# Patient Record
Sex: Male | Born: 1945 | Race: White | Hispanic: No | Marital: Single | Smoking: Former smoker
Health system: Southern US, Community
[De-identification: ages and names within clinical notes are randomized; demographics above are authoritative.]

## PROBLEM LIST (undated history)

## (undated) ENCOUNTER — Emergency Department (HOSPITAL_COMMUNITY): Discharge status: Against Medical Advice | Payer: Medicare Other

## (undated) DIAGNOSIS — F418 Other specified anxiety disorders: Secondary | ICD-10-CM

## (undated) DIAGNOSIS — R269 Unspecified abnormalities of gait and mobility: Secondary | ICD-10-CM

## (undated) DIAGNOSIS — N183 Chronic kidney disease, stage 3 (moderate): Secondary | ICD-10-CM

## (undated) DIAGNOSIS — R569 Unspecified convulsions: Secondary | ICD-10-CM

## (undated) DIAGNOSIS — E785 Hyperlipidemia, unspecified: Secondary | ICD-10-CM

## (undated) DIAGNOSIS — E1149 Type 2 diabetes mellitus with other diabetic neurological complication: Secondary | ICD-10-CM

## (undated) DIAGNOSIS — H9190 Unspecified hearing loss, unspecified ear: Secondary | ICD-10-CM

## (undated) DIAGNOSIS — IMO0001 Reserved for inherently not codable concepts without codable children: Secondary | ICD-10-CM

## (undated) DIAGNOSIS — D649 Anemia, unspecified: Secondary | ICD-10-CM

## (undated) DIAGNOSIS — F10239 Alcohol dependence with withdrawal, unspecified: Secondary | ICD-10-CM

## (undated) DIAGNOSIS — I10 Essential (primary) hypertension: Secondary | ICD-10-CM

## (undated) DIAGNOSIS — F1010 Alcohol abuse, uncomplicated: Secondary | ICD-10-CM

## (undated) HISTORY — DX: Unspecified hearing loss, unspecified ear: H91.90

## (undated) HISTORY — DX: Unspecified abnormalities of gait and mobility: R26.9

## (undated) HISTORY — DX: Unspecified convulsions: R56.9

## (undated) HISTORY — DX: Reserved for inherently not codable concepts without codable children: IMO0001

## (undated) HISTORY — DX: Hyperlipidemia, unspecified: E78.5

## (undated) HISTORY — PX: NO PAST SURGERIES: SHX2092

## (undated) HISTORY — DX: Alcohol abuse, uncomplicated: F10.10

---

## 2001-05-06 ENCOUNTER — Ambulatory Visit (HOSPITAL_COMMUNITY): Admission: RE | Admit: 2001-05-06 | Discharge: 2001-05-06 | Payer: Self-pay | Admitting: Internal Medicine

## 2001-05-06 ENCOUNTER — Encounter: Payer: Self-pay | Admitting: Internal Medicine

## 2001-05-06 ENCOUNTER — Encounter: Admission: RE | Admit: 2001-05-06 | Discharge: 2001-05-06 | Payer: Self-pay | Admitting: Internal Medicine

## 2003-10-09 ENCOUNTER — Emergency Department (HOSPITAL_COMMUNITY): Admission: AD | Admit: 2003-10-09 | Discharge: 2003-10-09 | Payer: Self-pay | Admitting: Family Medicine

## 2004-07-01 ENCOUNTER — Emergency Department (HOSPITAL_COMMUNITY): Admission: EM | Admit: 2004-07-01 | Discharge: 2004-07-02 | Payer: Self-pay | Admitting: Emergency Medicine

## 2004-10-07 ENCOUNTER — Ambulatory Visit (HOSPITAL_COMMUNITY): Admission: RE | Admit: 2004-10-07 | Discharge: 2004-10-07 | Payer: Self-pay | Admitting: Internal Medicine

## 2004-10-07 ENCOUNTER — Ambulatory Visit: Payer: Self-pay | Admitting: Internal Medicine

## 2004-10-28 ENCOUNTER — Ambulatory Visit: Payer: Self-pay | Admitting: Internal Medicine

## 2004-11-11 ENCOUNTER — Ambulatory Visit: Payer: Self-pay | Admitting: Internal Medicine

## 2005-01-20 ENCOUNTER — Ambulatory Visit: Payer: Self-pay | Admitting: Internal Medicine

## 2005-01-24 ENCOUNTER — Ambulatory Visit: Payer: Self-pay | Admitting: Internal Medicine

## 2005-05-08 ENCOUNTER — Emergency Department (HOSPITAL_COMMUNITY): Admission: EM | Admit: 2005-05-08 | Discharge: 2005-05-08 | Payer: Self-pay | Admitting: *Deleted

## 2005-05-13 ENCOUNTER — Emergency Department (HOSPITAL_COMMUNITY): Admission: EM | Admit: 2005-05-13 | Discharge: 2005-05-13 | Payer: Self-pay | Admitting: Emergency Medicine

## 2005-08-08 ENCOUNTER — Ambulatory Visit: Payer: Self-pay | Admitting: Internal Medicine

## 2005-09-08 ENCOUNTER — Emergency Department (HOSPITAL_COMMUNITY): Admission: EM | Admit: 2005-09-08 | Discharge: 2005-09-08 | Payer: Self-pay | Admitting: Family Medicine

## 2005-09-10 ENCOUNTER — Emergency Department (HOSPITAL_COMMUNITY): Admission: EM | Admit: 2005-09-10 | Discharge: 2005-09-10 | Payer: Self-pay | Admitting: Emergency Medicine

## 2005-09-27 ENCOUNTER — Emergency Department (HOSPITAL_COMMUNITY): Admission: EM | Admit: 2005-09-27 | Discharge: 2005-09-27 | Payer: Self-pay | Admitting: Family Medicine

## 2005-11-18 ENCOUNTER — Ambulatory Visit: Payer: Self-pay | Admitting: Internal Medicine

## 2005-12-09 ENCOUNTER — Ambulatory Visit (HOSPITAL_COMMUNITY): Admission: RE | Admit: 2005-12-09 | Discharge: 2005-12-09 | Payer: Self-pay | Admitting: Hospitalist

## 2005-12-09 ENCOUNTER — Ambulatory Visit: Payer: Self-pay | Admitting: Hospitalist

## 2005-12-10 ENCOUNTER — Ambulatory Visit: Payer: Self-pay | Admitting: Internal Medicine

## 2005-12-15 ENCOUNTER — Ambulatory Visit: Payer: Self-pay | Admitting: Internal Medicine

## 2005-12-19 ENCOUNTER — Ambulatory Visit: Payer: Self-pay | Admitting: Internal Medicine

## 2005-12-25 ENCOUNTER — Ambulatory Visit: Payer: Self-pay | Admitting: Internal Medicine

## 2006-01-28 ENCOUNTER — Ambulatory Visit: Payer: Self-pay | Admitting: Internal Medicine

## 2006-02-10 ENCOUNTER — Ambulatory Visit: Payer: Self-pay | Admitting: Internal Medicine

## 2006-08-11 ENCOUNTER — Encounter (INDEPENDENT_AMBULATORY_CARE_PROVIDER_SITE_OTHER): Payer: Self-pay | Admitting: Internal Medicine

## 2006-08-11 ENCOUNTER — Ambulatory Visit: Payer: Self-pay | Admitting: Hospitalist

## 2006-08-11 LAB — CONVERTED CEMR LAB
Alkaline Phosphatase: 53 units/L (ref 39–117)
Creatinine, Ser: 1 mg/dL (ref 0.40–1.50)
Glucose, Bld: 131 mg/dL — ABNORMAL HIGH (ref 70–99)
Sodium: 141 meq/L (ref 135–145)
Total Bilirubin: 0.5 mg/dL (ref 0.3–1.2)
Total Protein: 7.2 g/dL (ref 6.0–8.3)

## 2006-08-18 ENCOUNTER — Ambulatory Visit: Payer: Self-pay | Admitting: Internal Medicine

## 2006-08-18 ENCOUNTER — Encounter (INDEPENDENT_AMBULATORY_CARE_PROVIDER_SITE_OTHER): Payer: Self-pay | Admitting: Internal Medicine

## 2006-08-18 LAB — CONVERTED CEMR LAB
BUN: 6 mg/dL (ref 6–23)
CO2: 34 meq/L — ABNORMAL HIGH (ref 19–32)
Chloride: 99 meq/L (ref 96–112)
Glucose, Bld: 177 mg/dL — ABNORMAL HIGH (ref 70–99)
Potassium: 3.5 meq/L (ref 3.5–5.3)

## 2006-09-30 DIAGNOSIS — I10 Essential (primary) hypertension: Secondary | ICD-10-CM

## 2006-09-30 DIAGNOSIS — E119 Type 2 diabetes mellitus without complications: Secondary | ICD-10-CM | POA: Insufficient documentation

## 2006-09-30 DIAGNOSIS — R51 Headache: Secondary | ICD-10-CM

## 2006-09-30 DIAGNOSIS — G43909 Migraine, unspecified, not intractable, without status migrainosus: Secondary | ICD-10-CM | POA: Insufficient documentation

## 2006-09-30 DIAGNOSIS — E785 Hyperlipidemia, unspecified: Secondary | ICD-10-CM | POA: Insufficient documentation

## 2006-09-30 DIAGNOSIS — F329 Major depressive disorder, single episode, unspecified: Secondary | ICD-10-CM

## 2006-09-30 DIAGNOSIS — Z8669 Personal history of other diseases of the nervous system and sense organs: Secondary | ICD-10-CM | POA: Insufficient documentation

## 2006-09-30 DIAGNOSIS — IMO0001 Reserved for inherently not codable concepts without codable children: Secondary | ICD-10-CM | POA: Insufficient documentation

## 2006-09-30 DIAGNOSIS — F411 Generalized anxiety disorder: Secondary | ICD-10-CM

## 2006-09-30 DIAGNOSIS — M1990 Unspecified osteoarthritis, unspecified site: Secondary | ICD-10-CM | POA: Insufficient documentation

## 2006-09-30 DIAGNOSIS — F8189 Other developmental disorders of scholastic skills: Secondary | ICD-10-CM | POA: Insufficient documentation

## 2006-09-30 DIAGNOSIS — R519 Headache, unspecified: Secondary | ICD-10-CM | POA: Insufficient documentation

## 2006-09-30 DIAGNOSIS — H9190 Unspecified hearing loss, unspecified ear: Secondary | ICD-10-CM

## 2006-12-17 ENCOUNTER — Telehealth: Payer: Self-pay | Admitting: *Deleted

## 2007-03-17 ENCOUNTER — Encounter (INDEPENDENT_AMBULATORY_CARE_PROVIDER_SITE_OTHER): Payer: Self-pay | Admitting: Internal Medicine

## 2007-03-22 ENCOUNTER — Telehealth: Payer: Self-pay | Admitting: *Deleted

## 2007-03-29 ENCOUNTER — Encounter (INDEPENDENT_AMBULATORY_CARE_PROVIDER_SITE_OTHER): Payer: Self-pay | Admitting: *Deleted

## 2007-04-28 ENCOUNTER — Telehealth (INDEPENDENT_AMBULATORY_CARE_PROVIDER_SITE_OTHER): Payer: Self-pay | Admitting: *Deleted

## 2007-05-17 ENCOUNTER — Telehealth (INDEPENDENT_AMBULATORY_CARE_PROVIDER_SITE_OTHER): Payer: Self-pay | Admitting: Pharmacy Technician

## 2007-07-30 ENCOUNTER — Telehealth (INDEPENDENT_AMBULATORY_CARE_PROVIDER_SITE_OTHER): Payer: Self-pay | Admitting: *Deleted

## 2007-09-03 ENCOUNTER — Telehealth: Payer: Self-pay | Admitting: *Deleted

## 2007-09-03 ENCOUNTER — Telehealth (INDEPENDENT_AMBULATORY_CARE_PROVIDER_SITE_OTHER): Payer: Self-pay | Admitting: *Deleted

## 2007-09-10 ENCOUNTER — Encounter (INDEPENDENT_AMBULATORY_CARE_PROVIDER_SITE_OTHER): Payer: Self-pay | Admitting: *Deleted

## 2007-09-30 DIAGNOSIS — F1010 Alcohol abuse, uncomplicated: Secondary | ICD-10-CM

## 2007-09-30 HISTORY — DX: Alcohol abuse, uncomplicated: F10.10

## 2008-01-14 ENCOUNTER — Encounter (INDEPENDENT_AMBULATORY_CARE_PROVIDER_SITE_OTHER): Payer: Self-pay | Admitting: *Deleted

## 2008-03-02 ENCOUNTER — Telehealth (INDEPENDENT_AMBULATORY_CARE_PROVIDER_SITE_OTHER): Payer: Self-pay | Admitting: *Deleted

## 2008-03-29 ENCOUNTER — Encounter (INDEPENDENT_AMBULATORY_CARE_PROVIDER_SITE_OTHER): Payer: Self-pay | Admitting: Internal Medicine

## 2008-03-29 ENCOUNTER — Ambulatory Visit: Payer: Self-pay | Admitting: Internal Medicine

## 2008-03-29 LAB — CONVERTED CEMR LAB
Alkaline Phosphatase: 62 units/L (ref 39–117)
BUN: 13 mg/dL (ref 6–23)
CO2: 21 meq/L (ref 19–32)
Cholesterol: 187 mg/dL (ref 0–200)
Creatinine, Ser: 1.26 mg/dL (ref 0.40–1.50)
Glucose, Bld: 198 mg/dL — ABNORMAL HIGH (ref 70–99)
HDL: 53 mg/dL (ref >39)
LDL Cholesterol: 103 mg/dL — ABNORMAL HIGH (ref 0–99)
Total Bilirubin: 1.4 mg/dL — ABNORMAL HIGH (ref 0.3–1.2)
Triglycerides: 157 mg/dL — ABNORMAL HIGH (ref <150)
VLDL: 31 mg/dL (ref 0–40)

## 2008-06-08 ENCOUNTER — Telehealth: Payer: Self-pay | Admitting: *Deleted

## 2008-07-03 ENCOUNTER — Telehealth: Payer: Self-pay | Admitting: *Deleted

## 2008-10-02 ENCOUNTER — Telehealth (INDEPENDENT_AMBULATORY_CARE_PROVIDER_SITE_OTHER): Payer: Self-pay | Admitting: *Deleted

## 2008-10-02 ENCOUNTER — Encounter (INDEPENDENT_AMBULATORY_CARE_PROVIDER_SITE_OTHER): Payer: Self-pay | Admitting: Internal Medicine

## 2008-10-17 ENCOUNTER — Telehealth (INDEPENDENT_AMBULATORY_CARE_PROVIDER_SITE_OTHER): Payer: Self-pay | Admitting: Internal Medicine

## 2008-12-08 ENCOUNTER — Encounter (INDEPENDENT_AMBULATORY_CARE_PROVIDER_SITE_OTHER): Payer: Self-pay | Admitting: Internal Medicine

## 2009-03-10 ENCOUNTER — Emergency Department (HOSPITAL_COMMUNITY): Admission: EM | Admit: 2009-03-10 | Discharge: 2009-03-12 | Payer: Self-pay | Admitting: Emergency Medicine

## 2009-03-12 ENCOUNTER — Telehealth (INDEPENDENT_AMBULATORY_CARE_PROVIDER_SITE_OTHER): Payer: Self-pay | Admitting: Internal Medicine

## 2009-05-17 ENCOUNTER — Ambulatory Visit: Payer: Self-pay | Admitting: Internal Medicine

## 2009-05-17 DIAGNOSIS — R21 Rash and other nonspecific skin eruption: Secondary | ICD-10-CM

## 2009-05-17 DIAGNOSIS — R310 Gross hematuria: Secondary | ICD-10-CM

## 2009-05-22 LAB — CONVERTED CEMR LAB
ALT: 38 units/L (ref 0–53)
Bilirubin Urine: NEGATIVE
CO2: 22 meq/L (ref 19–32)
Calcium: 10 mg/dL (ref 8.4–10.5)
Chloride: 98 meq/L (ref 96–112)
Cholesterol: 166 mg/dL (ref 0–200)
Creatinine, Ser: 0.96 mg/dL (ref 0.40–1.50)
Glucose, Bld: 112 mg/dL — ABNORMAL HIGH (ref 70–99)
Ketones, ur: NEGATIVE mg/dL
Specific Gravity, Urine: 1.015 (ref 1.005–1.0)
Total Bilirubin: 0.8 mg/dL (ref 0.3–1.2)
Total CHOL/HDL Ratio: 3
Total Protein: 7.6 g/dL (ref 6.0–8.3)
Triglycerides: 154 mg/dL — ABNORMAL HIGH (ref <150)
Urine Glucose: 100 mg/dL — AB
Urobilinogen, UA: 0.2 (ref 0.0–1.0)
VLDL: 31 mg/dL (ref 0–40)

## 2009-05-31 ENCOUNTER — Ambulatory Visit: Payer: Self-pay | Admitting: Internal Medicine

## 2009-05-31 LAB — CONVERTED CEMR LAB: Blood Glucose, Fingerstick: 179

## 2009-06-08 ENCOUNTER — Encounter: Payer: Self-pay | Admitting: Gastroenterology

## 2009-06-14 ENCOUNTER — Ambulatory Visit: Payer: Self-pay | Admitting: Infectious Diseases

## 2009-06-14 LAB — CONVERTED CEMR LAB: Blood Glucose, Fingerstick: 142

## 2009-06-15 ENCOUNTER — Encounter: Payer: Self-pay | Admitting: Licensed Clinical Social Worker

## 2009-06-15 ENCOUNTER — Telehealth: Payer: Self-pay | Admitting: Licensed Clinical Social Worker

## 2009-07-31 ENCOUNTER — Encounter: Payer: Self-pay | Admitting: Infectious Disease

## 2009-09-29 DIAGNOSIS — D649 Anemia, unspecified: Secondary | ICD-10-CM

## 2009-09-29 HISTORY — DX: Anemia, unspecified: D64.9

## 2009-10-01 ENCOUNTER — Telehealth (INDEPENDENT_AMBULATORY_CARE_PROVIDER_SITE_OTHER): Payer: Self-pay | Admitting: Internal Medicine

## 2009-10-04 ENCOUNTER — Telehealth (INDEPENDENT_AMBULATORY_CARE_PROVIDER_SITE_OTHER): Payer: Self-pay | Admitting: Internal Medicine

## 2009-10-21 ENCOUNTER — Inpatient Hospital Stay (HOSPITAL_COMMUNITY): Admission: EM | Admit: 2009-10-21 | Discharge: 2009-10-25 | Payer: Self-pay | Admitting: Emergency Medicine

## 2009-12-03 ENCOUNTER — Ambulatory Visit: Payer: Self-pay | Admitting: Internal Medicine

## 2009-12-03 DIAGNOSIS — F1011 Alcohol abuse, in remission: Secondary | ICD-10-CM

## 2009-12-03 LAB — CONVERTED CEMR LAB
ALT: 15 units/L (ref 0–53)
AST: 18 units/L (ref 0–37)
Albumin: 4.6 g/dL (ref 3.5–5.2)
Alkaline Phosphatase: 55 units/L (ref 39–117)
Blood Glucose, Fingerstick: 135
Creatinine, Urine: 61.7 mg/dL
LDL Cholesterol: 93 mg/dL (ref 0–99)
MCHC: 32.7 g/dL (ref 30.0–36.0)
MCV: 99.5 fL (ref >=78.0)
Microalb, Ur: 0.8 mg/dL (ref 0.00–1.89)
Platelets: 206 10*3/uL (ref 150–400)
Potassium: 4.5 meq/L (ref 3.5–5.3)
RDW: 13.5 % (ref 11.5–15.5)
Sodium: 135 meq/L (ref 135–145)
TSH: 1.869 microintl units/mL (ref 0.350–4.5)
Total Bilirubin: 0.5 mg/dL (ref 0.3–1.2)
Total Protein: 6.9 g/dL (ref 6.0–8.3)
Triglycerides: 194 mg/dL — ABNORMAL HIGH (ref <150)
VLDL: 39 mg/dL (ref 0–40)

## 2009-12-04 ENCOUNTER — Encounter (INDEPENDENT_AMBULATORY_CARE_PROVIDER_SITE_OTHER): Payer: Self-pay | Admitting: Internal Medicine

## 2009-12-05 ENCOUNTER — Telehealth: Payer: Self-pay | Admitting: Licensed Clinical Social Worker

## 2010-01-10 ENCOUNTER — Encounter (INDEPENDENT_AMBULATORY_CARE_PROVIDER_SITE_OTHER): Payer: Self-pay | Admitting: Internal Medicine

## 2010-03-05 ENCOUNTER — Ambulatory Visit: Payer: Self-pay | Admitting: Internal Medicine

## 2010-03-06 ENCOUNTER — Telehealth (INDEPENDENT_AMBULATORY_CARE_PROVIDER_SITE_OTHER): Payer: Self-pay | Admitting: Internal Medicine

## 2010-03-06 ENCOUNTER — Encounter: Payer: Self-pay | Admitting: Licensed Clinical Social Worker

## 2010-03-06 LAB — CONVERTED CEMR LAB
AST: 20 units/L (ref 0–37)
Alkaline Phosphatase: 58 units/L (ref 39–117)
Glucose, Bld: 198 mg/dL — ABNORMAL HIGH (ref 70–99)
Potassium: 3.7 meq/L (ref 3.5–5.3)
Sodium: 136 meq/L (ref 135–145)
Total Bilirubin: 0.7 mg/dL (ref 0.3–1.2)
Total Protein: 6.7 g/dL (ref 6.0–8.3)

## 2010-05-02 ENCOUNTER — Telehealth: Payer: Self-pay | Admitting: Internal Medicine

## 2010-05-07 ENCOUNTER — Telehealth: Payer: Self-pay | Admitting: Ophthalmology

## 2010-06-06 ENCOUNTER — Ambulatory Visit: Payer: Self-pay | Admitting: Internal Medicine

## 2010-06-06 ENCOUNTER — Telehealth: Payer: Self-pay | Admitting: Internal Medicine

## 2010-06-06 DIAGNOSIS — R42 Dizziness and giddiness: Secondary | ICD-10-CM

## 2010-06-06 LAB — CONVERTED CEMR LAB: Blood Glucose, Fingerstick: 176

## 2010-06-07 LAB — CONVERTED CEMR LAB
BUN: 8 mg/dL (ref 6–23)
CO2: 29 meq/L (ref 19–32)
Calcium: 8.9 mg/dL (ref 8.4–10.5)
Chloride: 94 meq/L — ABNORMAL LOW (ref 96–112)
Creatinine, Ser: 0.93 mg/dL (ref 0.40–1.50)
Glucose, Bld: 174 mg/dL — ABNORMAL HIGH (ref 70–99)
HCT: 38.7 % — ABNORMAL LOW (ref 39.0–52.0)
Hemoglobin: 12.8 g/dL — ABNORMAL LOW (ref 13.0–17.0)
MCV: 97.7 fL (ref >=78.0)
Potassium: 3.7 meq/L (ref 3.5–5.3)
RBC: 3.96 M/uL — ABNORMAL LOW (ref 4.22–5.81)
RDW: 14.6 % (ref 11.5–15.5)
WBC: 6.6 10*3/uL (ref 4.0–10.5)

## 2010-10-09 ENCOUNTER — Telehealth: Payer: Self-pay | Admitting: Internal Medicine

## 2010-10-15 ENCOUNTER — Telehealth: Payer: Self-pay | Admitting: *Deleted

## 2010-10-29 NOTE — Progress Notes (Signed)
Summary: refill/ hla  Phone Note Refill Request Message from:  Patient on March 06, 2010 5:03 PM  Refills Requested: Medication #1:  FLUOXETINE HCL 40 MG CAPS Take 1 capsule by mouth once a day please order 46mon supply  Initial call taken by: Freddy Finner RN,  March 06, 2010 5:04 PM  Follow-up for Phone Call       Follow-up by: Flo Shanks MD,  March 07, 2010 8:43 AM    Prescriptions: FLUOXETINE HCL 40 MG CAPS (FLUOXETINE HCL) Take 1 capsule by mouth once a day  #30 Each x 0   Entered and Authorized by:   Flo Shanks MD   Signed by:   Flo Shanks MD on 03/07/2010   Method used:   Electronically to        [facility] (retail)       [address], [place]  [id number]       Ph: [phone]       Fax: [phone]   RxID:   [id number]

## 2010-10-29 NOTE — Assessment & Plan Note (Signed)
Summary: Social Work  Met with patient in exam room.  Gave him information about [facility] substance [facility] programs.  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  I encouraged him to call me or [facility] if he would like support and counseling re: his drinking problem.

## 2010-10-29 NOTE — Letter (Signed)
Summary: Generic Letter  [facility]  [address]   [place], [place] [address]   Phone: [phone]  Fax: [phone]    06/06/2010  MASSIE SILENCE [address] [place], [place]  [address]  Dear Mr. LISSNER,           Sincerely,   Larey Dresser MD

## 2010-10-29 NOTE — Progress Notes (Signed)
Summary: Walk in  Phone Note Call from Patient   Caller: Patient Call For: William Sarin MD Summary of Call: Pt walked in says that his new Medicine Atenolol makes him sick.  Feels that some of his other med may be making him sick.  Med makes him feel dizzy.  Loses his since of direction. Says that he needs a letter to be able to keep her dog.  Says that the dog helps him to stay safe.  Dog will go to the door when hears a stranger.  Stes he gets dizzy when standing.  B/P 162/96  P-68 sitting.  B/P 150/95 P-82.  Glucose 201.  Ate a [place] sandwich and a tomato.  [place] because he got dizzy last week.  Pt brought in all of his meds for review. Sander Nephew RN  June 06, 2010 2:55 PM     Initial call taken by: Sander Nephew RN,  June 06, 2010 2:28 PM  Follow-up for Phone Call        Reviewed meds and updated med list. Threw away second bottle of metformin and fluoxetine. See note below Follow-up by: Larey Dresser MD,  June 06, 2010 3:00 PM  New Problems: DIZZINESS (ICD-780.4)   New Problems: DIZZINESS (ICD-780.4)  Process Orders Check Orders Results:     [facility]: [id number] not required for this insurance Tests Sent for requisitioning (June 07, 2010 2:13 PM):     06/06/2010: [facility] -- T-Basic Metabolic Panel [id number] (signed)     06/06/2010: [facility] -- T-CBC No Diff [id number] (signed)      Laboratory Results   Blood Tests   Date/Time Received: June 06, 2010 3:56 PM  Date/Time Reported: Lenoria Farrier  June 06, 2010 3:56 PM   HGBA1C: 5.9%   (Normal Range: Non-Diabetic - 3-6%   Control Diabetic - 6-8%) CBG Random:: 176mg /dL       Past History:  Past Medical History: Last updated: 09/30/2006 Anxiety Depression Diabetes mellitus, type II Headache, migraine Hyperlipidemia Hypertension Osteoarthritis Cataracts Hearing Impaired Learning Impaired   Social  History: 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 Has a little dog, Rica Mote, that keeps him company. Has male friend that checks in on him daily and helps drive him. Otherwise takes the bus.   Medications Prior to Update: 1)  Hydrochlorothiazide 25 Mg Tabs (Hydrochlorothiazide) .... Take 1 Tablet By Mouth Once A Day 2)  Atenolol 100 Mg Tabs (Atenolol) .... Take 1 Tablet By Mouth Once A Day 3)  Lisinopril 20 Mg Tabs (Lisinopril) .... Take 1 Tablet By Mouth Once A Day 4)  Fluoxetine Hcl 40 Mg Caps (Fluoxetine Hcl) .... Take 1 Capsule By Mouth Once A Day 5)  Metformin Hcl 1000 Mg Tabs (Metformin Hcl) .... Take 1 Pill By Mouth Two Times A Day. 6)  Zocor 20 Mg Tabs (Simvastatin) .... Take 1 Pill By Mouth Daily. 7)  Diphenhydramine Hcl 50 Mg Caps (Diphenhydramine Hcl) .... Take 1 Capsule Every 4-6 Hrs As Needed For Itching. 8)  Catapres 0.1 Mg Tabs (Clonidine Hcl) .... Take 1 Pill By Mouth Two Times A Day. 9)  Nicoderm Cq 14 Mg/24hr Pt24 (Nicotine) .... Apply One Patch Daily On Your Skin  Current Medications (verified): 1)  Hydrochlorothiazide 25 Mg Tabs (Hydrochlorothiazide) .... Take 1 Tablet By Mouth Once A Day 2)  Atenolol 100  Mg Tabs (Atenolol) .... Take 1 Tablet By Mouth Once A Day 3)  Lisinopril 20 Mg Tabs (Lisinopril) .... Take 1 Tablet By Mouth Once A Day 4)  Fluoxetine Hcl 40 Mg Caps (Fluoxetine Hcl) .... Take 1 Capsule By Mouth Once A Day 5)  Metformin Hcl 1000 Mg Tabs (Metformin Hcl) .... Take 1 Pill By Mouth Two Times A Day. 6)  Zocor 20 Mg Tabs (Simvastatin) .... Take 1 Pill By Mouth Daily. 7)  Catapres 0.1 Mg Tabs (Clonidine Hcl) .... Take 1 Pill By Mouth Two Times A Day.  Allergies: No Known Drug Allergies   Physical Exam  General:  alert, well-developed, disheveled, and poor hygiene.   Head:  normocephalic and atraumatic.   Ears:  R ear normal and L ear normal.   Nose:  no  external deformity and nose piercing noted.   Lungs:  normal respiratory effort, no accessory muscle use, normal breath sounds, no crackles, and no wheezes.   Heart:  normal rate, regular rhythm, no murmur, and no gallop.   Msk:  normal ROM.   Neurologic:  alert & oriented X3 and abnormal gait.  Required use of cane and hands to stand. Used cane to ambulate but no toe drop fairly stable. Able to stand for several minutes while talking.  Skin:  Psoriasis like changes head and extremities. Abrasion on R elbow healing.  Psych:  Oriented X3, memory intact for recent and remote, normally interactive, good eye contact, not anxious appearing, and not depressed appearing.     History of Present Illness: Pt walked into clinic today to get note to keep his dog in his apartment. C/O dizziness. See A/P   Impression & Recommendations:  Problem # 1:  DIZZINESS (ICD-780.4) This is a chronic problem and comes and goes. Not particulary worse today. No CP, SOB. Pt not able to state how long it lasts and how freq it comes. Usually when he stands up. Despite the dizziness he is able to walk his dog. He tripped last week and scrapped his elbows. Rica Mote licked them and now they are healing. Thinks it might be worse now that the BB was added. Gladys and I reviewed his meds and had duplicate bottles of metformin and SSRI. He is eating and drinking OK. He was able to travel here on the bus.  On exam, not orthostatic, not hypoglycemic, was able to stand and walk with assistance if cane only.   I do not  think that there is any life treateniong cause of dizziness today. I did not decrease his atenolol as he is not orthostatic and has been on this med for years. This is a chronic problem and will F/U next week. Basic blood work to R/U anemia, electrolyte abnl. Threw away duplicate med bottles and asked Regino Schultze to see if pt thought a pill box would help.   The following medications were removed from the medication list:     Diphenhydramine Hcl 50 Mg Caps (Diphenhydramine hcl) .Marland Kitchen... Take 1 capsule every 4-6 hrs as needed for itching.  Orders: T-CBC No Diff [id number])  Problem # 2:  DIABETES MELLITUS, TYPE II (ICD-250.00) He is due for A1C - have ordered and can FU results at next visit. I threw away his duplicate bottle of metformin  His updated medication list for this problem includes:    Lisinopril 20 Mg Tabs (Lisinopril) .Marland Kitchen... Take 1 tablet by mouth once a day    Metformin Hcl 1000 Mg Tabs (Metformin hcl) .Marland Kitchen... Take 1  pill by mouth two times a day.  Orders: T- Capillary Blood Glucose [id number]) T-Hgb A1C (in-house) ([id number]) T-Basic Metabolic Panel ([id number])  Problem # 3:  DEPRESSION (ICD-311) I threw away his duplicate bottle of fluoxetine. I wrote a letter to allow Dr Astrid Divine to keep Rica Mote.  His updated medication list for this problem includes:    Fluoxetine Hcl 40 Mg Caps (Fluoxetine hcl) .Marland Kitchen... Take 1 capsule by mouth once a day  Complete Medication List: 1)  Hydrochlorothiazide 25 Mg Tabs (Hydrochlorothiazide) .... Take 1 tablet by mouth once a day 2)  Atenolol 100 Mg Tabs (Atenolol) .... Take 1 tablet by mouth once a day 3)  Lisinopril 20 Mg Tabs (Lisinopril) .... Take 1 tablet by mouth once a day 4)  Fluoxetine Hcl 40 Mg Caps (Fluoxetine hcl) .... Take 1 capsule by mouth once a day 5)  Metformin Hcl 1000 Mg Tabs (Metformin hcl) .... Take 1 pill by mouth two times a day. 6)  Zocor 20 Mg Tabs (Simvastatin) .... Take 1 pill by mouth daily. 7)  Catapres 0.1 Mg Tabs (Clonidine hcl) .... Take 1 pill by mouth two times a day.   Patient Instructions: 1)  Follow up next week.   Review of Systems General:  Denies chills, fever, and loss of appetite. ENT:  Denies difficulty swallowing and sinus pressure. CV:  Denies chest pain or discomfort, fainting, near fainting, palpitations, and shortness of breath with exertion. Resp:  Denies chest discomfort and cough. GI:  Denies abdominal pain,  constipation, diarrhea, and loss of appetite. Neuro:  Complains of falling down and tremors; dizziness.

## 2010-10-29 NOTE — Letter (Signed)
[facility]: [facility]   Imported By: Garlan Fillers 01/25/2010 14:57:20  _____________________________________________________________________  External Attachment:    Type:   Image     Comment:   External Document

## 2010-10-29 NOTE — Progress Notes (Signed)
Summary: Refll and appointment  Phone Note Outgoing Call   Call placed by: Sander Nephew RN,  October 04, 2009 10:12 AM Call placed to: Patient Summary of Call: Attempts to call pt # given is for [facility].  Pt no longer there.  Message pt on refill sheet and faxed back to pt's pharmacy to call the Clinics to set up an appointment. Initial call taken by: Sander Nephew RN,  October 04, 2009 10:14 AM

## 2010-10-29 NOTE — Progress Notes (Signed)
Summary: phone/gg  Phone Note From Other Clinic   Summary of Call: Call from Dr Vickey Sages office stating pt was referred to them from [place].  He had a scheduled appointment  on july 29th.  Pt did not show up for appointment. Call from [facility] #  [phone]   for questions. Initial call taken by: Gevena Cotton RN,  May 07, 2010 11:49 AM  Follow-up for Phone Call        Dr.  Zenia Resides office was contacted.  They have been unable to contact the patient to reschedule the appointment.  The importance of an eye exam should be readressed at the next visit with his PCP and referal made.

## 2010-10-29 NOTE — Progress Notes (Signed)
Summary: med refill/gp  Phone Note Refill Request Message from:  Fax from Pharmacy on May 02, 2010 10:38 AM  Refills Requested: Medication #1:  FLUOXETINE HCL 40 MG CAPS Take 1 capsule by mouth once a day   Last Refilled: 03/15/2010 Last appt. March 05 2010.   Method Requested: Electronic Initial call taken by: Morrison Old RN,  May 02, 2010 10:38 AM  Follow-up for Phone Call        Seen in June. Was supposed to F/U one month. Will send flag to Ms Cyndi Bender to F/U one to two months. Follow-up by: Larey Dresser MD,  May 02, 2010 12:16 PM    Prescriptions: FLUOXETINE HCL 40 MG CAPS (FLUOXETINE HCL) Take 1 capsule by mouth once a day  #31 x 1   Entered and Authorized by:   Larey Dresser MD   Signed by:   Larey Dresser MD on 05/02/2010   Method used:   Electronically to        [facility] (retail)       [address], [place]  [id number]       Ph: [phone]       Fax: [phone]   RxID:   [id number]
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
## 2010-10-29 NOTE — Letter (Signed)
Summary: [employer]   [employer]   Imported By: Bonner Puna 12/24/2009 16:34:27  _____________________________________________________________________  External Attachment:    Type:   Image     Comment:   External Document

## 2010-10-29 NOTE — Assessment & Plan Note (Signed)
Summary: TO SEE DR Redmond Pulling PER HELEN FOR OV/CH   Vital Signs:  Patient profile:   65 year old male Height:      68 inches (172.72 cm) Weight:      215.5 pounds (97.95 kg) BMI:     32.89 Temp:     96.7 degrees F (35.94 degrees C) oral Pulse rate:   60 / minute BP sitting:   144 / 92  (right arm)  Vitals Entered By: Hilda Blades Ditzler RN (March 05, 2010 12:13 PM) Is Patient Diabetic? Yes Did you bring your meter with you today? No Pain Assessment Patient in pain? no      Nutritional Status BMI of > 30 = obese Nutritional Status Detail appetite good  Have you ever been in a relationship where you felt threatened, hurt or afraid?denies   Does patient need assistance? Functional Status Self care Ambulation Impaired:Risk for fall Comments Uses a cane. Refills on meds - out of meds6/4/11.   History of Present Illness: William [name] comes for a f/u visit.   1. DM: He is taking metformin once a day. He doesnot check blood sugar at home. He is supposed to see an eye MD in July.   2. HTN;He is taking all his medications except HCTZ. But, otherwise he ran out of all his meds 3 days ago and has not taken them since then.   3. HL:He started taking zocor.   4. Alcohal abuse:He drinks about 12 packs of beer every 2 days. But he says he has started to drink his meds as he ran out of his meds few days ago. Otherwise since last visit he has not been drinking regularly.   5. Depression/anxiety: Mood is good. No SI.   6. He is chewing tobacco since childhood. Nowadays, he doesnot chew a much as he used to do.   Depression History:      The patient denies a depressed mood most of the day and a diminished interest in his usual daily activities.         Preventive Screening-Counseling & Management  Alcohol-Tobacco     Alcohol type: VODKA - AT TIMES     >5/day in last 3 mos: last drink 2 wks ago     Smoking Status: quit     Smoking Cessation Counseling: yes     Cans of tobacco/week:  yes  Caffeine-Diet-Exercise     Does Patient Exercise: no  Current Medications (verified): 1)  Hydrochlorothiazide 25 Mg Tabs (Hydrochlorothiazide) .... Take 1 Tablet By Mouth Once A Day 2)  Atenolol 100 Mg Tabs (Atenolol) .... Take 1 Tablet By Mouth Once A Day 3)  Lisinopril 20 Mg Tabs (Lisinopril) .... Take 1 Tablet By Mouth Once A Day 4)  Fluoxetine Hcl 40 Mg Caps (Fluoxetine Hcl) .... Take 1 Capsule By Mouth Once A Day 5)  Metformin Hcl 500 Mg  Tabs (Metformin Hcl) .... Take 1 Pill By Mouth Daily. 6)  Zocor 20 Mg Tabs (Simvastatin) .... Take 1 Pill By Mouth Daily. 7)  Diphenhydramine Hcl 50 Mg Caps (Diphenhydramine Hcl) .... Take 1 Capsule Every 4-6 Hrs As Needed For Itching. 8)  Catapres 0.1 Mg Tabs (Clonidine Hcl) .... Take 1 Pill By Mouth Two Times A Day. 9)  Nicoderm Cq 14 Mg/24hr Pt24 (Nicotine) .... Apply One Patch Daily On Your Skin  Allergies: No Known Drug Allergies  Review of Systems      See HPI  Physical Exam  Mouth:  pharynx pink and moist.  pharynx pink and moist.   Lungs:  normal breath sounds, no crackles, and no wheezes.  normal breath sounds, no crackles, and no wheezes.   Heart:  normal rate, regular rhythm, no murmur, and no gallop.  normal rate, regular rhythm, no murmur, and no gallop.   Extremities:  trace left pedal edema and trace right pedal edema.  trace left pedal edema.   Neurologic:  alert & oriented X3.  alert & oriented X3.     Impression & Recommendations:  Problem # 1:  ALCOHOL ABUSE (ICD-305.00) He has not drank as he used to at last appt. SW with him to help with community resources to help quit.   Problem # 2:  HYPERTENSION (ICD-401.9)  He has not taken all of his meds for the past 3 days. In addition, he is not taking HCTZ at all. I sent prescription of HCTZ and he has refills for the rest. Will check CMET.   His updated medication list for this problem includes:    Hydrochlorothiazide 25 Mg Tabs (Hydrochlorothiazide) .Marland Kitchen... Take 1  tablet by mouth once a day    Atenolol 100 Mg Tabs (Atenolol) .Marland Kitchen... Take 1 tablet by mouth once a day    Lisinopril 20 Mg Tabs (Lisinopril) .Marland Kitchen... Take 1 tablet by mouth once a day    Catapres 0.1 Mg Tabs (Clonidine hcl) .Marland Kitchen... Take 1 pill by mouth two times a day.  Orders: T-CMP with Estimated GFR ([id number]) T-Comprehensive Metabolic Panel (A999333)  BP today: 144/92 Prior BP: 163/97 (12/03/2009)  Labs Reviewed: K+: 4.5 (12/03/2009) Creat: : 0.74 (12/03/2009)   Chol: 176 (12/03/2009)   HDL: 44 (12/03/2009)   LDL: 93 (12/03/2009)   TG: 194 (12/03/2009)  His updated medication list for this problem includes:    Hydrochlorothiazide 25 Mg Tabs (Hydrochlorothiazide) .Marland Kitchen... Take 1 tablet by mouth once a day    Atenolol 100 Mg Tabs (Atenolol) .Marland Kitchen... Take 1 tablet by mouth once a day    Lisinopril 20 Mg Tabs (Lisinopril) .Marland Kitchen... Take 1 tablet by mouth once a day    Catapres 0.1 Mg Tabs (Clonidine hcl) .Marland Kitchen... Take 1 pill by mouth two times a day.  Problem # 3:  HYPERLIPIDEMIA (B2193296.4)  Will check FLP on next appt. Check CMET today.   His updated medication list for this problem includes:    Zocor 20 Mg Tabs (Simvastatin) .Marland Kitchen... Take 1 pill by mouth daily.  Orders: T-CMP with Estimated GFR ([id number]) T-Comprehensive Metabolic Panel ([id number])  His updated medication list for this problem includes:    Zocor 20 Mg Tabs (Simvastatin) .Marland Kitchen... Take 1 pill by mouth daily.  Problem # 4:  DIABETES MELLITUS, TYPE II (ICD-250.00) Will check A1c. He doesnot check CBG at home as he doesnot have a meter. I will refer to D. Ovid Curd. He is supposed to go to Dr. Katy Fitch for his eye, but his office was closed for the lunch. SO we gave him Dr. Zenia Resides office number and he will call for an appt.  His updated medication list for this problem includes:    Lisinopril 20 Mg Tabs (Lisinopril) .Marland Kitchen... Take 1 tablet by mouth once a day    Metformin Hcl 500 Mg Tabs (Metformin hcl) .Marland Kitchen... Take 1 pill by mouth  daily.  Orders: T- Capillary Blood Glucose ([id number]) T-Hgb A1C (in-house) [id number]) Diabetic Clinic Referral (Diabetic)  Labs Reviewed: Creat: 0.74 (12/03/2009)    Reviewed HgBA1c results: 6.5 (12/03/2009)  6.1 (05/17/2009)  Problem # 5:  DEPRESSION (ICD-311) Mood is great. No  si.  His updated medication list for this problem includes:    Fluoxetine Hcl 40 Mg Caps (Fluoxetine hcl) .Marland Kitchen... Take 1 capsule by mouth once a day  Problem # 6:  TOBACCO ABUSE, CHEWING TOBACCO (ICD-305.1) Informed him about the associated risk of tobacco abuse and he is willing to try a nicotine patch. Needs f/u on this.  His updated medication list for this problem includes:    Nicoderm Cq 14 Mg/24hr Pt24 (Nicotine) .Marland Kitchen... Apply one patch daily on your skin  Complete Medication List: 1)  Hydrochlorothiazide 25 Mg Tabs (Hydrochlorothiazide) .... Take 1 tablet by mouth once a day 2)  Atenolol 100 Mg Tabs (Atenolol) .... Take 1 tablet by mouth once a day 3)  Lisinopril 20 Mg Tabs (Lisinopril) .... Take 1 tablet by mouth once a day 4)  Fluoxetine Hcl 40 Mg Caps (Fluoxetine hcl) .... Take 1 capsule by mouth once a day 5)  Metformin Hcl 500 Mg Tabs (Metformin hcl) .... Take 1 pill by mouth daily. 6)  Zocor 20 Mg Tabs (Simvastatin) .... Take 1 pill by mouth daily. 7)  Diphenhydramine Hcl 50 Mg Caps (Diphenhydramine hcl) .... Take 1 capsule every 4-6 hrs as needed for itching. 8)  Catapres 0.1 Mg Tabs (Clonidine hcl) .... Take 1 pill by mouth two times a day. 9)  Nicoderm Cq 14 Mg/24hr Pt24 (Nicotine) .... Apply one patch daily on your skin  Patient Instructions: 1)  Please schedule a follow-up appointment in 1 month.  2)  Limit your Sodium (Salt) to less than 2 grams a day(slightly less than 1/2 a teaspoon) to prevent fluid retention, swelling, or worsening of symptoms. 3)  Tobacco is very bad for your health and your loved ones! You Should stop smoking!. 4)  Stop Smoking Tips: Choose a Quit date. Cut down before  the Quit date. decide what you will do as a substitute when you feel the urge to smoke(gum,toothpick,exercise). 5)  It is important that you exercise regularly at least 20 minutes 5 times a week. If you develop chest pain, have severe difficulty breathing, or feel very tired , stop exercising immediately and seek medical attention. 6)  You need to lose weight. Consider a lower calorie diet and regular exercise.  7)  Check your Blood Pressure regularly. If it is above: you should make an appointment. Prescriptions: NICODERM CQ 14 MG/24HR PT24 (NICOTINE) apply one patch daily on your skin  #30 x 1   Entered and Authorized by:   Flo Shanks MD   Signed by:   Flo Shanks MD on 03/05/2010   Method used:   [facility]        [facility] [address] [phone]* (retail)       [address]       [name], [place]  [address]       Ph: [phone] or [phone]       Fax: [phone]   RxID:   [id number] HYDROCHLOROTHIAZIDE 25 MG TABS (HYDROCHLOROTHIAZIDE) Take 1 tablet by mouth once a day  #30 x 0   Entered and Authorized by:   Flo Shanks MD   Signed by:   Flo Shanks MD on 03/05/2010   Method used:   [facility]ically to        [facility] [address] [phone]* (retail)       [address]       [place], [place]  [place]       Ph: [phone] or [phone]       Fax: [phone]   RxID:   [id number]  Process Orders Check Orders Results:     [facility]: ABN not required for this insurance Tests Sent for requisitioning (March 05, 2010 12:36 PM):     03/05/2010: [facility] -- T-Comprehensive Metabolic Panel [id number] (signed)    Prevention & Chronic Care Immunizations   Influenza vaccine: Not documented   Influenza vaccine deferral: Deferred  (03/05/2010)   Influenza vaccine due: 05/30/2009    Tetanus booster: 05/17/2009: Td   Td booster deferral: Deferred   (03/05/2010)    Pneumococcal vaccine: Pneumovax (Medicare)  (05/17/2009)   Pneumococcal vaccine deferral: Deferred  (03/05/2010)    H. zoster vaccine: Not documented   H. zoster vaccine deferral: Deferred  (03/05/2010)  Colorectal Screening   Hemoccult: Not documented   Hemoccult action/deferral: Deferred  (03/05/2010)    Colonoscopy: Not documented   Colonoscopy action/deferral: Deferred  (03/05/2010)  Other Screening   PSA: Not documented   PSA action/deferral: Discussed-decision deferred  (03/05/2010)   Smoking status: quit  (03/05/2010)  Diabetes Mellitus   HgbA1C: 6.5  (12/03/2009)    Eye exam: Not documented    Foot exam: Not documented   Foot exam action/deferral: Deferred   High risk foot: Not documented   Foot care education: Done  (05/17/2009)    Urine microalbumin/creatinine ratio: 13.0  (12/03/2009)    Diabetes flowsheet reviewed?: Yes   Progress toward A1C goal: Unchanged  Lipids   Total Cholesterol: 176  (12/03/2009)   Lipid panel action/deferral: Lipid Panel ordered   LDL: 93  (12/03/2009)   LDL Direct: Not documented   HDL: 44  (12/03/2009)   Triglycerides: 194  (12/03/2009)    SGOT (AST): 18  (12/03/2009)   SGPT (ALT): 15  (12/03/2009) CMP ordered    Alkaline phosphatase: 55  (12/03/2009)   Total bilirubin: 0.5  (12/03/2009)    Lipid flowsheet reviewed?: Yes   Progress toward LDL goal: Improved  Hypertension   Last Blood Pressure: 144 / 92  (03/05/2010)   Serum creatinine: 0.74  (12/03/2009)   BMP action: Ordered   Serum potassium 4.5  (12/03/2009) CMP ordered     Hypertension flowsheet reviewed?: Yes   Progress toward BP goal: Unchanged  Self-Management Support :   Personal Goals (by the next clinic visit) :     Personal A1C goal: 6  (06/14/2009)     Personal blood pressure goal: 140/90  (06/14/2009)     Personal LDL goal: 70  (06/14/2009)    Patient will work on the following items until the next clinic visit to reach self-care  goals:     Medications and monitoring: take my medicines every day, bring all of my medications to every visit, examine my feet every day  (03/05/2010)     Eating: drink diet soda or water instead of juice or soda, eat more vegetables, use fresh or frozen vegetables, eat fruit for snacks and desserts  (03/05/2010)     Activity: take a 30 minute walk every day, park at the far end of the parking lot  (03/05/2010)    Diabetes self-management support: Written self-care plan, Education handout, Resources for patients handout  (03/05/2010)   Diabetes care plan printed   Diabetes education handout printed   Referred for diabetes self-mgmt training.    Hypertension self-management support: Written self-care plan, Education handout, Resources for patients handout  (03/05/2010)  Hypertension self-care plan printed.   Hypertension education handout printed    Lipid self-management support: Written self-care plan, Education handout, Resources for patients handout  (03/05/2010)   Lipid self-care plan printed.   Lipid education handout printed      Resource handout printed.  Process Orders Check Orders Results:     [facility]: [id number] not required for this insurance Tests Sent for requisitioning (March 05, 2010 12:36 PM):     03/05/2010: [facility] -- T-Comprehensive Metabolic Panel [id number] (signed)   Appended Document: Lab Order/CBG amd A1C results    Lab Visit  Laboratory Results   Blood Tests   Date/Time Received: March 05, 2010 12:50 PM Date/Time Reported: Maryan Rued  March 05, 2010 12:50 PM   HGBA1C: 9.1%   (Normal Range: Non-Diabetic - 3-6%   Control Diabetic - 6-8%) CBG Random:: 191mg /dL    Orders Today:  Noted that his A1c rose to 9.1. I will increase metformin from 500 mg once a day to 1000 mg two times a day and left a message for him. He will also see D. Ovid Curd so that she can help him to get his meters and teach him about how to check  blood sugars and other related things. Will f/u. Prescriptions: METFORMIN HCL 1000 MG TABS (METFORMIN HCL) take 1 pill by mouth two times a day.  #60 x 1   Entered and Authorized by:   Flo Shanks MD   Signed by:   Flo Shanks MD on 03/05/2010   Method used:   [facility]        [facility] [address] [phone]* (retail)       [address]       [place], [place]  [address]       Ph: [phone] or [phone]       Fax: [phone]   RxID:   [id number]

## 2010-10-29 NOTE — Progress Notes (Signed)
Summary: Soc. Work  Dealer placed by: Social Work Call placed to: Patient Summary of Call: Left message for patient to contact social work.

## 2010-10-29 NOTE — Letter (Signed)
Summary: Generic Letter  [facility]  [address]   [place], [place] [address]   Phone: [phone]  Fax: [phone]    06/06/2010  EMBER PULLUM [address] [place], [place]  [address]  To Whom it May Concern,   Mr. BRENDEL lives alone and has his dog, Rica Mote, for company. Mr Dinucci is being treated for depression and Rica Mote provides companionship for him. Rica Mote is also important in keeping Mr Kintner active, which is an important component of his treatment plan.     Sincerely,     Larey Dresser MD

## 2010-10-29 NOTE — Progress Notes (Signed)
Summary: Refill/gh  Phone Note Refill Request Message from:  Fax from Pharmacy on October 01, 2009 3:07 PM  Refills Requested: Medication #1:  DARVOCET-N 100  TABS Take 1 tablet by mouth every 4 hours as needed for pain Darvocet is no longer on the market.  Pt needs something else.   Method Requested: Electronic Initial call taken by: Sander Nephew RN,  October 01, 2009 3:09 PM  Follow-up for Phone Call        It seems like he was on darvocet for knee pain. But, in order to clarify it and to know what he needs he needs an office visit.  Follow-up by: Flo Shanks MD,  October 02, 2009 11:01 PM     Appended Document: Refill/gh Pharmacy made awared and pt. was instructed to call back tomorrow to make an appt.

## 2010-10-31 NOTE — Progress Notes (Signed)
Summary: Refill/gh  Phone Note Refill Request Message from:  Fax from Pharmacy on October 09, 2010 10:05 AM  Refills Requested: Medication #1:  METFORMIN HCL 1000 MG TABS take 1 pill by mouth two times a day.   Dosage confirmed as above?Dosage Confirmed   Last Refilled: 06/18/2010  Medication #2:  ATENOLOL 100 MG TABS Take 1 tablet by mouth once a day   Dosage confirmed as above?Dosage Confirmed   Last Refilled: 04/02/2010 Last visit and labs were 03/05/2010.   Method Requested: Electronic Initial call taken by: Sander Nephew RN,  October 09, 2010 10:05 AM    Prescriptions: ZOCOR 20 MG TABS (SIMVASTATIN) take 1 pill by mouth daily.  #30 x 3   Entered and Authorized by:   Lysle Pearl MD   Signed by:   Lysle Pearl MD on 10/09/2010   Method used:   Electronically to        [facility] (retail)       [address] [place], [place]  [id number]       Ph: [phone]       Fax: [phone]   RxID:   [id number] METFORMIN HCL 1000 MG TABS (METFORMIN HCL) take 1 pill by mouth two times a day.  #60 x 3   Entered and Authorized by:   Lysle Pearl MD   Signed by:   Lysle Pearl MD on 10/09/2010   Method used:   Electronically to        [facility] (retail)       [address] [place], [place]  [id number]       Ph: [phone]       Fax: [phone]   [id number] FLUOXETINE HCL 40 MG CAPS (FLUOXETINE HCL) Take 1 capsule by mouth once a day  #31 x 3   Entered and Authorized by:   Lysle Pearl MD   Signed by:   Lysle Pearl MD on 10/09/2010   Method used:   Electronically to        [facility] (retail)       [address] [place], [place]  [id number]       Ph: [phone]       Fax: [phone]   [id number] HYDROCHLOROTHIAZIDE 25 MG TABS (HYDROCHLOROTHIAZIDE) Take 1 tablet by mouth once a day  #30 x 3   Entered and Authorized by:   Lysle Pearl MD  Signed by:   Lysle Pearl MD on 10/09/2010   Method used:   Electronically to        [facility] (retail)       [address] [place], [place]  [id number]       Ph: [phone]       Fax: [phone]   RxID:   [id number]

## 2010-11-06 NOTE — Progress Notes (Signed)
Summary: refill/ hla  Phone Note Refill Request Message from:  Pharmacy on October 15, 2010 10:54 AM  Refills Requested: Medication #1:  ATENOLOL 100 MG TABS Take 1 tablet by mouth once a day   Dosage confirmed as above?Dosage Confirmed  Medication #2:  ll by mouth two times a day. Initial call taken by: Freddy Finner RN,  October 15, 2010 10:54 AM  Follow-up for Phone Call        Long overdue for app't.   Follow-up by: Milta Deiters MD,  October 15, 2010 3:33 PM  Additional Follow-up for Phone Call Additional follow up Details #1::        tried to call pt, no answer Additional Follow-up by: Freddy Finner RN,  October 28, 2010 5:35 PM

## 2010-11-22 ENCOUNTER — Other Ambulatory Visit: Payer: Self-pay | Admitting: *Deleted

## 2010-11-28 MED ORDER — LISINOPRIL 20 MG PO TABS: 20.0000 mg | ORAL_TABLET | Freq: Every day | ORAL | Status: DC

## 2010-12-12 LAB — GLUCOSE, CAPILLARY: Glucose-Capillary: 176 mg/dL — ABNORMAL HIGH (ref 70–99)

## 2010-12-15 LAB — BASIC METABOLIC PANEL
CO2: 28 mEq/L (ref 19–32)
Calcium: 8.9 mg/dL (ref 8.4–10.5)
Chloride: 94 mEq/L — ABNORMAL LOW (ref 96–112)
Creatinine, Ser: 1.11 mg/dL (ref 0.4–1.5)
GFR calc Af Amer: >60 mL/min (ref >60)
GFR calc non Af Amer: >60 mL/min (ref >60)
Glucose, Bld: 192 mg/dL — ABNORMAL HIGH (ref 70–99)
Potassium: 4 mEq/L (ref 3.5–5.1)
Sodium: 132 mEq/L — ABNORMAL LOW (ref 135–145)

## 2010-12-15 LAB — GLUCOSE, CAPILLARY
Glucose-Capillary: 142 mg/dL — ABNORMAL HIGH (ref 70–99)
Glucose-Capillary: 161 mg/dL — ABNORMAL HIGH (ref 70–99)
Glucose-Capillary: 167 mg/dL — ABNORMAL HIGH (ref 70–99)
Glucose-Capillary: 168 mg/dL — ABNORMAL HIGH (ref 70–99)
Glucose-Capillary: 170 mg/dL — ABNORMAL HIGH (ref 70–99)
Glucose-Capillary: 178 mg/dL — ABNORMAL HIGH (ref 70–99)
Glucose-Capillary: 186 mg/dL — ABNORMAL HIGH (ref 70–99)
Glucose-Capillary: 194 mg/dL — ABNORMAL HIGH (ref 70–99)
Glucose-Capillary: 198 mg/dL — ABNORMAL HIGH (ref 70–99)
Glucose-Capillary: 213 mg/dL — ABNORMAL HIGH (ref 70–99)

## 2010-12-15 LAB — MAGNESIUM
Magnesium: 1.4 mg/dL — ABNORMAL LOW (ref 1.5–2.5)
Magnesium: 2 mg/dL (ref 1.5–2.5)

## 2010-12-15 LAB — COMPREHENSIVE METABOLIC PANEL
Albumin: 3.8 g/dL (ref 3.5–5.2)
Alkaline Phosphatase: 53 U/L (ref 39–117)
Alkaline Phosphatase: 63 U/L (ref 39–117)
BUN: 15 mg/dL (ref 6–23)
BUN: 9 mg/dL (ref 6–23)
Creatinine, Ser: 0.93 mg/dL (ref 0.4–1.5)
Creatinine, Ser: 0.94 mg/dL (ref 0.4–1.5)
Glucose, Bld: 152 mg/dL — ABNORMAL HIGH (ref 70–99)
Glucose, Bld: 189 mg/dL — ABNORMAL HIGH (ref 70–99)
Potassium: 3.7 mEq/L (ref 3.5–5.1)
Potassium: 4 mEq/L (ref 3.5–5.1)
Total Bilirubin: 1.4 mg/dL — ABNORMAL HIGH (ref 0.3–1.2)
Total Protein: 6.2 g/dL (ref 6.0–8.3)
Total Protein: 6.5 g/dL (ref 6.0–8.3)

## 2010-12-15 LAB — URINALYSIS, MICROSCOPIC ONLY
Nitrite: NEGATIVE
Protein, ur: NEGATIVE mg/dL
Specific Gravity, Urine: 1.019 (ref 1.005–1.030)
Urobilinogen, UA: 2 mg/dL — ABNORMAL HIGH (ref 0.0–1.0)

## 2010-12-15 LAB — DIFFERENTIAL
Basophils Relative: 0 % (ref 0–1)
Lymphocytes Relative: 35 % (ref 12–46)
Lymphs Abs: 1.3 10*3/uL (ref 0.7–4.0)
Monocytes Relative: 8 % (ref 3–12)
Neutro Abs: 2 10*3/uL (ref 1.7–7.7)
Neutrophils Relative %: 54 % (ref 43–77)

## 2010-12-15 LAB — URINE MICROSCOPIC-ADD ON

## 2010-12-15 LAB — CBC
HCT: 34.8 % — ABNORMAL LOW (ref 39.0–52.0)
HCT: 37.2 % — ABNORMAL LOW (ref 39.0–52.0)
Hemoglobin: 12 g/dL — ABNORMAL LOW (ref 13.0–17.0)
Hemoglobin: 13 g/dL (ref 13.0–17.0)
MCHC: 34.5 g/dL (ref 30.0–36.0)
MCV: 100.6 fL — ABNORMAL HIGH (ref 78.0–100.0)
MCV: 98.3 fL (ref 78.0–100.0)
Platelets: 42 10*3/uL — ABNORMAL LOW (ref 150–400)
RBC: 3.91 MIL/uL — ABNORMAL LOW (ref 4.22–5.81)
RDW: 16.7 % — ABNORMAL HIGH (ref 11.5–15.5)
RDW: 17.5 % — ABNORMAL HIGH (ref 11.5–15.5)
WBC: 3.7 10*3/uL — ABNORMAL LOW (ref 4.0–10.5)

## 2010-12-15 LAB — RAPID URINE DRUG SCREEN, HOSP PERFORMED
Amphetamines: NOT DETECTED
Benzodiazepines: NOT DETECTED

## 2010-12-15 LAB — URINALYSIS, ROUTINE W REFLEX MICROSCOPIC
Glucose, UA: 250 mg/dL — AB
Ketones, ur: 15 mg/dL — AB
pH: 7.5 (ref 5.0–8.0)

## 2010-12-15 LAB — HEPATIC FUNCTION PANEL
AST: 58 U/L — ABNORMAL HIGH (ref 0–37)
Albumin: 3.7 g/dL (ref 3.5–5.2)
Total Bilirubin: 1.1 mg/dL (ref 0.3–1.2)

## 2010-12-15 LAB — TSH: TSH: 4.08 u[IU]/mL (ref 0.350–4.500)

## 2010-12-15 LAB — URINE CULTURE

## 2010-12-15 LAB — PROTIME-INR
INR: 1.09 (ref 0.00–1.49)
Prothrombin Time: 14 seconds (ref 11.6–15.2)

## 2010-12-15 LAB — ETHANOL: Alcohol, Ethyl (B): 425 mg/dL (ref 0–10)

## 2011-01-06 LAB — COMPREHENSIVE METABOLIC PANEL
ALT: 25 U/L (ref 0–53)
BUN: 6 mg/dL (ref 6–23)
CO2: 25 mEq/L (ref 19–32)
Calcium: 9.8 mg/dL (ref 8.4–10.5)
GFR calc non Af Amer: >60 mL/min (ref >60)
Glucose, Bld: 119 mg/dL — ABNORMAL HIGH (ref 70–99)
Sodium: 140 mEq/L (ref 135–145)

## 2011-01-06 LAB — DIFFERENTIAL
Basophils Absolute: 0 10*3/uL (ref 0.0–0.1)
Eosinophils Relative: 3 % (ref 0–5)
Lymphocytes Relative: 42 % (ref 12–46)
Monocytes Absolute: 0.3 10*3/uL (ref 0.1–1.0)
Monocytes Relative: 7 % (ref 3–12)
Neutro Abs: 1.9 10*3/uL (ref 1.7–7.7)
Neutrophils Relative %: 47 % (ref 43–77)

## 2011-01-06 LAB — GLUCOSE, CAPILLARY
Glucose-Capillary: 157 mg/dL — ABNORMAL HIGH (ref 70–99)
Glucose-Capillary: 187 mg/dL — ABNORMAL HIGH (ref 70–99)
Glucose-Capillary: 187 mg/dL — ABNORMAL HIGH (ref 70–99)
Glucose-Capillary: 191 mg/dL — ABNORMAL HIGH (ref 70–99)

## 2011-01-06 LAB — URINALYSIS, ROUTINE W REFLEX MICROSCOPIC
Glucose, UA: NEGATIVE mg/dL
pH: 6 (ref 5.0–8.0)

## 2011-01-06 LAB — RAPID URINE DRUG SCREEN, HOSP PERFORMED
Barbiturates: NOT DETECTED
Benzodiazepines: NOT DETECTED
Cocaine: NOT DETECTED

## 2011-01-06 LAB — ETHANOL: Alcohol, Ethyl (B): 53 mg/dL — ABNORMAL HIGH (ref 0–10)

## 2011-01-06 LAB — CBC
HCT: 43.2 % (ref 39.0–52.0)
Hemoglobin: 14.8 g/dL (ref 13.0–17.0)
MCHC: 34.3 g/dL (ref 30.0–36.0)
MCV: 99.8 fL (ref 78.0–100.0)
RBC: 4.33 MIL/uL (ref 4.22–5.81)

## 2011-03-16 ENCOUNTER — Emergency Department (HOSPITAL_COMMUNITY): Payer: Medicare Other

## 2011-03-16 ENCOUNTER — Inpatient Hospital Stay (HOSPITAL_COMMUNITY)
Admission: EM | Admit: 2011-03-16 | Discharge: 2011-03-18 | DRG: 897 | Disposition: A | Discharge status: Home / Self Care | Payer: Medicare Other | Attending: Internal Medicine | Admitting: Internal Medicine

## 2011-03-16 DIAGNOSIS — I10 Essential (primary) hypertension: Secondary | ICD-10-CM | POA: Diagnosis present

## 2011-03-16 DIAGNOSIS — E876 Hypokalemia: Secondary | ICD-10-CM | POA: Diagnosis present

## 2011-03-16 DIAGNOSIS — F1010 Alcohol abuse, uncomplicated: Principal | ICD-10-CM | POA: Diagnosis present

## 2011-03-16 DIAGNOSIS — F329 Major depressive disorder, single episode, unspecified: Secondary | ICD-10-CM | POA: Diagnosis present

## 2011-03-16 DIAGNOSIS — E119 Type 2 diabetes mellitus without complications: Secondary | ICD-10-CM | POA: Diagnosis present

## 2011-03-16 DIAGNOSIS — D6489 Other specified anemias: Secondary | ICD-10-CM | POA: Diagnosis present

## 2011-03-16 DIAGNOSIS — R404 Transient alteration of awareness: Secondary | ICD-10-CM | POA: Diagnosis present

## 2011-03-16 DIAGNOSIS — F3289 Other specified depressive episodes: Secondary | ICD-10-CM | POA: Diagnosis present

## 2011-03-16 DIAGNOSIS — R55 Syncope and collapse: Secondary | ICD-10-CM | POA: Diagnosis present

## 2011-03-16 LAB — URINALYSIS, ROUTINE W REFLEX MICROSCOPIC
Glucose, UA: NEGATIVE mg/dL
Hgb urine dipstick: NEGATIVE
Ketones, ur: NEGATIVE mg/dL
Leukocytes, UA: NEGATIVE
Protein, ur: NEGATIVE mg/dL
pH: 6 (ref 5.0–8.0)

## 2011-03-16 LAB — GLUCOSE, CAPILLARY: Glucose-Capillary: 251 mg/dL — ABNORMAL HIGH (ref 70–99)

## 2011-03-16 LAB — CBC
MCV: 96 fL (ref 78.0–100.0)
Platelets: 189 10*3/uL (ref 150–400)
RBC: 3.72 MIL/uL — ABNORMAL LOW (ref 4.22–5.81)
RDW: 13 % (ref 11.5–15.5)
WBC: 5 10*3/uL (ref 4.0–10.5)

## 2011-03-16 LAB — DIFFERENTIAL
Basophils Absolute: 0.1 10*3/uL (ref 0.0–0.1)
Basophils Relative: 1 % (ref 0–1)
Eosinophils Absolute: 0.2 10*3/uL (ref 0.0–0.7)
Eosinophils Relative: 4 % (ref 0–5)
Lymphs Abs: 1.5 10*3/uL (ref 0.7–4.0)
Neutrophils Relative %: 58 % (ref 43–77)

## 2011-03-16 LAB — COMPREHENSIVE METABOLIC PANEL
ALT: 20 U/L (ref 0–53)
AST: 23 U/L (ref 0–37)
Albumin: 3.9 g/dL (ref 3.5–5.2)
CO2: 26 mEq/L (ref 19–32)
Chloride: 99 mEq/L (ref 96–112)
Creatinine, Ser: 1.03 mg/dL (ref 0.50–1.35)
GFR calc non Af Amer: >60 mL/min (ref >60)
Potassium: 3.3 mEq/L — ABNORMAL LOW (ref 3.5–5.1)
Sodium: 140 mEq/L (ref 135–145)
Total Bilirubin: 0.3 mg/dL (ref 0.3–1.2)

## 2011-03-16 LAB — RAPID URINE DRUG SCREEN, HOSP PERFORMED
Amphetamines: NOT DETECTED
Benzodiazepines: NOT DETECTED
Opiates: NOT DETECTED
Tetrahydrocannabinol: NOT DETECTED

## 2011-03-17 DIAGNOSIS — F4320 Adjustment disorder, unspecified: Secondary | ICD-10-CM

## 2011-03-17 LAB — CBC
HCT: 32.5 % — ABNORMAL LOW (ref 39.0–52.0)
Hemoglobin: 11.1 g/dL — ABNORMAL LOW (ref 13.0–17.0)
MCH: 32.9 pg (ref 26.0–34.0)
MCHC: 34.2 g/dL (ref 30.0–36.0)
RDW: 12.9 % (ref 11.5–15.5)

## 2011-03-17 LAB — BASIC METABOLIC PANEL
BUN: 23 mg/dL (ref 6–23)
Calcium: 9.8 mg/dL (ref 8.4–10.5)
GFR calc Af Amer: >60 mL/min (ref >60)
GFR calc non Af Amer: >60 mL/min (ref >60)
Glucose, Bld: 223 mg/dL — ABNORMAL HIGH (ref 70–99)

## 2011-03-17 LAB — FOLATE: Folate: 4.8 ng/mL

## 2011-03-17 LAB — GLUCOSE, CAPILLARY: Glucose-Capillary: 162 mg/dL — ABNORMAL HIGH (ref 70–99)

## 2011-03-17 LAB — FERRITIN: Ferritin: 90 ng/mL (ref 22–322)

## 2011-03-17 LAB — IRON AND TIBC
Iron: 128 ug/dL (ref 42–135)
TIBC: 286 ug/dL (ref 215–435)

## 2011-03-18 LAB — GLUCOSE, CAPILLARY
Glucose-Capillary: 168 mg/dL — ABNORMAL HIGH (ref 70–99)
Glucose-Capillary: 143 mg/dL — ABNORMAL HIGH (ref 70–99)

## 2011-03-18 LAB — BASIC METABOLIC PANEL
BUN: 18 mg/dL (ref 6–23)
Chloride: 98 mEq/L (ref 96–112)
Creatinine, Ser: 1.1 mg/dL (ref 0.50–1.35)
GFR calc Af Amer: >60 mL/min (ref >60)
Glucose, Bld: 131 mg/dL — ABNORMAL HIGH (ref 70–99)

## 2011-03-18 LAB — HEMOGLOBIN A1C: Mean Plasma Glucose: 120 mg/dL — ABNORMAL HIGH (ref <117)

## 2011-03-18 NOTE — Discharge Summary (Signed)
NAME:  [name], [name].:  [id number]  MEDICAL RECORD NO.:  [id number]  LOCATION:  [id number]                         FACILITY:  [facility]  PHYSICIAN:  Lottie Dawson, MD       DATE OF BIRTH:  [birth year]  DATE OF ADMISSION:  03/16/2011 DATE OF DISCHARGE:                              DISCHARGE SUMMARY   PRIMARY CARE PHYSICIAN:  Vertell Novak, [facility] Teaching Service.  DISCHARGE DIAGNOSES: 1. Alcohol intoxication. 2. Alcohol abuse. 3. Hypertension. 4. Hypomagnesemia. 5. Type 2 diabetes. 6. Depression. 7. Anemia likely due to chronic alcohol use. 8. Hypokalemia, resolved. 9. Loss of consciousness/syncope likely due to alcohol intoxication.     No further episodes during the course of hospital stay.  DISCHARGE MEDICATIONS: 1. Folic acid 1 mg p.o. daily 2. Magnesium oxide 400 mg p.o. daily 3. Thiamine 100 mg p.o. daily 4. Fluoxetine 40 mg daily 5. Hydrochlorothiazide 25 mg p.o. daily. 6. Lisinopril 40 mg p.o. daily 7. Metformin 1000 mg p.o. twice daily. 8. Simvastatin 20 mg p.o. daily at bedtime.  BRIEF ADMITTING HISTORY AND PHYSICAL:  [name] is a 65 year old Caucasian male with history of alcohol abuse, type 2 diabetes, hypercholesterolemia, hypertension, history of anemia, and depression who presents with alcohol intoxication and loss of consciousness.  The patient was admitted on March 16, 2011, after he was witnessed by his neighbors losing consciousness.  As a result, EMS was called.  The EMS found that the house was very unhygienic, noted insects and stale food. As a result because of unsafe home situation, the ER asked the hospitalist service to admit the patient.  The patient had a CT of the head and C-spine which shows no evidence for acute intracranial abnormality.  No fracture or dislocation is seen. Multilevel degenerative changes at the cervical spine noted.  LABORATORY DATA:  CBC shows a white count of 4.0, hemoglobin 11.1, hematocrit  32.5, platelet count 162.  Electrolytes normal with a BUN of 18, creatinine 1.10.  Liver function tests normal.  Magnesium is 1.4. Serum iron 128.  Urine drug screen was negative.  Blood alcohol level was 335.  UA was negative for nitrites and leukocytes.  HOSPITAL COURSE BY PROBLEM: 1. Alcohol intoxication/abuse.  The patient was placed on CIWA     protocol, was started on thiamine and folate.  Psychiatry was     consulted.  The patient declined inpatient rehab, as a result     Psychiatry signed off.  When I asked the patient what would happen     if he continued to drink he indicated that he could die and was     aware of the risks.  The patient did want outpatient resources     for alcohol cessation, as a result we will social worker provide     resources for breath. 2. Unsafe home situation.  Because of what was found by the EMS when     they arrived at home, it was felt that the patient was unsafe for     discharge initially.  After having discussion with Education officer, museum,     it was reported about adult protective services were involved;     however, they will  not see the patient unless after the patient is     discharged.  As a result, at discharge adult protective services     will be notified. 3. Hypertension.  Blood pressure has been elevated.  The patient has     not been taking his home medications which will be resumed at the     time of discharge. 4. Type 2 diabetes.  Will resume the patient on metformin. 5. History of depression.  Continue fluoxetine. 6. Anemia likely due to alcohol use.  Anemia panel showed normal iron     studies. 7. Hypomagnesemia, replaced as needed, likely due to poor nutrition     from alcohol consumption.  The patient was placed on magnesium     oxide. 8. Hypokalemia, resolved with replacement. 9. Loss of consciousness/syncope which prompted the hospitalization.     Had no further episodes.  Head CT was negative.  Loss of     consciousness was  likely prompted by alcohol intoxication.  No     further workup was done.  The patient did not have any further     episodes during the course of hospital stay.  DISPOSITION AND FOLLOWUP:  The patient to follow with his primary care physician in 1 week.  He will have case manager talk to him and try to arrange for followup before discharge.  Time spent on discharge, talking to the patient and coordinating care was 35 minutes.   Lottie Dawson, MD   SR/MEDQ  D:  03/18/2011  T:  03/18/2011  Job:  [id number]  Electronically Signed by Alveta Heimlich Linzie Boursiquot  on 03/18/2011 10:11:55 PM
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
## 2011-03-24 ENCOUNTER — Other Ambulatory Visit: Payer: Self-pay | Admitting: *Deleted

## 2011-03-24 MED ORDER — LISINOPRIL 20 MG PO TABS: 20.0000 mg | ORAL_TABLET | Freq: Every day | ORAL | Status: DC

## 2011-03-24 NOTE — Telephone Encounter (Signed)
Patient not seen in one year. May not have another refill before being seen.

## 2011-03-24 NOTE — Telephone Encounter (Signed)
Pt has an appt July 20.

## 2011-04-18 ENCOUNTER — Encounter: Payer: Medicare Other | Admitting: Internal Medicine

## 2011-05-05 ENCOUNTER — Encounter: Payer: Self-pay | Admitting: Internal Medicine

## 2011-09-01 ENCOUNTER — Emergency Department (HOSPITAL_COMMUNITY)
Admission: EM | Admit: 2011-09-01 | Discharge: 2011-09-01 | Disposition: A | Discharge status: Home / Self Care | Payer: Medicare Other | Attending: Emergency Medicine | Admitting: Emergency Medicine

## 2011-09-01 ENCOUNTER — Encounter (HOSPITAL_COMMUNITY): Payer: Self-pay | Admitting: Emergency Medicine

## 2011-09-01 DIAGNOSIS — E119 Type 2 diabetes mellitus without complications: Secondary | ICD-10-CM | POA: Insufficient documentation

## 2011-09-01 DIAGNOSIS — I10 Essential (primary) hypertension: Secondary | ICD-10-CM | POA: Insufficient documentation

## 2011-09-01 DIAGNOSIS — R739 Hyperglycemia, unspecified: Secondary | ICD-10-CM

## 2011-09-01 DIAGNOSIS — R6883 Chills (without fever): Secondary | ICD-10-CM | POA: Insufficient documentation

## 2011-09-01 LAB — CBC
HCT: 37.9 % — ABNORMAL LOW (ref 39.0–52.0)
MCH: 31.5 pg (ref 26.0–34.0)
MCHC: 34.6 g/dL (ref 30.0–36.0)
MCV: 91.1 fL (ref 78.0–100.0)
Platelets: 207 10*3/uL (ref 150–400)
RDW: 12.9 % (ref 11.5–15.5)
WBC: 6.4 10*3/uL (ref 4.0–10.5)

## 2011-09-01 LAB — DIFFERENTIAL
Basophils Absolute: 0.1 10*3/uL (ref 0.0–0.1)
Eosinophils Absolute: 0.1 10*3/uL (ref 0.0–0.7)
Eosinophils Relative: 1 % (ref 0–5)
Monocytes Absolute: 0.5 10*3/uL (ref 0.1–1.0)

## 2011-09-01 LAB — BASIC METABOLIC PANEL
Calcium: 9.8 mg/dL (ref 8.4–10.5)
Creatinine, Ser: 1.01 mg/dL (ref 0.50–1.35)
GFR calc non Af Amer: 75 mL/min — ABNORMAL LOW (ref >90)
Sodium: 133 mEq/L — ABNORMAL LOW (ref 135–145)

## 2011-09-01 MED ORDER — POTASSIUM CHLORIDE CRYS ER 20 MEQ PO TBCR
40.0000 meq | EXTENDED_RELEASE_TABLET | Freq: Once | ORAL | Status: AC
  Administered 2011-09-01: 40 meq via ORAL
  Filled 2011-09-01: qty 2

## 2011-09-01 NOTE — ED Notes (Signed)
[id number] Expected date:09/01/11<BR> Expected time: 4:56 PM<BR> Means of arrival:Ambulance<BR> Comments:<BR> EMS 61 GC, 65 yom cold chills/ diabetic with CBG 200

## 2011-09-01 NOTE — ED Provider Notes (Signed)
History     CSN: [id number] Arrival date & time: 09/01/2011  5:23 PM   First MD Initiated Contact with Patient 09/01/11 1850      Chief Complaint  Patient presents with  . Chills    Pt to ED with c/o chills [place] [employer]. Pt denies any symptoms prior to eating sub. Pt states has been non-compliant with taking his medications as well    (Consider location/radiation/quality/duration/timing/severity/associated sxs/prior treatment) The history is provided by the patient and the spouse.   is here complaining of chills while indoors at a restaurant. Denies any vomiting, diarrhea, cough, rash. When patient went outside and became warm he felt better. Denies any headache neck pain photophobia chest pain abdominal pain. Denies any urinary symptoms at this time. States that he feels back to his baseline  Patient also has a history of diabetes high blood pressure has been out of his diabetes medication for the past month. Continue to take his high blood pressure medication  Past Medical History  Diagnosis Date  . Diabetes mellitus type II   . Hyperlipemia   . Anxiety   . Alcohol abuse   . Hearing impairment   . HTN (hypertension)   . Cataract     No past surgical history on file.  No family history on file.  History  Substance Use Topics  . Smoking status: Not on file  . Smokeless tobacco: Not on file  . Alcohol Use: Not on file      Review of Systems  All other systems reviewed and are negative.    Allergies  Review of patient's allergies indicates no known allergies.  Home Medications   Current Outpatient Rx  Name Route Sig Dispense Refill  . HYDROCHLOROTHIAZIDE 25 MG PO TABS Oral Take 25 mg by mouth daily.      Marland Kitchen LISINOPRIL 20 MG PO TABS Oral Take 1 tablet (20 mg total) by mouth daily. 90 tablet 0  . MAGNESIUM OXIDE 400 MG PO TABS Oral Take 400 mg by mouth daily.        BP 189/108  Pulse 64  Temp(Src) 98.2 F (36.8 C) (Oral)  Resp 20  SpO2  100%  Physical Exam  Nursing note and vitals reviewed. Constitutional: He is oriented to person, place, and time. He appears well-developed and well-nourished.  Non-toxic appearance. No distress.  HENT:  Head: Normocephalic and atraumatic.  Eyes: Conjunctivae, EOM and lids are normal. Pupils are equal, round, and reactive to light.  Neck: Normal range of motion. Neck supple. No tracheal deviation present. No mass present.  Cardiovascular: Normal rate, regular rhythm and normal heart sounds.  Exam reveals no gallop.   No murmur heard. Pulmonary/Chest: Effort normal and breath sounds normal. No stridor. No respiratory distress. He has no decreased breath sounds. He has no wheezes. He has no rhonchi. He has no rales.  Abdominal: Soft. Normal appearance and bowel sounds are normal. He exhibits no distension. There is no tenderness. There is no rebound and no CVA tenderness.  Musculoskeletal: Normal range of motion. He exhibits no edema and no tenderness.  Neurological: He is alert and oriented to person, place, and time. He has normal strength. No cranial nerve deficit or sensory deficit. GCS eye subscore is 4. GCS verbal subscore is 5. GCS motor subscore is 6.  Skin: Skin is warm and dry. No abrasion and no rash noted.  Psychiatric: He has a normal mood and affect. His speech is normal and behavior is  normal.    ED Course  Procedures (including critical care time)  Labs Reviewed  GLUCOSE, CAPILLARY - Abnormal; Notable for the following:    Glucose-Capillary 274 (*)    All other components within normal limits  CBC  DIFFERENTIAL  I-STAT, CHEM 8   No results found.   No diagnosis found.    MDM  Pt encouraged to f/u with his pcp for a refill of his diabetes meds--potassium replaced        Leota Jacobsen, MD 09/01/11 2108

## 2011-09-01 NOTE — ED Notes (Signed)
Chief Complaint  Patient presents with  . Chills    Pt to ED with c/o chills while [place] [employer]. Pt denies any symptoms prior to eating sub. Pt states has been non-compliant with taking his medications as well  Pt denies any symptoms at this time. Pt in gown. Pt awaits MD eval

## 2011-09-05 ENCOUNTER — Encounter: Payer: Medicare Other | Admitting: Internal Medicine

## 2011-09-30 DIAGNOSIS — F10239 Alcohol dependence with withdrawal, unspecified: Secondary | ICD-10-CM

## 2011-09-30 DIAGNOSIS — F10939 Alcohol use, unspecified with withdrawal, unspecified: Secondary | ICD-10-CM

## 2011-09-30 DIAGNOSIS — R569 Unspecified convulsions: Secondary | ICD-10-CM

## 2011-09-30 HISTORY — DX: Alcohol use, unspecified with withdrawal, unspecified: F10.939

## 2011-09-30 HISTORY — DX: Alcohol dependence with withdrawal, unspecified: F10.239

## 2011-09-30 HISTORY — DX: Unspecified convulsions: R56.9

## 2011-10-25 ENCOUNTER — Other Ambulatory Visit: Payer: Self-pay | Admitting: Internal Medicine

## 2011-10-27 NOTE — Telephone Encounter (Signed)
This patient has [facility] his past 2 appointments.  I cannot find any [facility] app'ts that he kept.  Last K = 3.1 (in ER).  Please call and require an app't -- If he accepts, let me know and I will refill until app't.

## 2011-10-27 NOTE — Telephone Encounter (Signed)
Pt has npt been seen in forever and cancelled his last two appt. Please tell him to call the front desk to make an appt. Once he has done that I will refill his meds to last until his appt.

## 2011-10-27 NOTE — Telephone Encounter (Signed)
Pls see prior message.

## 2011-10-28 NOTE — Telephone Encounter (Signed)
Pt has an appointment for 11/10/2010 4:56 PM.  Sander Nephew, RN 10/28/2011 4:56 PM

## 2011-11-11 ENCOUNTER — Ambulatory Visit: Payer: Medicare Other | Admitting: Internal Medicine

## 2011-11-11 ENCOUNTER — Ambulatory Visit (INDEPENDENT_AMBULATORY_CARE_PROVIDER_SITE_OTHER): Payer: Medicare Other | Admitting: Internal Medicine

## 2011-11-11 ENCOUNTER — Encounter: Payer: Self-pay | Admitting: Internal Medicine

## 2011-11-11 VITALS — BP 163/102 | HR 90 | Temp 97.9°F | Ht 68.0 in | Wt 189.7 lb

## 2011-11-11 DIAGNOSIS — Z79899 Other long term (current) drug therapy: Secondary | ICD-10-CM

## 2011-11-11 DIAGNOSIS — H9190 Unspecified hearing loss, unspecified ear: Secondary | ICD-10-CM

## 2011-11-11 DIAGNOSIS — H9192 Unspecified hearing loss, left ear: Secondary | ICD-10-CM

## 2011-11-11 DIAGNOSIS — F419 Anxiety disorder, unspecified: Secondary | ICD-10-CM

## 2011-11-11 DIAGNOSIS — E119 Type 2 diabetes mellitus without complications: Secondary | ICD-10-CM | POA: Diagnosis not present

## 2011-11-11 DIAGNOSIS — F411 Generalized anxiety disorder: Secondary | ICD-10-CM | POA: Diagnosis not present

## 2011-11-11 DIAGNOSIS — I10 Essential (primary) hypertension: Secondary | ICD-10-CM

## 2011-11-11 DIAGNOSIS — E785 Hyperlipidemia, unspecified: Secondary | ICD-10-CM | POA: Diagnosis not present

## 2011-11-11 DIAGNOSIS — M1990 Unspecified osteoarthritis, unspecified site: Secondary | ICD-10-CM

## 2011-11-11 DIAGNOSIS — H9209 Otalgia, unspecified ear: Secondary | ICD-10-CM

## 2011-11-11 LAB — GLUCOSE, CAPILLARY: Glucose-Capillary: 92 mg/dL (ref 70–99)

## 2011-11-11 LAB — LIPID PANEL
Cholesterol: 175 mg/dL (ref 0–200)
Total CHOL/HDL Ratio: 3.9 Ratio
Triglycerides: 203 mg/dL — ABNORMAL HIGH (ref <150)
VLDL: 41 mg/dL — ABNORMAL HIGH (ref 0–40)

## 2011-11-11 LAB — POCT GLYCOSYLATED HEMOGLOBIN (HGB A1C): Hemoglobin A1C: 5.6

## 2011-11-11 MED ORDER — LISINOPRIL 40 MG PO TABS: 40.0000 mg | ORAL_TABLET | Freq: Every day | ORAL | Status: DC

## 2011-11-11 MED ORDER — FLUOXETINE HCL 40 MG PO CAPS: 40.0000 mg | ORAL_CAPSULE | Freq: Every day | ORAL | Status: DC

## 2011-11-11 MED ORDER — ACETAMINOPHEN 325 MG PO TABS: 650.0000 mg | ORAL_TABLET | ORAL | Status: DC | PRN

## 2011-11-11 MED ORDER — HYDROCHLOROTHIAZIDE 25 MG PO TABS: 25.0000 mg | ORAL_TABLET | Freq: Every day | ORAL | Status: DC

## 2011-11-11 MED ORDER — SIMVASTATIN 20 MG PO TABS: 20.0000 mg | ORAL_TABLET | Freq: Every evening | ORAL | Status: DC

## 2011-11-11 NOTE — Progress Notes (Signed)
Subjective:   Patient ID: William [name] male   DOB: 13-Jul-1946 66 y.o.   MRN: [id number]  HPI:  [name] a 66 y.o.  Caucasian male with a history of alcohol abuse, type 2 diabetes, hypercholesterolemia, hypertension, anxiety and depression, who presents for a follow up visit and wants to get his medications [name] refilled.   Patient reports that he run off all his medications 4 months ago. He is not taking any medications currently. He has chronic right shoulder and knee pain due to osteoarthritis, which is getting worse after running off his tylenol. He also complaints of having hearing loss and ear pain bilaterally. There is no ear discharge. No fever or chills. No sinus symptoms. Occasionally, he feels anxious for no reason. Regarding his alcohol abuse, he said that he drinks a pack of beers every 3 weeks. He does not wants to quit his drinking now.   Denies fever, chills, fatigue, headaches, cough, chest pain, SOB,  abdominal pain,diarrhea, constipation, dysuria, urgency, frequency, hematuria.  Past Medical History  Diagnosis Date  . Diabetes mellitus type II   . Hyperlipemia   . Anxiety   . Alcohol abuse   . Hearing impairment   . HTN (hypertension)   . Cataract    Current Outpatient Prescriptions  Medication Sig Dispense Refill  . acetaminophen (TYLENOL) 325 MG tablet Take 2 tablets (650 mg total) by mouth every 4 (four) hours as needed for pain.  100 tablet  3  . FLUoxetine (PROZAC) 40 MG capsule Take 1 capsule (40 mg total) by mouth daily.  30 capsule  3  . hydrochlorothiazide (HYDRODIURIL) 25 MG tablet Take 1 tablet (25 mg total) by mouth daily.  30 tablet  6  . lisinopril (PRINIVIL,ZESTRIL) 40 MG tablet Take 1 tablet (40 mg total) by mouth daily.  90 tablet  3  . magnesium oxide (MAG-OX) 400 MG tablet Take 400 mg by mouth daily.        . simvastatin (ZOCOR) 20 MG tablet Take 1 tablet (20 mg total) by mouth every evening.  30 tablet  11  . DISCONTD: lisinopril  (PRINIVIL,ZESTRIL) 20 MG tablet Take 1 tablet (20 mg total) by mouth daily.  90 tablet  0   No family history on file. History   Social History  . Marital Status: Single    Spouse Name: N/A    Number of Children: N/A  . Years of Education: N/A   Social History Main Topics  . Smoking status: Current Everyday Smoker    Types: Cigarettes  . Smokeless tobacco: Current User    Types: Chew  . Alcohol Use: Not on file  . Drug Use: Not on file  . Sexually Active: Not on file   Other Topics Concern  . Not on file   Social History Narrative  . No narrative on file   Review of Systems: General: no fevers, chills, no changes in body weight, no changes in appetite Skin: no rash HEENT: no blurry vision. Has hearing loss and ear pain bilaterally.  Pulm: no dyspnea, coughing, wheezing CV: no chest pain, palpitations, shortness of breath Abd: no nausea/vomiting, abdominal pain, diarrhea/constipation GU: no dysuria, hematuria, polyuria Ext: has pain in right shoulder and knee Neuro: no weakness, numbness, or tingling  Objective:  Physical Exam: Filed Vitals:   11/11/11 1608  BP: 163/102  Pulse: 90  Temp: 97.9 F (36.6 C)  TempSrc: Oral  Height: 5\' 8"  (1.727 m)  Weight: 189 lb 11.2 oz (86.047  kg)  SpO2: 93%   General: not in acute distress HEENT: PERRL, EOMI, no scleral icterus. Has hearing loss and ear pain bilaterally. There is no discharge from his ear. No signs of infection.  Cardiac: S1/S2, RRR, No murmurs, gallops or rubs Pulm: Good air movement bilaterally, Clear to auscultation bilaterally, No rales, wheezing, rhonchi or rubs. Abd: Soft,  nondistended, nontender, no rebound pain, no organomegaly, BS present Ext: No rashes or edema, 2+DP/PT pulse bilaterally. Tender over his right shoulder and knee. There is no redness, swelling or warmth over his shoulder or knee joints. Neuro: alert and oriented X3, cranial nerves II-XII grossly intact, muscle strength 5/5 in all  extremeties,  sensation to light touch intact.   Assessment & Plan:   # HTN: his bp is elevated (163/102). Most likely due to running off his medications. Will restart his HCTZ and Lisinopril. Will check his CMP in 3 weeks.   #DM-II: His A1c is 5.6 although he has not taken his Metformin for 4 month. It is likely that his DM-II is diet controlled. Will not restart his Metformin today. Will follow up his CBG in his next visit.  # HLD: his LDL was 93 on 12/03/09. His DM-II is well controlled. So his LDL goal should [name] less than 100. His AST and ALT was normal on 03/16/11. Will check his lipid panel and start his Simvastatin today.   # Osteoarthritis: his shoulder and knee pain is most likely due to Osteoarthritis. His knee and shoulder joints are not swelling or red or warm, indicating it is unlikely to have infection. Will start tylenol and give him a prescription of '[place]".   # Anxiety: he still has occasional symptoms. Will start his fluoxetine today.  # Hearing loss and ear pain: his hearing loss is likely due to aging. But etiology for his ear pain is not clear. There is no discharge from his ear. No signs of infection by otoscope. Will give him a referral to ENT.   Ivor Costa

## 2011-11-11 NOTE — Patient Instructions (Signed)
1. Please follow-up in the clinic in 3 weeks. 2. Please take all medications as prescribed.  3. If you have worsening of your symptoms or new symptoms arise, please call the clinic [phone]), or go to the ER immediately if symptoms are severe. 4.

## 2011-11-12 LAB — COMPLETE METABOLIC PANEL WITH GFR
AST: 18 U/L (ref 0–37)
Albumin: 4.2 g/dL (ref 3.5–5.2)
Alkaline Phosphatase: 59 U/L (ref 39–117)
BUN: 11 mg/dL (ref 6–23)
Calcium: 9.2 mg/dL (ref 8.4–10.5)
Chloride: 106 mEq/L (ref 96–112)
Creat: 1.02 mg/dL (ref 0.50–1.35)
GFR, Est Non African American: 75 mL/min
Glucose, Bld: 94 mg/dL (ref 70–99)
Potassium: 4.1 mEq/L (ref 3.5–5.3)
Sodium: 136 mEq/L (ref 135–145)

## 2011-11-12 NOTE — Progress Notes (Signed)
I discussed William [name]'s care with Dr Blaine Hamper and I agree with his note.

## 2011-11-29 ENCOUNTER — Emergency Department (HOSPITAL_COMMUNITY): Payer: Medicare Other

## 2011-11-29 ENCOUNTER — Emergency Department (HOSPITAL_COMMUNITY)
Admission: EM | Admit: 2011-11-29 | Discharge: 2011-11-29 | Disposition: A | Discharge status: Home / Self Care | Payer: Medicare Other | Attending: Emergency Medicine | Admitting: Emergency Medicine

## 2011-11-29 DIAGNOSIS — M542 Cervicalgia: Secondary | ICD-10-CM | POA: Insufficient documentation

## 2011-11-29 DIAGNOSIS — S41009A Unspecified open wound of unspecified shoulder, initial encounter: Secondary | ICD-10-CM | POA: Diagnosis not present

## 2011-11-29 DIAGNOSIS — R21 Rash and other nonspecific skin eruption: Secondary | ICD-10-CM | POA: Insufficient documentation

## 2011-11-29 DIAGNOSIS — S01319A Laceration without foreign body of unspecified ear, initial encounter: Secondary | ICD-10-CM

## 2011-11-29 DIAGNOSIS — F10929 Alcohol use, unspecified with intoxication, unspecified: Secondary | ICD-10-CM

## 2011-11-29 DIAGNOSIS — I10 Essential (primary) hypertension: Secondary | ICD-10-CM | POA: Insufficient documentation

## 2011-11-29 DIAGNOSIS — M47812 Spondylosis without myelopathy or radiculopathy, cervical region: Secondary | ICD-10-CM | POA: Insufficient documentation

## 2011-11-29 DIAGNOSIS — E119 Type 2 diabetes mellitus without complications: Secondary | ICD-10-CM | POA: Insufficient documentation

## 2011-11-29 DIAGNOSIS — S0180XA Unspecified open wound of other part of head, initial encounter: Secondary | ICD-10-CM | POA: Diagnosis not present

## 2011-11-29 DIAGNOSIS — W57XXXA Bitten or stung by nonvenomous insect and other nonvenomous arthropods, initial encounter: Secondary | ICD-10-CM | POA: Insufficient documentation

## 2011-11-29 DIAGNOSIS — F1010 Alcohol abuse, uncomplicated: Secondary | ICD-10-CM | POA: Insufficient documentation

## 2011-11-29 DIAGNOSIS — H9209 Otalgia, unspecified ear: Secondary | ICD-10-CM | POA: Insufficient documentation

## 2011-11-29 DIAGNOSIS — W19XXXA Unspecified fall, initial encounter: Secondary | ICD-10-CM | POA: Insufficient documentation

## 2011-11-29 DIAGNOSIS — Z9181 History of falling: Secondary | ICD-10-CM | POA: Diagnosis not present

## 2011-11-29 DIAGNOSIS — S01309A Unspecified open wound of unspecified ear, initial encounter: Secondary | ICD-10-CM | POA: Insufficient documentation

## 2011-11-29 DIAGNOSIS — T148 Other injury of unspecified body region: Secondary | ICD-10-CM | POA: Insufficient documentation

## 2011-11-29 DIAGNOSIS — G319 Degenerative disease of nervous system, unspecified: Secondary | ICD-10-CM | POA: Insufficient documentation

## 2011-11-29 DIAGNOSIS — S6990XA Unspecified injury of unspecified wrist, hand and finger(s), initial encounter: Secondary | ICD-10-CM | POA: Diagnosis not present

## 2011-11-29 MED ORDER — TETANUS-DIPHTH-ACELL PERTUSSIS 5-2.5-18.5 LF-MCG/0.5 IM SUSP
0.5000 mL | Freq: Once | INTRAMUSCULAR | Status: AC
  Administered 2011-11-29: 0.5 mL via INTRAMUSCULAR
  Filled 2011-11-29: qty 0.5

## 2011-11-29 MED ORDER — LORAZEPAM 2 MG/ML IJ SOLN
1.0000 mg | Freq: Once | INTRAMUSCULAR | Status: AC
  Administered 2011-11-29: 1 mg via INTRAMUSCULAR
  Filled 2011-11-29: qty 1

## 2011-11-29 NOTE — ED Notes (Signed)
CSW telephoned APS weekend hotline at [phone] to initiate referral. CSW notified that on call social worker would return call in 30 minutes. CSW did not receive call from on call social worker but will provide pt's neighbor with APS number to make a secondary contact to provide collateral information as well.
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
## 2011-11-29 NOTE — ED Notes (Signed)
RN has brought visitor 8 ginger ales to drink since arrival. Brought patient two sandwiches, of which one is presently being eaten by visitor

## 2011-11-29 NOTE — ED Provider Notes (Signed)
History     CSN: [id number]  Arrival date & time 11/29/11  1227   First MD Initiated Contact with Patient 11/29/11 1246     1:52 PM HPI Patient is accompanied by neighbor who states she heard him talking against the wall. Reports she was to his home and found he was covered in feces and urine. Reports he fell and has a large laceration to his ear. Neighbor requested a social work consult for. Patient refusing alcohol detox. Reports no pain in ear unless palpating it. Patient is a 66 y.o. male presenting with skin laceration. The history is provided by the patient and a friend.  Laceration  The incident occurred 1 to 2 hours ago. Pain location: left ear. The laceration is 2 cm in size. The laceration mechanism is unknown.The pain is mild.    Past Medical History  Diagnosis Date  . Diabetes mellitus type II   . Hyperlipemia   . Anxiety   . Alcohol abuse   . Hearing impairment   . HTN (hypertension)   . Cataract     No past surgical history on file.  No family history on file.  History  Substance Use Topics  . Smoking status: Current Everyday Smoker    Types: Cigarettes  . Smokeless tobacco: Current User    Types: Chew  . Alcohol Use: Not on file      Review of Systems  Constitutional: Negative for fever.  HENT: Positive for ear pain. Negative for neck pain.   Skin: Positive for wound (laceration). Negative for color change.       Insect bites  Neurological: Negative for headaches.  All other systems reviewed and are negative.    Allergies  Review of patient's allergies indicates no known allergies.  Home Medications   Current Outpatient Rx  Name Route Sig Dispense Refill  . ACETAMINOPHEN 325 MG PO TABS Oral Take 2 tablets (650 mg total) by mouth every 4 (four) hours as needed for pain. 100 tablet 3  . FLUOXETINE HCL 40 MG PO CAPS Oral Take 1 capsule (40 mg total) by mouth daily. 30 capsule 3  . HYDROCHLOROTHIAZIDE 25 MG PO TABS Oral Take 1 tablet (25 mg total)  by mouth daily. 30 tablet 6  . LISINOPRIL 40 MG PO TABS Oral Take 1 tablet (40 mg total) by mouth daily. 90 tablet 3  . MAGNESIUM OXIDE 400 MG PO TABS Oral Take 400 mg by mouth daily.      Marland Kitchen SIMVASTATIN 20 MG PO TABS Oral Take 1 tablet (20 mg total) by mouth every evening. 30 tablet 11    BP 104/63  Pulse 83  Temp(Src) 98 F (36.7 C) (Oral)  Resp 16  SpO2 96%  Physical Exam  Constitutional: He is oriented to person, place, and time. He appears well-developed and well-nourished.  HENT:  Head: Normocephalic and atraumatic.  Left Ear: Left ear exhibits lacerations.  Ears:  Eyes: Pupils are equal, round, and reactive to light.  Neurological: He is alert and oriented to person, place, and time.  Skin: Skin is warm and dry. Rash noted. No erythema. No pallor.        Insect bite wounds over her entire body. Patient has multiple excoriations. Does not appear to have infections, cellulitis, purulent drainage, erythema. Malodorous  Psychiatric: He has a normal mood and affect. His behavior is normal.       Intoxicated    ED Course  Procedures  Results for orders placed in visit on  11/11/11  POCT GLYCOSYLATED HEMOGLOBIN (HGB A1C)      Component Value Range   Hemoglobin A1C 5.6    GLUCOSE, CAPILLARY      Component Value Range   Glucose-Capillary 92  70 - 99 (mg/dL)  MAGNESIUM      Component Value Range   Magnesium 1.9  1.5 - 2.5 (mg/dL)  COMPLETE METABOLIC PANEL WITH GFR      Component Value Range   Sodium 136  135 - 145 (mEq/L)   Potassium 4.1  3.5 - 5.3 (mEq/L)   Chloride 106  96 - 112 (mEq/L)   CO2 19  19 - 32 (mEq/L)   Glucose, Bld 94  70 - 99 (mg/dL)   BUN 11  6 - 23 (mg/dL)   Creat 1.02  0.50 - 1.35 (mg/dL)   Total Bilirubin 0.5  0.3 - 1.2 (mg/dL)   Alkaline Phosphatase 59  39 - 117 (U/L)   AST 18  0 - 37 (U/L)   ALT 12  0 - 53 (U/L)   Total Protein 6.5  6.0 - 8.3 (g/dL)   Albumin 4.2  3.5 - 5.2 (g/dL)   Calcium 9.2  8.4 - 10.5 (mg/dL)   GFR, Est African American 87      GFR, Est Non African American 75    LIPID PANEL      Component Value Range   Cholesterol 175  0 - 200 (mg/dL)   Triglycerides 203 (*) <150 (mg/dL)   HDL 45  >39 (mg/dL)   Total CHOL/HDL Ratio 3.9     VLDL 41 (*) 0 - 40 (mg/dL)   LDL Cholesterol 89  0 - 99 (mg/dL)   Ct Head Wo Contrast  11/29/2011  *RADIOLOGY REPORT*  Clinical Data:  Status post fall with facial laceration. Intoxication.  CT HEAD WITHOUT CONTRAST CT CERVICAL SPINE WITHOUT CONTRAST  Technique:  Multidetector CT imaging of the head and cervical spine was performed following the standard protocol without intravenous contrast.  Multiplanar CT image reconstructions of the cervical spine were also generated.  Comparison:  Head CT 03/16/2011.  CT HEAD  Findings: There is no evidence of acute intracranial hemorrhage, mass lesion, brain edema or extra-axial fluid collection.  Mild atrophy and chronic small vessel ischemic changes appear stable. There is no evidence of acute cortical infarct.  There is new opacification of the right maxillary and ethmoid sinuses consistent with sinus disease.  No air-fluid levels are identified.  There are probable old fractures of the nasal bones and lamina papyracea on the left, unchanged.  No acute fractures are identified.  The mastoids appear clear.  IMPRESSION: No acute intracranial or calvarial findings.  Stable atrophy and chronic small vessel ischemic changes.  New right maxillary and ethmoid sinus opacification.  CT CERVICAL SPINE  Findings: The cervical alignment is stable and near anatomic. There is multilevel spondylosis with disc space loss, uncinate spurring and facet disease.  There is probable fusion across the C3- C4 disc and facet joints.  There is no evidence of acute fracture or traumatic subluxation.  No acute soft tissue findings are identified.  IMPRESSION: No evidence of acute cervical spine fracture, traumatic subluxation or static signs of instability.  Stable diffuse spondylosis.   Original Report Authenticated By: Vivia Ewing, M.D.   Ct Cervical Spine Wo Contrast  11/29/2011  *RADIOLOGY REPORT*  Clinical Data:  Status post fall with facial laceration. Intoxication.  CT HEAD WITHOUT CONTRAST CT CERVICAL SPINE WITHOUT CONTRAST  Technique:  Multidetector CT imaging  of the head and cervical spine was performed following the standard protocol without intravenous contrast.  Multiplanar CT image reconstructions of the cervical spine were also generated.  Comparison:  Head CT 03/16/2011.  CT HEAD  Findings: There is no evidence of acute intracranial hemorrhage, mass lesion, brain edema or extra-axial fluid collection.  Mild atrophy and chronic small vessel ischemic changes appear stable. There is no evidence of acute cortical infarct.  There is new opacification of the right maxillary and ethmoid sinuses consistent with sinus disease.  No air-fluid levels are identified.  There are probable old fractures of the nasal bones and lamina papyracea on the left, unchanged.  No acute fractures are identified.  The mastoids appear clear.  IMPRESSION: No acute intracranial or calvarial findings.  Stable atrophy and chronic small vessel ischemic changes.  New right maxillary and ethmoid sinus opacification.  CT CERVICAL SPINE  Findings: The cervical alignment is stable and near anatomic. There is multilevel spondylosis with disc space loss, uncinate spurring and facet disease.  There is probable fusion across the C3- C4 disc and facet joints.  There is no evidence of acute fracture or traumatic subluxation.  No acute soft tissue findings are identified.  IMPRESSION: No evidence of acute cervical spine fracture, traumatic subluxation or static signs of instability.  Stable diffuse spondylosis.  Original Report Authenticated By: Vivia Ewing, M.D.     MDM   Spoke with Marya Amsler from social work. He'll come speak with patient regarding his status. I discussed detox with patient but patient refuses  to have alcohol detox.  6:48 PM Dr. Constance Holster has seen the patient and sutured a laceration. Recommend patient followup in the office and has provided him with antibiotic ointment. Patient is ready for discharge.  Sheliah Mends, PA-C 11/29/11 1849

## 2011-11-29 NOTE — ED Notes (Signed)
AY:[id number] Expected date:11/29/11<BR> Expected time:12:09 PM<BR> Means of arrival:Ambulance<BR> Comments:<BR> EMS 61 GC- 66 y/o male ETOH with 1&quot; laceration to ear. Skin tear to elbow. Vitals WNL.

## 2011-11-29 NOTE — ED Notes (Signed)
PA at bedside suturing left ear.

## 2011-11-29 NOTE — ED Notes (Signed)
Dark green tube sent to mini lab. Lt green and lavender tubes sent to main lab.

## 2011-11-29 NOTE — ED Notes (Addendum)
Pt heavy drinker. Drinks a case of beer and a fifth of alcohol a day. Reports falling down and splitting his left ear lobe. Pt also has a skin tear on right elbow. [facility] reports pt covered in feces, urine, and cockroach bites.

## 2011-11-29 NOTE — ED Notes (Signed)
CSW met with pt and pt's neighbor by bedside per request of PA. Pt's neighbor informed CSW she found pt covered in his feces with numerous cockroach bites in his home. Pt's neighbor stated that pt does have a wife although she does not reside with him. Pt's neighbor reported that pt's wife handles his finances and is aware of his current living conditions. Pt's neighbor stated that pt has been living in those current conditions for several months now and that he is unable to maintain his home. Pt's neighbor did Artist that pt has a problem with alcohol and may be an alcoholic. CSW will initiate APS referral for further investigation.

## 2011-11-29 NOTE — ED Notes (Signed)
MD at bedside. Dr. Constance Holster at bedside.

## 2011-11-30 NOTE — ED Provider Notes (Signed)
Medical screening examination/treatment/procedure(s) were conducted as a shared visit with non-physician practitioner(s) and myself.  I personally evaluated the patient during the encounter This clinically intoxicated M p/w L ear laceration through the cartilage.  CT's negative for acute changes.  ENT treated the lesion and the patient was d/c w outpatient f/u.  Carmin Muskrat, MD 11/30/11 2342

## 2011-12-10 NOTE — Progress Notes (Signed)
Addended by: Hulan Fray on: 12/10/2011 07:05 AM   Modules accepted: Orders

## 2012-02-19 ENCOUNTER — Ambulatory Visit (INDEPENDENT_AMBULATORY_CARE_PROVIDER_SITE_OTHER): Payer: Medicare Other | Admitting: Internal Medicine

## 2012-02-19 ENCOUNTER — Encounter: Payer: Self-pay | Admitting: Internal Medicine

## 2012-02-19 VITALS — BP 140/80 | HR 68 | Temp 99.0°F | Ht 68.0 in | Wt 194.1 lb

## 2012-02-19 DIAGNOSIS — E119 Type 2 diabetes mellitus without complications: Secondary | ICD-10-CM | POA: Diagnosis not present

## 2012-02-19 DIAGNOSIS — R350 Frequency of micturition: Secondary | ICD-10-CM | POA: Diagnosis not present

## 2012-02-19 DIAGNOSIS — H9190 Unspecified hearing loss, unspecified ear: Secondary | ICD-10-CM

## 2012-02-19 DIAGNOSIS — E785 Hyperlipidemia, unspecified: Secondary | ICD-10-CM | POA: Diagnosis not present

## 2012-02-19 DIAGNOSIS — I10 Essential (primary) hypertension: Secondary | ICD-10-CM | POA: Diagnosis not present

## 2012-02-19 LAB — POCT GLYCOSYLATED HEMOGLOBIN (HGB A1C): Hemoglobin A1C: 6

## 2012-02-19 LAB — GLUCOSE, CAPILLARY: Glucose-Capillary: 155 mg/dL — ABNORMAL HIGH (ref 70–99)

## 2012-02-19 MED ORDER — OXYBUTYNIN CHLORIDE ER 5 MG PO TB24: 5.0000 mg | ORAL_TABLET | Freq: Every day | ORAL | Status: DC

## 2012-02-19 NOTE — Patient Instructions (Addendum)
You were seen today to meet your new doctor, Dr. Doug Sou and to check on your blood pressure. Your blood pressure is doing well today and we will not make any changes. If you need any refills or are sick please call our office. Our number is [phone]. We are not changing any of your medicines today. Keep working on exercising to keep losing weight.

## 2012-02-20 NOTE — Assessment & Plan Note (Signed)
Systolic blood pressure is 154 the beginning of our visit and was rechecked at the end of the visit and was 140/80. We'll not make any changes to regimen today and will strongly encourage him to continue taking his medication come back in 3 months for recheck.

## 2012-02-20 NOTE — Assessment & Plan Note (Signed)
Patient does have history of diabetes mellitus type 2 but does not have any medications on board and does have hemoglobin A1c are normal. Will possibly review records to see if he was on medication at one time he states that he cannot remember.

## 2012-02-20 NOTE — Assessment & Plan Note (Addendum)
Patient is able to understand and hear conversation but you have to speak slightly louder than usual.
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
## 2012-02-20 NOTE — Assessment & Plan Note (Signed)
Patient does take statin for his cholesterol. LDL is at goal.

## 2012-03-15 ENCOUNTER — Other Ambulatory Visit: Payer: Self-pay | Admitting: Internal Medicine

## 2012-03-20 ENCOUNTER — Other Ambulatory Visit: Payer: Self-pay | Admitting: Internal Medicine

## 2012-05-28 ENCOUNTER — Emergency Department (HOSPITAL_COMMUNITY): Payer: Medicare Other

## 2012-05-28 ENCOUNTER — Inpatient Hospital Stay (HOSPITAL_COMMUNITY)
Admission: EM | Admit: 2012-05-28 | Discharge: 2012-05-31 | DRG: 897 | Disposition: A | Discharge status: Home / Self Care | Payer: Medicare Other | Attending: Internal Medicine | Admitting: Internal Medicine

## 2012-05-28 ENCOUNTER — Encounter (HOSPITAL_COMMUNITY): Payer: Self-pay | Admitting: Emergency Medicine

## 2012-05-28 DIAGNOSIS — F1010 Alcohol abuse, uncomplicated: Secondary | ICD-10-CM

## 2012-05-28 DIAGNOSIS — Z23 Encounter for immunization: Secondary | ICD-10-CM

## 2012-05-28 DIAGNOSIS — F10939 Alcohol use, unspecified with withdrawal, unspecified: Principal | ICD-10-CM | POA: Diagnosis present

## 2012-05-28 DIAGNOSIS — F10239 Alcohol dependence with withdrawal, unspecified: Principal | ICD-10-CM | POA: Diagnosis present

## 2012-05-28 DIAGNOSIS — R569 Unspecified convulsions: Secondary | ICD-10-CM | POA: Diagnosis not present

## 2012-05-28 DIAGNOSIS — N179 Acute kidney failure, unspecified: Secondary | ICD-10-CM | POA: Diagnosis present

## 2012-05-28 DIAGNOSIS — F1020 Alcohol dependence, uncomplicated: Secondary | ICD-10-CM | POA: Diagnosis present

## 2012-05-28 DIAGNOSIS — I10 Essential (primary) hypertension: Secondary | ICD-10-CM | POA: Diagnosis not present

## 2012-05-28 DIAGNOSIS — E119 Type 2 diabetes mellitus without complications: Secondary | ICD-10-CM | POA: Diagnosis not present

## 2012-05-28 DIAGNOSIS — F17200 Nicotine dependence, unspecified, uncomplicated: Secondary | ICD-10-CM | POA: Diagnosis present

## 2012-05-28 DIAGNOSIS — E785 Hyperlipidemia, unspecified: Secondary | ICD-10-CM | POA: Diagnosis present

## 2012-05-28 DIAGNOSIS — G319 Degenerative disease of nervous system, unspecified: Secondary | ICD-10-CM | POA: Diagnosis not present

## 2012-05-28 DIAGNOSIS — E871 Hypo-osmolality and hyponatremia: Secondary | ICD-10-CM | POA: Diagnosis present

## 2012-05-28 DIAGNOSIS — R000 Tachycardia, unspecified: Secondary | ICD-10-CM | POA: Diagnosis present

## 2012-05-28 DIAGNOSIS — I6789 Other cerebrovascular disease: Secondary | ICD-10-CM | POA: Diagnosis not present

## 2012-05-28 HISTORY — DX: Alcohol dependence with withdrawal, unspecified: F10.239

## 2012-05-28 HISTORY — DX: Unspecified convulsions: R56.9

## 2012-05-28 LAB — URINALYSIS, ROUTINE W REFLEX MICROSCOPIC
Ketones, ur: NEGATIVE mg/dL
Leukocytes, UA: NEGATIVE
Nitrite: NEGATIVE
Protein, ur: NEGATIVE mg/dL
Urobilinogen, UA: 0.2 mg/dL (ref 0.0–1.0)

## 2012-05-28 LAB — CBC
Hemoglobin: 12.6 g/dL — ABNORMAL LOW (ref 13.0–17.0)
MCHC: 34.1 g/dL (ref 30.0–36.0)

## 2012-05-28 LAB — COMPREHENSIVE METABOLIC PANEL
ALT: 13 U/L (ref 0–53)
Alkaline Phosphatase: 62 U/L (ref 39–117)
BUN: 13 mg/dL (ref 6–23)
Chloride: 97 mEq/L (ref 96–112)
GFR calc Af Amer: 54 mL/min — ABNORMAL LOW (ref >90)
Glucose, Bld: 188 mg/dL — ABNORMAL HIGH (ref 70–99)
Potassium: 4.8 mEq/L (ref 3.5–5.1)
Sodium: 131 mEq/L — ABNORMAL LOW (ref 135–145)
Total Bilirubin: 0.4 mg/dL (ref 0.3–1.2)
Total Protein: 6.8 g/dL (ref 6.0–8.3)

## 2012-05-28 LAB — CBC WITH DIFFERENTIAL/PLATELET
Basophils Absolute: 0 10*3/uL (ref 0.0–0.1)
Basophils Relative: 0 % (ref 0–1)
Eosinophils Relative: 0 % (ref 0–5)
HCT: 36.3 % — ABNORMAL LOW (ref 39.0–52.0)
Lymphocytes Relative: 4 % — ABNORMAL LOW (ref 12–46)
MCHC: 34.7 g/dL (ref 30.0–36.0)
MCV: 95.8 fL (ref 78.0–100.0)
Monocytes Absolute: 0.5 10*3/uL (ref 0.1–1.0)
Platelets: 156 10*3/uL (ref 150–400)
RDW: 13.1 % (ref 11.5–15.5)
WBC: 10.2 10*3/uL (ref 4.0–10.5)

## 2012-05-28 LAB — RAPID URINE DRUG SCREEN, HOSP PERFORMED
Amphetamines: NOT DETECTED
Barbiturates: NOT DETECTED
Tetrahydrocannabinol: NOT DETECTED

## 2012-05-28 LAB — ETHANOL: Alcohol, Ethyl (B): <11 mg/dL (ref 0–11)

## 2012-05-28 LAB — SODIUM, URINE, RANDOM: Sodium, Ur: 136 mEq/L

## 2012-05-28 LAB — CREATININE, SERUM
Creatinine, Ser: 1.27 mg/dL (ref 0.50–1.35)
GFR calc non Af Amer: 56 mL/min — ABNORMAL LOW (ref >90)

## 2012-05-28 LAB — PROTIME-INR: Prothrombin Time: 14.8 seconds (ref 11.6–15.2)

## 2012-05-28 MED ORDER — SODIUM CHLORIDE 0.9 % IV BOLUS (SEPSIS)
1000.0000 mL | Freq: Once | INTRAVENOUS | Status: AC
  Administered 2012-05-28: 1000 mL via INTRAVENOUS

## 2012-05-28 MED ORDER — HEPARIN SODIUM (PORCINE) 5000 UNIT/ML IJ SOLN
5000.0000 [IU] | Freq: Three times a day (TID) | INTRAMUSCULAR | Status: DC
  Administered 2012-05-28 – 2012-05-31 (×7): 5000 [IU] via SUBCUTANEOUS
  Filled 2012-05-28 (×11): qty 1

## 2012-05-28 MED ORDER — ONDANSETRON HCL 4 MG/2ML IJ SOLN: 4.0000 mg | Freq: Four times a day (QID) | INTRAMUSCULAR | Status: DC | PRN

## 2012-05-28 MED ORDER — SIMVASTATIN 20 MG PO TABS
20.0000 mg | ORAL_TABLET | Freq: Every evening | ORAL | Status: DC
  Administered 2012-05-28 – 2012-05-30 (×3): 20 mg via ORAL
  Filled 2012-05-28 (×4): qty 1

## 2012-05-28 MED ORDER — INSULIN ASPART 100 UNIT/ML ~~LOC~~ SOLN
0.0000 [IU] | SUBCUTANEOUS | Status: DC
  Administered 2012-05-29: 1 [IU] via SUBCUTANEOUS
  Administered 2012-05-29: 2 [IU] via SUBCUTANEOUS
  Administered 2012-05-29 (×2): 1 [IU] via SUBCUTANEOUS
  Administered 2012-05-29: 2 [IU] via SUBCUTANEOUS
  Administered 2012-05-30 (×2): 1 [IU] via SUBCUTANEOUS
  Administered 2012-05-30 (×2): 2 [IU] via SUBCUTANEOUS
  Administered 2012-05-30 – 2012-05-31 (×2): 1 [IU] via SUBCUTANEOUS

## 2012-05-28 MED ORDER — THIAMINE HCL 100 MG/ML IJ SOLN
100.0000 mg | Freq: Once | INTRAMUSCULAR | Status: AC
  Administered 2012-05-28: 100 mg via INTRAVENOUS
  Filled 2012-05-28: qty 2

## 2012-05-28 MED ORDER — FLUOXETINE HCL 20 MG PO CAPS
40.0000 mg | ORAL_CAPSULE | Freq: Every day | ORAL | Status: DC
  Administered 2012-05-29 – 2012-05-31 (×3): 40 mg via ORAL
  Filled 2012-05-28 (×3): qty 2

## 2012-05-28 MED ORDER — LORAZEPAM 2 MG/ML IJ SOLN
1.0000 mg | Freq: Once | INTRAMUSCULAR | Status: AC
  Administered 2012-05-28: 1 mg via INTRAVENOUS
  Filled 2012-05-28: qty 1

## 2012-05-28 MED ORDER — THIAMINE HCL 100 MG/ML IJ SOLN
100.0000 mg | Freq: Every day | INTRAMUSCULAR | Status: DC
  Administered 2012-05-28 – 2012-05-29 (×2): 100 mg via INTRAVENOUS
  Filled 2012-05-28 (×2): qty 1

## 2012-05-28 MED ORDER — OXYBUTYNIN CHLORIDE ER 5 MG PO TB24
5.0000 mg | ORAL_TABLET | Freq: Every day | ORAL | Status: DC
  Administered 2012-05-29 – 2012-05-31 (×3): 5 mg via ORAL
  Filled 2012-05-28 (×3): qty 1

## 2012-05-28 MED ORDER — FOLIC ACID 5 MG/ML IJ SOLN
1.0000 mg | Freq: Every day | INTRAMUSCULAR | Status: DC
  Administered 2012-05-28 – 2012-05-29 (×2): 1 mg via INTRAVENOUS
  Filled 2012-05-28 (×2): qty 0.2

## 2012-05-28 MED ORDER — ONDANSETRON HCL 4 MG PO TABS: 4.0000 mg | ORAL_TABLET | Freq: Four times a day (QID) | ORAL | Status: DC | PRN

## 2012-05-28 MED ORDER — SODIUM CHLORIDE 0.9 % IV SOLN: INTRAVENOUS | Status: DC

## 2012-05-28 MED ORDER — FLUOXETINE HCL 40 MG PO CAPS: 40.0000 mg | ORAL_CAPSULE | Freq: Every day | ORAL | Status: DC

## 2012-05-28 MED ORDER — ACETAMINOPHEN 650 MG RE SUPP
650.0000 mg | Freq: Once | RECTAL | Status: AC
  Administered 2012-05-28: 650 mg via RECTAL
  Filled 2012-05-28: qty 1

## 2012-05-28 NOTE — ED Notes (Signed)
Family at bedside. 

## 2012-05-28 NOTE — ED Provider Notes (Signed)
I saw and evaluated the patient, reviewed the resident's note and I agree with the findings and plan.  Pt w/ hx of alcoholism comes in with cc of seizure. Pt noted to have mild fever, tachycardia. Pt's exam shows AMS - likely post ictal. No nuchal rigidity. Pt's CT is normal, and the initial screening labs are pretty benign. DTs are on the ddx along with the following -Seizure disorder -Meningitis -encephalitis -Trauma -ICH -Electrolyte abnormality -Metabolic derangement -Stroke -Toxin induced seizures -Medication side effects -Hypoxia -Hypoglycemia  And outside of infectious etiology, everything else is ruled out. In the current setting, our pretest probability for meningeal process is low. Upon speaking to the hospitalist, we indicated our willingness to get LP if they would like one-  But they were not keen on getting one either. We will not give any meningitis meds either. Admission - DTs vs. Alcohol withdrawal seizures the working diagnosis.    Varney Biles, MD 05/28/12 [phone]

## 2012-05-28 NOTE — ED Notes (Signed)
Pt taken to CT with RN and monitor.

## 2012-05-28 NOTE — ED Notes (Signed)
Call pt's cousin, Briscoe Deutscher, at [phone] when pt arrives on floor

## 2012-05-28 NOTE — H&P (Signed)
Hospital Admission Note Date: 05/28/2012  Patient name: William [name] Medical record number: [id number] Date of birth: [birth year] Age: 66 y.o. Gender: male PCP: Vertell Novak, MD  Medical Service: Internal Medicine Teaching Service  Attending physician:  Dr. Bertha Stakes    1st Contact:  Dr. Hayes Ludwig   [phone] 2nd Contact:  Dr. Obie Dredge   [id number] After 5 pm or weekends: 1st Contact:      Pager: [phone] 2nd Contact:      Pager: [phone]  Chief Complaint: seizure  History of Present Illness: William [name] is a 66 yo man with PMH of DM, HTN, HLP, alcohol abuse who presents to the ED via EMS for seizure. The patient was unable to provide much history at the time of this interview, with his stepdaughter unreachable by phone. Most of this HPI comes from the ED notes. He was in his usual state of health until today when his step-daughter found him grunting, drooling, with eyes rolled back and shaking of his arms and legs. Per his step daughter, this lasted for almost 30 minutes. CBG at the time of his arrival was 241, bowel or bladder incontinence were unknown, with not trauma or vomiting noted. Per his stepdaughter's original reports, the patient drinks at least a case of beers per day and his last drink was 3 days ago. At the ED she also reported that a window had fallen on the patient's head the day prior to his admission.   Upon arrival to the ED he was given Versed 5mg , with no seizure activity noted. He remained agitated, and tachycardic at the ED and was given 1mg  of Ativan, plus a thiamine injection. His agitation improved though he remained confused but able to follow simple commands by the time the IM team arrived.   Meds: Current Outpatient Rx  Name Route Sig Dispense Refill  . FLUOXETINE HCL 40 MG PO CAPS Oral Take 40 mg by mouth daily.    Marland Kitchen HYDROCHLOROTHIAZIDE 25 MG PO TABS Oral Take 1 tablet (25 mg total) by mouth daily. 30 tablet 6  . IBUPROFEN 200 MG PO TABS  Oral Take 200-800 mg by mouth every 6 (six) hours as needed. For pain    . LISINOPRIL 40 MG PO TABS Oral Take 1 tablet (40 mg total) by mouth daily. 90 tablet 3  . LOPERAMIDE HCL 2 MG PO CAPS Oral Take 2 mg by mouth 4 (four) times daily as needed. For diarrhea    . OXYBUTYNIN CHLORIDE ER 5 MG PO TB24 Oral Take 5 mg by mouth daily.    Marland Kitchen SIMVASTATIN 20 MG PO TABS Oral Take 1 tablet (20 mg total) by mouth every evening. 30 tablet 11    Allergies: Allergies as of 05/28/2012  . (No Known Allergies)   Past Medical History  Diagnosis Date  . Diabetes mellitus type II   . Hyperlipemia   . Anxiety   . Alcohol abuse   . Hearing impairment   . HTN (hypertension)   . Cataract    History reviewed. No pertinent past surgical history. No family history on file. History   Social History  . Marital Status: Single    Spouse Name: N/A    Number of Children: N/A  . Years of Education: N/A   Occupational History  . Not on file.   Social History Main Topics  . Smoking status: Current Everyday Smoker    Types: Cigarettes  . Smokeless tobacco: Current User    Types: Chew  . Alcohol  Use: Yes  . Drug Use: Not on file  . Sexually Active: Not on file    Review of Systems: Unable to assess at the time of admission as the patient was moderately confused with no family or friends present of reachable by phone.   Physical Exam: Blood pressure 142/81, pulse 100, temperature 100.5 F (38.1 C), temperature source Rectal, resp. rate 18, SpO2 99.00%. General: confused but open eyes when called by name, follows some commands.  Head: Several ~1cm superficial cuts on his scalp. Hair shaved on anterior calvarium only.  Eyes: pupils equal, pupils round, pupils reactive to light, no injection and anicteric.  Mouth: pharynx pink and moist, no erythema, and no exudates. edentelous. Neck: supple, full ROM, no thyromegaly,  Lungs: normal respiratory effort, no accessory muscle use, normal breath sounds, no  crackles, and no wheezes. Heart: normal rate, regular rhythm, no murmur, no gallop, and no rub.  Abdomen: soft, non-tender, normal bowel sounds, no distention, no guarding, no rebound tenderness.  Msk: no joint swelling, no joint warmth, and no redness over joints.  Pulses: 2+ DP/PT pulses bilaterally Extremities: No cyanosis, clubbing, edema Neurologic: alert, moderately confused, yawning, unable to asses orientation, cranial nerves grossly intact, unable to assess strength and sensation secondary to patient's moderate confusion.  Skin: turgor normal and no rashes.    Lab results: Basic Metabolic Panel:  [facility] 05/28/12 0928  NA 131*  K 4.8  CL 97  CO2 22  GLUCOSE 188*  BUN 13  CREATININE 1.49*  CALCIUM 9.3  MG --  PHOS --   Liver Function Tests:  [facility] 05/28/12 0928  AST 21  ALT 13  ALKPHOS 62  BILITOT 0.4  PROT 6.8  ALBUMIN 3.7   CBC:  [facility] 05/28/12 0928  WBC 10.2  NEUTROABS 9.2*  HGB 12.6*  HCT 36.3*  MCV 95.8  PLT 156   Coagulation:  Basename 05/28/12 0928  LABPROT 14.8  INR 1.14   Urine Drug Screen: Drugs of Abuse     Component Value Date/Time   LABOPIA NONE DETECTED 05/28/2012 0944   COCAINSCRNUR NONE DETECTED 05/28/2012 0944   LABBENZ POSITIVE* 05/28/2012 0944   AMPHETMU NONE DETECTED 05/28/2012 0944   THCU NONE DETECTED 05/28/2012 0944   LABBARB NONE DETECTED 05/28/2012 0944    Alcohol Level:  Basename 05/28/12 0928  ETH <11   Urinalysis:  Basename 05/28/12 0944  COLORURINE YELLOW  LABSPEC 1.015  PHURINE 5.0  GLUCOSEU 500*  HGBUR NEGATIVE  BILIRUBINUR NEGATIVE  KETONESUR NEGATIVE  PROTEINUR NEGATIVE  UROBILINOGEN 0.2  NITRITE NEGATIVE  LEUKOCYTESUR NEGATIVE    Imaging results:  Ct Head Wo Contrast  05/28/2012  *RADIOLOGY REPORT*  Clinical Data: Seizure  CT HEAD WITHOUT CONTRAST  Technique:  Contiguous axial images were obtained from the base of the skull through the vertex without contrast.  Comparison: 11/29/2011   Findings: Moderate, generalized atrophy.  Mild periventricular white matter hypodensity is unchanged and appears chronic. Negative for acute infarct.  Negative for hemorrhage or mass.  Chronic sinusitis with opacification of the right maxillary sinus, unchanged.  Calvarium intact.  IMPRESSION: Atrophy and mild chronic microvascular ischemic changes in the white matter.  No acute abnormality.  Chronic sinusitis.   Original Report Authenticated By: Truett Perna, M.D.    Dg Chest Portable 1 View  05/28/2012  *RADIOLOGY REPORT*  Clinical Data: Seizures.  Diabetes.  Hypertension.  PORTABLE CHEST - 1 VIEW  Comparison: 07/02/2004  Findings: Upper normal heart size noted.  Old left-sided rib fractures  are present along with evidence of cervical spondylosis. Mild aortic tortuosity is present.  The lungs appear clear.  IMPRESSION:  1.  Old left-sided rib fractures. 2.  No acute findings. 3.  Cervical spondylosis.   Original Report Authenticated By: Carron Curie, M.D.     Other results: EKG: Sinus tachycardia, RSR' or QR pattern in V1 suggests right ventricular conduction delay, Lateral Q waves noted  Assessment & Plan by Problem: 66 year-old man with PMH significant for DM2, HLP, EhtOH abuse, who presented to ED via EMS for evaluation and treatment of witnessed seizure  1. Seizure: At his presentation it is unclear if this is new onset seizure. The patient has history of alcoholism with last drink more than three days ago and this certainly could be secondary to alcohol withdrawal. His stepdaughter also reported head injury as recent as yesterday after a faulty window fall on the patient's head. His CT head without contrast, however, is negative for acute abnormalities, making this less likely seizure secondary to head trauma.  His treatment goal will be focused on prevention of recurrent seizure with alcohol cessation counseling with close monitoring for alcohol withdrawal.  -Admit to  Stepdown -Seizure precautions -CIWA protocol -thiamine/folate/MV -Neurology consulted. Appreciate recommendations.   2. Hyponatremia: Corrected Na 132.4, likely hypovolemic hyponatremia . Will get Urine Na and Osm -Continue IVF hydration and follow BMP  3. Acute Renal Failure: baseline Cr 1.0.  Cr today is 1.49, likely pre-renal. FENa will not be helpful as patient has been on diuretics at home.  4. Fever: T max 100.5, unclear etiology but likely 2/2 seizure. Urinalysis is negative for leukocytes or nitrites.  CXR is negative for any infiltrates.No neck stiffness at presentation, less likely meningitis, will reassess need for LP.  -Ordered urine culture and blood cultures X2.   5. DM II: diet control at home, last HbA1c in 01/2012 was 6.0. Glucose is 188.  Will repeat HbA1c today.  Will give SSI-sensitive while in hospital  6. HTN: well controlled, will hold ACEi in setting of acute renal failure and hctz in setting of volume contraction.    7. Tachycardia: likely to to agitation and stress/seizure. No clear source of infection. Will continue to monitor with telemetry.   8. Alcohol Abuse: CIWA protocol, IV thiamine and folate. Alcohol cessation counseling  9. HLP: well controlled, lipid panel in 2/213 showed LDL of 89. Will continue Zocor 20mg  qhs  DVT ppx: Heparin SQ TID   Signed: HO,MICHELE 05/28/2012, 12:43 PM

## 2012-05-28 NOTE — ED Notes (Signed)
All of pt's belongings given to step daughter

## 2012-05-28 NOTE — ED Notes (Signed)
Family at bedside.  Updated on pt's condition

## 2012-05-28 NOTE — ED Notes (Signed)
To ED from home via EMS, called for seizure - pt actively seizing on EMS arrival, given 5mg  Versed, no further seizure activity, CBG 241, unknown incontenence, no vomiting or oral trauma noted, sats 99% on room, air on arrival, 18 LAC,. EMS reports hx of alcoholism with last drink 3d ago, NAD

## 2012-05-28 NOTE — ED Notes (Signed)
William [name], step daughter [phone]

## 2012-05-28 NOTE — Progress Notes (Signed)
Pt admitted to [facility], Pt oriented to room and unit routines.

## 2012-05-28 NOTE — ED Notes (Signed)
Vital signs stable. 

## 2012-05-28 NOTE — ED Provider Notes (Signed)
History     CSN: [id number]  Arrival date & time 05/28/12  0848   First MD Initiated Contact with Patient 05/28/12 [phone]      Chief Complaint  Patient presents with  . Seizures    HPI 66 yo male with h/o DM2, HTN, alcohol abuse who arrived to ED from home via EMS, family member called for seizure - pt actively seizing on EMS arrival, given 5mg  Versed, no further seizure activity. Per his daughter's report, she found him grunting, drooling, with eyes rolled back and shaking of his arms and legs. Daughter states that the episode lasted about 30 minutes. CBG was 241, unknown incontinence, no vomiting or oral trauma noted. His daughter reports that he normally drinks a case of beers per day. Last EtOH drink was 3 days ago. She also reports that a malfunctioning window dropped on his head yesterday, following which he had a headache.    Past Medical History  Diagnosis Date  . Diabetes mellitus type II   . Hyperlipemia   . Anxiety   . Alcohol abuse   . Hearing impairment   . HTN (hypertension)   . Cataract     History reviewed. No pertinent past surgical history.  No family history on file.  History  Substance Use Topics  . Smoking status: Current Everyday Smoker    Types: Cigarettes  . Smokeless tobacco: Current User    Types: Chew  . Alcohol Use: Yes      Review of Systems  All other systems reviewed and are negative.    Allergies  Review of patient's allergies indicates no known allergies.  Home Medications   Current Outpatient Rx  Name Route Sig Dispense Refill  . FLUOXETINE HCL 40 MG PO CAPS Oral Take 40 mg by mouth daily.    Marland Kitchen HYDROCHLOROTHIAZIDE 25 MG PO TABS Oral Take 1 tablet (25 mg total) by mouth daily. 30 tablet 6  . IBUPROFEN 200 MG PO TABS Oral Take 200-800 mg by mouth every 6 (six) hours as needed. For pain    . LISINOPRIL 40 MG PO TABS Oral Take 1 tablet (40 mg total) by mouth daily. 90 tablet 3  . LOPERAMIDE HCL 2 MG PO CAPS Oral Take 2 mg by mouth  4 (four) times daily as needed. For diarrhea    . OXYBUTYNIN CHLORIDE ER 5 MG PO TB24 Oral Take 5 mg by mouth daily.    Marland Kitchen SIMVASTATIN 20 MG PO TABS Oral Take 1 tablet (20 mg total) by mouth every evening. 30 tablet 11    BP 121/68  Pulse 66  Temp 100.5 F (38.1 C) (Rectal)  Resp 16  SpO2 98%  Physical Exam  Constitutional:       Sleepy. Responds to his name.   HENT:  Head: Normocephalic and atraumatic.       Sluggish pupil response. Horizontal nystagmus present.   Pulmonary/Chest: Effort normal and breath sounds normal. No respiratory distress.       Anterior exam: no wheezing or crackles appreciated  Abdominal: Soft. Bowel sounds are normal. He exhibits no distension. There is no tenderness.  Musculoskeletal: He exhibits no edema.  Neurological:       GCS: 9 (3 for eyes, 2 for speech, 4 for motor). Grunts. Follows commands.   Skin: Skin is warm and dry. No rash noted.    ED Course  Procedures (including critical care time)  Date: 05/28/2012  Rate: 112  Rhythm: normal sinus rhythm with ventricular trigeminy  QRS  Axis: normal  Intervals: normal  ST/T Wave abnormalities: no new ST elevation or T wave depression  Conduction Disutrbances: none  Narrative Interpretation: PVC's  Old EKG Reviewed: none available    Labs Reviewed  URINALYSIS, ROUTINE W REFLEX MICROSCOPIC - Abnormal; Notable for the following:    Glucose, UA 500 (*)     All other components within normal limits  COMPREHENSIVE METABOLIC PANEL - Abnormal; Notable for the following:    Sodium 131 (*)     Glucose, Bld 188 (*)     Creatinine, Ser 1.49 (*)     GFR calc non Af Amer 46 (*)     GFR calc Af Amer 54 (*)     All other components within normal limits  CBC WITH DIFFERENTIAL - Abnormal; Notable for the following:    RBC 3.79 (*)     Hemoglobin 12.6 (*)     HCT 36.3 (*)     Neutrophils Relative 90 (*)     Neutro Abs 9.2 (*)     Lymphocytes Relative 4 (*)     Lymphs Abs 0.4 (*)     All other  components within normal limits  URINE RAPID DRUG SCREEN (HOSP PERFORMED) - Abnormal; Notable for the following:    Benzodiazepines POSITIVE (*)     All other components within normal limits  PROTIME-INR  APTT  ETHANOL  URINE CULTURE   Ct Head Wo Contrast  05/28/2012  *RADIOLOGY REPORT*  Clinical Data: Seizure  CT HEAD WITHOUT CONTRAST  Technique:  Contiguous axial images were obtained from the base of the skull through the vertex without contrast.  Comparison: 11/29/2011  Findings: Moderate, generalized atrophy.  Mild periventricular white matter hypodensity is unchanged and appears chronic. Negative for acute infarct.  Negative for hemorrhage or mass.  Chronic sinusitis with opacification of the right maxillary sinus, unchanged.  Calvarium intact.  IMPRESSION: Atrophy and mild chronic microvascular ischemic changes in the white matter.  No acute abnormality.  Chronic sinusitis.   Original Report Authenticated By: Truett Perna, M.D.    Dg Chest Portable 1 View  05/28/2012  *RADIOLOGY REPORT*  Clinical Data: Seizures.  Diabetes.  Hypertension.  PORTABLE CHEST - 1 VIEW  Comparison: 07/02/2004  Findings: Upper normal heart size noted.  Old left-sided rib fractures are present along with evidence of cervical spondylosis. Mild aortic tortuosity is present.  The lungs appear clear.  IMPRESSION:  1.  Old left-sided rib fractures. 2.  No acute findings. 3.  Cervical spondylosis.   Original Report Authenticated By: Carron Curie, M.D.     1. Seizure 2. Acute Renal Failure   MDM  Vital signs stable. O2 sats:99% on room air. Head CT, CXR, CBC, CMP, UA, UCx, Utox, EtOH level, coags ordered  Re-evaluated patient who appeared more agitated, trying to get out of bed. Gave 1mg  ativan for agitation. Vital signs significant for stable tachycardia. Patient able to follow command, open eyes when called by name, moves extremities spontaneously.  Seizure likely from EtOH withdrawal.  Admit patient to  internal medicine teaching service. Discussed need for LP with resident and decided against it for now.   Placed neurology consult but was unable to reach team       Kandis Nab, MD 05/28/12 [phone]

## 2012-05-28 NOTE — Consult Note (Signed)
Reason for Consult:Seizure Referring Physician: Joines  CC: Seizure   HPI: William [name] is an 66 y.o. male that is unable to give any history this evening.  All history id obtained from the chart.  It seems that the patient was found today by his step-daughter grunting, drooling, with eyes rolled back and shaking his arms and legs.  Episode lasted for about 30 minutes.  Patient was brought to the ED by EMS.  Was actively seizing on EMS arrival and patient was given 5mg  of Versed.  This aborted the seizure actvity.   Patient has a history of heavy drinking-drinks a case of beers a day.  Last drink was 3 days ago.  ETOH on arrival was less than 11.    Past Medical History  Diagnosis Date  . Diabetes mellitus type II   . Hyperlipemia   . Anxiety   . Alcohol abuse   . Hearing impairment   . HTN (hypertension)   . Cataract     History reviewed. No pertinent past surgical history.  No family history on file.  Social History:  reports that he has been smoking Cigarettes.  His smokeless tobacco use includes Chew. He reports that he drinks alcohol. His drug history not on file.  No Known Allergies  Medications:  I have reviewed the patient's current medications. Prior to Admission:  Prescriptions prior to admission  Medication Sig Dispense Refill  . FLUoxetine (PROZAC) 40 MG capsule Take 40 mg by mouth daily.      . hydrochlorothiazide (HYDRODIURIL) 25 MG tablet Take 1 tablet (25 mg total) by mouth daily.  30 tablet  6  . ibuprofen (ADVIL,MOTRIN) 200 MG tablet Take 200-800 mg by mouth every 6 (six) hours as needed. For pain      . lisinopril (PRINIVIL,ZESTRIL) 40 MG tablet Take 1 tablet (40 mg total) by mouth daily.  90 tablet  3  . loperamide (IMODIUM) 2 MG capsule Take 2 mg by mouth 4 (four) times daily as needed. For diarrhea      . oxybutynin (DITROPAN-XL) 5 MG 24 hr tablet Take 5 mg by mouth daily.      . simvastatin (ZOCOR) 20 MG tablet Take 1 tablet (20 mg total) by mouth  every evening.  30 tablet  11    ROS: History obtained from the patient  General ROS: negative for - chills, fatigue, fever, night sweats, weight gain or weight loss Psychological ROS: negative for - behavioral disorder, hallucinations, memory difficulties, mood swings or suicidal ideation Ophthalmic ROS: negative for - blurry vision, double vision, eye pain or loss of vision ENT ROS: negative for - epistaxis, nasal discharge, oral lesions, sore throat, tinnitus or vertigo Allergy and Immunology ROS: negative for - hives or itchy/watery eyes Hematological and Lymphatic ROS: negative for - bleeding problems, bruising or swollen lymph nodes Endocrine ROS: negative for - galactorrhea, hair pattern changes, polydipsia/polyuria or temperature intolerance Respiratory ROS: negative for - cough, hemoptysis, shortness of breath or wheezing Cardiovascular ROS: negative for - chest pain, dyspnea on exertion, edema or irregular heartbeat Gastrointestinal ROS: negative for - abdominal pain, diarrhea, hematemesis, nausea/vomiting or stool incontinence Genito-Urinary ROS: urinary incontinence at night Musculoskeletal ROS: negative for - joint swelling or muscular weakness Neurological ROS: as noted in HPI Dermatological ROS: negative for rash and skin lesion changes  Physical Examination: Blood pressure 143/68, pulse 77, temperature 98.2 F (36.8 C), temperature source Oral, resp. rate 21, SpO2 95.00%.  Neurologic Examination Mental Status: Alert, oriented.  Inappropriate  at times.  Speech fluent without evidence of aphasia.  Able to follow 3 step commands without difficulty. Cranial Nerves: II: Discs flat bilaterally; Visual fields grossly normal, pupils equal, round, reactive to light and accommodation III,IV, VI: ptosis not present, extra-ocular motions intact bilaterally V,VII: smile symmetric, facial light touch sensation normal bilaterally VIII: hearing normal bilaterally IX,X: gag reflex  present XI: bilateral shoulder shrug XII: midline tongue extension Motor: Right : Upper extremity   5/5    Left:     Upper extremity   5/5  Lower extremity   5/5     Lower extremity   5/5 Tone and bulk:normal tone throughout; no atrophy noted.  Arches high bilaterally Sensory: Pinprick and light touch intact throughout, bilaterally Deep Tendon Reflexes: 2+ in the upper extremities, 1+ at the knees and absent at the ankles.   Plantars: Right: downgoing   Left: downgoing Cerebellar: normal finger-to-nose and normal heel-to-shin test  Laboratory Studies:   Basic Metabolic Panel:  Lab [id number] 2027 05/28/12 0928  NA -- 131*  K -- 4.8  CL -- 97  CO2 -- 22  GLUCOSE -- 188*  BUN -- 13  CREATININE 1.27 1.49*  CALCIUM -- 9.3  MG -- --  PHOS -- --    Liver Function Tests:  Lab 05/28/12 0928  AST 21  ALT 13  ALKPHOS 62  BILITOT 0.4  PROT 6.8  ALBUMIN 3.7   No results found for this basename: LIPASE:5,AMYLASE:5 in the last 168 hours No results found for this basename: AMMONIA:3 in the last 168 hours  CBC:  Lab 05/28/12 2027 05/28/12 0928  WBC 6.4 10.2  NEUTROABS -- 9.2*  HGB 12.6* 12.6*  HCT 37.0* 36.3*  MCV 96.6 95.8  PLT 168 156    Cardiac Enzymes: No results found for this basename: CKTOTAL:5,CKMB:5,CKMBINDEX:5,TROPONINI:5 in the last 168 hours  BNP: No components found with this basename: POCBNP:5  CBG:  Lab 05/28/12 2003  GLUCAP 89    Microbiology: Results for orders placed during the hospital encounter of 10/21/09  URINE CULTURE     Status: Normal   Collection Time   10/20/09 11:14 PM      Component Value Range Status Comment   Specimen Description URINE, CLEAN CATCH   Final    Special Requests NONE   Final    Colony Count 15,000 COLONIES/ML   Final    Culture     Final    Value: Multiple bacterial morphotypes present, none predominant. Suggest appropriate recollection if clinically indicated.   Report Status 10/23/2009 FINAL   Final   URINE  CULTURE     Status: Normal   Collection Time   10/22/09  3:40 PM      Component Value Range Status Comment   Specimen Description URINE, RANDOM   Final    Special Requests IMMUNE:NORM UT SYMPT:NEG   Final    Colony Count >=100,000 COLONIES/ML   Final    Culture ENTEROCOCCUS SPECIES   Final    Report Status 10/24/2009 FINAL   Final    Organism ID, Bacteria ENTEROCOCCUS SPECIES   Final     Coagulation Studies:  Basename 05/28/12 0928  LABPROT 14.8  INR 1.14    Urinalysis:  Lab 05/28/12 0944  COLORURINE YELLOW  LABSPEC 1.015  PHURINE 5.0  GLUCOSEU 500*  HGBUR NEGATIVE  BILIRUBINUR NEGATIVE  KETONESUR NEGATIVE  PROTEINUR NEGATIVE  UROBILINOGEN 0.2  NITRITE NEGATIVE  LEUKOCYTESUR NEGATIVE    Lipid Panel:     Component Value Date/Time  CHOL 175 11/11/2011 1704   TRIG 203* 11/11/2011 1704   HDL 45 11/11/2011 1704   CHOLHDL 3.9 11/11/2011 1704   VLDL 41* 11/11/2011 1704   LDLCALC 89 11/11/2011 1704    HgbA1C:  Lab Results  Component Value Date   HGBA1C 6.0 02/19/2012    Urine Drug Screen:     Component Value Date/Time   LABOPIA NONE DETECTED 05/28/2012 0944   COCAINSCRNUR NONE DETECTED 05/28/2012 0944   LABBENZ POSITIVE* 05/28/2012 0944   AMPHETMU NONE DETECTED 05/28/2012 0944   THCU NONE DETECTED 05/28/2012 0944   LABBARB NONE DETECTED 05/28/2012 0944    Alcohol Level:  Lab 05/28/12 0928  ETH <11    Imaging: Ct Head Wo Contrast  05/28/2012  *RADIOLOGY REPORT*  Clinical Data: Seizure  CT HEAD WITHOUT CONTRAST  Technique:  Contiguous axial images were obtained from the base of the skull through the vertex without contrast.  Comparison: 11/29/2011  Findings: Moderate, generalized atrophy.  Mild periventricular white matter hypodensity is unchanged and appears chronic. Negative for acute infarct.  Negative for hemorrhage or mass.  Chronic sinusitis with opacification of the right maxillary sinus, unchanged.  Calvarium intact.  IMPRESSION: Atrophy and mild chronic  microvascular ischemic changes in the white matter.  No acute abnormality.  Chronic sinusitis.   Original Report Authenticated By: Truett Perna, M.D.    Dg Chest Portable 1 View  05/28/2012  *RADIOLOGY REPORT*  Clinical Data: Seizures.  Diabetes.  Hypertension.  PORTABLE CHEST - 1 VIEW  Comparison: 07/02/2004  Findings: Upper normal heart size noted.  Old left-sided rib fractures are present along with evidence of cervical spondylosis. Mild aortic tortuosity is present.  The lungs appear clear.  IMPRESSION:  1.  Old left-sided rib fractures. 2.  No acute findings. 3.  Cervical spondylosis.   Original Report Authenticated By: Carron Curie, M.D.      Assessment/Plan: 66 yo male presenting with new onset seizures.  Per patient report he is amnestic of the event but does not recall ever having had a seizure in the past.  Admits to drinking heavily.  CT unremarkable.  May very well have been an alcohol withdrawal seizure.  Will rule out other possible etiologies.    Recommendations: 1. Mg, Phos to be drawn with AM labs 2. MRI of the brain 3. EEG 4. No AED's at this time.  Would use Ativan prn if necessary 5. ETOH cessation counseling 6. Seizure precautions   Alexis Goodell, MD [facility] [phone] 05/28/2012, 11:51 PM

## 2012-05-29 ENCOUNTER — Encounter (HOSPITAL_COMMUNITY): Payer: Self-pay

## 2012-05-29 ENCOUNTER — Inpatient Hospital Stay (HOSPITAL_COMMUNITY): Payer: Medicare Other

## 2012-05-29 DIAGNOSIS — E785 Hyperlipidemia, unspecified: Secondary | ICD-10-CM | POA: Diagnosis not present

## 2012-05-29 DIAGNOSIS — R569 Unspecified convulsions: Secondary | ICD-10-CM | POA: Diagnosis not present

## 2012-05-29 DIAGNOSIS — G319 Degenerative disease of nervous system, unspecified: Secondary | ICD-10-CM | POA: Diagnosis not present

## 2012-05-29 LAB — GLUCOSE, CAPILLARY
Glucose-Capillary: 122 mg/dL — ABNORMAL HIGH (ref 70–99)
Glucose-Capillary: 146 mg/dL — ABNORMAL HIGH (ref 70–99)
Glucose-Capillary: 192 mg/dL — ABNORMAL HIGH (ref 70–99)
Glucose-Capillary: 186 mg/dL — ABNORMAL HIGH (ref 70–99)

## 2012-05-29 LAB — PHOSPHORUS: Phosphorus: 3.1 mg/dL (ref 2.3–4.6)

## 2012-05-29 LAB — HEMOGLOBIN A1C
Hgb A1c MFr Bld: 5.8 % — ABNORMAL HIGH (ref <5.7)
Mean Plasma Glucose: 120 mg/dL — ABNORMAL HIGH (ref <117)

## 2012-05-29 LAB — BASIC METABOLIC PANEL
Calcium: 9.6 mg/dL (ref 8.4–10.5)
Creatinine, Ser: 1.32 mg/dL (ref 0.50–1.35)
GFR calc Af Amer: 62 mL/min — ABNORMAL LOW (ref >90)
GFR calc non Af Amer: 54 mL/min — ABNORMAL LOW (ref >90)
Sodium: 136 mEq/L (ref 135–145)

## 2012-05-29 LAB — CBC
Platelets: 181 10*3/uL (ref 150–400)
RBC: 3.89 MIL/uL — ABNORMAL LOW (ref 4.22–5.81)
RDW: 13.5 % (ref 11.5–15.5)
WBC: 5.8 10*3/uL (ref 4.0–10.5)

## 2012-05-29 LAB — OSMOLALITY, URINE: Osmolality, Ur: 395 mOsm/kg (ref 390–1090)

## 2012-05-29 LAB — URINE CULTURE

## 2012-05-29 LAB — VITAMIN B12: Vitamin B-12: 645 pg/mL (ref 211–911)

## 2012-05-29 MED ORDER — FOLIC ACID 1 MG PO TABS
1.0000 mg | ORAL_TABLET | Freq: Every day | ORAL | Status: DC
  Administered 2012-05-30 – 2012-05-31 (×2): 1 mg via ORAL
  Filled 2012-05-29 (×2): qty 1

## 2012-05-29 MED ORDER — VITAMIN B-1 100 MG PO TABS
100.0000 mg | ORAL_TABLET | Freq: Every day | ORAL | Status: DC
  Administered 2012-05-30 – 2012-05-31 (×2): 100 mg via ORAL
  Filled 2012-05-29 (×2): qty 1

## 2012-05-29 MED ORDER — PNEUMOCOCCAL VAC POLYVALENT 25 MCG/0.5ML IJ INJ
0.5000 mL | INJECTION | INTRAMUSCULAR | Status: AC
  Administered 2012-05-30: 0.5 mL via INTRAMUSCULAR
  Filled 2012-05-29: qty 0.5

## 2012-05-29 MED ORDER — SODIUM CHLORIDE 0.9 % IV SOLN
INTRAVENOUS | Status: DC
  Administered 2012-05-29 – 2012-05-31 (×2): via INTRAVENOUS

## 2012-05-29 NOTE — H&P (Signed)
Internal Medicine Attending Admission Note Date: 05/29/2012  Patient name: William [name] Medical record number: [id number] Date of birth: Jul 13, 1946 Age: 66 y.o. Gender: male  I saw and evaluated the patient. I reviewed the resident's note and I agree with the resident's findings and plan as documented in the resident's note, with the following additional comments.  Chief Complaint(s): Seizure  History - key components related to admission: Patient is a 66 year old man with history of diabetes, hypertension, hyperlipidemia, and chronic heavy alcohol abuse admitted with seizure.  Patient reportedly last drank alcohol about 3 days prior to admission.  Today he is alert and without complaints.  He denies headache, chest pain, shortness of breath, nausea, vomiting, abdominal pain, or neurologic deficit.   Physical Exam - key components related to admission:  Filed Vitals:   05/29/12 0315 05/29/12 0317 05/29/12 0720 05/29/12 1300  BP: 125/88  126/94   Pulse: 75  74   Temp:  98.6 F (37 C) 98.2 F (36.8 C) 99.4 F (37.4 C)  TempSrc:  Oral Oral Oral  Resp: 13  19   Height:      Weight:  174 lb 6.1 oz (79.1 kg)    SpO2: 99%  98%    General: Alert, no distress Lungs: Clear Heart: Regular; S1-S2, no S3, no S4, no murmurs Abdomen: Bowel sounds present, soft, nontender Extremities: No edema  Lab results:   Basic Metabolic Panel:  Basename 05/29/12 0345 05/28/12 2027 05/28/12 0928  NA 136 -- 131*  K 4.2 -- 4.8  CL 102 -- 97  CO2 23 -- 22  GLUCOSE 108* -- 188*  BUN 12 -- 13  CREATININE 1.32 1.27 --  CALCIUM 9.6 -- 9.3  MG 1.6 -- --  PHOS 3.1 -- --   Liver Function Tests:  [facility] 05/28/12 0928  AST 21  ALT 13  ALKPHOS 62  BILITOT 0.4  PROT 6.8  ALBUMIN 3.7    CBC:  Basename 05/29/12 0345 05/28/12 2027 05/28/12 0928  WBC 5.8 6.4 --  NEUTROABS -- -- 9.2*  HGB 12.8* 12.6* --  HCT 37.7* 37.0* --  MCV 96.9 96.6 --  PLT 181 168 --    CBG:  Basename  05/29/12 1124 05/29/12 0722 05/29/12 0316 05/29/12 0005 05/28/12 2003  GLUCAP 146* 122* 117* 149* 89   Hemoglobin A1C:  Basename 05/28/12 2027  HGBA1C 5.8*    Coagulation:  Basename 05/28/12 0928  INR 1.14   Alcohol Level:  Basename 05/28/12 0928  ETH <11   Urinalysis    Component Value Date/Time   COLORURINE YELLOW 05/28/2012 [place] 05/28/2012 0944   LABSPEC 1.015 05/28/2012 0944   PHURINE 5.0 05/28/2012 0944   GLUCOSEU 500* 05/28/2012 0944   HGBUR NEGATIVE 05/28/2012 0944   BILIRUBINUR NEGATIVE 05/28/2012 0944   KETONESUR NEGATIVE 05/28/2012 0944   PROTEINUR NEGATIVE 05/28/2012 0944   UROBILINOGEN 0.2 05/28/2012 0944   NITRITE NEGATIVE 05/28/2012 0944   LEUKOCYTESUR NEGATIVE 05/28/2012 0944    Drugs of Abuse     Component Value Date/Time   LABOPIA NONE DETECTED 05/28/2012 0944   COCAINSCRNUR NONE DETECTED 05/28/2012 0944   LABBENZ POSITIVE* 05/28/2012 0944   AMPHETMU NONE DETECTED 05/28/2012 0944   THCU NONE DETECTED 05/28/2012 0944   LABBARB NONE DETECTED 05/28/2012 0944     Imaging results:  Ct Head Wo Contrast  05/28/2012  *RADIOLOGY REPORT*  Clinical Data: Seizure  CT HEAD WITHOUT CONTRAST  Technique:  Contiguous axial images were obtained from the base of  the skull through the vertex without contrast.  Comparison: 11/29/2011  Findings: Moderate, generalized atrophy.  Mild periventricular white matter hypodensity is unchanged and appears chronic. Negative for acute infarct.  Negative for hemorrhage or mass.  Chronic sinusitis with opacification of the right maxillary sinus, unchanged.  Calvarium intact.  IMPRESSION: Atrophy and mild chronic microvascular ischemic changes in the white matter.  No acute abnormality.  Chronic sinusitis.   Original Report Authenticated By: Truett Perna, M.D.    Dg Chest Portable 1 View  05/28/2012  *RADIOLOGY REPORT*  Clinical Data: Seizures.  Diabetes.  Hypertension.  PORTABLE CHEST - 1 VIEW  Comparison: 07/02/2004  Findings:  Upper normal heart size noted.  Old left-sided rib fractures are present along with evidence of cervical spondylosis. Mild aortic tortuosity is present.  The lungs appear clear.  IMPRESSION:  1.  Old left-sided rib fractures. 2.  No acute findings. 3.  Cervical spondylosis.   Original Report Authenticated By: Carron Curie, M.D.     Other results: EKG 05/28/2012: Sinus tachycardia; RSR' or QR pattern in V1 suggests right ventricular conduction delay; lateral Q waves noted EKG 05/29/2012: Normal sinus rhythm; nonspecific ST and T wave abnormality; since previous tracing incomplete RBBB is no longer seen   Assessment & Plan by Problem:  1.  Seizure.  This is likely an alcohol withdrawal seizure.  Plan is CIWA protocol; seizure precautions and supportive care; MRI of the brain; EEG; neurology consult has seen patient and is following.  2.  Alcoholism.  As above, CIWA protocol; thiamine/multivitamin/folate; social work consult for alcohol treatment options if patient is willing.  3.  Acute renal insufficiency.  Likely due to volume depletion.  Creatinine is improving today.  Plan is IV fluid, follow renal function, evaluate further if this does not resolve.  4.  Other problems as per resident physician note.

## 2012-05-29 NOTE — Progress Notes (Signed)
Subjective: He is feeling better today. He reports drinking at least once case of beers everyday but stopped 11 days ago. He has been drinking for years and did not remember having seizures or withdrawal from alcohol in the past.   He denies headache, neck stiffness, neck pain, chest pain, shortness of breath, abdominal pain, or dysuria.   Objective: Vital signs in last 24 hours: Filed Vitals:   05/29/12 1145 05/29/12 1146 05/29/12 1300 05/29/12 1550  BP: 93/67     Pulse: 103 94    Temp:   99.4 F (37.4 C) 98.9 F (37.2 C)  TempSrc:   Oral Oral  Resp: 22 17    Height:      Weight:      SpO2: 95% 72%     Weight change:   Intake/Output Summary (Last 24 hours) at 05/29/12 1620 Last data filed at 05/29/12 1400  Gross per 24 hour  Intake   1040 ml  Output    700 ml  Net    340 ml   Vitals reviewed. General: Sitting up in chair, conversant, smiling, joking, in NAD. Hard of hearing.  HEENT: PERRL, EOMI, no scleral icterus. Scattered ~1cm superficial cuts on his scalp with dry scabs.  Cardiac: RRR, no rubs, murmurs or gallops Pulm: clear to auscultation bilaterally, no wheezes, rales, or rhonchi Abd: soft, nontender, nondistended, BS present Ext: warm and well perfused, no pedal edema Neuro: alert and oriented X3, cranial nerves II-XII grossly intact, strength and sensation to light touch equal in bilateral upper and lower extremities  Lab Results: Basic Metabolic Panel:  Lab [id number] 05/28/12 2027 05/28/12 0928  NA 136 -- 131*  K 4.2 -- 4.8  CL 102 -- 97  CO2 23 -- 22  GLUCOSE 108* -- 188*  BUN 12 -- 13  CREATININE 1.32 1.27 --  CALCIUM 9.6 -- 9.3  MG 1.6 -- --  PHOS 3.1 -- --   Liver Function Tests:  Lab 05/28/12 0928  AST 21  ALT 13  ALKPHOS 62  BILITOT 0.4  PROT 6.8  ALBUMIN 3.7   CBC:  Lab 05/29/12 0345 05/28/12 2027 05/28/12 0928  WBC 5.8 6.4 --  NEUTROABS -- -- 9.2*  HGB 12.8* 12.6* --  HCT 37.7* 37.0* --  MCV 96.9 96.6 --  PLT 181 168 --    CBG:  Lab 05/29/12 1124 05/29/12 0722 05/29/12 0316 05/29/12 0005 05/28/12 2003  GLUCAP 146* 122* 117* 149* 89   Hemoglobin A1C:  Lab 05/28/12 2027  HGBA1C 5.8*   Coagulation:  Lab 05/28/12 0928  LABPROT 14.8  INR 1.14   Urine Drug Screen: Drugs of Abuse     Component Value Date/Time   LABOPIA NONE DETECTED 05/28/2012 0944   COCAINSCRNUR NONE DETECTED 05/28/2012 0944   LABBENZ POSITIVE* 05/28/2012 0944   AMPHETMU NONE DETECTED 05/28/2012 0944   THCU NONE DETECTED 05/28/2012 0944   LABBARB NONE DETECTED 05/28/2012 0944    Alcohol Level:  Lab 05/28/12 0928  ETH <11   Urinalysis:  Lab 05/28/12 0944  COLORURINE YELLOW  LABSPEC 1.015  PHURINE 5.0  GLUCOSEU 500*  HGBUR NEGATIVE  BILIRUBINUR NEGATIVE  KETONESUR NEGATIVE  PROTEINUR NEGATIVE  UROBILINOGEN 0.2  NITRITE NEGATIVE  LEUKOCYTESUR NEGATIVE    Micro Results: Recent Results (from the past 240 hour(s))  URINE CULTURE     Status: Normal   Collection Time   05/28/12  9:44 AM      Component Value Range Status Comment   Specimen Description URINE,  CATHETERIZED   Final    Special Requests NONE   Final    Culture  Setup Time 05/28/2012 11:18   Final    Colony Count NO GROWTH   Final    Culture NO GROWTH   Final    Report Status 05/29/2012 FINAL   Final    Studies/Results: Ct Head Wo Contrast  05/28/2012  *RADIOLOGY REPORT*  Clinical Data: Seizure  CT HEAD WITHOUT CONTRAST  Technique:  Contiguous axial images were obtained from the base of the skull through the vertex without contrast.  Comparison: 11/29/2011  Findings: Moderate, generalized atrophy.  Mild periventricular white matter hypodensity is unchanged and appears chronic. Negative for acute infarct.  Negative for hemorrhage or mass.  Chronic sinusitis with opacification of the right maxillary sinus, unchanged.  Calvarium intact.  IMPRESSION: Atrophy and mild chronic microvascular ischemic changes in the white matter.  No acute abnormality.  Chronic  sinusitis.   Original Report Authenticated By: Truett Perna, M.D.    Dg Chest Portable 1 View  05/28/2012  *RADIOLOGY REPORT*  Clinical Data: Seizures.  Diabetes.  Hypertension.  PORTABLE CHEST - 1 VIEW  Comparison: 07/02/2004  Findings: Upper normal heart size noted.  Old left-sided rib fractures are present along with evidence of cervical spondylosis. Mild aortic tortuosity is present.  The lungs appear clear.  IMPRESSION:  1.  Old left-sided rib fractures. 2.  No acute findings. 3.  Cervical spondylosis.   Original Report Authenticated By: Carron Curie, M.D.    Medications: I have reviewed the patient's current medications. Scheduled Meds:   . FLUoxetine  40 mg Oral Daily  . folic acid  1 mg Oral Daily  . heparin  5,000 Units Subcutaneous Q8H  . insulin aspart  0-9 Units Subcutaneous Q4H  . oxybutynin  5 mg Oral Daily  . pneumococcal 23 valent vaccine  0.5 mL Intramuscular Tomorrow-1000  . simvastatin  20 mg Oral QPM  . thiamine  100 mg Oral Daily  . DISCONTD: sodium chloride   Intravenous STAT  . DISCONTD: FLUoxetine  40 mg Oral Daily  . DISCONTD: folic acid  1 mg Intravenous Daily  . DISCONTD: thiamine  100 mg Intravenous Daily   Continuous Infusions:  PRN Meds:.ondansetron (ZOFRAN) IV, ondansetron Assessment/Plan: 66 year-old man with PMH significant for DM2, HLP, EhtOH abuse, who presented to ED via EMS for evaluation and treatment of witnessed seizure   1. Seizure: At his presentation it was unclear if this was a new onset seizure. The patient has history of alcoholism with last drink more than three days ago and this certainly could be secondary to alcohol withdrawal. His stepdaughter also reported head injury as recent as the day prior to his admission after a faulty window fall on the patient's head. His CT head without contrast, however, is negative for acute abnormalities, making this less likely seizure secondary to head trauma. His treatment goal will be focused on  prevention of recurrent seizure with alcohol cessation counseling and close monitoring for alcohol withdrawal. He reports today abstaining from EtOH for more than 10 days prior to his admission. Drinking history of more than 80oz of beer daily, with no history of seizure, or alcohol withdrawal. No recurrent seizure, he reports feeling well today.  -Transfer to MedSurg from [place] seizure precautions  -CIWA protocol  -thiamine/folate/MV  -Neurology consulted. Appreciate recommendations.   2. Hyponatremia: Now resolved, Na of 136 today.  At admission corrected Na 132.4, likely hypovolemic hyponatremia.  -Continue  IVF hydration, NS @75ml /h x 6 hr and follow BMP   3. Acute Renal Failure: baseline Cr 1.0. Cr was 1.49, on admission, likely pre-renal. FENa will not be helpful as patient has been on diuretics at home. Creatinine trending down to 1.32 today.  -CMP in AM -Pt with adequate PO intake. Continue IVF per above  4. Fever: Resolved. T max 100.5, unclear etiology but likely 2/2 seizure. Urinalysis is negative for leukocytes or nitrites. CXR is negative for any infiltrates. No neck stiffness at presentation, less likely meningitis, will reassess need for LP.  -Ordered urine culture and blood cultures X2.   5. DM II: diet control at home, last HbA1c in 01/2012 was 6.0. Glucose was 188. HbA1c on admission 5.8. Will give SSI-sensitive while in hospital   6. HTN: well controlled, will hold ACEi in setting of acute renal failure and hctz in setting of volume contraction.   7. Tachycardia: Resolved. Likely to agitation and stress/seizure on admission. No clear source of infection.  -Will transfer to Med-Surg  8. Alcohol Abuse: CIWA protocol, IV thiamine and folate. Alcohol cessation counseling .  9. HLP: well controlled, lipid panel in 2/213 showed LDL of 89. Will continue Zocor 20mg  qhs   10. Dispo. Consult to Education officer, museum for options.   DVT ppx: Heparin SQ TID   LOS: 1 day    Blain Pais 05/29/2012, 4:20 PM

## 2012-05-29 NOTE — Progress Notes (Signed)
Subjective: Patient with history of alcohol abuse, stopped drinking and had withdrawal seizures. Patient seen at the bedside, he is awake , alert and follows commands.  Intermittently getting agitated No seizures since admission  Objective: Current vital signs: BP 126/94  Pulse 74  Temp 98.2 F (36.8 C) (Oral)  Resp 19  Ht 6' (1.829 m)  Wt 79.1 kg (174 lb 6.1 oz)  BMI 23.65 kg/m2  SpO2 98% Vital signs in last 24 hours: Temp:  [98.2 F (36.8 C)-98.6 F (37 C)] 98.2 F (36.8 C) (08/31 0720) Pulse Rate:  [59-80] 74  (08/31 0720) Resp:  [13-21] 19  (08/31 0720) BP: (121-155)/(66-99) 126/94 mmHg (08/31 0720) SpO2:  [95 %-100 %] 98 % (08/31 0720) Weight:  [79 kg (174 lb 2.6 oz)-79.1 kg (174 lb 6.1 oz)] 79.1 kg (174 lb 6.1 oz) (08/31 0317)  Intake/Output from previous day: 08/30 0701 - 08/31 0700 In: -  Out: 400 [Urine:400] Intake/Output this shift: Total I/O In: -  Out: 300 [Urine:300] Nutritional status: Carb Control  Neurologic Exam: Patient is awake, alert and follows commands Memory poor Thought processes is poor  Cranial nerves no deficits Motor: able to move all the 4 extremities Sensory intact Gait:  Deferred  Lab Results: Results for orders placed during the hospital encounter of 05/28/12 (from the past 48 hour(s))  COMPREHENSIVE METABOLIC PANEL     Status: Abnormal   Collection Time   05/28/12  9:28 AM      Component Value Range Comment   Sodium 131 (*) 135 - 145 mEq/L    Potassium 4.8  3.5 - 5.1 mEq/L    Chloride 97  96 - 112 mEq/L    CO2 22  19 - 32 mEq/L    Glucose, Bld 188 (*) 70 - 99 mg/dL    BUN 13  6 - 23 mg/dL    Creatinine, Ser 1.49 (*) 0.50 - 1.35 mg/dL    Calcium 9.3  8.4 - 10.5 mg/dL    Total Protein 6.8  6.0 - 8.3 g/dL    Albumin 3.7  3.5 - 5.2 g/dL    AST 21  0 - 37 U/L    ALT 13  0 - 53 U/L    Alkaline Phosphatase 62  39 - 117 U/L    Total Bilirubin 0.4  0.3 - 1.2 mg/dL    GFR calc non Af Amer 46 (*) >90 mL/min    GFR calc Af Amer  54 (*) >90 mL/min   CBC WITH DIFFERENTIAL     Status: Abnormal   Collection Time   05/28/12  9:28 AM      Component Value Range Comment   WBC 10.2  4.0 - 10.5 K/uL    RBC 3.79 (*) 4.22 - 5.81 MIL/uL    Hemoglobin 12.6 (*) 13.0 - 17.0 g/dL    HCT 36.3 (*) 39.0 - 52.0 %    MCV 95.8  78.0 - 100.0 fL    MCH 33.2  26.0 - 34.0 pg    MCHC 34.7  30.0 - 36.0 g/dL    RDW 13.1  11.5 - 15.5 %    Platelets 156  150 - 400 K/uL    Neutrophils Relative 90 (*) 43 - 77 %    Neutro Abs 9.2 (*) 1.7 - 7.7 K/uL    Lymphocytes Relative 4 (*) 12 - 46 %    Lymphs Abs 0.4 (*) 0.7 - 4.0 K/uL    Monocytes Relative 5  3 - 12 %  Monocytes Absolute 0.5  0.1 - 1.0 K/uL    Eosinophils Relative 0  0 - 5 %    Eosinophils Absolute 0.0  0.0 - 0.7 K/uL    Basophils Relative 0  0 - 1 %    Basophils Absolute 0.0  0.0 - 0.1 K/uL   PROTIME-INR     Status: Normal   Collection Time   05/28/12  9:28 AM      Component Value Range Comment   Prothrombin Time 14.8  11.6 - 15.2 seconds    INR 1.14  0.00 - 1.49   APTT     Status: Normal   Collection Time   05/28/12  9:28 AM      Component Value Range Comment   aPTT 27  24 - 37 seconds   ETHANOL     Status: Normal   Collection Time   05/28/12  9:28 AM      Component Value Range Comment   Alcohol, Ethyl (B) <11  0 - 11 mg/dL   URINALYSIS, ROUTINE W REFLEX MICROSCOPIC     Status: Abnormal   Collection Time   05/28/12  9:44 AM      Component Value Range Comment   Color, Urine YELLOW  YELLOW    APPearance CLEAR  CLEAR    Specific Gravity, Urine 1.015  1.005 - 1.030    pH 5.0  5.0 - 8.0    Glucose, UA 500 (*) NEGATIVE mg/dL    Hgb urine dipstick NEGATIVE  NEGATIVE    Bilirubin Urine NEGATIVE  NEGATIVE    Ketones, ur NEGATIVE  NEGATIVE mg/dL    Protein, ur NEGATIVE  NEGATIVE mg/dL    Urobilinogen, UA 0.2  0.0 - 1.0 mg/dL    Nitrite NEGATIVE  NEGATIVE    Leukocytes, UA NEGATIVE  NEGATIVE MICROSCOPIC NOT DONE ON URINES WITH NEGATIVE PROTEIN, BLOOD, LEUKOCYTES, NITRITE, OR  GLUCOSE <1000 mg/dL.  URINE RAPID DRUG SCREEN (HOSP PERFORMED)     Status: Abnormal   Collection Time   05/28/12  9:44 AM      Component Value Range Comment   Opiates NONE DETECTED  NONE DETECTED    Cocaine NONE DETECTED  NONE DETECTED    Benzodiazepines POSITIVE (*) NONE DETECTED    Amphetamines NONE DETECTED  NONE DETECTED    Tetrahydrocannabinol NONE DETECTED  NONE DETECTED    Barbiturates NONE DETECTED  NONE DETECTED   GLUCOSE, CAPILLARY     Status: Normal   Collection Time   05/28/12  8:03 PM      Component Value Range Comment   Glucose-Capillary 89  70 - 99 mg/dL   CBC     Status: Abnormal   Collection Time   05/28/12  8:27 PM      Component Value Range Comment   WBC 6.4  4.0 - 10.5 K/uL    RBC 3.83 (*) 4.22 - 5.81 MIL/uL    Hemoglobin 12.6 (*) 13.0 - 17.0 g/dL    HCT 37.0 (*) 39.0 - 52.0 %    MCV 96.6  78.0 - 100.0 fL    MCH 32.9  26.0 - 34.0 pg    MCHC 34.1  30.0 - 36.0 g/dL    RDW 13.4  11.5 - 15.5 %    Platelets 168  150 - 400 K/uL   CREATININE, SERUM     Status: Abnormal   Collection Time   05/28/12  8:27 PM      Component Value Range Comment   Creatinine, Ser 1.27  0.50 - 1.35 mg/dL    GFR calc non Af Amer 56 (*) >90 mL/min    GFR calc Af Amer 65 (*) >90 mL/min   HEMOGLOBIN A1C     Status: Abnormal   Collection Time   05/28/12  8:27 PM      Component Value Range Comment   Hemoglobin A1C 5.8 (*) <5.7 %    Mean Plasma Glucose 120 (*) <117 mg/dL   SODIUM, URINE, RANDOM     Status: Normal   Collection Time   05/28/12  8:44 PM      Component Value Range Comment   Sodium, Ur 136     OSMOLALITY, URINE     Status: Normal   Collection Time   05/28/12  8:44 PM      Component Value Range Comment   Osmolality, Ur 395  390 - 1090 mOsm/kg   GLUCOSE, CAPILLARY     Status: Abnormal   Collection Time   05/29/12 12:05 AM      Component Value Range Comment   Glucose-Capillary 149 (*) 70 - 99 mg/dL   GLUCOSE, CAPILLARY     Status: Abnormal   Collection Time   05/29/12  3:16  AM      Component Value Range Comment   Glucose-Capillary 117 (*) 70 - 99 mg/dL   BASIC METABOLIC PANEL     Status: Abnormal   Collection Time   05/29/12  3:45 AM      Component Value Range Comment   Sodium 136  135 - 145 mEq/L    Potassium 4.2  3.5 - 5.1 mEq/L    Chloride 102  96 - 112 mEq/L    CO2 23  19 - 32 mEq/L    Glucose, Bld 108 (*) 70 - 99 mg/dL    BUN 12  6 - 23 mg/dL    Creatinine, Ser 1.32  0.50 - 1.35 mg/dL    Calcium 9.6  8.4 - 10.5 mg/dL    GFR calc non Af Amer 54 (*) >90 mL/min    GFR calc Af Amer 62 (*) >90 mL/min   CBC     Status: Abnormal   Collection Time   05/29/12  3:45 AM      Component Value Range Comment   WBC 5.8  4.0 - 10.5 K/uL    RBC 3.89 (*) 4.22 - 5.81 MIL/uL    Hemoglobin 12.8 (*) 13.0 - 17.0 g/dL    HCT 37.7 (*) 39.0 - 52.0 %    MCV 96.9  78.0 - 100.0 fL    MCH 32.9  26.0 - 34.0 pg    MCHC 34.0  30.0 - 36.0 g/dL    RDW 13.5  11.5 - 15.5 %    Platelets 181  150 - 400 K/uL   MAGNESIUM     Status: Normal   Collection Time   05/29/12  3:45 AM      Component Value Range Comment   Magnesium 1.6  1.5 - 2.5 mg/dL   PHOSPHORUS     Status: Normal   Collection Time   05/29/12  3:45 AM      Component Value Range Comment   Phosphorus 3.1  2.3 - 4.6 mg/dL   GLUCOSE, CAPILLARY     Status: Abnormal   Collection Time   05/29/12  7:22 AM      Component Value Range Comment   Glucose-Capillary 122 (*) 70 - 99 mg/dL    Comment 1 Documented in Chart  Comment 2 Notify RN       No results found for this or any previous visit (from the past 240 hour(s)).  Lipid Panel No results found for this basename: CHOL,TRIG,HDL,CHOLHDL,VLDL,LDLCALC in the last 72 hours  Studies/Results: Ct Head Wo Contrast  05/28/2012  *RADIOLOGY REPORT*  Clinical Data: Seizure  CT HEAD WITHOUT CONTRAST  Technique:  Contiguous axial images were obtained from the base of the skull through the vertex without contrast.  Comparison: 11/29/2011  Findings: Moderate, generalized atrophy.   Mild periventricular white matter hypodensity is unchanged and appears chronic. Negative for acute infarct.  Negative for hemorrhage or mass.  Chronic sinusitis with opacification of the right maxillary sinus, unchanged.  Calvarium intact.  IMPRESSION: Atrophy and mild chronic microvascular ischemic changes in the white matter.  No acute abnormality.  Chronic sinusitis.   Original Report Authenticated By: Truett Perna, M.D.    Dg Chest Portable 1 View  05/28/2012  *RADIOLOGY REPORT*  Clinical Data: Seizures.  Diabetes.  Hypertension.  PORTABLE CHEST - 1 VIEW  Comparison: 07/02/2004  Findings: Upper normal heart size noted.  Old left-sided rib fractures are present along with evidence of cervical spondylosis. Mild aortic tortuosity is present.  The lungs appear clear.  IMPRESSION:  1.  Old left-sided rib fractures. 2.  No acute findings. 3.  Cervical spondylosis.   Original Report Authenticated By: Carron Curie, M.D.     Medications:  Scheduled:   . FLUoxetine  40 mg Oral Daily  . folic acid  1 mg Intravenous Daily  . heparin  5,000 Units Subcutaneous Q8H  . insulin aspart  0-9 Units Subcutaneous Q4H  . LORazepam  1 mg Intravenous Once  . oxybutynin  5 mg Oral Daily  . pneumococcal 23 valent vaccine  0.5 mL Intramuscular Tomorrow-1000  . simvastatin  20 mg Oral QPM  . thiamine  100 mg Intravenous Daily  . DISCONTD: sodium chloride   Intravenous STAT  . DISCONTD: FLUoxetine  40 mg Oral Daily      Assessment and Recommendations Patient with possibly alcohol withdrawal seizure. NO recurrence of the episode Is on alcohol withdrawal protocol Recommend: 1)  1mg  Ativan IV for seizure 2) Fall precautions   Leshonda Galambos V-P Donnie Mesa., MD., Ph.D.,MS 05/29/2012 11:23 AM

## 2012-05-29 NOTE — Progress Notes (Signed)
Pt report called to [facility]. Pt to be transferred to [place], room 1. Pt's wife called with new room assignment.  Pt without complaints.

## 2012-05-30 DIAGNOSIS — E785 Hyperlipidemia, unspecified: Secondary | ICD-10-CM | POA: Diagnosis not present

## 2012-05-30 LAB — COMPREHENSIVE METABOLIC PANEL
Albumin: 3.5 g/dL (ref 3.5–5.2)
Alkaline Phosphatase: 55 U/L (ref 39–117)
BUN: 21 mg/dL (ref 6–23)
Chloride: 95 mEq/L — ABNORMAL LOW (ref 96–112)
Creatinine, Ser: 1.63 mg/dL — ABNORMAL HIGH (ref 0.50–1.35)
GFR calc Af Amer: 48 mL/min — ABNORMAL LOW (ref >90)
Glucose, Bld: 123 mg/dL — ABNORMAL HIGH (ref 70–99)
Potassium: 3.8 mEq/L (ref 3.5–5.1)
Total Bilirubin: 0.3 mg/dL (ref 0.3–1.2)
Total Protein: 6.3 g/dL (ref 6.0–8.3)

## 2012-05-30 LAB — CBC WITH DIFFERENTIAL/PLATELET
Basophils Absolute: 0 10*3/uL (ref 0.0–0.1)
Lymphocytes Relative: 25 % (ref 12–46)
Lymphs Abs: 1.3 10*3/uL (ref 0.7–4.0)
Neutro Abs: 3.3 10*3/uL (ref 1.7–7.7)
Platelets: 154 10*3/uL (ref 150–400)
RBC: 3.5 MIL/uL — ABNORMAL LOW (ref 4.22–5.81)
RDW: 13.3 % (ref 11.5–15.5)
WBC: 5.3 10*3/uL (ref 4.0–10.5)

## 2012-05-30 LAB — BASIC METABOLIC PANEL
BUN: 18 mg/dL (ref 6–23)
CO2: 24 mEq/L (ref 19–32)
Chloride: 100 mEq/L (ref 96–112)
Creatinine, Ser: 1.31 mg/dL (ref 0.50–1.35)
Glucose, Bld: 174 mg/dL — ABNORMAL HIGH (ref 70–99)

## 2012-05-30 LAB — GLUCOSE, CAPILLARY
Glucose-Capillary: 195 mg/dL — ABNORMAL HIGH (ref 70–99)
Glucose-Capillary: 119 mg/dL — ABNORMAL HIGH (ref 70–99)

## 2012-05-30 LAB — MAGNESIUM: Magnesium: 1.5 mg/dL (ref 1.5–2.5)

## 2012-05-30 MED ORDER — LORAZEPAM 2 MG/ML IJ SOLN: 1.0000 mg | Freq: Four times a day (QID) | INTRAMUSCULAR | Status: DC | PRN

## 2012-05-30 MED ORDER — LORAZEPAM 1 MG PO TABS: 1.0000 mg | ORAL_TABLET | Freq: Four times a day (QID) | ORAL | Status: DC | PRN

## 2012-05-30 NOTE — Progress Notes (Signed)
Subjective: Patient seen at the bedside, ETOH withdrawal seizure.  Patient is stable on benzo.  No more seizure activity Patient is awake, alert and follows commands. No complications overnight, Denies, headaches, no nausea, no vomiting, no diplopia, and  No motor or sensory deficits.  Objective: Current vital signs: BP 111/71  Pulse 74  Temp 98.1 F (36.7 C) (Oral)  Resp 20  Ht 6' (1.829 m)  Wt 84 kg (185 lb 3 oz)  BMI 25.12 kg/m2  SpO2 98% Vital signs in last 24 hours: Temp:  [98.1 F (36.7 C)-99.4 F (37.4 C)] 98.1 F (36.7 C) (09/01 0423) Pulse Rate:  [69-103] 74  (09/01 0423) Resp:  [17-22] 20  (09/01 0423) BP: (93-125)/(67-85) 111/71 mmHg (09/01 0423) SpO2:  [72 %-100 %] 98 % (09/01 0423) Weight:  [84 kg (185 lb 3 oz)] 84 kg (185 lb 3 oz) (09/01 0423)  Intake/Output from previous day: 08/31 0701 - 09/01 0700 In: 2760 [P.O.:1760; I.V.:1000] Out: 1025 [Urine:1025] Intake/Output this shift:   Nutritional status: Carb Control  Neurologic Exam:  Patient is awake, alert and follows commands Speech: Clear without the evidence of aphasia Motor : No new deficits Sensory: 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   Lab Results: Results for orders placed during the hospital encounter of 05/28/12 (from the past 48 hour(s))  COMPREHENSIVE METABOLIC PANEL     Status: Abnormal   Collection Time   05/28/12  9:28 AM      Component Value Range Comment   Sodium 131 (*) 135 - 145 mEq/L    Potassium 4.8  3.5 - 5.1 mEq/L    Chloride 97  96 - 112 mEq/L    CO2 22  19 - 32 mEq/L    Glucose, Bld 188 (*) 70 - 99 mg/dL    BUN 13  6 - 23 mg/dL    Creatinine, Ser 1.49 (*) 0.50 - 1.35 mg/dL    Calcium 9.3  8.4 - 10.5 mg/dL    Total Protein 6.8  6.0 - 8.3 g/dL    Albumin 3.7  3.5 - 5.2 g/dL    AST 21  0 - 37 U/L    ALT 13  0 - 53 U/L    Alkaline Phosphatase 62  39 - 117 U/L    Total Bilirubin 0.4  0.3 - 1.2 mg/dL    GFR calc non Af Amer 46 (*) >90 mL/min    GFR calc Af Amer 54 (*) >90 mL/min   CBC WITH DIFFERENTIAL     Status: Abnormal   Collection Time   05/28/12  9:28 AM      Component Value Range Comment   WBC 10.2  4.0 - 10.5 K/uL    RBC 3.79 (*) 4.22 - 5.81 MIL/uL    Hemoglobin 12.6 (*) 13.0 - 17.0 g/dL    HCT 36.3 (*) 39.0 - 52.0 %    MCV 95.8  78.0 - 100.0 fL    MCH 33.2  26.0 - 34.0 pg    MCHC 34.7  30.0 - 36.0 g/dL    RDW 13.1  11.5 - 15.5 %    Platelets 156  150 - 400 K/uL    Neutrophils Relative 90 (*) 43 - 77 %    Neutro Abs 9.2 (*) 1.7 - 7.7 K/uL    Lymphocytes Relative 4 (*) 12 - 46 %    Lymphs Abs 0.4 (*) 0.7 - 4.0 K/uL    Monocytes Relative 5  3 - 12 %  Monocytes Absolute 0.5  0.1 - 1.0 K/uL    Eosinophils Relative 0  0 - 5 %    Eosinophils Absolute 0.0  0.0 - 0.7 K/uL    Basophils Relative 0  0 - 1 %    Basophils Absolute 0.0  0.0 - 0.1 K/uL   PROTIME-INR     Status: Normal   Collection Time   05/28/12  9:28 AM      Component Value Range Comment   Prothrombin Time 14.8  11.6 - 15.2 seconds    INR 1.14  0.00 - 1.49   APTT     Status: Normal   Collection Time   05/28/12  9:28 AM      Component Value Range Comment   aPTT 27  24 - 37 seconds   ETHANOL     Status: Normal   Collection Time   05/28/12  9:28 AM      Component Value Range Comment   Alcohol, Ethyl (B) <11  0 - 11 mg/dL   URINALYSIS, ROUTINE W REFLEX MICROSCOPIC     Status: Abnormal   Collection Time   05/28/12  9:44 AM      Component Value Range Comment   Color, Urine YELLOW  YELLOW    APPearance CLEAR  CLEAR    Specific Gravity, Urine 1.015  1.005 - 1.030    pH 5.0  5.0 - 8.0    Glucose, UA 500 (*) NEGATIVE mg/dL    Hgb urine dipstick NEGATIVE  NEGATIVE    Bilirubin Urine NEGATIVE  NEGATIVE    Ketones, ur NEGATIVE  NEGATIVE mg/dL    Protein, ur NEGATIVE  NEGATIVE mg/dL    Urobilinogen, UA 0.2  0.0 - 1.0 mg/dL    Nitrite NEGATIVE  NEGATIVE    Leukocytes, UA NEGATIVE  NEGATIVE MICROSCOPIC NOT DONE ON URINES WITH NEGATIVE PROTEIN, BLOOD,  LEUKOCYTES, NITRITE, OR GLUCOSE <1000 mg/dL.  URINE CULTURE     Status: Normal   Collection Time   05/28/12  9:44 AM      Component Value Range Comment   Specimen Description URINE, CATHETERIZED      Special Requests NONE      Culture  Setup Time 05/28/2012 11:18      Colony Count NO GROWTH      Culture NO GROWTH      Report Status 05/29/2012 FINAL     URINE RAPID DRUG SCREEN (HOSP PERFORMED)     Status: Abnormal   Collection Time   05/28/12  9:44 AM      Component Value Range Comment   Opiates NONE DETECTED  NONE DETECTED    Cocaine NONE DETECTED  NONE DETECTED    Benzodiazepines POSITIVE (*) NONE DETECTED    Amphetamines NONE DETECTED  NONE DETECTED    Tetrahydrocannabinol NONE DETECTED  NONE DETECTED    Barbiturates NONE DETECTED  NONE DETECTED   GLUCOSE, CAPILLARY     Status: Normal   Collection Time   05/28/12  8:03 PM      Component Value Range Comment   Glucose-Capillary 89  70 - 99 mg/dL   CBC     Status: Abnormal   Collection Time   05/28/12  8:27 PM      Component Value Range Comment   WBC 6.4  4.0 - 10.5 K/uL    RBC 3.83 (*) 4.22 - 5.81 MIL/uL    Hemoglobin 12.6 (*) 13.0 - 17.0 g/dL    HCT 37.0 (*) 39.0 - 52.0 %  MCV 96.6  78.0 - 100.0 fL    MCH 32.9  26.0 - 34.0 pg    MCHC 34.1  30.0 - 36.0 g/dL    RDW 13.4  11.5 - 15.5 %    Platelets 168  150 - 400 K/uL   CREATININE, SERUM     Status: Abnormal   Collection Time   05/28/12  8:27 PM      Component Value Range Comment   Creatinine, Ser 1.27  0.50 - 1.35 mg/dL    GFR calc non Af Amer 56 (*) >90 mL/min    GFR calc Af Amer 65 (*) >90 mL/min   HEMOGLOBIN A1C     Status: Abnormal   Collection Time   05/28/12  8:27 PM      Component Value Range Comment   Hemoglobin A1C 5.8 (*) <5.7 %    Mean Plasma Glucose 120 (*) <117 mg/dL   SODIUM, URINE, RANDOM     Status: Normal   Collection Time   05/28/12  8:44 PM      Component Value Range Comment   Sodium, Ur 136     OSMOLALITY, URINE     Status: Normal   Collection  Time   05/28/12  8:44 PM      Component Value Range Comment   Osmolality, Ur 395  390 - 1090 mOsm/kg   GLUCOSE, CAPILLARY     Status: Abnormal   Collection Time   05/29/12 12:05 AM      Component Value Range Comment   Glucose-Capillary 149 (*) 70 - 99 mg/dL   GLUCOSE, CAPILLARY     Status: Abnormal   Collection Time   05/29/12  3:16 AM      Component Value Range Comment   Glucose-Capillary 117 (*) 70 - 99 mg/dL   BASIC METABOLIC PANEL     Status: Abnormal   Collection Time   05/29/12  3:45 AM      Component Value Range Comment   Sodium 136  135 - 145 mEq/L    Potassium 4.2  3.5 - 5.1 mEq/L    Chloride 102  96 - 112 mEq/L    CO2 23  19 - 32 mEq/L    Glucose, Bld 108 (*) 70 - 99 mg/dL    BUN 12  6 - 23 mg/dL    Creatinine, Ser 1.32  0.50 - 1.35 mg/dL    Calcium 9.6  8.4 - 10.5 mg/dL    GFR calc non Af Amer 54 (*) >90 mL/min    GFR calc Af Amer 62 (*) >90 mL/min   CBC     Status: Abnormal   Collection Time   05/29/12  3:45 AM      Component Value Range Comment   WBC 5.8  4.0 - 10.5 K/uL    RBC 3.89 (*) 4.22 - 5.81 MIL/uL    Hemoglobin 12.8 (*) 13.0 - 17.0 g/dL    HCT 37.7 (*) 39.0 - 52.0 %    MCV 96.9  78.0 - 100.0 fL    MCH 32.9  26.0 - 34.0 pg    MCHC 34.0  30.0 - 36.0 g/dL    RDW 13.5  11.5 - 15.5 %    Platelets 181  150 - 400 K/uL   MAGNESIUM     Status: Normal   Collection Time   05/29/12  3:45 AM      Component Value Range Comment   Magnesium 1.6  1.5 - 2.5 mg/dL   PHOSPHORUS  Status: Normal   Collection Time   05/29/12  3:45 AM      Component Value Range Comment   Phosphorus 3.1  2.3 - 4.6 mg/dL   GLUCOSE, CAPILLARY     Status: Abnormal   Collection Time   05/29/12  7:22 AM      Component Value Range Comment   Glucose-Capillary 122 (*) 70 - 99 mg/dL    Comment 1 Documented in Chart      Comment 2 Notify RN     GLUCOSE, CAPILLARY     Status: Abnormal   Collection Time   05/29/12 11:24 AM      Component Value Range Comment   Glucose-Capillary 146 (*) 70 - 99  mg/dL   VITAMIN B12     Status: Normal   Collection Time   05/29/12 12:48 PM      Component Value Range Comment   Vitamin B-12 645  211 - 911 pg/mL   GLUCOSE, CAPILLARY     Status: Abnormal   Collection Time   05/29/12  4:23 PM      Component Value Range Comment   Glucose-Capillary 192 (*) 70 - 99 mg/dL    Comment 1 Documented in Chart      Comment 2 Notify RN     GLUCOSE, CAPILLARY     Status: Abnormal   Collection Time   05/29/12  8:33 PM      Component Value Range Comment   Glucose-Capillary 186 (*) 70 - 99 mg/dL   GLUCOSE, CAPILLARY     Status: Abnormal   Collection Time   05/30/12 12:24 AM      Component Value Range Comment   Glucose-Capillary 128 (*) 70 - 99 mg/dL   GLUCOSE, CAPILLARY     Status: Abnormal   Collection Time   05/30/12  4:11 AM      Component Value Range Comment   Glucose-Capillary 138 (*) 70 - 99 mg/dL   COMPREHENSIVE METABOLIC PANEL     Status: Abnormal   Collection Time   05/30/12  5:05 AM      Component Value Range Comment   Sodium 128 (*) 135 - 145 mEq/L DELTA CHECK NOTED   Potassium 3.8  3.5 - 5.1 mEq/L    Chloride 95 (*) 96 - 112 mEq/L    CO2 23  19 - 32 mEq/L    Glucose, Bld 123 (*) 70 - 99 mg/dL    BUN 21  6 - 23 mg/dL    Creatinine, Ser 1.63 (*) 0.50 - 1.35 mg/dL    Calcium 9.4  8.4 - 10.5 mg/dL    Total Protein 6.3  6.0 - 8.3 g/dL    Albumin 3.5  3.5 - 5.2 g/dL    AST 16  0 - 37 U/L    ALT 12  0 - 53 U/L    Alkaline Phosphatase 55  39 - 117 U/L    Total Bilirubin 0.3  0.3 - 1.2 mg/dL    GFR calc non Af Amer 42 (*) >90 mL/min    GFR calc Af Amer 48 (*) >90 mL/min   CBC WITH DIFFERENTIAL     Status: Abnormal   Collection Time   05/30/12  5:05 AM      Component Value Range Comment   WBC 5.3  4.0 - 10.5 K/uL    RBC 3.50 (*) 4.22 - 5.81 MIL/uL    Hemoglobin 11.7 (*) 13.0 - 17.0 g/dL    HCT 33.7 (*) 39.0 - 52.0 %  MCV 96.3  78.0 - 100.0 fL    MCH 33.4  26.0 - 34.0 pg    MCHC 34.7  30.0 - 36.0 g/dL    RDW 13.3  11.5 - 15.5 %    Platelets 154   150 - 400 K/uL    Neutrophils Relative 61  43 - 77 %    Neutro Abs 3.3  1.7 - 7.7 K/uL    Lymphocytes Relative 25  12 - 46 %    Lymphs Abs 1.3  0.7 - 4.0 K/uL    Monocytes Relative 12  3 - 12 %    Monocytes Absolute 0.6  0.1 - 1.0 K/uL    Eosinophils Relative 2  0 - 5 %    Eosinophils Absolute 0.1  0.0 - 0.7 K/uL    Basophils Relative 0  0 - 1 %    Basophils Absolute 0.0  0.0 - 0.1 K/uL   MAGNESIUM     Status: Normal   Collection Time   05/30/12  5:05 AM      Component Value Range Comment   Magnesium 1.5  1.5 - 2.5 mg/dL   GLUCOSE, CAPILLARY     Status: Abnormal   Collection Time   05/30/12  8:56 AM      Component Value Range Comment   Glucose-Capillary 195 (*) 70 - 99 mg/dL     Recent Results (from the past 240 hour(s))  URINE CULTURE     Status: Normal   Collection Time   05/28/12  9:44 AM      Component Value Range Status Comment   Specimen Description URINE, CATHETERIZED   Final    Special Requests NONE   Final    Culture  Setup Time 05/28/2012 11:18   Final    Colony Count NO GROWTH   Final    Culture NO GROWTH   Final    Report Status 05/29/2012 FINAL   Final     Lipid Panel No results found for this basename: CHOL,TRIG,HDL,CHOLHDL,VLDL,LDLCALC in the last 72 hours  Studies/Results: Ct Head Wo Contrast  05/28/2012  *RADIOLOGY REPORT*  Clinical Data: Seizure  CT HEAD WITHOUT CONTRAST  Technique:  Contiguous axial images were obtained from the base of the skull through the vertex without contrast.  Comparison: 11/29/2011  Findings: Moderate, generalized atrophy.  Mild periventricular white matter hypodensity is unchanged and appears chronic. Negative for acute infarct.  Negative for hemorrhage or mass.  Chronic sinusitis with opacification of the right maxillary sinus, unchanged.  Calvarium intact.  IMPRESSION: Atrophy and mild chronic microvascular ischemic changes in the white matter.  No acute abnormality.  Chronic sinusitis.   Original Report Authenticated By: Truett Perna, M.D.    Mr Brain Wo Contrast  05/29/2012  *RADIOLOGY REPORT*  Clinical Data: New onset seizure  MRI HEAD WITHOUT CONTRAST  Technique:  Multiplanar, multiecho pulse sequences of the brain and surrounding structures were obtained according to standard protocol without intravenous contrast.  Comparison: CT head without contrast 05/28/2012.  Findings: No acute infarct, hemorrhage, mass lesion is present. Moderate generalized atrophy is present.  Confluent periventricular and scattered subcortical T2 and FLAIR hyperintensities are greater than expected for age.  The ventricles are proportionate to the degree of atrophy.  Flow is present in the major intracranial arteries.  No significant extra-axial fluid collection is present.  Chronic right maxillary sinus disease is present.  Anterior ethmoid air cells are obstructed on the right.  Moderate right frontal sinus disease is present.  There is fluid in the mastoid air cells bilaterally.  No obstructing nasopharyngeal lesion is evident.  IMPRESSION:  1.  No acute intracranial abnormality. 2.  Age advanced atrophy and extensive white matter disease.  This likely reflects the sequelae of chronic microvascular ischemia in this patient with diabetes, hypertension, and hyperlipidemia.   Original Report Authenticated By: Resa Miner. MATTERN, M.D.    Dg Chest Portable 1 View  05/28/2012  *RADIOLOGY REPORT*  Clinical Data: Seizures.  Diabetes.  Hypertension.  PORTABLE CHEST - 1 VIEW  Comparison: 07/02/2004  Findings: Upper normal heart size noted.  Old left-sided rib fractures are present along with evidence of cervical spondylosis. Mild aortic tortuosity is present.  The lungs appear clear.  IMPRESSION:  1.  Old left-sided rib fractures. 2.  No acute findings. 3.  Cervical spondylosis.   Original Report Authenticated By: Carron Curie, M.D.     Medications:  Scheduled:   . FLUoxetine  40 mg Oral Daily  . folic acid  1 mg Oral Daily  . heparin  5,000  Units Subcutaneous Q8H  . insulin aspart  0-9 Units Subcutaneous Q4H  . oxybutynin  5 mg Oral Daily  . pneumococcal 23 valent vaccine  0.5 mL Intramuscular Tomorrow-1000  . simvastatin  20 mg Oral QPM  . thiamine  100 mg Oral Daily  . DISCONTD: folic acid  1 mg Intravenous Daily  . DISCONTD: thiamine  100 mg Intravenous Daily    Assessment/Plan:  Patient with alcohol withdrawal Seizures, no AED. If needed please use Ativan. But patient does not have to be on scheduled Ativan  Reviewed MRI of the brain, no acute pathology No further Neurology management   Will sign off  Jomari Bartnik V-P Donnie Mesa., MD., Ph.D.,MS 05/30/2012 9:26 AM

## 2012-05-30 NOTE — Progress Notes (Addendum)
Subjective: No acute events including seizures since admission, pt without tremors, states that he is ready to go home   Objective: Vital signs in last 24 hours: Filed Vitals:   05/29/12 1550 05/29/12 1855 05/29/12 2319 05/30/12 0423  BP:  125/68 120/85 111/71  Pulse:  82 69 74  Temp: 98.9 F (37.2 C) 98.3 F (36.8 C) 99.2 F (37.3 C) 98.1 F (36.7 C)  TempSrc: Oral Oral Oral Oral  Resp:  18 20 20   Height:      Weight:    185 lb 3 oz (84 kg)  SpO2:  100% 100% 98%   Weight change: 11 lb 0.4 oz (5 kg)  Intake/Output Summary (Last 24 hours) at 05/30/12 1032 Last data filed at 05/30/12 0600  Gross per 24 hour  Intake   2400 ml  Output    725 ml  Net   1675 ml    General: lying in bed listening to tv, conversant, in NAD. HEENT: PERRL, EOMI, no scleral icterus. Scattered ~1cm superficial cuts on his scalp with dry scabs.  Cardiac: RRR, no rubs, murmurs or gallops Pulm: clear to auscultation bilaterally, no wheezes, rales, or rhonchi Abd: soft, nontender, BS present Ext: warm and well perfused, no pedal edema Neuro: alert and oriented X3, cranial nerves II-XII grossly intact, strength and sensation to light touch equal in bilateral upper and lower extremities, no tremor noted, negative Romberg   Lab Results: Basic Metabolic Panel:  Lab [id number] 0505 05/29/12 0345  NA 128* 136  K 3.8 4.2  CL 95* 102  CO2 23 23  GLUCOSE 123* 108*  BUN 21 12  CREATININE 1.63* 1.32  CALCIUM 9.4 9.6  MG 1.5 1.6  PHOS -- 3.1   Liver Function Tests:  Lab 05/30/12 0505 05/28/12 0928  AST 16 21  ALT 12 13  ALKPHOS 55 62  BILITOT 0.3 0.4  PROT 6.3 6.8  ALBUMIN 3.5 3.7   CBC:  Lab 05/30/12 0505 05/29/12 0345 05/28/12 0928  WBC 5.3 5.8 --  NEUTROABS 3.3 -- 9.2*  HGB 11.7* 12.8* --  HCT 33.7* 37.7* --  MCV 96.3 96.9 --  PLT 154 181 --   CBG:  Lab 05/30/12 0856 05/30/12 0411 05/30/12 0024 05/29/12 2033 05/29/12 1623 05/29/12 1124  GLUCAP 195* 138* 128* 186* 192* 146*    Hemoglobin A1C:  Lab 05/28/12 2027  HGBA1C 5.8*   Coagulation:  Lab 05/28/12 0928  LABPROT 14.8  INR 1.14   Urine Drug Screen: Drugs of Abuse     Component Value Date/Time   LABOPIA NONE DETECTED 05/28/2012 0944   COCAINSCRNUR NONE DETECTED 05/28/2012 0944   LABBENZ POSITIVE* 05/28/2012 0944   AMPHETMU NONE DETECTED 05/28/2012 0944   THCU NONE DETECTED 05/28/2012 0944   LABBARB NONE DETECTED 05/28/2012 0944    Alcohol Level:  Lab 05/28/12 0928  ETH <11   Urinalysis:  Lab 05/28/12 0944  COLORURINE YELLOW  LABSPEC 1.015  PHURINE 5.0  GLUCOSEU 500*  HGBUR NEGATIVE  BILIRUBINUR NEGATIVE  KETONESUR NEGATIVE  PROTEINUR NEGATIVE  UROBILINOGEN 0.2  NITRITE NEGATIVE  LEUKOCYTESUR NEGATIVE    Micro Results: Recent Results (from the past 240 hour(s))  URINE CULTURE     Status: Normal   Collection Time   05/28/12  9:44 AM      Component Value Range Status Comment   Specimen Description URINE, CATHETERIZED   Final    Special Requests NONE   Final    Culture  Setup Time 05/28/2012 11:18   Final  Colony Count NO GROWTH   Final    Culture NO GROWTH   Final    Report Status 05/29/2012 FINAL   Final    Studies/Results: Mr Brain Wo Contrast  05/29/2012  *RADIOLOGY REPORT*  Clinical Data: New onset seizure  MRI HEAD WITHOUT CONTRAST  Technique:  Multiplanar, multiecho pulse sequences of the brain and surrounding structures were obtained according to standard protocol without intravenous contrast.  Comparison: CT head without contrast 05/28/2012.  Findings: No acute infarct, hemorrhage, mass lesion is present. Moderate generalized atrophy is present.  Confluent periventricular and scattered subcortical T2 and FLAIR hyperintensities are greater than expected for age.  The ventricles are proportionate to the degree of atrophy.  Flow is present in the major intracranial arteries.  No significant extra-axial fluid collection is present.  Chronic right maxillary sinus disease is  present.  Anterior ethmoid air cells are obstructed on the right.  Moderate right frontal sinus disease is present.  There is fluid in the mastoid air cells bilaterally.  No obstructing nasopharyngeal lesion is evident.  IMPRESSION:  1.  No acute intracranial abnormality. 2.  Age advanced atrophy and extensive white matter disease.  This likely reflects the sequelae of chronic microvascular ischemia in this patient with diabetes, hypertension, and hyperlipidemia.   Original Report Authenticated By: Resa Miner. MATTERN, M.D.    Medications: I have reviewed the patient's current medications. Scheduled Meds:    . FLUoxetine  40 mg Oral Daily  . folic acid  1 mg Oral Daily  . heparin  5,000 Units Subcutaneous Q8H  . insulin aspart  0-9 Units Subcutaneous Q4H  . oxybutynin  5 mg Oral Daily  . pneumococcal 23 valent vaccine  0.5 mL Intramuscular Tomorrow-1000  . simvastatin  20 mg Oral QPM  . thiamine  100 mg Oral Daily  . DISCONTD: folic acid  1 mg Intravenous Daily  . DISCONTD: thiamine  100 mg Intravenous Daily   Continuous Infusions:    . sodium chloride 100 mL/hr at 05/29/12 2119   PRN Meds:.ondansetron (ZOFRAN) IV, ondansetron Assessment/Plan: 66 year-old man with PMH significant for DM2, HLP, EtOH abuse, who presented to ED via EMS for evaluation and treatment of witnessed seizure   1. Seizure: At his presentation it was unclear if this was a new onset seizure.His CT head without contrast, however, is negative for acute abnormalities, making this less likely seizure secondary to head trauma. His treatment goal will be focused on prevention of recurrent seizure with alcohol cessation counseling and close monitoring for alcohol withdrawal.   No recurrent seizure, he reports feeling well today and wants to go home. Has not required benzos but formal CIWA doesn't appear to have been noted -Continue seizure precautions  -re-order CIWA protocol  -cont thiamine/folate/MV  -Neurology signed  off  2. Hyponatremia: intermittent, had resolved and thought likely secondary to hypovolemic hyponatremia, NA now 128 since starting full diet. Patient fluid intake is very frequent per nursing. -Continue  IVF hydration, NS @150ml /h x 6 hr and follow BMP  -fluid restrict to 1000 ml/day for now -will monitor i/O   Lab 05/30/12 0505 05/29/12 0345 05/28/12 0928  NA 128* 136 131*    3. Acute Renal Failure: baseline Cr 1.0, will continue to follow -Continue to follow creatinine -Pt with adequate PO intake. Increase IVF to 150cc/h   Lab 05/30/12 0505 05/29/12 0345 05/28/12 2027 05/28/12 0928  CREATININE 1.63* 1.32 1.27 1.49*    4. Fever: Resolved.   5. DM II: diet control  at home, HbA1c on admission 5.8.  -continue SSI-sensitive while in hospital   CBG (last 3)   Basename 05/30/12 0856 05/30/12 0411 05/30/12 0024  GLUCAP 195* 138* 128*    6. HTN: well controlled, will hold ACEi in setting of acute renal failure and hctz in setting of volume contraction.   7. Tachycardia: Resolved for most part. Likely to agitation and stress/seizure on admission.   8. Alcohol Abuse: CIWA protocol, cont thiamine and folate. Alcohol cessation counseling .  9. HLP: well controlled, lipid panel in 2/213 showed LDL of 89. Will continue Zocor 20mg  qhs   10. Dispo. Consult to Education officer, museum for alcohol cessation resources and possibility of assisted living options.  DVT ppx: Heparin SQ TID   LOS: 2 days   William [name] 05/30/2012, 10:32 AM

## 2012-05-31 ENCOUNTER — Inpatient Hospital Stay (HOSPITAL_COMMUNITY): Payer: Medicare Other

## 2012-05-31 DIAGNOSIS — E785 Hyperlipidemia, unspecified: Secondary | ICD-10-CM | POA: Diagnosis not present

## 2012-05-31 LAB — GLUCOSE, CAPILLARY
Glucose-Capillary: 127 mg/dL — ABNORMAL HIGH (ref 70–99)
Glucose-Capillary: 123 mg/dL — ABNORMAL HIGH (ref 70–99)

## 2012-05-31 NOTE — Progress Notes (Signed)
Internal Medicine Attending  Date: 05/31/2012  Patient name: William [name] Medical record number: [id number] Date of birth: 03-17-1946 Age: 66 y.o. Gender: male  I saw and evaluated the patient, and discussed his care with house staff. I reviewed the resident's note by Dr. Para Skeans and I agree with the resident's findings and plans as documented in his note.  Dr. Eppie Gibson will take over as attending physician tomorrow 06/01/2012.

## 2012-05-31 NOTE — Progress Notes (Signed)
Talked and educated the patient & family about the alcohol cessation, pt refused to have any outpatient cessation clinic and assistance. He was asking "the drug for me to stop Drinking", I believe he was talking about the antabuse treatment. Talked to one of the internal medicine doctor and he asked me to call Dr.McTyre. While I was waiting after paged him the family requested to get the paper work for them to go, and stated, they will make arrangement with Dr.Kollar on the appointment next Monday. Educated the patient about the importance of stop drinking alcohol. Did all care and took him out in a wheelchair. No other concerns.

## 2012-05-31 NOTE — Clinical Social Work Psychosocial (Signed)
Clinical Social Work Department BRIEF PSYCHOSOCIAL ASSESSMENT 05/31/2012  Patient:  William [name]     Account Number:  [id number]     Admit date:  05/29/2012  Clinical Social Worker:  Wylene Men  Date/Time:  05/31/2012 01:13 PM  Referred by:  RN  Date Referred:  05/31/2012 Referred for Substance Abuse SNF Placement  Other Referral:   none  Interview type:  Patient Other interview type:   none   PSYCHOSOCIAL DATA Living Status:  ALONE Admitted from facility:   Level of care:   Primary support name:  "friend" Primary support relationship to patient:  FRIEND Degree of support available:   pt seems to have support from friends.  Pt also states he has 5 children who are adults as well as two sisters and a neice that are active in his life.   CURRENT CONCERNS  Other Concerns:   MD is req pt d/c to SNF.  Pt walks and drives.  Pt does not think he has a skilled need.   SOCIAL WORK ASSESSMENT / PLAN CSW assessed pt at bedside.  Pt was alert and oriented x4. Pt was appropriate in conversation.  Pt has 5 children and two living sisters.  He had two brothers- both of which are deceased.  The last brother died less than 6 months ago. Pt is happy with his home situation and receives care from community agencies.  He ambulates with little assistance and prior to admission was driving.  Pt does not feel as if he has a skilled need.  His wishes are to return home. When assessing his drug use.  Pt denies any drug use.  Pt response was, "back in the day, I liked to do a little crack...but not no more".  Pt stated he hasn't touched "that stuff" in years.  Pt UDS was positive for cocaine.  I brought this up to the pt. Pt stated that it was "in the air".  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  He stated thta this must be the way it got into his system.  Pt denies drug use and denies needing help for cessation.  Assessment/plan status:   Psychosocial Support/Ongoing Assessment of Needs Other assessment/ plan:   CSW will be of assistance as needed if pt wishes to go to SNF or if pt requests assistance with the cessation of drug related issues.  Information/referral to community resources:   SNF and information/education on drug abuse   PATIENT'S/FAMILY'S RESPONSE TO PLAN OF CARE: Pt was responsive and appreciative of the CSW assistance.       Nonnie Done, [place] [phone]  Clinical Social Work

## 2012-05-31 NOTE — Progress Notes (Signed)
CSW spoke to step-daughter Jeani Hawking, her mother and a friend in re: as to why a Education officer, museum was consulted for pt.  CSW advised step-daughter that pt has full capacity and CSW could not discuss this with her unless the pt gives consent.  Per pt step-daughter abuses drugs daily and lives at his residence.  CSW suggests this may be an appropriate reason the step-daughter does not want social work involved with pt- fear of home visit.  Though step-daughter was agitated, she seemed understanding. Nonnie Done, [place] [phone]  Clinical Social Work

## 2012-05-31 NOTE — Progress Notes (Signed)
Subjective: No complaints this morning. Denies tremors. Denies fevers, chills, N/V/D, chest pain, or SOB. States he feels great and is ready to go home.  No interval events.  Objective: Vital signs in last 24 hours: Filed Vitals:   05/30/12 1422 05/30/12 1835 05/30/12 2125 05/31/12 0506  BP: 117/65 118/72 131/67 131/60  Pulse: 71 70 73 66  Temp: 98.1 F (36.7 C) 98.2 F (36.8 C) 98.1 F (36.7 C) 98.1 F (36.7 C)  TempSrc: Oral Oral Oral Oral  Resp: 20 20 18 20   Height:      Weight:      SpO2: 100% 100% 100% 100%   Weight change:   Intake/Output Summary (Last 24 hours) at 05/31/12 0952 Last data filed at 05/31/12 0905  Gross per 24 hour  Intake   2880 ml  Output   3100 ml  Net   -220 ml   Physical Exam: General: Awake and alert; NAD CV: Regular rate and rhythm; no murmurs, rubs, or gallops Resp: Clear to auscultation bilaterally; no wheezes, rales, or rhonchi GI/Abd: Soft, non-tender, non-distended; normoactive bowel sounds Ext: 2+ pulses in all extremities; no edema, clubbing, or cyanosis Skin: Warm, dry, intact  Lab Results: Basic Metabolic Panel:  Lab [id number] 1801 05/30/12 0505 05/29/12 0345  NA 134* 128* --  K 4.5 3.8 --  CL 100 95* --  CO2 24 23 --  GLUCOSE 174* 123* --  BUN 18 21 --  CREATININE 1.31 1.63* --  CALCIUM 9.7 9.4 --  MG -- 1.5 1.6  PHOS -- -- 3.1   Liver Function Tests:  Lab 05/30/12 0505 05/28/12 0928  AST 16 21  ALT 12 13  ALKPHOS 55 62  BILITOT 0.3 0.4  PROT 6.3 6.8  ALBUMIN 3.5 3.7   CBC:  Lab 05/30/12 0505 05/29/12 0345 05/28/12 0928  WBC 5.3 5.8 --  NEUTROABS 3.3 -- 9.2*  HGB 11.7* 12.8* --  HCT 33.7* 37.7* --  MCV 96.3 96.9 --  PLT 154 181 --   CBG:  Lab 05/31/12 0757 05/31/12 0426 05/31/12 0021 05/30/12 2017 05/30/12 1640 05/30/12 1125  GLUCAP 113* 127* 111* 169* 125* 119*   Hemoglobin A1C:  Lab 05/28/12 2027  HGBA1C 5.8*   Coagulation:  Lab 05/28/12 0928  LABPROT 14.8  INR 1.14   Anemia Panel:  Lab  05/29/12 1248  VITAMINB12 645  FOLATE --  FERRITIN --  TIBC --  IRON --  RETICCTPCT --   Urine Drug Screen: Drugs of Abuse     Component Value Date/Time   LABOPIA NONE DETECTED 05/28/2012 0944   COCAINSCRNUR NONE DETECTED 05/28/2012 0944   LABBENZ POSITIVE* 05/28/2012 0944   AMPHETMU NONE DETECTED 05/28/2012 0944   THCU NONE DETECTED 05/28/2012 0944   LABBARB NONE DETECTED 05/28/2012 0944    Alcohol Level:  Lab 05/28/12 0928  ETH <11   Urinalysis:  Lab 05/28/12 0944  COLORURINE YELLOW  LABSPEC 1.015  PHURINE 5.0  GLUCOSEU 500*  HGBUR NEGATIVE  BILIRUBINUR NEGATIVE  KETONESUR NEGATIVE  PROTEINUR NEGATIVE  UROBILINOGEN 0.2  NITRITE NEGATIVE  LEUKOCYTESUR NEGATIVE   Micro Results: Recent Results (from the past 240 hour(s))  URINE CULTURE     Status: Normal   Collection Time   05/28/12  9:44 AM      Component Value Range Status Comment   Specimen Description URINE, CATHETERIZED   Final    Special Requests NONE   Final    Culture  Setup Time 05/28/2012 11:18   Final  Colony Count NO GROWTH   Final    Culture NO GROWTH   Final    Report Status 05/29/2012 FINAL   Final    Studies/Results: Mr Brain Wo Contrast  05/29/2012  *RADIOLOGY REPORT*  Clinical Data: New onset seizure  MRI HEAD WITHOUT CONTRAST  Technique:  Multiplanar, multiecho pulse sequences of the brain and surrounding structures were obtained according to standard protocol without intravenous contrast.  Comparison: CT head without contrast 05/28/2012.  Findings: No acute infarct, hemorrhage, mass lesion is present. Moderate generalized atrophy is present.  Confluent periventricular and scattered subcortical T2 and FLAIR hyperintensities are greater than expected for age.  The ventricles are proportionate to the degree of atrophy.  Flow is present in the major intracranial arteries.  No significant extra-axial fluid collection is present.  Chronic right maxillary sinus disease is present.  Anterior ethmoid air  cells are obstructed on the right.  Moderate right frontal sinus disease is present.  There is fluid in the mastoid air cells bilaterally.  No obstructing nasopharyngeal lesion is evident.  IMPRESSION:  1.  No acute intracranial abnormality. 2.  Age advanced atrophy and extensive white matter disease.  This likely reflects the sequelae of chronic microvascular ischemia in this patient with diabetes, hypertension, and hyperlipidemia.   Original Report Authenticated By: Resa Miner. MATTERN, M.D.    Medications: I have reviewed the patient's current medications. Scheduled Meds:   . FLUoxetine  40 mg Oral Daily  . folic acid  1 mg Oral Daily  . heparin  5,000 Units Subcutaneous Q8H  . insulin aspart  0-9 Units Subcutaneous Q4H  . oxybutynin  5 mg Oral Daily  . pneumococcal 23 valent vaccine  0.5 mL Intramuscular Tomorrow-1000  . simvastatin  20 mg Oral QPM  . thiamine  100 mg Oral Daily   Continuous Infusions:   . sodium chloride 150 mL/hr at 05/31/12 0142   PRN Meds:.LORazepam, LORazepam, ondansetron (ZOFRAN) IV, ondansetron Assessment/Plan: 66 year-old man with PMH significant for DM2, HLP, EtOH abuse, who presented to ED via EMS for evaluation and treatment of witnessed seizure.  1. Seizure: At his presentation it was unclear if this was a new onset seizure. His CT head without contrast, however, is negative for acute abnormalities, making this less likely seizure secondary to head trauma. MRI head was also negative for acute or chronic focal abnormalities.   No recurrent seizure, he reports feeling well today and continues to relate he is ready to return home. Has not required benzos but formal CIWA doesn't appear to have been noted. -Continue seizure precautions  -re-order CIWA protocol  -cont thiamine/folate/MV  -Neurology signed off   2. Hyponatremia: intermittent, nearly resolved currently. Thought likely secondary to hypovolemic hyponatremia, NA now 134 after recently resuming  diet. Patient fluid intake is very frequent per nursing.  -Continue IVF hydration, NS @150ml /h x 6 hr and follow BMP  -fluid restrict to 1000 ml/day for now  -will monitor I/O   3. Acute Renal Failure: baseline Cr 1.0, Cr = 1.3 today, continuing to trend towards baseline. Urine output in previous 24 hours was ~3.5L. -Continue to follow creatinine  - Continue IVF  4. Fever: Resolved.  5. DM II: diet control at home, HbA1c on admission 5.8.  -continue SSI-sensitive while in hospital   6. HTN: well controlled, will hold ACEi in setting of acute renal failure and hctz in setting of volume contraction.   7. Tachycardia: Resolved, likely 2/2 agitation and stress/seizure on admission.   8.  Alcohol Abuse: CIWA protocol, cont thiamine and folate. Alcohol cessation counseling .   9. HLP: well controlled, lipid panel in 2/213 showed LDL of 89. Will continue Zocor 20mg  qhs   10. DVT ppx: Heparin SQ TID  11. Dispo: Patient feeling great and no active medical issues. Likely discharge home today.   LOS: 3 days   Rockie Schnoor R 05/31/2012, 9:52 AM

## 2012-05-31 NOTE — Discharge Summary (Signed)
Patient Name:  William [name] MRN: [id number]  PCP: Olga Millers, MD DOB:  24-Jun-1946       Date of Admission:  05/28/2012  Date of Discharge:  06/01/2012     Attending Physician: Dr. Bertha Stakes         DISCHARGE DIAGNOSES: Seizure Hyponatremia AKI DM II HTN Alcohol Abuse  HLP  DISPOSITION AND FOLLOW-UP: William [name] is to follow-up with the listed providers as detailed below, at which time, the following should be addressed:   1. Follow-up on admission for EtOH-withdrawal seizures. Assess patient's efforts towards EtOH cessation. 2. Follow-up to assure resolution of hyponatremia 3. Anti-HTNs were held at time of discharge 2/2 hyponatremia and normotension without medication. Reassess need for these meds going forward. 4. Labs / imaging needed: BMET, CBG 5. Pending labs/ test needing follow-up: N/A  Follow-up Information    You have an appointment scheduled with Vertell Novak, MD.   Contact information:   [address] [place] [phone]         Discharge Orders    Future Orders Please Complete By Expires   Increase activity slowly      Call MD for:  persistant dizziness or light-headedness      Call MD for:  extreme fatigue          DISCHARGE MEDICATIONS: Medication List  As of 06/05/2012  4:57 PM   STOP taking these medications         hydrochlorothiazide 25 MG tablet      lisinopril 40 MG tablet         TAKE these medications         FLUoxetine 40 MG capsule   Commonly known as: PROZAC   Take 40 mg by mouth daily.      ibuprofen 200 MG tablet   Commonly known as: ADVIL,MOTRIN   Take 200-800 mg by mouth every 6 (six) hours as needed. For pain      loperamide 2 MG capsule   Commonly known as: IMODIUM   Take 2 mg by mouth 4 (four) times daily as needed. For diarrhea      oxybutynin 5 MG 24 hr tablet   Commonly known as: DITROPAN-XL   Take 5 mg by mouth daily.      simvastatin 20 MG  tablet   Commonly known as: ZOCOR   Take 1 tablet (20 mg total) by mouth every evening.             CONSULTS:  Neurology   PROCEDURES PERFORMED:  Ct Head Wo Contrast  05/28/2012  *RADIOLOGY REPORT*  Clinical Data: Seizure  CT HEAD WITHOUT CONTRAST  Technique:  Contiguous axial images were obtained from the base of the skull through the vertex without contrast.  Comparison: 11/29/2011  Findings: Moderate, generalized atrophy.  Mild periventricular white matter hypodensity is unchanged and appears chronic. Negative for acute infarct.  Negative for hemorrhage or mass.  Chronic sinusitis with opacification of the right maxillary sinus, unchanged.  Calvarium intact.  IMPRESSION: Atrophy and mild chronic microvascular ischemic changes in the white matter.  No acute abnormality.  Chronic sinusitis.   Original Report Authenticated By: Truett Perna, M.D.    Mr Brain Wo Contrast  05/29/2012  *RADIOLOGY REPORT*  Clinical Data: New onset seizure  MRI HEAD WITHOUT CONTRAST  Technique:  Multiplanar, multiecho pulse sequences of the brain and surrounding structures were obtained according to standard protocol without intravenous contrast.  Comparison: CT head without contrast 05/28/2012.  Findings: No acute infarct, hemorrhage, mass lesion is present. Moderate generalized atrophy is present.  Confluent periventricular and scattered subcortical T2 and FLAIR hyperintensities are greater than expected for age.  The ventricles are proportionate to the degree of atrophy.  Flow is present in the major intracranial arteries.  No significant extra-axial fluid collection is present.  Chronic right maxillary sinus disease is present.  Anterior ethmoid air cells are obstructed on the right.  Moderate right frontal sinus disease is present.  There is fluid in the mastoid air cells bilaterally.  No obstructing nasopharyngeal lesion is evident.  IMPRESSION:  1.  No acute intracranial abnormality. 2.  Age advanced atrophy  and extensive white matter disease.  This likely reflects the sequelae of chronic microvascular ischemia in this patient with diabetes, hypertension, and hyperlipidemia.   Original Report Authenticated By: Resa Miner. MATTERN, M.D.    Dg Chest Portable 1 View  05/28/2012  *RADIOLOGY REPORT*  Clinical Data: Seizures.  Diabetes.  Hypertension.  PORTABLE CHEST - 1 VIEW  Comparison: 07/02/2004  Findings: Upper normal heart size noted.  Old left-sided rib fractures are present along with evidence of cervical spondylosis. Mild aortic tortuosity is present.  The lungs appear clear.  IMPRESSION:  1.  Old left-sided rib fractures. 2.  No acute findings. 3.  Cervical spondylosis.   Original Report Authenticated By: Carron Curie, M.D.        ADMISSION DATA: H&P: William [name] is a 66 yo man with PMH of DM, HTN, HLP, alcohol abuse who presents to the ED via EMS for seizure. The patient was unable to provide much history at the time of this interview, with his stepdaughter unreachable by phone. Most of this HPI comes from the ED notes. He was in his usual state of health until today when his step-daughter found him grunting, drooling, with eyes rolled back and shaking of his arms and legs. Per his step daughter, this lasted for almost 30 minutes. CBG at the time of his arrival was 241, bowel or bladder incontinence were unknown, with not trauma or vomiting noted. Per his stepdaughter's original reports, the patient drinks at least a case of beers per day and his last drink was 3 days ago. At the ED she also reported that a window had fallen on the patient's head the day prior to his admission.  Upon arrival to the ED he was given Versed 5mg , with no seizure activity noted. He remained agitated, and tachycardic at the ED and was given 1mg  of Ativan, plus a thiamine injection. His agitation improved though he remained confused but able to follow simple commands by the time the IM team arrived.   Physical  Exam: Blood pressure 142/81, pulse 100, temperature 100.5 F (38.1 C), temperature source Rectal, resp. rate 18, SpO2 99.00%.  General: confused but open eyes when called by name, follows some commands.  Head: Several ~1cm superficial cuts on his scalp. Hair shaved on anterior calvarium only.  Eyes: pupils equal, pupils round, pupils reactive to light, no injection and anicteric.  Mouth: pharynx pink and moist, no erythema, and no exudates. edentelous. Neck: supple, full ROM, no thyromegaly,  Lungs: normal respiratory effort, no accessory muscle use, normal breath sounds, no crackles, and no wheezes. Heart: normal rate, regular rhythm, no murmur, no gallop, and no rub.  Abdomen: soft, non-tender, normal bowel sounds, no distention, no guarding, no rebound tenderness.  Msk: no joint swelling, no joint warmth, and no  redness over joints.  Pulses: 2+ DP/PT pulses bilaterally Extremities: No cyanosis, clubbing, edema  Neurologic: alert, moderately confused, yawning, unable to asses orientation, cranial nerves grossly intact, unable to assess strength and sensation secondary to patient's moderate confusion.  Skin: turgor normal and no rashes.    Labs: Basic Metabolic Panel:   [facility]  05/28/12 0928   NA  131*   K  4.8   CL  97   CO2  22   GLUCOSE  188*   BUN  13   CREATININE  1.49*   CALCIUM  9.3   MG  --   PHOS  --    Liver Function Tests:   [facility]  05/28/12 0928   AST  21   ALT  13   ALKPHOS  62   BILITOT  0.4   PROT  6.8   ALBUMIN  3.7    CBC:   [facility]  05/28/12 0928   WBC  10.2   NEUTROABS  9.2*   HGB  12.6*   HCT  36.3*   MCV  95.8   PLT  156    Coagulation:   Basename  05/28/12 0928   LABPROT  14.8   INR  1.14    Urine Drug Screen:  Drugs of Abuse    Component  Value  Date/Time    LABOPIA  NONE DETECTED  05/28/2012 0944    COCAINSCRNUR  NONE DETECTED  05/28/2012 0944    LABBENZ  POSITIVE*  05/28/2012 0944    AMPHETMU  NONE DETECTED  05/28/2012 0944     THCU  NONE DETECTED  05/28/2012 0944    LABBARB  NONE DETECTED  05/28/2012 0944    Alcohol Level:   Basename  05/28/12 0928   ETH  <11    Urinalysis:   Basename  05/28/12 0944   COLORURINE  YELLOW   LABSPEC  1.015   PHURINE  5.0   GLUCOSEU  500*   HGBUR  NEGATIVE   BILIRUBINUR  NEGATIVE   KETONESUR  NEGATIVE   PROTEINUR  NEGATIVE   UROBILINOGEN  0.2   NITRITE  NEGATIVE   LEUKOCYTESUR  NEGATIVE    HOSPITAL COURSE: Seizure - Likely secondary to alcohol withdrawal. The patient has history of alcoholism with last drink more than three days PTA. No recurrence of seizure during hospital course, and no significant stigmata of EtOH withdrawal.  CT head negative for hemorrhage. Patient received versed and ativan in the ED, but did not require benzos for the remainder of his hospital course. Neurology was consulted. MRI did not reveal any discrete area concerning for ischemia or other acute abnormality, and only age-related atrophy and microvascular changes deemed secondary to DM, HTN, HLD. EEG did not reveal any specific findings. Neuro subsequently signed off, with no further recs.  Alcoholism - patient counseled on alcohol cessation. States he desires to quit drinking, but declines any psychiatric input. Received IV thiamine, folate, as well as a multivitamin during hospital course. CIWA protocol was in place during course, but patient never had any concerning CIWA scores and did not require ativan.   Hyponatremia - likely hypovolemic hyponatremia. Improved throughout course and was nearly resolved at time of discharge. HCTZ held in setting of this issue at time of discharge.  Acute kidney injury - baseline Cr 1.0. Cr ~1.5 on admission, trending down to 1.3 at time of discharge.  DM II - moderately elevated CBGs during hospital course. A1c = 5.8 on 05/28/2012. Currently diet controlled.  HTN - stable during hospital course without home ACEi or HCTZ. These meds were d/c'd at time of  discharge given hyponatremia which was still resolving (BP normal through much of course without med intervention).  HLD - stable on home statin. LDL = 89 in 10/2011.  Dispo - patient declines the option of going to rehab facility or SNF for recovery from EtOH dependence, but states that he will be attempting to continue to abstain from alcohol use. He requested "pills" for his alcohol abuse issue to nursing just prior to discharge, but decided that he would rather leave versus having another discussion with the primary team regarding the issue at that time (per nursing note).   DISCHARGE DATA: Vital Signs: BP 161/85  Pulse 70  Temp 97.6 F (36.4 C) (Oral)  Resp 20  Ht 6' (1.829 m)  Wt 185 lb 3 oz (84 kg)  BMI 25.12 kg/m2  SpO2 100%   Signed: Gae Gallop, MD   PGY I, Internal Medicine Resident 06/05/2012, 4:57 PM

## 2012-06-01 NOTE — Procedures (Signed)
EEG Report  Ordering Physician: Dr. Doy Mince Date of service: 05/31/2012  Photic stimulation: yes Hyperventilation: No  THIS IS  A 66 Y/O MALE WITH NEW ONSET OF SEIZURE RELATED TO ALCOHOL WITHDRAWAL HISTORY OF ALCOHOL ABUSE, DM, HLD, ANXIETY  WHO WAS FOUND DOWN AND GRUNTING, DROOLING, EYES ROLLED BACK AND THE EPISODE LASTED FOR 30 MINUTES AND WAS STOPPED BY ATIVAN. EEG WAS DONE TO RULE OUT STATUS EPILEPTICUS AND OR SEIZURES  EEG: 17 channel [place]   EEG is significantly diffusely slow without asymetry.   Posterior dominant rhythm is poorly defined and modulated There is significant muscle and electrode artifact through out.   There is significant eye flutter but there is no significant slowing thereafter, EEG comes back to baseline post flutter. No epileptiform discharges or ictal runs noticed. No increase in beta rhythm is noticed either which is generally seen with Ativan.  Stage 2 sleep was not captured, drowsiness was captured.  There are areas of EEG with sub optimal  Secondary to artifact  Clinical Interpretation: This is  normal but diffusely slow bi hemispherically without any evidence of seizures, epileptiform discharges, and or ictal runs.   Nahomi Hegner V-P Donnie Mesa., MD., Ph.D.,MS 06/01/2012 10:00 AM

## 2012-06-04 ENCOUNTER — Ambulatory Visit (INDEPENDENT_AMBULATORY_CARE_PROVIDER_SITE_OTHER): Payer: Medicare Other | Admitting: Internal Medicine

## 2012-06-04 ENCOUNTER — Encounter: Payer: Self-pay | Admitting: Internal Medicine

## 2012-06-04 VITALS — BP 120/80 | HR 70 | Temp 97.7°F | Ht 68.0 in | Wt 186.9 lb

## 2012-06-04 DIAGNOSIS — R569 Unspecified convulsions: Secondary | ICD-10-CM

## 2012-06-04 DIAGNOSIS — E119 Type 2 diabetes mellitus without complications: Secondary | ICD-10-CM

## 2012-06-04 DIAGNOSIS — E785 Hyperlipidemia, unspecified: Secondary | ICD-10-CM

## 2012-06-04 DIAGNOSIS — F10939 Alcohol use, unspecified with withdrawal, unspecified: Secondary | ICD-10-CM

## 2012-06-04 DIAGNOSIS — F10239 Alcohol dependence with withdrawal, unspecified: Secondary | ICD-10-CM

## 2012-06-04 NOTE — Patient Instructions (Signed)
You were seen today for a follow up. We would like you to STOP taking lisinopril. We have put a sticker on it. We will call you with your lab results and give you a flu shot today. If you have any problems or questions please call at [phone].

## 2012-06-05 ENCOUNTER — Encounter (HOSPITAL_COMMUNITY): Payer: Self-pay | Admitting: Radiation Oncology

## 2012-06-05 LAB — BASIC METABOLIC PANEL WITH GFR
GFR, Est African American: 66 mL/min
GFR, Est Non African American: 57 mL/min — ABNORMAL LOW
Potassium: 4 mEq/L (ref 3.5–5.3)
Sodium: 138 mEq/L (ref 135–145)

## 2012-06-06 NOTE — Assessment & Plan Note (Signed)
Patient does continue to take zocor and is at goal.

## 2012-06-06 NOTE — Assessment & Plan Note (Signed)
No recurrence since getting home although he does "occasioanally" still drink per himself. Advised complete cessation.

## 2012-06-06 NOTE — Assessment & Plan Note (Signed)
Not on any medications and HgA1c was 5.8 about 1 week ago. Will continue to watch and follow.

## 2012-06-06 NOTE — Progress Notes (Signed)
Subjective:     Patient ID: William [name], male   DOB: [birth year], 66 y.o.   MRN: [id number]  HPI The patient is a 66 year old male who comes in today for a hospital follow up. At time of visit D/C summary not done however review of hospital stay indicates AKI with some convulsions from alcohol withdrawal. He has not had any problems after leaving the hospital. He was unclear that he was supposed to be stopping his anti-hypertensives and continued to take HCTZ and lisinopril after leaving the hospital. He is still making urine at home and no dizziness noted. His BP is well controlled today. No chest pain or SOB. No nausea, vomiting, diarrhea.   Review of Systems  Constitutional: Negative for fever, chills, diaphoresis, activity change, appetite change, fatigue and unexpected weight change.  HENT: Negative.   Respiratory: Negative for cough, chest tightness, shortness of breath and wheezing.   Cardiovascular: Negative for chest pain, palpitations and leg swelling.  Gastrointestinal: Positive for abdominal distention. Negative for nausea, vomiting, abdominal pain, diarrhea, constipation and blood in stool.  Musculoskeletal: Negative.   Neurological: Negative for dizziness, tremors, seizures, syncope, facial asymmetry, speech difficulty, weakness, light-headedness, numbness and headaches.  Hematological: Negative for adenopathy. Does not bruise/bleed easily.  Psychiatric/Behavioral: Negative.        Objective:   Physical Exam  Constitutional: He is oriented to person, place, and time. He appears well-developed and well-nourished. No distress.  HENT:  Head: Normocephalic and atraumatic.  Eyes: EOM are normal. Pupils are equal, round, and reactive to light.  Neck: Normal range of motion. Neck supple.  Cardiovascular: Normal rate and regular rhythm.   Pulmonary/Chest: Effort normal and breath sounds normal. No respiratory distress. He has no wheezes. He has no rales.  Abdominal: Soft. Bowel  sounds are normal. He exhibits distension. He exhibits no mass. There is no tenderness. There is no rebound and no guarding.  Musculoskeletal: Normal range of motion. He exhibits no edema and no tenderness.  Neurological: He is alert and oriented to person, place, and time. No cranial nerve deficit.  Skin: Skin is warm and dry. He is not diaphoretic.  Psychiatric: He has a normal mood and affect. His behavior is normal.       Assessment:   1. Please see problem oriented charting.  2. Hospital follow up - The patient was advised to not take lisinopril and a label to not take was placed on the bottle until his BMP was back. He was allowed to continue HCTZ. No other changes to his regimen and BMP drawn at today's visit.   3. Disposition - Patient stopped lisinopril at today's visit. No other changes to regimen. He did receive flu shot at today's visit.

## 2012-06-07 DIAGNOSIS — E119 Type 2 diabetes mellitus without complications: Secondary | ICD-10-CM | POA: Diagnosis not present

## 2012-06-07 DIAGNOSIS — Z23 Encounter for immunization: Secondary | ICD-10-CM

## 2012-07-30 ENCOUNTER — Other Ambulatory Visit: Payer: Self-pay | Admitting: Internal Medicine

## 2012-08-20 ENCOUNTER — Inpatient Hospital Stay (HOSPITAL_COMMUNITY)
Admission: EM | Admit: 2012-08-20 | Discharge: 2012-08-22 | DRG: 641 | Disposition: A | Discharge status: Home Health | Payer: Medicare Other | Attending: Internal Medicine | Admitting: Internal Medicine

## 2012-08-20 ENCOUNTER — Emergency Department (HOSPITAL_COMMUNITY): Payer: Medicare Other

## 2012-08-20 DIAGNOSIS — E8729 Other acidosis: Secondary | ICD-10-CM | POA: Diagnosis present

## 2012-08-20 DIAGNOSIS — E871 Hypo-osmolality and hyponatremia: Secondary | ICD-10-CM | POA: Diagnosis present

## 2012-08-20 DIAGNOSIS — Z794 Long term (current) use of insulin: Secondary | ICD-10-CM

## 2012-08-20 DIAGNOSIS — F1011 Alcohol abuse, in remission: Secondary | ICD-10-CM | POA: Diagnosis present

## 2012-08-20 DIAGNOSIS — E119 Type 2 diabetes mellitus without complications: Secondary | ICD-10-CM | POA: Diagnosis present

## 2012-08-20 DIAGNOSIS — F1010 Alcohol abuse, uncomplicated: Secondary | ICD-10-CM

## 2012-08-20 DIAGNOSIS — F10229 Alcohol dependence with intoxication, unspecified: Secondary | ICD-10-CM | POA: Diagnosis present

## 2012-08-20 DIAGNOSIS — R571 Hypovolemic shock: Secondary | ICD-10-CM

## 2012-08-20 DIAGNOSIS — F411 Generalized anxiety disorder: Secondary | ICD-10-CM | POA: Diagnosis present

## 2012-08-20 DIAGNOSIS — E785 Hyperlipidemia, unspecified: Secondary | ICD-10-CM | POA: Diagnosis present

## 2012-08-20 DIAGNOSIS — I959 Hypotension, unspecified: Secondary | ICD-10-CM | POA: Diagnosis present

## 2012-08-20 DIAGNOSIS — E876 Hypokalemia: Secondary | ICD-10-CM | POA: Diagnosis present

## 2012-08-20 DIAGNOSIS — I10 Essential (primary) hypertension: Secondary | ICD-10-CM | POA: Diagnosis present

## 2012-08-20 DIAGNOSIS — F10129 Alcohol abuse with intoxication, unspecified: Secondary | ICD-10-CM

## 2012-08-20 DIAGNOSIS — E1110 Type 2 diabetes mellitus with ketoacidosis without coma: Secondary | ICD-10-CM

## 2012-08-20 DIAGNOSIS — N179 Acute kidney failure, unspecified: Secondary | ICD-10-CM | POA: Diagnosis present

## 2012-08-20 DIAGNOSIS — F10239 Alcohol dependence with withdrawal, unspecified: Secondary | ICD-10-CM | POA: Diagnosis present

## 2012-08-20 DIAGNOSIS — Z79899 Other long term (current) drug therapy: Secondary | ICD-10-CM

## 2012-08-20 DIAGNOSIS — E872 Acidosis, unspecified: Principal | ICD-10-CM | POA: Diagnosis present

## 2012-08-20 DIAGNOSIS — F10939 Alcohol use, unspecified with withdrawal, unspecified: Secondary | ICD-10-CM | POA: Diagnosis present

## 2012-08-20 DIAGNOSIS — E46 Unspecified protein-calorie malnutrition: Secondary | ICD-10-CM | POA: Diagnosis present

## 2012-08-20 DIAGNOSIS — F17200 Nicotine dependence, unspecified, uncomplicated: Secondary | ICD-10-CM | POA: Diagnosis present

## 2012-08-20 DIAGNOSIS — E878 Other disorders of electrolyte and fluid balance, not elsewhere classified: Secondary | ICD-10-CM | POA: Diagnosis present

## 2012-08-20 LAB — CBC WITH DIFFERENTIAL/PLATELET
Basophils Absolute: 0 K/uL (ref 0.0–0.1)
Basophils Relative: 1 % (ref 0–1)
Eosinophils Absolute: 0 K/uL (ref 0.0–0.7)
Eosinophils Relative: 0 % (ref 0–5)
HCT: 40.1 % (ref 39.0–52.0)
Hemoglobin: 14.3 g/dL (ref 13.0–17.0)
Lymphocytes Relative: 29 % (ref 12–46)
Lymphs Abs: 1.6 K/uL (ref 0.7–4.0)
MCH: 32.6 pg (ref 26.0–34.0)
MCHC: 35.7 g/dL (ref 30.0–36.0)
MCV: 91.3 fL (ref 78.0–100.0)
Monocytes Absolute: 0.4 K/uL (ref 0.1–1.0)
Monocytes Relative: 7 % (ref 3–12)
Neutro Abs: 3.5 K/uL (ref 1.7–7.7)
Neutrophils Relative %: 64 % (ref 43–77)
Platelets: 160 K/uL (ref 150–400)
RBC: 4.39 MIL/uL (ref 4.22–5.81)
RDW: 13.5 % (ref 11.5–15.5)
WBC: 5.6 K/uL (ref 4.0–10.5)

## 2012-08-20 LAB — COMPREHENSIVE METABOLIC PANEL WITH GFR
ALT: 19 U/L (ref 0–53)
AST: 39 U/L — ABNORMAL HIGH (ref 0–37)
Albumin: 3.2 g/dL — ABNORMAL LOW (ref 3.5–5.2)
Alkaline Phosphatase: 76 U/L (ref 39–117)
BUN: 28 mg/dL — ABNORMAL HIGH (ref 6–23)
CO2: 19 meq/L (ref 19–32)
Calcium: 8.9 mg/dL (ref 8.4–10.5)
Chloride: 83 meq/L — ABNORMAL LOW (ref 96–112)
Creatinine, Ser: 2.07 mg/dL — ABNORMAL HIGH (ref 0.50–1.35)
GFR calc Af Amer: 36 mL/min — ABNORMAL LOW
GFR calc non Af Amer: 31 mL/min — ABNORMAL LOW
Glucose, Bld: 283 mg/dL — ABNORMAL HIGH (ref 70–99)
Potassium: 2.8 meq/L — ABNORMAL LOW (ref 3.5–5.1)
Sodium: 128 meq/L — ABNORMAL LOW (ref 135–145)
Total Bilirubin: 0.6 mg/dL (ref 0.3–1.2)
Total Protein: 6.6 g/dL (ref 6.0–8.3)

## 2012-08-20 LAB — BLOOD GAS, VENOUS
Acid-base deficit: 2.3 mmol/L — ABNORMAL HIGH (ref 0.0–2.0)
Bicarbonate: 21.7 mEq/L (ref 20.0–24.0)
FIO2: 0.21 %
O2 Saturation: 42.9 %
pO2, Ven: 25.8 mmHg — CL (ref 30.0–45.0)

## 2012-08-20 LAB — GLUCOSE, CAPILLARY: Glucose-Capillary: 297 mg/dL — ABNORMAL HIGH (ref 70–99)

## 2012-08-20 LAB — LACTIC ACID, PLASMA: Lactic Acid, Venous: 7.8 mmol/L — ABNORMAL HIGH (ref 0.5–2.2)

## 2012-08-20 MED ORDER — DEXTROSE 5 % IV BOLUS
500.0000 mL | Freq: Once | INTRAVENOUS | Status: AC
  Administered 2012-08-21: 500 mL via INTRAVENOUS

## 2012-08-20 MED ORDER — SODIUM CHLORIDE 0.9 % IV BOLUS (SEPSIS)
2000.0000 mL | Freq: Once | INTRAVENOUS | Status: AC
  Administered 2012-08-20: 2000 mL via INTRAVENOUS

## 2012-08-20 MED ORDER — POTASSIUM CHLORIDE CRYS ER 20 MEQ PO TBCR
40.0000 meq | EXTENDED_RELEASE_TABLET | Freq: Once | ORAL | Status: AC
  Administered 2012-08-20: 40 meq via ORAL
  Filled 2012-08-20: qty 2

## 2012-08-20 MED ORDER — THIAMINE HCL 100 MG/ML IJ SOLN
100.0000 mg | Freq: Every day | INTRAMUSCULAR | Status: DC
  Administered 2012-08-20: 100 mg via INTRAVENOUS
  Filled 2012-08-20: qty 2

## 2012-08-20 MED ORDER — LACTATED RINGERS IV BOLUS (SEPSIS)
1000.0000 mL | Freq: Once | INTRAVENOUS | Status: AC
  Administered 2012-08-20: 1000 mL via INTRAVENOUS

## 2012-08-20 MED ORDER — MAGNESIUM SULFATE 40 MG/ML IJ SOLN
2.0000 g | Freq: Once | INTRAMUSCULAR | Status: AC
  Administered 2012-08-20: 2 g via INTRAVENOUS
  Filled 2012-08-20: qty 50

## 2012-08-20 MED ORDER — SODIUM CHLORIDE 0.9 % IV BOLUS (SEPSIS)
1000.0000 mL | Freq: Once | INTRAVENOUS | Status: AC
  Administered 2012-08-20: 1000 mL via INTRAVENOUS

## 2012-08-20 NOTE — ED Notes (Signed)
EMS called to home.  Found patient laying in vomit and feces. Neighbor called EMS after patient had been drinking for 5 days. Patient was combative to be brought in by ems.

## 2012-08-20 NOTE — ED Notes (Signed)
Notified RN, Topher of low blood pressure. Rechecked blood pressure of 71/47.

## 2012-08-21 ENCOUNTER — Encounter (HOSPITAL_COMMUNITY): Payer: Self-pay | Admitting: *Deleted

## 2012-08-21 DIAGNOSIS — F1010 Alcohol abuse, uncomplicated: Secondary | ICD-10-CM

## 2012-08-21 DIAGNOSIS — E871 Hypo-osmolality and hyponatremia: Secondary | ICD-10-CM

## 2012-08-21 DIAGNOSIS — R578 Other shock: Secondary | ICD-10-CM

## 2012-08-21 DIAGNOSIS — I959 Hypotension, unspecified: Secondary | ICD-10-CM | POA: Diagnosis present

## 2012-08-21 DIAGNOSIS — E876 Hypokalemia: Secondary | ICD-10-CM

## 2012-08-21 DIAGNOSIS — N179 Acute kidney failure, unspecified: Secondary | ICD-10-CM | POA: Diagnosis present

## 2012-08-21 DIAGNOSIS — E872 Acidosis: Secondary | ICD-10-CM | POA: Diagnosis present

## 2012-08-21 DIAGNOSIS — E878 Other disorders of electrolyte and fluid balance, not elsewhere classified: Secondary | ICD-10-CM | POA: Diagnosis present

## 2012-08-21 DIAGNOSIS — E119 Type 2 diabetes mellitus without complications: Secondary | ICD-10-CM

## 2012-08-21 LAB — BASIC METABOLIC PANEL
BUN: 26 mg/dL — ABNORMAL HIGH (ref 6–23)
BUN: 24 mg/dL — ABNORMAL HIGH (ref 6–23)
CO2: 25 mEq/L (ref 19–32)
Chloride: 99 mEq/L (ref 96–112)
Creatinine, Ser: 1.41 mg/dL — ABNORMAL HIGH (ref 0.50–1.35)
Glucose, Bld: 247 mg/dL — ABNORMAL HIGH (ref 70–99)
Glucose, Bld: 125 mg/dL — ABNORMAL HIGH (ref 70–99)
Potassium: 3.7 mEq/L (ref 3.5–5.1)
Potassium: 4.1 mEq/L (ref 3.5–5.1)

## 2012-08-21 LAB — URINALYSIS, ROUTINE W REFLEX MICROSCOPIC
Leukocytes, UA: NEGATIVE
Protein, ur: 30 mg/dL — AB
Specific Gravity, Urine: 1.016 (ref 1.005–1.030)
Urobilinogen, UA: 0.2 mg/dL (ref 0.0–1.0)

## 2012-08-21 LAB — LACTIC ACID, PLASMA
Lactic Acid, Venous: 4.3 mmol/L — ABNORMAL HIGH (ref 0.5–2.2)
Lactic Acid, Venous: 3 mmol/L — ABNORMAL HIGH (ref 0.5–2.2)

## 2012-08-21 LAB — CBC WITH DIFFERENTIAL/PLATELET
Eosinophils Absolute: 0 10*3/uL (ref 0.0–0.7)
Eosinophils Relative: 1 % (ref 0–5)
Hemoglobin: 10.8 g/dL — ABNORMAL LOW (ref 13.0–17.0)
Lymphs Abs: 1.5 10*3/uL (ref 0.7–4.0)
MCH: 31.9 pg (ref 26.0–34.0)
MCHC: 34.7 g/dL (ref 30.0–36.0)
MCV: 91.7 fL (ref 78.0–100.0)
Monocytes Absolute: 0.3 10*3/uL (ref 0.1–1.0)
Monocytes Relative: 7 % (ref 3–12)
RBC: 3.39 MIL/uL — ABNORMAL LOW (ref 4.22–5.81)

## 2012-08-21 LAB — GLUCOSE, CAPILLARY
Glucose-Capillary: 189 mg/dL — ABNORMAL HIGH (ref 70–99)
Glucose-Capillary: 171 mg/dL — ABNORMAL HIGH (ref 70–99)
Glucose-Capillary: 152 mg/dL — ABNORMAL HIGH (ref 70–99)
Glucose-Capillary: 153 mg/dL — ABNORMAL HIGH (ref 70–99)

## 2012-08-21 LAB — URINE MICROSCOPIC-ADD ON

## 2012-08-21 LAB — COMPREHENSIVE METABOLIC PANEL
ALT: 16 U/L (ref 0–53)
AST: 30 U/L (ref 0–37)
Alkaline Phosphatase: 57 U/L (ref 39–117)
CO2: 25 mEq/L (ref 19–32)
Calcium: 8 mg/dL — ABNORMAL LOW (ref 8.4–10.5)
GFR calc non Af Amer: 46 mL/min — ABNORMAL LOW (ref >90)
Glucose, Bld: 174 mg/dL — ABNORMAL HIGH (ref 70–99)
Potassium: 4.3 mEq/L (ref 3.5–5.1)
Sodium: 131 mEq/L — ABNORMAL LOW (ref 135–145)
Total Protein: 5.3 g/dL — ABNORMAL LOW (ref 6.0–8.3)

## 2012-08-21 LAB — SALICYLATE LEVEL: Salicylate Lvl: <2 mg/dL — ABNORMAL LOW (ref 2.8–20.0)

## 2012-08-21 LAB — HEMOGLOBIN A1C: Mean Plasma Glucose: 137 mg/dL — ABNORMAL HIGH (ref <117)

## 2012-08-21 MED ORDER — DEXTROSE-NACL 5-0.9 % IV SOLN
INTRAVENOUS | Status: DC
  Administered 2012-08-21 (×2): via INTRAVENOUS

## 2012-08-21 MED ORDER — SODIUM CHLORIDE 0.9 % IV SOLN
INTRAVENOUS | Status: AC
  Administered 2012-08-21: 02:00:00 via INTRAVENOUS

## 2012-08-21 MED ORDER — LORAZEPAM 1 MG PO TABS
1.0000 mg | ORAL_TABLET | Freq: Four times a day (QID) | ORAL | Status: DC | PRN
Start: 2012-08-21 — End: 2012-08-22

## 2012-08-21 MED ORDER — ADULT MULTIVITAMIN W/MINERALS CH
1.0000 | ORAL_TABLET | Freq: Every day | ORAL | Status: DC
  Administered 2012-08-21 – 2012-08-22 (×2): 1 via ORAL
  Filled 2012-08-21 (×2): qty 1

## 2012-08-21 MED ORDER — VITAMIN B-1 100 MG PO TABS
100.0000 mg | ORAL_TABLET | Freq: Every day | ORAL | Status: DC
  Administered 2012-08-21 – 2012-08-22 (×2): 100 mg via ORAL
  Filled 2012-08-21 (×2): qty 1

## 2012-08-21 MED ORDER — POTASSIUM CHLORIDE CRYS ER 20 MEQ PO TBCR
40.0000 meq | EXTENDED_RELEASE_TABLET | Freq: Once | ORAL | Status: AC
  Administered 2012-08-21: 40 meq via ORAL
  Filled 2012-08-21: qty 2

## 2012-08-21 MED ORDER — K PHOS MONO-SOD PHOS DI & MONO 155-852-130 MG PO TABS
500.0000 mg | ORAL_TABLET | Freq: Three times a day (TID) | ORAL | Status: AC
  Administered 2012-08-21 (×3): 500 mg via ORAL
  Filled 2012-08-21 (×3): qty 2

## 2012-08-21 MED ORDER — FLUOXETINE HCL 20 MG PO CAPS
40.0000 mg | ORAL_CAPSULE | Freq: Every day | ORAL | Status: DC
  Administered 2012-08-21 – 2012-08-22 (×2): 40 mg via ORAL
  Filled 2012-08-21 (×2): qty 2

## 2012-08-21 MED ORDER — METOCLOPRAMIDE HCL 5 MG/ML IJ SOLN
10.0000 mg | Freq: Four times a day (QID) | INTRAMUSCULAR | Status: DC | PRN
  Filled 2012-08-21: qty 2

## 2012-08-21 MED ORDER — BIOTENE DRY MOUTH MT LIQD
15.0000 mL | Freq: Two times a day (BID) | OROMUCOSAL | Status: DC
  Administered 2012-08-21 – 2012-08-22 (×3): 15 mL via OROMUCOSAL

## 2012-08-21 MED ORDER — ACETAMINOPHEN 650 MG RE SUPP: 650.0000 mg | Freq: Four times a day (QID) | RECTAL | Status: DC | PRN

## 2012-08-21 MED ORDER — INSULIN ASPART 100 UNIT/ML ~~LOC~~ SOLN
0.0000 [IU] | Freq: Three times a day (TID) | SUBCUTANEOUS | Status: DC
  Administered 2012-08-21 (×3): 2 [IU] via SUBCUTANEOUS
  Administered 2012-08-22: 1 [IU] via SUBCUTANEOUS
  Administered 2012-08-22: 2 [IU] via SUBCUTANEOUS

## 2012-08-21 MED ORDER — ACETAMINOPHEN 325 MG PO TABS
650.0000 mg | ORAL_TABLET | Freq: Four times a day (QID) | ORAL | Status: DC | PRN
  Administered 2012-08-21 – 2012-08-22 (×2): 650 mg via ORAL
  Filled 2012-08-21 (×2): qty 2

## 2012-08-21 MED ORDER — ONDANSETRON HCL 4 MG/2ML IJ SOLN: 4.0000 mg | Freq: Three times a day (TID) | INTRAMUSCULAR | Status: DC | PRN

## 2012-08-21 MED ORDER — THIAMINE HCL 100 MG/ML IJ SOLN
100.0000 mg | Freq: Every day | INTRAMUSCULAR | Status: DC
  Filled 2012-08-21 (×2): qty 1

## 2012-08-21 MED ORDER — OXYBUTYNIN CHLORIDE ER 5 MG PO TB24
5.0000 mg | ORAL_TABLET | Freq: Every day | ORAL | Status: DC
  Administered 2012-08-21 – 2012-08-22 (×2): 5 mg via ORAL
  Filled 2012-08-21 (×2): qty 1

## 2012-08-21 MED ORDER — CHLORHEXIDINE GLUCONATE 0.12 % MT SOLN
15.0000 mL | Freq: Two times a day (BID) | OROMUCOSAL | Status: DC
  Administered 2012-08-21 – 2012-08-22 (×3): 15 mL via OROMUCOSAL
  Filled 2012-08-21 (×5): qty 15

## 2012-08-21 MED ORDER — SIMVASTATIN 20 MG PO TABS
20.0000 mg | ORAL_TABLET | Freq: Every evening | ORAL | Status: DC
  Filled 2012-08-21: qty 1

## 2012-08-21 MED ORDER — FOLIC ACID 1 MG PO TABS
1.0000 mg | ORAL_TABLET | Freq: Every day | ORAL | Status: DC
  Administered 2012-08-21 – 2012-08-22 (×2): 1 mg via ORAL
  Filled 2012-08-21 (×2): qty 1

## 2012-08-21 MED ORDER — LORAZEPAM 2 MG/ML IJ SOLN: 1.0000 mg | Freq: Four times a day (QID) | INTRAMUSCULAR | Status: DC | PRN

## 2012-08-21 MED ORDER — ENOXAPARIN SODIUM 40 MG/0.4ML ~~LOC~~ SOLN
40.0000 mg | SUBCUTANEOUS | Status: DC
  Administered 2012-08-21: 40 mg via SUBCUTANEOUS
  Filled 2012-08-21 (×2): qty 0.4

## 2012-08-21 MED ORDER — SODIUM CHLORIDE 0.9 % IJ SOLN
3.0000 mL | Freq: Two times a day (BID) | INTRAMUSCULAR | Status: DC
  Administered 2012-08-21: 3 mL via INTRAVENOUS

## 2012-08-21 MED ORDER — DEXTROSE-NACL 5-0.9 % IV SOLN
INTRAVENOUS | Status: DC
  Administered 2012-08-21: 05:00:00 via INTRAVENOUS

## 2012-08-21 NOTE — H&P (Signed)
INTERNAL MEDICINE TEACHING SERVICE Attending Admission Note  Date: 08/21/2012  Patient name: William [name]  Medical record number: [id number]  Date of birth: 1946/09/28    I have seen and evaluated William [name] and discussed their care with the Residency Team.  89 yr. Old male w/ pmhx significant for chronic alcohol abuse, hx alcohol withdrawal seizure, HL, HTN, Type 2 DM, presented due to alcohol intoxication.  He was brought in by EMS to [facility] after his was found by friends to be altered and lying in vomit and feces. Per history, he had been binge drinking for five days.  He stated he had drank approximately 2.5 gallons of vodka due to an argument with his girlfriend's daughter.  The history was obtained while he was intoxicated on admission. Vital signs were significant for a BP of 50/30 and laboratory studies for a EtOH level of 250 as well as a lactic acidosis of 7.8. He was also noted to have multiple electrolyte abnormalities which include a Na of 128, K of 2.8, Cl of 83. He has no hx of CKD and was noted to have a Cr of 2.07. He was initially treated with 3L of NS, 1 L LR, 100 mg thiamine, 40 meq KCL, 2 g MgS, and a 500cc D5 bolus. This morning his electrolytes have been noted to slowly correct with an improvement in his lactic acidosis.  He is noted to still have alcohol on his breath and only oriented to person, place, but not to situation or time. Physical Exam: Blood pressure 100/79, pulse 80, temperature 98.2 F (36.8 C), temperature source Oral, resp. rate 18, height 5\' 8"  (1.727 m), weight 179 lb 3.7 oz (81.3 kg), SpO2 99.00%.  General: Vital signs reviewed and noted. Alcohol on his breath. Oriented to person and place but not situation or time.  Head: Normocephalic, atraumatic.  Eyes: PERRL, EOMI, No signs of anemia or jaundince.  Nose: Mucous membranes moist, not inflammed, nonerythematous.  Throat: Oropharynx nonerythematous, no exudate appreciated.   Neck: No  deformities, masses, or tenderness noted.Supple, No carotid Bruits, no JVD.  Lungs:  Normal respiratory effort. Clear to auscultation BL without crackles or wheezes.  Heart: RRR. S1 and S2 normal without gallop, murmur, or rubs.  Abdomen:  BS normoactive. Soft, Nondistended, non-tender.  No masses or organomegaly.  Extremities: No pretibial edema.  Neurologic: A&O X3, CN II - XII are grossly intact. Motor strength is 5/5 in the all 4 extremities, Sensations intact to light touch, Cerebellar signs negative.  Skin: No visible rashes, scars.    Lab results: Results for orders placed during the hospital encounter of 08/20/12 (from the past 24 hour(s))  GLUCOSE, CAPILLARY     Status: Abnormal   Collection Time   08/20/12  7:40 PM      Component Value Range   Glucose-Capillary 297 (*) 70 - 99 mg/dL   Comment 1 Documented in Chart     Comment 2 Notify RN    CBC WITH DIFFERENTIAL     Status: Normal   Collection Time   08/20/12  7:50 PM      Component Value Range   WBC 5.6  4.0 - 10.5 K/uL   RBC 4.39  4.22 - 5.81 MIL/uL   Hemoglobin 14.3  13.0 - 17.0 g/dL   HCT 40.1  39.0 - 52.0 %   MCV 91.3  78.0 - 100.0 fL   MCH 32.6  26.0 - 34.0 pg   MCHC 35.7  30.0 - 36.0  g/dL   RDW 13.5  11.5 - 15.5 %   Platelets 160  150 - 400 K/uL   Neutrophils Relative 64  43 - 77 %   Neutro Abs 3.5  1.7 - 7.7 K/uL   Lymphocytes Relative 29  12 - 46 %   Lymphs Abs 1.6  0.7 - 4.0 K/uL   Monocytes Relative 7  3 - 12 %   Monocytes Absolute 0.4  0.1 - 1.0 K/uL   Eosinophils Relative 0  0 - 5 %   Eosinophils Absolute 0.0  0.0 - 0.7 K/uL   Basophils Relative 1  0 - 1 %   Basophils Absolute 0.0  0.0 - 0.1 K/uL  COMPREHENSIVE METABOLIC PANEL     Status: Abnormal   Collection Time   08/20/12  7:50 PM      Component Value Range   Sodium 128 (*) 135 - 145 mEq/L   Potassium 2.8 (*) 3.5 - 5.1 mEq/L   Chloride 83 (*) 96 - 112 mEq/L   CO2 19  19 - 32 mEq/L   Glucose, Bld 283 (*) 70 - 99 mg/dL   BUN 28 (*) 6 - 23  mg/dL   Creatinine, Ser 2.07 (*) 0.50 - 1.35 mg/dL   Calcium 8.9  8.4 - 10.5 mg/dL   Total Protein 6.6  6.0 - 8.3 g/dL   Albumin 3.2 (*) 3.5 - 5.2 g/dL   AST 39 (*) 0 - 37 U/L   ALT 19  0 - 53 U/L   Alkaline Phosphatase 76  39 - 117 U/L   Total Bilirubin 0.6  0.3 - 1.2 mg/dL   GFR calc non Af Amer 31 (*) >90 mL/min   GFR calc Af Amer 36 (*) >90 mL/min  ETHANOL     Status: Abnormal   Collection Time   08/20/12  7:50 PM      Component Value Range   Alcohol, Ethyl (B) 251 (*) 0 - 11 mg/dL  LACTIC ACID, PLASMA     Status: Abnormal   Collection Time   08/20/12  9:25 PM      Component Value Range   Lactic Acid, Venous 7.8 (*) 0.5 - 2.2 mmol/L  BLOOD GAS, VENOUS     Status: Abnormal   Collection Time   08/20/12  9:50 PM      Component Value Range   FIO2 0.21     pH, Ven 7.386 (*) 7.250 - 7.300   pCO2, Ven 37.1 (*) 45.0 - 50.0 mmHg   pO2, Ven 25.8 (*) 30.0 - 45.0 mmHg   Bicarbonate 21.7  20.0 - 24.0 mEq/L   TCO2 19.2  0 - 100 mmol/L   Acid-base deficit 2.3 (*) 0.0 - 2.0 mmol/L   O2 Saturation 42.9     Patient temperature 98.6     Collection site VEIN     Drawn by  Antonieta Pert, RN     Sample type VEIN    URINALYSIS, ROUTINE W REFLEX MICROSCOPIC     Status: Abnormal   Collection Time   08/20/12 11:48 PM      Component Value Range   Color, Urine YELLOW  YELLOW   APPearance CLEAR  CLEAR   Specific Gravity, Urine 1.016  1.005 - 1.030   pH 6.0  5.0 - 8.0   Glucose, UA >1000 (*) NEGATIVE mg/dL   Hgb urine dipstick MODERATE (*) NEGATIVE   Bilirubin Urine NEGATIVE  NEGATIVE   Ketones, ur NEGATIVE  NEGATIVE mg/dL   Protein, ur  30 (*) NEGATIVE mg/dL   Urobilinogen, UA 0.2  0.0 - 1.0 mg/dL   Nitrite NEGATIVE  NEGATIVE   Leukocytes, UA NEGATIVE  NEGATIVE  CREATININE, URINE, RANDOM     Status: Normal   Collection Time   08/20/12 11:48 PM      Component Value Range   Creatinine, Urine 79.0    SODIUM, URINE, RANDOM     Status: Normal   Collection Time   08/20/12 11:48 PM       Component Value Range   Sodium, Ur 52    URINE MICROSCOPIC-ADD ON     Status: Normal   Collection Time   08/20/12 11:48 PM      Component Value Range   Squamous Epithelial / LPF RARE  RARE   WBC, UA 0-2  <3 WBC/hpf   RBC / HPF 3-6  <3 RBC/hpf   Bacteria, UA RARE  RARE   Urine-Other MUCOUS PRESENT    TROPONIN I     Status: Normal   Collection Time   08/21/12 12:05 AM      Component Value Range   Troponin I <0.30  <0.30 ng/mL  GLUCOSE, CAPILLARY     Status: Abnormal   Collection Time   08/21/12  1:14 AM      Component Value Range   Glucose-Capillary 189 (*) 70 - 99 mg/dL   Comment 1 Notify RN    MAGNESIUM     Status: Normal   Collection Time   08/21/12  1:20 AM      Component Value Range   Magnesium 2.4  1.5 - 2.5 mg/dL  PHOSPHORUS     Status: Abnormal   Collection Time   08/21/12  1:20 AM      Component Value Range   Phosphorus 0.7 (*) 2.3 - 4.6 mg/dL  SALICYLATE LEVEL     Status: Abnormal   Collection Time   08/21/12  1:20 AM      Component Value Range   Salicylate Lvl 123456 (*) 2.8 - 20.0 mg/dL  OSMOLALITY     Status: Abnormal   Collection Time   08/21/12  1:20 AM      Component Value Range   Osmolality 315 (*) 275 - 300 mOsm/kg  BASIC METABOLIC PANEL     Status: Abnormal   Collection Time   08/21/12  1:20 AM      Component Value Range   Sodium 129 (*) 135 - 145 mEq/L   Potassium 3.7  3.5 - 5.1 mEq/L   Chloride 91 (*) 96 - 112 mEq/L   CO2 25  19 - 32 mEq/L   Glucose, Bld 247 (*) 70 - 99 mg/dL   BUN 26 (*) 6 - 23 mg/dL   Creatinine, Ser 1.83 (*) 0.50 - 1.35 mg/dL   Calcium 8.1 (*) 8.4 - 10.5 mg/dL   GFR calc non Af Amer 36 (*) >90 mL/min   GFR calc Af Amer 42 (*) >90 mL/min  LACTIC ACID, PLASMA     Status: Abnormal   Collection Time   08/21/12  2:42 AM      Component Value Range   Lactic Acid, Venous 4.3 (*) 0.5 - 2.2 mmol/L  COMPREHENSIVE METABOLIC PANEL     Status: Abnormal   Collection Time   08/21/12  7:02 AM      Component Value Range   Sodium 131  (*) 135 - 145 mEq/L   Potassium 4.3  3.5 - 5.1 mEq/L   Chloride 96  96 - 112  mEq/L   CO2 25  19 - 32 mEq/L   Glucose, Bld 174 (*) 70 - 99 mg/dL   BUN 24 (*) 6 - 23 mg/dL   Creatinine, Ser 1.49 (*) 0.50 - 1.35 mg/dL   Calcium 8.0 (*) 8.4 - 10.5 mg/dL   Total Protein 5.3 (*) 6.0 - 8.3 g/dL   Albumin 2.7 (*) 3.5 - 5.2 g/dL   AST 30  0 - 37 U/L   ALT 16  0 - 53 U/L   Alkaline Phosphatase 57  39 - 117 U/L   Total Bilirubin 0.7  0.3 - 1.2 mg/dL   GFR calc non Af Amer 46 (*) >90 mL/min   GFR calc Af Amer 54 (*) >90 mL/min  CBC WITH DIFFERENTIAL     Status: Abnormal   Collection Time   08/21/12  7:02 AM      Component Value Range   WBC 4.8  4.0 - 10.5 K/uL   RBC 3.39 (*) 4.22 - 5.81 MIL/uL   Hemoglobin 10.8 (*) 13.0 - 17.0 g/dL   HCT 31.1 (*) 39.0 - 52.0 %   MCV 91.7  78.0 - 100.0 fL   MCH 31.9  26.0 - 34.0 pg   MCHC 34.7  30.0 - 36.0 g/dL   RDW 13.6  11.5 - 15.5 %   Platelets 106 (*) 150 - 400 K/uL   Neutrophils Relative 61  43 - 77 %   Neutro Abs 2.9  1.7 - 7.7 K/uL   Lymphocytes Relative 31  12 - 46 %   Lymphs Abs 1.5  0.7 - 4.0 K/uL   Monocytes Relative 7  3 - 12 %   Monocytes Absolute 0.3  0.1 - 1.0 K/uL   Eosinophils Relative 1  0 - 5 %   Eosinophils Absolute 0.0  0.0 - 0.7 K/uL   Basophils Relative 1  0 - 1 %   Basophils Absolute 0.0  0.0 - 0.1 K/uL  PROTIME-INR     Status: Normal   Collection Time   08/21/12  7:02 AM      Component Value Range   Prothrombin Time 14.0  11.6 - 15.2 seconds   INR 1.09  0.00 - 1.49  CK     Status: Normal   Collection Time   08/21/12  7:02 AM      Component Value Range   Total CK 188  7 - 232 U/L  GLUCOSE, CAPILLARY     Status: Abnormal   Collection Time   08/21/12  8:18 AM      Component Value Range   Glucose-Capillary 171 (*) 70 - 99 mg/dL  PHOSPHORUS     Status: Abnormal   Collection Time   08/21/12 10:08 AM      Component Value Range   Phosphorus 1.2 (*) 2.3 - 4.6 mg/dL  BASIC METABOLIC PANEL     Status: Abnormal    Collection Time   08/21/12 10:08 AM      Component Value Range   Sodium 132 (*) 135 - 145 mEq/L   Potassium 4.1  3.5 - 5.1 mEq/L   Chloride 99  96 - 112 mEq/L   CO2 27  19 - 32 mEq/L   Glucose, Bld 125 (*) 70 - 99 mg/dL   BUN 24 (*) 6 - 23 mg/dL   Creatinine, Ser 1.41 (*) 0.50 - 1.35 mg/dL   Calcium 8.0 (*) 8.4 - 10.5 mg/dL   GFR calc non Af Amer 50 (*) >90 mL/min  GFR calc Af Amer 58 (*) >90 mL/min  MAGNESIUM     Status: Normal   Collection Time   08/21/12 10:08 AM      Component Value Range   Magnesium 2.0  1.5 - 2.5 mg/dL    Imaging results:  Dg Chest Port 1 View  08/20/2012  *RADIOLOGY REPORT*  Clinical Data: Alcohol intoxication.  Hypertension.  Diabetes.  PORTABLE CHEST - 1 VIEW  Comparison: 05/28/2012  Findings: Shallow inspiration with elevation of left hemidiaphragm. Normal heart size and pulmonary vascularity.  No focal airspace consolidation in the lungs.  No blunting of costophrenic angles. No pneumothorax.  Mediastinal contours appear intact.  Old bilateral rib fractures.  Degenerative changes in the shoulders. No significant change since previous study.  IMPRESSION: Stable chronic findings.  No evidence of active pulmonary disease.   Original Report Authenticated By: Lucienne Capers, M.D.      Assessment and Plan: I agree with the formulated Assessment and Plan with the following changes: 71 yr. Old male w/ pmhx significant for chronic alcohol abuse, hx alcohol withdrawal seizure, HL, HTN, Type 2 DM, presented due to alcohol intoxication. 1) Alcohol intoxication/alcoholic ketoacidosis/hypovolemic shock: Monitor for signs of withdrawal given his history. At this time, continue treatment with IVF (D5NS), thiamine, and closely watch all electrolytes (including K, Mg, Phos, Ca) and replace as necessary.His BP is improving. Agree with CIWA protocol. His lactic acidosis is improving. 2) AKI: This is likely secondary to volume depletion and resulting ATN from hypotension. Watch  UOP. Expect Cr to plateau before improving to baseline. 3) HTN: Would not initiate any agent at this time. 4) Type 2 DM: Novolog with meals, watch Glu closely at least every 8 hours. 5) PT/OT. Expect D/C in AM if medically stable. 6) rest per resident note.  Dominic Pea, DO 11/23/201312:20 PM

## 2012-08-21 NOTE — Progress Notes (Signed)
INITIAL ADULT NUTRITION ASSESSMENT Date: 08/21/2012   Time: 10:13 AM Reason for Assessment: MST   INTERVENTION: Supplement diet once advanced Pt is at risk for refeeding syndrome given his high alcohol consumption and very poor intake once diet started.    DOCUMENTATION CODES Per approved criteria  -Not Applicable    ASSESSMENT: Male 66 y.o.  Dx: High anion gap metabolic acidosis  Hx:  Past Medical History  Diagnosis Date  . Diabetes mellitus type II   . Hyperlipemia   . Anxiety   . Alcohol abuse   . Hearing impairment   . HTN (hypertension)   . Cataract   . Alcohol withdrawal seizure   History reviewed. No pertinent past surgical history.  Related Meds:  Scheduled Meds:   . sodium chloride   Intravenous STAT  . antiseptic oral rinse  15 mL Mouth Rinse q12n4p  . chlorhexidine  15 mL Mouth Rinse BID  . [COMPLETED] dextrose  500 mL Intravenous Once  . enoxaparin (LOVENOX) injection  40 mg Subcutaneous Q24H  . FLUoxetine  40 mg Oral Daily  . folic acid  1 mg Oral Daily  . insulin aspart  0-9 Units Subcutaneous TID WC  . [COMPLETED] lactated ringers  1,000 mL Intravenous Once  . [COMPLETED] magnesium sulfate 1 - 4 g bolus IVPB  2 g Intravenous Once  . multivitamin with minerals  1 tablet Oral Daily  . oxybutynin  5 mg Oral Daily  . phosphorus  500 mg Oral TID  . [COMPLETED] potassium chloride  40 mEq Oral Once  . [COMPLETED] potassium chloride  40 mEq Oral Once  . [COMPLETED] sodium chloride  1,000 mL Intravenous Once  . [COMPLETED] sodium chloride  2,000 mL Intravenous Once  . sodium chloride  3 mL Intravenous Q12H  . thiamine  100 mg Oral Daily   Or  . thiamine  100 mg Intravenous Daily  . [DISCONTINUED] simvastatin  20 mg Oral QPM  . [DISCONTINUED] thiamine  100 mg Intravenous Daily   Continuous Infusions:   . dextrose 5 % and 0.9% NaCl 200 mL/hr at 08/21/12 0955  . [DISCONTINUED] dextrose 5 % and 0.9% NaCl 200 mL/hr at 08/21/12 0448   PRN  Meds:.acetaminophen, acetaminophen, LORazepam, LORazepam, metoCLOPramide (REGLAN) injection, [DISCONTINUED] ondansetron (ZOFRAN) IV   Ht: 5\' 8"  (172.7 cm)  Wt: 179 lb 3.7 oz (81.3 kg)  Ideal Wt: 70 kg % Ideal Wt: 116%  Usual Wt:  Wt Readings from Last 10 Encounters:  08/21/12 179 lb 3.7 oz (81.3 kg)  06/04/12 186 lb 14.4 oz (84.777 kg)  05/30/12 185 lb 3 oz (84 kg)  02/19/12 194 lb 1.6 oz (88.043 kg)  11/11/11 189 lb 11.2 oz (86.047 kg)  03/05/10 215 lb 8 oz (97.75 kg)  12/03/09 214 lb 6.4 oz (97.251 kg)  06/14/09 207 lb 14.4 oz (94.303 kg)  05/31/09 201 lb 3.2 oz (91.264 kg)  05/17/09 198 lb (89.812 kg)   % Usual Wt: 96%  Body mass index is 27.25 kg/(m^2). Overweight  Food/Nutrition Related Hx: weight loss due to etoh abuse  Labs:  CMP     Component Value Date/Time   NA 131* 08/21/2012 0702   K 4.3 08/21/2012 0702   CL 96 08/21/2012 0702   CO2 25 08/21/2012 0702   GLUCOSE 174* 08/21/2012 0702   BUN 24* 08/21/2012 0702   CREATININE 1.49* 08/21/2012 0702   CREATININE 1.28 06/04/2012 1630   CALCIUM 8.0* 08/21/2012 0702   PROT 5.3* 08/21/2012 0702   ALBUMIN 2.7*  08/21/2012 0702   AST 30 08/21/2012 0702   ALT 16 08/21/2012 0702   ALKPHOS 57 08/21/2012 0702   BILITOT 0.7 08/21/2012 0702   GFRNONAA 46* 08/21/2012 0702   GFRAA 54* 08/21/2012 0702   CBG (last 3)   Basename 08/21/12 0818 08/21/12 0114 08/20/12 1940  GLUCAP 171* 189* 297*   Alcohol: 251 11/22 Sodium  Date/Time Value Range Status  08/21/2012  7:02 AM 131* 135 - 145 mEq/L Final  08/21/2012  1:20 AM 129* 135 - 145 mEq/L Final  08/20/2012  7:50 PM 128* 135 - 145 mEq/L Final    Potassium  Date/Time Value Range Status  08/21/2012  7:02 AM 4.3  3.5 - 5.1 mEq/L Final  08/21/2012  1:20 AM 3.7  3.5 - 5.1 mEq/L Final     RESULT REPEATED AND VERIFIED     DELTA CHECK NOTED  08/20/2012  7:50 PM 2.8* 3.5 - 5.1 mEq/L Final    Phosphorus  Date/Time Value Range Status  08/21/2012  1:20 AM 0.7* 2.3 - 4.6  mg/dL Final     REPEATED TO VERIFY     CRITICAL RESULT CALLED TO, READ BACK BY AND VERIFIED WITH:     L ADKINS AT 0233 ON 11.23.2013 BY NBROOKS  05/29/2012  3:45 AM 3.1  2.3 - 4.6 mg/dL Final  03/18/2011  5:30 AM 3.3  2.3 - 4.6 mg/dL Final    Magnesium  Date/Time Value Range Status  08/21/2012  1:20 AM 2.4  1.5 - 2.5 mg/dL Final  05/30/2012  5:05 AM 1.5  1.5 - 2.5 mg/dL Final  05/29/2012  3:45 AM 1.6  1.5 - 2.5 mg/dL Final     Intake/Output Summary (Last 24 hours) at 08/21/12 1016 Last data filed at 08/21/12 0845  Gross per 24 hour  Intake    790 ml  Output    200 ml  Net    590 ml    Diet Order: NPO  Supplements/Tube Feeding: none  IVF:    dextrose 5 % and 0.9% NaCl Last Rate: 200 mL/hr at 08/21/12 0955  [DISCONTINUED] dextrose 5 % and 0.9% NaCl Last Rate: 200 mL/hr at 08/21/12 0448   Pt with hx of chronic alcohol abuse. Pt admitted after being found by friends lying in vomit and feces after binge drinking for 5 days. Normal alcohol use is 1/5th of Vodka per day. Over the last 5 days pt has drank 2.5 gallons of vodka.  Pt is at high risk for refeeding syndrome given very poor intake. Per pt he has lost 4% of his body weight in the last few weeks due to excessive alcohol consumption. Pt has not been eating much, usually Breakfast and maybe a snack, drinks all day.   Estimated Nutritional Needs:   Kcal:  2000-2200 Protein:  90-100 grams Fluid:  >2 L/day  NUTRITION DIAGNOSIS: Inadequate oral intake related to inability to eat as evidenced by NPO status.   MONITORING/EVALUATION(Goals): Goal: Pt to meet >/= 90% of their estimated nutrition needs. Monitor: Diet advancement, PO intake, labs, weight  EDUCATION NEEDS: -No education needs identified at this time  [place], LDN, [place] Weekend/After Hours Pager  08/21/2012, 10:13 AM

## 2012-08-21 NOTE — ED Notes (Signed)
[facility] here to transport pt 

## 2012-08-21 NOTE — ED Notes (Signed)
Pt transferred to [place] [facility] via [facility] 

## 2012-08-21 NOTE — ED Provider Notes (Signed)
History     CSN: [id number]  Arrival date & time 08/20/12  1919   First MD Initiated Contact with Patient 08/20/12 2059      Chief Complaint  Patient presents with  . Alcohol Intoxication    (Consider location/radiation/quality/duration/timing/severity/associated sxs/prior treatment) HPI Comments: LEVEL 5 CAVEAT FOR INTOXICATION/AMS  Pt comes in with cc of etoh withdrawal. Pt moved to the resuscitation bay as his BP was 50/30 at arrival. Per EMS, neighbors called EMS as patient was found down, intoxicated. Pt had admitted to drinking vodka for the past 5 days. He is intoxicated, but alert, and responds to some of the questions appropriately. He has no n/v/f/c/abd pain/chest pain/sob.  Patient is a 66 y.o. male presenting with intoxication. The history is provided by the patient and medical records.  Alcohol Intoxication    Past Medical History  Diagnosis Date  . Diabetes mellitus type II   . Hyperlipemia   . Anxiety   . Alcohol abuse   . Hearing impairment   . HTN (hypertension)   . Cataract   . Alcohol withdrawal seizure     No past surgical history on file.  No family history on file.  History  Substance Use Topics  . Smoking status: Current Every Day Smoker    Types: Cigarettes  . Smokeless tobacco: Current User    Types: Chew  . Alcohol Use: Yes      Review of Systems  Unable to perform ROS: Mental status change    Allergies  Review of patient's allergies indicates no known allergies.  Home Medications   Current Outpatient Rx  Name  Route  Sig  Dispense  Refill  . FLUOXETINE HCL 40 MG PO CAPS   Oral   Take 40 mg by mouth daily.         Marland Kitchen HYDROCHLOROTHIAZIDE 25 MG PO TABS      TAKE 1 TABLET BY MOUTH DAILY   30 tablet   6   . IBUPROFEN 200 MG PO TABS   Oral   Take 200-800 mg by mouth every 6 (six) hours as needed. For pain         . LOPERAMIDE HCL 2 MG PO CAPS   Oral   Take 2 mg by mouth 4 (four) times daily as needed. For  diarrhea         . OXYBUTYNIN CHLORIDE ER 5 MG PO TB24   Oral   Take 5 mg by mouth daily.         Marland Kitchen SIMVASTATIN 20 MG PO TABS   Oral   Take 1 tablet (20 mg total) by mouth every evening.   30 tablet   11     BP 87/58  Pulse 111  Temp 98 F (36.7 C) (Oral)  Resp 23  SpO2 94%  Physical Exam  Constitutional: He is oriented to person, place, and time. He appears well-developed.  HENT:  Head: Normocephalic and atraumatic.  Eyes: Conjunctivae normal and EOM are normal. Pupils are equal, round, and reactive to light.  Neck: Normal range of motion. Neck supple.  Cardiovascular: Normal rate and regular rhythm.   Pulmonary/Chest: Effort normal and breath sounds normal.  Abdominal: Soft. Bowel sounds are normal. He exhibits no distension. There is no tenderness. There is no rebound and no guarding.  Neurological: He is alert and oriented to person, place, and time.  Skin: Skin is warm.       Multiple erythematous lesions - dont appear to be infected.  ED Course  [place] bedside Date/Time: 08/21/2012 1:33 AM Performed by: Varney Biles Authorized by: Varney Biles Consent: Verbal consent obtained. Risks and benefits: risks, benefits and alternatives were discussed Consent given by: patient Patient identity confirmed: verbally with patient Local anesthesia used: no Patient sedated: no Comments: US aorta - no AAA seen [place] Cardiac echo - Show no pericardial effusion - FAST exam   (including critical care time)  Labs Reviewed  GLUCOSE, CAPILLARY - Abnormal; Notable for the following:    Glucose-Capillary 297 (*)     All other components within normal limits  COMPREHENSIVE METABOLIC PANEL - Abnormal; Notable for the following:    Sodium 128 (*)     Potassium 2.8 (*)     Chloride 83 (*)     Glucose, Bld 283 (*)     BUN 28 (*)     Creatinine, Ser 2.07 (*)     Albumin 3.2 (*)     AST 39 (*)     GFR calc non Af Amer 31 (*)     GFR calc Af Amer 36 (*)     All other  components within normal limits  ETHANOL - Abnormal; Notable for the following:    Alcohol, Ethyl (B) 251 (*)     All other components within normal limits  LACTIC ACID, PLASMA - Abnormal; Notable for the following:    Lactic Acid, Venous 7.8 (*)     All other components within normal limits  URINALYSIS, ROUTINE W REFLEX MICROSCOPIC - Abnormal; Notable for the following:    Glucose, UA >1000 (*)     Hgb urine dipstick MODERATE (*)     Protein, ur 30 (*)     All other components within normal limits  BLOOD GAS, VENOUS - Abnormal; Notable for the following:    pH, Ven 7.386 (*)     pCO2, Ven 37.1 (*)     pO2, Ven 25.8 (*)     Acid-base deficit 2.3 (*)     All other components within normal limits  GLUCOSE, CAPILLARY - Abnormal; Notable for the following:    Glucose-Capillary 189 (*)     All other components within normal limits  CBC WITH DIFFERENTIAL  TROPONIN I  URINE MICROSCOPIC-ADD ON  CULTURE, BLOOD (ROUTINE X 2)  CULTURE, BLOOD (ROUTINE X 2)  CREATININE, URINE, RANDOM  SODIUM, URINE, RANDOM  LACTIC ACID, PLASMA  MAGNESIUM  PHOSPHORUS  SALICYLATE LEVEL  OSMOLALITY  OSMOLALITY, URINE  BASIC METABOLIC PANEL   Dg Chest Port 1 View  08/20/2012  *RADIOLOGY REPORT*  Clinical Data: Alcohol intoxication.  Hypertension.  Diabetes.  PORTABLE CHEST - 1 VIEW  Comparison: 05/28/2012  Findings: Shallow inspiration with elevation of left hemidiaphragm. Normal heart size and pulmonary vascularity.  No focal airspace consolidation in the lungs.  No blunting of costophrenic angles. No pneumothorax.  Mediastinal contours appear intact.  Old bilateral rib fractures.  Degenerative changes in the shoulders. No significant change since previous study.  IMPRESSION: Stable chronic findings.  No evidence of active pulmonary disease.   Original Report Authenticated By: Lucienne Capers, M.D.      1. DKA (diabetic ketoacidoses)   2. Lactic acidosis   3. Alcohol abuse       MDM  DDx: Sepsis  syndrome ACS syndrome DKA ICH Stroke CHF exacerbation COPD exacerbation Infection - pneumonia/UTI/Cellulitis PE Dehydration Electrolyte abnormality Tox syndrome  Pt comes in with cc of alcohol withdrawal, noted to be hypotensive. With the intoxication, getting an objective way of checking  mentation is difficult..... But it appears that patient is perfusing the brain well. Bedside sonography were within normal limits. Initial impression is that patient has hypovolemic shock and dehydration and alcoholic ketosis. Will get sepsis workup initiated, resuscitate with fluids and reassess for central line.   Date: 08/21/2012  Rate: 71  Rhythm: normal sinus rhythm  QRS Axis: normal  Intervals: normal  ST/T Wave abnormalities: normal  Conduction Disutrbances: none  Narrative Interpretation: unremarkable   1:38 AM Pt has profoundly elevated lactate, but his BP has improved singinificantly. He has several elyte abnormality as well. Will need admission. IM teaching service brought up DKA on the ddx, which is possible with the elevated AG. With hypokalemia, we have given 73 of Kdur along with Mag. No insulin for now. Admitting to [facility].  CRITICAL CARE Performed by: Varney Biles   Total critical care time: 60 minutes  Critical care time was exclusive of separately billable procedures and treating other patients.  Critical care was necessary to treat or prevent imminent or life-threatening deterioration.  Critical care was time spent personally by me on the following activities: development of treatment plan with patient and/or surrogate as well as nursing, discussions with consultants, evaluation of patient's response to treatment, examination of patient, obtaining history from patient or surrogate, ordering and performing treatments and interventions, ordering and review of laboratory studies, ordering and review of radiographic studies, pulse oximetry and re-evaluation of  patient's condition.            Varney Biles, MD 08/21/12 0140

## 2012-08-21 NOTE — H&P (Signed)
Hospital Admission Note Date: 08/21/2012  Patient name: William [name] Medical record number: [id number] Date of birth: 1945-10-11 Age: 66 y.o. Gender: male PCP: Vertell Novak, MD  Medical Service: Internal Medicine Teaching Service--[facility]  Attending physician:  Dr. Dominic Pea    1st Contact: Dr. Wayland Salinas 2nd Contact: Dr. Michail Sermon    [phone] After 5 pm or weekends: 1st Contact:      Pager: [phone] 2nd Contact:      Pager: [phone]  Chief Complaint: Alcohol intoxication  History of Present Illness: Mr. Caraker is a 66 year old white male with PMH of DM II, HL, chronic alcohol abuse, and HTN presenting to [facility] from [place] after being brought by EMS when he was found by friends to be lying in vomit and feces after binge drinking x5 days.  Mr. Fidalgo claims he normally drinks 1/5th of Vodka a day but over the past 5 days he has drank approximately 2.5 gallons of vodka because his girlfriend's daughter was getting on his nerves.  He admits to vomiting while drinking but denies any blood.  He denies feeling drunk at this time but has some nausea.  He also denies any fever, chills, headaches, dizziness, chest pain, shortness of breath, abdominal pain, diarrhea, constipation, or any urinary complaints at this time.    Of note, he was found to have BP 50/30 on arrival to [facility] and intoxicated with EtOH level of 0.25. His lactic acid level was 7.8 and electrolyte abnormalities: hyponatremia:128, hypokalemia:2.8, and hypochloremia:83, with Cr 2.07.  He subsequently received: Bolus of 3L NS, 1L LR, 500cc D5 bolus, KDUR 9meq PO x2, IV Mg 2g x1, and 100mg  Thiamine.     Meds: Medications Prior to Admission  Medication Sig Dispense Refill  . FLUoxetine (PROZAC) 40 MG capsule Take 40 mg by mouth daily.      . hydrochlorothiazide (HYDRODIURIL) 25 MG tablet TAKE 1 TABLET BY MOUTH DAILY  30 tablet  6  . ibuprofen (ADVIL,MOTRIN) 200 MG tablet Take 200-800 mg by  mouth every 6 (six) hours as needed. For pain      . loperamide (IMODIUM) 2 MG capsule Take 2 mg by mouth 4 (four) times daily as needed. For diarrhea      . oxybutynin (DITROPAN-XL) 5 MG 24 hr tablet Take 5 mg by mouth daily.      . simvastatin (ZOCOR) 20 MG tablet Take 1 tablet (20 mg total) by mouth every evening.  30 tablet  11   Allergies: Allergies as of 08/20/2012  . (No Known Allergies)   Past Medical History  Diagnosis Date  . Diabetes mellitus type II   . Hyperlipemia   . Anxiety   . Alcohol abuse   . Hearing impairment   . HTN (hypertension)   . Cataract   . Alcohol withdrawal seizure    History reviewed. No pertinent past surgical history. History reviewed. No pertinent family history. History   Social History  . Marital Status: Single    Spouse Name: N/A    Number of Children: N/A  . Years of Education: N/A   Occupational History  . Not on file.   Social History Main Topics  . Smoking status: Current Every Day Smoker    Types: Cigarettes  . Smokeless tobacco: Current User    Types: Chew  . Alcohol Use: Yes     Comment: vodka  . Drug Use: No  . Sexually Active: No   Other Topics Concern  .  Not on file   Social History Narrative  . No narrative on file   Review of Systems: Pertinent items are noted in HPI.  Physical Exam: Blood pressure 97/59, pulse 99, temperature 98.5 F (36.9 C), temperature source Oral, resp. rate 20, weight 179 lb 3.7 oz (81.3 kg), SpO2 97.00%. Vitals reviewed. General: resting in bed, NAD, very hard on hearing HEENT: PERRLA, EOMI, no scleral icterus Cardiac: tachycardia, no rubs, murmurs or gallops Pulm: clear to auscultation bilaterally, no wheezes, rales, or rhonchi Abd: soft, nontender, nondistended, BS present Ext: warm and well perfused, no pedal edema Neuro: alert and oriented X3, cranial nerves II-XII grossly intact, strength and sensation to light touch equal in bilateral upper and lower extremities  Lab  results: Basic Metabolic Panel:  Basename 08/21/12 0120 08/20/12 1950  NA 129* 128*  K 3.7 2.8*  CL 91* 83*  CO2 25 19  GLUCOSE 247* 283*  BUN 26* 28*  CREATININE 1.83* 2.07*  CALCIUM 8.1* 8.9  MG 2.4 --  PHOS 0.7* --   Liver Function Tests:  Basename 08/20/12 1950  AST 39*  ALT 19  ALKPHOS 76  BILITOT 0.6  PROT 6.6  ALBUMIN 3.2*   CBC:  Basename 08/20/12 1950  WBC 5.6  NEUTROABS 3.5  HGB 14.3  HCT 40.1  MCV 91.3  PLT 160   Cardiac Enzymes:  Basename 08/21/12 0005  CKTOTAL --  CKMB --  CKMBINDEX --  TROPONINI <0.30   CBG:  Basename 08/21/12 0114 08/20/12 1940  GLUCAP 189* 297*   Urine Drug Screen: Drugs of Abuse     Component Value Date/Time   LABOPIA NONE DETECTED 05/28/2012 0944   COCAINSCRNUR NONE DETECTED 05/28/2012 0944   LABBENZ POSITIVE* 05/28/2012 0944   AMPHETMU NONE DETECTED 05/28/2012 0944   THCU NONE DETECTED 05/28/2012 0944   LABBARB NONE DETECTED 05/28/2012 0944    Alcohol Level:  Basename 08/20/12 1950  ETH 251*   Urinalysis:  Basename 08/20/12 2348  COLORURINE YELLOW  LABSPEC 1.016  PHURINE 6.0  GLUCOSEU >1000*  HGBUR MODERATE*  BILIRUBINUR NEGATIVE  KETONESUR NEGATIVE  PROTEINUR 30*  UROBILINOGEN 0.2  NITRITE NEGATIVE  LEUKOCYTESUR NEGATIVE   Imaging results:  Dg Chest Port 1 View  08/20/2012  *RADIOLOGY REPORT*  Clinical Data: Alcohol intoxication.  Hypertension.  Diabetes.  PORTABLE CHEST - 1 VIEW  Comparison: 05/28/2012  Findings: Shallow inspiration with elevation of left hemidiaphragm. Normal heart size and pulmonary vascularity.  No focal airspace consolidation in the lungs.  No blunting of costophrenic angles. No pneumothorax.  Mediastinal contours appear intact.  Old bilateral rib fractures.  Degenerative changes in the shoulders. No significant change since previous study.  IMPRESSION: Stable chronic findings.  No evidence of active pulmonary disease.   Original Report Authenticated By: Lucienne Capers, M.D.     Other results: EKG: 104bpm, sinus tachycardia, multiple PVCs, qtc 479  Assessment & Plan by Problem: Mr. Yebra is a 66 year old male with PMH of DM II, chronic alcohol abuse, and HTN admitted for anion gap metabolic acidosis.  High anion gap metabolic acidosis and metabolic alkalosis --likely secondary to alcoholic ketoacidosis with component of starvation ketoacidosis in the setting of profuse vomiting and malnutrition secondary to chronic alcohol abuse. Presented with 5 day alcohol binge with self reported 2.5 gallons of vodka, EtOH level 251.  AG of 26-->13, HCO3-( 19-->25), lactic acid 7.8-->4.3, hyponatremia (128-->129), hypokalemia (2.8-->3.7), and hypochloremia (83-->91) after receiving bolus of 3L NS, 1L LR, 500cc D5 bolus, KDUR 8meq PO x2,  IV Mg 2g x1, and 100mg  Thiamin in ED.  Consideration can be given to possible DKA as cause, however may be less likely, glucose on admission 283, Hb A1c 5.8 (04/2012), U/A >1000 glucose, negative ketones, moderate Hg with RBC 3-6, and not on any medication at home for diabetes.  Other causes of metabolic acidosis will also be explored including salicylates, ethylene glycol, and methanol.  Trop x1 negative.  -admit to tele -IVF: D5-NS @200cc /hr -AM LABS: CBC, CMP, CK -serial BMETs -CIWA -Thiamine -folic acid -MV -salicylate level -serum osmolarity -I/Os -Tylenol PRN pain -Zofran PRN nausea -neuro checks -AM EKG--monitor qtc  Disorders of fluid, electrolyte, and acid-base balance--improving with fluids and correction.  Presented with  hyponatremia (128-->129), hypokalemia (2.8-->3.7), and hypochloremia (83-->91), phosphorus 0.7, Magnesium 2.4.  Given bolus of 3L NS, 1L LR, 500cc D5 bolus, KDUR 22meq PO x2, IV Mg 2g x1, and 100mg  Thiamin in ED.  AG 13 at time of admission.  HCO3- 25.   -Kphos 500mg  TID -f/u bmet, Mg, phos -AM CMET -IVF--D5 NS  Acute Renal Failure--in the setting high anion gap metabolic acidosis and alkalosis.  Likely  secondary to volume depletion given chronic vomiting and malnutrition.  Cr 2.07 on admission-->1.83 after fluids and severely hypotensive on admission 50/30.  Hx of chronic alcohol abuse.  Concern for ATN and hypovolemic shock given hypotensive blood pressure, tachycardia, and organ dysfunction with lactic acid 7.8-->4.3 at time of ED arrival.  Replenished with fluid bolus of NS, LR, and D5 in ED.  BP at time of admission: 97/59.  Concern also for possible rhabdomyolysis given electrolyte disturbances including  Hypophosphatemia and hypokaemia, muscle breakdown from immobilization, and hx of alcoholism and current binge drinking episode.  U/A: + moderate Hgb with 3-6 RBC.   -f/u BMETs -AM CMET, CK -IVF -continue to monitor  Hypotension--BP on arrival 50/30, bolus of fluids with NS, LR, and D5 given in ED.  BP on admission 97/59.  Concern for initial hypovolemic shock given severe hypotension and renal dysfunction.   -IVF--D5 NS @200cc /hr -continue to monitor -hold HCTZ  ALCOHOL ABUSE--20+ year drinking hx ~1/5th vodka daily.   Hx of alcohol withdrawal seizure.  EtOH level 251 on admission.  Drank approximately 2.5 gallons of vodka over past 5 days.  Found lying on the floor in vomit and feces.  -cessation strongly advised -CIWA -neuro checks -Thiamine, folic acid, MV -IVF  DIABETES MELLITUS, TYPE II--stable.  Hb A1c 04/2012: 5.8.  CBG on admission: 189 -CBG monitoring with meals -SSI -HbA1c -continue to monitor  HYPERTENSION--on home regimen: HCTZ 25mg  QD -Hold HCTZ in setting of hypotension -continue to monitor  Diet: NPO DVT Ppx: Lovenox Dispo: Disposition is deferred at this time, awaiting improvement of current medical problems. Anticipated discharge in approximately 1-2 day(s).   The patient does have a current PCP Doug Sou, ELIZABETH, MD), therefore will be requiring OPC follow-up after discharge.   The patient does not have transportation limitations that hinder transportation  to clinic appointments.  SignedJerene Pitch 08/21/2012, 4:10 AM

## 2012-08-22 DIAGNOSIS — F10129 Alcohol abuse with intoxication, unspecified: Secondary | ICD-10-CM

## 2012-08-22 DIAGNOSIS — E872 Acidosis: Secondary | ICD-10-CM | POA: Diagnosis present

## 2012-08-22 LAB — BASIC METABOLIC PANEL
BUN: 9 mg/dL (ref 6–23)
Creatinine, Ser: 0.96 mg/dL (ref 0.50–1.35)
GFR calc non Af Amer: 83 mL/min — ABNORMAL LOW (ref >90)
Glucose, Bld: 182 mg/dL — ABNORMAL HIGH (ref 70–99)
Potassium: 2.9 mEq/L — ABNORMAL LOW (ref 3.5–5.1)

## 2012-08-22 LAB — GLUCOSE, CAPILLARY
Glucose-Capillary: 155 mg/dL — ABNORMAL HIGH (ref 70–99)
Glucose-Capillary: 147 mg/dL — ABNORMAL HIGH (ref 70–99)

## 2012-08-22 MED ORDER — POTASSIUM CHLORIDE CRYS ER 20 MEQ PO TBCR: 40.0000 meq | EXTENDED_RELEASE_TABLET | Freq: Once | ORAL | Status: DC

## 2012-08-22 MED ORDER — ADULT MULTIVITAMIN W/MINERALS CH: 1.0000 | ORAL_TABLET | Freq: Every day | ORAL | Status: DC

## 2012-08-22 MED ORDER — THIAMINE HCL 100 MG PO TABS: 100.0000 mg | ORAL_TABLET | Freq: Every day | ORAL | Status: DC

## 2012-08-22 MED ORDER — K PHOS MONO-SOD PHOS DI & MONO 155-852-130 MG PO TABS
500.0000 mg | ORAL_TABLET | Freq: Once | ORAL | Status: AC
  Administered 2012-08-22: 500 mg via ORAL
  Filled 2012-08-22: qty 2

## 2012-08-22 MED ORDER — FOLIC ACID 1 MG PO TABS: 1.0000 mg | ORAL_TABLET | Freq: Every day | ORAL | Status: DC

## 2012-08-22 NOTE — Progress Notes (Signed)
08/22/12 Patient to be sent home from hospital today. IV site removed by patient while in bed. Discharge instructions reviewed with patient. Friend  States she maybe able come pick him up today.

## 2012-08-22 NOTE — Progress Notes (Signed)
Subjective: Pt with some mild diarrhea that occurs when urinating. Overall, the pt states that he is feeling much better today.  Objective: Vital signs in last 24 hours: Filed Vitals:   08/21/12 0845 08/21/12 1059 08/21/12 2149 08/22/12 0335  BP: 102/69 100/79 148/84 164/75  Pulse: 86 80 75 77  Temp:  98.2 F (36.8 C) 98.3 F (36.8 C) 98.6 F (37 C)  TempSrc:  Oral Oral Axillary  Resp:  18 18 18   Height:      Weight:      SpO2:  99% 100% 98%   Weight change:   Intake/Output Summary (Last 24 hours) at 08/22/12 0748 Last data filed at 08/22/12 0600  Gross per 24 hour  Intake 4806.67 ml  Output    100 ml  Net 4706.67 ml   Vitals reviewed. General: Sleeping in bed, NAD Cardiac: Regular rate Pulm: Resp equal.  Abd: soft, nontender, nondistended Ext: warm and well perfused, no pedal edema Neuro: alert and oriented X3,   Lab Results: Basic Metabolic Panel:  Lab [id number] 08/21/12 0702 08/21/12 0120  NA 132* 131* --  K 4.1 4.3 --  CL 99 96 --  CO2 27 25 --  GLUCOSE 125* 174* --  BUN 24* 24* --  CREATININE 1.41* 1.49* --  CALCIUM 8.0* 8.0* --  MG 2.0 -- 2.4  PHOS 1.2* -- 0.7*   Liver Function Tests:  Lab 08/21/12 0702 08/20/12 1950  AST 30 39*  ALT 16 19  ALKPHOS 57 76  BILITOT 0.7 0.6  PROT 5.3* 6.6  ALBUMIN 2.7* 3.2*   CBC:  Lab 08/21/12 0702 08/20/12 1950  WBC 4.8 5.6  NEUTROABS 2.9 3.5  HGB 10.8* 14.3  HCT 31.1* 40.1  MCV 91.7 91.3  PLT 106* 160   Cardiac Enzymes:  Lab 08/21/12 0702 08/21/12 0005  CKTOTAL 188 --  CKMB -- --  CKMBINDEX -- --  TROPONINI -- <0.30   CBG:  Lab 08/21/12 2225 08/21/12 1705 08/21/12 1231 08/21/12 0818 08/21/12 0114 08/20/12 1940  GLUCAP 112* 153* 152* 171* 189* 297*   Hemoglobin A1C:  Lab 08/21/12 0702  HGBA1C 6.4*   Coagulation:  Lab 08/21/12 0702  LABPROT 14.0  INR 1.09   Urine Drug Screen: Drugs of Abuse     Component Value Date/Time   LABOPIA NONE DETECTED 05/28/2012 0944   COCAINSCRNUR  NONE DETECTED 05/28/2012 0944   LABBENZ POSITIVE* 05/28/2012 0944   AMPHETMU NONE DETECTED 05/28/2012 0944   THCU NONE DETECTED 05/28/2012 0944   LABBARB NONE DETECTED 05/28/2012 0944    Alcohol Level:  Lab 08/20/12 1950  ETH 251*   Urinalysis:  Lab 08/20/12 2348  COLORURINE YELLOW  LABSPEC 1.016  PHURINE 6.0  GLUCOSEU >1000*  HGBUR MODERATE*  BILIRUBINUR NEGATIVE  KETONESUR NEGATIVE  PROTEINUR 30*  UROBILINOGEN 0.2  NITRITE NEGATIVE  LEUKOCYTESUR NEGATIVE    Studies/Results: Dg Chest Port 1 View  08/20/2012  *RADIOLOGY REPORT*  Clinical Data: Alcohol intoxication.  Hypertension.  Diabetes.  PORTABLE CHEST - 1 VIEW  Comparison: 05/28/2012  Findings: Shallow inspiration with elevation of left hemidiaphragm. Normal heart size and pulmonary vascularity.  No focal airspace consolidation in the lungs.  No blunting of costophrenic angles. No pneumothorax.  Mediastinal contours appear intact.  Old bilateral rib fractures.  Degenerative changes in the shoulders. No significant change since previous study.  IMPRESSION: Stable chronic findings.  No evidence of active pulmonary disease.   Original Report Authenticated By: Lucienne Capers, M.D.    Medications: I have  reviewed the patient's current medications. Scheduled Meds:   . [COMPLETED] sodium chloride   Intravenous STAT  . antiseptic oral rinse  15 mL Mouth Rinse q12n4p  . chlorhexidine  15 mL Mouth Rinse BID  . enoxaparin (LOVENOX) injection  40 mg Subcutaneous Q24H  . FLUoxetine  40 mg Oral Daily  . folic acid  1 mg Oral Daily  . insulin aspart  0-9 Units Subcutaneous TID WC  . multivitamin with minerals  1 tablet Oral Daily  . oxybutynin  5 mg Oral Daily  . [COMPLETED] phosphorus  500 mg Oral TID  . sodium chloride  3 mL Intravenous Q12H  . thiamine  100 mg Oral Daily   Or  . thiamine  100 mg Intravenous Daily   Continuous Infusions:   . dextrose 5 % and 0.9% NaCl 200 mL/hr at 08/21/12 1500  . [DISCONTINUED] dextrose 5 %  and 0.9% NaCl 200 mL/hr at 08/21/12 0448   PRN Meds:.acetaminophen, acetaminophen, LORazepam, LORazepam, metoCLOPramide (REGLAN) injection  Assessment/Plan: Mr. Alvillar is a 66 year old male with PMH of DM II, chronic alcohol abuse, and HTN admitted for anion gap metabolic acidosis.   1. High anion gap metabolic acidosis and metabolic alkalosis: Likely secondary to alcoholic ketoacidosis with component of starvation ketoacidosis in the setting of profuse vomiting and malnutrition secondary to chronic alcohol abuse. Presented with 5 day alcohol binge with self reported 2.5 gallons of vodka, EtOH level 251. AG of 26-->13, HCO3-( 19-->25), lactic acid 7.8-->4.3, hyponatremia (128-->129), hypokalemia (2.8-->3.7), and hypochloremia (83-->91) after receiving bolus of 3L NS, 1L LR, 500cc D5 bolus, KDUR 69meq PO x2, IV Mg 2g x1, and 100mg  Thiamin in ED. Salicylates level neg. Trop x1 negative.  - Continue CIWA  - Continue Thiamine, folic acid, MVI  - F/u BMP for AG  2. Acute Renal Failure: In the setting high anion gap metabolic acidosis and alkalosis. Likely secondary to volume depletion given chronic vomiting and malnutrition. Cr 2.07 on admission-->1.41 after fluids and severely hypotensive on admission 50/30. Hx of chronic alcohol abuse. Concern for ATN and hypovolemic shock given hypotensive blood pressure, tachycardia, and organ dysfunction with lactic acid 7.8-->3.0. CK 188. Replenished with fluid bolus of NS, LR, and D5 in ED. BP at time of admission: 97/59, today 164/75 - f/u BMP  - IVF    3. EtOH Abuse: 20+ year drinking hx ~1/5th vodka daily. Hx of alcohol withdrawal seizure. EtOH level 251 on admission, and pt endorses drinking approximately 2.5 gallons of vodka over past 5 days. Found lying on the floor in vomit and feces. CIWA protocol was initiated, and cessation counseling provided by the admitting team.  4. DM II: Last Hb A1c 04/2012: 5.8, on this admission 6.4. CBGs fairly stable, in the  mid 100s. No home medications. Pt on SSI in the hospital. Will continue to monitor. -CBG monitoring with meals  -SSI  -HbA1c  -continue to monitor   5. Hypertension: Hypotensive on admission w/ BP 50/30 on arrival to the ED. He was bolused in the ED, and his pressures have since been greatly improved, and are elevated today. His home medication of HCTZ 25mg  QD was held due to his hypotension on admission. Will monitor for now.  6. DVT PPx: [place] Lovenox   7. Dispo:  Possibly today    Dispo: Disposition is deferred at this time, awaiting improvement of current medical problems.  Anticipated discharge in approximately 1-2 day(s).   The patient does have a current PCP Vertell Novak, MD),  therefore will be requiring OPC follow-up after discharge.   The patient does not know have transportation limitations that hinder transportation to clinic appointments.  .Services Needed at time of discharge: Y = Yes, Blank = No PT:   OT:   RN:   Equipment:   Other:     LOS: 2 days   Otho Bellows 08/22/2012, 7:48 AM

## 2012-08-22 NOTE — Clinical Social Work Psychosocial (Signed)
Clinical Social Work Department BRIEF PSYCHOSOCIAL ASSESSMENT 08/22/2012  Patient:  William [name], William [name]     Account Number:  [id number]     Admit date:  08/20/2012  Clinical Social Worker:  Trudie Buckler  Date/Time:  08/22/2012 03:22 PM  Referred by:  RN  Date Referred:  08/21/2012  Other Referral:   Interview type:  Family Other interview type:    PSYCHOSOCIAL DATA Living Status:  SIGNIFICANT OTHER Primary support name:  William [name] Primary support relationship to patient:  PARTNER Degree of support available:   Adequate    CURRENT CONCERNS Current Concerns  Post-Acute Placement   Other Concerns:    SOCIAL WORK ASSESSMENT / PLAN CSW consulted by RN re: clothing, alcohol cessasation information and transportation needs. CSW provided the patient with a sweater, and shoes. CSW contacted the patient's significant other to assist in transportation needs. CSW was referred to a family friend named William [name] [phone]) who will pick the patient up within the hour. Patient thanked CSW for assisting in d/c needs and resources. CSW signing off as no other psychosocial needs are present at this time.   Assessment/plan status:  Information/Referral to [employer] Other assessment/ plan:   Information/referral to community resources:    PATIENT'S/FAMILY'S RESPONSE TO PLAN OF CARE: Patient, and patient's significant other thanked CSW for assisting in d/c needs, and providing resources.    William [name], [place] Worker Contact #: [phone] (weekend)

## 2012-08-22 NOTE — Discharge Summary (Signed)
Internal [facility] Discharge Note  Name: William [name] DOB: [birth year] 66 y.o.  Date of Admission: 08/20/2012  7:23 PM Date of Discharge: 08/22/2012 Attending Physician: Dominic Pea, DO  Discharge Diagnosis: Principal Problem:  *High anion gap metabolic acidosis Active Problems:  DIABETES MELLITUS, TYPE II  HYPERLIPIDEMIA  ALCOHOL ABUSE  HYPERTENSION  Disorders of fluid, electrolyte, and acid-base balance  Hypotension  Acute renal failure  Alcoholic ketoacidosis  Alcohol intoxication in active alcoholic   Discharge Medications:   Medication List     As of 08/22/2012 10:29 AM    TAKE these medications         FLUoxetine 40 MG capsule   Commonly known as: PROZAC   Take 40 mg by mouth daily.      folic acid 1 MG tablet   Commonly known as: FOLVITE   Take 1 tablet (1 mg total) by mouth daily.      hydrochlorothiazide 25 MG tablet   Commonly known as: HYDRODIURIL   TAKE 1 TABLET BY MOUTH DAILY      ibuprofen 200 MG tablet   Commonly known as: ADVIL,MOTRIN   Take 200-800 mg by mouth every 6 (six) hours as needed. For pain      loperamide 2 MG capsule   Commonly known as: IMODIUM   Take 2 mg by mouth 4 (four) times daily as needed. For diarrhea      multivitamin with minerals Tabs   Take 1 tablet by mouth daily.      oxybutynin 5 MG 24 hr tablet   Commonly known as: DITROPAN-XL   Take 5 mg by mouth daily.      simvastatin 20 MG tablet   Commonly known as: ZOCOR   Take 1 tablet (20 mg total) by mouth every evening.      thiamine 100 MG tablet   Take 1 tablet (100 mg total) by mouth daily.         Disposition and follow-up:   [name] was discharged from [facility] in stable condition.  At the hospital follow up visit please address his alcohol consumption, his electrolyte levels, esp potassium, magnesium, and phosphorus, by checking a BMP, and his blood glucose control- his  CBGs were elevated this admission.   Follow-up Appointments:  Discharge Orders    Future Orders Please Complete By Expires   Diet Carb Modified      Increase activity slowly      Comments:   Please reduce your alcohol consumption as discussed during this hospitalization.   Call MD for:  persistant nausea and vomiting      Call MD for:  persistant dizziness or light-headedness      Call MD for:  difficulty breathing, headache or visual disturbances         Consultations:  None  Procedures Performed:  Dg Chest Port 1 View  08/20/2012  *RADIOLOGY REPORT*  Clinical Data: Alcohol intoxication.  Hypertension.  Diabetes.  PORTABLE CHEST - 1 VIEW  Comparison: 05/28/2012  Findings: Shallow inspiration with elevation of left hemidiaphragm. Normal heart size and pulmonary vascularity.  No focal airspace consolidation in the lungs.  No blunting of costophrenic angles. No pneumothorax.  Mediastinal contours appear intact.  Old bilateral rib fractures.  Degenerative changes in the shoulders. No significant change since previous study.  IMPRESSION: Stable chronic findings.  No evidence of active pulmonary disease.   Original Report Authenticated By: Lucienne Capers, M.D.     Admission  History of Present Illness: [name] is a 66 year old white male with PMH of DM II, HL, chronic alcohol abuse, and HTN presenting to [facility] from [place] after being brought by EMS when he was found by friends to be lying in vomit and feces after binge drinking x5 days. [name] claims he normally drinks 1/5th of Vodka a day but over the past 5 days he has drank approximately 2.5 gallons of vodka because his girlfriend's daughter was getting on his nerves. He admits to vomiting while drinking but denies any blood. He denies feeling drunk at this time but has some nausea. He also denies any fever, chills, headaches, dizziness, chest pain, shortness of breath, abdominal pain, diarrhea, constipation, or any urinary  complaints at this time.  Of note, he was found to have BP 50/30 on arrival to [facility] and intoxicated with EtOH level of 0.25. His lactic acid level was 7.8 and electrolyte abnormalities: hyponatremia:128, hypokalemia:2.8, and hypochloremia:83, with Cr 2.07. He subsequently received: Bolus of 3L NS, 1L LR, 500cc D5 bolus, KDUR 14meq PO x2, IV Mg 2g x1, and 100mg  Thiamine.   Admission Physical Exam:  Blood pressure 97/59, pulse 99, temperature 98.5 F (36.9 C), temperature source Oral, resp. rate 20, weight 179 lb 3.7 oz (81.3 kg), SpO2 97.00%.  Vitals reviewed.  General: resting in bed, NAD, very hard on hearing  HEENT: PERRLA, EOMI, no scleral icterus  Cardiac: tachycardia, no rubs, murmurs or gallops  Pulm: clear to auscultation bilaterally, no wheezes, rales, or rhonchi  Abd: soft, nontender, nondistended, BS present  Ext: warm and well perfused, no pedal edema  Neuro: alert and oriented X3, cranial nerves II-XII grossly intact, strength and sensation to light touch equal in bilateral upper and lower extremities     Hospital Course by problem list: 1)High anion gap metabolic acidosis and metabolic alkalosis: secondary to alcoholic ketoacidosis   in setting of profuse vomiting and malnutrition secondary to acute on chronic alcohol abuse.  He was admitted in an intoxicated state, given IV fluids with supplementation of electrolytes and vitamins including potassium, phosphorus, and thiamin and placed on CIWA protocol for monitoring and treatment of alcohol withdrawal symptoms. Admitted with lactate level of 7.8 which trended to normal by day of discharge to after appropriate hydration. Of note salicylate level was not elevated and osmolality was mildly elevated.  2) Acute Kidney Injury : Increase in creatinine level to 2.07 on admission.This is likely secondary to volume depletion and resulting Acute Tubular Necrosis from hypotension. He was managed with IV fluids resulting in adequate  urine output and return of his creatine level back to normal by day of discharge.  Lab 08/22/12 0854 08/21/12 1008 08/21/12 0702 08/21/12 0120 08/20/12 1950  CREATININE 0.96 1.41* 1.49* 1.83* 2.07*    3) Hypertension: [name]'s blood pressure was hypotensive in the ED at  87/58 with pulse 111 bpm which was likely due to volume depletion. By hospital Day #2 he remained at goal blood pressure throughout most of his hospitalization with a few readings [id number] systolic. He was resumed on his home regimen of HCTZ 25 mg daily upon discharge. Danley Danker Vitals:   08/21/12 2149 08/22/12 0335 08/22/12 0845 08/22/12 1424  BP: 148/84 164/75 162/72 105/84  Pulse: 75 77 89 116  Temp: 98.3 F (36.8 C) 98.6 F (37 C)  98.2 F (36.8 C)  TempSrc: Oral Axillary  Oral  Resp: 18 18  18   Height:      Weight:  SpO2: 100% 98%  99%    4) Type 2 Diabetes Mellitus: HgbA1c was 6.4 demonstrating adequate control, glycemic control was appropriate during his hospitalization with meal coverage insulin and sliding Scale insulin coverage. . CBG (last 3)   Basename 08/22/12 1227 08/22/12 0757 08/21/12 2225  GLUCAP 147* 155* 112*    5) Alcohol Abuse: with history of alcohol withdrawal seizures.  Etoh level was 251 on admission and he was intoxicated for most of the first day of admission.  He was managed on CIWA protocol for withdrawal and supplemented electrolytes and vitamins as noted above. Social Work was consulted and provided alcohol cessation counseling and information.  6) Disposition: Evaluated by Physical Therapy with recommendation for Home Health PT and rolling walker to maximize independence and safety with mobility at home.  Internal Medicine Clinic will contact patient for follow-up scheduling.   Discharge Vitals:  BP 162/72  Pulse 89  Temp 98.6 F (37 C) (Axillary)  Resp 18  Ht 5\' 8"  (1.727 m)  Wt 179 lb 3.7 oz (81.3 kg)  BMI 27.25 kg/m2  SpO2 98%  Discharge Labs:  Results for  orders placed during the hospital encounter of 08/20/12 (from the past 24 hour(s))  LACTIC ACID, PLASMA     Status: Abnormal   Collection Time   08/21/12 12:14 PM      Component Value Range   Lactic Acid, Venous 3.0 (*) 0.5 - 2.2 mmol/L  GLUCOSE, CAPILLARY     Status: Abnormal   Collection Time   08/21/12 12:31 PM      Component Value Range   Glucose-Capillary 152 (*) 70 - 99 mg/dL  GLUCOSE, CAPILLARY     Status: Abnormal   Collection Time   08/21/12  5:05 PM      Component Value Range   Glucose-Capillary 153 (*) 70 - 99 mg/dL  GLUCOSE, CAPILLARY     Status: Abnormal   Collection Time   08/21/12 10:25 PM      Component Value Range   Glucose-Capillary 112 (*) 70 - 99 mg/dL   Comment 1 Notify RN    GLUCOSE, CAPILLARY     Status: Abnormal   Collection Time   08/22/12  7:57 AM      Component Value Range   Glucose-Capillary 155 (*) 70 - 99 mg/dL  BASIC METABOLIC PANEL     Status: Abnormal   Collection Time   08/22/12  8:54 AM      Component Value Range   Sodium 134 (*) 135 - 145 mEq/L   Potassium 2.9 (*) 3.5 - 5.1 mEq/L   Chloride 99  96 - 112 mEq/L   CO2 24  19 - 32 mEq/L   Glucose, Bld 182 (*) 70 - 99 mg/dL   BUN 9  6 - 23 mg/dL   Creatinine, Ser 0.96  0.50 - 1.35 mg/dL   Calcium 8.0 (*) 8.4 - 10.5 mg/dL   GFR calc non Af Amer 83 (*) >90 mL/min   GFR calc Af Amer >90  >90 mL/min  LACTIC ACID, PLASMA     Status: Normal   Collection Time   08/22/12  8:54 AM      Component Value Range   Lactic Acid, Venous 2.0  0.5 - 2.2 mmol/L    Signed: Saleah Rishel 08/22/2012, 10:29 AM   Time Spent on Discharge: 35 min Services Ordered on Discharge: None Equipment Ordered on Discharge: None

## 2012-08-22 NOTE — Progress Notes (Signed)
Physical Therapy Evaluation Patient Details Name: William [name] MRN: [id number] DOB: [birth year] Today's Date: 08/22/2012 Time: [id number] PT Time Calculation (min): 25 min  PT Assessment / Plan / Recommendation Clinical Impression  66 yo male admitted with metabolic acidosis in setting of chronic alcohol abuse; Presents with decr functional mobility; Will benefit from PT to maximize independence and safety awith mobility, and RW use and enable safe dc home    PT Assessment  Patient needs continued PT services    Follow Up Recommendations  Home health PT;Supervision/Assistance - 24 hour    Does the patient have the potential to tolerate intense rehabilitation      Barriers to Discharge   Would be beneficial to know exactly how much assist is available to pt at home    Equipment Recommendations  Rolling walker with 5" wheels    Recommendations for Other Services     Frequency Min 3X/week    Precautions / Restrictions Precautions Precautions: Fall   Pertinent Vitals/Pain no apparent distress       Mobility  Bed Mobility Bed Mobility: Not assessed (Pt presented in chair) Transfers Transfers: Sit to Stand;Stand to Sit Sit to Stand: 4: Min guard;With upper extremity assist;From chair/3-in-1 Stand to Sit: 4: Min guard;With upper extremity assist;To chair/3-in-1 Details for Transfer Assistance: Heavily dependent on UE push for successful sit to stand; Cues for safety and hand placement; tending to reach out for anything, including rolling table, which is unsafe Ambulation/Gait Ambulation/Gait Assistance: 4: Min guard (with and without physical contact) Ambulation Distance (Feet): 150 Feet Assistive device: Rolling walker Ambulation/Gait Assistance Details: Cues for posture, RW use, and RW proximity; Pt initially tending to pick RW up for progression; cues to glide RW smoothly; Cues also to self monitor for activity tol Gait Pattern: Step-through pattern;Ataxic;Trunk  flexed General Gait Details: [place] ataxic with erratic step length and width Stairs: No    Shoulder Instructions     Exercises     PT Diagnosis: Difficulty walking  PT Problem List: Decreased strength;Decreased activity tolerance;Decreased balance;Decreased mobility;Decreased coordination;Decreased cognition;Decreased knowledge of use of DME;Decreased safety awareness PT Treatment Interventions: DME instruction;Gait training;Stair training;Functional mobility training;Therapeutic activities;Therapeutic exercise;Balance training;Patient/family education   PT Goals Acute Rehab PT Goals PT Goal Formulation: With patient Time For Goal Achievement: 09/05/12 Potential to Achieve Goals: Fair Pt will go Supine/Side to Sit: with modified independence PT Goal: Supine/Side to Sit - Progress: Goal set today Pt will go Sit to Supine/Side: with modified independence PT Goal: Sit to Supine/Side - Progress: Goal set today Pt will go Sit to Stand: with modified independence PT Goal: Sit to Stand - Progress: Goal set today Pt will go Stand to Sit: with modified independence PT Goal: Stand to Sit - Progress: Goal set today Pt will Ambulate: >150 feet;with modified independence;with rolling walker PT Goal: Ambulate - Progress: Goal set today  Visit Information  Last PT Received On: 08/22/12 Assistance Needed: +1    Subjective Data  Subjective: Is trying to set up his ride home Patient Stated Goal: home   Prior [place] Lives With: Significant other (?Girlfriend?) Available Help at Discharge: Family;Available PRN/intermittently Type of Home: House Home Access: Level entry Home Layout: One level Home Adaptive Equipment: Walker - standard Prior Function Level of Independence: Independent with assistive device(s) Communication Communication: HOH    Cognition  Overall Cognitive Status: No family/caregiver present to determine baseline cognitive  functioning Arousal/Alertness: Awake/alert Orientation Level: Appears intact for tasks assessed Behavior During Session: [facility] for tasks  performed    Extremity/Trunk Assessment Right Upper Extremity Assessment RUE ROM/Strength/Tone: [facility] for tasks assessed Left Upper Extremity Assessment LUE ROM/Strength/Tone: WFL for tasks assessed Right Lower Extremity Assessment RLE ROM/Strength/Tone: Deficits RLE ROM/Strength/Tone Deficits: Generally weak, requiring UE assist for successful sit to stand Left Lower Extremity Assessment LLE ROM/Strength/Tone: Deficits LLE ROM/Strength/Tone Deficits: Generally weak, requiring UE assist for successful sit to stand Trunk Assessment Trunk Assessment: Normal   Balance    End of Session PT - End of Session Equipment Utilized During Treatment: Gait belt Activity Tolerance: Patient tolerated treatment well Patient left: in chair;with call bell/phone within reach Nurse Communication: Mobility status  GP     Roney [facility] [place], [place]  08/22/2012, 1:56 PM

## 2012-08-22 NOTE — Progress Notes (Signed)
   CARE MANAGEMENT NOTE 08/22/2012  Patient:  William [name], William [name]   Account Number:  [id number]  Date Initiated:  08/22/2012  Documentation initiated by:  [facility]  Subjective/Objective Assessment:   Metabolic Acidosis     Action/Plan:   lives at home with girlfriend's dtr, and girlfriend, Izora Gala helps with his care   Anticipated DC Date:  08/22/2012   Anticipated DC Plan:  [place]  CM consult      [facility] Choice  HOME HEALTH   Choice offered to / List presented to:  C-1 Patient   DME arranged  3-N-1  Vassie Moselle      DME agency  [place] arranged  [place]      [place] agency  [place]   Status of service:  Completed, signed off Medicare Important Message given?   (If response is "NO", the following Medicare IM given date fields will be blank) Date Medicare IM given:   Date Additional Medicare IM given:    Discharge Disposition:  [place]  Per UR Regulation:    If discussed at Long Length of Stay Meetings, dates discussed:    Comments:  08/22/2012 1230 NCM spoke to pt and offered choice for [facility]. Pt agreeable to [employer] for [facility]. NCM contacted Lovey Newcomer rep for [facility]. Faxed orders, F2F and d/c summary for scheduled d/c today. DME rep call for RW and 3n1 for home. Jonnie Finner RN CCM Case Mgmt phone [phone]

## 2012-08-25 NOTE — Discharge Summary (Signed)
INTERNAL MEDICINE TEACHING SERVICE Attending Note  Date: 08/25/2012  Patient name: William [name]  Medical record number: [id number]  Date of birth: [birth year]    This patient has been seen and discussed with the house staff. Please see their note for complete details. I concur with their findings.   Dominic Pea, DO  08/25/2012, 5:01 PM

## 2012-08-27 LAB — CULTURE, BLOOD (ROUTINE X 2): Culture: NO GROWTH

## 2012-08-30 ENCOUNTER — Telehealth: Payer: Self-pay | Admitting: *Deleted

## 2012-08-30 NOTE — Telephone Encounter (Signed)
[facility] calls and states was pt to have  [facility] visits after disch from hosp? Is he to be on any meds for diab? Is he to check blood sugar? He has f/u 12/6

## 2012-08-31 NOTE — Telephone Encounter (Signed)
I do not think he has been diagnosed with DM although he was recently in the hospital. It does not look like he is on anything since then although you could check with D/C doctor to be sure. I do not think he should be checking sugars at home.   Thanks,  Dr. Doug Sou  I will also forward this to D/C doc and if they disagree they can change this.

## 2012-09-01 ENCOUNTER — Telehealth: Payer: Self-pay | Admitting: *Deleted
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
## 2012-09-02 ENCOUNTER — Emergency Department (HOSPITAL_COMMUNITY): Payer: Medicare Other

## 2012-09-02 ENCOUNTER — Encounter (HOSPITAL_COMMUNITY): Payer: Self-pay | Admitting: *Deleted

## 2012-09-02 ENCOUNTER — Emergency Department (HOSPITAL_COMMUNITY)
Admission: EM | Admit: 2012-09-02 | Discharge: 2012-09-04 | Disposition: A | Discharge status: Home / Self Care | Payer: Medicare Other | Attending: Emergency Medicine | Admitting: Emergency Medicine

## 2012-09-02 DIAGNOSIS — Z79899 Other long term (current) drug therapy: Secondary | ICD-10-CM | POA: Insufficient documentation

## 2012-09-02 DIAGNOSIS — S0101XA Laceration without foreign body of scalp, initial encounter: Secondary | ICD-10-CM

## 2012-09-02 DIAGNOSIS — IMO0001 Reserved for inherently not codable concepts without codable children: Secondary | ICD-10-CM | POA: Insufficient documentation

## 2012-09-02 DIAGNOSIS — I6789 Other cerebrovascular disease: Secondary | ICD-10-CM | POA: Diagnosis not present

## 2012-09-02 DIAGNOSIS — S0990XA Unspecified injury of head, initial encounter: Secondary | ICD-10-CM | POA: Insufficient documentation

## 2012-09-02 DIAGNOSIS — Y939 Activity, unspecified: Secondary | ICD-10-CM | POA: Insufficient documentation

## 2012-09-02 DIAGNOSIS — N289 Disorder of kidney and ureter, unspecified: Secondary | ICD-10-CM

## 2012-09-02 DIAGNOSIS — S51809A Unspecified open wound of unspecified forearm, initial encounter: Secondary | ICD-10-CM | POA: Insufficient documentation

## 2012-09-02 DIAGNOSIS — F17200 Nicotine dependence, unspecified, uncomplicated: Secondary | ICD-10-CM | POA: Insufficient documentation

## 2012-09-02 DIAGNOSIS — S51819A Laceration without foreign body of unspecified forearm, initial encounter: Secondary | ICD-10-CM

## 2012-09-02 DIAGNOSIS — F10129 Alcohol abuse with intoxication, unspecified: Secondary | ICD-10-CM

## 2012-09-02 DIAGNOSIS — Y33XXXA Other specified events, undetermined intent, initial encounter: Secondary | ICD-10-CM | POA: Insufficient documentation

## 2012-09-02 DIAGNOSIS — F411 Generalized anxiety disorder: Secondary | ICD-10-CM | POA: Insufficient documentation

## 2012-09-02 DIAGNOSIS — F10229 Alcohol dependence with intoxication, unspecified: Secondary | ICD-10-CM | POA: Insufficient documentation

## 2012-09-02 DIAGNOSIS — M5030 Other cervical disc degeneration, unspecified cervical region: Secondary | ICD-10-CM | POA: Diagnosis not present

## 2012-09-02 DIAGNOSIS — I10 Essential (primary) hypertension: Secondary | ICD-10-CM | POA: Insufficient documentation

## 2012-09-02 DIAGNOSIS — E785 Hyperlipidemia, unspecified: Secondary | ICD-10-CM | POA: Insufficient documentation

## 2012-09-02 DIAGNOSIS — S0100XA Unspecified open wound of scalp, initial encounter: Secondary | ICD-10-CM | POA: Insufficient documentation

## 2012-09-02 DIAGNOSIS — Y929 Unspecified place or not applicable: Secondary | ICD-10-CM | POA: Insufficient documentation

## 2012-09-02 LAB — CBC WITH DIFFERENTIAL/PLATELET
Basophils Absolute: 0.1 10*3/uL (ref 0.0–0.1)
Basophils Relative: 1 % (ref 0–1)
Eosinophils Absolute: 0 10*3/uL (ref 0.0–0.7)
HCT: 35.7 % — ABNORMAL LOW (ref 39.0–52.0)
MCH: 32.5 pg (ref 26.0–34.0)
MCHC: 34.2 g/dL (ref 30.0–36.0)
Monocytes Absolute: 0.5 10*3/uL (ref 0.1–1.0)
Neutro Abs: 11.2 10*3/uL — ABNORMAL HIGH (ref 1.7–7.7)
Neutrophils Relative %: 88 % — ABNORMAL HIGH (ref 43–77)
RDW: 14.6 % (ref 11.5–15.5)

## 2012-09-02 LAB — COMPREHENSIVE METABOLIC PANEL
AST: 23 U/L (ref 0–37)
Albumin: 3.7 g/dL (ref 3.5–5.2)
BUN: 18 mg/dL (ref 6–23)
Chloride: 90 mEq/L — ABNORMAL LOW (ref 96–112)
Creatinine, Ser: 2.01 mg/dL — ABNORMAL HIGH (ref 0.50–1.35)
Total Bilirubin: 0.4 mg/dL (ref 0.3–1.2)
Total Protein: 7.1 g/dL (ref 6.0–8.3)

## 2012-09-02 LAB — ETHANOL: Alcohol, Ethyl (B): 63 mg/dL — ABNORMAL HIGH (ref 0–11)

## 2012-09-02 LAB — RAPID URINE DRUG SCREEN, HOSP PERFORMED
Amphetamines: NOT DETECTED
Opiates: NOT DETECTED
Tetrahydrocannabinol: NOT DETECTED

## 2012-09-02 LAB — URINALYSIS, ROUTINE W REFLEX MICROSCOPIC
Glucose, UA: NEGATIVE mg/dL
Ketones, ur: NEGATIVE mg/dL
Protein, ur: NEGATIVE mg/dL

## 2012-09-02 LAB — URINE MICROSCOPIC-ADD ON

## 2012-09-02 MED ORDER — ONDANSETRON HCL 4 MG/2ML IJ SOLN
4.0000 mg | Freq: Once | INTRAMUSCULAR | Status: AC
  Administered 2012-09-02: 4 mg via INTRAVENOUS
  Filled 2012-09-02: qty 2

## 2012-09-02 MED ORDER — SODIUM CHLORIDE 0.9 % IV BOLUS (SEPSIS)
1000.0000 mL | Freq: Once | INTRAVENOUS | Status: AC
  Administered 2012-09-02: 1000 mL via INTRAVENOUS

## 2012-09-02 MED ORDER — DIPHENHYDRAMINE HCL 25 MG PO CAPS
25.0000 mg | ORAL_CAPSULE | Freq: Once | ORAL | Status: AC
  Administered 2012-09-03: 25 mg via ORAL
  Filled 2012-09-02: qty 1

## 2012-09-02 MED ORDER — IBUPROFEN 800 MG PO TABS
800.0000 mg | ORAL_TABLET | Freq: Once | ORAL | Status: AC
  Administered 2012-09-02: 800 mg via ORAL
  Filled 2012-09-02: qty 1
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
## 2012-09-02 NOTE — ED Notes (Signed)
Pt was taken to TCU to perform in and out cath. Informed by NT that pt was actively vomiting. [place], EDP made aware. New orders received. IV and fluids will be started upon pt's return to assigned hall bed.
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
## 2012-09-02 NOTE — ED Notes (Signed)
Lab informed that another urine needs to be collected for pt.

## 2012-09-02 NOTE — ED Notes (Signed)
Pt unable to void at this time. 

## 2012-09-03 ENCOUNTER — Ambulatory Visit: Payer: Medicare Other | Admitting: Internal Medicine

## 2012-09-03 ENCOUNTER — Encounter (HOSPITAL_COMMUNITY): Payer: Self-pay | Admitting: *Deleted

## 2012-09-03 DIAGNOSIS — F1994 Other psychoactive substance use, unspecified with psychoactive substance-induced mood disorder: Secondary | ICD-10-CM

## 2012-09-03 DIAGNOSIS — F1020 Alcohol dependence, uncomplicated: Secondary | ICD-10-CM

## 2012-09-03 LAB — POCT I-STAT, CHEM 8
Calcium, Ion: 1.14 mmol/L (ref 1.13–1.30)
Chloride: 103 mEq/L (ref 96–112)
Glucose, Bld: 114 mg/dL — ABNORMAL HIGH (ref 70–99)
HCT: 32 % — ABNORMAL LOW (ref 39.0–52.0)

## 2012-09-03 MED ORDER — LORAZEPAM 1 MG PO TABS
0.0000 mg | ORAL_TABLET | Freq: Four times a day (QID) | ORAL | Status: DC
  Administered 2012-09-03 – 2012-09-04 (×2): 1 mg via ORAL
  Filled 2012-09-03 (×2): qty 1

## 2012-09-03 MED ORDER — ADULT MULTIVITAMIN W/MINERALS CH
1.0000 | ORAL_TABLET | Freq: Every day | ORAL | Status: DC
  Administered 2012-09-03 – 2012-09-04 (×2): 1 via ORAL
  Filled 2012-09-03 (×2): qty 1

## 2012-09-03 MED ORDER — HYDROCHLOROTHIAZIDE 25 MG PO TABS
25.0000 mg | ORAL_TABLET | Freq: Every day | ORAL | Status: DC
  Administered 2012-09-03 – 2012-09-04 (×2): 25 mg via ORAL
  Filled 2012-09-03 (×2): qty 1

## 2012-09-03 MED ORDER — SODIUM CHLORIDE 0.9 % IV BOLUS (SEPSIS)
1000.0000 mL | Freq: Once | INTRAVENOUS | Status: AC
  Administered 2012-09-03: 1000 mL via INTRAVENOUS

## 2012-09-03 MED ORDER — FOLIC ACID 1 MG PO TABS
1.0000 mg | ORAL_TABLET | Freq: Every day | ORAL | Status: DC
  Administered 2012-09-03 – 2012-09-04 (×2): 1 mg via ORAL
  Filled 2012-09-03 (×2): qty 1

## 2012-09-03 MED ORDER — THIAMINE HCL 100 MG PO TABS: 100.0000 mg | ORAL_TABLET | Freq: Every day | ORAL | Status: DC

## 2012-09-03 MED ORDER — OXYBUTYNIN CHLORIDE ER 5 MG PO TB24
5.0000 mg | ORAL_TABLET | Freq: Every day | ORAL | Status: DC
  Administered 2012-09-03 – 2012-09-04 (×2): 5 mg via ORAL
  Filled 2012-09-03 (×2): qty 1

## 2012-09-03 MED ORDER — VITAMIN B-1 100 MG PO TABS
100.0000 mg | ORAL_TABLET | Freq: Every day | ORAL | Status: DC
  Administered 2012-09-03 – 2012-09-04 (×2): 100 mg via ORAL
  Filled 2012-09-03 (×2): qty 1

## 2012-09-03 MED ORDER — ADULT MULTIVITAMIN W/MINERALS CH: 1.0000 | ORAL_TABLET | Freq: Every day | ORAL | Status: DC

## 2012-09-03 MED ORDER — LORAZEPAM 1 MG PO TABS: 0.0000 mg | ORAL_TABLET | Freq: Two times a day (BID) | ORAL | Status: DC

## 2012-09-03 MED ORDER — SODIUM CHLORIDE 0.9 % IV SOLN
INTRAVENOUS | Status: DC
  Administered 2012-09-03: 13:00:00 via INTRAVENOUS
  Administered 2012-09-04: 125 mL/h via INTRAVENOUS

## 2012-09-03 MED ORDER — ACETAMINOPHEN 325 MG PO TABS
650.0000 mg | ORAL_TABLET | Freq: Four times a day (QID) | ORAL | Status: DC | PRN
  Administered 2012-09-03: 650 mg via ORAL
  Filled 2012-09-03: qty 2

## 2012-09-03 MED ORDER — SIMVASTATIN 20 MG PO TABS
20.0000 mg | ORAL_TABLET | Freq: Every day | ORAL | Status: DC
  Administered 2012-09-03 (×2): 20 mg via ORAL
  Filled 2012-09-03 (×4): qty 1

## 2012-09-03 MED ORDER — FLUOXETINE HCL 20 MG PO CAPS
40.0000 mg | ORAL_CAPSULE | Freq: Every day | ORAL | Status: DC
  Administered 2012-09-03 – 2012-09-04 (×2): 40 mg via ORAL
  Filled 2012-09-03 (×2): qty 2

## 2012-09-03 MED ORDER — FOLIC ACID 1 MG PO TABS
1.0000 mg | ORAL_TABLET | Freq: Every day | ORAL | Status: DC
Start: 2012-09-03 — End: 2012-09-04

## 2012-09-03 NOTE — ED Provider Notes (Signed)
Pt with elevated creat likely from dehydration. Iv fluids and repeat istat8 ordered, Dr. Lenna Sciara to see today. Vitals stable overnight  Leota Jacobsen, MD 09/03/12 [phone]

## 2012-09-03 NOTE — Telephone Encounter (Signed)
I also do not have direct knowledge of this and would recommend forwarding to doctor to see him in clinic when he is discharged.   Thanks,  Dr. Doug Sou

## 2012-09-03 NOTE — ED Notes (Signed)
Eating lunch 
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
## 2012-09-03 NOTE — ED Notes (Signed)
Pt has been wanded by security and changed into blue scrubs. Watch remains with the pt as he has yet to be transferred to psych ED and it is a source of agitation. Pt in agreement that if and once moved to psych ed he is to surrender watch to be locked up by staff until time of discharge Campos-Garcia, Claiborne Billings
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
## 2012-09-03 NOTE — ED Notes (Signed)
Pt transferred to the TCU in hospital; no personal belongs except one watch brought. Previous RN unaware of pt having extra belongs. Searched in Main ED nursing station, triage lockers and no belongings found

## 2012-09-04 NOTE — ED Notes (Signed)
CSW was notified by pt's RN that RN was unable to reach pt's wife to coordinate transportation upon discharge. CSW telephoned contacts listed in pt's chart (which included Briscoe Deutscher and [name]) to provide assistance. CSW was unable to reach Briscoe Deutscher and left a voicemail requesting a return phone call. CSW reached [name] (family friend) who stated that she also attempted to get in contact with Briscoe Deutscher but received no response. [name] informed CSW that she is unable to pick up patient upon discharge; however, pt can take the bus to her residence. [name] then spoke to pt via telephone and confirmed with pt that d/c plan was appropriate. Pt agreed and stated that he knows how to use public transit to get to Ms. Barbara's home. CSW provided pt with bus pass and route information in the event that it is needed. Shoes were provided to pt through lost and found due to pt not having any in his possession. No other concerns verbalized to CSW at this time.   Boyce Medici., MSW, [facility] Weekend ED Clinical Social Worker [phone]

## 2012-09-04 NOTE — ED Notes (Signed)
Pt given pair of boots that had been in dept since January, 2013, bus pass and bus route to enable him on his way home. Going to a friend's house so they can try to get his wife to come to the door.
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
## 2012-12-09 ENCOUNTER — Encounter: Payer: Self-pay | Admitting: Internal Medicine

## 2012-12-26 ENCOUNTER — Other Ambulatory Visit: Payer: Self-pay | Admitting: Internal Medicine

## 2012-12-27 NOTE — Telephone Encounter (Signed)
Patient needs an appointment. Will rx 30 pills no refills but needs to be seen in the next month.   Dr. Doug Sou

## 2013-01-24 ENCOUNTER — Other Ambulatory Visit: Payer: Self-pay | Admitting: Internal Medicine

## 2013-01-25 NOTE — Telephone Encounter (Signed)
I thought that this patient was moving around last December. If he is still our patient he needs to be seen.  Thanks,  Dr. Doug Sou

## 2013-01-26 NOTE — Telephone Encounter (Signed)
Pt scheduled for OV 5/9

## 2013-01-28 ENCOUNTER — Encounter: Payer: Medicare Other | Admitting: Internal Medicine

## 2013-01-28 ENCOUNTER — Other Ambulatory Visit: Payer: Self-pay | Admitting: *Deleted

## 2013-01-28 DIAGNOSIS — E785 Hyperlipidemia, unspecified: Secondary | ICD-10-CM

## 2013-01-28 DIAGNOSIS — I10 Essential (primary) hypertension: Secondary | ICD-10-CM

## 2013-01-28 DIAGNOSIS — F329 Major depressive disorder, single episode, unspecified: Secondary | ICD-10-CM

## 2013-01-28 MED ORDER — OXYBUTYNIN CHLORIDE ER 5 MG PO TB24: 5.0000 mg | ORAL_TABLET | Freq: Every day | ORAL | Status: DC

## 2013-01-28 MED ORDER — SIMVASTATIN 20 MG PO TABS: 20.0000 mg | ORAL_TABLET | Freq: Every day | ORAL | Status: DC

## 2013-01-28 MED ORDER — FLUOXETINE HCL 40 MG PO CAPS: 40.0000 mg | ORAL_CAPSULE | Freq: Every day | ORAL | Status: DC

## 2013-01-28 MED ORDER — HYDROCHLOROTHIAZIDE 25 MG PO TABS: 25.0000 mg | ORAL_TABLET | Freq: Every day | ORAL | Status: DC

## 2013-01-28 NOTE — Telephone Encounter (Signed)
Pt was not aware appointment was cancelled. He is here for office checkup.  Pt states diarrhea on and off for over 2 months.  Wife states pt abuses ETOH. Drinks beer everyday ( 18 a day) .  If he does not drink he will have seizures.  Pt scheduled for Monday

## 2013-01-28 NOTE — Telephone Encounter (Signed)
Medications refilled except for the lisinopril as I could not find this was a recent medication he was on.  His issues are chronic and it is appropriate to schedule him for an appointment in 3 days when there is an available slot.

## 2013-01-31 ENCOUNTER — Ambulatory Visit (INDEPENDENT_AMBULATORY_CARE_PROVIDER_SITE_OTHER): Payer: Medicare Other | Admitting: Internal Medicine

## 2013-01-31 ENCOUNTER — Encounter: Payer: Self-pay | Admitting: Internal Medicine

## 2013-01-31 VITALS — BP 178/98 | HR 67 | Temp 99.3°F | Ht 68.0 in | Wt 198.7 lb

## 2013-01-31 DIAGNOSIS — E119 Type 2 diabetes mellitus without complications: Secondary | ICD-10-CM

## 2013-01-31 DIAGNOSIS — F1010 Alcohol abuse, uncomplicated: Secondary | ICD-10-CM

## 2013-01-31 DIAGNOSIS — E785 Hyperlipidemia, unspecified: Secondary | ICD-10-CM

## 2013-01-31 DIAGNOSIS — I10 Essential (primary) hypertension: Secondary | ICD-10-CM

## 2013-01-31 LAB — GLUCOSE, CAPILLARY: Glucose-Capillary: 114 mg/dL — ABNORMAL HIGH (ref 70–99)

## 2013-01-31 MED ORDER — FOLIC ACID 1 MG PO TABS: 1.0000 mg | ORAL_TABLET | Freq: Every day | ORAL | Status: DC

## 2013-01-31 MED ORDER — THIAMINE HCL 100 MG PO TABS: 100.0000 mg | ORAL_TABLET | Freq: Every day | ORAL | Status: DC

## 2013-01-31 MED ORDER — ADULT MULTIVITAMIN W/MINERALS CH: 1.0000 | ORAL_TABLET | Freq: Every day | ORAL | Status: DC

## 2013-01-31 NOTE — Assessment & Plan Note (Signed)
Chronic alcohol abuse. Advised him to start back on folic acid, multivitamin and thiamine. He will pick it up from pharmacy today.

## 2013-01-31 NOTE — Progress Notes (Signed)
I have discussed this case with Dr. Posey Pronto soon after patient visit , read the documentation and I agree with the plan of care provided. Please see the resident note for details of management.

## 2013-01-31 NOTE — Assessment & Plan Note (Signed)
BP Readings from Last 3 Encounters:  01/31/13 178/98  09/04/12 165/100  08/22/12 105/84    Lab Results  Component Value Date   NA 139 09/03/2012   K 4.6 09/03/2012   CREATININE 1.60* 09/03/2012    Assessment: Blood pressure control:   uncontrolled Progress toward BP goal:    worsening Comments:   Plan: Medications:  continue current medications, Unclear compliance. Educational resources provided:   Self management tools provided:   Other plans: Unclear if patient is compliant with medications. Reiterated compliance. I will see him back in 10-14 days for recheck.

## 2013-01-31 NOTE — Assessment & Plan Note (Signed)
Continue simvastatin. 

## 2013-01-31 NOTE — Progress Notes (Signed)
  Subjective:    Patient ID: William [name], male    DOB: [birth year], 67 y.o.   MRN: [id number]  HPI patient is a 67 year old man with chronic alcohol abuse, DM 2, hyperlipidemia, hypertension and problems as per problem list who comes to the clinic for regular followup visit.  His blood pressure today is elevated- 178/98. His last A1c was 6.4 in November 2013. He denies any new symptoms been no fever, chills, nausea, vomiting, abdominal pain, chest pain, short of breath, diarrhea, headache, palpitations.  He lives with his granddaughter who helps him taking medications.   Review of Systems    As per history of present illness. Objective:   Physical Exam  General: Disheveled man. Looks chronically ill. HEENT: PERRL, EOMI, no scleral icterus Cardiac: S1, S2, RRR, no rubs, murmurs or gallops Pulm: clear to auscultation bilaterally, moving normal volumes of air Abd: soft, nontender, nondistended, BS present Ext: warm and well perfused, no pedal edema Neuro: alert and oriented X3, cranial nerves II-XII grossly intact       Assessment & Plan:

## 2013-01-31 NOTE — Assessment & Plan Note (Signed)
Lab Results  Component Value Date   HGBA1C 6.4* 08/21/2012   HGBA1C 5.8* 05/28/2012   HGBA1C 6.0 02/19/2012     Assessment: Diabetes control:   unknown Progress toward A1C goal:    Comments: A1c pending  Plan: Medications:  Not on any medications. Home glucose monitoring: Frequency:   Timing:   Instruction/counseling given: no instruction/counseling  Educational resources provided:   Self management tools provided:   Other plans: Until understanding of disease. Needs close monitoring and counseling along with social support.

## 2013-01-31 NOTE — Patient Instructions (Addendum)
Please make followup appointment in 10-14 days for blood pressure.  Continue taking all medications regularly.  Start taking folic acid, multivitamin and thiamine as prescribed.

## 2013-02-04 ENCOUNTER — Encounter: Payer: Medicare Other | Admitting: Internal Medicine

## 2013-02-05 ENCOUNTER — Emergency Department (HOSPITAL_COMMUNITY)
Admission: EM | Admit: 2013-02-05 | Discharge: 2013-02-06 | Disposition: A | Discharge status: Home / Self Care | Payer: Medicare Other | Attending: Emergency Medicine | Admitting: Emergency Medicine

## 2013-02-05 ENCOUNTER — Encounter (HOSPITAL_COMMUNITY): Payer: Self-pay | Admitting: Emergency Medicine

## 2013-02-05 DIAGNOSIS — H269 Unspecified cataract: Secondary | ICD-10-CM | POA: Insufficient documentation

## 2013-02-05 DIAGNOSIS — E119 Type 2 diabetes mellitus without complications: Secondary | ICD-10-CM | POA: Insufficient documentation

## 2013-02-05 DIAGNOSIS — F411 Generalized anxiety disorder: Secondary | ICD-10-CM | POA: Insufficient documentation

## 2013-02-05 DIAGNOSIS — E785 Hyperlipidemia, unspecified: Secondary | ICD-10-CM | POA: Insufficient documentation

## 2013-02-05 DIAGNOSIS — F1010 Alcohol abuse, uncomplicated: Secondary | ICD-10-CM | POA: Insufficient documentation

## 2013-02-05 DIAGNOSIS — I10 Essential (primary) hypertension: Secondary | ICD-10-CM | POA: Insufficient documentation

## 2013-02-05 DIAGNOSIS — F10929 Alcohol use, unspecified with intoxication, unspecified: Secondary | ICD-10-CM

## 2013-02-05 DIAGNOSIS — F17200 Nicotine dependence, unspecified, uncomplicated: Secondary | ICD-10-CM | POA: Insufficient documentation

## 2013-02-05 DIAGNOSIS — H9190 Unspecified hearing loss, unspecified ear: Secondary | ICD-10-CM | POA: Insufficient documentation

## 2013-02-05 DIAGNOSIS — Z79899 Other long term (current) drug therapy: Secondary | ICD-10-CM | POA: Insufficient documentation

## 2013-02-05 LAB — BASIC METABOLIC PANEL
BUN: 16 mg/dL (ref 6–23)
CO2: 23 mEq/L (ref 19–32)
Calcium: 9 mg/dL (ref 8.4–10.5)
Chloride: 109 mEq/L (ref 96–112)
Creatinine, Ser: 1.07 mg/dL (ref 0.50–1.35)
GFR calc Af Amer: 80 mL/min — ABNORMAL LOW (ref >90)
GFR calc non Af Amer: 69 mL/min — ABNORMAL LOW (ref >90)
Glucose, Bld: 106 mg/dL — ABNORMAL HIGH (ref 70–99)
Potassium: 4.8 mEq/L (ref 3.5–5.1)
Sodium: 140 mEq/L (ref 135–145)

## 2013-02-05 LAB — RAPID URINE DRUG SCREEN, HOSP PERFORMED
Amphetamines: NOT DETECTED
Barbiturates: NOT DETECTED
Benzodiazepines: NOT DETECTED
Cocaine: NOT DETECTED
Opiates: NOT DETECTED
Tetrahydrocannabinol: NOT DETECTED

## 2013-02-05 LAB — CBC
HCT: 36.1 % — ABNORMAL LOW (ref 39.0–52.0)
Hemoglobin: 12.5 g/dL — ABNORMAL LOW (ref 13.0–17.0)
MCH: 33.2 pg (ref 26.0–34.0)
MCHC: 34.6 g/dL (ref 30.0–36.0)
MCV: 95.8 fL (ref 78.0–100.0)
Platelets: 159 10*3/uL (ref 150–400)
RBC: 3.77 MIL/uL — ABNORMAL LOW (ref 4.22–5.81)
RDW: 13.8 % (ref 11.5–15.5)
WBC: 5.4 10*3/uL (ref 4.0–10.5)

## 2013-02-05 LAB — ETHANOL: Alcohol, Ethyl (B): 189 mg/dL — ABNORMAL HIGH (ref 0–11)

## 2013-02-05 LAB — URINALYSIS, ROUTINE W REFLEX MICROSCOPIC
Bilirubin Urine: NEGATIVE
Glucose, UA: NEGATIVE mg/dL
Hgb urine dipstick: NEGATIVE
Ketones, ur: NEGATIVE mg/dL
Leukocytes, UA: NEGATIVE
Nitrite: NEGATIVE
Protein, ur: NEGATIVE mg/dL
Specific Gravity, Urine: 1.015 (ref 1.005–1.030)
Urobilinogen, UA: 0.2 mg/dL (ref 0.0–1.0)
pH: 5 (ref 5.0–8.0)
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
## 2013-02-05 NOTE — ED Notes (Signed)
Unable to obtain information from pt., uncooperative ,yelling and agitated. MD at bedside. [employer] at bedside

## 2013-02-06 MED ORDER — IBUPROFEN 200 MG PO TABS
400.0000 mg | ORAL_TABLET | Freq: Once | ORAL | Status: AC
  Administered 2013-02-06: 400 mg via ORAL
  Filled 2013-02-06: qty 2

## 2013-02-06 NOTE — ED Notes (Signed)
Pt given bus pass ?
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
## 2013-02-06 NOTE — ED Provider Notes (Signed)
6:10 AM patient ambulating emergency department with his walker no acute distress.  He is not suicidal or homicidal. No hallucinations or psychosis. Patient admits to alcohol use but does not have any desire for detox.  No indication for IVC or admit at this time. Plan discharge home with referrals provided.   Results for orders placed during the hospital encounter of 02/05/13  URINALYSIS, ROUTINE W REFLEX MICROSCOPIC      Result Value Range   Color, Urine YELLOW  YELLOW   APPearance CLEAR  CLEAR   Specific Gravity, Urine 1.015  1.005 - 1.030   pH 5.0  5.0 - 8.0   Glucose, UA NEGATIVE  NEGATIVE mg/dL   Hgb urine dipstick NEGATIVE  NEGATIVE   Bilirubin Urine NEGATIVE  NEGATIVE   Ketones, ur NEGATIVE  NEGATIVE mg/dL   Protein, ur NEGATIVE  NEGATIVE mg/dL   Urobilinogen, UA 0.2  0.0 - 1.0 mg/dL   Nitrite NEGATIVE  NEGATIVE   Leukocytes, UA NEGATIVE  NEGATIVE  ETHANOL      Result Value Range   Alcohol, Ethyl (B) 189 (*) 0 - 11 mg/dL  CBC      Result Value Range   WBC 5.4  4.0 - 10.5 K/uL   RBC 3.77 (*) 4.22 - 5.81 MIL/uL   Hemoglobin 12.5 (*) 13.0 - 17.0 g/dL   HCT 36.1 (*) 39.0 - 52.0 %   MCV 95.8  78.0 - 100.0 fL   MCH 33.2  26.0 - 34.0 pg   MCHC 34.6  30.0 - 36.0 g/dL   RDW 13.8  11.5 - 15.5 %   Platelets 159  150 - 400 K/uL  BASIC METABOLIC PANEL      Result Value Range   Sodium 140  135 - 145 mEq/L   Potassium 4.8  3.5 - 5.1 mEq/L   Chloride 109  96 - 112 mEq/L   CO2 23  19 - 32 mEq/L   Glucose, Bld 106 (*) 70 - 99 mg/dL   BUN 16  6 - 23 mg/dL   Creatinine, Ser 1.07  0.50 - 1.35 mg/dL   Calcium 9.0  8.4 - 10.5 mg/dL   GFR calc non Af Amer 69 (*) >90 mL/min   GFR calc Af Amer 80 (*) >90 mL/min  URINE RAPID DRUG SCREEN (HOSP PERFORMED)      Result Value Range   Opiates NONE DETECTED  NONE DETECTED   Cocaine NONE DETECTED  NONE DETECTED   Benzodiazepines NONE DETECTED  NONE DETECTED   Amphetamines NONE DETECTED  NONE DETECTED   Tetrahydrocannabinol NONE DETECTED  NONE  DETECTED   Barbiturates NONE DETECTED  NONE DETECTED     Teressa Lower, MD 02/06/13 [phone]

## 2013-02-10 ENCOUNTER — Encounter: Payer: Self-pay | Admitting: Internal Medicine

## 2013-02-10 ENCOUNTER — Ambulatory Visit (INDEPENDENT_AMBULATORY_CARE_PROVIDER_SITE_OTHER): Payer: Medicare Other | Admitting: Internal Medicine

## 2013-02-10 ENCOUNTER — Ambulatory Visit: Payer: Medicare Other | Admitting: Internal Medicine

## 2013-02-10 VITALS — BP 169/100 | HR 94 | Temp 98.2°F | Wt 203.9 lb

## 2013-02-10 DIAGNOSIS — R32 Unspecified urinary incontinence: Secondary | ICD-10-CM | POA: Diagnosis not present

## 2013-02-10 DIAGNOSIS — I10 Essential (primary) hypertension: Secondary | ICD-10-CM | POA: Diagnosis not present

## 2013-02-10 DIAGNOSIS — R159 Full incontinence of feces: Secondary | ICD-10-CM

## 2013-02-10 MED ORDER — TERAZOSIN HCL 2 MG PO CAPS: 2.0000 mg | ORAL_CAPSULE | Freq: Every day | ORAL | Status: DC

## 2013-02-10 MED ORDER — OXYBUTYNIN CHLORIDE 5 MG PO TABS
5.0000 mg | ORAL_TABLET | Freq: Two times a day (BID) | ORAL | Status: DC
Start: 2013-02-10 — End: 2013-05-10

## 2013-02-10 NOTE — Assessment & Plan Note (Addendum)
BP Readings from Last 3 Encounters:  02/10/13 169/100  02/06/13 163/110  01/31/13 178/98    Lab Results  Component Value Date   NA 140 02/05/2013   K 4.8 02/05/2013   CREATININE 1.07 02/05/2013    Assessment: Blood pressure control: moderately elevated Progress toward BP goal:  improved Comments:   Plan: Medications:  see below Educational resources provided:   Self management tools provided:   Other plans:  Patient's blood pressure remains elevated. He is difficult to communicate with and compliance is questionable. Patient has a lot of psychosocial issues and I suspect some cognitive impairment.  Currently taking HCTZ 25mg , lisinopril 40mg .   -start terazosin for blood pressure as well as prostate issues -discussed potential side effects of orthostatic hypotension and cautioned patient to be careful when getting up -DC HCTZ, continue lisinopril -return to clinic in 2 weeks for BP check

## 2013-02-10 NOTE — Patient Instructions (Addendum)
It is very important that you stop drinking alcohol or decrease the amount that you are drinking. I think this is making the urinary incontinence worse. If you stop drinking, you will also have more money to pay for your medicines.  To decrease the cost of your medicines, we can change the oxybutynin to a different brand that you can take twice daily and this is less expensive. I sent a new prescription for this medicine to your pharmacy.  For your blood pressure, we will stop the HCTZ because this can make you urinate more. We will start a new medicine called Terazosin. This medicine will help with a urinary symptoms as well as your blood pressure. Please take this medication only at night. Also be cautious with getting up from sitting down or laying down at this medicine can cause you to be dizzy.  Please continue to take Prozac, as this will help with your mood and may help with interest in activities outside the house.

## 2013-02-10 NOTE — Assessment & Plan Note (Addendum)
Unclear etiology of the fecal incontinence. Patient will need a colonoscopy at some point, would recommend getting it this at the next visit, however, patient is noncompliant and an alcoholic so we would need to make sure that he actually goes to the appointment. Denies hematochezia or melena.   -will try terazosin for any component of overflow incontinence and prostate disease. Patient is having frequency, urgency, nocturia. This would also treat his blood pressure is well -will switch oxybutynin to generic rather than the 24 hour tablet, this will be less expensive for the patient as this was one of his and his daughter's complaints

## 2013-02-10 NOTE — Progress Notes (Signed)
Patient ID: William [name], male   DOB: March 22, 1946, 67 y.o.   MRN: [id number]  Internal Medicine Clinic Visit    HPI:  William [name] is a 67 y.o. year old male who comes in for several problems. He is hard to hearing and is difficult to communicate with. He often changes the subject without answering the questions.  Regarding his hypertension, he states that he is taking his medications as prescribed, though he will not go into much detail. He states that his medications are too expensive and that the price went up recently. His only new medication was oxybutynin so I believe it is this medication.  He admits to drinking 3 quarts per day of [employer] beer.  He lives with his granddaughter who wrote a note that the patient gives me stating that the patient drinks too much and that he often has urinary and fecal incontinence. She also inquires about the extra expense of his last prescriptions.   Past Medical History  Diagnosis Date  . Diabetes mellitus type II   . Hyperlipemia   . Anxiety   . Alcohol abuse   . Hearing impairment   . HTN (hypertension)   . Cataract   . Alcohol withdrawal seizure     No past surgical history on file.   ROS:  A complete review of systems was otherwise negative, except as noted in the HPI.  Allergies: Review of patient's allergies indicates no known allergies.  Medications: Current Outpatient Prescriptions  Medication Sig Dispense Refill  . FLUoxetine (PROZAC) 40 MG capsule Take 1 capsule (40 mg total) by mouth daily.  90 capsule  3  . folic acid (FOLVITE) 1 MG tablet Take 1 tablet (1 mg total) by mouth daily.  30 tablet  11  . hydrochlorothiazide (HYDRODIURIL) 25 MG tablet Take 1 tablet (25 mg total) by mouth daily.  90 tablet  3  . ibuprofen (ADVIL,MOTRIN) 200 MG tablet Take 200-800 mg by mouth every 6 (six) hours as needed. For pain      . lisinopril (PRINIVIL,ZESTRIL) 40 MG tablet TAKE 1 TABLET BY MOUTH DAILY  30 tablet  0  . loperamide  (IMODIUM) 2 MG capsule Take 2 mg by mouth 4 (four) times daily as needed. For diarrhea      . Multiple Vitamin (MULTIVITAMIN WITH MINERALS) TABS Take 1 tablet by mouth daily.  30 tablet  11  . oxybutynin (DITROPAN-XL) 5 MG 24 hr tablet Take 1 tablet (5 mg total) by mouth daily.  90 tablet  3  . simvastatin (ZOCOR) 20 MG tablet Take 1 tablet (20 mg total) by mouth at bedtime.  90 tablet  3  . thiamine 100 MG tablet Take 1 tablet (100 mg total) by mouth daily.  30 tablet  11   No current facility-administered medications for this visit.    History   Social History  . Marital Status: Single    Spouse Name: N/A    Number of Children: N/A  . Years of Education: N/A   Occupational History  . Not on file.   Social History Main Topics  . Smoking status: Current Every Day Smoker    Types: Cigars  . Smokeless tobacco: Current User    Types: Chew  . Alcohol Use: Yes     Comment: vodka--1/2 gal, heavy drinker x8 mos   . Drug Use: No  . Sexually Active: No   Other Topics Concern  . Not on file   Social History Narrative  .  No narrative on file    family history is not on file.  Physical Exam Blood pressure 169/100, pulse 94, temperature 98.2 F (36.8 C), temperature source Oral, weight 203 lb 14.4 oz (92.488 kg), SpO2 100.00%. General:  No acute distress, oriented to place only, slightly disheveled, uses a walker HEENT:  PERRL, EOMI, no lymphadenopathy, slightly dry mucous membranes, very hard of hearing Cardiovascular:  Regular rate and rhythm, no murmurs, rubs or gallops Respiratory:  Clear to auscultation bilaterally, no wheezes, rales, or rhonchi Abdomen:  Soft, nondistended, nontender, + bowel sounds Extremities:  Warm and well-perfused, no clubbing, cyanosis Skin: Warm, very dry, no rashes Neuro: Mild cognitive impairment, does not answer questions directly, changes the subject  Labs: Lab Results  Component Value Date   CREATININE 1.07 02/05/2013   BUN 16 02/05/2013    NA 140 02/05/2013   K 4.8 02/05/2013   CL 109 02/05/2013   CO2 23 02/05/2013   Lab Results  Component Value Date   WBC 5.4 02/05/2013   HGB 12.5* 02/05/2013   HCT 36.1* 02/05/2013   MCV 95.8 02/05/2013   PLT 159 02/05/2013      Assessment and Plan:  Plan was discussed with Dr. Eppie Gibson, attending physician.  FOLLOWUP: William [name] will follow back up in our clinic in approximately 2 weeks. William [name] knows to call our clinic in the meantime with any questions or new issues.

## 2013-02-15 NOTE — Progress Notes (Signed)
Case discussed with Dr. Sissy Hoff at the time of the visit.  We reviewed the resident's history and exam and pertinent patient test results.  I agree with the assessment, diagnosis and plan of care documented in the resident's note.

## 2013-03-25 ENCOUNTER — Encounter: Payer: Medicare Other | Admitting: Internal Medicine

## 2013-04-07 ENCOUNTER — Other Ambulatory Visit: Payer: Self-pay

## 2013-05-09 ENCOUNTER — Emergency Department (HOSPITAL_COMMUNITY): Payer: Medicare Other

## 2013-05-09 ENCOUNTER — Inpatient Hospital Stay (HOSPITAL_COMMUNITY)
Admission: EM | Admit: 2013-05-09 | Discharge: 2013-05-11 | DRG: 897 | Disposition: A | Discharge status: Home Health | Payer: Medicare Other | Attending: Internal Medicine | Admitting: Internal Medicine

## 2013-05-09 ENCOUNTER — Encounter (HOSPITAL_COMMUNITY): Payer: Self-pay | Admitting: Emergency Medicine

## 2013-05-09 DIAGNOSIS — G3184 Mild cognitive impairment, so stated: Secondary | ICD-10-CM | POA: Diagnosis present

## 2013-05-09 DIAGNOSIS — F10229 Alcohol dependence with intoxication, unspecified: Principal | ICD-10-CM | POA: Diagnosis present

## 2013-05-09 DIAGNOSIS — R32 Unspecified urinary incontinence: Secondary | ICD-10-CM | POA: Diagnosis present

## 2013-05-09 DIAGNOSIS — E119 Type 2 diabetes mellitus without complications: Secondary | ICD-10-CM

## 2013-05-09 DIAGNOSIS — R159 Full incontinence of feces: Secondary | ICD-10-CM

## 2013-05-09 DIAGNOSIS — R55 Syncope and collapse: Secondary | ICD-10-CM | POA: Diagnosis present

## 2013-05-09 DIAGNOSIS — F17200 Nicotine dependence, unspecified, uncomplicated: Secondary | ICD-10-CM | POA: Diagnosis present

## 2013-05-09 DIAGNOSIS — I10 Essential (primary) hypertension: Secondary | ICD-10-CM | POA: Diagnosis present

## 2013-05-09 DIAGNOSIS — F10220 Alcohol dependence with intoxication, uncomplicated: Secondary | ICD-10-CM

## 2013-05-09 DIAGNOSIS — E785 Hyperlipidemia, unspecified: Secondary | ICD-10-CM

## 2013-05-09 DIAGNOSIS — R6889 Other general symptoms and signs: Secondary | ICD-10-CM | POA: Diagnosis not present

## 2013-05-09 DIAGNOSIS — R918 Other nonspecific abnormal finding of lung field: Secondary | ICD-10-CM | POA: Diagnosis not present

## 2013-05-09 DIAGNOSIS — H9190 Unspecified hearing loss, unspecified ear: Secondary | ICD-10-CM

## 2013-05-09 DIAGNOSIS — R4182 Altered mental status, unspecified: Secondary | ICD-10-CM

## 2013-05-09 DIAGNOSIS — I951 Orthostatic hypotension: Secondary | ICD-10-CM

## 2013-05-09 DIAGNOSIS — R51 Headache: Secondary | ICD-10-CM | POA: Diagnosis not present

## 2013-05-09 LAB — CBC WITH DIFFERENTIAL/PLATELET
Basophils Absolute: 0 10*3/uL (ref 0.0–0.1)
Basophils Relative: 0 % (ref 0–1)
Eosinophils Absolute: 0 10*3/uL (ref 0.0–0.7)
Eosinophils Relative: 0 % (ref 0–5)
HCT: 36.4 % — ABNORMAL LOW (ref 39.0–52.0)
Hemoglobin: 12.9 g/dL — ABNORMAL LOW (ref 13.0–17.0)
Lymphocytes Relative: 6 % — ABNORMAL LOW (ref 12–46)
Lymphs Abs: 0.6 10*3/uL — ABNORMAL LOW (ref 0.7–4.0)
MCH: 33.8 pg (ref 26.0–34.0)
MCHC: 35.4 g/dL (ref 30.0–36.0)
MCV: 95.3 fL (ref 78.0–100.0)
Monocytes Absolute: 0.5 10*3/uL (ref 0.1–1.0)
Monocytes Relative: 5 % (ref 3–12)
Neutro Abs: 8.6 10*3/uL — ABNORMAL HIGH (ref 1.7–7.7)
Neutrophils Relative %: 89 % — ABNORMAL HIGH (ref 43–77)
Platelets: 133 10*3/uL — ABNORMAL LOW (ref 150–400)
RBC: 3.82 MIL/uL — ABNORMAL LOW (ref 4.22–5.81)
RDW: 12.9 % (ref 11.5–15.5)
WBC: 9.6 10*3/uL (ref 4.0–10.5)

## 2013-05-09 LAB — COMPREHENSIVE METABOLIC PANEL WITH GFR
ALT: 19 U/L (ref 0–53)
AST: 32 U/L (ref 0–37)
Albumin: 3.8 g/dL (ref 3.5–5.2)
Alkaline Phosphatase: 67 U/L (ref 39–117)
BUN: 8 mg/dL (ref 6–23)
CO2: 24 meq/L (ref 19–32)
Calcium: 9.5 mg/dL (ref 8.4–10.5)
Chloride: 100 meq/L (ref 96–112)
Creatinine, Ser: 1.05 mg/dL (ref 0.50–1.35)
GFR calc Af Amer: 82 mL/min — ABNORMAL LOW
GFR calc non Af Amer: 70 mL/min — ABNORMAL LOW
Glucose, Bld: 115 mg/dL — ABNORMAL HIGH (ref 70–99)
Potassium: 3.8 meq/L (ref 3.5–5.1)
Sodium: 138 meq/L (ref 135–145)
Total Bilirubin: 0.8 mg/dL (ref 0.3–1.2)
Total Protein: 6.6 g/dL (ref 6.0–8.3)

## 2013-05-09 LAB — TROPONIN I: Troponin I: <0.3 ng/mL

## 2013-05-09 LAB — POCT I-STAT TROPONIN I: Troponin i, poc: 0.1 ng/mL (ref 0.00–0.08)

## 2013-05-09 LAB — MAGNESIUM: Magnesium: 1.5 mg/dL (ref 1.5–2.5)

## 2013-05-09 NOTE — ED Provider Notes (Signed)
I saw and evaluated the patient, reviewed the resident's note and I agree with the findings and plan.  Results for orders placed during the hospital encounter of 05/09/13  CBC WITH DIFFERENTIAL      Result Value Range   WBC 9.6  4.0 - 10.5 K/uL   RBC 3.82 (*) 4.22 - 5.81 MIL/uL   Hemoglobin 12.9 (*) 13.0 - 17.0 g/dL   HCT 36.4 (*) 39.0 - 52.0 %   MCV 95.3  78.0 - 100.0 fL   MCH 33.8  26.0 - 34.0 pg   MCHC 35.4  30.0 - 36.0 g/dL   RDW 12.9  11.5 - 15.5 %   Platelets 133 (*) 150 - 400 K/uL   Neutrophils Relative % 89 (*) 43 - 77 %   Neutro Abs 8.6 (*) 1.7 - 7.7 K/uL   Lymphocytes Relative 6 (*) 12 - 46 %   Lymphs Abs 0.6 (*) 0.7 - 4.0 K/uL   Monocytes Relative 5  3 - 12 %   Monocytes Absolute 0.5  0.1 - 1.0 K/uL   Eosinophils Relative 0  0 - 5 %   Eosinophils Absolute 0.0  0.0 - 0.7 K/uL   Basophils Relative 0  0 - 1 %   Basophils Absolute 0.0  0.0 - 0.1 K/uL  COMPREHENSIVE METABOLIC PANEL      Result Value Range   Sodium 138  135 - 145 mEq/L   Potassium 3.8  3.5 - 5.1 mEq/L   Chloride 100  96 - 112 mEq/L   CO2 24  19 - 32 mEq/L   Glucose, Bld 115 (*) 70 - 99 mg/dL   BUN 8  6 - 23 mg/dL   Creatinine, Ser 1.05  0.50 - 1.35 mg/dL   Calcium 9.5  8.4 - 10.5 mg/dL   Total Protein 6.6  6.0 - 8.3 g/dL   Albumin 3.8  3.5 - 5.2 g/dL   AST 32  0 - 37 U/L   ALT 19  0 - 53 U/L   Alkaline Phosphatase 67  39 - 117 U/L   Total Bilirubin 0.8  0.3 - 1.2 mg/dL   GFR calc non Af Amer 70 (*) >90 mL/min   GFR calc Af Amer 82 (*) >90 mL/min  ETHANOL      Result Value Range   Alcohol, Ethyl (B) <11  0 - 11 mg/dL  MAGNESIUM      Result Value Range   Magnesium 1.5  1.5 - 2.5 mg/dL  POCT I-STAT TROPONIN I      Result Value Range   Troponin i, poc 0.10 (*) 0.00 - 0.08 ng/mL   Comment NOTIFIED PHYSICIAN     Comment 3            Ct Head Wo Contrast  05/09/2013   *RADIOLOGY REPORT*  Clinical Data: Altered mental status.  History of fall.  Confusion.  CT HEAD WITHOUT CONTRAST  Technique:   Contiguous axial images were obtained from the base of the skull through the vertex without contrast.  Comparison: Head CT 09/02/2012.  Findings: Moderate cerebral and cerebellar atrophy.  Extensive patchy and confluent areas of decreased attenuation throughout the deep and periventricular white matter of the cerebral hemispheres bilaterally, compatible with chronic microvascular ischemic disease. No acute intracranial abnormalities.  Specifically, no evidence of acute intracranial hemorrhage, no definite findings of acute/subacute cerebral ischemia, no mass, mass effect, hydrocephalus or abnormal intra or extra-axial fluid collections. Visualized paranasal sinuses are remarkable for mucoperiosteal thickening in the  right frontal sinus, frontoethmoidal recess and right maxillary sinus (right maxillary sinus is completely opacified).  There is also a small left mastoid effusion.  IMPRESSION: 1.  No acute intracranial abnormalities. 2.  Moderate cerebral and cerebellar atrophy with extensive chronic microvascular ischemic changes in the cerebral white matter, as above. 3.  Changes of chronic sinusitis in the right frontal, ethmoidal and maxillary sinuses. 4.  Small left mastoid effusion.   Original Report Authenticated By: Vinnie Langton, M.D.    Patient seen by me. Patient's point of care troponin with a slight elevation of 0.1 Regular troponin will be ordered. Patient without any complaint of chest pain. Patient has a history of being heavy drinker in the past known to outpatient clinics. Patient's alcohol level here today is normal. Patient as per neighbor of was seen falling on the porch and crawling back into the house. Patient here is very confused with altered mental status but alert has urinated on the floor in the room. Patient will require admission. Patient lives by himself currently daughter was removed moved from the house by the landlord sometime in the last 2 days. Outpatient clinics will  evaluate.  Actually EKG without acute changes. Shows evidence of a possible old anterior septal infarct or nonspecific T waves. No acute changes concur with the resident's read his EKG.  Mervin Kung, MD 05/09/13 [phone]

## 2013-05-09 NOTE — ED Provider Notes (Signed)
CSN: [id number]     Arrival date & time 05/09/13  1943 History     First MD Initiated Contact with Patient 05/09/13 2006     Chief Complaint  Patient presents with  . Altered Mental Status   (Consider location/radiation/quality/duration/timing/severity/associated sxs/prior Treatment) The history is provided by the EMS personnel and the patient. The history is limited by the condition of the patient.   67 year old male with a history of diabetes, hyperlipidemia, alcohol abuse, hypertension, and medication nonadherence presents via EMS for conservative witness fall by a neighbor and altered mental status. Per EMS, it neighbor had witnessed patient fall and probably a. No reported loss of consciousness or seizure-like activity. Patient oriented only to self and found in bed on EMS arrival. Patient had previously been living with his granddaughter, however she recently moved out and he is now living alone. Patient reports he is here because he "passed out" confrontive the [employer], and then names off several other fast food restaurants. Denies any chest pain, shortness of breath. Reports mild headache. Patient denies any other neurologic symptoms, fever, or abdominal pain.  Past Medical History  Diagnosis Date  . Diabetes mellitus type II   . Hyperlipemia   . Anxiety   . Alcohol abuse   . Hearing impairment   . HTN (hypertension)   . Cataract   . Alcohol withdrawal seizure    History reviewed. No pertinent past surgical history. History reviewed. No pertinent family history. History  Substance Use Topics  . Smoking status: Current Every Day Smoker    Types: Cigars  . Smokeless tobacco: Current User    Types: Chew  . Alcohol Use: Yes     Comment: vodka--1/2 gal, heavy drinker x8 mos     Review of Systems  Constitutional: Negative for fever.  HENT: Negative for sore throat and neck pain.   Eyes: Negative for visual disturbance.  Respiratory: Negative for cough and  shortness of breath.   Cardiovascular: Negative for chest pain.  Gastrointestinal: Negative for nausea, vomiting and abdominal pain.  Genitourinary: Negative for difficulty urinating.  Musculoskeletal: Negative for back pain.  Skin: Negative for rash.  Neurological: Positive for headaches. Negative for syncope.    Allergies  Review of patient's allergies indicates no known allergies.  Home Medications   Current Outpatient Rx  Name  Route  Sig  Dispense  Refill  . FLUoxetine (PROZAC) 40 MG capsule   Oral   Take 1 capsule (40 mg total) by mouth daily.   90 capsule   3   . folic acid (FOLVITE) 1 MG tablet   Oral   Take 1 tablet (1 mg total) by mouth daily.   30 tablet   11   . ibuprofen (ADVIL,MOTRIN) 200 MG tablet   Oral   Take 200-800 mg by mouth every 6 (six) hours as needed. For pain         . lisinopril (PRINIVIL,ZESTRIL) 40 MG tablet      TAKE 1 TABLET BY MOUTH DAILY   30 tablet   0   . Multiple Vitamin (MULTIVITAMIN WITH MINERALS) TABS   Oral   Take 1 tablet by mouth daily.   30 tablet   11   . oxybutynin (DITROPAN) 5 MG tablet   Oral   Take 1 tablet (5 mg total) by mouth 2 (two) times daily.   30 tablet   2   . simvastatin (ZOCOR) 20 MG tablet   Oral   Take 1 tablet (20  mg total) by mouth at bedtime.   90 tablet   3   . terazosin (HYTRIN) 2 MG capsule   Oral   Take 1 capsule (2 mg total) by mouth at bedtime.   30 capsule   4   . thiamine 100 MG tablet   Oral   Take 1 tablet (100 mg total) by mouth daily.   30 tablet   11    BP 164/96  Pulse 71  Temp(Src) 98.4 F (36.9 C) (Oral)  Resp 16  SpO2 98% Physical Exam  Nursing note and vitals reviewed. Constitutional: He is oriented to person, place, and time. He appears well-developed. He appears ill. No distress.  HENT:  Head: Normocephalic and atraumatic.  Mouth/Throat: Mucous membranes are dry. No lacerations. No oropharyngeal exudate.  Eyes: Conjunctivae and EOM are normal. Pupils  are equal, round, and reactive to light.  Neck: Normal range of motion.  Cardiovascular: Normal rate, regular rhythm, normal heart sounds and intact distal pulses.  Exam reveals no gallop and no friction rub.   No murmur heard. Pulmonary/Chest: Effort normal and breath sounds normal. No respiratory distress. He has no wheezes. He has no rales.  Abdominal: Soft. He exhibits no distension. There is no tenderness. There is no guarding.  Musculoskeletal: He exhibits no edema and no tenderness.       Cervical back: He exhibits no tenderness and no bony tenderness.       Thoracic back: He exhibits no tenderness and no bony tenderness.       Lumbar back: He exhibits no tenderness and no bony tenderness.  Neurological: He is alert and oriented to person, place, and time. No cranial nerve deficit or sensory deficit. GCS eye subscore is 4. GCS verbal subscore is 4. GCS motor subscore is 6.  Skin: Skin is warm and dry. He is not diaphoretic.    ED Course   Procedures (including critical care time)  Labs Reviewed  CBC WITH DIFFERENTIAL - Abnormal; Notable for the following:    RBC 3.82 (*)    Hemoglobin 12.9 (*)    HCT 36.4 (*)    Platelets 133 (*)    Neutrophils Relative % 89 (*)    Neutro Abs 8.6 (*)    Lymphocytes Relative 6 (*)    Lymphs Abs 0.6 (*)    All other components within normal limits  COMPREHENSIVE METABOLIC PANEL - Abnormal; Notable for the following:    Glucose, Bld 115 (*)    GFR calc non Af Amer 70 (*)    GFR calc Af Amer 82 (*)    All other components within normal limits  URINALYSIS, ROUTINE W REFLEX MICROSCOPIC - Abnormal; Notable for the following:    Glucose, UA 500 (*)    Hgb urine dipstick SMALL (*)    All other components within normal limits  URINE MICROSCOPIC-ADD ON - Abnormal; Notable for the following:    Casts HYALINE CASTS (*)    All other components within normal limits  POCT I-STAT TROPONIN I - Abnormal; Notable for the following:    Troponin i, poc  0.10 (*)    All other components within normal limits  URINE CULTURE  ETHANOL  URINE RAPID DRUG SCREEN (HOSP PERFORMED)  MAGNESIUM  TROPONIN I  AMMONIA  ALCOHOL, METHYL (METHANOL), BLOOD  ETHYLENE GLYCOL  ALCOHOL,  ISOPROPYL (ISOPROPANOL)  SALICYLATE LEVEL  ACETAMINOPHEN LEVEL   Dg Chest 2 View  05/09/2013   *RADIOLOGY REPORT*  Clinical Data: Altered mental status.  CHEST -  2 VIEW  Comparison: Chest x-ray 08/20/2012.  Findings: Lung volumes are low.  No acute consolidative airspace disease.  No pleural effusions.  Pulmonary vasculature is within normal limits.  Heart size is mildly enlarged. The patient is rotated to the right on today's exam, resulting in distortion of the mediastinal contours and reduced diagnostic sensitivity and specificity for mediastinal pathology.  Atherosclerosis of the thoracic aorta.  Old healed fracture of the posterolateral aspect of the left sixth rib incidentally noted. The lateral view demonstrates a compression fracture of a mid-thoracic vertebral body (likely T8), as well as an upper lumbar vertebral body (likely L2).  IMPRESSION: 1.  No radiographic evidence of acute cardiopulmonary disease. 2.  Mild cardiomegaly. 3.  Atherosclerosis. 4.  Age indeterminate compression fractures in the mid thoracic and upper lumbar spine, as above.   Original Report Authenticated By: Vinnie Langton, M.D.   Ct Head Wo Contrast  05/09/2013   *RADIOLOGY REPORT*  Clinical Data: Altered mental status.  History of fall.  Confusion.  CT HEAD WITHOUT CONTRAST  Technique:  Contiguous axial images were obtained from the base of the skull through the vertex without contrast.  Comparison: Head CT 09/02/2012.  Findings: Moderate cerebral and cerebellar atrophy.  Extensive patchy and confluent areas of decreased attenuation throughout the deep and periventricular white matter of the cerebral hemispheres bilaterally, compatible with chronic microvascular ischemic disease. No acute intracranial  abnormalities.  Specifically, no evidence of acute intracranial hemorrhage, no definite findings of acute/subacute cerebral ischemia, no mass, mass effect, hydrocephalus or abnormal intra or extra-axial fluid collections. Visualized paranasal sinuses are remarkable for mucoperiosteal thickening in the right frontal sinus, frontoethmoidal recess and right maxillary sinus (right maxillary sinus is completely opacified).  There is also a small left mastoid effusion.  IMPRESSION: 1.  No acute intracranial abnormalities. 2.  Moderate cerebral and cerebellar atrophy with extensive chronic microvascular ischemic changes in the cerebral white matter, as above. 3.  Changes of chronic sinusitis in the right frontal, ethmoidal and maxillary sinuses. 4.  Small left mastoid effusion.   Original Report Authenticated By: Vinnie Langton, M.D.   1. Altered mental status     Date: 05/10/2013  Rate: 38  Rhythm: normal sinus rhythm  QRS Axis: normal  Intervals: normal  ST/T Wave abnormalities: nonspecific T wave changes  Conduction Disutrbances:none  Narrative Interpretation:   Old EKG Reviewed: changes noted, nonspecific twave abnormalities lateral leads   MDM  67 year old male with a history of diabetes, hyperlipidemia, alcohol abuse, hypertension, and medication nonadherence presents via EMS for conservative witness fall by a neighbor and altered mental status. History is limited by patient's condition, report is that this was a fall without loss of consciousness, patient immediately crawling away. No reported seizure-like activity, and patient does not exhibit signs on exam to suggest this. The patient is oriented to self, alert, appropriately answers questions at times but at times is tangential. Patient also urinating around room. Patient appears intoxicated however ethanol level is undetectable. CMP and magnesium did not reveal electrolyte abnormality that could be a cause of patient's mental status.  I-STAT  troponin was elevated, however recheck troponin was negative, and have low suspicion for acute cardiac disease as cause of patient's symptoms as he denies any chest pain or shortness of breath. EKG shows no acute changes to indicate ischemia. CBC shows left shift, however no leukocytosis. Urinalysis shows no signs of urinary tract infection. Chest x-ray shows no signs of pneumonia and age indeterminant compression fractures of the midthoracic  and upper lumbar spine, with patient with no tenderness on exam. CT head showed no acute intracranial abnormalities, moderate cerebral and cerebellar atrophy with chronic microvascular ischemic changes. Internal medicine was consulted to admit the patient given his altered mental mental status, inappropriate behavior including urinating on the floor inpatient who lives alone. Will do MRI brain to evaluate for more acute stroke, however patient's exam is nonfocal.  Alvino Chapel, MD 05/10/13 [phone]

## 2013-05-09 NOTE — ED Notes (Signed)
[facility] presents with a 67 yo male from home with altered mental status.  [facility] was called by a neighbor stating that the patient had fallen; pt doesn't remember falling or how he got in bed.  No pain reported by pt and pt ambulated to emergency vehicle for transport.  Disoriented/only alert and oriented to self. Negative stroke screen by [facility] and denies ETOH consumption.

## 2013-05-10 ENCOUNTER — Encounter (HOSPITAL_COMMUNITY): Payer: Self-pay | Admitting: *Deleted

## 2013-05-10 ENCOUNTER — Inpatient Hospital Stay (HOSPITAL_COMMUNITY): Payer: Medicare Other

## 2013-05-10 DIAGNOSIS — R159 Full incontinence of feces: Secondary | ICD-10-CM

## 2013-05-10 DIAGNOSIS — R4182 Altered mental status, unspecified: Secondary | ICD-10-CM

## 2013-05-10 DIAGNOSIS — H9190 Unspecified hearing loss, unspecified ear: Secondary | ICD-10-CM | POA: Diagnosis not present

## 2013-05-10 DIAGNOSIS — S0990XA Unspecified injury of head, initial encounter: Secondary | ICD-10-CM | POA: Diagnosis not present

## 2013-05-10 DIAGNOSIS — E119 Type 2 diabetes mellitus without complications: Secondary | ICD-10-CM

## 2013-05-10 DIAGNOSIS — F10229 Alcohol dependence with intoxication, unspecified: Secondary | ICD-10-CM

## 2013-05-10 DIAGNOSIS — R32 Unspecified urinary incontinence: Secondary | ICD-10-CM | POA: Diagnosis not present

## 2013-05-10 DIAGNOSIS — I951 Orthostatic hypotension: Secondary | ICD-10-CM

## 2013-05-10 LAB — TSH: TSH: 3.661 u[IU]/mL (ref 0.350–4.500)

## 2013-05-10 LAB — ALCOHOL,  ISOPROPYL (ISOPROPANOL): Isopropanol: NEGATIVE

## 2013-05-10 LAB — RAPID URINE DRUG SCREEN, HOSP PERFORMED
Amphetamines: NOT DETECTED
Barbiturates: NOT DETECTED
Benzodiazepines: NOT DETECTED
Cocaine: NOT DETECTED

## 2013-05-10 LAB — URINALYSIS, ROUTINE W REFLEX MICROSCOPIC
Bilirubin Urine: NEGATIVE
Glucose, UA: 500 mg/dL — AB
Leukocytes, UA: NEGATIVE
Nitrite: NEGATIVE
Specific Gravity, Urine: 1.013 (ref 1.005–1.030)
pH: 6 (ref 5.0–8.0)

## 2013-05-10 LAB — BASIC METABOLIC PANEL
BUN: 8 mg/dL (ref 6–23)
Calcium: 9.2 mg/dL (ref 8.4–10.5)
GFR calc non Af Amer: 73 mL/min — ABNORMAL LOW (ref >90)
Glucose, Bld: 103 mg/dL — ABNORMAL HIGH (ref 70–99)
Sodium: 137 mEq/L (ref 135–145)

## 2013-05-10 LAB — CBC
MCH: 33.4 pg (ref 26.0–34.0)
Platelets: 135 10*3/uL — ABNORMAL LOW (ref 150–400)
RBC: 3.83 MIL/uL — ABNORMAL LOW (ref 4.22–5.81)

## 2013-05-10 LAB — ALCOHOL, METHYL (METHANOL), BLOOD: Methanol Lvl: NEGATIVE

## 2013-05-10 LAB — URINE MICROSCOPIC-ADD ON

## 2013-05-10 LAB — ETHYLENE GLYCOL: Ethylene Glycol Lvl: <5 mg/dL (ref <5)

## 2013-05-10 LAB — GLUCOSE, CAPILLARY: Glucose-Capillary: 105 mg/dL — ABNORMAL HIGH (ref 70–99)

## 2013-05-10 MED ORDER — THIAMINE HCL 100 MG/ML IJ SOLN
Freq: Once | INTRAVENOUS | Status: AC
  Administered 2013-05-10: 04:00:00 via INTRAVENOUS
  Filled 2013-05-10: qty 1000

## 2013-05-10 MED ORDER — SODIUM CHLORIDE 0.9 % IV SOLN: INTRAVENOUS | Status: DC

## 2013-05-10 MED ORDER — HEPARIN SODIUM (PORCINE) 5000 UNIT/ML IJ SOLN
5000.0000 [IU] | Freq: Three times a day (TID) | INTRAMUSCULAR | Status: DC
  Administered 2013-05-10 – 2013-05-11 (×4): 5000 [IU] via SUBCUTANEOUS
  Filled 2013-05-10 (×7): qty 1

## 2013-05-10 MED ORDER — GADOBENATE DIMEGLUMINE 529 MG/ML IV SOLN
15.0000 mL | Freq: Once | INTRAVENOUS | Status: AC
  Administered 2013-05-10: 15 mL via INTRAVENOUS

## 2013-05-10 MED ORDER — LORAZEPAM 2 MG/ML IJ SOLN
1.0000 mg | Freq: Four times a day (QID) | INTRAMUSCULAR | Status: DC | PRN
Start: 2013-05-10 — End: 2013-05-11

## 2013-05-10 MED ORDER — SODIUM CHLORIDE 0.9 % IJ SOLN
3.0000 mL | Freq: Two times a day (BID) | INTRAMUSCULAR | Status: DC
  Administered 2013-05-10: 3 mL via INTRAVENOUS

## 2013-05-10 MED ORDER — ONDANSETRON HCL 4 MG/2ML IJ SOLN: 4.0000 mg | Freq: Four times a day (QID) | INTRAMUSCULAR | Status: DC | PRN

## 2013-05-10 MED ORDER — TRAMADOL HCL 50 MG PO TABS
100.0000 mg | ORAL_TABLET | Freq: Four times a day (QID) | ORAL | Status: DC | PRN
  Administered 2013-05-10 – 2013-05-11 (×3): 100 mg via ORAL
  Filled 2013-05-10: qty 2
  Filled 2013-05-10: qty 1
  Filled 2013-05-10 (×2): qty 2

## 2013-05-10 MED ORDER — INSULIN ASPART 100 UNIT/ML ~~LOC~~ SOLN
0.0000 [IU] | SUBCUTANEOUS | Status: DC
  Administered 2013-05-10: 1 [IU] via SUBCUTANEOUS
  Administered 2013-05-10: 3 [IU] via SUBCUTANEOUS
  Administered 2013-05-11: 1 [IU] via SUBCUTANEOUS
  Administered 2013-05-11: 2 [IU] via SUBCUTANEOUS
  Administered 2013-05-11: 1 [IU] via SUBCUTANEOUS

## 2013-05-10 MED ORDER — ONDANSETRON HCL 4 MG PO TABS: 4.0000 mg | ORAL_TABLET | Freq: Four times a day (QID) | ORAL | Status: DC | PRN

## 2013-05-10 NOTE — H&P (Signed)
Date: 05/10/2013  Patient name: William [name]  Medical record number: [id number]  Date of birth: 1946/03/03   I have seen and evaluated William [name] and discussed their care with the Residency Team.  [phone] M with alcohol dependence presents with ground level fall with abrasian to knees and possible loss of consciousness who admits to drinking excessively after family argument. Interestingly no detectable BAL.  Physical Exam: Blood pressure 137/101, pulse 108, temperature 98.3 F (36.8 C), temperature source Oral, resp. rate 22, height 5\' 8"  (1.727 m), weight 177 lb 11.2 oz (80.604 kg), SpO2 98.00%. Physical Exam  Constitutional: He is well-developed, well-nourished, and in no distress. No distress.  Appears disheveled. HENT:  Head: Normocephalic and atraumatic.  Right Ear: Decreased hearing is noted.  Left Ear: Decreased hearing is noted.  Eyes: Conjunctivae and EOM are normal. Pupils are equal, round, and reactive to light.  Neck: Normal range of motion. Neck supple.  Cardiovascular: Normal rate, regular rhythm, normal heart sounds and intact distal pulses. Exam reveals no gallop and no friction rub.  No murmur heard.  Pulmonary/Chest: Effort normal and breath sounds normal. He has no wheezes. He has no rales.  Abdominal: Soft. There is no tenderness.  Musculoskeletal: Normal range of motion. He exhibits no edema and no tenderness.  Neurological: He has normal sensation, normal strength, normal reflexes and intact cranial nerves. He is not agitated. He displays no weakness and no tremor.  [facility] but not time. Cranial nerves, motor exam, sensory exam all grossly intact.  Skin: Skin is warm and dry.  Some mild bruising on BL knees.  Psychiatric: repeats answers, somewhat tangential responses   Lab results: Results for orders placed during the hospital encounter of 05/09/13 (from the past 24 hour(s))  CBC WITH DIFFERENTIAL     Status:  Abnormal   Collection Time    05/09/13  9:51 PM      Result Value Range   WBC 9.6  4.0 - 10.5 K/uL   RBC 3.82 (*) 4.22 - 5.81 MIL/uL   Hemoglobin 12.9 (*) 13.0 - 17.0 g/dL   HCT 36.4 (*) 39.0 - 52.0 %   MCV 95.3  78.0 - 100.0 fL   MCH 33.8  26.0 - 34.0 pg   MCHC 35.4  30.0 - 36.0 g/dL   RDW 12.9  11.5 - 15.5 %   Platelets 133 (*) 150 - 400 K/uL   Neutrophils Relative % 89 (*) 43 - 77 %   Neutro Abs 8.6 (*) 1.7 - 7.7 K/uL   Lymphocytes Relative 6 (*) 12 - 46 %   Lymphs Abs 0.6 (*) 0.7 - 4.0 K/uL   Monocytes Relative 5  3 - 12 %   Monocytes Absolute 0.5  0.1 - 1.0 K/uL   Eosinophils Relative 0  0 - 5 %   Eosinophils Absolute 0.0  0.0 - 0.7 K/uL   Basophils Relative 0  0 - 1 %   Basophils Absolute 0.0  0.0 - 0.1 K/uL  COMPREHENSIVE METABOLIC PANEL     Status: Abnormal   Collection Time    05/09/13  9:51 PM      Result Value Range   Sodium 138  135 - 145 mEq/L   Potassium 3.8  3.5 - 5.1 mEq/L   Chloride 100  96 - 112 mEq/L   CO2 24  19 - 32 mEq/L   Glucose, Bld 115 (*) 70 - 99 mg/dL   BUN 8  6 -  23 mg/dL   Creatinine, Ser 1.05  0.50 - 1.35 mg/dL   Calcium 9.5  8.4 - 10.5 mg/dL   Total Protein 6.6  6.0 - 8.3 g/dL   Albumin 3.8  3.5 - 5.2 g/dL   AST 32  0 - 37 U/L   ALT 19  0 - 53 U/L   Alkaline Phosphatase 67  39 - 117 U/L   Total Bilirubin 0.8  0.3 - 1.2 mg/dL   GFR calc non Af Amer 70 (*) >90 mL/min   GFR calc Af Amer 82 (*) >90 mL/min  ETHANOL     Status: None   Collection Time    05/09/13  9:51 PM      Result Value Range   Alcohol, Ethyl (B) <11  0 - 11 mg/dL  MAGNESIUM     Status: None   Collection Time    05/09/13  9:51 PM      Result Value Range   Magnesium 1.5  1.5 - 2.5 mg/dL  POCT I-STAT TROPONIN I     Status: Abnormal   Collection Time    05/09/13  9:58 PM      Result Value Range   Troponin i, poc 0.10 (*) 0.00 - 0.08 ng/mL   Comment NOTIFIED PHYSICIAN     Comment 3           TROPONIN I     Status: None   Collection Time    05/09/13 11:17 PM       Result Value Range   Troponin I <0.30  <0.30 ng/mL  URINE RAPID DRUG SCREEN (HOSP PERFORMED)     Status: None   Collection Time    05/09/13 11:37 PM      Result Value Range   Opiates NONE DETECTED  NONE DETECTED   Cocaine NONE DETECTED  NONE DETECTED   Benzodiazepines NONE DETECTED  NONE DETECTED   Amphetamines NONE DETECTED  NONE DETECTED   Tetrahydrocannabinol NONE DETECTED  NONE DETECTED   Barbiturates NONE DETECTED  NONE DETECTED  URINALYSIS, ROUTINE W REFLEX MICROSCOPIC     Status: Abnormal   Collection Time    05/09/13 11:37 PM      Result Value Range   Color, Urine YELLOW  YELLOW   APPearance CLEAR  CLEAR   Specific Gravity, Urine 1.013  1.005 - 1.030   pH 6.0  5.0 - 8.0   Glucose, UA 500 (*) NEGATIVE mg/dL   Hgb urine dipstick SMALL (*) NEGATIVE   Bilirubin Urine NEGATIVE  NEGATIVE   Ketones, ur NEGATIVE  NEGATIVE mg/dL   Protein, ur NEGATIVE  NEGATIVE mg/dL   Urobilinogen, UA 0.2  0.0 - 1.0 mg/dL   Nitrite NEGATIVE  NEGATIVE   Leukocytes, UA NEGATIVE  NEGATIVE  URINE MICROSCOPIC-ADD ON     Status: Abnormal   Collection Time    05/09/13 11:37 PM      Result Value Range   Squamous Epithelial / LPF RARE  RARE   WBC, UA 0-2  <3 WBC/hpf   RBC / HPF 0-2  <3 RBC/hpf   Casts HYALINE CASTS (*) NEGATIVE  AMMONIA     Status: None   Collection Time    05/10/13  1:47 AM      Result Value Range   Ammonia 22  11 - 60 umol/L  ALCOHOL, METHYL (METHANOL), BLOOD     Status: None   Collection Time    05/10/13  1:47 AM      Result Value Range  Methanol Lvl NEGATIVE    ETHYLENE GLYCOL     Status: None   Collection Time    05/10/13  1:47 AM      Result Value Range   Ethylene Glycol Lvl <5  <5 mg/dL  ALCOHOL,  ISOPROPYL (ISOPROPANOL)     Status: None   Collection Time    05/10/13  1:47 AM      Result Value Range   Isopropanol NEGATIVE     Acetone NEGATIVE    SALICYLATE LEVEL     Status: Abnormal   Collection Time    05/10/13  1:47 AM      Result Value Range   Salicylate  Lvl [id number] (*) 2.8 - 20.0 mg/dL  ACETAMINOPHEN LEVEL     Status: None   Collection Time    05/10/13  1:47 AM      Result Value Range   Acetaminophen (Tylenol), Serum <15.0  10 - 30 ug/mL  GLUCOSE, CAPILLARY     Status: None   Collection Time    05/10/13  3:18 AM      Result Value Range   Glucose-Capillary 94  70 - 99 mg/dL   Comment 1 Notify RN    CBC     Status: Abnormal   Collection Time    05/10/13  4:45 AM      Result Value Range   WBC 5.8  4.0 - 10.5 K/uL   RBC 3.83 (*) 4.22 - 5.81 MIL/uL   Hemoglobin 12.8 (*) 13.0 - 17.0 g/dL   HCT 36.6 (*) 39.0 - 52.0 %   MCV 95.6  78.0 - 100.0 fL   MCH 33.4  26.0 - 34.0 pg   MCHC 35.0  30.0 - 36.0 g/dL   RDW 12.8  11.5 - 15.5 %   Platelets 135 (*) 150 - 400 K/uL  TSH     Status: None   Collection Time    05/10/13  4:45 AM      Result Value Range   TSH 3.661  0.350 - 4.500 uIU/mL  HEMOGLOBIN A1C     Status: Abnormal   Collection Time    05/10/13  4:45 AM      Result Value Range   Hemoglobin A1C 5.8 (*) <5.7 %   Mean Plasma Glucose 120 (*) <117 mg/dL  BASIC METABOLIC PANEL     Status: Abnormal   Collection Time    05/10/13  4:45 AM      Result Value Range   Sodium 137  135 - 145 mEq/L   Potassium 3.5  3.5 - 5.1 mEq/L   Chloride 99  96 - 112 mEq/L   CO2 24  19 - 32 mEq/L   Glucose, Bld 103 (*) 70 - 99 mg/dL   BUN 8  6 - 23 mg/dL   Creatinine, Ser 1.02  0.50 - 1.35 mg/dL   Calcium 9.2  8.4 - 10.5 mg/dL   GFR calc non Af Amer 73 (*) >90 mL/min   GFR calc Af Amer 85 (*) >90 mL/min  TROPONIN I     Status: None   Collection Time    05/10/13  4:45 AM      Result Value Range   Troponin I <0.30  <0.30 ng/mL  GLUCOSE, CAPILLARY     Status: Abnormal   Collection Time    05/10/13  7:45 AM      Result Value Range   Glucose-Capillary 105 (*) 70 - 99 mg/dL  TROPONIN I  Status: None   Collection Time    05/10/13 10:48 AM      Result Value Range   Troponin I <0.30  <0.30 ng/mL  GLUCOSE, CAPILLARY     Status: Abnormal    Collection Time    05/10/13 11:12 AM      Result Value Range   Glucose-Capillary 127 (*) 70 - 99 mg/dL  GLUCOSE, CAPILLARY     Status: Abnormal   Collection Time    05/10/13  4:10 PM      Result Value Range   Glucose-Capillary 174 (*) 70 - 99 mg/dL    Imaging results:  Dg Chest 2 View  05/09/2013   *RADIOLOGY REPORT*  Clinical Data: Altered mental status.  CHEST - 2 VIEW  Comparison: Chest x-ray 08/20/2012.  Findings: Lung volumes are low.  No acute consolidative airspace disease.  No pleural effusions.  Pulmonary vasculature is within normal limits.  Heart size is mildly enlarged. The patient is rotated to the right on today's exam, resulting in distortion of the mediastinal contours and reduced diagnostic sensitivity and specificity for mediastinal pathology.  Atherosclerosis of the thoracic aorta.  Old healed fracture of the posterolateral aspect of the left sixth rib incidentally noted. The lateral view demonstrates a compression fracture of a mid-thoracic vertebral body (likely T8), as well as an upper lumbar vertebral body (likely L2).  IMPRESSION: 1.  No radiographic evidence of acute cardiopulmonary disease. 2.  Mild cardiomegaly. 3.  Atherosclerosis. 4.  Age indeterminate compression fractures in the mid thoracic and upper lumbar spine, as above.   Original Report Authenticated By: Vinnie Langton, M.D.   Ct Head Wo Contrast  05/09/2013   *RADIOLOGY REPORT*  Clinical Data: Altered mental status.  History of fall.  Confusion.  CT HEAD WITHOUT CONTRAST  Technique:  Contiguous axial images were obtained from the base of the skull through the vertex without contrast.  Comparison: Head CT 09/02/2012.  Findings: Moderate cerebral and cerebellar atrophy.  Extensive patchy and confluent areas of decreased attenuation throughout the deep and periventricular white matter of the cerebral hemispheres bilaterally, compatible with chronic microvascular ischemic disease. No acute intracranial  abnormalities.  Specifically, no evidence of acute intracranial hemorrhage, no definite findings of acute/subacute cerebral ischemia, no mass, mass effect, hydrocephalus or abnormal intra or extra-axial fluid collections. Visualized paranasal sinuses are remarkable for mucoperiosteal thickening in the right frontal sinus, frontoethmoidal recess and right maxillary sinus (right maxillary sinus is completely opacified).  There is also a small left mastoid effusion.  IMPRESSION: 1.  No acute intracranial abnormalities. 2.  Moderate cerebral and cerebellar atrophy with extensive chronic microvascular ischemic changes in the cerebral white matter, as above. 3.  Changes of chronic sinusitis in the right frontal, ethmoidal and maxillary sinuses. 4.  Small left mastoid effusion.   Original Report Authenticated By: Vinnie Langton, M.D.   Mr Jeri Cos [id number] Contrast  05/10/2013   *RADIOLOGY REPORT*  Clinical Data: Fall.  Diabetic hypertensive patient with alcohol abuse.  MRI HEAD WITHOUT AND WITH CONTRAST  Technique:  Multiplanar, multiecho pulse sequences of the brain and surrounding structures were obtained according to standard protocol without and with intravenous contrast  Contrast: 45mL MULTIHANCE GADOBENATE DIMEGLUMINE 529 MG/ML IV SOLN  Comparison: 05/09/2013 CT.  05/29/2012 MR.  Findings: No acute infarct.  No intracranial hemorrhage.  Moderate small vessel disease type changes.  Global atrophy without hydrocephalus.  No intracranial mass or abnormal enhancement.  Prominent transverse ligament hypertrophy. Prominent degenerative changes C1-2 articulation.  Spinal stenosis  upper cervical spine without significant cord compression.  Cervical medullary junction, pituitary region, pineal region and orbital structures unremarkable.  Major intracranial vascular structures are patent.  Complete opacification right maxillary sinus.  Opacification right frontal and anterior right ethmoid sinus air cells.  Partial opacification  mastoid air cells greater on the left without obstructing lesion noted in the posterior-superior nasopharynx as a cause of left sided eustachian tube dysfunction.  IMPRESSION: No acute infarct.  Moderate small vessel disease type changes.  Global atrophy without hydrocephalus.  No intracranial mass or abnormal enhancement.  Prominent transverse ligament hypertrophy. Prominent degenerative changes C1-2 articulation.  Spinal stenosis upper cervical spine without significant cord compression.  Complete opacification right maxillary sinus.  Opacification right frontal and anterior right ethmoid sinus air cells.  Partial opacification mastoid air cells greater on the left   Original Report Authenticated By: Genia Del, M.D.    Assessment and Plan: I have seen and evaluated the patient as outlined above. I agree with the formulated Assessment and Plan as detailed in the residents' admission note, with the following changes:  Agree with assessment as described by Dr. Lucila Maine. Patient admitted for syncope/ground level fall likely for alcohol intoxication. He appears back to normal < 24hrs. He appears to be orthostatic per recent repeat vitals, will give IVF to help minimize symptoms.   Carlyle Basques, MD 8/12/20147:05 PM

## 2013-05-10 NOTE — H&P (Signed)
Date: 05/10/2013               Patient Name:  William [name] MRN: [id number]  DOB: [birth year] Age / Sex: 67 y.o., male   PCP: Olga Millers, MD         Medical Service: Internal Medicine Teaching Service         Attending Physician: Dr. Carlyle Basques, MD    First Contact: Dr. Natasha Bence Pager: [phone]  Second Contact: Dr. Ivor Costa Pager: [phone]       After Hours (After 5p/  First Contact Pager: [phone]  weekends / holidays): Second Contact Pager: [phone]   Chief Complaint: Altered mental status  History of Present Illness:  William [name] is a 67yo M PMH PMH of chronic alcohol abuse, DM2, HLD, HTN, baseline cognitive impairment who presents with altered mental status. History was limited by the patient's mental status and hearing deficits.  Earlier today the patient was "drinking coffee and eating cookies" on his front porch when he fell to the ground from a seated position. His neighbors witnessed the fall and helped him back into his house and called 911. Patient denies LOC. He cannot recall if he hit his head. He claims he was never confused. Per EMS sign out, neighbors witnessed no seizure-like activity, no loss of bowel or bladder function, no tongue biting, only that he was crawling on the ground after the fall. The patient denies any chest pain, shortness of breath, weakness, numbness, fever, or abdominal pain.  According to the patient, this same scenario has happened many times before when he was drunk. Today, he admits to drinking [employer] beer. He cannot quantify exactly when or how much, but says he usually drinks 2 quarts of beer 3 days per week. He has been admitted to [facility] in the past for sequelae of alcoholism, including alcoholic ketoacidosis and withdrawal seizures, most recently in 07/2012.   Of note, his granddaughter was living with him and helping him with his activities of daily living until several days ago, when she was kicked out of the  house by their landlord. He has apparently been living alone ever since.  Per clinic notes from earlier this year, patient has baseline cognitive impairment exacerbated by his hearing deficits. He does not answer questions directly, often changes the subject, and is sometimes not oriented to time.  In the ED, personnel felt he was intoxicated. He urinated on the floor during his interview with the ED physician. However his ethyl alcohol level was <11.   Meds: Current Facility-Administered Medications  Medication Dose Route Frequency Provider Last Rate Last Dose  . heparin injection 5,000 Units  5,000 Units Subcutaneous Q8H Otho Bellows, MD   5,000 Units at 05/10/13 0538  . insulin aspart (novoLOG) injection 0-9 Units  0-9 Units Subcutaneous Q4H Otho Bellows, MD      . ondansetron Scnetx) tablet 4 mg  4 mg Oral Q6H PRN Otho Bellows, MD       Or  . ondansetron [facility]) injection 4 mg  4 mg Intravenous Q6H PRN Otho Bellows, MD      . sodium chloride 0.9 % injection 3 mL  3 mL Intravenous Q12H Otho Bellows, MD   3 mL at 05/10/13 0358    Allergies: Allergies as of 05/09/2013  . (No Known Allergies)   Past Medical History  Diagnosis Date  . Diabetes mellitus type II   . Hyperlipemia   .  Anxiety   . Alcohol abuse   . Hearing impairment   . HTN (hypertension)   . Cataract   . Alcohol withdrawal seizure    History reviewed. No pertinent past surgical history. History reviewed. No pertinent family history. History   Social History  . Marital Status: Single    Spouse Name: N/A    Number of Children: N/A  . Years of Education: N/A   Occupational History  . Not on file.   Social History Main Topics  . Smoking status: Current Every Day Smoker    Types: Cigars  . Smokeless tobacco: Current User    Types: Chew  . Alcohol Use: Yes     Comment: vodka--1/2 gal, heavy drinker x8 mos   . Drug Use: No  . Sexually Active: No   Other Topics Concern  . Not on file    Social History Narrative  . No narrative on file    Review of Systems: Pertinent items are noted in HPI.  Physical Exam: Blood pressure 104/71, pulse 77, temperature 98.3 F (36.8 C), temperature source Oral, resp. rate 18, height 5\' 8"  (1.727 m), weight 177 lb 11.2 oz (80.604 kg), SpO2 99.00%. Physical Exam  Constitutional: He is well-developed, well-nourished, and in no distress. No distress.  Curled up in bed under blankets. Appears disheveled. Uncooperative to many of my instructions.  HENT:  Head: Normocephalic and atraumatic.  Right Ear: Decreased hearing is noted.  Left Ear: Decreased hearing is noted.  Eyes: Conjunctivae and EOM are normal. Pupils are equal, round, and reactive to light.  Neck: Normal range of motion. Neck supple.  Cardiovascular: Normal rate, regular rhythm, normal heart sounds and intact distal pulses.  Exam reveals no gallop and no friction rub.   No murmur heard. Pulmonary/Chest: Effort normal and breath sounds normal. He has no wheezes. He has no rales.  Abdominal: Soft. There is no tenderness.  Musculoskeletal: Normal range of motion. He exhibits no edema and no tenderness.  Exam limited by mental status.  Neurological: He has normal sensation, normal strength, normal reflexes and intact cranial nerves. He is not agitated. He displays no weakness and no tremor.  Oriented to self and location [facility] but not time. Cranial nerves, motor exam, sensory exam all grossly intact but evalaution limited by uncooperation.  Skin: Skin is warm and dry. He is not diaphoretic.  Some mild bruising on BL knees.  Psychiatric:  Tangential.    Lab results: Basic Metabolic Panel:  Recent Labs  05/09/13 2151 05/10/13 0445  NA 138 137  K 3.8 3.5  CL 100 99  CO2 24 24  GLUCOSE 115* 103*  BUN 8 8  CREATININE 1.05 1.02  CALCIUM 9.5 9.2  MG 1.5  --    Liver Function Tests:  Recent Labs  05/09/13 2151  AST 32  ALT 19  ALKPHOS 67  BILITOT 0.8   PROT 6.6  ALBUMIN 3.8    Recent Labs  05/10/13 0147  AMMONIA 22   CBC:  Recent Labs  05/09/13 2151 05/10/13 0445  WBC 9.6 5.8  NEUTROABS 8.6*  --   HGB 12.9* 12.8*  HCT 36.4* 36.6*  MCV 95.3 95.6  PLT 133* 135*   Neutrophils Relative %  Date Value Range Status  05/09/2013 89* 43 - 77 % Final    Cardiac Enzymes:  Recent Labs  05/09/13 2317 05/10/13 0445  TROPONINI <0.30 <0.30   POC troponin was slightly elevated at 0.10, but repeat Troponin 1 was normal.  Urine Drug Screen: Drugs of Abuse     Component Value Date/Time   LABOPIA NONE DETECTED 05/09/2013 2337   COCAINSCRNUR NONE DETECTED 05/09/2013 2337   LABBENZ NONE DETECTED 05/09/2013 2337   AMPHETMU NONE DETECTED 05/09/2013 2337   THCU NONE DETECTED 05/09/2013 2337   LABBARB NONE DETECTED 05/09/2013 2337    Alcohol Level:  Recent Labs  05/09/13 2151  ETH <11   Urinalysis:  Recent Labs  05/09/13 2337  COLORURINE YELLOW  LABSPEC 1.013  PHURINE 6.0  GLUCOSEU 500*  HGBUR SMALL*  BILIRUBINUR NEGATIVE  KETONESUR NEGATIVE  PROTEINUR NEGATIVE  UROBILINOGEN 0.2  NITRITE NEGATIVE  LEUKOCYTESUR NEGATIVE   Misc. Labs: Salicylate Lvl  Date Value Range Status  05/10/2013 <2.0* 2.8 - 20.0 mg/dL Final   Acetaminophen (Tylenol), Serum  Date Value Range Status  05/10/2013 <15.0  10 - 30 ug/mL Final                THERAPEUTIC CONCENTRATIONS VARY     SIGNIFICANTLY. A RANGE OF 10-30     ug/mL MAY BE AN EFFECTIVE     CONCENTRATION FOR MANY PATIENTS.     HOWEVER, SOME ARE BEST TREATED     AT CONCENTRATIONS OUTSIDE THIS     RANGE.     ACETAMINOPHEN CONCENTRATIONS     >150 ug/mL AT 4 HOURS AFTER     INGESTION AND >50 ug/mL AT 12     HOURS AFTER INGESTION ARE     OFTEN ASSOCIATED WITH TOXIC     REACTIONS.      Imaging results:  Dg Chest 2 View  05/09/2013   *RADIOLOGY REPORT*  Clinical Data: Altered mental status.  CHEST - 2 VIEW  Comparison: Chest x-ray 08/20/2012.  Findings: Lung volumes are low.   No acute consolidative airspace disease.  No pleural effusions.  Pulmonary vasculature is within normal limits.  Heart size is mildly enlarged. The patient is rotated to the right on today's exam, resulting in distortion of the mediastinal contours and reduced diagnostic sensitivity and specificity for mediastinal pathology.  Atherosclerosis of the thoracic aorta.  Old healed fracture of the posterolateral aspect of the left sixth rib incidentally noted. The lateral view demonstrates a compression fracture of a mid-thoracic vertebral body (likely T8), as well as an upper lumbar vertebral body (likely L2).  IMPRESSION: 1.  No radiographic evidence of acute cardiopulmonary disease. 2.  Mild cardiomegaly. 3.  Atherosclerosis. 4.  Age indeterminate compression fractures in the mid thoracic and upper lumbar spine, as above.   Original Report Authenticated By: Vinnie Langton, M.D.   Ct Head Wo Contrast  05/09/2013   *RADIOLOGY REPORT*  Clinical Data: Altered mental status.  History of fall.  Confusion.  CT HEAD WITHOUT CONTRAST  Technique:  Contiguous axial images were obtained from the base of the skull through the vertex without contrast.  Comparison: Head CT 09/02/2012.  Findings: Moderate cerebral and cerebellar atrophy.  Extensive patchy and confluent areas of decreased attenuation throughout the deep and periventricular white matter of the cerebral hemispheres bilaterally, compatible with chronic microvascular ischemic disease. No acute intracranial abnormalities.  Specifically, no evidence of acute intracranial hemorrhage, no definite findings of acute/subacute cerebral ischemia, no mass, mass effect, hydrocephalus or abnormal intra or extra-axial fluid collections. Visualized paranasal sinuses are remarkable for mucoperiosteal thickening in the right frontal sinus, frontoethmoidal recess and right maxillary sinus (right maxillary sinus is completely opacified).  There is also a small left mastoid effusion.   IMPRESSION: 1.  No acute intracranial  abnormalities. 2.  Moderate cerebral and cerebellar atrophy with extensive chronic microvascular ischemic changes in the cerebral white matter, as above. 3.  Changes of chronic sinusitis in the right frontal, ethmoidal and maxillary sinuses. 4.  Small left mastoid effusion.   Original Report Authenticated By: Vinnie Langton, M.D.    Other results: EKG: unchanged from previous tracings, normal sinus rhythm.  Assessment & Plan by Problem: William [name] is a 67yo M PMH PMH of chronic alcohol abuse, DM2, HLD, HTN, baseline cognitive impairment who presents with altered mental status after ?presyncopal episode.  #Altered mental status - Patient appears intoxicated, but toxicology studies have been negative so far. Cannot exclude other substances such as methanol and ethylene glycol, but no anion gap presently. CMP did not reveal an electrolyte abnormality. CBC shows no leukocytosis, but did show a left shift (89%). LFTs and ammonia level WNL. No sign of UTI on UA. CXR and CT negative for acute process. Acute stroke is a possibility, through no focal neurologic findings noted on exam; will perform work up. Finally, per chart review patient has baseline cognitive impairment that includes disorientation, problems following instructions as well as hearing loss, incontinence. It is possible this is near his baseline mental status. - Admit to IMTS, tele - Checking methanol, ethylene glycol, isopropyl alcohol levels - MRI brain to r/o acute stroke - Echocardiogram, carotid doppler as part of stroke work up - Checking HgA1C, lipid panel as part of stroke work up - PT/OT/SLP - Checking TSH - Repeat BMP, CBC in am - Neuro checks q4h - CIWA checks q6h given alcohol dependence - Banana bag x1L @100cc /hr - In the morning, will call grand-daughter (who lived with him until recently) to assess what his baseline mental status is  #?Presyncopal event - Patient with  witnessed fall on 05/09/13. No LOC, apparently patient was crawling on the ground after the event. He cannot tell us much more than that. He has a history of alcohol withdrawal seizures; however he denies LOC or seizure-like symptoms, endorses drinking alcohol earlier today, and has no stigmata of withdrawal (no tachycardia, agitation, diaphoresis, tremors). Low suspicion for cardiac disease or arrhythmia as patient denies CP or SOB, there were no EKG changes, and his troponin was WNL. - Stroke workup as above - Trending troponins (negative x1) - Checking orthostatics - Holding home terazosin (alpha blocker)   #DM2 - CBGs 106-115 - SSI  #HTN - BP currently 100/81 - Holding home lisinopril  #Incontinence - On oxybutynin 5mg  BID at home. 1 episode in ED. Holding for now since NPO.  #HLD - Holding simvastatin for now since NPO  #DVT PPX - SCDs and subq heparin  Dispo: Disposition is deferred at this time, awaiting improvement of current medical problems. Anticipated discharge in approximately 1-3 day(s).   The patient does have a current PCP Olga Millers, MD) and does need an [facility] follow-up appointment after discharge.  The patient does not have transportation limitations that hinder transportation to clinic appointments.  Signed: Lesly Dukes, MD 05/10/2013, 6:38 AM

## 2013-05-10 NOTE — Progress Notes (Signed)
INITIAL NUTRITION ASSESSMENT  DOCUMENTATION CODES Per approved criteria  -Not Applicable   INTERVENTION: 1. Please monitor magnesium, potassium, and phosphorus daily for at least 3 days, MD to replete as needed, as pt is at risk for refeeding syndrome given weight loss and alcohol abuse. 2. Once diet has been advanced, RD will add appropriate oral nutrition supplements  NUTRITION DIAGNOSIS: Predicted suboptimal nutrient intake related to alcohol withdrawal as evidenced by hx of weight loss.   Goal: Diet advance to meet >/=90% estimated nutrition needs.   Monitor:  PO intake, weight trends, labs, I/O's  Reason for Assessment: Malnutrition Screening Tool  67 y.o. male  Admitting Dx:  AMS   ASSESSMENT: Pt was admitted after falling while at home. Pt with AMS and some agitation per notes.  Pt sleeping soundly at time of RD visit. Given recent agitation RD did not complete nutrition focused physical exam at this time.  Weight hx does show some variable weights, with weight loss of 26 lbs in the past 3 months. Usual weight is about 190 lbs, expect that true weight loss is closer to 10-15 lbs recently.  Pt with hx of excessive alcohol intake, recently with 2 quarts of beer 3 days per week. Also could be contributing to weight loss.   Given weight loss and alcohol abuse, pt is at increased risk for refeeding syndrome. Recommend monitor Mag, Phos and Potassium for at [place] 3 days and replete as needed.  Pt likely has some level of malnutrition.   Height: Ht Readings from Last 1 Encounters:  05/10/13 5\' 8"  (1.727 m)    Weight: Wt Readings from Last 1 Encounters:  05/10/13 177 lb 11.2 oz (80.604 kg)    Ideal Body Weight: 170 lbs   % Ideal Body Weight: 104%  Wt Readings from Last 10 Encounters:  05/10/13 177 lb 11.2 oz (80.604 kg)  02/10/13 203 lb 14.4 oz (92.488 kg)  01/31/13 198 lb 11.2 oz (90.13 kg)  08/21/12 179 lb 3.7 oz (81.3 kg)  06/04/12 186 lb 14.4 oz (84.777 kg)   05/30/12 185 lb 3 oz (84 kg)  02/19/12 194 lb 1.6 oz (88.043 kg)  11/11/11 189 lb 11.2 oz (86.047 kg)  03/05/10 215 lb 8 oz (97.75 kg)  12/03/09 214 lb 6.4 oz (97.251 kg)    Usual Body Weight: 185-190 lbs  % Usual Body Weight: 93%  BMI:  Body mass index is 27.03 kg/(m^2). Overweight  Estimated Nutritional Needs: Kcal: 1800-2000 Protein: 80-90 gm  Fluid: 1.8-2 L   Skin: intact   Diet Order: NPO  EDUCATION NEEDS: -No education needs identified at this time   Intake/Output Summary (Last 24 hours) at 05/10/13 1230 Last data filed at 05/10/13 1100  Gross per 24 hour  Intake      3 ml  Output    951 ml  Net   -948 ml    Last BM: PTA    Labs:   Recent Labs Lab 05/09/13 2151 05/10/13 0445  NA 138 137  K 3.8 3.5  CL 100 99  CO2 24 24  BUN 8 8  CREATININE 1.05 1.02  CALCIUM 9.5 9.2  MG 1.5  --   GLUCOSE 115* 103*    CBG (last 3)   Recent Labs  05/10/13 0318 05/10/13 0745 05/10/13 1112  GLUCAP 94 105* 127*    Scheduled Meds: . heparin  5,000 Units Subcutaneous Q8H  . insulin aspart  0-9 Units Subcutaneous Q4H  . sodium chloride  3 mL Intravenous  Q12H    Continuous Infusions:   Past Medical History  Diagnosis Date  . Diabetes mellitus type II   . Hyperlipemia   . Anxiety   . Alcohol abuse   . Hearing impairment   . HTN (hypertension)   . Cataract   . Alcohol withdrawal seizure     History reviewed. No pertinent past surgical history.  [name] RD, LDN Pager [phone] After Hours pager [phone]

## 2013-05-10 NOTE — Clinical Documentation Improvement (Signed)
THIS DOCUMENT IS NOT A PERMANENT PART OF THE MEDICAL RECORD  Please update your documentation with the medical record to reflect your response to this query. If you need help knowing how to do this please call [phone].  05/10/13   Dear Dr. Lucila Maine,  In a better effort to capture your patient's severity of illness, reflect appropriate length of stay and utilization of resources, a review of the patient medical record has revealed the following indicators.    Per H&P patient is admitted with "Altered Mental Status". This term is nonspecific and low weighted with low length of stay. Please clarify term "AMS" to more clearly illustrate this patient's severity of illness and risk of mortality. Thank you.  Possible Clinical Conditions? - Acute encephalopathy         Toxic          Metabolic         Hypoxic - Acute CVA  - Acute confusion - Acute delirium - Acute exacerbation of dementia - Other Condition (please specify)  Supporting Information: - Risk Factors: chronic alcohol dependence, cognitive impairment - Treatment: CVA workup ongoing, CIWA protocol  You may use possible, probable, or suspect with inpatient documentation. possible, probable, suspected diagnoses MUST be documented at the time of discharge  Reviewed:  no additional documentation provided. On admission we were unsure why he was having altered mental status. Further work up was warranted, and I explained the broad differential in my assessment and plan including acute CVA, encephalopathy, etc. Please refer to daily progress notes.  Thank You,  Ezekiel Ina RN BSN Clinical Documentation Specialist: [place]

## 2013-05-10 NOTE — Progress Notes (Signed)
UR Completed Kaylana Fenstermacher Graves-Bigelow, RN,BSN [phone]  

## 2013-05-10 NOTE — Progress Notes (Signed)
Patient admitted to floor from [facility] ED for further management of AMS.  Arrived to unit via stretcher, in no apparent distress.  Capable of transferring from stretcher to bed without complication.  He is A&O x2, he is disoriented to time and situation, as well as hard of hearing.  Placed on telemetry and bed alarm activated.  Will continue to monitor patient condition.  Olivia Canter RN
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
## 2013-05-11 DIAGNOSIS — I6789 Other cerebrovascular disease: Secondary | ICD-10-CM | POA: Diagnosis not present

## 2013-05-11 DIAGNOSIS — G459 Transient cerebral ischemic attack, unspecified: Secondary | ICD-10-CM | POA: Diagnosis not present

## 2013-05-11 LAB — URINE CULTURE
Colony Count: NO GROWTH
Culture: NO GROWTH

## 2013-05-11 LAB — BASIC METABOLIC PANEL
BUN: 7 mg/dL (ref 6–23)
Creatinine, Ser: 1.01 mg/dL (ref 0.50–1.35)
GFR calc non Af Amer: 74 mL/min — ABNORMAL LOW (ref >90)
Glucose, Bld: 106 mg/dL — ABNORMAL HIGH (ref 70–99)
Potassium: 3.4 mEq/L — ABNORMAL LOW (ref 3.5–5.1)

## 2013-05-11 LAB — GLUCOSE, CAPILLARY
Glucose-Capillary: 138 mg/dL — ABNORMAL HIGH (ref 70–99)
Glucose-Capillary: 135 mg/dL — ABNORMAL HIGH (ref 70–99)
Glucose-Capillary: 94 mg/dL (ref 70–99)
Glucose-Capillary: 124 mg/dL — ABNORMAL HIGH (ref 70–99)

## 2013-05-11 MED ORDER — POTASSIUM CHLORIDE CRYS ER 20 MEQ PO TBCR
40.0000 meq | EXTENDED_RELEASE_TABLET | Freq: Once | ORAL | Status: AC
  Administered 2013-05-11: 40 meq via ORAL
  Filled 2013-05-11: qty 2

## 2013-05-11 NOTE — Progress Notes (Signed)
CSW informed by pt nurse that pt no longer has reliable transport home. CSW called for ambulance transport. CSW signing off.   [place], [place]

## 2013-05-11 NOTE — Evaluation (Signed)
Physical Therapy Evaluation Patient Details Name: William [name] MRN: [id number] DOB: 1946/04/14 Today's Date: 05/11/2013 Time: [id number] PT Time Calculation (min): 23 min  PT Assessment / Plan / Recommendation History of Present Illness    67yo M with alcohol dependence presents with ground level fall with abrasian to knees and possible loss of consciousness who admits to drinking excessively after family argument. Interestingly no detectable BAL.   Clinical Impression  Pt presents with moderate to severe higher level balance deficits compensated for with rolling walker for ambulation, but with history of ETOH and previous fall along with complicated psychosocial situation, pt is at high risk for fall at home.  Will benefit from physical therapy for balance retraining and fall prevention education.    Likely the BEST option is to seek placement in ALF however I doubt pt would agree to this plan; he likes where he lives and does admit he may need to move now that his surrogate daughter has been evicted by landlord for drug use and he is alone now.    Recommend CSW to assist with social issues and options; and RNCM to assist with post acute therapy plan to include HHPT.    PT Assessment  Patient needs continued PT services    Follow Up Recommendations  Home health PT;Supervision/Assistance - 24 hour    Does the patient have the potential to tolerate intense rehabilitation      Barriers to Discharge Decreased caregiver support 'daughter' kicked out of house, pt prefers to live there but not alone "I may have to move!" and has just paid rent for the month    Equipment Recommendations  None recommended by PT    Recommendations for Other Services     Frequency Min 3X/week    Precautions / Restrictions Precautions Precautions: Fall Precaution Comments: last fall before this admission's was 1 year ago per pt, higher level balance and moderately impulsive   Pertinent Vitals/Pain  na      Mobility  Bed Mobility Bed Mobility: Supine to Sit;Sit to Supine Supine to Sit: 7: Independent Sit to Supine: 7: Independent Transfers Transfers: Sit to Stand;Stand to Sit Sit to Stand: 6: Modified independent (Device/Increase time) Stand to Sit: 6: Modified independent (Device/Increase time) Details for Transfer Assistance: pt stands/sits without obvious difficulty or instability, does use RW and is able to maintain stabilty with controlled environnment without immediate need for external assist Ambulation/Gait Ambulation/Gait Assistance: 6: Modified independent (Device/Increase time) Ambulation Distance (Feet): 200 Feet Assistive device: Rolling walker Ambulation/Gait Assistance Details: pt maintains consistent speed with occasional gait modifications when looking around, with head turns or if distracted by environment howvever maintains stability without loss of balance Gait Pattern: Step-through pattern;Wide base of support Gait velocity: unmeasured but estimated to be at/near 3.3 ft/sec 'normal'    Exercises     PT Diagnosis: Difficulty walking  PT Problem List: Decreased safety awareness;Decreased balance PT Treatment Interventions: Patient/family education;Balance training;Therapeutic activities;Functional mobility training;Gait training;DME instruction     PT Goals(Current goals can be found in the care plan section) Acute Rehab PT Goals Patient Stated Goal: agrees to goal of no falls at home PT Goal Formulation: With patient Potential to Achieve Goals: Good  Visit Information  Last PT Received On: 05/11/13 Assistance Needed: +1       Prior [place] expects to be discharged to:: Private residence Living Arrangements: Alone Available Help at Discharge: Family;Friend(s);Available PRN/intermittently Type of Home: House Home Access: Level entry Home Layout: One level  Home Equipment: [place] - 4 wheels (pt added back wheels to  standard rolling walker) Prior Function Level of Independence: Independent with assistive device(s) Comments: uses walker consistently per pt Communication Communication: HOH    Cognition  Cognition Arousal/Alertness: Awake/alert Behavior During Therapy: WFL for tasks assessed/performed Overall Cognitive Status: History of cognitive impairments - at baseline    Extremity/Trunk Assessment Upper Extremity Assessment Upper Extremity Assessment: Overall WFL for tasks assessed Lower Extremity Assessment Lower Extremity Assessment: Overall WFL for tasks assessed Cervical / Trunk Assessment Cervical / Trunk Assessment: Kyphotic (mild forward trunk)   Balance Balance Balance Assessed: Yes Static Standing Balance Tandem Stance - Right Leg: 0 (cannot stand in full tandem or let go of walker; steps forwa) Tandem Stance - Left Leg: 0 (sames as right) [place] - Eyes Opened: 0 (can't keep feet together and let go of walker) [place] - Eyes Closed: 0 (cannot tolerate eyes closed) High Level Balance High Level Balance Activites: Head turns;Turns High Level Balance Comments: mild changes in gait trajectory with head turns more to right vs. left, unable to demonstrate vertical head turns with gait due to distractiblity  End of Session PT - End of Session Activity Tolerance: Patient tolerated treatment well Patient left: in bed;with nursing/sitter in room Nurse Communication: Mobility status  GP     [name] 05/11/2013, 10:08 AM

## 2013-05-11 NOTE — Progress Notes (Signed)
PTAR came to transport pt home.

## 2013-05-11 NOTE — Progress Notes (Signed)
CSW informed that pt may need SNF placement and is medically ready for dc today. CSW spoke with pt who confirmed that he has supportive neighbors and a significant other who check up on him regularly and provide good support. Pt also told CSW that he would feel safe returning home with home health. Pt regularly uses the bus for transportation with assistance of a walker. CSW called pt friend Delphina Cahill to ask for assistance with confirming assistance at home as well as to inquire about possible transportation at dc. Pt friend unable to provide CSW with any more information. CSW also tried contacting pt significant other and left voicemail. RN CM informed CSW that pt will be picked up by neighbor today for dc. Pt mentioned substance abuse to CSW, but CSW unable to complete assessment due to pt declining interest. CSW signing off.  [place], [place]

## 2013-05-11 NOTE — Progress Notes (Signed)
Subjective: William [name] is a 67yo M PMH PMH of chronic alcohol abuse, DM2, HLD, HTN, baseline cognitive impairment who presents with altered mental status s/p fall in his yard on 05/09/13.   Patient seen at bedside this AM. Says he is feeling much better. Appetite is good, no complaints.   Yesterday, patient was still unsteady on his feet w/ orthostatic hypotension still present. For PT eval this AM to clear for discharge.  Received some fluids overnight.  Objective: Vital signs in last 24 hours: Filed Vitals:   05/10/13 1444 05/10/13 1954 05/11/13 0200 05/11/13 0800  BP: 137/101 150/98 155/104 143/91  Pulse: 108 87 84 108  Temp:  98.3 F (36.8 C) 98.2 F (36.8 C) 98.8 F (37.1 C)  TempSrc:  Oral Oral Oral  Resp: 22 20 20 18   Height:      Weight:      SpO2: 98% 98% 100% 100%   Weight change:   Intake/Output Summary (Last 24 hours) at 05/11/13 0856 Last data filed at 05/11/13 0844  Gross per 24 hour  Intake    480 ml  Output   1201 ml  Net   -721 ml   Physical Exam: General: Alert, cooperative, and in no apparent distress HEENT: Vision grossly intact, oropharynx clear and non-erythematous. Hard of hearing. Neck: Full range of motion without pain, supple, no lymphadenopathy or carotid bruits Lungs: Clear to ascultation bilaterally, normal work of respiration, no wheezes, rales, ronchi Heart: Regular rate and rhythm, no murmurs, gallops, or rubs Abdomen: Soft, non-tender, non-distended, normal bowel sounds Extremities: No cyanosis, clubbing, or edema Neurologic: Alert & oriented X3, cranial nerves II-XII intact, strength grossly intact, sensation intact to light touch  Lab Results: Basic Metabolic Panel:  Recent Labs Lab 05/09/13 2151 05/10/13 0445 05/11/13 0501  NA 138 137 133*  K 3.8 3.5 3.4*  CL 100 99 98  CO2 24 24 26   GLUCOSE 115* 103* 106*  BUN 8 8 7   CREATININE 1.05 1.02 1.01  CALCIUM 9.5 9.2 8.5  MG 1.5  --   --    Liver Function  Tests:  Recent Labs Lab 05/09/13 2151  AST 32  ALT 19  ALKPHOS 67  BILITOT 0.8  PROT 6.6  ALBUMIN 3.8   No results found for this basename: LIPASE, AMYLASE,  in the last 168 hours  Recent Labs Lab 05/10/13 0147  AMMONIA 22   CBC:  Recent Labs Lab 05/09/13 2151 05/10/13 0445  WBC 9.6 5.8  NEUTROABS 8.6*  --   HGB 12.9* 12.8*  HCT 36.4* 36.6*  MCV 95.3 95.6  PLT 133* 135*   Cardiac Enzymes:  Recent Labs Lab 05/09/13 2317 05/10/13 0445 05/10/13 1048  TROPONINI <0.30 <0.30 <0.30   BNP: No results found for this basename: PROBNP,  in the last 168 hours D-Dimer: No results found for this basename: DDIMER,  in the last 168 hours CBG:  Recent Labs Lab 05/10/13 1112 05/10/13 1610 05/10/13 1949 05/11/13 0026 05/11/13 0359 05/11/13 0734  GLUCAP 127* 174* 122* 138* 113* 135*   Hemoglobin A1C:  Recent Labs Lab 05/10/13 0445  HGBA1C 5.8*   Fasting Lipid Panel: No results found for this basename: CHOL, HDL, LDLCALC, TRIG, CHOLHDL, LDLDIRECT,  in the last 168 hours Thyroid Function Tests:  Recent Labs Lab 05/10/13 0445  TSH 3.661   Coagulation: No results found for this basename: LABPROT, INR,  in the last 168 hours Anemia Panel: No results found for this basename: VITAMINB12, FOLATE, FERRITIN,  TIBC, IRON, RETICCTPCT,  in the last 168 hours Urine Drug Screen: Drugs of Abuse     Component Value Date/Time   LABOPIA NONE DETECTED 05/09/2013 2337   [place] 05/09/2013 2337   LABBENZ NONE DETECTED 05/09/2013 2337   AMPHETMU NONE DETECTED 05/09/2013 2337   THCU NONE DETECTED 05/09/2013 2337   LABBARB NONE DETECTED 05/09/2013 2337    Alcohol Level:  Recent Labs Lab 05/09/13 2151  ETH <11   Urinalysis:  Recent Labs Lab 05/09/13 2337  COLORURINE YELLOW  LABSPEC 1.013  PHURINE 6.0  GLUCOSEU 500*  HGBUR SMALL*  BILIRUBINUR NEGATIVE  KETONESUR NEGATIVE  PROTEINUR NEGATIVE  UROBILINOGEN 0.2  NITRITE NEGATIVE  LEUKOCYTESUR  NEGATIVE   Micro Results: Recent Results (from the past 240 hour(s))  URINE CULTURE     Status: None   Collection Time    05/09/13 11:37 PM      Result Value Range Status   Specimen Description URINE, CATHETERIZED   Final   Special Requests NONE   Final   Culture  Setup Time     Final   Value: 05/10/2013 00:50     Performed at [facility]     Final   Value: NO GROWTH     Performed at [facility]   Culture     Final   Value: NO GROWTH     Performed at [facility]   Report Status 05/11/2013 FINAL   Final   Studies/Results: Dg Chest 2 View  05/09/2013   *RADIOLOGY REPORT*  Clinical Data: Altered mental status.  CHEST - 2 VIEW  Comparison: Chest x-ray 08/20/2012.  Findings: Lung volumes are low.  No acute consolidative airspace disease.  No pleural effusions.  Pulmonary vasculature is within normal limits.  Heart size is mildly enlarged. The patient is rotated to the right on today's exam, resulting in distortion of the mediastinal contours and reduced diagnostic sensitivity and specificity for mediastinal pathology.  Atherosclerosis of the thoracic aorta.  Old healed fracture of the posterolateral aspect of the left sixth rib incidentally noted. The lateral view demonstrates a compression fracture of a mid-thoracic vertebral body (likely T8), as well as an upper lumbar vertebral body (likely L2).  IMPRESSION: 1.  No radiographic evidence of acute cardiopulmonary disease. 2.  Mild cardiomegaly. 3.  Atherosclerosis. 4.  Age indeterminate compression fractures in the mid thoracic and upper lumbar spine, as above.   Original Report Authenticated By: Vinnie Langton, M.D.   Ct Head Wo Contrast  05/09/2013   *RADIOLOGY REPORT*  Clinical Data: Altered mental status.  History of fall.  Confusion.  CT HEAD WITHOUT CONTRAST  Technique:  Contiguous axial images were obtained from the base of the skull through the vertex without contrast.  Comparison: Head CT  09/02/2012.  Findings: Moderate cerebral and cerebellar atrophy.  Extensive patchy and confluent areas of decreased attenuation throughout the deep and periventricular white matter of the cerebral hemispheres bilaterally, compatible with chronic microvascular ischemic disease. No acute intracranial abnormalities.  Specifically, no evidence of acute intracranial hemorrhage, no definite findings of acute/subacute cerebral ischemia, no mass, mass effect, hydrocephalus or abnormal intra or extra-axial fluid collections. Visualized paranasal sinuses are remarkable for mucoperiosteal thickening in the right frontal sinus, frontoethmoidal recess and right maxillary sinus (right maxillary sinus is completely opacified).  There is also a small left mastoid effusion.  IMPRESSION: 1.  No acute intracranial abnormalities. 2.  Moderate cerebral and cerebellar atrophy with extensive chronic microvascular  ischemic changes in the cerebral white matter, as above. 3.  Changes of chronic sinusitis in the right frontal, ethmoidal and maxillary sinuses. 4.  Small left mastoid effusion.   Original Report Authenticated By: Vinnie Langton, M.D.   Mr Jeri Cos [id number] Contrast  05/10/2013   *RADIOLOGY REPORT*  Clinical Data: Fall.  Diabetic hypertensive patient with alcohol abuse.  MRI HEAD WITHOUT AND WITH CONTRAST  Technique:  Multiplanar, multiecho pulse sequences of the brain and surrounding structures were obtained according to standard protocol without and with intravenous contrast  Contrast: 66mL MULTIHANCE GADOBENATE DIMEGLUMINE 529 MG/ML IV SOLN  Comparison: 05/09/2013 CT.  05/29/2012 MR.  Findings: No acute infarct.  No intracranial hemorrhage.  Moderate small vessel disease type changes.  Global atrophy without hydrocephalus.  No intracranial mass or abnormal enhancement.  Prominent transverse ligament hypertrophy. Prominent degenerative changes C1-2 articulation.  Spinal stenosis upper cervical spine without significant cord  compression.  Cervical medullary junction, pituitary region, pineal region and orbital structures unremarkable.  Major intracranial vascular structures are patent.  Complete opacification right maxillary sinus.  Opacification right frontal and anterior right ethmoid sinus air cells.  Partial opacification mastoid air cells greater on the left without obstructing lesion noted in the posterior-superior nasopharynx as a cause of left sided eustachian tube dysfunction.  IMPRESSION: No acute infarct.  Moderate small vessel disease type changes.  Global atrophy without hydrocephalus.  No intracranial mass or abnormal enhancement.  Prominent transverse ligament hypertrophy. Prominent degenerative changes C1-2 articulation.  Spinal stenosis upper cervical spine without significant cord compression.  Complete opacification right maxillary sinus.  Opacification right frontal and anterior right ethmoid sinus air cells.  Partial opacification mastoid air cells greater on the left   Original Report Authenticated By: Genia Del, M.D.   Medications: I have reviewed the patient's current medications. Scheduled Meds: . heparin  5,000 Units Subcutaneous Q8H  . insulin aspart  0-9 Units Subcutaneous Q4H  . potassium chloride  40 mEq Oral Once  . sodium chloride  3 mL Intravenous Q12H   Continuous Infusions: . sodium chloride 100 mL/hr at 05/10/13 1758   PRN Meds:.LORazepam, ondansetron (ZOFRAN) IV, ondansetron, traMADol Assessment/Plan: Mr. BURCH BREITKREUTZ is a 67yo M PMH PMH of chronic alcohol abuse, DM2, HLD, HTN, baseline cognitive impairment who presents with altered mental status after ?presyncopal episode.   #Altered mental status - Patient appeared intoxicated in the ED, but toxicology studies were all negaitve. Cannot exclude other substances such as methanol and ethylene glycol, but no anion gap. CMP did not reveal an electrolyte abnormality. CBC shows no leukocytosis, but did show a left shift (89%).  LFTs and ammonia level WNL. No sign of UTI on UA. CXR and CT negative for acute process. Acute stroke is a possibility, through no focal neurologic findings noted on exam. Finally, per chart review patient has baseline cognitive impairment that includes disorientation, problems following instructions as well as hearing loss, incontinence. It is possible this is near his baseline mental status. After spending mre time with Mr. Kooiker, it os more likely that this is in fat his baseline. - Patient on tele - MRI brain to r/o acute stroke shows no acute infarct. Moderate small vessel disease type changes. Global atrophy without hydrocephalus. No intracranial mass or abnormal enhancement. -  HgA1C 5.8. - PT recommends HHPT as he is still very uneasy on his feet with poor balance. - TSH 3.66, wnl - Repeat BMP, CBC - Neuro checks q4h  - CIWA checks q6h given alcohol  dependence  - Banana bag x1L @100cc /hr  - In the morning, will call grand-daughter (who lived with him until recently) to assess what his baseline mental status is   #?Presyncopal event - Patient with witnessed fall on 05/09/13. No LOC, apparently patient was crawling on the ground after the event. He cannot tell us much more than that. He has a history of alcohol withdrawal seizures; however he denies LOC or seizure-like symptoms, endorses drinking alcohol earlier today, and has no stigmata of withdrawal (no tachycardia, agitation, diaphoresis, tremors). Low suspicion for cardiac disease or arrhythmia as patient denies CP or SOB, there were no EKG changes, and his troponin was WNL.  - Stroke workup as above  - Trending troponins (negative x1)  - Checking orthostatics  - Holding home terazosin (alpha blocker)  #DM2 - CBGs 106-115  - SSI  #HTN - BP currently 100/81  - Holding home lisinopril  #Incontinence - On oxybutynin 5mg  BID at home. 1 episode in ED. Holding for now since NPO.  #HLD - Holding simvastatin for now since NPO  #DVT PPX -  SCDs and subq heparin  Dispo: Disposition is deferred at this time, awaiting improvement of current medical problems.  Anticipated discharge today.   The patient does have a current PCP Olga Millers, MD) and does need an [facility] hospital follow-up appointment after discharge.  The patient does have transportation limitations that hinder transportation to clinic appointments.  .Services Needed at time of discharge: Y = Yes, Blank = No PT: Y  OT: N  RN: N  Equipment: Y  Other: N    LOS: 2 days   Luanne Bras, MD 05/11/2013, 8:56 AM

## 2013-05-11 NOTE — Progress Notes (Signed)
Pt has no ride home and he is unable to walk well enough to use the bus or cab. PTAR was called by social work earlier and I also called PTAR at [phone]. Still waiting for transportation. Pt stable and impatient to go home.

## 2013-05-11 NOTE — Care Management (Signed)
Case Manager received notice from [place] that the neighbor is not going to pick the pt up. He does not have transportation home at this time and CSW [place] is aware.  CSW may be working on ambulance transportation home if no other alternative.  CM did try to place calls to [facility] and she is not able to pick pt up from hospital. CM will continue to monitor. Bethena Roys, RN,BSN [phone]

## 2013-05-12 LAB — GLUCOSE, CAPILLARY: Glucose-Capillary: 166 mg/dL — ABNORMAL HIGH (ref 70–99)

## 2013-05-14 NOTE — Discharge Summary (Signed)
Name: William [name] MRN: [id number] DOB: [birth year] 67 y.o. PCP: Olga Millers, MD  Date of Admission: 05/09/2013  7:43 PM Date of Discharge: 05/11/13 Attending Physician: Dr. Carlyle Basques Discharge Diagnosis: 1. Altered Mental Status- Patient had a witnessed fall and was admitted for altered mental status, however, it is possible that d/t his cognitive impairment, this is his baseline. His BAL on admission was undetectable despite his admission to drinking [employer] beer the day of his admission. 2. Presyncopal event- Patient had a fall at home, witnessed by his neighbors, according to ED notes. The patient is very uneasy on his feet and uses a walker to move around. The patient denies any LOC during the event. 3. DM 4. HTN 5. Hearing loss  Discharge Medications:   Medication List         FLUoxetine 40 MG capsule  Commonly known as:  PROZAC  Take 1 capsule (40 mg total) by mouth daily.     hydrochlorothiazide 25 MG tablet  Commonly known as:  HYDRODIURIL  Take 25 mg by mouth daily.     OVER THE COUNTER MEDICATION  Apply 1 application topically daily as needed (for eczema/dry skin).     OVER THE COUNTER MEDICATION  Take 1 tablet by mouth daily as needed (pain). Arthritis pills     oxybutynin 5 MG tablet  Commonly known as:  DITROPAN  Take 5 mg by mouth daily.     simvastatin 20 MG tablet  Commonly known as:  ZOCOR  Take 1 tablet (20 mg total) by mouth at bedtime.        Disposition and follow-up:   [name] was discharged from [facility] in Good condition.  At the hospital follow up visit please address:  1.  Discuss HHPT, steadiness on his feet, living situation, alcohol intake.  2.  Labs / imaging needed at time of follow-up: none  3.  Pending labs/ test needing follow-up: none  Follow-up Appointments:     Follow-up Information   Follow up with Blain Pais, MD On 05/23/2013. (10:30 AM)    Specialty:   Internal Medicine   Contact information:   [address] [place] [address] [phone]       Discharge Instructions: Discharge Orders   Future Appointments Provider Department Dept Phone   05/23/2013 10:30 AM Blain Pais, MD [place] [phone]   Future Orders Complete By Expires   Call MD for:  difficulty breathing, headache or visual disturbances  As directed    Call MD for:  persistant dizziness or light-headedness  As directed    Diet - low sodium heart healthy  As directed    Increase activity slowly  As directed       Consultations:  PT  Procedures Performed:  Dg Chest 2 View  05/09/2013   *RADIOLOGY REPORT*  Clinical Data: Altered mental status.  CHEST - 2 VIEW  Comparison: Chest x-ray 08/20/2012.  Findings: Lung volumes are low.  No acute consolidative airspace disease.  No pleural effusions.  Pulmonary vasculature is within normal limits.  Heart size is mildly enlarged. The patient is rotated to the right on today's exam, resulting in distortion of the mediastinal contours and reduced diagnostic sensitivity and specificity for mediastinal pathology.  Atherosclerosis of the thoracic aorta.  Old healed fracture of the posterolateral aspect of the left sixth rib incidentally noted. The lateral view demonstrates a compression fracture of a mid-thoracic vertebral body (  likely T8), as well as an upper lumbar vertebral body (likely L2).  IMPRESSION: 1.  No radiographic evidence of acute cardiopulmonary disease. 2.  Mild cardiomegaly. 3.  Atherosclerosis. 4.  Age indeterminate compression fractures in the mid thoracic and upper lumbar spine, as above.   Original Report Authenticated By: Vinnie Langton, M.D.   Ct Head Wo Contrast  05/09/2013   *RADIOLOGY REPORT*  Clinical Data: Altered mental status.  History of fall.  Confusion.  CT HEAD WITHOUT CONTRAST  Technique:  Contiguous axial images were obtained from the base of the skull through the  vertex without contrast.  Comparison: Head CT 09/02/2012.  Findings: Moderate cerebral and cerebellar atrophy.  Extensive patchy and confluent areas of decreased attenuation throughout the deep and periventricular white matter of the cerebral hemispheres bilaterally, compatible with chronic microvascular ischemic disease. No acute intracranial abnormalities.  Specifically, no evidence of acute intracranial hemorrhage, no definite findings of acute/subacute cerebral ischemia, no mass, mass effect, hydrocephalus or abnormal intra or extra-axial fluid collections. Visualized paranasal sinuses are remarkable for mucoperiosteal thickening in the right frontal sinus, frontoethmoidal recess and right maxillary sinus (right maxillary sinus is completely opacified).  There is also a small left mastoid effusion.  IMPRESSION: 1.  No acute intracranial abnormalities. 2.  Moderate cerebral and cerebellar atrophy with extensive chronic microvascular ischemic changes in the cerebral white matter, as above. 3.  Changes of chronic sinusitis in the right frontal, ethmoidal and maxillary sinuses. 4.  Small left mastoid effusion.   Original Report Authenticated By: Vinnie Langton, M.D.   Mr Jeri Cos [id number] Contrast  05/10/2013   *RADIOLOGY REPORT*  Clinical Data: Fall.  Diabetic hypertensive patient with alcohol abuse.  MRI HEAD WITHOUT AND WITH CONTRAST  Technique:  Multiplanar, multiecho pulse sequences of the brain and surrounding structures were obtained according to standard protocol without and with intravenous contrast  Contrast: 42mL MULTIHANCE GADOBENATE DIMEGLUMINE 529 MG/ML IV SOLN  Comparison: 05/09/2013 CT.  05/29/2012 MR.  Findings: No acute infarct.  No intracranial hemorrhage.  Moderate small vessel disease type changes.  Global atrophy without hydrocephalus.  No intracranial mass or abnormal enhancement.  Prominent transverse ligament hypertrophy. Prominent degenerative changes C1-2 articulation.  Spinal stenosis upper  cervical spine without significant cord compression.  Cervical medullary junction, pituitary region, pineal region and orbital structures unremarkable.  Major intracranial vascular structures are patent.  Complete opacification right maxillary sinus.  Opacification right frontal and anterior right ethmoid sinus air cells.  Partial opacification mastoid air cells greater on the left without obstructing lesion noted in the posterior-superior nasopharynx as a cause of left sided eustachian tube dysfunction.  IMPRESSION: No acute infarct.  Moderate small vessel disease type changes.  Global atrophy without hydrocephalus.  No intracranial mass or abnormal enhancement.  Prominent transverse ligament hypertrophy. Prominent degenerative changes C1-2 articulation.  Spinal stenosis upper cervical spine without significant cord compression.  Complete opacification right maxillary sinus.  Opacification right frontal and anterior right ethmoid sinus air cells.  Partial opacification mastoid air cells greater on the left   Original Report Authenticated By: Genia Del, M.D.   Admission HPI:  Mr. TAVONTE CRUDELE is a 67yo M PMH PMH of chronic alcohol abuse, DM2, HLD, HTN, baseline cognitive impairment who presents with altered mental status. History was limited by the patient's mental status and hearing deficits.  Earlier today the patient was "drinking coffee and eating cookies" on his front porch when he fell to the ground from a seated position. His neighbors witnessed the  fall and helped him back into his house and called 911. Patient denies LOC. He cannot recall if he hit his head. He claims he was never confused. Per EMS sign out, neighbors witnessed no seizure-like activity, no loss of bowel or bladder function, no tongue biting, only that he was crawling on the ground after the fall. The patient denies any chest pain, shortness of breath, weakness, numbness, fever, or abdominal pain.  According to the patient,  this same scenario has happened many times before when he was drunk. Today, he admits to drinking [employer] beer. He cannot quantify exactly when or how much, but says he usually drinks 2 quarts of beer 3 days per week. He has been admitted to [facility] in the past for sequelae of alcoholism, including alcoholic ketoacidosis and withdrawal seizures, most recently in 07/2012.  Of note, his granddaughter was living with him and helping him with his activities of daily living until several days ago, when she was kicked out of the house by their landlord. He has apparently been living alone ever since.  Per clinic notes from earlier this year, patient has baseline cognitive impairment exacerbated by his hearing deficits. He does not answer questions directly, often changes the subject, and is sometimes not oriented to time.  In the ED, personnel felt he was intoxicated. He urinated on the floor during his interview with the ED physician. However his ethyl alcohol level was <11.  Hospital Course by problem list:   1. Altered Mental Status- 67yo M PMH PMH of chronic alcohol abuse, DM2, HLD, HTN, baseline cognitive impairment who presents with altered mental status. History was limited by the patient's mental status and hearing deficits. The patient says he fell out of his chair and was helped back to his feet by his neighbors who proceeded to call 911. On arrival to the ED, it was thought that the patient was intoxicated as he was acting altered and urinated on the floor. UDS was negative and BAL was undetectable.The patient has been seen many times at [facility] for alcohol abuse associated issues and was placed on CIWA protocol for this admission. The patient was admitted to telemetry and an MRI was performed showing no acute infarct, moderate small vessel disease type changes, and global atrophy without hydrocephalus. No intracranial mass or abnormal enhancement. ECHO and Carotid dopplers were scheduled, but it was thought  that this issue was not stroke related after some resolution of mental status. Methanol, ethylene glycol, and isopropyl alcohol levels were all negative, and TSH was found to be normal as well. HbA1c was found to be 5.8. He was also given a banana bag @ 100 ml/hr. The next day, his mentation seemed somewhat improved. It was thought that d/t his cognitive impairment and significant hearing loss, that this was more or less his baseline mental status.   2. Presyncopal event- The patient's witnessed fall was seen by his neighbors who called 911. N arrival to the ED, he was worked up for stroke and seizure (both of which were negative). No toxins identified. On examination of orthostatic vital signs, teh patient did show orthostatic changes, possibly being an explanation for his near syncope. He also takes terazosin for prostate issues as well as moderate HTN. The patient was given a banana bag @ 100 ml/hr as well as some more fluids after that. PT was consulted in regards to his unsteadiness on his feet. They recommended HHPT for the patient, which was set up on discharge. The patient  did not have any other episodes of falls in the hospital and no seizures, syncope, or other causes of LOC. Troponins were also cycled and found to be negative. No stroke seen on MRI.   3. DM2 - The patient has well controlled DM, w/ a HbA1c of 5.8 and CBGs b/w 106-115. He was placed on SSI during his admission. No issues w/ DM.  #HTN - BP was stable during hospitalization. His home dose of lisinopril was held during admission.  #Incontinence - On oxybutynin 5mg  BID at home. 1 episode in ED. Held in hospital b/c of NPO status durin gintital portion of hospitalization. No issues w/ incontinence in hospital.  #HLD - Simvastatin held in hospital.  Discharge Vitals:   BP 140/88  Pulse 76  Temp(Src) 98.5 F (36.9 C) (Oral)  Resp 20  Ht 5\' 8"  (1.727 m)  Wt 177 lb 11.2 oz (80.604 kg)  BMI 27.03 kg/m2  SpO2 100%  Discharge  Labs:  No results found for this or any previous visit (from the past 24 hour(s)).  Signed: Luanne Bras, MD 05/14/2013, 1:25 PM   Time Spent on Discharge: 35 minutes Services Ordered on Discharge: none Equipment Ordered on Discharge: none

## 2013-05-17 ENCOUNTER — Telehealth: Payer: Self-pay | Admitting: *Deleted

## 2013-05-17 NOTE — Discharge Summary (Signed)
  Date: 05/17/2013  Patient name: William [name]  Medical record number: [id number]  Date of birth: 01-26-1946   This patient has been seen and the plan of care was discussed with the house staff. Please see their note for complete details. I concur with their findings with the following additions/corrections:  Agree with the plan as outlined by Dr. Ronnald Ramp. Patient admitted to being significantly intoxicated the day of his witnessed episode of loss of consciousness.  Carlyle Basques, MD 05/17/2013, 9:47 PM

## 2013-05-17 NOTE — Telephone Encounter (Signed)
Ok, that's fine. I haven't seen him in almost a year so assume it was ordered during his last hospitalization. Will he get a call reminding him about his visit next week?  Dr. Doug Sou

## 2013-05-17 NOTE — Telephone Encounter (Signed)
Call from Petty, at [facility] [phone] She went out to pt's home for a Physical Therapy referral.  Pt is ambulation without problem.  He is not having any skilled needs that require Home health services.  He will not be receiving Home Health.

## 2013-05-18 NOTE — Telephone Encounter (Signed)
YES

## 2013-05-23 ENCOUNTER — Ambulatory Visit (INDEPENDENT_AMBULATORY_CARE_PROVIDER_SITE_OTHER): Payer: Medicare Other | Admitting: Internal Medicine

## 2013-05-23 ENCOUNTER — Ambulatory Visit: Payer: Medicare Other | Admitting: Internal Medicine

## 2013-05-23 VITALS — BP 164/104 | HR 98 | Temp 99.1°F | Wt 184.5 lb

## 2013-05-23 DIAGNOSIS — W19XXXA Unspecified fall, initial encounter: Secondary | ICD-10-CM

## 2013-05-23 DIAGNOSIS — E119 Type 2 diabetes mellitus without complications: Secondary | ICD-10-CM

## 2013-05-23 DIAGNOSIS — F1010 Alcohol abuse, uncomplicated: Secondary | ICD-10-CM

## 2013-05-23 DIAGNOSIS — Z8669 Personal history of other diseases of the nervous system and sense organs: Secondary | ICD-10-CM

## 2013-05-23 DIAGNOSIS — I10 Essential (primary) hypertension: Secondary | ICD-10-CM

## 2013-05-23 DIAGNOSIS — R0602 Shortness of breath: Secondary | ICD-10-CM

## 2013-05-23 DIAGNOSIS — Z9181 History of falling: Secondary | ICD-10-CM

## 2013-05-23 DIAGNOSIS — W19XXXD Unspecified fall, subsequent encounter: Secondary | ICD-10-CM

## 2013-05-23 DIAGNOSIS — E785 Hyperlipidemia, unspecified: Secondary | ICD-10-CM

## 2013-05-23 LAB — GLUCOSE, CAPILLARY: Glucose-Capillary: 112 mg/dL — ABNORMAL HIGH (ref 70–99)

## 2013-05-23 MED ORDER — ALBUTEROL SULFATE HFA 108 (90 BASE) MCG/ACT IN AERS: 2.0000 | INHALATION_SPRAY | Freq: Four times a day (QID) | RESPIRATORY_TRACT | Status: DC | PRN

## 2013-05-23 NOTE — Patient Instructions (Addendum)
-  Continue taking all your medications as instructed.  -Our social worker will call in regards to facilities that you may move into.  -Follow up with your PCP in 1-2 months for evaluation of fall, your blood pressure, and diabetes.

## 2013-05-24 ENCOUNTER — Encounter: Payer: Self-pay | Admitting: Internal Medicine

## 2013-05-24 ENCOUNTER — Ambulatory Visit: Payer: Medicare Other | Admitting: Internal Medicine

## 2013-05-24 ENCOUNTER — Telehealth: Payer: Self-pay | Admitting: Licensed Clinical Social Worker

## 2013-05-24 DIAGNOSIS — W19XXXA Unspecified fall, initial encounter: Secondary | ICD-10-CM | POA: Insufficient documentation

## 2013-05-24 LAB — LIPID PANEL
Cholesterol: 137 mg/dL (ref 0–200)
HDL: 61 mg/dL (ref >39)
Total CHOL/HDL Ratio: 2.2 Ratio

## 2013-05-24 NOTE — Assessment & Plan Note (Signed)
He states that he is cutting back on amount of alcohol intake, down to 1 40oz beer every other day. He continues to chew tobacco. I counseled him on further alcohol reduction and tobacco chewing cessation. He wants to continue chewing tobacco as it "calms his nerves".

## 2013-05-24 NOTE — Progress Notes (Signed)
Case discussed with Dr. Kennerly at the time of the visit.  We reviewed the resident's history and exam and pertinent patient test results.  I agree with the assessment, diagnosis, and plan of care documented in the resident's note. 

## 2013-05-24 NOTE — Assessment & Plan Note (Signed)
Pt wants to "take care of these big problems" as far as placement and [facility] services before being referred for eye exam. He wants to see the doctor he has seen before.

## 2013-05-24 NOTE — Assessment & Plan Note (Signed)
BP Readings from Last 3 Encounters:  05/23/13 164/104  05/11/13 140/88  02/10/13 169/100    Lab Results  Component Value Date   NA 133* 05/11/2013   K 3.4* 05/11/2013   CREATININE 1.01 05/11/2013    Assessment: Blood pressure control:  Elevated Progress toward BP goal:   deteriorated.  Comments:Pt is on HCTZ 25mg  but states that he did not know he had this medication for B. He is not sure of the last time he took this medication.   Plan: Medications:  continue current medications. Pt wrote "BP" on top of his HCTZ bottle and will start taking this medication everyday.  Educational resources provided:   Self management tools provided:   Other plans: No medication change at this time, will recheck BP during his next visit and assure medication compliance. Pt with referral to CSW for possible [facility] services including RN for medication assistance for improved compliance.

## 2013-05-24 NOTE — Assessment & Plan Note (Addendum)
Pt with witnessed fall at home thought to be 2/2 to alcohol intoxication and unsteady gait, hospitalized, discharged home on 8/13 with [facility] PT. PT has seen him at home and determined that he does not need [facility] services. Pt is interested in [facility] services and possibly ALF if they allow him to garden.  Referral to CSW for [facility] service options and ALF options.  Pt provided updated phone numbers, including his cell phone number, for CSW contact.

## 2013-05-24 NOTE — Assessment & Plan Note (Signed)
Lab Results  Component Value Date   HGBA1C 5.8* 05/10/2013   HGBA1C 6.2 01/31/2013   HGBA1C 6.4* 08/21/2012     Assessment: Diabetes control:  Controlled Progress toward A1C goal:   At goal Comments: Diet controlled  Plan: Medications:  no medication Home glucose monitoring: Frequency:   Timing:   Instruction/counseling given: reminded to get eye exam, reminded to bring medications to each visit and discussed foot care Educational resources provided:   Self management tools provided:   Other plans: Pt is due for an eye exam, wants to see his previous eye doctor. Diabetic foot exam performed during this visit.

## 2013-05-24 NOTE — Progress Notes (Signed)
  Subjective:    Patient ID: William [name], male    DOB: 1946-01-17, 67 y.o.   MRN: [id number]  HPI [name] is a 67 year old man with PMH significant for HTN, and alcohol abuse who comes in for hospital follow up visit for a fall. He had a witnessed fall with no LOC or injuries. His fall was suspected as a result of alcohol intoxication and unsteady gait. He denies falls since his discharge from the hospital, he continues to use his walker for ambulation. His surrogate daughter has moved out of the house and he now lives alone. His neighbor and girlfriend check on him "once in a while", he denies 24hr supervision. He brought his medication bottles with him but was not aware that he had a blood pressure medication and had not been taking it. He has been drinking less alcohol, with one 40oz beer every other day or when he is upset. He explains that prior to his fall, his surrogate daughter had made him "crazy" and had drank more than usual.  Of note, he missed his bus earlier in the day and therefore missed his morning appointment. He was seen in the afternoon, as an add-on.    Review of Systems  Constitutional: Negative for fever, chills, diaphoresis, activity change, appetite change, fatigue and unexpected weight change.  Respiratory: Negative for cough and shortness of breath.   Cardiovascular: Negative for chest pain, palpitations and leg swelling.  Gastrointestinal: Negative for nausea and abdominal pain.  Genitourinary: Negative for dysuria.  Neurological: Negative for dizziness, syncope, light-headedness and headaches.  Psychiatric/Behavioral: Negative for confusion and agitation.       Objective:   Physical Exam  Nursing note and vitals reviewed. Constitutional: He is oriented to person, place, and time. He appears well-nourished. No distress.  Disheveled, clothing with strong urine odor.  Hard of hearing.  Chewing tobacco.   HENT:  Head: Normocephalic.  Several scars on his  scalp from cuts from previous falls.  Facial rosacea of the chin, cheeks.   Eyes: Conjunctivae are normal. No scleral icterus.  Cardiovascular: Normal rate and regular rhythm.   Pulmonary/Chest: Effort normal and breath sounds normal. No respiratory distress. He has no wheezes. He has no rales.  Abdominal: Soft.  Musculoskeletal: He exhibits no edema and no tenderness.  Thoracic kyphosis.   Neurological: He is alert and oriented to person, place, and time. Coordination abnormal.  Uses walker to stabilize gait.   Skin: Skin is warm and dry. No rash noted. He is not diaphoretic. No pallor.  Psychiatric: He has a normal mood and affect.          Assessment & Plan:

## 2013-05-24 NOTE — Addendum Note (Signed)
Addended by: Adele Barthel D on: 05/24/2013 02:28 PM   Modules accepted: Level of Service

## 2013-05-24 NOTE — Assessment & Plan Note (Signed)
Checked lipid panel during this visit.

## 2013-05-24 NOTE — Telephone Encounter (Signed)
CSW received call from [place], pt was not assessed by [facility] RN nor MSW only [facility] PT which d/c services.  CSW will speak with pt to inquired if [facility] RN/MSW would be accepted.  [facility] will need new order for services faxed.  Awaiting return call from William [name].

## 2013-05-24 NOTE — Telephone Encounter (Signed)
William [name] was referred to CSW for assisted living referral information.  Pt had a recent admission and declined SNF placement upon d/c.  Pt was set up with [facility] services through [employer].

## 2013-05-25 NOTE — Telephone Encounter (Signed)
CSW placed call to phone number listed as "home" in EMR.  Male answered and states pt does not live there she just takes messages for him.  Male did not have a better contact number for pt.  CSW left message with contact information.

## 2013-06-01 NOTE — Telephone Encounter (Signed)
CSW placed called to pt.  CSW left message requesting return call. CSW provided contact hours and phone number. 

## 2013-06-09 NOTE — Telephone Encounter (Signed)
CSW has not received any contact from William [name].  CSW will send letter.

## 2013-06-24 ENCOUNTER — Encounter: Payer: Medicare Other | Admitting: Internal Medicine

## 2013-06-24 ENCOUNTER — Encounter: Payer: Self-pay | Admitting: Internal Medicine

## 2013-08-19 ENCOUNTER — Encounter: Payer: Medicare Other | Admitting: Internal Medicine

## 2013-08-19 ENCOUNTER — Encounter: Payer: Self-pay | Admitting: Internal Medicine

## 2013-09-26 ENCOUNTER — Telehealth: Payer: Self-pay | Admitting: Dietician

## 2013-09-26 NOTE — Telephone Encounter (Signed)
Calling to follow up on eye exam and missed appointments. Unable to reach by phone.

## 2013-10-12 NOTE — Telephone Encounter (Signed)
Letter sent to current address was returned

## 2013-12-14 ENCOUNTER — Encounter (HOSPITAL_COMMUNITY): Payer: Self-pay | Admitting: Emergency Medicine

## 2013-12-14 DIAGNOSIS — I10 Essential (primary) hypertension: Secondary | ICD-10-CM | POA: Diagnosis not present

## 2013-12-14 DIAGNOSIS — E871 Hypo-osmolality and hyponatremia: Secondary | ICD-10-CM | POA: Diagnosis present

## 2013-12-14 DIAGNOSIS — R630 Anorexia: Secondary | ICD-10-CM | POA: Diagnosis present

## 2013-12-14 DIAGNOSIS — F3289 Other specified depressive episodes: Secondary | ICD-10-CM | POA: Diagnosis present

## 2013-12-14 DIAGNOSIS — F329 Major depressive disorder, single episode, unspecified: Secondary | ICD-10-CM | POA: Diagnosis present

## 2013-12-14 DIAGNOSIS — B961 Klebsiella pneumoniae [K. pneumoniae] as the cause of diseases classified elsewhere: Secondary | ICD-10-CM | POA: Diagnosis present

## 2013-12-14 DIAGNOSIS — H9190 Unspecified hearing loss, unspecified ear: Secondary | ICD-10-CM | POA: Diagnosis present

## 2013-12-14 DIAGNOSIS — R6889 Other general symptoms and signs: Secondary | ICD-10-CM | POA: Diagnosis not present

## 2013-12-14 DIAGNOSIS — N390 Urinary tract infection, site not specified: Secondary | ICD-10-CM | POA: Diagnosis not present

## 2013-12-14 DIAGNOSIS — F1010 Alcohol abuse, uncomplicated: Secondary | ICD-10-CM | POA: Diagnosis present

## 2013-12-14 DIAGNOSIS — F411 Generalized anxiety disorder: Secondary | ICD-10-CM | POA: Diagnosis present

## 2013-12-14 DIAGNOSIS — E785 Hyperlipidemia, unspecified: Secondary | ICD-10-CM | POA: Diagnosis present

## 2013-12-14 DIAGNOSIS — M6281 Muscle weakness (generalized): Secondary | ICD-10-CM | POA: Diagnosis present

## 2013-12-14 DIAGNOSIS — F17200 Nicotine dependence, unspecified, uncomplicated: Secondary | ICD-10-CM | POA: Diagnosis present

## 2013-12-14 DIAGNOSIS — E876 Hypokalemia: Secondary | ICD-10-CM | POA: Diagnosis present

## 2013-12-14 DIAGNOSIS — E119 Type 2 diabetes mellitus without complications: Secondary | ICD-10-CM | POA: Diagnosis present

## 2013-12-14 DIAGNOSIS — R5381 Other malaise: Secondary | ICD-10-CM | POA: Diagnosis not present

## 2013-12-14 DIAGNOSIS — R112 Nausea with vomiting, unspecified: Secondary | ICD-10-CM | POA: Diagnosis not present

## 2013-12-14 LAB — COMPREHENSIVE METABOLIC PANEL
ALBUMIN: 2.9 g/dL — AB (ref 3.5–5.2)
ALT: 8 U/L (ref 0–53)
AST: 18 U/L (ref 0–37)
Alkaline Phosphatase: 83 U/L (ref 39–117)
BUN: 11 mg/dL (ref 6–23)
CALCIUM: 8.9 mg/dL (ref 8.4–10.5)
CO2: 27 meq/L (ref 19–32)
CREATININE: 1.13 mg/dL (ref 0.50–1.35)
Chloride: 82 mEq/L — ABNORMAL LOW (ref 96–112)
GFR calc Af Amer: 74 mL/min — ABNORMAL LOW (ref >90)
GFR, EST NON AFRICAN AMERICAN: 64 mL/min — AB (ref >90)
Glucose, Bld: 175 mg/dL — ABNORMAL HIGH (ref 70–99)
Potassium: 3.3 mEq/L — ABNORMAL LOW (ref 3.7–5.3)
SODIUM: 124 meq/L — AB (ref 137–147)
TOTAL PROTEIN: 7.1 g/dL (ref 6.0–8.3)
Total Bilirubin: 0.5 mg/dL (ref 0.3–1.2)

## 2013-12-14 LAB — CBC WITH DIFFERENTIAL/PLATELET
BASOS ABS: 0 10*3/uL (ref 0.0–0.1)
Basophils Relative: 0 % (ref 0–1)
EOS ABS: 0.1 10*3/uL (ref 0.0–0.7)
Eosinophils Relative: 1 % (ref 0–5)
HCT: 32.9 % — ABNORMAL LOW (ref 39.0–52.0)
Hemoglobin: 11.5 g/dL — ABNORMAL LOW (ref 13.0–17.0)
LYMPHS ABS: 1.8 10*3/uL (ref 0.7–4.0)
Lymphocytes Relative: 18 % (ref 12–46)
MCH: 30.8 pg (ref 26.0–34.0)
MCHC: 35 g/dL (ref 30.0–36.0)
MCV: 88.2 fL (ref 78.0–100.0)
MONO ABS: 0.7 10*3/uL (ref 0.1–1.0)
Monocytes Relative: 7 % (ref 3–12)
NEUTROS PCT: 74 % (ref 43–77)
Neutro Abs: 7.2 10*3/uL (ref 1.7–7.7)
PLATELETS: 281 10*3/uL (ref 150–400)
RBC: 3.73 MIL/uL — ABNORMAL LOW (ref 4.22–5.81)
RDW: 14.4 % (ref 11.5–15.5)
WBC: 9.8 10*3/uL (ref 4.0–10.5)

## 2013-12-14 LAB — LIPASE, BLOOD: Lipase: 14 U/L (ref 11–59)

## 2013-12-14 NOTE — ED Notes (Signed)
Unable to get out of bed x 2 weeks. N/v x 2 days. No abd. Pain.

## 2013-12-14 NOTE — ED Notes (Signed)
Per EMS, pt c/o N/V, chills, and weakness x3 days. Hx of ETOH/depression. Apt with outpatient in AM

## 2013-12-15 ENCOUNTER — Inpatient Hospital Stay (HOSPITAL_COMMUNITY)
Admission: EM | Admit: 2013-12-15 | Discharge: 2013-12-17 | DRG: 690 | Disposition: A | Discharge status: Home / Self Care | Payer: Medicare Other | Attending: Internal Medicine | Admitting: Internal Medicine

## 2013-12-15 ENCOUNTER — Ambulatory Visit: Payer: Medicare Other | Admitting: Internal Medicine

## 2013-12-15 DIAGNOSIS — N390 Urinary tract infection, site not specified: Secondary | ICD-10-CM | POA: Diagnosis not present

## 2013-12-15 DIAGNOSIS — F1011 Alcohol abuse, in remission: Secondary | ICD-10-CM | POA: Diagnosis present

## 2013-12-15 DIAGNOSIS — F1010 Alcohol abuse, uncomplicated: Secondary | ICD-10-CM

## 2013-12-15 DIAGNOSIS — E876 Hypokalemia: Secondary | ICD-10-CM

## 2013-12-15 DIAGNOSIS — R5381 Other malaise: Secondary | ICD-10-CM | POA: Diagnosis not present

## 2013-12-15 DIAGNOSIS — R531 Weakness: Secondary | ICD-10-CM

## 2013-12-15 DIAGNOSIS — R5383 Other fatigue: Secondary | ICD-10-CM | POA: Diagnosis not present

## 2013-12-15 DIAGNOSIS — E871 Hypo-osmolality and hyponatremia: Secondary | ICD-10-CM | POA: Diagnosis not present

## 2013-12-15 DIAGNOSIS — E119 Type 2 diabetes mellitus without complications: Secondary | ICD-10-CM | POA: Diagnosis present

## 2013-12-15 DIAGNOSIS — F411 Generalized anxiety disorder: Secondary | ICD-10-CM | POA: Diagnosis present

## 2013-12-15 DIAGNOSIS — F341 Dysthymic disorder: Secondary | ICD-10-CM

## 2013-12-15 DIAGNOSIS — I10 Essential (primary) hypertension: Secondary | ICD-10-CM

## 2013-12-15 DIAGNOSIS — F329 Major depressive disorder, single episode, unspecified: Secondary | ICD-10-CM | POA: Diagnosis present

## 2013-12-15 DIAGNOSIS — R112 Nausea with vomiting, unspecified: Secondary | ICD-10-CM

## 2013-12-15 DIAGNOSIS — E785 Hyperlipidemia, unspecified: Secondary | ICD-10-CM | POA: Diagnosis present

## 2013-12-15 DIAGNOSIS — F32A Depression, unspecified: Secondary | ICD-10-CM | POA: Diagnosis present

## 2013-12-15 LAB — BASIC METABOLIC PANEL
BUN: 11 mg/dL (ref 6–23)
BUN: 14 mg/dL (ref 6–23)
CALCIUM: 8.9 mg/dL (ref 8.4–10.5)
CHLORIDE: 84 meq/L — AB (ref 96–112)
CHLORIDE: 86 meq/L — AB (ref 96–112)
CO2: 27 meq/L (ref 19–32)
CO2: 27 meq/L (ref 19–32)
CREATININE: 1.15 mg/dL (ref 0.50–1.35)
CREATININE: 1.29 mg/dL (ref 0.50–1.35)
Calcium: 8.2 mg/dL — ABNORMAL LOW (ref 8.4–10.5)
GFR calc Af Amer: 63 mL/min — ABNORMAL LOW (ref >90)
GFR calc non Af Amer: 63 mL/min — ABNORMAL LOW (ref >90)
GFR calc non Af Amer: 55 mL/min — ABNORMAL LOW (ref >90)
GFR, EST AFRICAN AMERICAN: 73 mL/min — AB (ref >90)
Glucose, Bld: 186 mg/dL — ABNORMAL HIGH (ref 70–99)
Glucose, Bld: 170 mg/dL — ABNORMAL HIGH (ref 70–99)
Potassium: 3.3 mEq/L — ABNORMAL LOW (ref 3.7–5.3)
Potassium: 3.6 mEq/L — ABNORMAL LOW (ref 3.7–5.3)
SODIUM: 127 meq/L — AB (ref 137–147)
Sodium: 125 mEq/L — ABNORMAL LOW (ref 137–147)

## 2013-12-15 LAB — URINALYSIS, ROUTINE W REFLEX MICROSCOPIC
Bilirubin Urine: NEGATIVE
GLUCOSE, UA: 250 mg/dL — AB
Ketones, ur: NEGATIVE mg/dL
Nitrite: POSITIVE — AB
PROTEIN: NEGATIVE mg/dL
SPECIFIC GRAVITY, URINE: 1.009 (ref 1.005–1.030)
Urobilinogen, UA: 1 mg/dL (ref 0.0–1.0)
pH: 6 (ref 5.0–8.0)

## 2013-12-15 LAB — URINE MICROSCOPIC-ADD ON

## 2013-12-15 LAB — NA AND K (SODIUM & POTASSIUM), RAND UR
Potassium Urine: 12 mEq/L
Sodium, Ur: 49 mEq/L

## 2013-12-15 LAB — CBC
HEMATOCRIT: 31.1 % — AB (ref 39.0–52.0)
Hemoglobin: 11 g/dL — ABNORMAL LOW (ref 13.0–17.0)
MCH: 31.2 pg (ref 26.0–34.0)
MCHC: 35.4 g/dL (ref 30.0–36.0)
MCV: 88.1 fL (ref 78.0–100.0)
PLATELETS: 233 10*3/uL (ref 150–400)
RBC: 3.53 MIL/uL — AB (ref 4.22–5.81)
RDW: 14.5 % (ref 11.5–15.5)
WBC: 7.7 10*3/uL (ref 4.0–10.5)

## 2013-12-15 LAB — UREA NITROGEN, URINE: UREA NITROGEN UR: 163 mg/dL

## 2013-12-15 LAB — MAGNESIUM: Magnesium: 1.4 mg/dL — ABNORMAL LOW (ref 1.5–2.5)

## 2013-12-15 LAB — ETHANOL

## 2013-12-15 MED ORDER — SODIUM CHLORIDE 0.9 % IJ SOLN: 3.0000 mL | INTRAMUSCULAR | Status: DC | PRN

## 2013-12-15 MED ORDER — SODIUM CHLORIDE 0.9 % IV SOLN: 250.0000 mL | INTRAVENOUS | Status: DC | PRN

## 2013-12-15 MED ORDER — CIPROFLOXACIN HCL 500 MG PO TABS
500.0000 mg | ORAL_TABLET | Freq: Two times a day (BID) | ORAL | Status: DC
  Administered 2013-12-15 – 2013-12-17 (×5): 500 mg via ORAL
  Filled 2013-12-15 (×7): qty 1

## 2013-12-15 MED ORDER — INFLUENZA VAC SPLIT QUAD 0.5 ML IM SUSP
0.5000 mL | INTRAMUSCULAR | Status: AC
  Administered 2013-12-16: 0.5 mL via INTRAMUSCULAR
  Filled 2013-12-15: qty 0.5

## 2013-12-15 MED ORDER — LORAZEPAM 1 MG PO TABS: 1.0000 mg | ORAL_TABLET | Freq: Four times a day (QID) | ORAL | Status: DC | PRN

## 2013-12-15 MED ORDER — HEPARIN SODIUM (PORCINE) 5000 UNIT/ML IJ SOLN
5000.0000 [IU] | Freq: Three times a day (TID) | INTRAMUSCULAR | Status: DC
  Administered 2013-12-15 – 2013-12-17 (×6): 5000 [IU] via SUBCUTANEOUS
  Filled 2013-12-15 (×10): qty 1

## 2013-12-15 MED ORDER — ALBUTEROL SULFATE (2.5 MG/3ML) 0.083% IN NEBU: 3.0000 mL | INHALATION_SOLUTION | Freq: Four times a day (QID) | RESPIRATORY_TRACT | Status: DC | PRN

## 2013-12-15 MED ORDER — ADULT MULTIVITAMIN W/MINERALS CH
1.0000 | ORAL_TABLET | Freq: Every day | ORAL | Status: DC
  Administered 2013-12-15 – 2013-12-17 (×3): 1 via ORAL
  Filled 2013-12-15 (×3): qty 1

## 2013-12-15 MED ORDER — SODIUM CHLORIDE 0.9 % IJ SOLN
3.0000 mL | Freq: Two times a day (BID) | INTRAMUSCULAR | Status: DC
  Administered 2013-12-15: 3 mL via INTRAVENOUS

## 2013-12-15 MED ORDER — SODIUM CHLORIDE 0.9 % IJ SOLN: 3.0000 mL | Freq: Two times a day (BID) | INTRAMUSCULAR | Status: DC

## 2013-12-15 MED ORDER — FOLIC ACID 1 MG PO TABS
1.0000 mg | ORAL_TABLET | Freq: Every day | ORAL | Status: DC
  Administered 2013-12-15 – 2013-12-17 (×3): 1 mg via ORAL
  Filled 2013-12-15 (×3): qty 1

## 2013-12-15 MED ORDER — VITAMIN B-1 100 MG PO TABS
100.0000 mg | ORAL_TABLET | Freq: Every day | ORAL | Status: DC
  Administered 2013-12-15 – 2013-12-17 (×3): 100 mg via ORAL
  Filled 2013-12-15 (×3): qty 1

## 2013-12-15 MED ORDER — CEFTRIAXONE SODIUM 1 G IJ SOLR: 1.0000 g | INTRAMUSCULAR | Status: DC

## 2013-12-15 MED ORDER — POTASSIUM CHLORIDE CRYS ER 20 MEQ PO TBCR: 40.0000 meq | EXTENDED_RELEASE_TABLET | Freq: Once | ORAL | Status: DC

## 2013-12-15 MED ORDER — PNEUMOCOCCAL VAC POLYVALENT 25 MCG/0.5ML IJ INJ
0.5000 mL | INJECTION | INTRAMUSCULAR | Status: AC
  Administered 2013-12-16: 0.5 mL via INTRAMUSCULAR
  Filled 2013-12-15: qty 0.5

## 2013-12-15 MED ORDER — OXYBUTYNIN CHLORIDE 5 MG PO TABS
5.0000 mg | ORAL_TABLET | Freq: Every day | ORAL | Status: DC
  Administered 2013-12-15 – 2013-12-17 (×3): 5 mg via ORAL
  Filled 2013-12-15 (×3): qty 1

## 2013-12-15 MED ORDER — SODIUM CHLORIDE 0.9 % IV SOLN
INTRAVENOUS | Status: DC
  Administered 2013-12-15 – 2013-12-17 (×5): via INTRAVENOUS

## 2013-12-15 MED ORDER — DEXTROSE 5 % IV SOLN
1.0000 g | Freq: Once | INTRAVENOUS | Status: AC
  Administered 2013-12-15: 1 g via INTRAVENOUS
  Filled 2013-12-15: qty 10

## 2013-12-15 MED ORDER — FLUOXETINE HCL 20 MG PO TABS
40.0000 mg | ORAL_TABLET | Freq: Every day | ORAL | Status: DC
  Administered 2013-12-15 – 2013-12-17 (×3): 40 mg via ORAL
  Filled 2013-12-15 (×3): qty 2

## 2013-12-15 MED ORDER — THIAMINE HCL 100 MG/ML IJ SOLN
100.0000 mg | Freq: Every day | INTRAMUSCULAR | Status: DC
  Filled 2013-12-15 (×3): qty 1

## 2013-12-15 MED ORDER — LORAZEPAM 2 MG/ML IJ SOLN: 1.0000 mg | Freq: Four times a day (QID) | INTRAMUSCULAR | Status: DC | PRN

## 2013-12-15 NOTE — H&P (Signed)
INTERNAL MEDICINE TEACHING ATTENDING NOTE  Day 0 of stay  Patient name: William [name]  MRN: [id number] Date of birth: [birth year]   68 y.o. man past medical history - DM2, HTN, issues with alcohol abuse, admitted with nausea, and generalized weakness and diagnosed with hyponatremia, UTI hypovolemia, likely secondary to poor nutrition. He also reported suffering from nausea and vomiting which has not happened so far during admission. He is sleepy when I visit him, but says that he feels much better.   PHYSICAL EXAM   Vitals BP 121/91  HR 76  RR 18  SpO2 99 RA  Temp Normal  Pain 0   Exam General Sleepy, no distress apparent  HEENT PERRLA  Cardiac RRR, S1S2 normal, no murmur  Respiratory Vesicular breath sounds, no abnormal sounds  Abdomen Soft, non tender  Neuro Alert and oriented times 3  Extremities No pedal edema   Labs and imaging reviewed.    Assessment and Plan   I have read Dr. Ronnald Ramp and Dr Laroy Apple notes and I agree with the plan of care.  Hyponatremia secondary to poor nutritional status, hypovolemia, GI losses and possibly alcohol intake. Improvement in sodium on NS noted. Agree with change to PO antibiotics for UTI. If steady improvement, would expect discharge tomorrow.   I have seen and evaluated this patient and discussed it with my IM resident team.  Please see the rest of the plan per resident note from today.   [place], [place] 12/15/2013, 11:30 AM.

## 2013-12-15 NOTE — ED Provider Notes (Signed)
CSN: [id number]     Arrival date & time 12/14/13  1940 History   First MD Initiated Contact with Patient 12/15/13 0031     Chief Complaint  Patient presents with  . Nausea  . Weakness     (Consider location/radiation/quality/duration/timing/severity/associated sxs/prior Treatment) HPI 68 year old male presents to emergency room from home via EMS with complaint of generalized weakness.  Patient reports he normally gets around using a walker.  Over the last 2 weeks, he, reports he's had worsening weakness.  Patient reports nausea and vomiting over the last 2 days.  Eyes any fever.  He has had chills.  He denies any abdominal pain, diarrhea.  No urinary symptoms.  Patient reports he normally drinks 6 beers a day, has not drank for last 2 days, due to feeling unwell.  Patient has had chicken noodle soup, but vomited up the noodles. Past Medical History  Diagnosis Date  . Diabetes mellitus type II   . Hyperlipemia   . Anxiety   . Alcohol abuse   . Hearing impairment   . HTN (hypertension)   . Cataract   . Alcohol withdrawal seizure    History reviewed. No pertinent past surgical history. No family history on file. History  Substance Use Topics  . Smoking status: Current Every Day Smoker    Types: Cigars  . Smokeless tobacco: Current User    Types: Chew  . Alcohol Use: Yes     Comment: vodka--1/2 gal, heavy drinker x8 mos     Review of Systems  See History of Present Illness; otherwise all other systems are reviewed and negative   Allergies  Review of patient's allergies indicates no known allergies.  Home Medications   Current Outpatient Rx  Name  Route  Sig  Dispense  Refill  . albuterol (PROVENTIL HFA;VENTOLIN HFA) 108 (90 BASE) MCG/ACT inhaler   Inhalation   Inhale 2 puffs into the lungs every 6 (six) hours as needed for wheezing.   1 Inhaler   2     OPC sample   . FLUoxetine (PROZAC) 40 MG capsule   Oral   Take 1 capsule (40 mg total) by mouth daily.   90  capsule   3   . hydrochlorothiazide (HYDRODIURIL) 25 MG tablet   Oral   Take 25 mg by mouth daily.         Marland Kitchen OVER THE COUNTER MEDICATION   Topical   Apply 1 application topically daily as needed (for eczema/dry skin).         Marland Kitchen OVER THE COUNTER MEDICATION   Oral   Take 1 tablet by mouth daily as needed (pain). Arthritis pills         . oxybutynin (DITROPAN) 5 MG tablet   Oral   Take 5 mg by mouth daily.         . simvastatin (ZOCOR) 20 MG tablet   Oral   Take 1 tablet (20 mg total) by mouth at bedtime.   90 tablet   3    BP 115/93  Pulse 91  Temp(Src) 98.4 F (36.9 C)  Resp 18  SpO2 100% Physical Exam  Nursing note and vitals reviewed. Constitutional: He is oriented to person, place, and time. He appears well-developed and well-nourished.  HENT:  Head: Normocephalic and atraumatic.  Nose: Nose normal.  Mouth/Throat: Oropharynx is clear and moist.  Dry mucous membranes  Eyes: Conjunctivae and EOM are normal. Pupils are equal, round, and reactive to light.  Neck: Normal range of  motion. Neck supple. No JVD present. No tracheal deviation present. No thyromegaly present.  Cardiovascular: Normal rate, regular rhythm, normal heart sounds and intact distal pulses.  Exam reveals no gallop and no friction rub.   No murmur heard. Pulmonary/Chest: Effort normal and breath sounds normal. No stridor. No respiratory distress. He has no wheezes. He has no rales. He exhibits no tenderness.  Abdominal: Soft. Bowel sounds are normal. He exhibits no distension and no mass. There is no tenderness. There is no rebound and no guarding.  Musculoskeletal: Normal range of motion. He exhibits no edema and no tenderness.  Lymphadenopathy:    He has no cervical adenopathy.  Neurological: He is alert and oriented to person, place, and time. He exhibits normal muscle tone. Coordination normal.  Skin: Skin is warm and dry. No rash noted. No erythema. No pallor.  Psychiatric: He has a  normal mood and affect. His behavior is normal. Judgment and thought content normal.    ED Course  Procedures (including critical care time) Labs Review Labs Reviewed  CBC WITH DIFFERENTIAL - Abnormal; Notable for the following:    RBC 3.73 (*)    Hemoglobin 11.5 (*)    HCT 32.9 (*)    All other components within normal limits  COMPREHENSIVE METABOLIC PANEL - Abnormal; Notable for the following:    Sodium 124 (*)    Potassium 3.3 (*)    Chloride 82 (*)    Glucose, Bld 175 (*)    Albumin 2.9 (*)    GFR calc non Af Amer 64 (*)    GFR calc Af Amer 74 (*)    All other components within normal limits  URINALYSIS, ROUTINE W REFLEX MICROSCOPIC - Abnormal; Notable for the following:    APPearance TURBID (*)    Glucose, UA 250 (*)    Hgb urine dipstick SMALL (*)    Nitrite POSITIVE (*)    Leukocytes, UA LARGE (*)    All other components within normal limits  URINE MICROSCOPIC-ADD ON - Abnormal; Notable for the following:    Squamous Epithelial / LPF FEW (*)    Bacteria, UA MANY (*)    All other components within normal limits  LIPASE, BLOOD  ETHANOL   Imaging Review No results found.   EKG Interpretation None      MDM   Final diagnoses:  Hyponatremia  Weakness  UTI (lower urinary tract infection)   68 year old male with worsening generalized weakness over the last several weeks, nausea and vomiting.  Labs show that he has significant urinary tract infection, as well as hyponatremia.  Discussed with internal medicine, who will admit the patient.    Kalman Drape, MD 12/15/13 [phone]

## 2013-12-15 NOTE — Progress Notes (Signed)
Visit to patient while in hospital. Will call after patient is discharged to assist with transition of care. 

## 2013-12-15 NOTE — ED Notes (Signed)
Pt denies nausea and vomiting.  Pt was changed from being incontinent (gown and pad soaked).  Pt does not display signs or symptoms of withdrawal or shaking.  Pt is calm and cooperative.
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
## 2013-12-15 NOTE — Progress Notes (Signed)
UR completed. Patient changed to inpatient- requiring IVF@ 100cc/hr  
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
## 2013-12-15 NOTE — Progress Notes (Signed)
Subjective:  NAEON. Patient is feeling much better this AM.  Objective: Vital signs in last 24 hours: Filed Vitals:   12/15/13 0300 12/15/13 0330 12/15/13 0349 12/15/13 0442  BP: 108/59 108/67  121/91  Pulse: 69 67  76  Temp:   98.1 F (36.7 C) 98.3 F (36.8 C)  TempSrc:   Oral Oral  Resp:    18  Height:    5\' 8"  (1.727 m)  Weight:    168 lb (76.204 kg)  SpO2: 97% 99%  99%   Weight change:   Intake/Output Summary (Last 24 hours) at 12/15/13 1022 Last data filed at 12/15/13 0600  Gross per 24 hour  Intake      0 ml  Output    200 ml  Net   -200 ml   Physical Exam  Constitutional: He is oriented to person, place, and time. He appears well-developed and well-nourished. No distress.  Patient sleeping comfortably.  HENT:  Head: Normocephalic.  Mouth/Throat: Oropharynx is clear and moist. No oropharyngeal exudate.  Cardiovascular: Normal rate, regular rhythm, normal heart sounds and intact distal pulses.  Exam reveals no friction rub.   No murmur heard. Pulmonary/Chest: Effort normal and breath sounds normal. No respiratory distress. He has no wheezes. He has no rales.  Neurological: He is alert and oriented to person, place, and time.  Skin: He is not diaphoretic.  Psychiatric: He has a normal mood and affect. His behavior is normal.     Lab Results: Basic Metabolic Panel:  Recent Labs Lab 12/14/13 1952 12/15/13 0756  NA 124* 127*  K 3.3* 3.3*  CL 82* 84*  CO2 27 27  GLUCOSE 175* 186*  BUN 11 11  CREATININE 1.13 1.15  CALCIUM 8.9 8.9   Liver Function Tests:  Recent Labs Lab 12/14/13 1952  AST 18  ALT 8  ALKPHOS 83  BILITOT 0.5  PROT 7.1  ALBUMIN 2.9*    Recent Labs Lab 12/14/13 1952  LIPASE 14   No results found for this basename: AMMONIA,  in the last 168 hours CBC:  Recent Labs Lab 12/14/13 1952 12/15/13 0756  WBC 9.8 7.7  NEUTROABS 7.2  --   HGB 11.5* 11.0*  HCT 32.9* 31.1*  MCV 88.2 88.1  PLT 281 233   Urine Drug  Screen: Drugs of Abuse     Component Value Date/Time   LABOPIA NONE DETECTED 05/09/2013 2337   COCAINSCRNUR NONE DETECTED 05/09/2013 2337   LABBENZ NONE DETECTED 05/09/2013 2337   AMPHETMU NONE DETECTED 05/09/2013 2337   THCU NONE DETECTED 05/09/2013 2337   LABBARB NONE DETECTED 05/09/2013 2337    Alcohol Level:  Recent Labs Lab 12/15/13 0025  ETH <11   Urinalysis:  Recent Labs Lab 12/15/13 0025  COLORURINE YELLOW  LABSPEC 1.009  PHURINE 6.0  GLUCOSEU 250*  HGBUR SMALL*  BILIRUBINUR NEGATIVE  KETONESUR NEGATIVE  PROTEINUR NEGATIVE  UROBILINOGEN 1.0  NITRITE POSITIVE*  LEUKOCYTESUR LARGE*   Studies/Results: No results found. Medications: I have reviewed the patient's current medications. Scheduled Meds: . [START ON 12/16/2013] cefTRIAXone (ROCEPHIN)  IV  1 g Intravenous Q24H  . FLUoxetine  40 mg Oral Daily  . folic acid  1 mg Oral Daily  . heparin  5,000 Units Subcutaneous 3 times per day  . [START ON 12/16/2013] influenza vac split quadrivalent PF  0.5 mL Intramuscular Tomorrow-1000  . multivitamin with minerals  1 tablet Oral Daily  . oxybutynin  5 mg Oral Daily  . [START ON 12/16/2013] pneumococcal  23 valent vaccine  0.5 mL Intramuscular Tomorrow-1000  . potassium chloride  40 mEq Oral Once  . sodium chloride  3 mL Intravenous Q12H  . sodium chloride  3 mL Intravenous Q12H  . thiamine  100 mg Oral Daily   Or  . thiamine  100 mg Intravenous Daily   Continuous Infusions: . sodium chloride 100 mL/hr at 12/15/13 0440   PRN Meds:.sodium chloride, albuterol, LORazepam, LORazepam, sodium chloride Assessment/Plan: Active Problems:   DIABETES MELLITUS, TYPE II   HYPERLIPIDEMIA   ANXIETY   ALCOHOL ABUSE   DEPRESSION   Hypo-osmolality and hyponatremia   UTI (lower urinary tract infection)   Hypokalemia   Hyponatremia  Hyponatremia- Patient admitted w/ Na of 124, however, patient w/ recent h/o N/V. Also w/ decreased po intake given poor appetite 2/2 nausea. Claims  he has not drank alcohol for 2 days. Orthostatic vital signs positive in the ED. Hyponatremia likely hypovolemic in nature 2/2 GI losses. Urine sodium is low, making hypovolemia even more likely. Patient is prescribed HCTZ for HTN, known to cause hyponatremia, however, the patient has been taking this for quite some time. Oxybutinin not known to cause hyponatremia. SIADH not suspected at this time. Patient w/out cirrhosis or CHF, hypervolemic hyponatremia not suspected given clinical assessment and positive orthostatic changes.  -IVF; NS @ 100 ml/hr  -Hold HCTZ  -Repeat BMP in AM demonstrated improved sodium to 127  UTI- Patient w/ UA positive for nitrites and large leukocytes. No dysuria, however, patient w/ h/o urge incontinence, on oxybutinin. Not currently sexually active. No flank pain, no leukocytosis. Given Rocephin 1 g IV in ED.  -D/C Rocephin IV, deescalate to PO abx (cipro 500 bid for 5 days) -Urine culture pending  -Continue Oxybutinin   Generalized Weakness, nausea, vomiting- Much improved this am. -IVF as above  -Advance diet as tolerated.   Alcohol Abuse w/ h/o withdrawal seizures- Patient typically drinks 6 beers daily, but claims he has not drank for the past 2 days 2/2 his nausea and vomiting. Does not appear anxious or agitated at this time. Patient denies any recent seizure activity.  -CIWA protocol  -MTV + Thiamine   HTN- On HCTZ 25 mg po qd at home. Normotensive and w/ positive orthostatics.  -Hold HCTZ for now in the setting of hyponatremia   HLD- On Zocor at home  -Hold statin for now   Anxiety/Depression- On Prozac at home. No current issues. Denies anxiety or depressive symptoms  -Continue Prozac   DVT/PE PPx- Heparin [place]   Dispo: Disposition is deferred at this time, awaiting improvement of current medical problems.  Anticipated discharge in approximately 1 day(s).   The patient does have a current PCP Olga Millers, MD) and does need an [facility] hospital  follow-up appointment after discharge.  The patient does not know have transportation limitations that hinder transportation to clinic appointments.  .Services Needed at time of discharge: Y = Yes, Blank = No PT:   OT:   RN:   Equipment:   Other:     LOS: 0 days   Marrion Coy, MD 12/15/2013, 10:22 AM

## 2013-12-16 DIAGNOSIS — R5383 Other fatigue: Secondary | ICD-10-CM | POA: Diagnosis not present

## 2013-12-16 DIAGNOSIS — R112 Nausea with vomiting, unspecified: Secondary | ICD-10-CM | POA: Diagnosis not present

## 2013-12-16 DIAGNOSIS — R5381 Other malaise: Secondary | ICD-10-CM | POA: Diagnosis not present

## 2013-12-16 DIAGNOSIS — N390 Urinary tract infection, site not specified: Secondary | ICD-10-CM | POA: Diagnosis not present

## 2013-12-16 DIAGNOSIS — E871 Hypo-osmolality and hyponatremia: Secondary | ICD-10-CM | POA: Diagnosis not present

## 2013-12-16 LAB — BASIC METABOLIC PANEL
BUN: 15 mg/dL (ref 6–23)
BUN: 14 mg/dL (ref 6–23)
CHLORIDE: 92 meq/L — AB (ref 96–112)
CO2: 27 meq/L (ref 19–32)
CO2: 23 mEq/L (ref 19–32)
Calcium: 8.6 mg/dL (ref 8.4–10.5)
Calcium: 8.4 mg/dL (ref 8.4–10.5)
Chloride: 93 mEq/L — ABNORMAL LOW (ref 96–112)
Creatinine, Ser: 1.15 mg/dL (ref 0.50–1.35)
Creatinine, Ser: 1.08 mg/dL (ref 0.50–1.35)
GFR calc Af Amer: 73 mL/min — ABNORMAL LOW (ref >90)
GFR calc Af Amer: 78 mL/min — ABNORMAL LOW (ref >90)
GFR calc non Af Amer: 63 mL/min — ABNORMAL LOW (ref >90)
GFR calc non Af Amer: 68 mL/min — ABNORMAL LOW (ref >90)
GLUCOSE: 160 mg/dL — AB (ref 70–99)
GLUCOSE: 213 mg/dL — AB (ref 70–99)
POTASSIUM: 3.9 meq/L (ref 3.7–5.3)
Potassium: 3.7 mEq/L (ref 3.7–5.3)
Sodium: 132 mEq/L — ABNORMAL LOW (ref 137–147)
Sodium: 130 mEq/L — ABNORMAL LOW (ref 137–147)

## 2013-12-16 MED ORDER — ATORVASTATIN CALCIUM 40 MG PO TABS
40.0000 mg | ORAL_TABLET | Freq: Every day | ORAL | Status: DC
  Administered 2013-12-16: 40 mg via ORAL
  Filled 2013-12-16 (×2): qty 1

## 2013-12-16 MED ORDER — POTASSIUM CHLORIDE CRYS ER 20 MEQ PO TBCR
40.0000 meq | EXTENDED_RELEASE_TABLET | Freq: Once | ORAL | Status: AC
  Administered 2013-12-16: 40 meq via ORAL
  Filled 2013-12-16: qty 2

## 2013-12-16 NOTE — Progress Notes (Signed)
    Day 1 of stay      Patient name: William [name]  Medical record number: [id number]  Date of birth: May 19, 1946  I have seen and evaluated the patient today. He appears to be doing much better, however his sodium has trended down due to oral free water intake. Today we restricted his free water to 600 ml daily and we will observe. We will also obtain urine osmolality. I have read Dr. Harlon Flor note from today and I agree with the documentation.  [place], [place] 12/16/2013, 11:30 AM.

## 2013-12-16 NOTE — Progress Notes (Signed)
Subjective:  NAEON. Patient much improved. Tolerating PO with no nausea, vomiting, or diarrhea.  Objective: Vital signs in last 24 hours: Filed Vitals:   12/15/13 1206 12/15/13 2223 12/16/13 0023 12/16/13 0506  BP: 102/66 135/84 119/75   Pulse: 115 85 79   Temp:  98 F (36.7 C) 98.6 F (37 C)   TempSrc:  Oral Oral Oral  Resp:  18    Height:      Weight:    171 lb 1.2 oz (77.6 kg)  SpO2:  98% 100% 94%   Weight change: 3 lb 1.2 oz (1.396 kg)  Intake/Output Summary (Last 24 hours) at 12/16/13 [id number] Last data filed at 12/16/13 [id number]  Gross per 24 hour  Intake 2085.34 ml  Output   2950 ml  Net -864.66 ml   Physical Exam  Constitutional: He is oriented to person, place, and time. He appears well-developed and well-nourished. No distress.  Patient sleeping comfortably.  HENT:  Head: Normocephalic.  Mouth/Throat: Oropharynx is clear and moist. No oropharyngeal exudate.  Cardiovascular: Normal rate, regular rhythm, normal heart sounds and intact distal pulses.  Exam reveals no friction rub.   No murmur heard. Pulmonary/Chest: Effort normal and breath sounds normal. No respiratory distress. He has no wheezes. He has no rales.  Neurological: He is alert and oriented to person, place, and time.  Slurred speech similar to baseline.  Skin: He is not diaphoretic.  Psychiatric: He has a normal mood and affect. His behavior is normal.     Lab Results: Basic Metabolic Panel:  Recent Labs Lab 12/15/13 0756 12/15/13 2127 12/16/13 0620  NA 127* 125* 132*  K 3.3* 3.6* 3.7  CL 84* 86* 93*  CO2 27 27 27   GLUCOSE 186* 170* 160*  BUN 11 14 15   CREATININE 1.15 1.29 1.15  CALCIUM 8.9 8.2* 8.6  MG 1.4*  --   --    Liver Function Tests:  Recent Labs Lab 12/14/13 1952  AST 18  ALT 8  ALKPHOS 83  BILITOT 0.5  PROT 7.1  ALBUMIN 2.9*    Recent Labs Lab 12/14/13 1952  LIPASE 14   CBC:  Recent Labs Lab 12/14/13 1952 12/15/13 0756  WBC 9.8 7.7  NEUTROABS 7.2  --     HGB 11.5* 11.0*  HCT 32.9* 31.1*  MCV 88.2 88.1  PLT 281 233   Urine Drug Screen: Drugs of Abuse     Component Value Date/Time   LABOPIA NONE DETECTED 05/09/2013 2337   COCAINSCRNUR NONE DETECTED 05/09/2013 2337   LABBENZ NONE DETECTED 05/09/2013 2337   AMPHETMU NONE DETECTED 05/09/2013 2337   THCU NONE DETECTED 05/09/2013 2337   LABBARB NONE DETECTED 05/09/2013 2337    Alcohol Level:  Recent Labs Lab 12/15/13 0025  ETH <11   Urinalysis:  Recent Labs Lab 12/15/13 0025  COLORURINE YELLOW  LABSPEC 1.009  PHURINE 6.0  GLUCOSEU 250*  HGBUR SMALL*  BILIRUBINUR NEGATIVE  KETONESUR NEGATIVE  PROTEINUR NEGATIVE  UROBILINOGEN 1.0  NITRITE POSITIVE*  LEUKOCYTESUR LARGE*   Studies/Results: No results found. Medications: I have reviewed the patient's current medications. Scheduled Meds: . ciprofloxacin  500 mg Oral BID  . FLUoxetine  40 mg Oral Daily  . folic acid  1 mg Oral Daily  . heparin  5,000 Units Subcutaneous 3 times per day  . influenza vac split quadrivalent PF  0.5 mL Intramuscular Tomorrow-1000  . multivitamin with minerals  1 tablet Oral Daily  . oxybutynin  5 mg Oral Daily  .  pneumococcal 23 valent vaccine  0.5 mL Intramuscular Tomorrow-1000  . sodium chloride  3 mL Intravenous Q12H  . sodium chloride  3 mL Intravenous Q12H  . thiamine  100 mg Oral Daily   Or  . thiamine  100 mg Intravenous Daily   Continuous Infusions: . sodium chloride 125 mL/hr at 12/16/13 0536   PRN Meds:.sodium chloride, albuterol, LORazepam, LORazepam, sodium chloride Assessment/Plan: Active Problems:   DIABETES MELLITUS, TYPE II   HYPERLIPIDEMIA   ANXIETY   ALCOHOL ABUSE   DEPRESSION   Hypo-osmolality and hyponatremia   UTI (lower urinary tract infection)   Hypokalemia   Hyponatremia  Hyponatremia- Patient admitted w/ Na of 124. patient w/ recent h/o N/V. Also w/ decreased po intake given poor appetite 2/2 nausea. Claims he has not drank alcohol for 2 days. Orthostatic  vital signs positive in the ED. Hypoosmololar Hyponatremia likely 2/2 hypovolemia. Urine sodium is low, making hypovolemia even more likely. Patient is prescribed HCTZ for HTN, known to cause hyponatremia, however, the patient has been taking this for quite some time. Oxybutinin not known to cause hyponatremia. SIADH not suspected at this time. Patient w/out cirrhosis or CHF, hypervolemic hyponatremia not suspected given clinical assessment and positive orthostatic changes.  -IVF; NS @ 125 ml/hr  -Hold HCTZ  - Patient was given free water overnight despite order for no free water. Patient reports to drinking a large volume of free water. This likely represents why sodium decreased. Restrict PO free water to 600 cc. May have 1 cup of coffee. Encourage gatorade.  UTI- Patient w/ UA positive for nitrites and large leukocytes. No dysuria, however, patient w/ h/o urge incontinence, on oxybutinin. Not currently sexually active. No flank pain, no leukocytosis. Given Rocephin 1 g IV in ED.  -On PO abx (cipro 500 bid for 5 days) -Urine culture pending  -Continue Oxybutinin   Generalized Weakness, nausea, vomiting- Much improved this am. -IVFs as above   Alcohol Abuse w/ h/o withdrawal seizures- Patient typically drinks 6 beers daily, but claims he has not drank for the past 2 days 2/2 his nausea and vomiting. Does not appear anxious or agitated at this time. Patient denies any recent seizure activity.  -CIWA protocol  -MTV + Thiamine   HTN- On HCTZ 25 mg po qd at home. Normotensive and w/ positive orthostatics.  -Hold HCTZ for now in the setting of hyponatremia   HLD- On Zocor at home  -Start statin  Anxiety/Depression- On Prozac at home. No current issues. Denies anxiety or depressive symptoms  -Continue Prozac   DVT/PE PPx- Heparin [place]   Dispo: Disposition is deferred at this time, awaiting improvement of current medical problems.  Anticipated discharge in approximately 1 day(s).   The patient  does have a current PCP Olga Millers, MD) and does need an [facility] hospital follow-up appointment after discharge.  The patient does not know have transportation limitations that hinder transportation to clinic appointments.  .Services Needed at time of discharge: Y = Yes, Blank = No PT:   OT:   RN:   Equipment:   Other:     LOS: 1 day   Marrion Coy, MD 12/16/2013, 8:28 AM

## 2013-12-16 NOTE — Progress Notes (Signed)
Physical Therapy Treatment Patient Details Name: William [name] MRN: [id number] DOB: 04/03/1946 Today's Date: 12/16/2013 Time: [id number] PT Time Calculation (min): 21 min  PT Assessment / Plan / Recommendation  History of Present Illness 68 y.o. man past medical history - DM2, HTN, issues with alcohol abuse, admitted with nausea, and generalized weakness and diagnosed with hyponatremia, UTI hypovolemia, likely secondary to poor nutrition.    PT Comments   Pt is impulsive and unsteady on his feet even with RW use.  Cues and assist needed for safety.  This may be pt's baseline level of function, but it is certainly not safe.  He was able to demonstrate the physical ability to do stairs, but again, it was in an unsafe manner and the safety puts him at as high a risk of falling as the physical balance issues do.  PT will continue to follow acutely.    Follow Up Recommendations  Home health PT;Supervision/Assistance - 24 hour     Does the patient have the potential to tolerate intense rehabilitation    NA  Barriers to Discharge  None, sounds like he lives with a significant other and her daughters and that someone will be there with him.        Equipment Recommendations  3in1 (PT)    Recommendations for Other Services OT consult  Frequency Min 3X/week   Progress towards PT Goals Progress towards PT goals: Progressing toward goals  Plan Current plan remains appropriate    Precautions / Restrictions Precautions Precautions: Fall Precaution Comments: reports he has fallen at home    Pertinent Vitals/Pain DOE 2/4 with gait.     Mobility  Transfers Overall transfer level: Needs assistance Equipment used: Rolling walker (2 wheeled) Transfers: Sit to/from Stand Sit to Stand: Min guard General transfer comment: Min guard assist for balance during transitions.  Verbal cues for safe RW use and safe hand placement.  Ambulation/Gait Ambulation/Gait assistance: Min guard Ambulation  Distance (Feet): 200 Feet Assistive device: Rolling walker (2 wheeled);None Gait Pattern/deviations: Step-through pattern;Shuffle;Staggering left;Staggering right Gait velocity: decreased Gait velocity interpretation: Below normal speed for age/gender General Gait Details: staggering gait pattern especially when distracted using RW.  Pt easily distrated by trying to speak to staff in the hallway and frequently taking at least one hand off of the RW.   Stairs: Yes Stairs assistance: Min guard Stair Management: One rail Right;Two rails;Step to pattern;Forwards Number of Stairs: 4 General stair comments: pt needed min guard assist for safety and max verbal cues for safety during stair climbing.  Most safety cues were in reguard to his IV line and trying not to pull it out.        PT Goals (current goals can now be found in the care plan section) Acute Rehab PT Goals Patient Stated Goal: to get stronger  Visit Information  Last PT Received On: 12/16/13 Assistance Needed: +1 History of Present Illness: 68 y.o. man past medical history - DM2, HTN, issues with alcohol abuse, admitted with nausea, and generalized weakness and diagnosed with hyponatremia, UTI hypovolemia, likely secondary to poor nutrition.     Subjective Data  Subjective: Pt is yelling loudly when he talks.  He is very HOH. Patient Stated Goal: to get stronger   Cognition  Cognition Arousal/Alertness: Awake/alert Behavior During Therapy: Impulsive Overall Cognitive Status: No family/caregiver present to determine baseline cognitive functioning Area of Impairment: Safety/judgement Safety/Judgement: Decreased awareness of safety General Comments: Pt continues to be impulsive quick to move, totally unaware of lines  during gait and on stairs.     Balance  Balance Overall balance assessment: History of Falls Sitting-balance support: Feet supported Sitting balance-Leahy Scale: Good Standing balance support: Bilateral upper  extremity supported Standing balance-Leahy Scale: Poor Standing balance comment: needs min guard assist even with RW for balance.  General Comments General comments (skin integrity, edema, etc.): Increased DOE during gait 2/4.  Pt unable to tell me if he gets SOB at baseline at home.    End of Session PT - End of Session Equipment Utilized During Treatment: Gait belt Activity Tolerance: Patient tolerated treatment well Patient left: in bed;with call bell/phone within reach    [place] B. Ewing, Malcom, DPT [phone]   12/16/2013, 5:30 PM

## 2013-12-17 DIAGNOSIS — B961 Klebsiella pneumoniae [K. pneumoniae] as the cause of diseases classified elsewhere: Secondary | ICD-10-CM

## 2013-12-17 LAB — URINE CULTURE: Colony Count: >=100000

## 2013-12-17 LAB — BASIC METABOLIC PANEL
BUN: 11 mg/dL (ref 6–23)
CHLORIDE: 97 meq/L (ref 96–112)
CO2: 22 meq/L (ref 19–32)
CREATININE: 1.1 mg/dL (ref 0.50–1.35)
Calcium: 8.2 mg/dL — ABNORMAL LOW (ref 8.4–10.5)
GFR calc Af Amer: 77 mL/min — ABNORMAL LOW (ref >90)
GFR calc non Af Amer: 66 mL/min — ABNORMAL LOW (ref >90)
GLUCOSE: 159 mg/dL — AB (ref 70–99)
Potassium: 4.2 mEq/L (ref 3.7–5.3)
Sodium: 134 mEq/L — ABNORMAL LOW (ref 137–147)

## 2013-12-17 MED ORDER — CIPROFLOXACIN HCL 500 MG PO TABS: 500.0000 mg | ORAL_TABLET | Freq: Two times a day (BID) | ORAL | Status: DC

## 2013-12-17 NOTE — Progress Notes (Signed)
   CARE MANAGEMENT NOTE 12/17/2013  Patient:  THORTON, CSIZMADIA   Account Number:  [id number]  Date Initiated:  12/17/2013  Documentation initiated by:  [facility]  Subjective/Objective Assessment:   adm:  nausea, and generalized weakness     Action/Plan:   discharge planning   Anticipated DC Date:  12/17/2013   Anticipated DC Plan:  [place]  CM consult      [facility] Choice  HOME HEALTH   Choice offered to / List presented to:  C-1 Patient   DME arranged  3-N-1      DME agency  [place] arranged  [place].   Status of service:  Completed, signed off Medicare Important Message given?   (If response is "NO", the following Medicare IM given date fields will be blank) Date Medicare IM given:   Date Additional Medicare IM given:    Discharge Disposition:  [place]  Per UR Regulation:    If discussed at Long Length of Stay Meetings, dates discussed:    Comments:  12/17/13 15:48 CM spoke with pt to offer choice for HHPT. Pt states [facility] si fine.  Call made to have 3n1 delivered to room prior to discharge. Address and contact numbers verified with pt.  Referral texted to [facility] liason, [place]. No other CM needs were communicated.  Mariane Masters, BSN, CM [phone].
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
## 2013-12-17 NOTE — Progress Notes (Signed)
Subjective:  William [name]. Pt had adherence to fluid restriction. Having no pain or complaints this AM. Tolerating regular diet no nausea, vomiting, or diarrhea.  Objective: Vital signs in last 24 hours: Filed Vitals:   12/16/13 1750 12/16/13 2058 12/17/13 0005 12/17/13 0608  BP: 139/89 138/89 137/89 139/89  Pulse: 96 92 97 99  Temp: 99.2 F (37.3 C) 98.6 F (37 C) 99.4 F (37.4 C) 97.6 F (36.4 C)  TempSrc: Oral Oral Oral Oral  Resp:  19 18 18   Height:      Weight:    182 lb 12.2 oz (82.9 kg)  SpO2: 99% 99% 97% 99%   Weight change: 11 lb 11 oz (5.3 kg)  Intake/Output Summary (Last 24 hours) at 12/17/13 0902 Last data filed at 12/17/13 [id number]  Gross per 24 hour  Intake 3512.08 ml  Output   2170 ml  Net 1342.08 ml   Physical Exam  Constitutional: He is oriented to person, place, and time. He appears well-developed and well-nourished. No distress.  Patient sleeping comfortably.  HENT:  Head: Normocephalic.  Mouth/Throat: Oropharynx is clear and moist. No oropharyngeal exudate.  Cardiovascular: Normal rate, regular rhythm, normal heart sounds and intact distal pulses.  Exam reveals no friction rub.   No murmur heard. Pulmonary/Chest: Effort normal and breath sounds normal. No respiratory distress. He has no wheezes. He has no rales.  Neurological: He is alert and oriented to person, place, and time.  Slurred speech similar to baseline.  Skin: He is not diaphoretic.  Psychiatric: He has a normal mood and affect. His behavior is normal.     Lab Results: Basic Metabolic Panel:  Recent Labs Lab 12/15/13 0756  12/16/13 1848 12/17/13 0555  NA 127*  < > 130* 134*  K 3.3*  < > 3.9 4.2  CL 84*  < > 92* 97  CO2 27  < > 23 22  GLUCOSE 186*  < > 213* 159*  BUN 11  < > 14 11  CREATININE 1.15  < > 1.08 1.10  CALCIUM 8.9  < > 8.4 8.2*  MG 1.4*  --   --   --   < > = values in this interval not displayed. Liver Function Tests:  Recent Labs Lab 12/14/13 1952  AST 18  ALT 8   ALKPHOS 83  BILITOT 0.5  PROT 7.1  ALBUMIN 2.9*    Recent Labs Lab 12/14/13 1952  LIPASE 14   CBC:  Recent Labs Lab 12/14/13 1952 12/15/13 0756  WBC 9.8 7.7  NEUTROABS 7.2  --   HGB 11.5* 11.0*  HCT 32.9* 31.1*  MCV 88.2 88.1  PLT 281 233   Urine Drug Screen: Drugs of Abuse     Component Value Date/Time   LABOPIA NONE DETECTED 05/09/2013 2337   COCAINSCRNUR NONE DETECTED 05/09/2013 2337   LABBENZ NONE DETECTED 05/09/2013 2337   AMPHETMU NONE DETECTED 05/09/2013 2337   THCU NONE DETECTED 05/09/2013 2337   LABBARB NONE DETECTED 05/09/2013 2337    Alcohol Level:  Recent Labs Lab 12/15/13 0025  ETH <11   Urinalysis:  Recent Labs Lab 12/15/13 0025  COLORURINE YELLOW  LABSPEC 1.009  PHURINE 6.0  GLUCOSEU 250*  HGBUR SMALL*  BILIRUBINUR NEGATIVE  KETONESUR NEGATIVE  PROTEINUR NEGATIVE  UROBILINOGEN 1.0  NITRITE POSITIVE*  LEUKOCYTESUR LARGE*   Studies/Results: No results found. Medications: I have reviewed the patient's current medications. Scheduled Meds: . atorvastatin  40 mg Oral q1800  . ciprofloxacin  500 mg Oral BID  .  FLUoxetine  40 mg Oral Daily  . folic acid  1 mg Oral Daily  . heparin  5,000 Units Subcutaneous 3 times per day  . multivitamin with minerals  1 tablet Oral Daily  . oxybutynin  5 mg Oral Daily  . sodium chloride  3 mL Intravenous Q12H  . sodium chloride  3 mL Intravenous Q12H  . thiamine  100 mg Oral Daily   Or  . thiamine  100 mg Intravenous Daily   Continuous Infusions: . sodium chloride 125 mL/hr at 12/17/13 0606   PRN Meds:.sodium chloride, albuterol, LORazepam, LORazepam, sodium chloride Assessment/Plan: Active Problems:   DIABETES MELLITUS, TYPE II   HYPERLIPIDEMIA   ANXIETY   ALCOHOL ABUSE   DEPRESSION   Hypo-osmolality and hyponatremia   UTI (lower urinary tract infection)   Hypokalemia   Hyponatremia  Hyponatremia- Patient admitted w/ Na of 124>>137. patient w/ recent h/o N/V. Also w/ decreased po intake  given poor appetite 2/2 nausea. Claims he has not drank alcohol for 2 days. Orthostatic vital signs positive in the ED. Hypoosmololar Hyponatremia likely 2/2 hypovolemia. Urine sodium is low, making hypovolemia even more likely. Patient is prescribed HCTZ for HTN, known to cause hyponatremia, however, the patient has been taking this for quite some time. Oxybutinin not known to cause hyponatremia. SIADH not suspected at this time. Patient w/out cirrhosis or CHF, hypervolemic hyponatremia not suspected given clinical assessment and positive orthostatic changes.  -IVF; NS @ 125 ml/hr  -Hold HCTZ   UTI- Patient w/ UA positive for nitrites and large leukocytes. No dysuria, however, patient w/ h/o urge incontinence, on oxybutinin. Not currently sexually active. No flank pain, no leukocytosis. Given Rocephin 1 g IV in ED.  -cont cipro 500 bid for 5 days -Urine culture pending  -Continue Oxybutinin   Alcohol Abuse w/ h/o withdrawal seizures- Patient typically drinks 6 beers daily, but claims he has not drank for the past 2 days 2/2 his nausea and vomiting. Does not appear anxious or agitated at this time. Patient denies any recent seizure activity.  -CIWA protocol  -MTV + Thiamine   HTN- On HCTZ 25 mg po qd at home. Normotensive and w/ positive orthostatics.  -Hold HCTZ for now in the setting of hyponatremia   HLD- On Zocor at home  -Start statin  Anxiety/Depression- On Prozac at home. No current issues. Denies anxiety or depressive symptoms  -Continue Prozac   DVT/PE PPx- Heparin [place]   Dispo: Disposition is deferred at this time, awaiting improvement of current medical problems.  Anticipated discharge in approximately 1 day(s).   The patient does have a current PCP Olga Millers, MD) and does need an [facility] follow-up appointment after discharge.  The patient does not know have transportation limitations that hinder transportation to clinic appointments.  .Services Needed at time  of discharge: Y = Yes, Blank = No PT:   OT:   RN:   Equipment:   Other:     LOS: 2 days   Clinton Gallant, MD 12/17/2013, 9:02 AM

## 2013-12-17 NOTE — Progress Notes (Signed)
Nsg Discharge Note  Admit Date:  12/15/2013 Discharge date: 12/17/2013   [name] to be D/C'd Home per MD order.  AVS completed.  Copy for chart, and copy for patient signed, and dated. Patient/caregiver able to verbalize understanding.  Discharge Medication:   Medication List    STOP taking these medications       hydrochlorothiazide 25 MG tablet  Commonly known as:  HYDRODIURIL      TAKE these medications       albuterol 108 (90 BASE) MCG/ACT inhaler  Commonly known as:  PROVENTIL HFA;VENTOLIN HFA  Inhale 2 puffs into the lungs every 6 (six) hours as needed for wheezing.     ciprofloxacin 500 MG tablet  Commonly known as:  CIPRO  Take 1 tablet (500 mg total) by mouth 2 (two) times daily.     FLUoxetine 40 MG capsule  Commonly known as:  PROZAC  Take 1 capsule (40 mg total) by mouth daily.     OVER THE COUNTER MEDICATION  Apply 1 application topically daily as needed (for eczema/dry skin).     OVER THE COUNTER MEDICATION  Take 1 tablet by mouth daily as needed (pain). Arthritis pills     oxybutynin 5 MG tablet  Commonly known as:  DITROPAN  Take 5 mg by mouth daily.     simvastatin 20 MG tablet  Commonly known as:  ZOCOR  Take 1 tablet (20 mg total) by mouth at bedtime.        Discharge Assessment: Filed Vitals:   12/17/13 1355  BP: 132/84  Pulse: 101  Temp: 98.1 F (36.7 C)  Resp: 18   Skin clean, dry and intact without evidence of skin break down, no evidence of skin tears noted. IV catheter discontinued intact. Site without signs and symptoms of complications - no redness or edema noted at insertion site, patient denies c/o pain - only slight tenderness at site.  Dressing with slight pressure applied.  D/c Instructions-Education: Discharge instructions given to patient/family with verbalized understanding. D/c education completed with patient/family including follow up instructions, medication list, d/c activities limitations if indicated,  with other d/c instructions as indicated by MD - patient able to verbalize understanding, all questions fully answered. Patient instructed to return to ED, call 911, or call MD for any changes in condition.  Patient escorted via [place], and D/C home via taxi.  Dayle Points, RN 12/17/2013 5:43 PM

## 2013-12-17 NOTE — Discharge Instructions (Signed)
Please limit the amount of water you drink when you go home.

## 2013-12-20 ENCOUNTER — Telehealth: Payer: Self-pay | Admitting: Dietician

## 2013-12-20 ENCOUNTER — Telehealth: Payer: Self-pay | Admitting: *Deleted

## 2013-12-20 NOTE — Telephone Encounter (Signed)
Call from [place], Physical Therapist with [facility] stating she as not been able to get in touch with this patient. Pt has an appointment on Friday 3/27 in AM with Dr Doug Sou.   PT would like a call after the visit on Friday to see if pt still needs PT. Call [phone] ex [phone]

## 2013-12-20 NOTE — Telephone Encounter (Signed)
Calling to assist with transition of care from hospital to home. Discharge date:12-17-13 Call date: 12-21-13 Hospital follow up appointment date: 12-23-13 at 10:45 AM with Dr. Doug Sou  Left message with appointment day and time and call back number for questions.

## 2013-12-22 NOTE — Discharge Summary (Signed)
INTERNAL MEDICINE ATTENDING DISCHARGE COSIGN   I evaluated the patient on the day of discharge and discussed the discharge plan with my resident team. I agree with the discharge documentation and disposition.   Madilyn Fireman 12/22/2013, 3:08 PM

## 2013-12-23 ENCOUNTER — Encounter: Payer: Self-pay | Admitting: Licensed Clinical Social Worker

## 2013-12-23 ENCOUNTER — Ambulatory Visit: Payer: Medicare Other | Admitting: Internal Medicine

## 2013-12-28 ENCOUNTER — Telehealth: Payer: Self-pay | Admitting: *Deleted

## 2013-12-28 NOTE — Telephone Encounter (Signed)
PT, would like to include nursing services and social work, the nursing services for medication assistance, pt had only 2 of meds in the home today and stated his daughter was going to get the remainder sometime. The social work visit is to assist with finances and transportation. She was given a verbal order of approval, do you agree? Written orders will be sent by advanced for approval

## 2013-12-29 NOTE — Telephone Encounter (Signed)
Yeah that's fine, but I haven't seen him in some time. He has no showed several times. When transport is set up please have him seen in clinic.  Dr. Doug Sou

## 2013-12-30 DIAGNOSIS — E119 Type 2 diabetes mellitus without complications: Secondary | ICD-10-CM | POA: Diagnosis not present

## 2014-01-06 ENCOUNTER — Other Ambulatory Visit: Payer: Self-pay | Admitting: Dietician

## 2014-01-06 ENCOUNTER — Ambulatory Visit (HOSPITAL_COMMUNITY)
Admission: RE | Admit: 2014-01-06 | Discharge: 2014-01-06 | Disposition: A | Discharge status: Home / Self Care | Payer: Medicare Other | Source: Ambulatory Visit | Attending: Internal Medicine | Admitting: Internal Medicine

## 2014-01-06 ENCOUNTER — Ambulatory Visit (INDEPENDENT_AMBULATORY_CARE_PROVIDER_SITE_OTHER): Payer: Medicare Other | Admitting: Internal Medicine

## 2014-01-06 ENCOUNTER — Encounter: Payer: Self-pay | Admitting: Internal Medicine

## 2014-01-06 VITALS — BP 135/84 | HR 90 | Temp 97.9°F | Ht 68.0 in | Wt 180.3 lb

## 2014-01-06 DIAGNOSIS — M25569 Pain in unspecified knee: Secondary | ICD-10-CM | POA: Diagnosis not present

## 2014-01-06 DIAGNOSIS — E871 Hypo-osmolality and hyponatremia: Secondary | ICD-10-CM | POA: Diagnosis not present

## 2014-01-06 DIAGNOSIS — M1710 Unilateral primary osteoarthritis, unspecified knee: Secondary | ICD-10-CM | POA: Diagnosis not present

## 2014-01-06 DIAGNOSIS — R0602 Shortness of breath: Secondary | ICD-10-CM

## 2014-01-06 DIAGNOSIS — M169 Osteoarthritis of hip, unspecified: Secondary | ICD-10-CM | POA: Diagnosis not present

## 2014-01-06 DIAGNOSIS — M79609 Pain in unspecified limb: Secondary | ICD-10-CM

## 2014-01-06 DIAGNOSIS — E119 Type 2 diabetes mellitus without complications: Secondary | ICD-10-CM

## 2014-01-06 DIAGNOSIS — I10 Essential (primary) hypertension: Secondary | ICD-10-CM | POA: Diagnosis not present

## 2014-01-06 DIAGNOSIS — F329 Major depressive disorder, single episode, unspecified: Secondary | ICD-10-CM

## 2014-01-06 DIAGNOSIS — M1610 Unilateral primary osteoarthritis, unspecified hip: Secondary | ICD-10-CM | POA: Insufficient documentation

## 2014-01-06 DIAGNOSIS — E876 Hypokalemia: Secondary | ICD-10-CM

## 2014-01-06 DIAGNOSIS — M25559 Pain in unspecified hip: Secondary | ICD-10-CM | POA: Insufficient documentation

## 2014-01-06 DIAGNOSIS — E785 Hyperlipidemia, unspecified: Secondary | ICD-10-CM

## 2014-01-06 DIAGNOSIS — M79606 Pain in leg, unspecified: Secondary | ICD-10-CM | POA: Insufficient documentation

## 2014-01-06 DIAGNOSIS — F32A Depression, unspecified: Secondary | ICD-10-CM

## 2014-01-06 DIAGNOSIS — IMO0002 Reserved for concepts with insufficient information to code with codable children: Secondary | ICD-10-CM | POA: Diagnosis not present

## 2014-01-06 DIAGNOSIS — F1010 Alcohol abuse, uncomplicated: Secondary | ICD-10-CM

## 2014-01-06 LAB — BASIC METABOLIC PANEL WITH GFR
BUN: 14 mg/dL (ref 6–23)
CO2: 15 meq/L — AB (ref 19–32)
CREATININE: 1.46 mg/dL — AB (ref 0.50–1.35)
Calcium: 9.4 mg/dL (ref 8.4–10.5)
Chloride: 99 mEq/L (ref 96–112)
GFR, Est African American: 56 mL/min — ABNORMAL LOW
GFR, Est Non African American: 48 mL/min — ABNORMAL LOW
GLUCOSE: 186 mg/dL — AB (ref 70–99)
Potassium: 5.2 mEq/L (ref 3.5–5.3)
Sodium: 135 mEq/L (ref 135–145)

## 2014-01-06 LAB — POCT GLYCOSYLATED HEMOGLOBIN (HGB A1C): Hemoglobin A1C: 6.4

## 2014-01-06 LAB — HM DIABETES EYE EXAM

## 2014-01-06 MED ORDER — FLUOXETINE HCL 40 MG PO CAPS: 40.0000 mg | ORAL_CAPSULE | Freq: Every day | ORAL | Status: DC

## 2014-01-06 MED ORDER — ALBUTEROL SULFATE HFA 108 (90 BASE) MCG/ACT IN AERS: 2.0000 | INHALATION_SPRAY | Freq: Four times a day (QID) | RESPIRATORY_TRACT | Status: DC | PRN

## 2014-01-06 MED ORDER — SIMVASTATIN 20 MG PO TABS: 20.0000 mg | ORAL_TABLET | Freq: Every day | ORAL | Status: DC

## 2014-01-06 NOTE — Assessment & Plan Note (Signed)
Patients knee and hip pain is likely due to OA. I rec xray of hip and knee and will modify treatment as indicated. I do not rec narcotics as patient has substance use d/o with alcohol. I rec tylenol tid prn for joint pains.

## 2014-01-06 NOTE — Assessment & Plan Note (Addendum)
Patients A1C is less than 7, diet controlled. Plan for retina scan and referral as indicated.

## 2014-01-06 NOTE — Progress Notes (Signed)
Patient ID: William [name], male   DOB: [birth year], 68 y.o.   MRN: [id number]    Subjective:   Patient ID: William [name] male   DOB: [birth year] 68 y.o.   MRN: [id number]  HPI: [name] a 68 y.o. man with pmhx below who comes to clinic today for hospital follow up. He complains of chronic right hip and knee pain. He states that it was been worse over the last two weeks. He sates that his dysuria and generalized weakness have resolved. No complaints of HA, CP, SOB, abdominal pain. No nausea or vomiting. No known fall or recent injury. However, patient has history of alcohol abuse and may not always remember if he were to fall.    Past Medical History  Diagnosis Date  . Diabetes mellitus type II   . Hyperlipemia   . Anxiety   . Alcohol abuse   . Hearing impairment   . HTN (hypertension)   . Cataract   . Alcohol withdrawal seizure    Current Outpatient Prescriptions  Medication Sig Dispense Refill  . albuterol (PROVENTIL HFA;VENTOLIN HFA) 108 (90 BASE) MCG/ACT inhaler Inhale 2 puffs into the lungs every 6 (six) hours as needed for wheezing.  1 Inhaler  2  . FLUoxetine (PROZAC) 40 MG capsule Take 1 capsule (40 mg total) by mouth daily.  90 capsule  3  . oxybutynin (DITROPAN) 5 MG tablet Take 5 mg by mouth daily.      . simvastatin (ZOCOR) 20 MG tablet Take 1 tablet (20 mg total) by mouth at bedtime.  90 tablet  3  . ciprofloxacin (CIPRO) 500 MG tablet Take 1 tablet (500 mg total) by mouth 2 (two) times daily.  10 tablet  0  . OVER THE COUNTER MEDICATION Apply 1 application topically daily as needed (for eczema/dry skin).      Marland Kitchen OVER THE COUNTER MEDICATION Take 1 tablet by mouth daily as needed (pain). Arthritis pills       No current facility-administered medications for this visit.   No family history on file. History   Social History  . Marital Status: Single    Spouse Name: N/A    Number of Children: N/A  . Years of Education: N/A   Social History Main  Topics  . Smoking status: Current Every Day Smoker    Types: Cigars  . Smokeless tobacco: Current User    Types: Chew  . Alcohol Use: Yes     Comment: vodka--1/2 gal, heavy drinker x8 mos   . Drug Use: No  . Sexual Activity: No   Other Topics Concern  . Not on file   Social History Narrative  . No narrative on file   Review of Systems: Pertinent items are noted in HPI. Objective:  Physical Exam: Filed Vitals:   01/06/14 1559  BP: 135/84  Pulse: 90  Temp: 97.9 F (36.6 C)  TempSrc: Oral  Height: 5\' 8"  (1.727 m)  Weight: 180 lb 4.8 oz (81.784 kg)  SpO2: 99%   Physical Exam  Constitutional: He is oriented to person, place, and time. He appears well-developed and well-nourished. No distress.  Elderly man  HENT:  Head: Normocephalic and atraumatic.  Mouth/Throat: Oropharynx is clear and moist. No oropharyngeal exudate.  Cardiovascular: Normal rate, regular rhythm and normal heart sounds.  Exam reveals no friction rub.   No murmur heard. Pulmonary/Chest: Effort normal and breath sounds normal. No respiratory distress. He has no wheezes. He has no rales.  Abdominal: Soft.  Bowel sounds are normal. He exhibits no distension. There is no tenderness.  Musculoskeletal: He exhibits tenderness. He exhibits no edema.       Right hip: He exhibits decreased range of motion, tenderness and bony tenderness. He exhibits no swelling, no crepitus and no deformity.       Legs: Movement in right hip limited 2/2 pain with passive ROM. R Knee tender to palpation above the patella. ROM limited in right knee 2/2 pain with passive ROM.  Hip flexion leg extension, and leg flexion 5/5. Intact to light touch bilaterally.  No warmth erythema or drainage.  Neurological: He is oriented to person, place, and time.  Skin: He is not diaphoretic.  Psychiatric: He has a normal mood and affect. His behavior is normal.    Assessment & Plan:

## 2014-01-06 NOTE — Assessment & Plan Note (Signed)
Patient appears to continue to abuse alcohol. Recommended the patient to avoid alcohol. He appears to be in the precomtemplative stage at this time.

## 2014-01-06 NOTE — Assessment & Plan Note (Signed)
Checking BMP today.

## 2014-01-06 NOTE — Patient Instructions (Signed)
Continue to take good care of yourself.  Please consider alcohol cessation.  I am checking labs today. I will call you with results if they are abnormal.  Please take tylenol 500 mg four times a day as needed for your hip and knee pain. I will call you with the results of the xrays. Please use ice and heating pads as needed.

## 2014-01-06 NOTE — Assessment & Plan Note (Signed)
Patients BP is well controlled off of HCTZ. As he recently had electrolyte disturbance while on HCTZ, I plan to remove this medication from his med list. Plan to continue to follow at future visits.

## 2014-01-09 ENCOUNTER — Other Ambulatory Visit: Payer: Self-pay | Admitting: Internal Medicine

## 2014-01-09 ENCOUNTER — Telehealth: Payer: Self-pay | Admitting: Internal Medicine

## 2014-01-09 DIAGNOSIS — E871 Hypo-osmolality and hyponatremia: Secondary | ICD-10-CM

## 2014-01-09 DIAGNOSIS — E119 Type 2 diabetes mellitus without complications: Secondary | ICD-10-CM

## 2014-01-09 DIAGNOSIS — N179 Acute kidney failure, unspecified: Secondary | ICD-10-CM

## 2014-01-09 LAB — GLUCOSE, CAPILLARY: GLUCOSE-CAPILLARY: 171 mg/dL — AB (ref 70–99)

## 2014-01-09 NOTE — Telephone Encounter (Signed)
Attempted to contact patient this AM to inform him that he needs to come tom clinic today or tomorrow for repeat blood work due to possible AKI. No answer on phone number provided. Will try again alter this afternoon.

## 2014-01-10 NOTE — Progress Notes (Signed)
Case discussed with Dr. Komanski at time of visit.  We reviewed the resident's history and exam and pertinent patient test results.  I agree with the assessment, diagnosis, and plan of care documented in the resident's note. 

## 2014-01-11 ENCOUNTER — Other Ambulatory Visit (INDEPENDENT_AMBULATORY_CARE_PROVIDER_SITE_OTHER): Payer: Medicare Other

## 2014-01-11 DIAGNOSIS — E871 Hypo-osmolality and hyponatremia: Secondary | ICD-10-CM

## 2014-01-11 DIAGNOSIS — N179 Acute kidney failure, unspecified: Secondary | ICD-10-CM

## 2014-01-11 DIAGNOSIS — E119 Type 2 diabetes mellitus without complications: Secondary | ICD-10-CM | POA: Diagnosis not present

## 2014-01-11 LAB — COMPLETE METABOLIC PANEL WITH GFR
ALK PHOS: 69 U/L (ref 39–117)
ALT: 9 U/L (ref 0–53)
AST: 12 U/L (ref 0–37)
Albumin: 3.7 g/dL (ref 3.5–5.2)
BILIRUBIN TOTAL: 0.6 mg/dL (ref 0.2–1.2)
BUN: 12 mg/dL (ref 6–23)
CO2: 24 mEq/L (ref 19–32)
Calcium: 9 mg/dL (ref 8.4–10.5)
Chloride: 103 mEq/L (ref 96–112)
Creat: 1.08 mg/dL (ref 0.50–1.35)
GFR, EST NON AFRICAN AMERICAN: 69 mL/min
GFR, Est African American: 80 mL/min
GLUCOSE: 137 mg/dL — AB (ref 70–99)
Potassium: 4.7 mEq/L (ref 3.5–5.3)
Sodium: 133 mEq/L — ABNORMAL LOW (ref 135–145)
Total Protein: 6.5 g/dL (ref 6.0–8.3)

## 2014-01-18 ENCOUNTER — Other Ambulatory Visit: Payer: Self-pay | Admitting: *Deleted

## 2014-01-18 MED ORDER — OXYBUTYNIN CHLORIDE 5 MG PO TABS: 5.0000 mg | ORAL_TABLET | Freq: Every day | ORAL | Status: DC

## 2014-02-25 ENCOUNTER — Emergency Department (HOSPITAL_COMMUNITY): Payer: Medicare Other

## 2014-02-25 ENCOUNTER — Inpatient Hospital Stay (HOSPITAL_COMMUNITY)
Admission: EM | Admit: 2014-02-25 | Discharge: 2014-03-01 | DRG: 896 | Disposition: A | Discharge status: Home / Self Care | Payer: Medicare Other | Attending: Oncology | Admitting: Oncology

## 2014-02-25 ENCOUNTER — Encounter (HOSPITAL_COMMUNITY): Payer: Self-pay | Admitting: Emergency Medicine

## 2014-02-25 DIAGNOSIS — F17200 Nicotine dependence, unspecified, uncomplicated: Secondary | ICD-10-CM | POA: Diagnosis present

## 2014-02-25 DIAGNOSIS — F10939 Alcohol use, unspecified with withdrawal, unspecified: Principal | ICD-10-CM | POA: Diagnosis present

## 2014-02-25 DIAGNOSIS — N183 Chronic kidney disease, stage 3 unspecified: Secondary | ICD-10-CM | POA: Diagnosis present

## 2014-02-25 DIAGNOSIS — E119 Type 2 diabetes mellitus without complications: Secondary | ICD-10-CM | POA: Diagnosis present

## 2014-02-25 DIAGNOSIS — H269 Unspecified cataract: Secondary | ICD-10-CM | POA: Diagnosis present

## 2014-02-25 DIAGNOSIS — E860 Dehydration: Secondary | ICD-10-CM | POA: Diagnosis present

## 2014-02-25 DIAGNOSIS — N179 Acute kidney failure, unspecified: Secondary | ICD-10-CM | POA: Diagnosis present

## 2014-02-25 DIAGNOSIS — I10 Essential (primary) hypertension: Secondary | ICD-10-CM | POA: Diagnosis present

## 2014-02-25 DIAGNOSIS — F1010 Alcohol abuse, uncomplicated: Secondary | ICD-10-CM

## 2014-02-25 DIAGNOSIS — F10239 Alcohol dependence with withdrawal, unspecified: Principal | ICD-10-CM | POA: Diagnosis present

## 2014-02-25 DIAGNOSIS — E1149 Type 2 diabetes mellitus with other diabetic neurological complication: Secondary | ICD-10-CM | POA: Diagnosis present

## 2014-02-25 DIAGNOSIS — N184 Chronic kidney disease, stage 4 (severe): Secondary | ICD-10-CM | POA: Diagnosis present

## 2014-02-25 DIAGNOSIS — N189 Chronic kidney disease, unspecified: Secondary | ICD-10-CM | POA: Diagnosis present

## 2014-02-25 DIAGNOSIS — H9190 Unspecified hearing loss, unspecified ear: Secondary | ICD-10-CM | POA: Diagnosis present

## 2014-02-25 DIAGNOSIS — G40401 Other generalized epilepsy and epileptic syndromes, not intractable, with status epilepticus: Secondary | ICD-10-CM | POA: Diagnosis present

## 2014-02-25 DIAGNOSIS — Z9119 Patient's noncompliance with other medical treatment and regimen: Secondary | ICD-10-CM

## 2014-02-25 DIAGNOSIS — F1020 Alcohol dependence, uncomplicated: Secondary | ICD-10-CM | POA: Diagnosis present

## 2014-02-25 DIAGNOSIS — R569 Unspecified convulsions: Secondary | ICD-10-CM | POA: Diagnosis present

## 2014-02-25 DIAGNOSIS — I129 Hypertensive chronic kidney disease with stage 1 through stage 4 chronic kidney disease, or unspecified chronic kidney disease: Secondary | ICD-10-CM | POA: Diagnosis present

## 2014-02-25 DIAGNOSIS — Z91199 Patient's noncompliance with other medical treatment and regimen due to unspecified reason: Secondary | ICD-10-CM

## 2014-02-25 HISTORY — DX: Essential (primary) hypertension: I10

## 2014-02-25 HISTORY — DX: Type 2 diabetes mellitus with other diabetic neurological complication: E11.49

## 2014-02-25 HISTORY — DX: Chronic kidney disease, stage 3 (moderate): N18.3

## 2014-02-25 LAB — CBC WITH DIFFERENTIAL/PLATELET
BASOS ABS: 0 10*3/uL (ref 0.0–0.1)
Basophils Relative: 0 % (ref 0–1)
EOS PCT: 0 % (ref 0–5)
Eosinophils Absolute: 0 10*3/uL (ref 0.0–0.7)
HCT: 37.6 % — ABNORMAL LOW (ref 39.0–52.0)
Hemoglobin: 12.7 g/dL — ABNORMAL LOW (ref 13.0–17.0)
Lymphocytes Relative: 5 % — ABNORMAL LOW (ref 12–46)
Lymphs Abs: 0.5 10*3/uL — ABNORMAL LOW (ref 0.7–4.0)
MCH: 32 pg (ref 26.0–34.0)
MCHC: 33.8 g/dL (ref 30.0–36.0)
MCV: 94.7 fL (ref 78.0–100.0)
Monocytes Absolute: 0.4 10*3/uL (ref 0.1–1.0)
Monocytes Relative: 3 % (ref 3–12)
Neutro Abs: 11 10*3/uL — ABNORMAL HIGH (ref 1.7–7.7)
Neutrophils Relative %: 92 % — ABNORMAL HIGH (ref 43–77)
Platelets: 208 10*3/uL (ref 150–400)
RBC: 3.97 MIL/uL — ABNORMAL LOW (ref 4.22–5.81)
RDW: 13.7 % (ref 11.5–15.5)
WBC: 12 10*3/uL — ABNORMAL HIGH (ref 4.0–10.5)

## 2014-02-25 LAB — COMPREHENSIVE METABOLIC PANEL
ALT: 10 U/L (ref 0–53)
AST: 16 U/L (ref 0–37)
Albumin: 4.4 g/dL (ref 3.5–5.2)
Alkaline Phosphatase: 87 U/L (ref 39–117)
BUN: 12 mg/dL (ref 6–23)
CALCIUM: 9.4 mg/dL (ref 8.4–10.5)
CO2: 14 mEq/L — ABNORMAL LOW (ref 19–32)
CREATININE: 2.22 mg/dL — AB (ref 0.50–1.35)
Chloride: 99 mEq/L (ref 96–112)
GFR calc Af Amer: 33 mL/min — ABNORMAL LOW (ref >90)
GFR, EST NON AFRICAN AMERICAN: 28 mL/min — AB (ref >90)
Glucose, Bld: 277 mg/dL — ABNORMAL HIGH (ref 70–99)
Potassium: 4.9 mEq/L (ref 3.7–5.3)
Sodium: 134 mEq/L — ABNORMAL LOW (ref 137–147)
Total Bilirubin: 0.4 mg/dL (ref 0.3–1.2)
Total Protein: 7.8 g/dL (ref 6.0–8.3)

## 2014-02-25 LAB — URINALYSIS, ROUTINE W REFLEX MICROSCOPIC
Bilirubin Urine: NEGATIVE
Glucose, UA: 100 mg/dL — AB
Hgb urine dipstick: NEGATIVE
Ketones, ur: NEGATIVE mg/dL
LEUKOCYTES UA: NEGATIVE
NITRITE: NEGATIVE
Protein, ur: NEGATIVE mg/dL
SPECIFIC GRAVITY, URINE: 1.013 (ref 1.005–1.030)
UROBILINOGEN UA: 0.2 mg/dL (ref 0.0–1.0)
pH: 5 (ref 5.0–8.0)

## 2014-02-25 LAB — RAPID URINE DRUG SCREEN, HOSP PERFORMED
Amphetamines: NOT DETECTED
Barbiturates: NOT DETECTED
Benzodiazepines: POSITIVE — AB
Cocaine: NOT DETECTED
OPIATES: NOT DETECTED
Tetrahydrocannabinol: NOT DETECTED

## 2014-02-25 LAB — ETHANOL

## 2014-02-25 LAB — I-STAT CHEM 8, ED
BUN: 12 mg/dL (ref 6–23)
Calcium, Ion: 1.18 mmol/L (ref 1.13–1.30)
Chloride: 105 mEq/L (ref 96–112)
Creatinine, Ser: 2.7 mg/dL — ABNORMAL HIGH (ref 0.50–1.35)
Glucose, Bld: 280 mg/dL — ABNORMAL HIGH (ref 70–99)
HEMATOCRIT: 41 % (ref 39.0–52.0)
HEMOGLOBIN: 13.9 g/dL (ref 13.0–17.0)
Potassium: 4.7 mEq/L (ref 3.7–5.3)
SODIUM: 137 meq/L (ref 137–147)
TCO2: 16 mmol/L (ref 0–100)

## 2014-02-25 LAB — I-STAT CG4 LACTIC ACID, ED: Lactic Acid, Venous: 1.17 mmol/L (ref 0.5–2.2)

## 2014-02-25 LAB — CBG MONITORING, ED: GLUCOSE-CAPILLARY: 273 mg/dL — AB (ref 70–99)

## 2014-02-25 MED ORDER — SODIUM CHLORIDE 0.9 % IV BOLUS (SEPSIS)
1000.0000 mL | Freq: Once | INTRAVENOUS | Status: AC
  Administered 2014-02-25: 1000 mL via INTRAVENOUS

## 2014-02-25 MED ORDER — LORAZEPAM 2 MG/ML IJ SOLN
2.0000 mg | Freq: Once | INTRAMUSCULAR | Status: AC
  Administered 2014-02-25: 2 mg via INTRAVENOUS
  Filled 2014-02-25: qty 1

## 2014-02-25 MED ORDER — SODIUM CHLORIDE 0.9 % IV SOLN
1000.0000 mg | Freq: Once | INTRAVENOUS | Status: AC
  Administered 2014-02-25: 1000 mg via INTRAVENOUS
  Filled 2014-02-25: qty 10

## 2014-02-25 MED ORDER — LORAZEPAM 2 MG/ML IJ SOLN
1.0000 mg | Freq: Once | INTRAMUSCULAR | Status: AC
  Administered 2014-02-25: 1 mg via INTRAVENOUS
  Filled 2014-02-25: qty 1

## 2014-02-25 NOTE — ED Notes (Signed)
Pt observed to be moaning and more vocal at this time.

## 2014-02-25 NOTE — ED Notes (Signed)
Patient transported to CT 

## 2014-02-25 NOTE — ED Notes (Signed)
Pt's muscle tone less rigid after administration of ativan.

## 2014-02-25 NOTE — ED Notes (Signed)
EMS called for transport.

## 2014-02-25 NOTE — ED Provider Notes (Signed)
CSN: [id number]     Arrival date & time 02/25/14  1946 History   First MD Initiated Contact with Patient 02/25/14 1948     Chief Complaint  Patient presents with  . Seizures     (Consider location/radiation/quality/duration/timing/severity/associated sxs/prior Treatment) HPI 68 year old male presents by EMS after having a seizure. History is limited as the patient is currently unresponsive. Per EMS the patient had prolonged seizure, was given 5 mg Versed IM and then 2.5 mg IV. Patient stopped seizing but is continuing to have his eyes drift to the left. No more shaking noticed. The family was not helpful for history per EMS, and no family has arrived in the ER. They think he has had seizures before but is hard to tell. The patient a chronic alcoholic that he missed his not know when the last time he had any alcohol. I was able to get in contact with his significant other but she states that her daughter, Ruffin Frederick, takes care of the patient and was the one that called EMS. I called her but no one answered her phone (number [phone])  Past Medical History  Diagnosis Date  . Diabetes mellitus without complication   . Hypertension   . Hearing impairment   . Cataract    History reviewed. No pertinent past surgical history. No family history on file. History  Substance Use Topics  . Smoking status: Current Every Day Smoker    Types: Cigars  . Smokeless tobacco: Not on file  . Alcohol Use: Yes    Review of Systems  Unable to perform ROS: Patient unresponsive      Allergies  Review of patient's allergies indicates no known allergies.  Home Medications   Prior to Admission medications   Not on File   BP 168/78  Pulse 150  Temp(Src) 99.7 F (37.6 C) (Rectal)  Resp 25  SpO2 100% Physical Exam  Nursing note and vitals reviewed. Constitutional:  disheveled  HENT:  Head: Normocephalic and atraumatic.  Right Ear: External ear normal.  Left Ear: External ear normal.   Nose: Nose normal.  Eyes: Pupils are equal, round, and reactive to light. Right eye exhibits no discharge. Left eye exhibits no discharge.  Eyes deviated to the left. Occasionally return to midline and move around, then return to deviating to the left. Does not localize to voice  Neck: Neck supple.  Cardiovascular: Regular rhythm, normal heart sounds and intact distal pulses.  Tachycardia present.   Pulmonary/Chest: Effort normal and breath sounds normal.  Abdominal: Soft. He exhibits no distension.  Musculoskeletal: He exhibits no edema.  Neurological: He is unresponsive.  Skin: Skin is warm and dry.    ED Course  Procedures (including critical care time) Labs Review Labs Reviewed  CBC WITH DIFFERENTIAL - Abnormal; Notable for the following:    WBC 12.0 (*)    RBC 3.97 (*)    Hemoglobin 12.7 (*)    HCT 37.6 (*)    Neutrophils Relative % 92 (*)    Neutro Abs 11.0 (*)    Lymphocytes Relative 5 (*)    Lymphs Abs 0.5 (*)    All other components within normal limits  COMPREHENSIVE METABOLIC PANEL - Abnormal; Notable for the following:    Sodium 134 (*)    CO2 14 (*)    Glucose, Bld 277 (*)    Creatinine, Ser 2.22 (*)    GFR calc non Af Amer 28 (*)    GFR calc Af Amer 33 (*)  All other components within normal limits  CBG MONITORING, ED - Abnormal; Notable for the following:    Glucose-Capillary 273 (*)    All other components within normal limits  I-STAT CHEM 8, ED - Abnormal; Notable for the following:    Creatinine, Ser 2.70 (*)    Glucose, Bld 280 (*)    All other components within normal limits  ETHANOL  URINALYSIS, ROUTINE W REFLEX MICROSCOPIC  URINE RAPID DRUG SCREEN (HOSP PERFORMED)  I-STAT CG4 LACTIC ACID, ED    Imaging Review Ct Head Wo Contrast  02/25/2014   CLINICAL DATA:  All seizure  EXAM: CT HEAD WITHOUT CONTRAST  TECHNIQUE: Contiguous axial images were obtained from the base of the skull through the vertex without intravenous contrast.  COMPARISON:   None.  FINDINGS: No acute intracranial hemorrhage. No focal mass lesion. No CT evidence of acute infarction. No midline shift or mass effect. No hydrocephalus. Basilar cisterns are patent. .  There is extensive cortical atrophy. There is mild periventricular white matter hypodensities.  Small amount of fluid in the left mastoid air cells. There is opacification of the right maxillary sinus. Frontal sinuses are clear.  IMPRESSION: 1. No acute intracranial findings. 2. Significant cortical atrophy. 3. Mild microvascular disease. 4. Fluid in the left mastoid air cells. 5. Opacification of the right maxillary sinus suggests chronic sinusitis.   Electronically Signed   By: Suzy Bouchard M.D.   On: 02/25/2014 20:38     EKG Interpretation None      CRITICAL CARE Performed by: Ephraim Hamburger   Total critical care time: 30 minutes  Critical care time was exclusive of separately billable procedures and treating other patients.  Critical care was necessary to treat or prevent imminent or life-threatening deterioration.  Critical care was time spent personally by me on the following activities: development of treatment plan with patient and/or surrogate as well as nursing, discussions with consultants, evaluation of patient's response to treatment, examination of patient, obtaining history from patient or surrogate, ordering and performing treatments and interventions, ordering and review of laboratory studies, ordering and review of radiographic studies, pulse oximetry and re-evaluation of patient's condition.  MDM   Final diagnoses:  Seizures  ARF (acute renal failure)    There is concern the patient is initially still seizing. He was given Ativan and after returning from CT scan his tachycardia improved he was no longer deviating his eyes to the left. He's still not very responsive so it is tough to tell if this is postictal or continued seizures. I discussed the case with Dr. Leonel Ramsay of  neurology, who recommends admission to [facility] for stat EEG. Will load with IV Keppra at his request. I discussed with the internal medicine teaching service who will admit the patient to step down. I updated his family, including his daughter who is able to get in contact with. She was unable to ride much more significant formation except that he last drank 2 beers yesterday. At this time he is having snoring but is maintaining normal oxygen and taking consistent breaths. I do not feel he needs intubation at this time. Nasal airway was placed by me to help keep airway open. Given his prior history of alcohol withdrawal seizure, no ETOH today and seizures, I feel ETOH withdrawal is most likely cause. No fevers or prior AMS to suggest meningitis, I do not think he needs an LP.    Ephraim Hamburger, MD 02/25/14 [phone]

## 2014-02-25 NOTE — Consult Note (Signed)
Neurology Consultation Reason for Consult: Seizure Referring Physician: Tyron Russell  CC: SEizure  History is obtained from: Referring MD< medical record, daughter.   HPI: William [name] is a 68 y.o. male who apparently had a prolonged seizure and was given versed 5mg  IM  Then 2.5 mg IV with cessation of movements, but after arrival was noted to have continued eye deviation. He was given an additional 1mg  IV ativan with cessation of eye deviation and has had some improvement per nursing, more voluntary movements, etc, but continues to be densely altered.   He lives with his daughter who states that she last saw him prior to going to bed last night 5/29. This evening, she had not seen them and when to check on him and found him on the floor between his bed and the wall seizing repeatedly hitting his right hand against the floor. It is unclear how long he had been this way. She states that it appears that he has not gotten dressed that morning, though I am not certain this is what she was trying to relay given that she is a very poor historian.  He typically drinks 2 22 ounce ice house beers per day. He drank is normal amount on the 29th.  He has a history of a single previous seizure.   ROS: A 14 point ROS was unable to be performed due to AMS   Past Medical History  Diagnosis Date  . Diabetes mellitus without complication   . Hypertension   . Hearing impairment   . Cataract     Family History: Unable to assess secondary to patient's altered mental status.    Social History: Tob: Unable to assess secondary to patient's altered mental status.    Exam: Current vital signs: BP 151/78  Pulse 122  Temp(Src) 99.7 F (37.6 C) (Rectal)  Resp 16  SpO2 96% Vital signs in last 24 hours: Temp:  [99.7 F (37.6 C)] 99.7 F (37.6 C) (05/30 2002) Pulse Rate:  [122-150] 122 (05/30 2100) Resp:  [16-25] 16 (05/30 2100) BP: (151-168)/(78-83) 151/78 mmHg (05/30 2100) SpO2:  [96  %-100 %] 96 % (05/30 2100)  General: in bed, Nasal trumpet in place.  CV: tachycardic Mental Status: Patient is obtunded. He does not open eyes or follow commands.  Cranial Nerves: II: Does not blink to threat. Pupils are equal, round, and reactive to light.  Discs are difficult to visualize III,IV, VI: Eyes are roving, does not fixate or track, he does cross midline both directions V: VII: Blinks to eyelid stimulation VIII, X, XI, XII: Unable to assess secondary to patient's altered mental status.  Motor: Tone is increased in his right arm. He has semipurposeful movements of his left arm. He withdrawals his his right leg to noxious stimuli, but withdraws his left leg much more briskly. Minimal withdrawal right upper extremity Sensory: As above Deep Tendon Reflexes: 2+ and symmetric  patellae. Unable to obtain in right upper extremity due to tensed arm Cerebellar: Unable to assess secondary to patient's altered mental status.  Gait: Unable to assess secondary to patient's altered mental status.     I have reviewed labs in epic and the results pertinent to this consultation are: Elevated creatinine.   I have reviewed the images obtained:CT head - atrophy  Impression: 68 yo M with seizure of unknown duration, but possibly very prolonged with cessation of movements, but persistent altered mental status. It is possible with a severely prolonged seizure, that this could represent post-ictal  state vs. Injury from prolonged seizure, but will need to rule out ongoing status epilepticus. In the interim will give an additional dose of benzodiazepine. Without change in drinking habits, not clearly etoh withdrawal seizure, though possibly he had not been drinking as his daughter thinks as he has been feeling ill for a couple of days.   Recommendations: 1) Additional ativan 2mg  IV x 1 2) will get stat EEG on arrival to [facility] 3) Further AED dependant on EEG   Roland Rack, MD [facility] [phone]  If 7pm- 7am, please page neurology on call as listed in [place].

## 2014-02-25 NOTE — ED Notes (Signed)
Bed: [id number] Expected date: 02/25/14 Expected time: 7:20 PM Means of arrival: Ambulance Comments: seizures

## 2014-02-25 NOTE — ED Notes (Signed)
Per EMS report: pt from home: Pt had witness seizure.  On EMS arrival, pt was actively having a seizure.  EMS gave pt 5mg  of versad IM and then 2.5mg  IV. Pt currently on a non rebreather.

## 2014-02-25 NOTE — ED Notes (Addendum)
Pt appears to be more responsive after the CT scan. Dr. Regenia Skeeter placed a 6.0 nasal trumpet in pt's right nostril.

## 2014-02-26 ENCOUNTER — Encounter (HOSPITAL_COMMUNITY): Payer: Self-pay | Admitting: Oncology

## 2014-02-26 ENCOUNTER — Other Ambulatory Visit: Payer: Self-pay | Admitting: Internal Medicine

## 2014-02-26 ENCOUNTER — Inpatient Hospital Stay (HOSPITAL_COMMUNITY): Payer: Medicare Other

## 2014-02-26 DIAGNOSIS — I10 Essential (primary) hypertension: Secondary | ICD-10-CM

## 2014-02-26 DIAGNOSIS — I129 Hypertensive chronic kidney disease with stage 1 through stage 4 chronic kidney disease, or unspecified chronic kidney disease: Secondary | ICD-10-CM | POA: Diagnosis not present

## 2014-02-26 DIAGNOSIS — E119 Type 2 diabetes mellitus without complications: Secondary | ICD-10-CM | POA: Diagnosis not present

## 2014-02-26 DIAGNOSIS — G40309 Generalized idiopathic epilepsy and epileptic syndromes, not intractable, without status epilepticus: Secondary | ICD-10-CM | POA: Diagnosis not present

## 2014-02-26 DIAGNOSIS — N179 Acute kidney failure, unspecified: Secondary | ICD-10-CM | POA: Diagnosis present

## 2014-02-26 DIAGNOSIS — N189 Chronic kidney disease, unspecified: Secondary | ICD-10-CM

## 2014-02-26 DIAGNOSIS — N184 Chronic kidney disease, stage 4 (severe): Secondary | ICD-10-CM | POA: Diagnosis present

## 2014-02-26 DIAGNOSIS — R569 Unspecified convulsions: Secondary | ICD-10-CM | POA: Diagnosis not present

## 2014-02-26 DIAGNOSIS — F1010 Alcohol abuse, uncomplicated: Secondary | ICD-10-CM

## 2014-02-26 DIAGNOSIS — N183 Chronic kidney disease, stage 3 unspecified: Secondary | ICD-10-CM

## 2014-02-26 DIAGNOSIS — E1149 Type 2 diabetes mellitus with other diabetic neurological complication: Secondary | ICD-10-CM

## 2014-02-26 HISTORY — DX: Chronic kidney disease, stage 3 unspecified: N18.30

## 2014-02-26 HISTORY — DX: Type 2 diabetes mellitus with other diabetic neurological complication: E11.49

## 2014-02-26 HISTORY — DX: Essential (primary) hypertension: I10

## 2014-02-26 LAB — BASIC METABOLIC PANEL
BUN: 12 mg/dL (ref 6–23)
CALCIUM: 8.8 mg/dL (ref 8.4–10.5)
CO2: 19 mEq/L (ref 19–32)
Chloride: 105 mEq/L (ref 96–112)
Creatinine, Ser: 1.76 mg/dL — ABNORMAL HIGH (ref 0.50–1.35)
GFR calc Af Amer: 43 mL/min — ABNORMAL LOW (ref >90)
GFR, EST NON AFRICAN AMERICAN: 37 mL/min — AB (ref >90)
GLUCOSE: 129 mg/dL — AB (ref 70–99)
Potassium: 4.4 mEq/L (ref 3.7–5.3)
Sodium: 138 mEq/L (ref 137–147)

## 2014-02-26 LAB — CBC
HCT: 32.9 % — ABNORMAL LOW (ref 39.0–52.0)
Hemoglobin: 11.1 g/dL — ABNORMAL LOW (ref 13.0–17.0)
MCH: 31.9 pg (ref 26.0–34.0)
MCHC: 33.7 g/dL (ref 30.0–36.0)
MCV: 94.5 fL (ref 78.0–100.0)
PLATELETS: 189 10*3/uL (ref 150–400)
RBC: 3.48 MIL/uL — ABNORMAL LOW (ref 4.22–5.81)
RDW: 14 % (ref 11.5–15.5)
WBC: 7.6 10*3/uL (ref 4.0–10.5)

## 2014-02-26 LAB — TROPONIN I
Troponin I: <0.3 ng/mL (ref <0.30)
Troponin I: <0.3 ng/mL (ref <0.30)

## 2014-02-26 LAB — CK: CK TOTAL: 303 U/L — AB (ref 7–232)

## 2014-02-26 LAB — MAGNESIUM: Magnesium: 1.8 mg/dL (ref 1.5–2.5)

## 2014-02-26 LAB — LIPASE, BLOOD: LIPASE: 8 U/L — AB (ref 11–59)

## 2014-02-26 MED ORDER — FOLIC ACID 5 MG/ML IJ SOLN
1.0000 mg | Freq: Every day | INTRAMUSCULAR | Status: DC
  Administered 2014-02-26 – 2014-02-28 (×3): 1 mg via INTRAVENOUS
  Filled 2014-02-26 (×4): qty 0.2

## 2014-02-26 MED ORDER — VALPROATE SODIUM 500 MG/5ML IV SOLN
500.0000 mg | Freq: Three times a day (TID) | INTRAVENOUS | Status: DC
  Administered 2014-02-26 – 2014-02-28 (×7): 500 mg via INTRAVENOUS
  Filled 2014-02-26 (×10): qty 5

## 2014-02-26 MED ORDER — LORAZEPAM 2 MG/ML IJ SOLN
1.0000 mg | INTRAMUSCULAR | Status: DC | PRN
  Administered 2014-02-26 (×2): 1 mg via INTRAVENOUS
  Filled 2014-02-26 (×2): qty 1

## 2014-02-26 MED ORDER — THIAMINE HCL 100 MG/ML IJ SOLN
100.0000 mg | Freq: Every day | INTRAMUSCULAR | Status: DC
  Administered 2014-02-26 – 2014-02-28 (×3): 100 mg via INTRAVENOUS
  Filled 2014-02-26 (×4): qty 1

## 2014-02-26 MED ORDER — ENOXAPARIN SODIUM 30 MG/0.3ML ~~LOC~~ SOLN
30.0000 mg | SUBCUTANEOUS | Status: DC
  Administered 2014-02-26 – 2014-02-27 (×2): 30 mg via SUBCUTANEOUS
  Filled 2014-02-26 (×2): qty 0.3

## 2014-02-26 MED ORDER — HYDRALAZINE HCL 20 MG/ML IJ SOLN
5.0000 mg | Freq: Four times a day (QID) | INTRAMUSCULAR | Status: DC | PRN
  Administered 2014-02-26 – 2014-02-27 (×2): 5 mg via INTRAVENOUS
  Filled 2014-02-26 (×2): qty 1

## 2014-02-26 MED ORDER — VALPROATE SODIUM 500 MG/5ML IV SOLN
1.0000 g | Freq: Once | INTRAVENOUS | Status: AC
  Administered 2014-02-26: 1000 mg via INTRAVENOUS
  Filled 2014-02-26 (×2): qty 10

## 2014-02-26 MED ORDER — DEXTROSE-NACL 5-0.45 % IV SOLN
INTRAVENOUS | Status: DC
  Administered 2014-02-26: 07:00:00 via INTRAVENOUS

## 2014-02-26 MED ORDER — BIOTENE DRY MOUTH MT LIQD
15.0000 mL | Freq: Two times a day (BID) | OROMUCOSAL | Status: DC
  Administered 2014-02-26 – 2014-02-28 (×5): 15 mL via OROMUCOSAL

## 2014-02-26 MED ORDER — CHLORHEXIDINE GLUCONATE 0.12 % MT SOLN
15.0000 mL | Freq: Two times a day (BID) | OROMUCOSAL | Status: DC
  Administered 2014-02-27 (×2): 15 mL via OROMUCOSAL
  Filled 2014-02-26 (×8): qty 15

## 2014-02-26 NOTE — Progress Notes (Signed)
Subjective: Pt appears to continue on post-ictal state, mentation improved slightly as he is alerted to self but still not following commands.   Objective: Vital signs in last 24 hours: Filed Vitals:   02/26/14 0438 02/26/14 0818 02/26/14 0819 02/26/14 1106  BP: 148/87  159/91 144/86  Pulse: 69  64 70  Temp: 97.9 F (36.6 C) 97.4 F (36.3 C)  97.6 F (36.4 C)  TempSrc: Oral Axillary  Oral  Resp: 15  17 18   Height:      Weight:      SpO2: 98%  100% 100%   Weight change:   Intake/Output Summary (Last 24 hours) at 02/26/14 1441 Last data filed at 02/26/14 1111  Gross per 24 hour  Intake 448.33 ml  Output   1030 ml  Net -581.67 ml   Vitals reviewed. General: resting in bed, in NAD HEENT: PERRL, EOMI, no scleral icterus Cardiac: RRR, no rubs, murmurs or gallops Pulm: clear to auscultation bilaterally, no wheezes, rales, or rhonchi Abd: soft, nontender, nondistended, BS present Ext: warm and well perfused, no pedal edema Neuro: No focal weakness, not following commands. Patellar and biceps reflexes 2+ bilaterally.   Lab Results: Basic Metabolic Panel:  Recent Labs Lab 02/25/14 2012 02/25/14 2027 02/26/14 0310 02/26/14 0700  NA 134* 137 138  --   K 4.9 4.7 4.4  --   CL 99 105 105  --   CO2 14*  --  19  --   GLUCOSE 277* 280* 129*  --   BUN 12 12 12   --   CREATININE 2.22* 2.70* 1.76*  --   CALCIUM 9.4  --  8.8  --   MG  --   --   --  1.8   Liver Function Tests:  Recent Labs Lab 02/25/14 2012  AST 16  ALT 10  ALKPHOS 87  BILITOT 0.4  PROT 7.8  ALBUMIN 4.4    Recent Labs Lab 02/26/14 0135  LIPASE 8*   CBC:  Recent Labs Lab 02/25/14 2012 02/25/14 2027 02/26/14 0310  WBC 12.0*  --  7.6  NEUTROABS 11.0*  --   --   HGB 12.7* 13.9 11.1*  HCT 37.6* 41.0 32.9*  MCV 94.7  --  94.5  PLT 208  --  189   Cardiac Enzymes:  Recent Labs Lab 02/26/14 0135 02/26/14 0700  CKTOTAL 303*  --   TROPONINI <0.30 <0.30   CBG:  Recent Labs Lab  02/25/14 2007  GLUCAP 273*   Urine Drug Screen: Drugs of Abuse     Component Value Date/Time   LABOPIA NONE DETECTED 02/25/2014 2118   COCAINSCRNUR NONE DETECTED 02/25/2014 2118   LABBENZ POSITIVE* 02/25/2014 2118   AMPHETMU NONE DETECTED 02/25/2014 2118   THCU NONE DETECTED 02/25/2014 2118   LABBARB NONE DETECTED 02/25/2014 2118    Alcohol Level:  Recent Labs Lab 02/25/14 2013  ETH <11   Urinalysis:  Recent Labs Lab 02/25/14 2118  COLORURINE YELLOW  LABSPEC 1.013  PHURINE 5.0  GLUCOSEU 100*  HGBUR NEGATIVE  BILIRUBINUR NEGATIVE  KETONESUR NEGATIVE  PROTEINUR NEGATIVE  UROBILINOGEN 0.2  NITRITE NEGATIVE  LEUKOCYTESUR NEGATIVE    Micro Results: No results found for this or any previous visit (from the past 240 hour(s)). Studies/Results: Ct Head Wo Contrast  02/25/2014   CLINICAL DATA:  All seizure  EXAM: CT HEAD WITHOUT CONTRAST  TECHNIQUE: Contiguous axial images were obtained from the base of the skull through the vertex without intravenous contrast.  COMPARISON:  None.  FINDINGS: No acute intracranial hemorrhage. No focal mass lesion. No CT evidence of acute infarction. No midline shift or mass effect. No hydrocephalus. Basilar cisterns are patent. .  There is extensive cortical atrophy. There is mild periventricular white matter hypodensities.  Small amount of fluid in the left mastoid air cells. There is opacification of the right maxillary sinus. Frontal sinuses are clear.  IMPRESSION: 1. No acute intracranial findings. 2. Significant cortical atrophy. 3. Mild microvascular disease. 4. Fluid in the left mastoid air cells. 5. Opacification of the right maxillary sinus suggests chronic sinusitis.   Electronically Signed   By: Suzy Bouchard M.D.   On: 02/25/2014 20:38   Medications: I have reviewed the patient's current medications. Scheduled Meds: . antiseptic oral rinse  15 mL Mouth Rinse q12n4p  . chlorhexidine  15 mL Mouth Rinse BID  . enoxaparin (LOVENOX)  injection  30 mg Subcutaneous Q24H  . folic acid  1 mg Intravenous Daily  . thiamine  100 mg Intravenous Daily  . valproate sodium  500 mg Intravenous 3 times per day   Continuous Infusions: . dextrose 5 % and 0.45% NaCl 100 mL/hr at 02/26/14 0718   PRN Meds:.LORazepam Assessment/Plan: 30 yr man with PMH of EthOH abuse, DM2, HTN, presenting with seizure, time of last alcoholic drink unknown.   Seizure: Unclear etiology though alcohol withdrawal seizure likely. Stat EEG read: "suggestive of a potential area of epileptogenicity in the left frontotemporal region in the setting of a generalized non-specific cerebral dysfunction (encephalopathy)".  -Continue Stepdown with seizure precautions -Neurology following -Loaded with Keprra -Ativan PRN -Thiamine and folate -NPO   AKI: Unclear etiology. Cr 2.7 on presentation, down to 1.76 with IV rehydration. BUN:Cr<20 but decreased po intake likely etiology. At risk for rhabdo but UA negative for Hgb and RBC. CK only mildly elevated (303).  -IVF: D5 1/2 NS 119ml/hr -Cr monitoring in am  Increased AG: 21 on presentation with bicarb of 14, lactic acid wnl. Improved to 14 after 2L NS bolus.  -Continue IVF and cmet in am  Alcohol abuse: Last alcoholic drink timing is unknown.  -Thiamine and folate -Ativan 1mg  IV q4hr PRN for seizure  -May need CIWA monitoring if still within the window for withdrawal seizure.   VTE prophylaxis:  Lovenox SQ daily   Dispo: Disposition is deferred at this time, awaiting improvement of current medical problems.  Anticipated discharge in approximately 2-3day(s).   The patient does have a current PCP Dr. Doug Sou  and does need an [facility] hospital follow-up appointment after discharge.  The patient does have transportation limitations that hinder transportation to clinic appointments.  .Services Needed at time of discharge: Y = Yes, Blank = No PT:   OT:   RN:   Equipment:   Other:     LOS: 1 day   Blain Pais, MD 02/26/2014, 2:41 PM

## 2014-02-26 NOTE — Progress Notes (Signed)
Stat EEG completed. Dr Kirkpatrick aware. 

## 2014-02-26 NOTE — H&P (Signed)
Date: 02/26/2014       [id number]: original medical record number        Patient Name:  Rana Barrus MRN: [id number]: merge with previous MRN!!!  DOB: [birth year] Age / Sex: 68 y.o., male   PCP: No primary provider on file.         Medical Service: Internal Medicine Teaching Service         Attending Physician: Dr. Annia Belt, MD    First Contact: Dr.  Karalee Height: 23-  Second Contact: Dr. Hayes Ludwig Pager: [phone]       After Hours (After 5p/  First Contact Pager: [phone]  weekends / holidays): Second Contact Pager: [phone]   Chief Complaint: Seizures  History of Present Illness: 68 year old male presents by EMS after having a seizure. History could not be ascertained as pt is presently unresponsive and no family available. Hx gotten from Ed notes, Per EMS on arrival pt was actively seizing, was given 5 mg Versed IM and then 2.5 mg IV. Patient stopped seizing but is continuing to have his eyes drift to the left. No more shaking noticed. The family was not helpful for history per EMS, and no family in the ER. They think he has had seizures before but is hard to tell. The patient a chronic alcoholic, its not known when last time he had any alcohol. Ed was able to get in contact with his significant other but she stated that her daughter, Ruffin Frederick, takes care of the patient and was the one that called EMS. No response on calling pts daughter-her phone (number [phone]). Neuro was consulted, pt continued to have eye deviation, and was given another 1mg  of ativan, with cessation of eye movements, but was still altered. As per Neuro notes- He lives with his daughter who states that she last saw him prior to going to bed last night 5/29. She hadnt seen pt till this evening, found him on the floor between his bed and the wall seizing repeatedly hitting his right hand against the floor. It is unclear how long he had been this way. She states that it appears that he had not gotten dressed  this morning, though not exactly sure as it is noted she is a poor historian.  He typically drinks 2 22 ounce ice house beers per day. He drank is normal amount on the 29th.  He has a history of a single previous seizure  Meds: Current Facility-Administered Medications  Medication Dose Route Frequency Provider Last Rate Last Dose  . LORazepam (ATIVAN) injection 1 mg  1 mg Intravenous Q4H PRN Olga Millers, MD        Allergies: Allergies as of 02/25/2014  . (No Known Allergies)   Past Medical History  Diagnosis Date  . Diabetes mellitus without complication   . Hypertension   . Hearing impairment   . Cataract    History reviewed. No pertinent past surgical history. No family history on file. History   Social History  . Marital Status: Single    Spouse Name: N/A    Number of Children: N/A  . Years of Education: N/A   Occupational History  . Not on file.   Social History Main Topics  . Smoking status: Current Every Day Smoker    Types: Cigars  . Smokeless tobacco: Not on file  . Alcohol Use: Yes  . Drug Use: No  . Sexual Activity: Not on file   Other Topics Concern  . Not on file  Social History Narrative  . No narrative on file    Review of Systems: Could not be ascertained due to pts altered mental status.  Limited Physical Exam: Blood pressure 154/91, pulse 89, temperature 98.1 F (36.7 C), temperature source Oral, resp. rate 16, SpO2 100.00%. Limited by pts AMS. GENERAL- Lying in bed, nasal trumpet in place- Right nostril, Unresponsive to commands, dosent open eyes, Moves all extremities in response to pain. HEENT- Multiple ~1 by 1 cm reddish macular lesions on pts scalp, normocephalic, pupil equal round but unresponsive, likely due to post-ictal status, foaming present on the lips. CARDIAC- RRR, no murmurs, rubs or gallops. RESP- Moving equal volumes of air, and clear to auscultation bilaterally, no wheezes or crackles. ABDOMEN- Soft, equivocal-  moves head with abdominal palpation- all quadrants , no guarding no palpable masses or organomegaly, bowel sounds present. BACK- Not assessed. NEURO- No obvious facial asymetry, DTRs lower extremities- 2+ EXTREMITIES- pulse 2+, symmetric, no pedal edema, scratches anterior legs. SKIN- Warm, dry, No rash or lesion. PSYCH- Normal mood and affect, appropriate thought content and speech.  Lab results: Basic Metabolic Panel:  Recent Labs  02/25/14 2012 02/25/14 2027  NA 134* 137  K 4.9 4.7  CL 99 105  CO2 14*  --   GLUCOSE 277* 280*  BUN 12 12  CREATININE 2.22* 2.70*  CALCIUM 9.4  --   Calc AG- 21.  Liver Function Tests:  Recent Labs  02/25/14 2012  AST 16  ALT 10  ALKPHOS 87  BILITOT 0.4  PROT 7.8  ALBUMIN 4.4   CBC:  Recent Labs  02/25/14 2012 02/25/14 2027  WBC 12.0*  --   NEUTROABS 11.0*  --   HGB 12.7* 13.9  HCT 37.6* 41.0  MCV 94.7  --   PLT 208  --    CBG:  Recent Labs  02/25/14 2007  GLUCAP 273*   Hemoglobin A1C: No results found for this basename: HGBA1C,  in the last 72 hours Fasting Lipid Panel: No results found for this basename: CHOL, HDL, LDLCALC, TRIG, CHOLHDL, LDLDIRECT,  in the last 72 hours Thyroid Function Tests: No results found for this basename: TSH, T4TOTAL, FREET4, T3FREE, THYROIDAB,  in the last 72 hours Anemia Panel: No results found for this basename: VITAMINB12, FOLATE, FERRITIN, TIBC, IRON, RETICCTPCT,  in the last 72 hours Coagulation: No results found for this basename: LABPROT, INR,  in the last 72 hours Urine Drug Screen: Drugs of Abuse     Component Value Date/Time   LABOPIA NONE DETECTED 02/25/2014 2118   [place] 02/25/2014 2118   LABBENZ POSITIVE* 02/25/2014 2118   AMPHETMU NONE DETECTED 02/25/2014 2118   THCU NONE DETECTED 02/25/2014 2118   LABBARB NONE DETECTED 02/25/2014 2118    Alcohol Level:  Recent Labs  02/25/14 2013  ETH <11   Urinalysis:  Recent Labs  02/25/14 2118  COLORURINE  YELLOW  LABSPEC 1.013  PHURINE 5.0  GLUCOSEU 100*  HGBUR NEGATIVE  BILIRUBINUR NEGATIVE  KETONESUR NEGATIVE  PROTEINUR NEGATIVE  UROBILINOGEN 0.2  NITRITE NEGATIVE  LEUKOCYTESUR NEGATIVE    Imaging results:  Ct Head Wo Contrast  02/25/2014   CLINICAL DATA:  All seizure  EXAM: CT HEAD WITHOUT CONTRAST  TECHNIQUE: Contiguous axial images were obtained from the base of the skull through the vertex without intravenous contrast.  COMPARISON:  None.  FINDINGS: No acute intracranial hemorrhage. No focal mass lesion. No CT evidence of acute infarction. No midline shift or mass effect. No hydrocephalus. Basilar cisterns  are patent. .  There is extensive cortical atrophy. There is mild periventricular white matter hypodensities.  Small amount of fluid in the left mastoid air cells. There is opacification of the right maxillary sinus. Frontal sinuses are clear.  IMPRESSION: 1. No acute intracranial findings. 2. Significant cortical atrophy. 3. Mild microvascular disease. 4. Fluid in the left mastoid air cells. 5. Opacification of the right maxillary sinus suggests chronic sinusitis.   Electronically Signed   By: Suzy Bouchard M.D.   On: 02/25/2014 20:38    Other results: EKG: no Ekg.  Assessment & Plan by Problem: Principal Problem:   Seizure Active Problems:   ARF (acute renal failure)  Seizure- Exact etiology not identified at this time, better hx required from pt when his mental status has improved- Po Intake, Fluid loss, medication he has been taking, alcohol intake, recent illness. Pt still altered. Pt with reported chronic alcohol abuse, so likely cause of seizures is alcohol withdrawal, Blood alcohol level <11, but CMP without abnormality reflecting alcohol abuse. Ct head- negative for any acute abnormalities, but with significant cerebral atrophy. Unlikely hypoglycemia with - CBG- 273 on arrival in the Ed. UDS positive for benzos only but pt was given benzos prior to admission. Pt is  afebrile but CBC with mild elevation in WBC- 12- likely stress reaction from seizures, lactic acid- WNL- 1.17, no concern for infectious etiology at this time. Neurology consulted in the Ed at [place] with Iv keppra- 1000mg , additional ativan- 2mg  x1, Stat EEG- to rule out going status epilepticus.  -  Admit to step down. - NPO - Trops X3 and EKG, if first trop is negative,can cancel the rest- To rule out ACS. - EKG - Lipase- ?Abdominal tenderness on exam - Ativan- 1mg  Q4H PRN for seizures. - Avoid sedating meds. - Bmet and CBC in the am - Neuro checks- Q4H - Seizure precautions - thiamine  AKI- Exact etiology unknown. Cr- 2.22. BUN- WNL- 12, BUN: Cr ratio- <20, suggesting intrinsic renal failure. Rule out Rhabdomyolysis- unsure how long was down and was having seizures. But UA negative for blood/Hgb. Also hx of NSAID use not known. If pt has been feeling ill, consider dehydration from reduced PO intake, also not known if pt was having diarrhea or vomiting.  - CK level.  Increased AG- 21, Bicarb- low 14. Likely due to recent seizure or kidney injury. Lactic acid-i-stat- WNL, pt not a known diabetic- but blood glucose elevated on CMP, with glycosuria. - Pt got 2L of IVF in the Ed. - Bmet in the Am.  DVt PPx- Enoxaparin 30mg  daily.  Dispo: Disposition is deferred at this time, awaiting improvement of current medical problems.   Signed: Jenetta Downer, MD 02/26/2014, 12:34 AM  Attending physician admission note: I have personally interviewed and examined this patient and I attest to the accuracy of the above history physical examination and management plan by Dr. Denton Brick.  Of note: This patient was given a second medical record number which resulted in the inability to see all previous records in this hospital under the original medical record number. These records were not available to the medical staff at time of the patient's current admission.  68 year old man with prior  history of alcohol abuse and a previous seizure likely related to the alcohol. He has had multiple prior admissions for acute alcohol intoxication and related to electrolyte abnormalities (hyponatremia). He was found on the floor by his daughter where he was for an undefined amount of time.  He was actively seizing. Emergency medical service called and he was transported to the emergency department where he was unresponsive and continued to have seizure activity. Keppra, lorazepam, and valproate were administered in the emergency department. Noncontrast CT scan of the brain shows no focal mass lesion or bleeding. There is chronic cortical atrophy. There is a chronic maxillary sinusitis. Stat EEG does show a epileptic focus.  He was afebrile. Minimal elevation of his white blood count of 12,000 which on repeat was 7600. Alcohol level 11 with normal level less than 11 mg percent.  Blood sugar elevated to 73. Chronic renal insufficiency with creatinine 2.2. Elevated anion gap of 21 with serum bicarbonate 14. The seizures have stopped. 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 He is otherwise disoriented, writhing in the bed, trying to remove protective mittens.  Physical exam: Elderly, unkempt man not following most commands. The pupils are equal round and reactive to light, slight facial asymmetry with left facial droop, tongue is midline, he is moving all extremities against gravity and resistance. There is a regular cardiac rhythm. Lungs overall clear but poor cooperation with exam. No focal laceration. Skin is thin. There are scattered large ecchymoses on his arms.  Impression: #1. Acute grand mal seizures likely related to alcohol withdrawal. No evidence for an intracranial process such as subdural hematoma.  #2. Poorly controlled type 2 diabetes secondary to noncompliance  #3. Essential hypertension  #4. Chronic renal insufficiency  Plan:  I agree with plan as outlined by  resident physician Dr.Emokpae

## 2014-02-26 NOTE — Progress Notes (Signed)
Paged doctor to notify of pts bp 188/113. New order for hydralazine received.

## 2014-02-26 NOTE — Procedures (Signed)
History: 68 yo M presenting with status epilepticus. This was a STAT EEG to rule out status epilepticus.   Sedation: Ativan earlier in the evening.   Technique: This is a 17 channel routine scalp EEG performed at the bedside with bipolar and monopolar montages arranged in accordance to the international 10/20 system of electrode placement. One channel was dedicated to EKG recording.    Background: The background consists of irregular delta activity with occasional sleep spindles seen. There are  low voltage discharges seen with a left frontotemporal predominance that occur frequently(not periodically) through the recording.  Even following noxious stimulation, there was no clear PDR seen.   Photic stimulation: Physiologic driving is not performed.   EEG Abnormalities: 1) Frequent left frontotemporal sharp wave discharges  2) generalized irregular delta activity.   Clinical Interpretation: This EEG is suggestive of a potential area of epileptogenicity in the left frontotemporal region in the setting of a generalized non-specific cerebral dysfunction(encephalopathy). There was no seizure recorded on this study.   Roland Rack, MD [facility] [phone]  If 7pm- 7am, please page neurology on call as listed in [place].

## 2014-02-27 DIAGNOSIS — R569 Unspecified convulsions: Secondary | ICD-10-CM | POA: Diagnosis not present

## 2014-02-27 DIAGNOSIS — I10 Essential (primary) hypertension: Secondary | ICD-10-CM

## 2014-02-27 DIAGNOSIS — F1010 Alcohol abuse, uncomplicated: Secondary | ICD-10-CM

## 2014-02-27 LAB — CBC
HEMATOCRIT: 38.3 % — AB (ref 39.0–52.0)
Hemoglobin: 12.8 g/dL — ABNORMAL LOW (ref 13.0–17.0)
MCH: 32.1 pg (ref 26.0–34.0)
MCHC: 33.4 g/dL (ref 30.0–36.0)
MCV: 96 fL (ref 78.0–100.0)
PLATELETS: 202 10*3/uL (ref 150–400)
RBC: 3.99 MIL/uL — ABNORMAL LOW (ref 4.22–5.81)
RDW: 13.9 % (ref 11.5–15.5)
WBC: 5.4 10*3/uL (ref 4.0–10.5)

## 2014-02-27 LAB — BASIC METABOLIC PANEL
BUN: 7 mg/dL (ref 6–23)
CO2: 22 meq/L (ref 19–32)
CREATININE: 1.26 mg/dL (ref 0.50–1.35)
Calcium: 9.1 mg/dL (ref 8.4–10.5)
Chloride: 107 mEq/L (ref 96–112)
GFR calc non Af Amer: 56 mL/min — ABNORMAL LOW (ref >90)
GFR, EST AFRICAN AMERICAN: 65 mL/min — AB (ref >90)
Glucose, Bld: 95 mg/dL (ref 70–99)
Potassium: 4 mEq/L (ref 3.7–5.3)
Sodium: 142 mEq/L (ref 137–147)

## 2014-02-27 LAB — GLUCOSE, CAPILLARY
Glucose-Capillary: 164 mg/dL — ABNORMAL HIGH (ref 70–99)
Glucose-Capillary: 147 mg/dL — ABNORMAL HIGH (ref 70–99)

## 2014-02-27 MED ORDER — TERAZOSIN HCL 2 MG PO CAPS
2.0000 mg | ORAL_CAPSULE | Freq: Two times a day (BID) | ORAL | Status: DC
  Administered 2014-02-27 (×2): 2 mg via ORAL
  Filled 2014-02-27 (×5): qty 1

## 2014-02-27 MED ORDER — ENOXAPARIN SODIUM 40 MG/0.4ML ~~LOC~~ SOLN
40.0000 mg | SUBCUTANEOUS | Status: DC
  Administered 2014-02-28: 40 mg via SUBCUTANEOUS
  Filled 2014-02-27 (×2): qty 0.4

## 2014-02-27 NOTE — Progress Notes (Signed)
Subjective: Patient is alert, oriented to place and why he is here, he knows it is May and 2015.  He states he usually drinks 6-12 beers a day but has cut back.   Objective: Current vital signs: BP 177/100  Pulse 77  Temp(Src) 97.9 F (36.6 C) (Oral)  Resp 17  Ht 5\' 8"  (1.727 m)  Wt 80.8 kg (178 lb 2.1 oz)  BMI 27.09 kg/m2  SpO2 97% Vital signs in last 24 hours: Temp:  [97.6 F (36.4 C)-98.3 F (36.8 C)] 97.9 F (36.6 C) (06/01 0700) Pulse Rate:  [68-80] 77 (06/01 0454) Resp:  [12-19] 17 (06/01 0721) BP: (144-188)/(78-113) 177/100 mmHg (06/01 0720) SpO2:  [97 %-100 %] 97 % (06/01 0454)  Intake/Output from previous day: 05/31 0701 - 06/01 0700 In: 1147 [P.O.:2; I.V.:1030; IV Piggyback:115] Out: 3000 [Urine:3000] Intake/Output this shift:   Nutritional status: NPO  Neurologic Exam: Mental Status: Alert, oriented, thought content appropriate.  Speech fluent without evidence of aphasia.  Able to follow 3 step commands without difficulty. Cranial Nerves: II:  Visual fields grossly normal, pupils equal, round, reactive to light and accommodation III,IV, VI: ptosis not present, extra-ocular motions intact bilaterally V,VII: smile symmetric, facial light touch sensation normal bilaterally VIII: hearing normal bilaterally IX,X: gag reflex present XI: bilateral shoulder shrug XII: midline tongue extension without atrophy or fasciculations  Motor: Right : Upper extremity   5/5    Left:     Upper extremity   5/5  Lower extremity   5/5     Lower extremity   5/5 Tone and bulk:normal tone throughout; no atrophy noted Sensory: Pinprick and light touch intact throughout, bilaterally Deep Tendon Reflexes:  Right: Upper Extremity   Left: Upper extremity   biceps (C-5 to C-6) 2/4   biceps (C-5 to C-6) 2/4 tricep (C7) 2/4    triceps (C7) 2/4 Brachioradialis (C6) 2/4  Brachioradialis (C6) 2/4  Lower Extremity Lower Extremity  quadriceps (L-2 to L-4) 2/4   quadriceps (L-2 to L-4)  2/4 Achilles (S1) 2/4   Achilles (S1) 2/4  Plantars: Right: downgoing   Left: downgoing Cerebellar: normal finger-to-nose,  normal heel-to-shin test    Lab Results: Basic Metabolic Panel:  Recent Labs Lab 02/25/14 2012 02/25/14 2027 02/26/14 0310 02/26/14 0700 02/27/14 0345  NA 134* 137 138  --  142  K 4.9 4.7 4.4  --  4.0  CL 99 105 105  --  107  CO2 14*  --  19  --  22  GLUCOSE 277* 280* 129*  --  95  BUN 12 12 12   --  7  CREATININE 2.22* 2.70* 1.76*  --  1.26  CALCIUM 9.4  --  8.8  --  9.1  MG  --   --   --  1.8  --     Liver Function Tests:  Recent Labs Lab 02/25/14 2012  AST 16  ALT 10  ALKPHOS 87  BILITOT 0.4  PROT 7.8  ALBUMIN 4.4    Recent Labs Lab 02/26/14 0135  LIPASE 8*   No results found for this basename: AMMONIA,  in the last 168 hours  CBC:  Recent Labs Lab 02/25/14 2012 02/25/14 2027 02/26/14 0310 02/27/14 0345  WBC 12.0*  --  7.6 5.4  NEUTROABS 11.0*  --   --   --   HGB 12.7* 13.9 11.1* 12.8*  HCT 37.6* 41.0 32.9* 38.3*  MCV 94.7  --  94.5 96.0  PLT 208  --  189 202  Cardiac Enzymes:  Recent Labs Lab 02/26/14 0135 02/26/14 0700  CKTOTAL 303*  --   TROPONINI <0.30 <0.30    Lipid Panel: No results found for this basename: CHOL, TRIG, HDL, CHOLHDL, VLDL, LDLCALC,  in the last 168 hours  CBG:  Recent Labs Lab 02/25/14 2007  GLUCAP 273*    Microbiology: No results found for this or any previous visit.  Coagulation Studies: No results found for this basename: LABPROT, INR,  in the last 72 hours  Imaging: Ct Head Wo Contrast  02/25/2014   CLINICAL DATA:  All seizure  EXAM: CT HEAD WITHOUT CONTRAST  TECHNIQUE: Contiguous axial images were obtained from the base of the skull through the vertex without intravenous contrast.  COMPARISON:  None.  FINDINGS: No acute intracranial hemorrhage. No focal mass lesion. No CT evidence of acute infarction. No midline shift or mass effect. No hydrocephalus. Basilar cisterns  are patent. .  There is extensive cortical atrophy. There is mild periventricular white matter hypodensities.  Small amount of fluid in the left mastoid air cells. There is opacification of the right maxillary sinus. Frontal sinuses are clear.  IMPRESSION: 1. No acute intracranial findings. 2. Significant cortical atrophy. 3. Mild microvascular disease. 4. Fluid in the left mastoid air cells. 5. Opacification of the right maxillary sinus suggests chronic sinusitis.   Electronically Signed   By: Suzy Bouchard M.D.   On: 02/25/2014 20:38    Medications:  Scheduled: . antiseptic oral rinse  15 mL Mouth Rinse q12n4p  . chlorhexidine  15 mL Mouth Rinse BID  . enoxaparin (LOVENOX) injection  30 mg Subcutaneous Q24H  . folic acid  1 mg Intravenous Daily  . thiamine  100 mg Intravenous Daily  . valproate sodium  500 mg Intravenous 3 times per day   EEG: Clinical Interpretation: This EEG is suggestive of a potential area of epileptogenicity in the left frontotemporal region in the setting of a generalized non-specific cerebral dysfunction(encephalopathy). There was no seizure recorded on this study.   Assessment/Plan: 68 YO male with history of seizure presenting with seizure.  It is not clear if this is due to decrease in ETOH versus break through. EEG did show potential area of epileptogenicity in the left frontotemporal region in the setting of a generalized non-specific cerebral dysfunction(encephalopathy) but no clear seizure.  Due to EEG finding he was placed on VPA 500 mg TID.  Recommend continuing AED on discharge and cessation of ETOH.   No driving, operating heavy machinery, perform activities at heights, swimming or participation in water activities until release by outpatient physician.  This has been discussed with patient.        Etta Quill PA-C [facility] [phone]  02/27/2014, 10:10 AM

## 2014-02-27 NOTE — Progress Notes (Signed)
Report called to Judson Roch, RN, all questions answered. No complaints.

## 2014-02-27 NOTE — Progress Notes (Signed)
  Date: 02/27/2014  Patient name: William [name]  Medical record number: [id number]  Date of birth: 1945-12-20   This patient has been seen and the plan of care was discussed with the house staff. Please see their note for complete details. I concur with their findings with the following additions/corrections: [name] was seen on AM rounds. He was alert and knew year and place but got month (March) and president [name]) wrong. No agitation or sz activity. Appreciate neuro recs and pt on Valproic acid. OK to transfer to reg bed, D/C restraints, start diet, get PT consult. Pt states likes drinking and doesn't want to quit. Lives with DIL. Cr trending down. Possible D/C 1-2 days.   Bartholomew Crews, MD 02/27/2014, 2:20 PM

## 2014-02-27 NOTE — Evaluation (Signed)
Clinical/Bedside Swallow Evaluation Patient Details  Name: William [name] MRN: [id number] Date of Birth: 1946/06/28  Today's Date: 02/27/2014 Time: 1100-1126 SLP Time Calculation (min): 26 min  Past Medical History:  Past Medical History  Diagnosis Date  . Diabetes mellitus without complication   . Hypertension   . Hearing impairment   . Cataract   . HTN (hypertension), benign 02/26/2014  . Type II or unspecified type diabetes mellitus with neurological manifestations, not stated as uncontrolled 02/26/2014  . Chronic renal insufficiency, stage III (moderate) 02/26/2014  . Alcohol abuse 02/26/2014   Past Surgical History: History reviewed. No pertinent past surgical history. HPI:  68 YO male with history of seizure presenting with seizure.  It is not clear if this is due to decrease in ETOH versus break through.    Assessment / Plan / Recommendation Clinical Impression  Pt demonstrates evidence of mild dysphagia, likely baseline. He has no dentition and reports he prefers soft foods. He also has frequent, though apparently behavioral, throat clearing and hypernasal voicing with PO intake and speech. Do not suspect significant aspiration. Pt able to consume 8 oz of water via straw without struggle. Recommend Dys 2/thin liquids. No SLP f/u needed.     Aspiration Risk  Mild    Diet Recommendation Dysphagia 2 (Fine chop);Thin liquid   Liquid Administration via: Cup;Straw Medication Administration: Whole meds with liquid Supervision: Patient able to self feed Postural Changes and/or Swallow Maneuvers: Seated upright 90 degrees    Other  Recommendations Oral Care Recommendations: Oral care BID   Follow Up Recommendations  None    Frequency and Duration        Pertinent Vitals/Pain NA    SLP Swallow Goals     Swallow Study Prior Functional Status       General HPI: 68 YO male with history of seizure presenting with seizure.  It is not clear if this is due to decrease  in ETOH versus break through.  Type of Study: Bedside swallow evaluation Previous Swallow Assessment: none Diet Prior to this Study: NPO Temperature Spikes Noted: No Respiratory Status: Room air History of Recent Intubation: No Behavior/Cognition: Alert;Cooperative;Pleasant mood;Hard of hearing Oral Cavity - Dentition: Edentulous Self-Feeding Abilities: Able to feed self Patient Positioning: Upright in bed Baseline Vocal Quality: Clear Volitional Cough: Strong Volitional Swallow: Able to elicit    Oral/Motor/Sensory Function Overall Oral Motor/Sensory Function: Appears within functional limits for tasks assessed   Ice Chips     Thin Liquid Thin Liquid: Within functional limits Presentation: Cup;Straw;Self Fed    Nectar Thick Nectar Thick Liquid: Not tested   Honey Thick Honey Thick Liquid: Not tested   Puree Puree: Within functional limits   Solid   GO    Solid: Impaired Presentation: Self Fed Oral Phase Impairments: Impaired mastication      Herbie Baltimore, MA CCC-SLP [phone]  Katherene Ponto Claudius Mich 02/27/2014,11:33 AM

## 2014-02-27 NOTE — Progress Notes (Signed)
PT Cancellation Note  Patient Details Name: William [name] MRN: [id number] DOB: 23-Oct-1945   Cancelled Treatment:    Reason Eval/Treat Not Completed: Patient not medically ready (active bedrest orders)   Duncan Dull 02/27/2014, 1:30 PM Alben Deeds, [place] DPT  [phone]

## 2014-02-27 NOTE — Progress Notes (Signed)
Subjective: Pt mental status much improved today. Alert and oriented to person, and place, said it was 2015, march, and [name] was president. Pt said last he remembers is sitting on the porch, he doesn't recall having any fevers or pain, thinks his last alcohol drink was that morning, but cant exactly remember. Pt also says he was not trying to cut down on alcohol intake and he is not ready to quit.   Objective: Vital signs in last 24 hours: Filed Vitals:   02/27/14 0700 02/27/14 0720 02/27/14 0721 02/27/14 1229  BP:  177/100    Pulse:      Temp: 97.9 F (36.6 C)   98.3 F (36.8 C)  TempSrc: Oral   Oral  Resp:  16 17   Height:      Weight:      SpO2:  99%     Weight change:   Intake/Output Summary (Last 24 hours) at 02/27/14 1315 Last data filed at 02/27/14 1100  Gross per 24 hour  Intake 1098.67 ml  Output   2450 ml  Net -1351.33 ml   Vitals reviewed. General: communicating, following commands. HEENT: Lower jaw appears deviated to the right, PERRL, EOMI. Cardiac: RRR, no rubs, murmurs or gallops Pulm: clear to auscultation bilaterally, no wheezes, rales, or rhonchi Abd: soft, nontender, nondistended, BS present Ext: warm and well perfused, no pedal edema Neuro: Oriented to place and person, reported date- 2015 march, and kennedy was president, No focal weakness, moving all limbs spontaneously.  Lab Results: Basic Metabolic Panel:  Recent Labs Lab 02/26/14 0310 02/26/14 0700 02/27/14 0345  NA 138  --  142  K 4.4  --  4.0  CL 105  --  107  CO2 19  --  22  GLUCOSE 129*  --  95  BUN 12  --  7  CREATININE 1.76*  --  1.26  CALCIUM 8.8  --  9.1  MG  --  1.8  --    Liver Function Tests:  Recent Labs Lab 02/25/14 2012  AST 16  ALT 10  ALKPHOS 87  BILITOT 0.4  PROT 7.8  ALBUMIN 4.4    Recent Labs Lab 02/26/14 0135  LIPASE 8*   CBC:  Recent Labs Lab 02/25/14 2012  02/26/14 0310 02/27/14 0345  WBC 12.0*  --  7.6 5.4  NEUTROABS 11.0*  --   --    --   HGB 12.7*  < > 11.1* 12.8*  HCT 37.6*  < > 32.9* 38.3*  MCV 94.7  --  94.5 96.0  PLT 208  --  189 202  < > = values in this interval not displayed. Cardiac Enzymes:  Recent Labs Lab 02/26/14 0135 02/26/14 0700  CKTOTAL 303*  --   TROPONINI <0.30 <0.30   CBG:  Recent Labs Lab 02/25/14 2007  GLUCAP 273*   Urine Drug Screen: Drugs of Abuse     Component Value Date/Time   LABOPIA NONE DETECTED 02/25/2014 2118   COCAINSCRNUR NONE DETECTED 02/25/2014 2118   LABBENZ POSITIVE* 02/25/2014 2118   AMPHETMU NONE DETECTED 02/25/2014 2118   THCU NONE DETECTED 02/25/2014 2118   LABBARB NONE DETECTED 02/25/2014 2118    Alcohol Level:  Recent Labs Lab 02/25/14 2013  ETH <11   Urinalysis:  Recent Labs Lab 02/25/14 2118  COLORURINE YELLOW  LABSPEC 1.013  PHURINE 5.0  GLUCOSEU 100*  HGBUR NEGATIVE  BILIRUBINUR NEGATIVE  KETONESUR NEGATIVE  PROTEINUR NEGATIVE  UROBILINOGEN 0.2  NITRITE NEGATIVE  LEUKOCYTESUR NEGATIVE  Micro Results: No results found for this or any previous visit (from the past 240 hour(s)). Studies/Results: Ct Head Wo Contrast  02/25/2014   CLINICAL DATA:  All seizure  EXAM: CT HEAD WITHOUT CONTRAST  TECHNIQUE: Contiguous axial images were obtained from the base of the skull through the vertex without intravenous contrast.  COMPARISON:  None.  FINDINGS: No acute intracranial hemorrhage. No focal mass lesion. No CT evidence of acute infarction. No midline shift or mass effect. No hydrocephalus. Basilar cisterns are patent. .  There is extensive cortical atrophy. There is mild periventricular white matter hypodensities.  Small amount of fluid in the left mastoid air cells. There is opacification of the right maxillary sinus. Frontal sinuses are clear.  IMPRESSION: 1. No acute intracranial findings. 2. Significant cortical atrophy. 3. Mild microvascular disease. 4. Fluid in the left mastoid air cells. 5. Opacification of the right maxillary sinus suggests  chronic sinusitis.   Electronically Signed   By: Suzy Bouchard M.D.   On: 02/25/2014 20:38   Medications: I have reviewed the patient's current medications. Scheduled Meds: . antiseptic oral rinse  15 mL Mouth Rinse q12n4p  . chlorhexidine  15 mL Mouth Rinse BID  . enoxaparin (LOVENOX) injection  30 mg Subcutaneous Q24H  . folic acid  1 mg Intravenous Daily  . terazosin  2 mg Oral BID  . thiamine  100 mg Intravenous Daily  . valproate sodium  500 mg Intravenous 3 times per day   Continuous Infusions: . dextrose 5 % and 0.45% NaCl 100 mL/hr at 02/27/14 1000   PRN Meds:.hydrALAZINE, LORazepam Assessment/Plan: 59 yr man with PMH of EthOH abuse, DM2, HTN, presenting with seizure, time of last alcoholic drink unknown.   Seizure: Unclear etiology though alcohol withdrawal seizure likely. Stat EEG read: "suggestive of a potential area of epileptogenicity in the left frontotemporal region in the setting of a generalized non-specific cerebral dysfunction (encephalopathy)".  -Transfer to Tele- Alcohol hx, ?likely withdrawal -Neurology following- recommendation- AED on discharge, and cessation of alcohol intake. -Ativan PRN -Thiamine and folate - Speech eval- Rec Start Dysphagia 2 diet.  AKI: Resolved. Unclear etiology. Cr 2.7 on presentation, down to 1.76 with IV rehydration. BUN:Cr<20 but decreased po intake likely etiology.  -IVF: D/c D5 1/2 NS  - Start Oral intake.  Increased AG- Resolved: 21 on presentation with bicarb of 14, lactic acid wnl.  Improved with hydration, 13 today.   Alcohol abuse: Last alcoholic drink timing is unknown. Not tachycardic, but blood pressure elevated, pt has a hx of HTN.  -Thiamine and folate -Ativan 1mg  IV q4hr PRN for seizure  -CIWA  HTN- Home meds, from pts other chart, on Tamsulosin 2mg  daily due to prostate issues and Lisinopril. Hold lisinopril as pt has AKI on admission.  - Start Tamsulosin- 2mg  BID.  Diabetes- Last HgbA1c 12/2013 - 6.4,  Controlled on diet alone. - CBGs- Q4H - Cont to monitor  VTE prophylaxis:  Lovenox SQ daily   Dispo: Disposition is deferred at this time, awaiting improvement of current medical problems.  Anticipated discharge in approximately tomorrow.    The patient does have a current PCP Dr. Doug Sou  and does need an [facility] hospital follow-up appointment after discharge.  The patient does have transportation limitations that hinder transportation to clinic appointments.  .Services Needed at time of discharge: Y = Yes, Blank = No PT:   OT:   RN:   Equipment:   Other:     LOS: 2 days   Torin Whisner,  MD 02/27/2014, 1:15 PM

## 2014-02-28 LAB — VALPROIC ACID LEVEL: Valproic Acid Lvl: 71.7 ug/mL (ref 50.0–100.0)

## 2014-02-28 LAB — GLUCOSE, CAPILLARY
GLUCOSE-CAPILLARY: 142 mg/dL — AB (ref 70–99)
GLUCOSE-CAPILLARY: 131 mg/dL — AB (ref 70–99)
Glucose-Capillary: 118 mg/dL — ABNORMAL HIGH (ref 70–99)
Glucose-Capillary: 142 mg/dL — ABNORMAL HIGH (ref 70–99)

## 2014-02-28 MED ORDER — VITAMIN B-1 100 MG PO TABS: 100.0000 mg | ORAL_TABLET | Freq: Every day | ORAL | Status: DC

## 2014-02-28 MED ORDER — VALPROATE SODIUM 250 MG/5ML PO SYRP
500.0000 mg | ORAL_SOLUTION | Freq: Three times a day (TID) | ORAL | Status: DC
Start: 2014-02-28 — End: 2014-07-31

## 2014-02-28 MED ORDER — FOLIC ACID 1 MG PO TABS: 1.0000 mg | ORAL_TABLET | Freq: Every day | ORAL | Status: DC

## 2014-02-28 MED ORDER — TERAZOSIN HCL 2 MG PO CAPS
2.0000 mg | ORAL_CAPSULE | Freq: Every day | ORAL | Status: DC
  Administered 2014-02-28: 2 mg via ORAL
  Filled 2014-02-28 (×2): qty 1

## 2014-02-28 NOTE — Progress Notes (Signed)
Md notified that pt does not have a ride home. Stated it is ok for him to stay the night.

## 2014-02-28 NOTE — Clinical Social Work Note (Signed)
CSW has not received call from patient's family about patient discharging home. CSW has requested non emergency police to go to patient's residence to attempt to contact family to notify them of discharge. CSW gave non emergency police CSW's callback number and number to unit.   Liz Beach MSW, [place], [place], JI:[id number]

## 2014-02-28 NOTE — Clinical Social Work Note (Signed)
CSW received call from [facility] non emergency police stating that there was no answer at the address listed for patient.   Liz Beach MSW, [place], [place], JI:[id number]

## 2014-02-28 NOTE — Clinical Social Work Psychosocial (Signed)
Clinical Social Work Department BRIEF PSYCHOSOCIAL ASSESSMENT 02/28/2014  Patient:  William [name], William [name]     Account Number:  [id number]     Admit date:  02/25/2014  Clinical Social Worker:  Lovey Newcomer  Date/Time:  02/28/2014 11:30 AM  Referred by:  Physician  Date Referred:  02/28/2014 Referred for  SNF Placement   Other Referral:   Interview type:  Patient Other interview type:   Patient interviewed at bedside to complete assessment.    PSYCHOSOCIAL DATA Living Status:  FAMILY Admitted from facility:   Level of care:   Primary support name:  Blair Promise Bullins Primary support relationship to patient:  CHILD, ADULT Degree of support available:   Support is fair.    CURRENT CONCERNS Current Concerns  Post-Acute Placement   Other Concerns:    SOCIAL WORK ASSESSMENT / PLAN CSW met with patient at bedside to complete assessment. Patient states that he does not want to go to SNF. Per MD patient has capacity to make this decision. Patient states that he lives with his step daughter Christain [place], but patient is unable to give the number for Helene Kelp to [place]. Patient states that his step daughter and friends help with giving him rides and looking out for him. CSW has made several attempts to contact family and Izora Gala (emergeny contact) without success. CSW has sent non-emergency police transport to patient's home in attempts to reach family without success.   Assessment/plan status:  No Further Intervention Required Other assessment/ plan:   NONE   Information/referral to community resources:   NONE    PATIENT'S/FAMILY'S RESPONSE TO PLAN OF CARE: Patient refuses SNF placement and states that he will be discharging home. Per MD patient has capacity to make this decision. CSW has attempted to assist with contacting patient's family/friends per MD's request with no success. CSW has exhausted all efforts to contact friends or family of patient.       Liz Beach MSW, [place], [place], [phone]

## 2014-02-28 NOTE — Discharge Instructions (Signed)
-You were hospitalized and treated for seizures. You were seen by a Neurologist for this problem who recommended that you continue taking an anti-seizure medication called valproic acid.   -You need to make an appointment to be seen at the [facility] Medicine [facility] as you may need a referral to see a Neurologist again.   -It is very important that you seek help to cut back on your alcohol consumption.    Alcohol Withdrawal Anytime drug use is interfering with normal living activities it has become abuse. This includes problems with family and friends. Psychological dependence has developed when your mind tells you that the drug is needed. This is usually followed by physical dependence when a continuing increase of drugs are required to get the same feeling or "high." This is known as addiction or chemical dependency. A person's risk is much higher if there is a history of chemical dependency in the family. Mild Withdrawal Following Stopping Alcohol, When Addiction or Chemical Dependency Has Developed When a person has developed tolerance to alcohol, any sudden stopping of alcohol can cause uncomfortable physical symptoms. Most of the time these are mild and consist of tremors in the hands and increases in heart rate, breathing, and temperature. Sometimes these symptoms are associated with anxiety, panic attacks, and bad dreams. There may also be stomach upset. Normal sleep patterns are often interrupted with periods of inability to sleep (insomnia). This may last for 6 months. Because of this discomfort, many people choose to continue drinking to get rid of this discomfort and to try to feel normal. Severe Withdrawal with Decreased or No Alcohol Intake, When Addiction or Chemical Dependency Has Developed About five percent of alcoholics will develop signs of severe withdrawal when they stop using alcohol. One sign of this is development of generalized seizures (convulsions). Other signs of this are  severe agitation and confusion. This may be associated with believing in things which are not real or seeing things which are not really there (delusions and hallucinations). Vitamin deficiencies are usually present if alcohol intake has been long-term. Treatment for this most often requires hospitalization and close observation. Addiction can only be helped by stopping use of all chemicals. This is hard but may save your life. With continual alcohol use, possible outcomes are usually loss of self respect and esteem, violence, and death. Addiction cannot be cured but it can be stopped. This often requires outside help and the care of professionals. Treatment centers are listed in the yellow pages under Cocaine, Narcotics, and Alcoholics Anonymous. Most hospitals and clinics can refer you to a specialized care center. It is not necessary for you to go through the uncomfortable symptoms of withdrawal. Your caregiver can provide you with medicines that will help you through this difficult period. Try to avoid situations, friends, or drugs that made it possible for you to keep using alcohol in the past. Learn how to say no. It takes a long period of time to overcome addictions to all drugs, including alcohol. There may be many times when you feel as though you want a drink. After getting rid of the physical addiction and withdrawal, you will have a lessening of the craving which tells you that you need alcohol to feel normal. Call your caregiver if more support is needed. Learn who to talk to in your family and among your friends so that during these periods you can receive outside help. Alcoholics Anonymous (AA) has helped many people over the years. To get further help, contact  AA or call your caregiver, counselor, or clergyperson. Al-Anon and Alateen are support groups for friends and family members of an alcoholic. The people who love and care for an alcoholic often need help, too. For information about these  organizations, check your phone directory or call a local alcoholism treatment center.  SEEK IMMEDIATE MEDICAL CARE IF:   You have a seizure.  You have a fever.  You experience uncontrolled vomiting or you vomit up blood. This may be bright red or look like black coffee grounds.  You have blood in the stool. This may be bright red or appear as a black, tarry, bad-smelling stool.  You become lightheaded or faint. Do not drive if you feel this way. Have someone else drive you or call [id number] for help.  You become more agitated or confused.  You develop uncontrolled anxiety.  You begin to see things that are not really there (hallucinate). Your caregiver has determined that you completely understand your medical condition, and that your mental state is back to normal. You understand that you have been treated for alcohol withdrawal, have agreed not to drink any alcohol for a minimum of 1 day, will not operate a car or other machinery for 24 hours, and have had an opportunity to ask any questions about your condition. Document Released: 06/25/2005 Document Revised: 12/08/2011 Document Reviewed: 05/03/2008 [facility] Patient Information 2014 [place].   Seizure, Adult A seizure means there is unusual activity in the brain. A seizure can cause changes in attention or behavior. Seizures often cause shaking (convulsions). Seizures often last from 30 seconds to 2 minutes. HOME CARE   If you are given medicines, take them exactly as told by your doctor.  Keep all doctor visits as told.  Do not swim or drive until your doctor says it is okay.  Teach others what to do if you have a seizure. They should:  Lay you on the ground.  Put a cushion under your head.  Loosen any tight clothing around your neck.  Turn you on your side.  Stay with you until you get better. GET HELP RIGHT AWAY IF:   The seizure lasts longer than 2 to 5 minutes.  The seizure is very bad.  The person does  not wake up after the seizure.  The person's attention or behavior changes. Drive the person to the emergency room or call your local emergency services (911 in [place] MAKE SURE YOU:   Understand these instructions.  Will watch your condition.  Will get help right away if you are not doing well or get worse. Document Released: 03/03/2008 Document Revised: 12/08/2011 Document Reviewed: 09/03/2011 [facility] Patient Information 2014 [place].

## 2014-02-28 NOTE — Progress Notes (Signed)
Patient's daughter called on [phone]. She states that she lives with the patient in the same house and she is ready to come get him. Provided her with patient location and phone number. Contacted Dr Hayes Ludwig who will put in discharge orders.

## 2014-02-28 NOTE — Discharge Summary (Signed)
Name: William [name] MRN: [id number] DOB: 1945/12/26 68 y.o. PCP: Olga Millers, MD  Date of Admission: 02/25/2014  7:47 PM Date of Discharge: 03/01/2014 Attending Physician: No att. providers found  Discharge Diagnosis:  Principal Problem:   Seizure Active Problems:   ARF (acute renal failure)   HTN (hypertension), benign   Type II or unspecified type diabetes mellitus with neurological manifestations, not stated as uncontrolled   Chronic renal insufficiency, stage III (moderate)   Alcohol abuse  Discharge Medications:   Medication List         FLUoxetine 40 MG capsule  Commonly known as:  PROZAC  Take 40 mg by mouth daily.     folic acid 1 MG tablet  Commonly known as:  FOLVITE  Take 1 tablet (1 mg total) by mouth daily.     oxybutynin 5 MG tablet  Commonly known as:  DITROPAN  Take 5 mg by mouth 2 (two) times daily.     simvastatin 20 MG tablet  Commonly known as:  ZOCOR  Take 20 mg by mouth daily.     terazosin 2 MG capsule  Commonly known as:  HYTRIN  Take 2 mg by mouth at bedtime.     thiamine 100 MG tablet  Commonly known as:  VITAMIN B-1  Take 1 tablet (100 mg total) by mouth daily.     valproate 250 MG/5ML syrup  Commonly known as:  DEPAKENE  Take 10 mLs (500 mg total) by mouth 3 (three) times daily.        Disposition and follow-up:   Mr.Anjelo GARCIA CARRISALEZ was discharged from [facility] in Good condition.  At the hospital follow up visit please address:  1.  Patient not ready to quit alcohol abuse, but the importance of quiting should be reiterated. On low dose of terazosin, BP within Acceptable limits? Compliance with VPA 500mg  TID started on admission, following EEG results of epileptogenic foci.  2.  Labs / imaging needed at time of follow-up: None   3.  Pending labs/ test needing follow-up: None  Follow-up Appointments: Follow-up Information   Follow up with Vertell Novak, MD. Schedule an appointment as  soon as possible for a visit in 1 week.   Specialty:  Internal Medicine   Contact information:   [place] [address] [phone]       Discharge Instructions: Discharge Instructions   Diet - low sodium heart healthy    Complete by:  As directed      Increase activity slowly    Complete by:  As directed      Other Restrictions    Complete by:  As directed   No driving, operating heavy machinery, perform activities at heights, swimming or participation in water activities until release by outpatient physician.           Consultations:  Neurology  Procedures Performed:  Ct Head Wo Contrast  02/25/2014   CLINICAL DATA:  All seizure  EXAM: CT HEAD WITHOUT CONTRAST  TECHNIQUE: Contiguous axial images were obtained from the base of the skull through the vertex without intravenous contrast.  COMPARISON:  None.  FINDINGS: No acute intracranial hemorrhage. No focal mass lesion. No CT evidence of acute infarction. No midline shift or mass effect. No hydrocephalus. Basilar cisterns are patent. .  There is extensive cortical atrophy. There is mild periventricular white matter hypodensities.  Small amount of fluid in the left mastoid air cells. There is opacification of the  right maxillary sinus. Frontal sinuses are clear.  IMPRESSION: 1. No acute intracranial findings. 2. Significant cortical atrophy. 3. Mild microvascular disease. 4. Fluid in the left mastoid air cells. 5. Opacification of the right maxillary sinus suggests chronic sinusitis.   Electronically Signed   By: Suzy Bouchard M.D.   On: 02/25/2014 20:38    2D Echo: None  Cardiac Cath: None  Admission HPI: Chief Complaint: Seizures   History of Present Illness: 68 year old male presents by EMS after having a seizure. History could not be ascertained as pt is presently unresponsive and no family available. Hx gotten from Ed notes, Per EMS on arrival pt was actively seizing, was given 5 mg Versed IM and then 2.5 mg  IV. Patient stopped seizing but is continuing to have his eyes drift to the left. No more shaking noticed. The family was not helpful for history per EMS, and no family in the ER. They think he has had seizures before but is hard to tell. The patient a chronic alcoholic, its not known when last time he had any alcohol. Ed was able to get in contact with his significant other but she stated that her daughter, Ruffin Frederick, takes care of the patient and was the one that called EMS. No response on calling pts daughter-her phone (number [phone]).  Neuro was consulted, pt continued to have eye deviation, and was given another 1mg  of ativan, with cessation of eye movements, but was still altered. As per Neuro notes- He lives with his daughter who states that she last saw him prior to going to bed last night 5/29. She hadnt seen pt till this evening, found him on the floor between his bed and the wall seizing repeatedly hitting his right hand against the floor. It is unclear how long he had been this way. She states that it appears that he had not gotten dressed this morning, though not exactly sure as it is noted she is a poor historian.  He typically drinks 2 22 ounce ice house beers per day. He drank is normal amount on the 29th.  He has a history of a single previous seizure.   Limited Physical Exam:  Blood pressure 154/91, pulse 89, temperature 98.1 F (36.7 C), temperature source Oral, resp. rate 16, SpO2 100.00%.  Limited by pts AMS.  GENERAL- Lying in bed, nasal trumpet in place- Right nostril, Unresponsive to commands, dosent open eyes, Moves all extremities in response to pain.  HEENT- Multiple ~1 by 1 cm reddish macular lesions on pts scalp, normocephalic, pupil equal round but unresponsive, likely due to post-ictal status, foaming present on the lips.  CARDIAC- RRR, no murmurs, rubs or gallops.  RESP- Moving equal volumes of air, and clear to auscultation bilaterally, no wheezes or crackles.    ABDOMEN- Soft, equivocal- moves head with abdominal palpation- all quadrants , no guarding no palpable masses or organomegaly, bowel sounds present.  BACK- Not assessed.  NEURO- No obvious facial asymetry, DTRs lower extremities- 2+  EXTREMITIES- pulse 2+, symmetric, no pedal edema, scratches anterior legs.  SKIN- Warm, dry, No rash or lesion.  PSYCH- Normal mood and affect, appropriate thought content and speech.   Hospital Course by problem list:   Seizure with AMS- Pt was transferred form [facility] . Has had previous similar presentation. On presentation pt was still altered. Pt with reported chronic alcohol abuse. Most likely cause of seizures was alcohol withdrawal. Blood alcohol level <11 on admission, but CMP without abnormality reflecting alcohol abuse. Ct  head- negative for any acute abnormalities, but with significant cerebral atrophy. CBG- 273 on arrival in the Ed. UDS positive for benzos only but pt was given benzos prior to admission, by EMS. Pt afebrile but CBC with mild elevation in WBC- 12- likely stress reaction from seizures which resolved, lactic acid- WNL- 1.17, no concern for infectious etiology. Bmet- Bicarb of 14, Cr- 2.22, BUN- 12, Anion gap-21.   Lipase WNL as there was some abdominal tenderness. ACS r/o with neg trops and EKG. Neurology consulted in the Ed, concern for ongoing seizures as pt was still altered, Vs post-ictal, recs- load with Iv keppra- 1000mg , ativan and Stat EEG was done to rule out ongoing status epilepticus. EEG showed Epileptogenic focus, but no seizure activity. As per neuro recs- considering EEG results pt should be placed on antiseizure meds- Pt was therefore placed on  VPA 500 mg TID, also counselled about not driving, operating heavy machinery, performing activities at heights, swimming or participation in water activities until release by outpatient physician. PT was consulted on admission and recs were for discharge to SNF, but pt declined. On discharge, pt  was back to his baseline, was oriented to TPP. Pt was discharged to home, message sent to front desk to follow up in clinic in the next 1-2 weeks.   AKI- Exact etiology unknown. Likely due to dehydration. On admission Cr- 2.22. BUN- WNL- 12, but BUN: Cr ratio- <20, suggesting intrinsic renal failure. Rule out Rhabdomyolysis, with CK- 303. With Iv hydration, AKI resolved. On discharge- Cr- 1.26, BUN- 7.  Increased AG- 21, Bicarb- low 14. Likely due to recent seizure vs kidney injury, vs alcoholic ketacidosis. Pt not a known diabetic. Lactic acid WNL.  HTN- Blood pressure elevated during admission. Terazosin 2mg  daily on admission, and 2mg  QHS on discharge.   Discharge Vitals:   BP 138/84  Pulse 72  Temp(Src) 98 F (36.7 C) (Oral)  Resp 20  Ht 5\' 8"  (1.727 m)  Wt 175 lb (79.379 kg)  BMI 26.61 kg/m2  SpO2 98%  Discharge Labs:  Results for orders placed during the hospital encounter of 02/25/14 (from the past 24 hour(s))  GLUCOSE, CAPILLARY     Status: Abnormal   Collection Time    02/28/14 11:37 PM      Result Value Ref Range   Glucose-Capillary 142 (*) 70 - 99 mg/dL   Comment 1 Documented in Chart     Comment 2 Notify RN      Signed: Jenetta Downer, MD 03/01/2014, 10:32 PM   Time Spent on Discharge: 35 minutes Services Ordered on Discharge: None Equipment Ordered on Discharge: None. Attending physician discharge note: Evaluation and management plan accurate as recorded above by resident physician Dr.Ejiroghene Denton Brick, MD. we anticipate further problems with this man due to compliance issues. Murriel Hopper, MD, [place]  Hematology-Oncology/Internal Medicine

## 2014-02-28 NOTE — Clinical Social Work Note (Signed)
CSW has attempted to contact Izora Gala and Marland Mcalpine (contacts listed for patient) but [facility] [place] number has been disconnected and CSW had to leave message with Izora Gala. Patient has refused SNF and per MD, patient has the capacity to make this decision.  Liz Beach MSW, [place], [place], [phone]

## 2014-02-28 NOTE — Progress Notes (Addendum)
Subjective: No complaints today. Having breakfast. Will like to go home today. No headaches.   Objective: Vital signs in last 24 hours: Filed Vitals:   02/27/14 1422 02/27/14 1705 02/27/14 2055 02/28/14 0548  BP: 156/86 110/78 125/88 108/68  Pulse:  98 83 76  Temp:  98.2 F (36.8 C) 98.1 F (36.7 C) 97.5 F (36.4 C)  TempSrc:  Oral Oral Oral  Resp: 17 18 18 18   Height:  5\' 8"  (1.727 m)    Weight:  175 lb (79.379 kg)    SpO2: 100% 98% 95% 96%   Weight change:   Intake/Output Summary (Last 24 hours) at 02/28/14 1034 Last data filed at 02/28/14 1019  Gross per 24 hour  Intake    240 ml  Output    400 ml  Net   -160 ml   Vitals reviewed. General: NAD, Alert and oriented to person and place. HEENT: [employer], old reddish scars on his scalp, PERRL, EOMI. Cardiac: RRR, no rubs, murmurs or gallops Pulm: Clear to auscultation bilaterally, no wheezes, rales, or rhonchi Abd: soft, nontender, BS present Ext: warm and well perfused, no pedal edema Neuro: Alert oriented, Strenght equal and present in all extremities.  Lab Results: Basic Metabolic Panel:  Recent Labs Lab 02/26/14 0310 02/26/14 0700 02/27/14 0345  NA 138  --  142  K 4.4  --  4.0  CL 105  --  107  CO2 19  --  22  GLUCOSE 129*  --  95  BUN 12  --  7  CREATININE 1.76*  --  1.26  CALCIUM 8.8  --  9.1  MG  --  1.8  --    Liver Function Tests:  Recent Labs Lab 02/25/14 2012  AST 16  ALT 10  ALKPHOS 87  BILITOT 0.4  PROT 7.8  ALBUMIN 4.4    Recent Labs Lab 02/26/14 0135  LIPASE 8*   CBC:  Recent Labs Lab 02/25/14 2012  02/26/14 0310 02/27/14 0345  WBC 12.0*  --  7.6 5.4  NEUTROABS 11.0*  --   --   --   HGB 12.7*  < > 11.1* 12.8*  HCT 37.6*  < > 32.9* 38.3*  MCV 94.7  --  94.5 96.0  PLT 208  --  189 202  < > = values in this interval not displayed. Cardiac Enzymes:  Recent Labs Lab 02/26/14 0135 02/26/14 0700  CKTOTAL 303*  --   TROPONINI <0.30 <0.30   CBG:  Recent Labs Lab  02/25/14 2007 02/27/14 1634 02/27/14 2202 02/28/14 0757  GLUCAP 273* 164* 147* 118*   Urine Drug Screen: Drugs of Abuse     Component Value Date/Time   LABOPIA NONE DETECTED 02/25/2014 2118   COCAINSCRNUR NONE DETECTED 02/25/2014 2118   LABBENZ POSITIVE* 02/25/2014 2118   AMPHETMU NONE DETECTED 02/25/2014 2118   THCU NONE DETECTED 02/25/2014 2118   LABBARB NONE DETECTED 02/25/2014 2118    Alcohol Level:  Recent Labs Lab 02/25/14 2013  ETH <11   Urinalysis:  Recent Labs Lab 02/25/14 2118  COLORURINE YELLOW  LABSPEC 1.013  PHURINE 5.0  GLUCOSEU 100*  HGBUR NEGATIVE  BILIRUBINUR NEGATIVE  KETONESUR NEGATIVE  PROTEINUR NEGATIVE  UROBILINOGEN 0.2  NITRITE NEGATIVE  LEUKOCYTESUR NEGATIVE    Micro Results: No results found for this or any previous visit (from the past 240 hour(s)). Studies/Results: No results found. Medications: I have reviewed the patient's current medications. Scheduled Meds: . antiseptic oral rinse  15 mL Mouth Rinse  q12n4p  . chlorhexidine  15 mL Mouth Rinse BID  . enoxaparin (LOVENOX) injection  40 mg Subcutaneous Q24H  . folic acid  1 mg Intravenous Daily  . terazosin  2 mg Oral QHS  . thiamine  100 mg Intravenous Daily  . valproate sodium  500 mg Intravenous 3 times per day   Continuous Infusions:   PRN Meds:.hydrALAZINE, LORazepam Assessment/Plan:  Seizure: Unclear etiology though alcohol withdrawal seizure likely. Stat EEG read: "suggestive of a potential area of epileptogenicity in the left frontotemporal region in the setting of a generalized non-specific cerebral dysfunction (encephalopathy)".  -Neurology Recs appreciated, Impression- Not clear if seizure due to ETOH vs breakthrough. Recommendation- AED on discharge, and cessation of alcohol intake. - Ativan PRN -Thiamine and folate - Pt recs- SNF/ 24 hr supervision. Have not been able to contact family members, called pts Daughter, and friend, also spouse listed on Epic.  AKI:  Resolved. Unclear etiology. Cr 2.7 on presentation, down to 1.76 with IV rehydration.   Increased AG- Resolved: 21 on presentation with bicarb of 14, lactic acid wnl.  Improved with hydration, 13 yesterday.   Alcohol abuse: Last alcoholic drink timing is unknown. Not tachycardic, but blood pressure elevated, pt has a hx of HTN.  -Thiamine and folate -Ativan 1mg  IV q4hr PRN for seizure  -CIWA  HTN- Home meds, from pts other chart, on Tamsulosin 2mg  daily due to prostate issues and Lisinopril. Hold lisinopril as pt has AKI on admission.  - Cont with Tamsulosin- 2mg  daily.  Diabetes- Last HgbA1c 12/2013 - 6.4, Controlled on diet alone. CBGs- 18 this Am. - CBGs- Q4H - Cont to monitor  VTE prophylaxis:  Lovenox SQ daily  Contact friend for ptLattie Haw [phone].  Dispo: Disposition is deferred at this time, awaiting improvement of current medical problems.  Anticipated discharge in approximately tomorrow.    The patient does have a current PCP Dr. Doug Sou  and does need an [facility] hospital follow-up appointment after discharge.  The patient does have transportation limitations that hinder transportation to clinic appointments.  .Services Needed at time of discharge: Y = Yes, Blank = No PT:   OT:   RN:   Equipment:   Other:     LOS: 3 days   Jenetta Downer, MD 02/28/2014, 10:34 AM

## 2014-02-28 NOTE — Evaluation (Signed)
Physical Therapy Evaluation Patient Details Name: William [name] MRN: [id number] DOB: 11/27/1945 Today's Date: 02/28/2014   History of Present Illness  Pt admitted 02/25/14 from home after witnessed seizure.   Clinical Impression  Pt very pleasant and able to mobilize and ambulate with 1 assist x 80'. Pt will benefit from PT while in acute care to address problems listed in note to improve safety and functional independence. No family present to discuss Dc plan and caregiver availability.   Follow Up Recommendations SNF;Supervision/Assistance - 24 hour    Equipment Recommendations  None recommended by PT    Recommendations for Other Services       Precautions / Restrictions Precautions Precautions: Fall Precaution Comments: seizure      Mobility  Bed Mobility Overal bed mobility: Needs Assistance Bed Mobility: Supine to Sit     Supine to sit: Supervision     General bed mobility comments: extra time to scoot.  Transfers Overall transfer level: Needs assistance Equipment used: Rolling walker (2 wheeled) Transfers: Sit to/from Stand Sit to Stand: Min assist         General transfer comment: leans posteriorly initially, extra tome to rise, steady assist rising.,extra time to tuurn to sit down, pt did use arm rests  to sit down  Ambulation/Gait Ambulation/Gait assistance: Min assist Ambulation Distance (Feet): 80 Feet Assistive device: Rolling walker (2 wheeled) Gait Pattern/deviations: Step-to pattern;Wide base of support;Decreased step length - left;Decreased step length - right Gait velocity: decr.   General Gait Details: small steps taken ,ens to weight shift to sides to advance legs, R knee flexed  Stairs            Wheelchair Mobility    Modified Rankin (Stroke Patients Only)       Balance Overall balance assessment: Needs assistance Sitting-balance support: No upper extremity supported;Feet supported Sitting balance-Leahy Scale: Fair      Standing balance support: No upper extremity supported;During functional activity Standing balance-Leahy Scale: Poor                               Pertinent Vitals/Pain Reports R leg is sore at the knee.    Home Living Family/patient expects to be discharged to:: Private residence Living Arrangements: Children (per pt"a girl i raised")   Type of Home: House Home Access: Stairs to enter Entrance Stairs-Rails: Psychiatric nurse of Steps: 2 Home Layout: One level Home Equipment: [place] - 4 wheels;Cane - single point (pt states he put 2 wheels on back of RW.) Additional Comments: no family present to determine DC plan/24 hour assistance.    Prior Function Level of Independence: Independent with assistive device(s)         Comments: does not drive     Hand Dominance        Extremity/Trunk Assessment   Upper Extremity Assessment: Generalized weakness;RUE deficits/detail;LUE deficits/detail RUE Deficits / Details: difficulty opening packets.         Lower Extremity Assessment: Generalized weakness      Cervical / Trunk Assessment: Kyphotic  Communication   Communication: HOH  Cognition Arousal/Alertness: Awake/alert Behavior During Therapy: WFL for tasks assessed/performed Overall Cognitive Status: Impaired/Different from baseline Area of Impairment: Orientation Orientation Level: Time;Situation                  General Comments      Exercises        Assessment/Plan    PT Assessment Patient needs  continued PT services  PT Diagnosis Difficulty walking;Generalized weakness   PT Problem List Decreased strength;Decreased activity tolerance;Decreased balance;Decreased mobility;Decreased knowledge of precautions;Decreased safety awareness;Decreased knowledge of use of DME;Decreased cognition  PT Treatment Interventions DME instruction;Gait training;Functional mobility training;Therapeutic activities;Patient/family  education   PT Goals (Current goals can be found in the Care Plan section) Acute Rehab PT Goals Patient Stated Goal: I want to go home and see my baby, MY CAT PT Goal Formulation: With patient Time For Goal Achievement: 03/14/14 Potential to Achieve Goals: Good    Frequency Min 3X/week   Barriers to discharge Decreased caregiver support      Co-evaluation               End of Session Equipment Utilized During Treatment: Gait belt Activity Tolerance: Patient tolerated treatment well Patient left: in chair;with call bell/phone within reach;with chair alarm set Nurse Communication: Mobility status         Time: [id number] PT Time Calculation (min): 35 min   Charges:   PT Evaluation $Initial PT Evaluation Tier I: 1 Procedure PT Treatments $Gait Training: 23-37 mins   PT G Codes:          Claretha Cooper 02/28/2014, 9:29 AM Tresa Endo PT [phone]

## 2014-02-28 NOTE — Progress Notes (Signed)
Daughter, William [name] called back. She asked if I can get him a voucher so I can send him home on the bus. RN explained to daughter that she will not send him home on a bus at 2200. She said she should be able to pick him up in the morning.

## 2014-02-28 NOTE — Progress Notes (Signed)
Attempted to call daughter Jeani Hawking. No answer, left a voicemail. Pt pulled out IV on his own.

## 2014-02-28 NOTE — Progress Notes (Signed)
  Date: 02/28/2014  Patient name: William [name]  Medical record number: [id number]  Date of birth: [birth year]   This patient has been seen and the plan of care was discussed with the house staff. Please see their note for complete details. I concur with their findings with the following additions/corrections: [name] was completing his bath. He has no complaints and stated eating well, slept well, and walked well. Pt rec 24 hour supervision or SNF. PT wants to return home - lives is DIL. Will need to speak to pt and family about 24 hour recs. Medically stable for D/C once home vs SNF decided.   Bartholomew Crews, MD 02/28/2014, 10:52 AM

## 2014-03-01 ENCOUNTER — Encounter: Payer: Self-pay | Admitting: Internal Medicine

## 2014-03-01 DIAGNOSIS — R4182 Altered mental status, unspecified: Secondary | ICD-10-CM

## 2014-03-01 DIAGNOSIS — I10 Essential (primary) hypertension: Secondary | ICD-10-CM | POA: Diagnosis not present

## 2014-03-01 DIAGNOSIS — N179 Acute kidney failure, unspecified: Secondary | ICD-10-CM

## 2014-03-01 DIAGNOSIS — R569 Unspecified convulsions: Secondary | ICD-10-CM | POA: Diagnosis not present

## 2014-03-01 NOTE — Care Management Note (Signed)
    Page 1 of 1   03/01/2014     8:20:24 AM CARE MANAGEMENT NOTE 03/01/2014  Patient:  OLDEN, WROE   Account Number:  [id number]  Date Initiated:  03/01/2014  Documentation initiated by:  Tomi Bamberger  Subjective/Objective Assessment:   dx seizure  admit- lives with daughter.     Action/Plan:   pt rec snf   Anticipated DC Date:  03/01/2014   Anticipated DC Plan:  HOME/SELF CARE      DC Planning Services  CM consult      Choice offered to / List presented to:             Status of service:  Completed, signed off Medicare Important Message given?  YES (If response is "NO", the following Medicare IM given date fields will be blank) Date Medicare IM given:  02/28/2014 Date Additional Medicare IM given:    Discharge Disposition:  HOME/SELF CARE  Per UR Regulation:  Reviewed for med. necessity/level of care/duration of stay  If discussed at [place] of Stay Meetings, dates discussed:    Comments:  03/01/14 0818 Tomi Bamberger RN, BSN [phone] patient lives with daughter, physical therapy rec snf, per note patient refused snf.  Patient was dc early this am around 0530 and there is no NCM referral.

## 2014-03-01 NOTE — Progress Notes (Signed)
Pts daughter at bedside. She refuses to wait for case management to set up transportation for them. Daughter stated she will catch a cab home. MD notified. Discharge instructions given.

## 2014-03-08 ENCOUNTER — Encounter: Payer: Self-pay | Admitting: Internal Medicine

## 2014-04-21 ENCOUNTER — Other Ambulatory Visit: Payer: Self-pay | Admitting: *Deleted

## 2014-04-21 NOTE — Telephone Encounter (Signed)
Refill request at [facility], will transfer

## 2014-05-08 ENCOUNTER — Other Ambulatory Visit: Payer: Self-pay | Admitting: Internal Medicine

## 2014-05-08 NOTE — Telephone Encounter (Signed)
The Rx said it was filled today so I am not refilling as pt needs appt ASAP as did not keep HFU appt.

## 2014-05-09 ENCOUNTER — Encounter: Payer: Self-pay | Admitting: Internal Medicine

## 2014-05-09 ENCOUNTER — Other Ambulatory Visit: Payer: Self-pay | Admitting: *Deleted

## 2014-05-09 MED ORDER — TERAZOSIN HCL 2 MG PO CAPS: 2.0000 mg | ORAL_CAPSULE | Freq: Every day | ORAL | Status: DC

## 2014-05-12 ENCOUNTER — Other Ambulatory Visit: Payer: Self-pay | Admitting: *Deleted

## 2014-06-06 ENCOUNTER — Other Ambulatory Visit: Payer: Self-pay | Admitting: Internal Medicine

## 2014-06-06 NOTE — Telephone Encounter (Signed)
Received this and am not at internal medicine anymore. You can decide if appropriate to fill through clinic.   Dr. Doug Sou

## 2014-06-06 NOTE — Telephone Encounter (Signed)
Sent to attending pool.

## 2014-06-27 ENCOUNTER — Encounter: Payer: Self-pay | Admitting: *Deleted

## 2014-07-01 ENCOUNTER — Encounter (HOSPITAL_COMMUNITY): Payer: Self-pay | Admitting: Emergency Medicine

## 2014-07-01 ENCOUNTER — Emergency Department (HOSPITAL_COMMUNITY): Payer: Medicare Other

## 2014-07-01 ENCOUNTER — Emergency Department (HOSPITAL_COMMUNITY)
Admission: EM | Admit: 2014-07-01 | Discharge: 2014-07-02 | Disposition: A | Discharge status: Home / Self Care | Payer: Medicare Other | Attending: Emergency Medicine | Admitting: Emergency Medicine

## 2014-07-01 DIAGNOSIS — I10 Essential (primary) hypertension: Secondary | ICD-10-CM | POA: Diagnosis not present

## 2014-07-01 DIAGNOSIS — N183 Chronic kidney disease, stage 3 (moderate): Secondary | ICD-10-CM | POA: Insufficient documentation

## 2014-07-01 DIAGNOSIS — Y9389 Activity, other specified: Secondary | ICD-10-CM | POA: Diagnosis not present

## 2014-07-01 DIAGNOSIS — I129 Hypertensive chronic kidney disease with stage 1 through stage 4 chronic kidney disease, or unspecified chronic kidney disease: Secondary | ICD-10-CM | POA: Insufficient documentation

## 2014-07-01 DIAGNOSIS — W1830XA Fall on same level, unspecified, initial encounter: Secondary | ICD-10-CM | POA: Insufficient documentation

## 2014-07-01 DIAGNOSIS — E785 Hyperlipidemia, unspecified: Secondary | ICD-10-CM | POA: Diagnosis not present

## 2014-07-01 DIAGNOSIS — Y92019 Unspecified place in single-family (private) house as the place of occurrence of the external cause: Secondary | ICD-10-CM | POA: Insufficient documentation

## 2014-07-01 DIAGNOSIS — W19XXXA Unspecified fall, initial encounter: Secondary | ICD-10-CM

## 2014-07-01 DIAGNOSIS — S299XXA Unspecified injury of thorax, initial encounter: Secondary | ICD-10-CM | POA: Diagnosis present

## 2014-07-01 DIAGNOSIS — F419 Anxiety disorder, unspecified: Secondary | ICD-10-CM | POA: Diagnosis not present

## 2014-07-01 DIAGNOSIS — E119 Type 2 diabetes mellitus without complications: Secondary | ICD-10-CM | POA: Insufficient documentation

## 2014-07-01 DIAGNOSIS — S298XXA Other specified injuries of thorax, initial encounter: Secondary | ICD-10-CM | POA: Diagnosis not present

## 2014-07-01 DIAGNOSIS — Z79899 Other long term (current) drug therapy: Secondary | ICD-10-CM | POA: Insufficient documentation

## 2014-07-01 DIAGNOSIS — S20211A Contusion of right front wall of thorax, initial encounter: Secondary | ICD-10-CM

## 2014-07-01 DIAGNOSIS — Z720 Tobacco use: Secondary | ICD-10-CM | POA: Diagnosis not present

## 2014-07-01 DIAGNOSIS — I6789 Other cerebrovascular disease: Secondary | ICD-10-CM | POA: Diagnosis not present

## 2014-07-01 DIAGNOSIS — Y92009 Unspecified place in unspecified non-institutional (private) residence as the place of occurrence of the external cause: Secondary | ICD-10-CM

## 2014-07-01 DIAGNOSIS — R0789 Other chest pain: Secondary | ICD-10-CM | POA: Diagnosis not present

## 2014-07-01 DIAGNOSIS — R4781 Slurred speech: Secondary | ICD-10-CM | POA: Diagnosis not present

## 2014-07-01 DIAGNOSIS — H9190 Unspecified hearing loss, unspecified ear: Secondary | ICD-10-CM | POA: Insufficient documentation

## 2014-07-01 NOTE — ED Notes (Signed)
Patient states he fell out of the bed and has pain on his left side

## 2014-07-01 NOTE — ED Notes (Signed)
Pt placed on BP Cuff and finger pulse oximetry per RN

## 2014-07-01 NOTE — ED Notes (Signed)
Per ems-- pt fell hitting shelf. Possible loc. Conscious a&ox4. Pt hard of hearing. Pt with tenderness to R chest. etoh on board. No head injury.

## 2014-07-02 DIAGNOSIS — S298XXA Other specified injuries of thorax, initial encounter: Secondary | ICD-10-CM | POA: Diagnosis not present

## 2014-07-02 DIAGNOSIS — R0789 Other chest pain: Secondary | ICD-10-CM | POA: Diagnosis not present

## 2014-07-02 NOTE — Discharge Instructions (Signed)
Chest Contusion A chest contusion is a deep bruise on your chest area. Contusions are the result of an injury that caused bleeding under the skin. A chest contusion may involve bruising of the skin, muscles, or ribs. The contusion may turn blue, purple, or yellow. Minor injuries will give you a painless contusion, but more severe contusions may stay painful and swollen for a few weeks. CAUSES  A contusion is usually caused by a blow, trauma, or direct force to an area of the body. SYMPTOMS   Swelling and redness of the injured area.  Discoloration of the injured area.  Tenderness and soreness of the injured area.  Pain. DIAGNOSIS  The diagnosis can be made by taking a history and performing a physical exam. An X-ray, CT scan, or MRI may be needed to determine if there were any associated injuries, such as broken bones (fractures) or internal injuries. TREATMENT  Often, the best treatment for a chest contusion is resting, icing, and applying cold compresses to the injured area. Deep breathing exercises may be recommended to reduce the risk of pneumonia. Over-the-counter medicines may also be recommended for pain control. HOME CARE INSTRUCTIONS   Put ice on the injured area.  Put ice in a plastic bag.  Place a towel between your skin and the bag.  Leave the ice on for 15-20 minutes, 03-04 times a day.  Only take over-the-counter or prescription medicines as directed by your caregiver. Your caregiver may recommend avoiding anti-inflammatory medicines (aspirin, ibuprofen, and naproxen) for 48 hours because these medicines may increase bruising.  Rest the injured area.  Perform deep-breathing exercises as directed by your caregiver.  Stop smoking if you smoke.  Do not lift objects over 5 pounds (2.3 kg) for 3 days or longer if recommended by your caregiver. SEEK IMMEDIATE MEDICAL CARE IF:   You have increased bruising or swelling.  You have pain that is getting worse.  You have  difficulty breathing.  You have dizziness, weakness, or fainting.  You have blood in your urine or stool.  You cough up or vomit blood.  Your swelling or pain is not relieved with medicines. MAKE SURE YOU:   Understand these instructions. Fall Prevention and Home Safety Falls cause injuries and can affect all age groups. It is possible to use preventive measures to significantly decrease the likelihood of falls. There are many simple measures which can make your home safer and prevent falls. OUTDOORS Repair cracks and edges of walkways and driveways. Remove high doorway thresholds. Trim shrubbery on the main path into your home. Have good outside lighting. Clear walkways of tools, rocks, debris, and clutter. Check that handrails are not broken and are securely fastened. Both sides of steps should have handrails. Have leaves, snow, and ice cleared regularly. Use sand or salt on walkways during winter months. In the garage, clean up grease or oil spills. BATHROOM Install night lights. Install grab bars by the toilet and in the tub and shower. Use non-skid mats or decals in the tub or shower. Place a plastic non-slip stool in the shower to sit on, if needed. Keep floors dry and clean up all water on the floor immediately. Remove soap buildup in the tub or shower on a regular basis. Secure bath mats with non-slip, double-sided rug tape. Remove throw rugs and tripping hazards from the floors. BEDROOMS Install night lights. Make sure a bedside light is easy to reach. Do not use oversized bedding. Keep a telephone by your bedside. Have a  firm chair with side arms to use for getting dressed. Remove throw rugs and tripping hazards from the floor. KITCHEN Keep handles on pots and pans turned toward the center of the stove. Use back burners when possible. Clean up spills quickly and allow time for drying. Avoid walking on wet floors. Avoid hot utensils and knives. Position shelves  so they are not too high or low. Place commonly used objects within easy reach. If necessary, use a sturdy step stool with a grab bar when reaching. Keep electrical cables out of the way. Do not use floor polish or wax that makes floors slippery. If you must use wax, use non-skid floor wax. Remove throw rugs and tripping hazards from the floor. STAIRWAYS Never leave objects on stairs. Place handrails on both sides of stairways and use them. Fix any loose handrails. Make sure handrails on both sides of the stairways are as long as the stairs. Check carpeting to make sure it is firmly attached along stairs. Make repairs to worn or loose carpet promptly. Avoid placing throw rugs at the top or bottom of stairways, or properly secure the rug with carpet tape to prevent slippage. Get rid of throw rugs, if possible. Have an electrician put in a light switch at the top and bottom of the stairs. OTHER FALL PREVENTION TIPS Wear low-heel or rubber-soled shoes that are supportive and fit well. Wear closed toe shoes. When using a stepladder, make sure it is fully opened and both spreaders are firmly locked. Do not climb a closed stepladder. Add color or contrast paint or tape to grab bars and handrails in your home. Place contrasting color strips on first and last steps. Learn and use mobility aids as needed. Install an electrical emergency response system. Turn on lights to avoid dark areas. Replace light bulbs that burn out immediately. Get light switches that glow. Arrange furniture to create clear pathways. Keep furniture in the same place. Firmly attach carpet with non-skid or double-sided tape. Eliminate uneven floor surfaces. Select a carpet pattern that does not visually hide the edge of steps. Be aware of all pets. OTHER HOME SAFETY TIPS Set the water temperature for 120 F (48.8 C). Keep emergency numbers on or near the telephone. Keep smoke detectors on every level of the home and near  sleeping areas. Document Released: 09/05/2002 Document Revised: 03/16/2012 Document Reviewed: 12/05/2011 [facility] Patient Information 2015 [place], [place]. This information is not intended to replace advice given to you by your health care provider. Make sure you discuss any questions you have with your health care provider.   Will watch your condition.  Will get help right away if you are not doing well or get worse. Document Released: 06/10/2001 Document Revised: 06/09/2012 Document Reviewed: 03/08/2012 [facility] Patient Information 2015 [place], [place]. This information is not intended to replace advice given to you by your health care provider. Make sure you discuss any questions you have with your health care provider.

## 2014-07-02 NOTE — ED Provider Notes (Signed)
CSN: [id number]     Arrival date & time 07/01/14  2245 History   First MD Initiated Contact with Patient 07/01/14 2340     Chief Complaint  Patient presents with  . Fall     (Consider location/radiation/quality/duration/timing/severity/associated sxs/prior Treatment) HPI 68 year old male presents to the emergency department from home via EMS after a fall.  Patient is very hard of hearing, and does not wish to answer many questions.  He reports that he fell, unclear how he fell.  He is complaining of right axillary chest wall pain.  He denies any shortness of breath.  He denies any abdominal pain.  No nausea or vomiting.  Unclear LOC.  He denies any head trauma. Past Medical History  Diagnosis Date  . Diabetes mellitus type II   . Hyperlipemia   . Anxiety   . Alcohol abuse   . HTN (hypertension)   . Cataract   . Alcohol withdrawal seizure   . Diabetes mellitus without complication   . Hypertension   . Hearing impairment   . Cataract   . HTN (hypertension), benign 02/26/2014  . Type II or unspecified type diabetes mellitus with neurological manifestations, not stated as uncontrolled 02/26/2014  . Chronic renal insufficiency, stage III (moderate) 02/26/2014  . Alcohol abuse 02/26/2014   History reviewed. No pertinent past surgical history. No family history on file. History  Substance Use Topics  . Smoking status: Current Every Day Smoker    Types: Cigars  . Smokeless tobacco: Not on file  . Alcohol Use: Yes     Comment: vodka--1/2 gal, heavy drinker x8 mos     Review of Systems  Unable to perform ROS: Other   patient hard of hearing     Allergies  Review of patient's allergies indicates no known allergies.  Home Medications   Prior to Admission medications   Medication Sig Start Date End Date Taking? Authorizing Provider  albuterol (PROVENTIL HFA;VENTOLIN HFA) 108 (90 BASE) MCG/ACT inhaler Inhale 2 puffs into the lungs every 6 (six) hours as needed for wheezing.  01/06/14   Marrion Coy, MD  FLUoxetine (PROZAC) 40 MG capsule Take 1 capsule (40 mg total) by mouth daily. 01/06/14   Marrion Coy, MD  FLUoxetine (PROZAC) 40 MG capsule Take 40 mg by mouth daily.    Historical Provider, MD  folic acid (FOLVITE) 1 MG tablet Take 1 tablet (1 mg total) by mouth daily. 02/28/14   Blain Pais, MD  OVER THE COUNTER MEDICATION Apply 1 application topically daily as needed (for eczema/dry skin).    Historical Provider, MD  OVER THE COUNTER MEDICATION Take 1 tablet by mouth daily as needed (pain). Arthritis pills    Historical Provider, MD  oxybutynin (DITROPAN) 5 MG tablet Take 1 tablet (5 mg total) by mouth daily. 01/18/14    Millers, MD  oxybutynin (DITROPAN) 5 MG tablet Take 5 mg by mouth 2 (two) times daily.    Historical Provider, MD  simvastatin (ZOCOR) 20 MG tablet Take 1 tablet (20 mg total) by mouth at bedtime. 01/06/14   Marrion Coy, MD  simvastatin (ZOCOR) 20 MG tablet Take 20 mg by mouth daily.    Historical Provider, MD  terazosin (HYTRIN) 2 MG capsule Take 1 capsule (2 mg total) by mouth at bedtime. 05/09/14   Madilyn Fireman, MD  thiamine (VITAMIN B-1) 100 MG tablet Take 1 tablet (100 mg total) by mouth daily. 02/28/14   Blain Pais, MD  valproate (DEPAKENE) 250 MG/5ML syrup Take 10  mLs (500 mg total) by mouth 3 (three) times daily. 02/28/14   Blain Pais, MD   BP 122/81  Pulse 96  Temp(Src) 98.5 F (36.9 C) (Oral)  Resp 18  Ht 5\' 10"  (1.778 m)  Wt 185 lb (83.915 kg)  BMI 26.54 kg/m2  SpO2 98% Physical Exam  Nursing note and vitals reviewed. Constitutional: He is oriented to person, place, and time. He appears well-developed and well-nourished.  Frail elderly gentleman, complaining of pain in right chest otherwise no acute distress  HENT:  Head: Normocephalic and atraumatic.  Nose: Nose normal.  Mouth/Throat: Oropharynx is clear and moist.  Eyes: Conjunctivae and EOM are normal. Pupils are equal,  round, and reactive to light.  Neck: Normal range of motion. Neck supple. No JVD present. No tracheal deviation present. No thyromegaly present.  Cardiovascular: Normal rate, regular rhythm, normal heart sounds and intact distal pulses.  Exam reveals no gallop and no friction rub.   No murmur heard. Pulmonary/Chest: Effort normal and breath sounds normal. No stridor. No respiratory distress. He has no wheezes. He has no rales. He exhibits tenderness (patient has tenderness to palpation of right mid axillary chest wall).  Abdominal: Soft. Bowel sounds are normal. He exhibits no distension and no mass. There is no tenderness. There is no rebound and no guarding.  Musculoskeletal: Normal range of motion. He exhibits no edema and no tenderness.  Lymphadenopathy:    He has no cervical adenopathy.  Neurological: He is alert and oriented to person, place, and time. He displays normal reflexes. He exhibits normal muscle tone. Coordination normal.  Skin: Skin is warm and dry. No rash noted. No erythema. No pallor.  Psychiatric:  Poor insight and judgment, patient is hostile and belligerent    ED Course  Procedures (including critical care time) Labs Review Labs Reviewed - No data to display  Imaging Review Dg Chest 2 View  07/02/2014   CLINICAL DATA:  Altered mental status. Possible loss of consciousness. Right-sided chest tenderness. Patient fell out of bed and states left-sided pain.  EXAM: CHEST  2 VIEW  COMPARISON:  05/09/2013  FINDINGS: Shallow inspiration. Heart size and pulmonary vascularity are normal for technique. Lungs are clear. Old left rib fractures. Multiple old vertebral compression deformities. No significant changes since previous study.  IMPRESSION: No evidence of active pulmonary disease.   Electronically Signed   By: Lucienne Capers M.D.   On: 07/02/2014 00:35     EKG Interpretation None      MDM   Final diagnoses:  Fall at home, initial encounter  Chest wall contusion,  right, initial encounter    12:36 AM Pt has been verbally abusive, refuses further intervention aside from chest xray.  Refuses head CT or lab work.  Pt does not appear to be incapacitated by alcohol or other substances.  Pt is able to express his interest in leaving.  No signs of head trauma.   And  Kalman Drape, MD 07/02/14 [phone]

## 2014-07-03 ENCOUNTER — Other Ambulatory Visit: Payer: Self-pay | Admitting: Internal Medicine

## 2014-07-05 ENCOUNTER — Other Ambulatory Visit: Payer: Self-pay | Admitting: Internal Medicine

## 2014-07-28 ENCOUNTER — Other Ambulatory Visit: Payer: Self-pay | Admitting: Internal Medicine

## 2014-07-31 ENCOUNTER — Other Ambulatory Visit: Payer: Self-pay | Admitting: *Deleted

## 2014-08-02 MED ORDER — VALPROATE SODIUM 250 MG/5ML PO SYRP: 500.0000 mg | ORAL_SOLUTION | Freq: Three times a day (TID) | ORAL | Status: DC

## 2014-08-02 NOTE — Telephone Encounter (Signed)
Hasn't been seen since Spring. Needs appt [facility] or PCP next 30 days for routine appt.

## 2014-08-02 NOTE — Telephone Encounter (Signed)
Message sent to front desk regarding appt as outline.

## 2014-08-04 ENCOUNTER — Encounter: Payer: Self-pay | Admitting: Internal Medicine

## 2014-10-26 ENCOUNTER — Encounter: Payer: Self-pay | Admitting: Internal Medicine

## 2014-12-29 ENCOUNTER — Other Ambulatory Visit: Payer: Self-pay | Admitting: *Deleted

## 2014-12-29 MED ORDER — OXYBUTYNIN CHLORIDE 5 MG PO TABS: 5.0000 mg | ORAL_TABLET | Freq: Every day | ORAL | Status: DC

## 2014-12-29 MED ORDER — TERAZOSIN HCL 2 MG PO CAPS: 2.0000 mg | ORAL_CAPSULE | Freq: Every day | ORAL | Status: DC

## 2014-12-29 NOTE — Telephone Encounter (Signed)
Ditropan listed 2 times with different directions.  Not sure which one is correct.

## 2015-01-12 ENCOUNTER — Other Ambulatory Visit: Payer: Self-pay | Admitting: *Deleted

## 2015-01-12 NOTE — Telephone Encounter (Signed)
Male calls and leaves message that pt needs some medicine and he's having seizures. Called back 3 times, hung up on twice, a lady states pt is having seizures and needs medicine, appt is also made for pt for Monday after she refuses for pt to go to ED

## 2015-01-13 MED ORDER — VALPROATE SODIUM 250 MG/5ML PO SYRP: 500.0000 mg | ORAL_SOLUTION | Freq: Three times a day (TID) | ORAL | Status: DC

## 2015-01-15 ENCOUNTER — Encounter: Payer: Medicare Other | Admitting: Internal Medicine

## 2015-02-12 ENCOUNTER — Other Ambulatory Visit: Payer: Self-pay | Admitting: *Deleted

## 2015-02-12 DIAGNOSIS — F32A Depression, unspecified: Secondary | ICD-10-CM

## 2015-02-12 DIAGNOSIS — F329 Major depressive disorder, single episode, unspecified: Secondary | ICD-10-CM

## 2015-02-16 ENCOUNTER — Emergency Department (HOSPITAL_COMMUNITY)
Admission: EM | Admit: 2015-02-16 | Discharge: 2015-02-16 | Disposition: A | Discharge status: Home / Self Care | Payer: Medicare Other | Attending: Emergency Medicine | Admitting: Emergency Medicine

## 2015-02-16 ENCOUNTER — Encounter (HOSPITAL_COMMUNITY): Payer: Self-pay

## 2015-02-16 DIAGNOSIS — F1010 Alcohol abuse, uncomplicated: Secondary | ICD-10-CM | POA: Diagnosis not present

## 2015-02-16 DIAGNOSIS — E1149 Type 2 diabetes mellitus with other diabetic neurological complication: Secondary | ICD-10-CM | POA: Insufficient documentation

## 2015-02-16 DIAGNOSIS — N183 Chronic kidney disease, stage 3 (moderate): Secondary | ICD-10-CM | POA: Insufficient documentation

## 2015-02-16 DIAGNOSIS — E785 Hyperlipidemia, unspecified: Secondary | ICD-10-CM | POA: Insufficient documentation

## 2015-02-16 DIAGNOSIS — H9190 Unspecified hearing loss, unspecified ear: Secondary | ICD-10-CM | POA: Insufficient documentation

## 2015-02-16 DIAGNOSIS — R531 Weakness: Secondary | ICD-10-CM

## 2015-02-16 DIAGNOSIS — Z720 Tobacco use: Secondary | ICD-10-CM | POA: Insufficient documentation

## 2015-02-16 DIAGNOSIS — H269 Unspecified cataract: Secondary | ICD-10-CM | POA: Diagnosis not present

## 2015-02-16 DIAGNOSIS — M6281 Muscle weakness (generalized): Secondary | ICD-10-CM | POA: Insufficient documentation

## 2015-02-16 DIAGNOSIS — F419 Anxiety disorder, unspecified: Secondary | ICD-10-CM | POA: Insufficient documentation

## 2015-02-16 DIAGNOSIS — Z79899 Other long term (current) drug therapy: Secondary | ICD-10-CM | POA: Insufficient documentation

## 2015-02-16 DIAGNOSIS — I129 Hypertensive chronic kidney disease with stage 1 through stage 4 chronic kidney disease, or unspecified chronic kidney disease: Secondary | ICD-10-CM | POA: Diagnosis not present

## 2015-02-16 DIAGNOSIS — R9431 Abnormal electrocardiogram [ECG] [EKG]: Secondary | ICD-10-CM | POA: Diagnosis not present

## 2015-02-16 LAB — URINALYSIS, ROUTINE W REFLEX MICROSCOPIC
Bilirubin Urine: NEGATIVE
Glucose, UA: 500 mg/dL — AB
HGB URINE DIPSTICK: NEGATIVE
Ketones, ur: NEGATIVE mg/dL
LEUKOCYTES UA: NEGATIVE
Nitrite: NEGATIVE
PH: 6 (ref 5.0–8.0)
Protein, ur: NEGATIVE mg/dL
SPECIFIC GRAVITY, URINE: 1.014 (ref 1.005–1.030)
UROBILINOGEN UA: 1 mg/dL (ref 0.0–1.0)

## 2015-02-16 LAB — CBC WITH DIFFERENTIAL/PLATELET
BASOS PCT: 0 % (ref 0–1)
Basophils Absolute: 0 10*3/uL (ref 0.0–0.1)
EOS ABS: 0.1 10*3/uL (ref 0.0–0.7)
EOS PCT: 1 % (ref 0–5)
HCT: 40 % (ref 39.0–52.0)
Hemoglobin: 12.7 g/dL — ABNORMAL LOW (ref 13.0–17.0)
Lymphocytes Relative: 10 % — ABNORMAL LOW (ref 12–46)
Lymphs Abs: 0.9 10*3/uL (ref 0.7–4.0)
MCH: 27.7 pg (ref 26.0–34.0)
MCHC: 31.8 g/dL (ref 30.0–36.0)
MCV: 87.3 fL (ref 78.0–100.0)
Monocytes Absolute: 0.6 10*3/uL (ref 0.1–1.0)
Monocytes Relative: 6 % (ref 3–12)
NEUTROS PCT: 83 % — AB (ref 43–77)
Neutro Abs: 7.8 10*3/uL — ABNORMAL HIGH (ref 1.7–7.7)
PLATELETS: 217 10*3/uL (ref 150–400)
RBC: 4.58 MIL/uL (ref 4.22–5.81)
RDW: 16.2 % — ABNORMAL HIGH (ref 11.5–15.5)
WBC: 9.3 10*3/uL (ref 4.0–10.5)

## 2015-02-16 LAB — COMPREHENSIVE METABOLIC PANEL
ALBUMIN: 4 g/dL (ref 3.5–5.0)
ALK PHOS: 92 U/L (ref 38–126)
ALT: 10 U/L — ABNORMAL LOW (ref 17–63)
AST: 16 U/L (ref 15–41)
Anion gap: 12 (ref 5–15)
BILIRUBIN TOTAL: 0.4 mg/dL (ref 0.3–1.2)
BUN: 19 mg/dL (ref 6–20)
CO2: 24 mmol/L (ref 22–32)
Calcium: 9.5 mg/dL (ref 8.9–10.3)
Chloride: 97 mmol/L — ABNORMAL LOW (ref 101–111)
Creatinine, Ser: 1.77 mg/dL — ABNORMAL HIGH (ref 0.61–1.24)
GFR calc Af Amer: 43 mL/min — ABNORMAL LOW (ref >60)
GFR calc non Af Amer: 37 mL/min — ABNORMAL LOW (ref >60)
GLUCOSE: 281 mg/dL — AB (ref 65–99)
POTASSIUM: 4.8 mmol/L (ref 3.5–5.1)
Sodium: 133 mmol/L — ABNORMAL LOW (ref 135–145)
Total Protein: 7.7 g/dL (ref 6.5–8.1)

## 2015-02-16 LAB — TROPONIN I: TROPONIN I: 0.03 ng/mL (ref <0.031)

## 2015-02-16 LAB — RAPID URINE DRUG SCREEN, HOSP PERFORMED
AMPHETAMINES: NOT DETECTED
BENZODIAZEPINES: NOT DETECTED
Barbiturates: NOT DETECTED
Cocaine: NOT DETECTED
Opiates: NOT DETECTED
Tetrahydrocannabinol: NOT DETECTED

## 2015-02-16 LAB — ETHANOL: Alcohol, Ethyl (B): <5 mg/dL (ref <5)

## 2015-02-16 MED ORDER — SODIUM CHLORIDE 0.9 % IV BOLUS (SEPSIS)
500.0000 mL | Freq: Once | INTRAVENOUS | Status: AC
  Administered 2015-02-16: 500 mL via INTRAVENOUS

## 2015-02-16 MED ORDER — VALPROATE SODIUM 250 MG/5ML PO SYRP: 500.0000 mg | ORAL_SOLUTION | Freq: Three times a day (TID) | ORAL | Status: DC

## 2015-02-16 NOTE — ED Notes (Signed)
[facility] called for transport.  

## 2015-02-16 NOTE — ED Notes (Addendum)
Pt presents from home via EMS with c/o weakness. Per EMS, pt is able to answer questions but refuses and says that we have the information on file. Per EMS, pt uses a wheelchair at baseline. Pt reports weakness for 3-4 days but also reporting he has been continuing to drink alcohol, reports he drank 3 24oz drinks today.

## 2015-02-16 NOTE — Discharge Instructions (Signed)
Call and make an appointment to follow-up with both your primary physician and the cardiologist. Return immediately for worsening symptoms or any concerns.  Alcohol Use Disorder Alcohol use disorder is a mental disorder. It is not a one-time incident of heavy drinking. Alcohol use disorder is the excessive and uncontrollable use of alcohol over time that leads to problems with functioning in one or more areas of daily living. People with this disorder risk harming themselves and others when they drink to excess. Alcohol use disorder also can cause other mental disorders, such as mood and anxiety disorders, and serious physical problems. People with alcohol use disorder often misuse other drugs.  Alcohol use disorder is common and widespread. Some people with this disorder drink alcohol to cope with or escape from negative life events. Others drink to relieve chronic pain or symptoms of mental illness. People with a family history of alcohol use disorder are at higher risk of losing control and using alcohol to excess.  SYMPTOMS  Signs and symptoms of alcohol use disorder may include the following:   Consumption ofalcohol inlarger amounts or over a longer period of time than intended.  Multiple unsuccessful attempts to cutdown or control alcohol use.   A great deal of time spent obtaining alcohol, using alcohol, or recovering from the effects of alcohol (hangover).  A strong desire or urge to use alcohol (cravings).   Continued use of alcohol despite problems at work, school, or home because of alcohol use.   Continued use of alcohol despite problems in relationships because of alcohol use.  Continued use of alcohol in situations when it is physically hazardous, such as driving a car.  Continued use of alcohol despite awareness of a physical or psychological problem that is likely related to alcohol use. Physical problems related to alcohol use can involve the brain, heart, liver, stomach,  and intestines. Psychological problems related to alcohol use include intoxication, depression, anxiety, psychosis, delirium, and dementia.   The need for increased amounts of alcohol to achieve the same desired effect, or a decreased effect from the consumption of the same amount of alcohol (tolerance).  Withdrawal symptoms upon reducing or stopping alcohol use, or alcohol use to reduce or avoid withdrawal symptoms. Withdrawal symptoms include:  Racing heart.  Hand tremor.  Difficulty sleeping.  Nausea.  Vomiting.  Hallucinations.  Restlessness.  Seizures. DIAGNOSIS Alcohol use disorder is diagnosed through an assessment by your health care provider. Your health care provider may start by asking three or four questions to screen for excessive or problematic alcohol use. To confirm a diagnosis of alcohol use disorder, at least two symptoms must be present within a 59-month period. The severity of alcohol use disorder depends on the number of symptoms:  Mild--two or three.  Moderate--four or five.  Severe--six or more. Your health care provider may perform a physical exam or use results from lab tests to see if you have physical problems resulting from alcohol use. Your health care provider may refer you to a mental health professional for evaluation. TREATMENT  Some people with alcohol use disorder are able to reduce their alcohol use to low-risk levels. Some people with alcohol use disorder need to quit drinking alcohol. When necessary, mental health professionals with specialized training in substance use treatment can help. Your health care provider can help you decide how severe your alcohol use disorder is and what type of treatment you need. The following forms of treatment are available:   Detoxification. Detoxification involves the use  of prescription medicines to prevent alcohol withdrawal symptoms in the first week after quitting. This is important for people with a  history of symptoms of withdrawal and for heavy drinkers who are likely to have withdrawal symptoms. Alcohol withdrawal can be dangerous and, in severe cases, cause death. Detoxification is usually provided in a hospital or in-patient substance use treatment facility.  Counseling or talk therapy. Talk therapy is provided by substance use treatment counselors. It addresses the reasons people use alcohol and ways to keep them from drinking again. The goals of talk therapy are to help people with alcohol use disorder find healthy activities and ways to cope with life stress, to identify and avoid triggers for alcohol use, and to handle cravings, which can cause relapse.  Medicines.Different medicines can help treat alcohol use disorder through the following actions:  Decrease alcohol cravings.  Decrease the positive reward response felt from alcohol use.  Produce an uncomfortable physical reaction when alcohol is used (aversion therapy).  Support groups. Support groups are run by people who have quit drinking. They provide emotional support, advice, and guidance. These forms of treatment are often combined. Some people with alcohol use disorder benefit from intensive combination treatment provided by specialized substance use treatment centers. Both inpatient and outpatient treatment programs are available. Document Released: 10/23/2004 Document Revised: 01/30/2014 Document Reviewed: 12/23/2012 [facility] Patient Information 2015 [place], [place]. This information is not intended to replace advice given to you by your health care provider. Make sure you discuss any questions you have with your health care provider.  Weakness Weakness is a lack of strength. It may be felt all over the body (generalized) or in one specific part of the body (focal). Some causes of weakness can be serious. You may need further medical evaluation, especially if you are elderly or you have a history of immunosuppression (such  as chemotherapy or HIV), kidney disease, heart disease, or diabetes. CAUSES  Weakness can be caused by many different things, including:  Infection.  Physical exhaustion.  Internal bleeding or other blood loss that results in a lack of red blood cells (anemia).  Dehydration. This cause is more common in elderly people.  Side effects or electrolyte abnormalities from medicines, such as pain medicines or sedatives.  Emotional distress, anxiety, or depression.  Circulation problems, especially severe peripheral arterial disease.  Heart disease, such as rapid atrial fibrillation, bradycardia, or heart failure.  Nervous system disorders, such as Guillain-Barr syndrome, multiple sclerosis, or stroke. DIAGNOSIS  To find the cause of your weakness, your caregiver will take your history and perform a physical exam. Lab tests or X-rays may also be ordered, if needed. TREATMENT  Treatment of weakness depends on the cause of your symptoms and can vary greatly. HOME CARE INSTRUCTIONS   Rest as needed.  Eat a well-balanced diet.  Try to get some exercise every day.  Only take over-the-counter or prescription medicines as directed by your caregiver. SEEK MEDICAL CARE IF:   Your weakness seems to be getting worse or spreads to other parts of your body.  You develop new aches or pains. SEEK IMMEDIATE MEDICAL CARE IF:   You cannot perform your normal daily activities, such as getting dressed and feeding yourself.  You cannot walk up and down stairs, or you feel exhausted when you do so.  You have shortness of breath or chest pain.  You have difficulty moving parts of your body.  You have weakness in only one area of the body or on only  one side of the body.  You have a fever.  You have trouble speaking or swallowing.  You cannot control your bladder or bowel movements.  You have black or bloody vomit or stools. MAKE SURE YOU:  Understand these instructions.  Will watch  your condition.  Will get help right away if you are not doing well or get worse. Document Released: 09/15/2005 Document Revised: 03/16/2012 Document Reviewed: 11/14/2011 [facility] Patient Information 2015 [place], [place]. This information is not intended to replace advice given to you by your health care provider. Make sure you discuss any questions you have with your health care provider.

## 2015-02-16 NOTE — ED Notes (Signed)
Bed: WA13 Expected date:  Expected time:  Means of arrival:  Comments: weakness 

## 2015-02-16 NOTE — ED Provider Notes (Signed)
CSN: [id number]     Arrival date & time 02/16/15  0209 History   First MD Initiated Contact with Patient 02/16/15 0226     Chief Complaint  Patient presents with  . Weakness     (Consider location/radiation/quality/duration/timing/severity/associated sxs/prior Treatment) HPI Patient presents by EMS for complaint of generalized weakness. States he ran out of his medications several days ago and has had persistent weakness. Denies numbness. No nausea or vomiting. Patient's alcoholic and states he's continued to drink. When asked if the patient has any pain he states he has pain all over his body. Does not acknowledge any specific pain including chest pain, abdominal pain, back pain or neck pain. Past Medical History  Diagnosis Date  . Diabetes mellitus type II   . Hyperlipemia   . Anxiety   . Alcohol abuse   . HTN (hypertension)   . Cataract   . Alcohol withdrawal seizure   . Diabetes mellitus without complication   . Hypertension   . Hearing impairment   . Cataract   . HTN (hypertension), benign 02/26/2014  . Type II or unspecified type diabetes mellitus with neurological manifestations, not stated as uncontrolled 02/26/2014  . Chronic renal insufficiency, stage III (moderate) 02/26/2014  . Alcohol abuse 02/26/2014   History reviewed. No pertinent past surgical history. No family history on file. History  Substance Use Topics  . Smoking status: Current Every Day Smoker    Types: Cigars  . Smokeless tobacco: Not on file  . Alcohol Use: Yes     Comment: vodka--1/2 gal, heavy drinker x8 mos     Review of Systems  Constitutional: Positive for fatigue. Negative for fever and chills.  Respiratory: Negative for shortness of breath.   Cardiovascular: Negative for chest pain.  Gastrointestinal: Negative for nausea, vomiting and abdominal pain.  Musculoskeletal: Positive for myalgias. Negative for back pain, neck pain and neck stiffness.  Skin: Positive for rash. Negative for wound.   Neurological: Positive for weakness (generalized). Negative for dizziness, syncope, light-headedness, numbness and headaches.      Allergies  Review of patient's allergies indicates no known allergies.  Home Medications   Prior to Admission medications   Medication Sig Start Date End Date Taking? Authorizing Provider  acetaminophen (TYLENOL) 500 MG tablet Take 500 mg by mouth every 8 (eight) hours as needed for moderate pain.   Yes Historical Provider, MD  albuterol (PROVENTIL HFA;VENTOLIN HFA) 108 (90 BASE) MCG/ACT inhaler Inhale 2 puffs into the lungs every 6 (six) hours as needed for wheezing. 01/06/14  Yes Ricke Hey, MD  FLUoxetine (PROZAC) 40 MG capsule Take 1 capsule (40 mg total) by mouth daily. 01/06/14  Yes Ricke Hey, MD  OVER THE COUNTER MEDICATION Take 1 tablet by mouth daily as needed (pain). Arthritis pills   Yes Historical Provider, MD  oxybutynin (DITROPAN) 5 MG tablet Take 1 tablet (5 mg total) by mouth daily. 12/29/14  Yes Karlene Einstein, MD  terazosin (HYTRIN) 2 MG capsule Take 1 capsule (2 mg total) by mouth at bedtime. 12/29/14  Yes Karlene Einstein, MD  valproate (DEPAKENE) 250 MG/5ML syrup Take 10 mLs (500 mg total) by mouth 3 (three) times daily. 01/13/15  Yes Karlene Einstein, MD  folic acid (FOLVITE) 1 MG tablet Take 1 tablet (1 mg total) by mouth daily. Patient not taking: Reported on 02/16/2015 02/28/14   Blain Pais, MD  simvastatin (ZOCOR) 20 MG tablet Take 1 tablet (20 mg total) by mouth at bedtime. Patient not  taking: Reported on 02/16/2015 01/06/14   Ricke Hey, MD  thiamine (VITAMIN B-1) 100 MG tablet Take 1 tablet (100 mg total) by mouth daily. Patient not taking: Reported on 02/16/2015 02/28/14   Blain Pais, MD   BP 148/83 mmHg  Pulse 71  Temp(Src) 98.1 F (36.7 C) (Oral)  Resp 15  SpO2 100% Physical Exam  Constitutional: He is oriented to person, place, and time. He appears well-developed and well-nourished.  No distress.  Chronically ill-appearing  HENT:  Head: Normocephalic and atraumatic.  Dry mucous membranes. No intraoral injury.  Eyes: EOM are normal. Pupils are equal, round, and reactive to light.  Neck: Normal range of motion. Neck supple.  No posterior midline cervical tenderness to palpation.  Cardiovascular: Normal rate and regular rhythm.  Exam reveals no gallop and no friction rub.   No murmur heard. Pulmonary/Chest: Effort normal and breath sounds normal. No respiratory distress. He has no wheezes. He has no rales.  Abdominal: Soft. Bowel sounds are normal. He exhibits no distension and no mass. There is no tenderness. There is no rebound and no guarding.  Musculoskeletal: Normal range of motion. He exhibits no edema or tenderness.  Extremities without pain. No lower extremity swelling or pain. No midline thoracic or lumbar tenderness. Patient does have mild tenderness over the right trapezius  Neurological: He is alert and oriented to person, place, and time.  5/5 motor in all extremities. Sensation grossly intact.  Skin: Skin is warm and dry. Rash noted. No erythema.  Psychiatric: He has a normal mood and affect. His behavior is normal.  Nursing note and vitals reviewed.   ED Course  Procedures (including critical care time) Labs Review Labs Reviewed  CBC WITH DIFFERENTIAL/PLATELET - Abnormal; Notable for the following:    Hemoglobin 12.7 (*)    RDW 16.2 (*)    Neutrophils Relative % 83 (*)    Neutro Abs 7.8 (*)    Lymphocytes Relative 10 (*)    All other components within normal limits  COMPREHENSIVE METABOLIC PANEL - Abnormal; Notable for the following:    Sodium 133 (*)    Chloride 97 (*)    Glucose, Bld 281 (*)    Creatinine, Ser 1.77 (*)    ALT 10 (*)    GFR calc non Af Amer 37 (*)    GFR calc Af Amer 43 (*)    All other components within normal limits  URINALYSIS, ROUTINE W REFLEX MICROSCOPIC - Abnormal; Notable for the following:    Glucose, UA 500 (*)     All other components within normal limits  TROPONIN I  ETHANOL  URINE RAPID DRUG SCREEN (HOSP PERFORMED)    Imaging Review No results found.   EKG Interpretation   Date/Time:  Friday Feb 16 2015 02:28:22 EDT Ventricular Rate:  72 PR Interval:  156 QRS Duration: 85 QT Interval:  398 QTC Calculation: 435 R Axis:   74 Text Interpretation:  Sinus rhythm Nonspecific T abnormalities, lateral  leads Minimal ST elevation, anterior leads Baseline wander in lead(s) V1  Confirmed by Lita Mains  MD, Skyy Mcknight ([id number]) on 02/16/2015 6:34:59 AM      MDM   Final diagnoses:  Generalized weakness  ETOH abuse  Abnormal EKG   Patient with patient with vague generalized weakness. Left or workup without any acute findings. Patient does appear to have new T-wave inversions compared to previous EKG. Normal troponin. Denies chest pain. Discussed with internal medicine resident on call. We'll arrange follow-up appointment early next  week. Patient given return precautions as well as understanding.     Julianne Rice, MD 02/16/15 [phone]

## 2015-02-19 ENCOUNTER — Other Ambulatory Visit: Payer: Self-pay | Admitting: Internal Medicine

## 2015-02-20 ENCOUNTER — Emergency Department (HOSPITAL_COMMUNITY): Payer: Medicare Other

## 2015-02-20 ENCOUNTER — Encounter (HOSPITAL_COMMUNITY): Payer: Self-pay | Admitting: Emergency Medicine

## 2015-02-20 ENCOUNTER — Emergency Department (HOSPITAL_COMMUNITY)
Admission: EM | Admit: 2015-02-20 | Discharge: 2015-02-21 | Disposition: A | Discharge status: Home / Self Care | Payer: Medicare Other | Attending: Emergency Medicine | Admitting: Emergency Medicine

## 2015-02-20 DIAGNOSIS — Z720 Tobacco use: Secondary | ICD-10-CM | POA: Insufficient documentation

## 2015-02-20 DIAGNOSIS — R251 Tremor, unspecified: Secondary | ICD-10-CM | POA: Diagnosis not present

## 2015-02-20 DIAGNOSIS — Z043 Encounter for examination and observation following other accident: Secondary | ICD-10-CM | POA: Insufficient documentation

## 2015-02-20 DIAGNOSIS — F1010 Alcohol abuse, uncomplicated: Secondary | ICD-10-CM | POA: Diagnosis present

## 2015-02-20 DIAGNOSIS — Z79899 Other long term (current) drug therapy: Secondary | ICD-10-CM | POA: Insufficient documentation

## 2015-02-20 DIAGNOSIS — Y9389 Activity, other specified: Secondary | ICD-10-CM | POA: Diagnosis not present

## 2015-02-20 DIAGNOSIS — E1149 Type 2 diabetes mellitus with other diabetic neurological complication: Secondary | ICD-10-CM | POA: Insufficient documentation

## 2015-02-20 DIAGNOSIS — K140 Glossitis: Secondary | ICD-10-CM | POA: Diagnosis not present

## 2015-02-20 DIAGNOSIS — R51 Headache: Secondary | ICD-10-CM | POA: Insufficient documentation

## 2015-02-20 DIAGNOSIS — Y998 Other external cause status: Secondary | ICD-10-CM | POA: Diagnosis not present

## 2015-02-20 DIAGNOSIS — L989 Disorder of the skin and subcutaneous tissue, unspecified: Secondary | ICD-10-CM | POA: Diagnosis not present

## 2015-02-20 DIAGNOSIS — F1020 Alcohol dependence, uncomplicated: Secondary | ICD-10-CM | POA: Diagnosis not present

## 2015-02-20 DIAGNOSIS — N183 Chronic kidney disease, stage 3 (moderate): Secondary | ICD-10-CM | POA: Insufficient documentation

## 2015-02-20 DIAGNOSIS — G319 Degenerative disease of nervous system, unspecified: Secondary | ICD-10-CM | POA: Diagnosis not present

## 2015-02-20 DIAGNOSIS — Y9289 Other specified places as the place of occurrence of the external cause: Secondary | ICD-10-CM | POA: Diagnosis not present

## 2015-02-20 DIAGNOSIS — I129 Hypertensive chronic kidney disease with stage 1 through stage 4 chronic kidney disease, or unspecified chronic kidney disease: Secondary | ICD-10-CM | POA: Insufficient documentation

## 2015-02-20 DIAGNOSIS — F419 Anxiety disorder, unspecified: Secondary | ICD-10-CM | POA: Insufficient documentation

## 2015-02-20 DIAGNOSIS — H269 Unspecified cataract: Secondary | ICD-10-CM | POA: Insufficient documentation

## 2015-02-20 DIAGNOSIS — E785 Hyperlipidemia, unspecified: Secondary | ICD-10-CM | POA: Insufficient documentation

## 2015-02-20 DIAGNOSIS — W1839XA Other fall on same level, initial encounter: Secondary | ICD-10-CM | POA: Diagnosis not present

## 2015-02-20 DIAGNOSIS — H9190 Unspecified hearing loss, unspecified ear: Secondary | ICD-10-CM | POA: Insufficient documentation

## 2015-02-20 DIAGNOSIS — M7981 Nontraumatic hematoma of soft tissue: Secondary | ICD-10-CM | POA: Diagnosis not present

## 2015-02-20 DIAGNOSIS — W19XXXA Unspecified fall, initial encounter: Secondary | ICD-10-CM

## 2015-02-20 DIAGNOSIS — I6789 Other cerebrovascular disease: Secondary | ICD-10-CM | POA: Diagnosis not present

## 2015-02-20 LAB — CBC WITH DIFFERENTIAL/PLATELET
BASOS PCT: 1 % (ref 0–1)
Basophils Absolute: 0 10*3/uL (ref 0.0–0.1)
Eosinophils Absolute: 0.2 10*3/uL (ref 0.0–0.7)
Eosinophils Relative: 2 % (ref 0–5)
HCT: 37.5 % — ABNORMAL LOW (ref 39.0–52.0)
Hemoglobin: 11.9 g/dL — ABNORMAL LOW (ref 13.0–17.0)
Lymphocytes Relative: 14 % (ref 12–46)
Lymphs Abs: 1.2 10*3/uL (ref 0.7–4.0)
MCH: 27.6 pg (ref 26.0–34.0)
MCHC: 31.7 g/dL (ref 30.0–36.0)
MCV: 87 fL (ref 78.0–100.0)
MONO ABS: 0.6 10*3/uL (ref 0.1–1.0)
MONOS PCT: 7 % (ref 3–12)
NEUTROS PCT: 76 % (ref 43–77)
Neutro Abs: 6.3 10*3/uL (ref 1.7–7.7)
PLATELETS: 228 10*3/uL (ref 150–400)
RBC: 4.31 MIL/uL (ref 4.22–5.81)
RDW: 15.8 % — ABNORMAL HIGH (ref 11.5–15.5)
WBC: 8.3 10*3/uL (ref 4.0–10.5)

## 2015-02-20 LAB — COMPREHENSIVE METABOLIC PANEL
ALBUMIN: 3.7 g/dL (ref 3.5–5.0)
ALT: 11 U/L — ABNORMAL LOW (ref 17–63)
AST: 13 U/L — ABNORMAL LOW (ref 15–41)
Alkaline Phosphatase: 102 U/L (ref 38–126)
Anion gap: 7 (ref 5–15)
BUN: 17 mg/dL (ref 6–20)
CO2: 24 mmol/L (ref 22–32)
Calcium: 9 mg/dL (ref 8.9–10.3)
Chloride: 105 mmol/L (ref 101–111)
Creatinine, Ser: 1.41 mg/dL — ABNORMAL HIGH (ref 0.61–1.24)
GFR calc Af Amer: 56 mL/min — ABNORMAL LOW (ref >60)
GFR, EST NON AFRICAN AMERICAN: 49 mL/min — AB (ref >60)
Glucose, Bld: 153 mg/dL — ABNORMAL HIGH (ref 65–99)
POTASSIUM: 4.6 mmol/L (ref 3.5–5.1)
Sodium: 136 mmol/L (ref 135–145)
Total Bilirubin: 0.4 mg/dL (ref 0.3–1.2)
Total Protein: 6.7 g/dL (ref 6.5–8.1)

## 2015-02-20 LAB — URINE MICROSCOPIC-ADD ON

## 2015-02-20 LAB — URINALYSIS, ROUTINE W REFLEX MICROSCOPIC
Bilirubin Urine: NEGATIVE
GLUCOSE, UA: 250 mg/dL — AB
Ketones, ur: NEGATIVE mg/dL
Leukocytes, UA: NEGATIVE
NITRITE: NEGATIVE
Protein, ur: NEGATIVE mg/dL
Specific Gravity, Urine: 1.013 (ref 1.005–1.030)
UROBILINOGEN UA: 1 mg/dL (ref 0.0–1.0)
pH: 5 (ref 5.0–8.0)

## 2015-02-20 LAB — ETHANOL

## 2015-02-20 LAB — LIPASE, BLOOD: Lipase: 14 U/L — ABNORMAL LOW (ref 22–51)

## 2015-02-20 MED ORDER — ACETAMINOPHEN 500 MG PO TABS
1000.0000 mg | ORAL_TABLET | Freq: Once | ORAL | Status: AC
  Administered 2015-02-20: 1000 mg via ORAL
  Filled 2015-02-20: qty 2

## 2015-02-20 MED ORDER — THIAMINE HCL 100 MG/ML IJ SOLN
Freq: Once | INTRAVENOUS | Status: AC
  Administered 2015-02-20: 21:00:00 via INTRAVENOUS
  Filled 2015-02-20: qty 1000

## 2015-02-20 NOTE — Progress Notes (Signed)
CSW was informed by nurse that EMS states the pt is living in poor home conditions due to cleanliness. Per note, EMS reports home was unsanitary and in very poor condition.  CSW met with pt at bedside. There was no family present. Patient confirms that he is from home. Patient states that he lives at home in [place] with his step daughter Linn. He states that his step daughter is unemployed and at home with him at all times. Also, pt informed CSW that his first cousin(Nancy) visits him x2 a week and his neighbor visits him every other day.  The pt stated " Some days I feel good and some days I cant." when referring to cleaning his home.  Patient informed CSW that he has a heart attack 5 Years ago.  CSW will inform APS about possible poor living conditions within he patient's home.  Willette Brace [phone] ED CSW 02/20/2015 9:26 PM

## 2015-02-20 NOTE — ED Provider Notes (Signed)
CSN: [id number]     Arrival date & time 02/20/15  1743 History   First MD Initiated Contact with Patient 02/20/15 1912     Chief Complaint  Patient presents with  . Alcohol Problem    pt is daily drinker and has not had anything to drink or his regular medications today  . Tremors     (Consider location/radiation/quality/duration/timing/severity/associated sxs/prior Treatment) HPI The patient reports he fell on the way to the bathroom. He reports he does have difficulty walking and problems with his balance. He describes this as a chronic problem. He reports he lives with a family member who is "like a daughter to him". Someone that he has helped raise since childhood. He reports that she helps him in his home. Patient reports his last alcohol was yesterday evening. He reports when he stops streaking he does get "the shakes" he denies he's had fever or recent illness. Past Medical History  Diagnosis Date  . Diabetes mellitus type II   . Hyperlipemia   . Anxiety   . Alcohol abuse   . HTN (hypertension)   . Cataract   . Alcohol withdrawal seizure   . Diabetes mellitus without complication   . Hypertension   . Hearing impairment   . Cataract   . HTN (hypertension), benign 02/26/2014  . Type II or unspecified type diabetes mellitus with neurological manifestations, not stated as uncontrolled 02/26/2014  . Chronic renal insufficiency, stage III (moderate) 02/26/2014  . Alcohol abuse 02/26/2014   History reviewed. No pertinent past surgical history. No family history on file. History  Substance Use Topics  . Smoking status: Current Every Day Smoker    Types: Cigars  . Smokeless tobacco: Not on file  . Alcohol Use: Yes     Comment: vodka--1/2 gal, heavy drinker x8 mos     Review of Systems  10 Systems reviewed and are negative for acute change except as noted in the HPI.   Allergies  Review of patient's allergies indicates no known allergies.  Home Medications   Prior to  Admission medications   Medication Sig Start Date End Date Taking? Authorizing Provider  acetaminophen (TYLENOL) 500 MG tablet Take 500 mg by mouth every 8 (eight) hours as needed for moderate pain.    Historical Provider, MD  albuterol (PROVENTIL HFA;VENTOLIN HFA) 108 (90 BASE) MCG/ACT inhaler Inhale 2 puffs into the lungs every 6 (six) hours as needed for wheezing. 01/06/14   Ricke Hey, MD  FLUoxetine (PROZAC) 40 MG capsule Take 1 capsule (40 mg total) by mouth daily. 01/06/14   Ricke Hey, MD  folic acid (FOLVITE) 1 MG tablet Take 1 tablet (1 mg total) by mouth daily. Patient not taking: Reported on 02/16/2015 02/28/14   Blain Pais, MD  OVER THE COUNTER MEDICATION Take 1 tablet by mouth daily as needed (pain). Arthritis pills    Historical Provider, MD  oxybutynin (DITROPAN) 5 MG tablet Take 1 tablet (5 mg total) by mouth daily. 12/29/14   Karlene Einstein, MD  simvastatin (ZOCOR) 20 MG tablet Take 1 tablet (20 mg total) by mouth at bedtime. Patient not taking: Reported on 02/16/2015 01/06/14   Ricke Hey, MD  terazosin (HYTRIN) 2 MG capsule Take 1 capsule (2 mg total) by mouth at bedtime. 12/29/14   Karlene Einstein, MD  thiamine (VITAMIN B-1) 100 MG tablet Take 1 tablet (100 mg total) by mouth daily. Patient not taking: Reported on 02/16/2015 02/28/14   Blain Pais, MD  Valproic Acid (DEPAKENE) 250 MG/5ML SYRP syrup Take 10 mLs (500 mg total) by mouth 3 (three) times daily. 02/21/15   Karlene Einstein, MD   BP 138/68 mmHg  Pulse 79  Temp(Src) 98.7 F (37.1 C) (Oral)  Resp 18  SpO2 100% Physical Exam  Constitutional: He is oriented to person, place, and time.  The patient is alert and in no distress. He is cheerful and interactive.   HENT:  Head: Normocephalic and atraumatic.  Right Ear: External ear normal.  Left Ear: External ear normal.  Nose: Nose normal.  Patient has small excoriations on the top of the scalp which appear consistent with  picking it minor lesions. There are no other areas of contusion or hematoma. Patient has glossitis.  Eyes: EOM are normal. Pupils are equal, round, and reactive to light.  Neck: Neck supple.  Cardiovascular: Normal rate, regular rhythm, normal heart sounds and intact distal pulses.   Pulmonary/Chest: Effort normal and breath sounds normal. No respiratory distress.  Abdominal: Soft. Bowel sounds are normal. He exhibits no distension. There is no tenderness.  Musculoskeletal: Normal range of motion. He exhibits no edema or tenderness.  Patient's lower extremities are not significantly atrophic. He has mild deconditioning.  Neurological: He is alert and oriented to person, place, and time. He has normal strength. No cranial nerve deficit. He exhibits normal muscle tone. Coordination normal. GCS eye subscore is 4. GCS verbal subscore is 5. GCS motor subscore is 6.  Skin: Skin is warm, dry and intact.  Patient has bruises of various ages on the lower extremities. None are significantly large or indurated.  Psychiatric: He has a normal mood and affect.    ED Course  Procedures (including critical care time) Labs Review Labs Reviewed  COMPREHENSIVE METABOLIC PANEL - Abnormal; Notable for the following:    Glucose, Bld 153 (*)    Creatinine, Ser 1.41 (*)    AST 13 (*)    ALT 11 (*)    GFR calc non Af Amer 49 (*)    GFR calc Af Amer 56 (*)    All other components within normal limits  LIPASE, BLOOD - Abnormal; Notable for the following:    Lipase 14 (*)    All other components within normal limits  CBC WITH DIFFERENTIAL/PLATELET - Abnormal; Notable for the following:    Hemoglobin 11.9 (*)    HCT 37.5 (*)    RDW 15.8 (*)    All other components within normal limits  URINALYSIS, ROUTINE W REFLEX MICROSCOPIC - Abnormal; Notable for the following:    Glucose, UA 250 (*)    Hgb urine dipstick TRACE (*)    All other components within normal limits  URINE MICROSCOPIC-ADD ON - Abnormal; Notable  for the following:    Casts HYALINE CASTS (*)    All other components within normal limits  ETHANOL    Imaging Review Ct Head Wo Contrast  02/20/2015   CLINICAL DATA:  Patient states non taking medications for 1 month. Heavy drinking. Headaches.  EXAM: CT HEAD WITHOUT CONTRAST  TECHNIQUE: Contiguous axial images were obtained from the base of the skull through the vertex without intravenous contrast.  COMPARISON:  05/09/2013 and 09/02/2012  FINDINGS: Ventricles and cisterns are within normal. There is mild age related atrophic change which is stable. There is chronic ischemic microvascular disease. There is no mass, mass effect, shift of midline structures or acute hemorrhage. No evidence of acute infarction. Complete opacification of the right maxillary sinus unchanged. Findings  suggesting previous nasal bone fracture. Nasal septal deviation to the right unchanged.  IMPRESSION: No acute intracranial findings.  Mild age related atrophy and chronic ischemic microvascular disease unchanged.  Stable complete opacification of the right maxillary sinus.   Electronically Signed   By: Marin Olp M.D.   On: 02/20/2015 21:28     EKG Interpretation None      MDM   Final diagnoses:  Uncomplicated alcohol dependence  Fall, initial encounter   At this and do not appear to be acute locations of the patient's underlying chronic problems. He is a chronic alcoholic however at this point is not in acute withdrawal. Eitel signs are stable and there is no evidence of mental status change. He describes his gait problems is chronic in nature. Diagnostic state is not show immediate electrolyte or infectious complications.    Charlesetta Shanks, MD 02/22/15 1620

## 2015-02-20 NOTE — ED Notes (Signed)
Bed: [id number] Expected date:  Expected time:  Means of arrival:  Comments: EMS- ETOH Withdrawl

## 2015-02-20 NOTE — ED Notes (Addendum)
PER EMS - pt A+Ox4, from home with c/o ETOH withdrawal and tremors today.  Pt is a daily drinker and has not had anything to drink or taken his daily medications today.  EMS reports home was unsanitary and in very poor condition, pt does not appear to be able to care for himself, wearing the same soiled clothes, unable to open his pill bottles.  EMS reports a family member is supposed to help him but that does not appear to be happening.

## 2015-02-20 NOTE — ED Notes (Signed)
Social work at bedside.  

## 2015-02-20 NOTE — ED Notes (Signed)
Initial Contact - pt awake, alert, very HOH.  Pt reports has not been taking his medications x1 mo "because I'm too stubborn to ask for help", however pt later reports his "stepdaughter helps me out".  Pt reports drinking every day and last drink was last night "when my neighbor was having a party".  Pt c/o waking up this AM "and having the shakes".  Pt also c/o chronic arm/shoulder/back pain.  Pt is very disheveled, wearing clothes soaked entirely with urine.  Pt asked about changing his clothes at home and sts "i have trouble and can't walk because of my legs", but is unable to further elaborate.  MAEI, +csm/+pulses.  Skin PWD.  Speaking full/clear sentences, rr even/un-lab.  Pt cleaned and changed to hospital gown.  NAD.

## 2015-02-20 NOTE — Discharge Instructions (Signed)
Alcohol Use Disorder Alcohol use disorder is a mental disorder. It is not a one-time incident of heavy drinking. Alcohol use disorder is the excessive and uncontrollable use of alcohol over time that leads to problems with functioning in one or more areas of daily living. People with this disorder risk harming themselves and others when they drink to excess. Alcohol use disorder also can cause other mental disorders, such as mood and anxiety disorders, and serious physical problems. People with alcohol use disorder often misuse other drugs.  Alcohol use disorder is common and widespread. Some people with this disorder drink alcohol to cope with or escape from negative life events. Others drink to relieve chronic pain or symptoms of mental illness. People with a family history of alcohol use disorder are at higher risk of losing control and using alcohol to excess.  SYMPTOMS  Signs and symptoms of alcohol use disorder may include the following:   Consumption ofalcohol inlarger amounts or over a longer period of time than intended.  Multiple unsuccessful attempts to cutdown or control alcohol use.   A great deal of time spent obtaining alcohol, using alcohol, or recovering from the effects of alcohol (hangover).  A strong desire or urge to use alcohol (cravings).   Continued use of alcohol despite problems at work, school, or home because of alcohol use.   Continued use of alcohol despite problems in relationships because of alcohol use.  Continued use of alcohol in situations when it is physically hazardous, such as driving a car.  Continued use of alcohol despite awareness of a physical or psychological problem that is likely related to alcohol use. Physical problems related to alcohol use can involve the brain, heart, liver, stomach, and intestines. Psychological problems related to alcohol use include intoxication, depression, anxiety, psychosis, delirium, and dementia.   The need  for increased amounts of alcohol to achieve the same desired effect, or a decreased effect from the consumption of the same amount of alcohol (tolerance).  Withdrawal symptoms upon reducing or stopping alcohol use, or alcohol use to reduce or avoid withdrawal symptoms. Withdrawal symptoms include:  Racing heart.  Hand tremor.  Difficulty sleeping.  Nausea.  Vomiting.  Hallucinations.  Restlessness.  Seizures. DIAGNOSIS Alcohol use disorder is diagnosed through an assessment by your health care provider. Your health care provider may start by asking three or four questions to screen for excessive or problematic alcohol use. To confirm a diagnosis of alcohol use disorder, at least two symptoms must be present within a 64-month period. The severity of alcohol use disorder depends on the number of symptoms:  Mild--two or three.  Moderate--four or five.  Severe--six or more. Your health care provider may perform a physical exam or use results from lab tests to see if you have physical problems resulting from alcohol use. Your health care provider may refer you to a mental health professional for evaluation. TREATMENT  Some people with alcohol use disorder are able to reduce their alcohol use to low-risk levels. Some people with alcohol use disorder need to quit drinking alcohol. When necessary, mental health professionals with specialized training in substance use treatment can help. Your health care provider can help you decide how severe your alcohol use disorder is and what type of treatment you need. The following forms of treatment are available:   Detoxification. Detoxification involves the use of prescription medicines to prevent alcohol withdrawal symptoms in the first week after quitting. This is important for people with a history of  symptoms of withdrawal and for heavy drinkers who are likely to have withdrawal symptoms. Alcohol withdrawal can be dangerous and, in severe cases,  cause death. Detoxification is usually provided in a hospital or in-patient substance use treatment facility.  Counseling or talk therapy. Talk therapy is provided by substance use treatment counselors. It addresses the reasons people use alcohol and ways to keep them from drinking again. The goals of talk therapy are to help people with alcohol use disorder find healthy activities and ways to cope with life stress, to identify and avoid triggers for alcohol use, and to handle cravings, which can cause relapse.  Medicines.Different medicines can help treat alcohol use disorder through the following actions:  Decrease alcohol cravings.  Decrease the positive reward response felt from alcohol use.  Produce an uncomfortable physical reaction when alcohol is used (aversion therapy).  Support groups. Support groups are run by people who have quit drinking. They provide emotional support, advice, and guidance. These forms of treatment are often combined. Some people with alcohol use disorder benefit from intensive combination treatment provided by specialized substance use treatment centers. Both inpatient and outpatient treatment programs are available. Document Released: 10/23/2004 Document Revised: 01/30/2014 Document Reviewed: 12/23/2012 [facility] Patient Information 2015 [place], [place]. This information is not intended to replace advice given to you by your health care provider. Make sure you discuss any questions you have with your health care provider. Fall Prevention and Home Safety Falls cause injuries and can affect all age groups. It is possible to use preventive measures to significantly decrease the likelihood of falls. There are many simple measures which can make your home safer and prevent falls. OUTDOORS  Repair cracks and edges of walkways and driveways.  Remove high doorway thresholds.  Trim shrubbery on the main path into your home.  Have good outside lighting.  Clear  walkways of tools, rocks, debris, and clutter.  Check that handrails are not broken and are securely fastened. Both sides of steps should have handrails.  Have leaves, snow, and ice cleared regularly.  Use sand or salt on walkways during winter months.  In the garage, clean up grease or oil spills. BATHROOM  Install night lights.  Install grab bars by the toilet and in the tub and shower.  Use non-skid mats or decals in the tub or shower.  Place a plastic non-slip stool in the shower to sit on, if needed.  Keep floors dry and clean up all water on the floor immediately.  Remove soap buildup in the tub or shower on a regular basis.  Secure bath mats with non-slip, double-sided rug tape.  Remove throw rugs and tripping hazards from the floors. BEDROOMS  Install night lights.  Make sure a bedside light is easy to reach.  Do not use oversized bedding.  Keep a telephone by your bedside.  Have a firm chair with side arms to use for getting dressed.  Remove throw rugs and tripping hazards from the floor. KITCHEN  Keep handles on pots and pans turned toward the center of the stove. Use back burners when possible.  Clean up spills quickly and allow time for drying.  Avoid walking on wet floors.  Avoid hot utensils and knives.  Position shelves so they are not too high or low.  Place commonly used objects within easy reach.  If necessary, use a sturdy step stool with a grab bar when reaching.  Keep electrical cables out of the way.  Do not use floor polish or  wax that makes floors slippery. If you must use wax, use non-skid floor wax.  Remove throw rugs and tripping hazards from the floor. STAIRWAYS  Never leave objects on stairs.  Place handrails on both sides of stairways and use them. Fix any loose handrails. Make sure handrails on both sides of the stairways are as long as the stairs.  Check carpeting to make sure it is firmly attached along stairs. Make  repairs to worn or loose carpet promptly.  Avoid placing throw rugs at the top or bottom of stairways, or properly secure the rug with carpet tape to prevent slippage. Get rid of throw rugs, if possible.  Have an electrician put in a light switch at the top and bottom of the stairs. OTHER FALL PREVENTION TIPS  Wear low-heel or rubber-soled shoes that are supportive and fit well. Wear closed toe shoes.  When using a stepladder, make sure it is fully opened and both spreaders are firmly locked. Do not climb a closed stepladder.  Add color or contrast paint or tape to grab bars and handrails in your home. Place contrasting color strips on first and last steps.  Learn and use mobility aids as needed. Install an electrical emergency response system.  Turn on lights to avoid dark areas. Replace light bulbs that burn out immediately. Get light switches that glow.  Arrange furniture to create clear pathways. Keep furniture in the same place.  Firmly attach carpet with non-skid or double-sided tape.  Eliminate uneven floor surfaces.  Select a carpet pattern that does not visually hide the edge of steps.  Be aware of all pets. OTHER HOME SAFETY TIPS  Set the water temperature for 120 F (48.8 C).  Keep emergency numbers on or near the telephone.  Keep smoke detectors on every level of the home and near sleeping areas. Document Released: 09/05/2002 Document Revised: 03/16/2012 Document Reviewed: 12/05/2011 [facility] Patient Information 2015 [place], [place]. This information is not intended to replace advice given to you by your health care provider. Make sure you discuss any questions you have with your health care provider.

## 2015-02-21 ENCOUNTER — Encounter: Payer: Self-pay | Admitting: Internal Medicine

## 2015-02-21 DIAGNOSIS — R259 Unspecified abnormal involuntary movements: Secondary | ICD-10-CM | POA: Diagnosis not present

## 2015-02-22 NOTE — Telephone Encounter (Signed)
Dr Sherrine Maples, 281ml is only enough for 8 days, 30 day supply is 966ml, could you do a new script for either 30 days= 943ml or 3 months= 2770ml. Thanks so much

## 2015-02-27 ENCOUNTER — Other Ambulatory Visit: Payer: Self-pay | Admitting: Internal Medicine

## 2015-02-28 ENCOUNTER — Other Ambulatory Visit: Payer: Self-pay | Admitting: Internal Medicine

## 2015-03-05 ENCOUNTER — Other Ambulatory Visit: Payer: Self-pay | Admitting: *Deleted

## 2015-03-05 MED ORDER — VALPROIC ACID 250 MG/5ML PO SYRP: 500.0000 mg | ORAL_SOLUTION | Freq: Three times a day (TID) | ORAL | Status: DC

## 2015-04-16 ENCOUNTER — Telehealth: Payer: Self-pay | Admitting: Internal Medicine

## 2015-04-16 ENCOUNTER — Encounter: Payer: Self-pay | Admitting: Internal Medicine

## 2015-04-16 ENCOUNTER — Other Ambulatory Visit: Payer: Self-pay | Admitting: *Deleted

## 2015-04-16 DIAGNOSIS — F329 Major depressive disorder, single episode, unspecified: Secondary | ICD-10-CM

## 2015-04-16 DIAGNOSIS — F32A Depression, unspecified: Secondary | ICD-10-CM

## 2015-04-16 MED ORDER — FLUOXETINE HCL 40 MG PO CAPS: 40.0000 mg | ORAL_CAPSULE | Freq: Every day | ORAL | Status: DC

## 2015-04-16 NOTE — Telephone Encounter (Signed)
Attempted to contact patient this pm about getting an appt scheduled.  No answer at main telephone number listed on file [phone]) message was left.  Since the patient has not been seen since 2015 went to contact numbers on file.  The first phone number [phone] was not accepting incoming calls, step-daughter's number [phone]) went straight to voicemail message was left, and third number was for significant other ([phone]) no answer, but message was left.  Patient was mailed letter on 10/26/14 and 02/21/15 and I will be mailing another letter today hoping to receive call back from patient.

## 2015-04-16 NOTE — Telephone Encounter (Signed)
Message sent to front desk pool regarding an appt per Dr Daryll Drown.

## 2015-04-16 NOTE — Telephone Encounter (Signed)
This patient has not been seen since April of 2015.  Will fill 1 month supply and will need an appointment with  New PCP.  If not available, will need [facility] appointment before further refills.

## 2015-05-04 ENCOUNTER — Inpatient Hospital Stay (HOSPITAL_COMMUNITY): Payer: Medicare Other

## 2015-05-04 ENCOUNTER — Emergency Department (HOSPITAL_COMMUNITY): Payer: Medicare Other

## 2015-05-04 ENCOUNTER — Encounter (HOSPITAL_COMMUNITY): Payer: Self-pay | Admitting: Emergency Medicine

## 2015-05-04 ENCOUNTER — Encounter: Payer: Medicare Other | Admitting: Internal Medicine

## 2015-05-04 ENCOUNTER — Inpatient Hospital Stay (HOSPITAL_COMMUNITY)
Admission: EM | Admit: 2015-05-04 | Discharge: 2015-05-07 | DRG: 392 | Disposition: A | Discharge status: Skilled Nursing Facility | Payer: Medicare Other | Attending: Internal Medicine | Admitting: Internal Medicine

## 2015-05-04 DIAGNOSIS — F419 Anxiety disorder, unspecified: Secondary | ICD-10-CM | POA: Diagnosis present

## 2015-05-04 DIAGNOSIS — IMO0001 Reserved for inherently not codable concepts without codable children: Secondary | ICD-10-CM

## 2015-05-04 DIAGNOSIS — R112 Nausea with vomiting, unspecified: Secondary | ICD-10-CM | POA: Diagnosis not present

## 2015-05-04 DIAGNOSIS — R32 Unspecified urinary incontinence: Secondary | ICD-10-CM | POA: Diagnosis present

## 2015-05-04 DIAGNOSIS — E1149 Type 2 diabetes mellitus with other diabetic neurological complication: Secondary | ICD-10-CM | POA: Diagnosis present

## 2015-05-04 DIAGNOSIS — F1011 Alcohol abuse, in remission: Secondary | ICD-10-CM | POA: Diagnosis present

## 2015-05-04 DIAGNOSIS — R1011 Right upper quadrant pain: Secondary | ICD-10-CM

## 2015-05-04 DIAGNOSIS — F1010 Alcohol abuse, uncomplicated: Secondary | ICD-10-CM | POA: Diagnosis present

## 2015-05-04 DIAGNOSIS — H9190 Unspecified hearing loss, unspecified ear: Secondary | ICD-10-CM | POA: Diagnosis present

## 2015-05-04 DIAGNOSIS — E119 Type 2 diabetes mellitus without complications: Secondary | ICD-10-CM

## 2015-05-04 DIAGNOSIS — R1084 Generalized abdominal pain: Secondary | ICD-10-CM | POA: Diagnosis not present

## 2015-05-04 DIAGNOSIS — R569 Unspecified convulsions: Secondary | ICD-10-CM | POA: Diagnosis present

## 2015-05-04 DIAGNOSIS — N183 Chronic kidney disease, stage 3 (moderate): Secondary | ICD-10-CM | POA: Diagnosis present

## 2015-05-04 DIAGNOSIS — K8020 Calculus of gallbladder without cholecystitis without obstruction: Secondary | ICD-10-CM | POA: Diagnosis not present

## 2015-05-04 DIAGNOSIS — R1033 Periumbilical pain: Secondary | ICD-10-CM | POA: Diagnosis not present

## 2015-05-04 DIAGNOSIS — W19XXXA Unspecified fall, initial encounter: Secondary | ICD-10-CM | POA: Diagnosis present

## 2015-05-04 DIAGNOSIS — S299XXA Unspecified injury of thorax, initial encounter: Secondary | ICD-10-CM | POA: Diagnosis not present

## 2015-05-04 DIAGNOSIS — R197 Diarrhea, unspecified: Secondary | ICD-10-CM | POA: Diagnosis not present

## 2015-05-04 DIAGNOSIS — T1490XA Injury, unspecified, initial encounter: Secondary | ICD-10-CM

## 2015-05-04 DIAGNOSIS — E860 Dehydration: Secondary | ICD-10-CM | POA: Diagnosis present

## 2015-05-04 DIAGNOSIS — S8991XA Unspecified injury of right lower leg, initial encounter: Secondary | ICD-10-CM | POA: Diagnosis not present

## 2015-05-04 DIAGNOSIS — I129 Hypertensive chronic kidney disease with stage 1 through stage 4 chronic kidney disease, or unspecified chronic kidney disease: Secondary | ICD-10-CM | POA: Diagnosis present

## 2015-05-04 DIAGNOSIS — E1122 Type 2 diabetes mellitus with diabetic chronic kidney disease: Secondary | ICD-10-CM | POA: Diagnosis present

## 2015-05-04 DIAGNOSIS — F17290 Nicotine dependence, other tobacco product, uncomplicated: Secondary | ICD-10-CM | POA: Diagnosis present

## 2015-05-04 DIAGNOSIS — S0990XA Unspecified injury of head, initial encounter: Secondary | ICD-10-CM | POA: Diagnosis not present

## 2015-05-04 DIAGNOSIS — F17220 Nicotine dependence, chewing tobacco, uncomplicated: Secondary | ICD-10-CM | POA: Diagnosis present

## 2015-05-04 DIAGNOSIS — T730XXA Starvation, initial encounter: Secondary | ICD-10-CM | POA: Diagnosis present

## 2015-05-04 DIAGNOSIS — R1110 Vomiting, unspecified: Secondary | ICD-10-CM | POA: Diagnosis not present

## 2015-05-04 DIAGNOSIS — F329 Major depressive disorder, single episode, unspecified: Secondary | ICD-10-CM | POA: Diagnosis present

## 2015-05-04 DIAGNOSIS — E785 Hyperlipidemia, unspecified: Secondary | ICD-10-CM | POA: Diagnosis present

## 2015-05-04 DIAGNOSIS — I951 Orthostatic hypotension: Secondary | ICD-10-CM | POA: Diagnosis not present

## 2015-05-04 DIAGNOSIS — A084 Viral intestinal infection, unspecified: Secondary | ICD-10-CM | POA: Diagnosis not present

## 2015-05-04 DIAGNOSIS — N184 Chronic kidney disease, stage 4 (severe): Secondary | ICD-10-CM | POA: Diagnosis present

## 2015-05-04 DIAGNOSIS — K4090 Unilateral inguinal hernia, without obstruction or gangrene, not specified as recurrent: Secondary | ICD-10-CM | POA: Diagnosis not present

## 2015-05-04 DIAGNOSIS — Z9181 History of falling: Secondary | ICD-10-CM | POA: Diagnosis not present

## 2015-05-04 DIAGNOSIS — T7491XA Unspecified adult maltreatment, confirmed, initial encounter: Secondary | ICD-10-CM | POA: Diagnosis present

## 2015-05-04 DIAGNOSIS — R109 Unspecified abdominal pain: Secondary | ICD-10-CM

## 2015-05-04 DIAGNOSIS — E875 Hyperkalemia: Secondary | ICD-10-CM | POA: Diagnosis present

## 2015-05-04 DIAGNOSIS — T7691XA Unspecified adult maltreatment, suspected, initial encounter: Secondary | ICD-10-CM | POA: Diagnosis present

## 2015-05-04 DIAGNOSIS — M25561 Pain in right knee: Secondary | ICD-10-CM | POA: Diagnosis not present

## 2015-05-04 DIAGNOSIS — I10 Essential (primary) hypertension: Secondary | ICD-10-CM | POA: Diagnosis present

## 2015-05-04 LAB — COMPREHENSIVE METABOLIC PANEL
ALT: 8 U/L — ABNORMAL LOW (ref 17–63)
ANION GAP: 10 (ref 5–15)
AST: 27 U/L (ref 15–41)
Albumin: 3.6 g/dL (ref 3.5–5.0)
Alkaline Phosphatase: 71 U/L (ref 38–126)
BUN: 16 mg/dL (ref 6–20)
CO2: 23 mmol/L (ref 22–32)
Calcium: 9.1 mg/dL (ref 8.9–10.3)
Chloride: 98 mmol/L — ABNORMAL LOW (ref 101–111)
Creatinine, Ser: 1.32 mg/dL — ABNORMAL HIGH (ref 0.61–1.24)
GFR, EST NON AFRICAN AMERICAN: 53 mL/min — AB (ref >60)
Glucose, Bld: 263 mg/dL — ABNORMAL HIGH (ref 65–99)
POTASSIUM: 5.7 mmol/L — AB (ref 3.5–5.1)
SODIUM: 131 mmol/L — AB (ref 135–145)
Total Bilirubin: 1.1 mg/dL (ref 0.3–1.2)
Total Protein: 6 g/dL — ABNORMAL LOW (ref 6.5–8.1)

## 2015-05-04 LAB — URINALYSIS, ROUTINE W REFLEX MICROSCOPIC
Bilirubin Urine: NEGATIVE
Glucose, UA: >1000 mg/dL — AB
HGB URINE DIPSTICK: NEGATIVE
KETONES UR: NEGATIVE mg/dL
LEUKOCYTES UA: NEGATIVE
Nitrite: NEGATIVE
Protein, ur: NEGATIVE mg/dL
Specific Gravity, Urine: 1.015 (ref 1.005–1.030)
UROBILINOGEN UA: 1 mg/dL (ref 0.0–1.0)
pH: 5.5 (ref 5.0–8.0)

## 2015-05-04 LAB — I-STAT CHEM 8, ED
BUN: 16 mg/dL (ref 6–20)
Calcium, Ion: 1.21 mmol/L (ref 1.13–1.30)
Chloride: 98 mmol/L — ABNORMAL LOW (ref 101–111)
Creatinine, Ser: 1.2 mg/dL (ref 0.61–1.24)
Glucose, Bld: 160 mg/dL — ABNORMAL HIGH (ref 65–99)
HEMATOCRIT: 34 % — AB (ref 39.0–52.0)
Hemoglobin: 11.6 g/dL — ABNORMAL LOW (ref 13.0–17.0)
Potassium: 4.9 mmol/L (ref 3.5–5.1)
SODIUM: 135 mmol/L (ref 135–145)
TCO2: 23 mmol/L (ref 0–100)

## 2015-05-04 LAB — CBC
HCT: 34.9 % — ABNORMAL LOW (ref 39.0–52.0)
HEMOGLOBIN: 11.5 g/dL — AB (ref 13.0–17.0)
MCH: 29.9 pg (ref 26.0–34.0)
MCHC: 33 g/dL (ref 30.0–36.0)
MCV: 90.6 fL (ref 78.0–100.0)
Platelets: 194 10*3/uL (ref 150–400)
RBC: 3.85 MIL/uL — ABNORMAL LOW (ref 4.22–5.81)
RDW: 15.4 % (ref 11.5–15.5)
WBC: 6.4 10*3/uL (ref 4.0–10.5)

## 2015-05-04 LAB — LIPASE, BLOOD: Lipase: 18 U/L — ABNORMAL LOW (ref 22–51)

## 2015-05-04 LAB — ETHANOL: Alcohol, Ethyl (B): <5 mg/dL (ref <5)

## 2015-05-04 LAB — URINE MICROSCOPIC-ADD ON

## 2015-05-04 LAB — GLUCOSE, CAPILLARY: Glucose-Capillary: 141 mg/dL — ABNORMAL HIGH (ref 65–99)

## 2015-05-04 LAB — CBG MONITORING, ED: GLUCOSE-CAPILLARY: 142 mg/dL — AB (ref 65–99)

## 2015-05-04 MED ORDER — LORAZEPAM 2 MG/ML IJ SOLN
1.0000 mg | Freq: Four times a day (QID) | INTRAMUSCULAR | Status: AC | PRN
  Administered 2015-05-05 – 2015-05-06 (×2): 1 mg via INTRAVENOUS
  Filled 2015-05-04 (×2): qty 1

## 2015-05-04 MED ORDER — SODIUM CHLORIDE 0.9 % IV BOLUS (SEPSIS)
1000.0000 mL | Freq: Once | INTRAVENOUS | Status: AC
Start: 2015-05-04 — End: 2015-05-04
  Administered 2015-05-04: 1000 mL via INTRAVENOUS

## 2015-05-04 MED ORDER — ONDANSETRON HCL 4 MG PO TABS: 4.0000 mg | ORAL_TABLET | Freq: Four times a day (QID) | ORAL | Status: DC | PRN

## 2015-05-04 MED ORDER — FOLIC ACID 1 MG PO TABS
1.0000 mg | ORAL_TABLET | Freq: Every day | ORAL | Status: DC
  Administered 2015-05-04 – 2015-05-07 (×4): 1 mg via ORAL
  Filled 2015-05-04 (×4): qty 1

## 2015-05-04 MED ORDER — ONDANSETRON HCL 4 MG/2ML IJ SOLN
4.0000 mg | Freq: Once | INTRAMUSCULAR | Status: AC
  Administered 2015-05-04: 4 mg via INTRAVENOUS
  Filled 2015-05-04: qty 2

## 2015-05-04 MED ORDER — LORAZEPAM 1 MG PO TABS: 1.0000 mg | ORAL_TABLET | Freq: Four times a day (QID) | ORAL | Status: AC | PRN

## 2015-05-04 MED ORDER — ENOXAPARIN SODIUM 40 MG/0.4ML ~~LOC~~ SOLN
40.0000 mg | SUBCUTANEOUS | Status: DC
  Administered 2015-05-04 – 2015-05-07 (×4): 40 mg via SUBCUTANEOUS
  Filled 2015-05-04 (×4): qty 0.4

## 2015-05-04 MED ORDER — ONDANSETRON HCL 4 MG/2ML IJ SOLN
4.0000 mg | Freq: Four times a day (QID) | INTRAMUSCULAR | Status: DC | PRN
  Administered 2015-05-05: 4 mg via INTRAVENOUS
  Filled 2015-05-04: qty 2

## 2015-05-04 MED ORDER — IOHEXOL 300 MG/ML  SOLN
25.0000 mL | Freq: Once | INTRAMUSCULAR | Status: AC | PRN
  Administered 2015-05-04: 25 mL via ORAL

## 2015-05-04 MED ORDER — VITAMIN B-1 100 MG PO TABS
100.0000 mg | ORAL_TABLET | Freq: Every day | ORAL | Status: DC
Start: 2015-05-04 — End: 2015-05-07
  Administered 2015-05-04 – 2015-05-07 (×4): 100 mg via ORAL
  Filled 2015-05-04 (×4): qty 1

## 2015-05-04 MED ORDER — HYDROCODONE-ACETAMINOPHEN 5-325 MG PO TABS
1.0000 | ORAL_TABLET | Freq: Four times a day (QID) | ORAL | Status: DC | PRN
  Administered 2015-05-04 – 2015-05-05 (×2): 1 via ORAL
  Filled 2015-05-04 (×2): qty 1

## 2015-05-04 MED ORDER — IOHEXOL 300 MG/ML  SOLN
80.0000 mL | Freq: Once | INTRAMUSCULAR | Status: AC | PRN
  Administered 2015-05-04: 80 mL via INTRAVENOUS

## 2015-05-04 MED ORDER — SODIUM CHLORIDE 0.9 % IV SOLN
INTRAVENOUS | Status: AC
  Administered 2015-05-04 – 2015-05-05 (×2): via INTRAVENOUS

## 2015-05-04 MED ORDER — ADULT MULTIVITAMIN W/MINERALS CH
1.0000 | ORAL_TABLET | Freq: Every day | ORAL | Status: DC
  Administered 2015-05-04 – 2015-05-07 (×4): 1 via ORAL
  Filled 2015-05-04 (×4): qty 1

## 2015-05-04 MED ORDER — MORPHINE SULFATE 4 MG/ML IJ SOLN
4.0000 mg | Freq: Once | INTRAMUSCULAR | Status: AC
  Administered 2015-05-04: 4 mg via INTRAVENOUS
  Filled 2015-05-04: qty 1

## 2015-05-04 NOTE — ED Notes (Signed)
Belongings transported with pt to [address]. Including walker with pt label on it. Pt given gram crackers prior to leaving department.
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
## 2015-05-04 NOTE — ED Provider Notes (Signed)
Patient signed out to me by Piepenbrink, PA-C.  Plan is to reassess patient after CT scan.  Plan for admission.  CMP remarkable for hyperkalemia and hyponatremia.  CT is remarkable for cholelithiasis, but no cholecystitis.  There is a right inguinal hernia containing a knuckle of unobstructed cecum and a large volume of fat.  Small fat containing left inguinal hernia.  No other acute inflammatory changes are evident in the abdomen or pelvis.  Patient reassessed. Repeat chem 8 shows resolution of hyponatremia and hyperkalemia after 1L of fluids.  I suspect that the hyperkalemia was 2/2 hemolysis. He states that he is feeling better, but on exam he still has significant abdominal tenderness.  Patient has an appointment with Internal Medicine Teaching Service today at 9:30.  I will consult IMTS to have patient seen in ED.  Dispo per IMTS recommendations.  Appreciate IMTS for admitting the patient.  Montine Circle, PA-C 05/04/15 [id number]  Debby Freiberg, MD 05/04/15 [phone]

## 2015-05-04 NOTE — ED Notes (Signed)
Pt. reports emesis with diarrhea , mid abdominal pain , generalized weakness/fatigue and bilateral leg pain onset today . Denies fever or chills.

## 2015-05-04 NOTE — ED Provider Notes (Signed)
CSN: [id number]     Arrival date & time 05/04/15  0230 History   First MD Initiated Contact with Patient 05/04/15 0407     Chief Complaint  Patient presents with  . Emesis  . Diarrhea  . Abdominal Pain     (Consider location/radiation/quality/duration/timing/severity/associated sxs/prior Treatment) HPI Comments: Patient is a 69 yo M PMHx significant for DM, HLD, Anxiety, h/o EtOH abuse, HTN, DM presenting to the ED for evaluation of 2 weeks of worsening abdominal pain with nausea, vomiting, diarrhea. He states he is unable to tolerate anything by mouth. No modifying factors identified. No medications tried prior to arrival. No recent antibodies or travel outside the [place]. Abdominal surgical history includes umbilical hernia repair.  Patient is a 69 y.o. male presenting with vomiting, diarrhea, and abdominal pain. The history is provided by the patient and the spouse.  Emesis Duration:  2 weeks Progression:  Unchanged Chronicity:  New Context: not post-tussive   Relieved by:  Nothing Worsened by:  Ice chips and liquids Ineffective treatments:  None tried Associated symptoms: abdominal pain and diarrhea   Abdominal pain:    Location:  Generalized   Duration:  2 weeks   Progression:  Worsening Diarrhea:    Duration:  2 weeks Risk factors: diabetes and prior abdominal surgery   Risk factors: no sick contacts and no travel to endemic areas   Diarrhea Associated symptoms: abdominal pain and vomiting   Abdominal Pain Associated symptoms: diarrhea and vomiting     Past Medical History  Diagnosis Date  . Diabetes mellitus type II   . Hyperlipemia   . Anxiety   . Alcohol abuse   . HTN (hypertension)   . Cataract   . Alcohol withdrawal seizure   . Diabetes mellitus without complication   . Hypertension   . Hearing impairment   . Cataract   . HTN (hypertension), benign 02/26/2014  . Type II or unspecified type diabetes mellitus with neurological manifestations, not stated as  uncontrolled 02/26/2014  . Chronic renal insufficiency, stage III (moderate) 02/26/2014  . Alcohol abuse 02/26/2014   History reviewed. No pertinent past surgical history. No family history on file. History  Substance Use Topics  . Smoking status: Current Every Day Smoker    Types: Cigars  . Smokeless tobacco: Not on file  . Alcohol Use: Yes    Review of Systems  Gastrointestinal: Positive for vomiting, abdominal pain and diarrhea.  All other systems reviewed and are negative.     Allergies  Review of patient's allergies indicates no known allergies.  Home Medications   Prior to Admission medications   Medication Sig Start Date End Date Taking? Authorizing Provider  acetaminophen (TYLENOL) 500 MG tablet Take 500 mg by mouth every 8 (eight) hours as needed for moderate pain.   Yes Historical Provider, MD  OVER THE COUNTER MEDICATION Take 1 tablet by mouth daily as needed (pain). Arthritis pills   Yes Historical Provider, MD   BP 132/75 mmHg  Pulse 66  Temp(Src) 99.2 F (37.3 C) (Oral)  Resp 11  SpO2 100% Physical Exam  Constitutional: He is oriented to person, place, and time. He appears well-developed and well-nourished.  HENT:  Head: Normocephalic and atraumatic.  Right Ear: External ear normal.  Left Ear: External ear normal.  Nose: Nose normal.  Eyes: Conjunctivae are normal.  Neck: Neck supple.  Cardiovascular: Normal rate, regular rhythm and normal heart sounds.   Pulmonary/Chest: Effort normal and breath sounds normal.  Abdominal: Soft. Bowel sounds are  normal. He exhibits no distension. There is tenderness in the periumbilical area. There is no rebound and no guarding.  Patient exquisitely tender in periumbilical region.   Neurological: He is alert and oriented to person, place, and time.  Skin: Skin is warm and dry.  Nursing note and vitals reviewed.   ED Course  Procedures (including critical care time) Labs Review Labs Reviewed  LIPASE, BLOOD -  Abnormal; Notable for the following:    Lipase 18 (*)    All other components within normal limits  COMPREHENSIVE METABOLIC PANEL - Abnormal; Notable for the following:    Sodium 131 (*)    Potassium 5.7 (*)    Chloride 98 (*)    Glucose, Bld 263 (*)    Creatinine, Ser 1.32 (*)    Total Protein 6.0 (*)    ALT 8 (*)    GFR calc non Af Amer 53 (*)    All other components within normal limits  CBC - Abnormal; Notable for the following:    RBC 3.85 (*)    Hemoglobin 11.5 (*)    HCT 34.9 (*)    All other components within normal limits  URINALYSIS, ROUTINE W REFLEX MICROSCOPIC (NOT AT [facility]) - Abnormal; Notable for the following:    Glucose, UA >1000 (*)    All other components within normal limits  STOOL CULTURE  C DIFFICILE QUICK SCAN W PCR REFLEX  ETHANOL  URINE MICROSCOPIC-ADD ON    Imaging Review No results found.   EKG Interpretation   Date/Time:  Friday May 04 2015 04:07:01 EDT Ventricular Rate:  74 PR Interval:  148 QRS Duration: 80 QT Interval:  355 QTC Calculation: 394 R Axis:   37 Text Interpretation:  Sinus rhythm Probable anteroseptal infarct, recent  Lateral leads are also involved since last tracing no significant change  Confirmed by MILLER  MD, BRIAN ([id number]) on 05/04/2015 4:41:49 AM      MDM   Final diagnoses:  Nausea vomiting and diarrhea  Abdominal pain in male    Filed Vitals:   05/04/15 0515  BP: 132/75  Pulse: 66  Temp:   Resp: 11   Afebrile, NAD, non-toxic appearing, AAOx4.   I have reviewed nursing notes, vital signs, and all lab.  Given abdominal tenderness with 2 weeks of nausea, vomiting, diarrhea will order CT scan for further evaluation. We'll attempt a sense cultures for evaluation of possible C. difficile 2 weeks of diarrhea.   CT scan pending at time of shift change will sign out to [employer], PA-C. Patient d/w with Dr. Sabra Heck, agrees with plan.       Baron Sane, PA-C 05/04/15 [id number]  Noemi Chapel,  MD 05/04/15 [phone]

## 2015-05-04 NOTE — Progress Notes (Signed)
Received report from [facility] in ED

## 2015-05-04 NOTE — H&P (Signed)
Patient very [place] , and unable to answer all question at this time .

## 2015-05-04 NOTE — ED Notes (Signed)
Pt has hx of alcohol abuse; reports last drink was 1 month ago.

## 2015-05-05 DIAGNOSIS — Z598 Other problems related to housing and economic circumstances: Secondary | ICD-10-CM

## 2015-05-05 DIAGNOSIS — R109 Unspecified abdominal pain: Secondary | ICD-10-CM

## 2015-05-05 DIAGNOSIS — Z91410 Personal history of adult physical and sexual abuse: Secondary | ICD-10-CM

## 2015-05-05 DIAGNOSIS — E1122 Type 2 diabetes mellitus with diabetic chronic kidney disease: Secondary | ICD-10-CM

## 2015-05-05 DIAGNOSIS — I129 Hypertensive chronic kidney disease with stage 1 through stage 4 chronic kidney disease, or unspecified chronic kidney disease: Secondary | ICD-10-CM

## 2015-05-05 DIAGNOSIS — R197 Diarrhea, unspecified: Secondary | ICD-10-CM

## 2015-05-05 DIAGNOSIS — R1110 Vomiting, unspecified: Secondary | ICD-10-CM

## 2015-05-05 DIAGNOSIS — N183 Chronic kidney disease, stage 3 (moderate): Secondary | ICD-10-CM

## 2015-05-05 DIAGNOSIS — Z79899 Other long term (current) drug therapy: Secondary | ICD-10-CM

## 2015-05-05 DIAGNOSIS — Z9181 History of falling: Secondary | ICD-10-CM

## 2015-05-05 DIAGNOSIS — F1010 Alcohol abuse, uncomplicated: Secondary | ICD-10-CM

## 2015-05-05 DIAGNOSIS — I951 Orthostatic hypotension: Secondary | ICD-10-CM

## 2015-05-05 LAB — BASIC METABOLIC PANEL
ANION GAP: 5 (ref 5–15)
Anion gap: 7 (ref 5–15)
BUN: 12 mg/dL (ref 6–20)
BUN: 13 mg/dL (ref 6–20)
CALCIUM: 8.8 mg/dL — AB (ref 8.9–10.3)
CO2: 26 mmol/L (ref 22–32)
CO2: 26 mmol/L (ref 22–32)
Calcium: 9.3 mg/dL (ref 8.9–10.3)
Chloride: 106 mmol/L (ref 101–111)
Chloride: 103 mmol/L (ref 101–111)
Creatinine, Ser: 1.09 mg/dL (ref 0.61–1.24)
Creatinine, Ser: 1.34 mg/dL — ABNORMAL HIGH (ref 0.61–1.24)
GFR calc Af Amer: >60 mL/min (ref >60)
GFR calc non Af Amer: >60 mL/min (ref >60)
GFR, EST AFRICAN AMERICAN: 60 mL/min — AB (ref >60)
GFR, EST NON AFRICAN AMERICAN: 52 mL/min — AB (ref >60)
Glucose, Bld: 132 mg/dL — ABNORMAL HIGH (ref 65–99)
Glucose, Bld: 175 mg/dL — ABNORMAL HIGH (ref 65–99)
Potassium: 5.9 mmol/L — ABNORMAL HIGH (ref 3.5–5.1)
Potassium: 5.9 mmol/L — ABNORMAL HIGH (ref 3.5–5.1)
Sodium: 139 mmol/L (ref 135–145)
Sodium: 134 mmol/L — ABNORMAL LOW (ref 135–145)

## 2015-05-05 LAB — CBC
HEMATOCRIT: 39 % (ref 39.0–52.0)
HEMOGLOBIN: 12.9 g/dL — AB (ref 13.0–17.0)
MCH: 30.6 pg (ref 26.0–34.0)
MCHC: 33.1 g/dL (ref 30.0–36.0)
MCV: 92.4 fL (ref 78.0–100.0)
Platelets: 151 10*3/uL (ref 150–400)
RBC: 4.22 MIL/uL (ref 4.22–5.81)
RDW: 15.9 % — ABNORMAL HIGH (ref 11.5–15.5)
WBC: 4.4 10*3/uL (ref 4.0–10.5)

## 2015-05-05 LAB — GLUCOSE, CAPILLARY: Glucose-Capillary: 129 mg/dL — ABNORMAL HIGH (ref 65–99)

## 2015-05-05 MED ORDER — FUROSEMIDE 10 MG/ML IJ SOLN
20.0000 mg | Freq: Once | INTRAMUSCULAR | Status: AC
  Administered 2015-05-05: 20 mg via INTRAVENOUS
  Filled 2015-05-05: qty 2

## 2015-05-05 MED ORDER — SODIUM CHLORIDE 0.9 % IV SOLN
INTRAVENOUS | Status: AC
  Administered 2015-05-05 (×2): via INTRAVENOUS

## 2015-05-05 MED ORDER — SODIUM CHLORIDE 0.9 % IV BOLUS (SEPSIS)
500.0000 mL | Freq: Once | INTRAVENOUS | Status: AC
  Administered 2015-05-05: 500 mL via INTRAVENOUS
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
## 2015-05-06 ENCOUNTER — Inpatient Hospital Stay (HOSPITAL_COMMUNITY): Payer: Medicare Other

## 2015-05-06 LAB — FOLATE: Folate: 9.8 ng/mL (ref >5.9)

## 2015-05-06 LAB — VITAMIN B12: VITAMIN B 12: 254 pg/mL (ref 180–914)

## 2015-05-06 LAB — BASIC METABOLIC PANEL
Anion gap: 7 (ref 5–15)
BUN: 15 mg/dL (ref 6–20)
CALCIUM: 9 mg/dL (ref 8.9–10.3)
CO2: 27 mmol/L (ref 22–32)
Chloride: 104 mmol/L (ref 101–111)
Creatinine, Ser: 1.28 mg/dL — ABNORMAL HIGH (ref 0.61–1.24)
GFR, EST NON AFRICAN AMERICAN: 55 mL/min — AB (ref >60)
GLUCOSE: 137 mg/dL — AB (ref 65–99)
Potassium: 5 mmol/L (ref 3.5–5.1)
Sodium: 138 mmol/L (ref 135–145)

## 2015-05-06 LAB — IRON AND TIBC
Iron: 93 ug/dL (ref 45–182)
SATURATION RATIOS: 28 % (ref 17.9–39.5)
TIBC: 328 ug/dL (ref 250–450)
UIBC: 235 ug/dL

## 2015-05-06 LAB — RETICULOCYTES
RBC.: 3.66 MIL/uL — AB (ref 4.22–5.81)
RETIC COUNT ABSOLUTE: 73.2 10*3/uL (ref 19.0–186.0)
Retic Ct Pct: 2 % (ref 0.4–3.1)

## 2015-05-06 LAB — FERRITIN: Ferritin: 16 ng/mL — ABNORMAL LOW (ref 24–336)
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
## 2015-05-07 DIAGNOSIS — A084 Viral intestinal infection, unspecified: Principal | ICD-10-CM

## 2015-05-07 LAB — BASIC METABOLIC PANEL
Anion gap: 8 (ref 5–15)
BUN: 19 mg/dL (ref 6–20)
CHLORIDE: 104 mmol/L (ref 101–111)
CO2: 26 mmol/L (ref 22–32)
Calcium: 9 mg/dL (ref 8.9–10.3)
Creatinine, Ser: 1.19 mg/dL (ref 0.61–1.24)
GFR calc Af Amer: >60 mL/min (ref >60)
GFR calc non Af Amer: 60 mL/min — ABNORMAL LOW (ref >60)
GLUCOSE: 125 mg/dL — AB (ref 65–99)
Potassium: 4.8 mmol/L (ref 3.5–5.1)
SODIUM: 138 mmol/L (ref 135–145)

## 2015-05-07 MED ORDER — HYDROCODONE-ACETAMINOPHEN 5-325 MG PO TABS: 1.0000 | ORAL_TABLET | Freq: Four times a day (QID) | ORAL | Status: DC | PRN

## 2015-05-07 MED ORDER — ONDANSETRON HCL 4 MG PO TABS: 4.0000 mg | ORAL_TABLET | Freq: Four times a day (QID) | ORAL | Status: DC | PRN

## 2015-05-07 MED ORDER — THIAMINE HCL 100 MG PO TABS: 100.0000 mg | ORAL_TABLET | Freq: Every day | ORAL | Status: DC

## 2015-05-07 MED ORDER — FOLIC ACID 1 MG PO TABS: 1.0000 mg | ORAL_TABLET | Freq: Every day | ORAL | Status: DC
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
## 2015-05-07 NOTE — Progress Notes (Signed)
Pt verbalizes understanding of all discharge instructions and discharged to SNF in stable condition.

## 2015-05-07 NOTE — Discharge Instructions (Signed)

## 2015-05-07 NOTE — Progress Notes (Signed)
Internal Medicine Attending  Date: 05/07/2015  Patient name: William [name] Medical record number: [id number] Date of birth: [birth year] Age: 69 y.o. Gender: male  I saw and evaluated the patient. I reviewed the resident's note by Dr. Marijean Bravo and I agree with the resident's findings and plans as documented in his progress note.  Mr. Edgecomb was seen on rounds this AM with the housestaff.  He denied any nausea, vomiting, or diarrhea.  He ate his complete breakfast and was asking for additional food.  He likely had a viral gastroenteritis, which has since resolved.  He is transferred to a SNF today for further rehabilitation.

## 2015-05-07 NOTE — Care Management Note (Signed)
Case Management Note  Patient Details  Name: William [name] MRN: [id number] Date of Birth: 07/07/1946  Subjective/Objective:    69 y.o. M to be discharged to SNF today.                 Action/Plan: No CM needs.   Expected Discharge Date:  05/07/15               Expected Discharge Plan:     In-House Referral:     Discharge planning Services     Post Acute Care Choice:    Choice offered to:     DME Arranged:    DME Agency:     HH Arranged:    HH Agency:     Status of Service:     Medicare Important Message Given:   Y Date Medicare IM Given:   05/07/2015 Medicare IM give by:   Delrae Sawyers, RNCM Date Additional Medicare IM Given:    Additional Medicare Important Message give by:     If discussed at [place] of Stay Meetings, dates discussed:    Additional Comments:  Delrae Sawyers, RN 05/07/2015, 6:06 PM
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
## 2015-05-07 NOTE — Progress Notes (Signed)
OT Cancellation Note  Patient Details Name: William [name] MRN: [id number] DOB: Jul 11, 1946   Cancelled Treatment:    Reason Eval/Treat Not Completed:  (OT screened) Pt's current D/C plan is SNF (spoke with nurse and plan is today). No apparent immediate acute care OT needs, therefore will defer OT to SNF. If OT eval is needed please call Acute Rehab Dept. at [phone] or text page OT at [phone].    Benito Mccreedy OTR/L [id number] 05/07/2015, 3:33 PM
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
## 2015-05-07 NOTE — Progress Notes (Signed)
Attempted to call report to [facility], was on hold for 10 minutes.

## 2015-05-10 ENCOUNTER — Telehealth: Payer: Self-pay | Admitting: Internal Medicine

## 2015-05-10 NOTE — Telephone Encounter (Signed)
Call to patient to confirm appointment for 05/11/15 at 9:30 lmtcb

## 2015-05-11 ENCOUNTER — Ambulatory Visit: Payer: Medicare Other | Admitting: Internal Medicine

## 2015-05-11 ENCOUNTER — Encounter: Payer: Self-pay | Admitting: Internal Medicine

## 2015-05-16 DIAGNOSIS — M6281 Muscle weakness (generalized): Secondary | ICD-10-CM | POA: Diagnosis not present

## 2015-05-16 DIAGNOSIS — R5381 Other malaise: Secondary | ICD-10-CM | POA: Diagnosis not present

## 2015-05-16 DIAGNOSIS — R2689 Other abnormalities of gait and mobility: Secondary | ICD-10-CM | POA: Diagnosis not present

## 2015-05-17 DIAGNOSIS — R5381 Other malaise: Secondary | ICD-10-CM | POA: Diagnosis not present

## 2015-05-17 DIAGNOSIS — R2689 Other abnormalities of gait and mobility: Secondary | ICD-10-CM | POA: Diagnosis not present

## 2015-05-17 DIAGNOSIS — M6281 Muscle weakness (generalized): Secondary | ICD-10-CM | POA: Diagnosis not present

## 2015-06-06 DIAGNOSIS — R5381 Other malaise: Secondary | ICD-10-CM | POA: Diagnosis not present

## 2015-06-06 DIAGNOSIS — M6281 Muscle weakness (generalized): Secondary | ICD-10-CM | POA: Diagnosis not present

## 2015-06-06 DIAGNOSIS — R2689 Other abnormalities of gait and mobility: Secondary | ICD-10-CM | POA: Diagnosis not present

## 2015-06-29 DIAGNOSIS — Z23 Encounter for immunization: Secondary | ICD-10-CM | POA: Diagnosis not present

## 2015-07-05 DIAGNOSIS — R32 Unspecified urinary incontinence: Secondary | ICD-10-CM | POA: Diagnosis not present

## 2015-07-05 DIAGNOSIS — E1165 Type 2 diabetes mellitus with hyperglycemia: Secondary | ICD-10-CM | POA: Diagnosis not present

## 2015-07-05 DIAGNOSIS — R569 Unspecified convulsions: Secondary | ICD-10-CM | POA: Diagnosis not present

## 2015-07-05 DIAGNOSIS — I10 Essential (primary) hypertension: Secondary | ICD-10-CM | POA: Diagnosis not present

## 2015-08-09 DIAGNOSIS — E669 Obesity, unspecified: Secondary | ICD-10-CM | POA: Diagnosis not present

## 2015-08-09 DIAGNOSIS — E1165 Type 2 diabetes mellitus with hyperglycemia: Secondary | ICD-10-CM | POA: Diagnosis not present

## 2015-08-09 DIAGNOSIS — E1122 Type 2 diabetes mellitus with diabetic chronic kidney disease: Secondary | ICD-10-CM | POA: Diagnosis not present

## 2015-08-09 DIAGNOSIS — I10 Essential (primary) hypertension: Secondary | ICD-10-CM | POA: Diagnosis not present

## 2015-08-14 DIAGNOSIS — E1165 Type 2 diabetes mellitus with hyperglycemia: Secondary | ICD-10-CM | POA: Diagnosis not present

## 2015-08-30 DIAGNOSIS — I10 Essential (primary) hypertension: Secondary | ICD-10-CM | POA: Diagnosis not present

## 2015-08-30 DIAGNOSIS — F0390 Unspecified dementia without behavioral disturbance: Secondary | ICD-10-CM | POA: Diagnosis not present

## 2015-08-30 DIAGNOSIS — E1165 Type 2 diabetes mellitus with hyperglycemia: Secondary | ICD-10-CM | POA: Diagnosis not present

## 2015-08-30 DIAGNOSIS — E785 Hyperlipidemia, unspecified: Secondary | ICD-10-CM | POA: Diagnosis not present

## 2015-10-10 ENCOUNTER — Emergency Department (HOSPITAL_COMMUNITY)
Admission: EM | Admit: 2015-10-10 | Discharge: 2015-10-11 | Disposition: A | Discharge status: Home / Self Care | Payer: Medicare Other | Attending: Emergency Medicine | Admitting: Emergency Medicine

## 2015-10-10 ENCOUNTER — Encounter (HOSPITAL_COMMUNITY): Payer: Self-pay | Admitting: Emergency Medicine

## 2015-10-10 DIAGNOSIS — Y92129 Unspecified place in nursing home as the place of occurrence of the external cause: Secondary | ICD-10-CM | POA: Diagnosis not present

## 2015-10-10 DIAGNOSIS — E119 Type 2 diabetes mellitus without complications: Secondary | ICD-10-CM | POA: Insufficient documentation

## 2015-10-10 DIAGNOSIS — F17210 Nicotine dependence, cigarettes, uncomplicated: Secondary | ICD-10-CM | POA: Diagnosis not present

## 2015-10-10 DIAGNOSIS — S50811A Abrasion of right forearm, initial encounter: Secondary | ICD-10-CM | POA: Insufficient documentation

## 2015-10-10 DIAGNOSIS — E1149 Type 2 diabetes mellitus with other diabetic neurological complication: Secondary | ICD-10-CM | POA: Insufficient documentation

## 2015-10-10 DIAGNOSIS — Z8669 Personal history of other diseases of the nervous system and sense organs: Secondary | ICD-10-CM | POA: Diagnosis not present

## 2015-10-10 DIAGNOSIS — Y9389 Activity, other specified: Secondary | ICD-10-CM | POA: Diagnosis not present

## 2015-10-10 DIAGNOSIS — N183 Chronic kidney disease, stage 3 (moderate): Secondary | ICD-10-CM | POA: Diagnosis not present

## 2015-10-10 DIAGNOSIS — R454 Irritability and anger: Secondary | ICD-10-CM | POA: Insufficient documentation

## 2015-10-10 DIAGNOSIS — Z79899 Other long term (current) drug therapy: Secondary | ICD-10-CM | POA: Insufficient documentation

## 2015-10-10 DIAGNOSIS — Z8659 Personal history of other mental and behavioral disorders: Secondary | ICD-10-CM | POA: Diagnosis not present

## 2015-10-10 DIAGNOSIS — S50812A Abrasion of left forearm, initial encounter: Secondary | ICD-10-CM | POA: Insufficient documentation

## 2015-10-10 DIAGNOSIS — X58XXXA Exposure to other specified factors, initial encounter: Secondary | ICD-10-CM | POA: Insufficient documentation

## 2015-10-10 DIAGNOSIS — F919 Conduct disorder, unspecified: Secondary | ICD-10-CM | POA: Diagnosis present

## 2015-10-10 DIAGNOSIS — I129 Hypertensive chronic kidney disease with stage 1 through stage 4 chronic kidney disease, or unspecified chronic kidney disease: Secondary | ICD-10-CM | POA: Insufficient documentation

## 2015-10-10 DIAGNOSIS — Y998 Other external cause status: Secondary | ICD-10-CM | POA: Diagnosis not present

## 2015-10-10 LAB — BASIC METABOLIC PANEL
ANION GAP: 10 (ref 5–15)
BUN: 15 mg/dL (ref 6–20)
CHLORIDE: 107 mmol/L (ref 101–111)
CO2: 23 mmol/L (ref 22–32)
CREATININE: 1.38 mg/dL — AB (ref 0.61–1.24)
Calcium: 9.4 mg/dL (ref 8.9–10.3)
GFR calc non Af Amer: 50 mL/min — ABNORMAL LOW (ref >60)
GFR, EST AFRICAN AMERICAN: 58 mL/min — AB (ref >60)
Glucose, Bld: 174 mg/dL — ABNORMAL HIGH (ref 65–99)
POTASSIUM: 4.6 mmol/L (ref 3.5–5.1)
Sodium: 140 mmol/L (ref 135–145)

## 2015-10-10 LAB — URINE MICROSCOPIC-ADD ON

## 2015-10-10 LAB — RAPID URINE DRUG SCREEN, HOSP PERFORMED
AMPHETAMINES: NOT DETECTED
BARBITURATES: NOT DETECTED
BENZODIAZEPINES: NOT DETECTED
COCAINE: NOT DETECTED
Opiates: NOT DETECTED
Tetrahydrocannabinol: NOT DETECTED

## 2015-10-10 LAB — URINALYSIS, ROUTINE W REFLEX MICROSCOPIC
Bilirubin Urine: NEGATIVE
Glucose, UA: NEGATIVE mg/dL
Ketones, ur: NEGATIVE mg/dL
Nitrite: NEGATIVE
Protein, ur: NEGATIVE mg/dL
Specific Gravity, Urine: 1.014 (ref 1.005–1.030)
pH: 5.5 (ref 5.0–8.0)

## 2015-10-10 LAB — CBC
HEMATOCRIT: 36.9 % — AB (ref 39.0–52.0)
HEMOGLOBIN: 11.7 g/dL — AB (ref 13.0–17.0)
MCH: 26.7 pg (ref 26.0–34.0)
MCHC: 31.7 g/dL (ref 30.0–36.0)
MCV: 84.1 fL (ref 78.0–100.0)
Platelets: 218 10*3/uL (ref 150–400)
RBC: 4.39 MIL/uL (ref 4.22–5.81)
RDW: 16.9 % — ABNORMAL HIGH (ref 11.5–15.5)
WBC: 6.8 10*3/uL (ref 4.0–10.5)

## 2015-10-10 LAB — ETHANOL: Alcohol, Ethyl (B): <5 mg/dL (ref <5)

## 2015-10-10 MED ORDER — HYDROCODONE-ACETAMINOPHEN 5-325 MG PO TABS: 1.0000 | ORAL_TABLET | Freq: Four times a day (QID) | ORAL | Status: DC | PRN

## 2015-10-10 MED ORDER — FOLIC ACID 1 MG PO TABS
1.0000 mg | ORAL_TABLET | Freq: Every day | ORAL | Status: DC
  Administered 2015-10-11: 1 mg via ORAL
  Filled 2015-10-10: qty 1

## 2015-10-10 MED ORDER — VITAMIN B-1 100 MG PO TABS
100.0000 mg | ORAL_TABLET | Freq: Every day | ORAL | Status: DC
  Administered 2015-10-11: 100 mg via ORAL

## 2015-10-10 MED ORDER — DULOXETINE HCL 30 MG PO CPEP
30.0000 mg | ORAL_CAPSULE | Freq: Every day | ORAL | Status: DC
  Administered 2015-10-11: 30 mg via ORAL
  Filled 2015-10-10: qty 1

## 2015-10-10 MED ORDER — ACETAMINOPHEN 500 MG PO TABS: 500.0000 mg | ORAL_TABLET | Freq: Three times a day (TID) | ORAL | Status: DC | PRN

## 2015-10-10 MED ORDER — DIAZEPAM 5 MG PO TABS
2.5000 mg | ORAL_TABLET | Freq: Once | ORAL | Status: AC
  Administered 2015-10-10: 2.5 mg via ORAL
  Filled 2015-10-10: qty 1

## 2015-10-10 MED ORDER — GLIPIZIDE 10 MG PO TABS
10.0000 mg | ORAL_TABLET | Freq: Two times a day (BID) | ORAL | Status: DC
  Administered 2015-10-11: 10 mg via ORAL
  Filled 2015-10-10 (×3): qty 1

## 2015-10-10 MED ORDER — ASPIRIN EC 81 MG PO TBEC
81.0000 mg | DELAYED_RELEASE_TABLET | Freq: Every day | ORAL | Status: DC
  Administered 2015-10-11: 81 mg via ORAL
  Filled 2015-10-10: qty 1

## 2015-10-10 MED ORDER — DONEPEZIL HCL 5 MG PO TABS
10.0000 mg | ORAL_TABLET | Freq: Every day | ORAL | Status: DC
  Administered 2015-10-10: 10 mg via ORAL
  Filled 2015-10-10: qty 2

## 2015-10-10 MED ORDER — LISINOPRIL 5 MG PO TABS
5.0000 mg | ORAL_TABLET | Freq: Every day | ORAL | Status: DC
  Administered 2015-10-11: 5 mg via ORAL
  Filled 2015-10-10: qty 1

## 2015-10-10 MED ORDER — ONDANSETRON HCL 4 MG PO TABS: 4.0000 mg | ORAL_TABLET | Freq: Four times a day (QID) | ORAL | Status: DC | PRN

## 2015-10-10 MED ORDER — PRAVASTATIN SODIUM 10 MG PO TABS
10.0000 mg | ORAL_TABLET | Freq: Every day | ORAL | Status: DC
  Filled 2015-10-10: qty 1

## 2015-10-10 MED ORDER — CEPHALEXIN 500 MG PO CAPS
500.0000 mg | ORAL_CAPSULE | Freq: Four times a day (QID) | ORAL | Status: DC
  Administered 2015-10-10 – 2015-10-11 (×4): 500 mg via ORAL
  Filled 2015-10-10 (×4): qty 1
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
## 2015-10-10 NOTE — ED Provider Notes (Signed)
Patient was accepted in sign out pending UA results. Patient not homicidal or suicidal. UA consistent with infection and Keflex ordered. Before my evaluation of the patient after sign out Behavioral Health saw and evaluated the patient at bedside and recommended that the patient be kept overnight for psychiatrist to see him in the morning. Labs were ordered for this reason and psych hold orders were placed. Dispo pending psychiatrist evaluation in the morning.  Harvel Quale, MD 10/10/15 2001

## 2015-10-10 NOTE — Discharge Instructions (Signed)
Anger Management Anger is a normal human emotion. However, anger can range from mild irritation to rage. When your anger becomes harmful to yourself or others, it is unhealthy anger.  CAUSES  There are many reasons for unhealthy anger. Many people learn how to express anger from observing how their family expressed anger. In troubled, chaotic, or abusive families, anger can be expressed as rage or even violence. Children can grow up never learning how healthy anger can be expressed. Factors that contribute to unhealthy anger include:   Drug or alcohol abuse.  Post-traumatic stress disorder.  Traumatic brain injury. COMPLICATIONS  People with unhealthy anger tend to overreact and retaliate against a real or imagined threat. The need to retaliate can turn into violence or verbal abuse against another person. Chronic anger can lead to health problems, such as hypertension, high blood pressure, and depression. TREATMENT  Exercising, relaxing, meditating, or writing out your feelings all can be beneficial in managing moderate anger. For unhealthy anger, the following methods may be used:  Cognitive-behavioral counseling (learning skills to change the thoughts that influence your mood).  Relaxation training.  Interpersonal counseling.  Assertive communication skills.  Medication.   This information is not intended to replace advice given to you by your health care provider. Make sure you discuss any questions you have with your health care provider.   Document Released: 07/13/2007 Document Revised: 12/08/2011 Document Reviewed: 11/21/2010 Elsevier Interactive Patient Education [employer].
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
## 2015-10-10 NOTE — ED Notes (Signed)
[facility] received.

## 2015-10-10 NOTE — ED Notes (Signed)
[facility] Staff member Cassandria Anger) faxing current copy of pts medications.

## 2015-10-10 NOTE — [facility] Counselor (Signed)
Per Samuel Jester, NP patient to remain in ED overnight, and a.m. Psych evaluation. Rudra Hobbins K. Glendon Axe, [facility]  Counselor 10/10/2015 7:15 PM

## 2015-10-10 NOTE — ED Notes (Signed)
Bed: [id number] Expected date:  Expected time:  Means of arrival:  Comments: T6

## 2015-10-11 DIAGNOSIS — R456 Violent behavior: Secondary | ICD-10-CM | POA: Diagnosis not present

## 2015-10-11 NOTE — Progress Notes (Addendum)
10:30a- CSW spoke with Wells Guiles at [facility] to inform her that patient had been cleared for discharge to come back to their facility. She stated there transportation person was not in at the time. CSW informed Wells Guiles that transportation could be arranged for patient to return to their facility.  Message was received from the nurse stating transportation that was previously called was inquiring about payment. Nurse stated she would call another transportation service ([facility]) to request transportation for patient to return to the facility.   Genice Rouge [id number] ED CSW 10/11/2015 11:55 AM

## 2015-10-11 NOTE — ED Notes (Addendum)
Pt remains a 1:1. -Pt has been sleeping on his right side with regular respirations. Pt ate 100% of his breakfast. He is cooperative. Pt does use a walker to ambulate. Phoned [facility] to pick the pt up. They stated they needed to know who would pay for this transportation before they will pick him up,.Social work notified and will make inquiries. Phoned [facility] to pick the pt up to go back to [facility] care. Phoned [facility] and spoke to [employer] , the resident Mudlogger. She was informed the pt will be coming back to room 35 shortly. Phoned [facility] about them needing to pick up the pts walker as [facility] was not allowed to transport this.

## 2015-10-14 LAB — URINE CULTURE: Culture: >=100000

## 2015-10-15 ENCOUNTER — Telehealth (HOSPITAL_BASED_OUTPATIENT_CLINIC_OR_DEPARTMENT_OTHER): Payer: Self-pay | Admitting: Emergency Medicine

## 2015-10-15 NOTE — Telephone Encounter (Signed)
Post ED Visit - Positive Culture Follow-up: Successful Patient Follow-Up  Culture assessed and recommendations reviewed by: []  Elenor Quinones, Pharm.D. []  Heide Guile, Pharm.D., BCPS []  Parks Neptune, Pharm.D. []  Alycia Rossetti, Pharm.D., BCPS []  [place], [place].D., BCPS, AAHIVP []  Legrand Como, Pharm.D., BCPS, AAHIVP []  Milus Glazier, Pharm.D. []  Stephens November, Pharm.D.  Positive urine culture  [x]  Patient discharged without antimicrobial prescription and treatment is now indicated []  Organism is resistant to prescribed ED discharge antimicrobial []  Patient with positive blood cultures  Changes discussed with ED provider: Monico Blitz PA New antibiotic prescription Keflex 500mg  po bid x 14 days [facility] @ [phone], order also faxed to [facility]  @ [phone]  Contacted caregiver @ 10/15/2015 1305   William [name] 10/15/2015, 1:05 PM

## 2015-10-15 NOTE — Progress Notes (Signed)
ED Antimicrobial Stewardship Positive Culture Follow Up   William [name] is an 70 y.o. male who presented to [facility] on 10/10/2015 with a chief complaint of  Chief Complaint  Patient presents with  . IVC     Recent Results (from the past 720 hour(s))  Urine culture     Status: None   Collection Time: 10/10/15  5:07 PM  Result Value Ref Range Status   Specimen Description URINE, RANDOM  Final   Special Requests NONE  Final   Culture   Final    >=100,000 COLONIES/mL ESCHERICHIA COLI Performed at [facility]    Report Status 10/14/2015 FINAL  Final   Organism ID, Bacteria ESCHERICHIA COLI  Final      Susceptibility   Escherichia coli - MIC*    AMPICILLIN <=2 SENSITIVE Sensitive     CEFAZOLIN <=4 SENSITIVE Sensitive     CEFTRIAXONE <=1 SENSITIVE Sensitive     CIPROFLOXACIN <=0.25 SENSITIVE Sensitive     GENTAMICIN <=1 SENSITIVE Sensitive     IMIPENEM <=0.25 SENSITIVE Sensitive     NITROFURANTOIN <=16 SENSITIVE Sensitive     TRIMETH/SULFA <=20 SENSITIVE Sensitive     AMPICILLIN/SULBACTAM <=2 SENSITIVE Sensitive     PIP/TAZO <=4 SENSITIVE Sensitive     * >=100,000 COLONIES/mL ESCHERICHIA COLI   [x]  Patient discharged originally without antimicrobial agent and treatment is now indicated  New antibiotic prescription: Cephalexin 500mg  BID x14 days  ED Provider: Monico Blitz, PA-C  Jonna Munro, PharmD Candidate

## 2015-10-18 DIAGNOSIS — I10 Essential (primary) hypertension: Secondary | ICD-10-CM | POA: Diagnosis not present

## 2015-10-18 DIAGNOSIS — L989 Disorder of the skin and subcutaneous tissue, unspecified: Secondary | ICD-10-CM | POA: Diagnosis not present

## 2015-10-18 DIAGNOSIS — E1165 Type 2 diabetes mellitus with hyperglycemia: Secondary | ICD-10-CM | POA: Diagnosis not present

## 2015-10-18 DIAGNOSIS — L299 Pruritus, unspecified: Secondary | ICD-10-CM | POA: Diagnosis not present

## 2015-10-23 DIAGNOSIS — Z961 Presence of intraocular lens: Secondary | ICD-10-CM | POA: Diagnosis not present

## 2015-10-23 DIAGNOSIS — E119 Type 2 diabetes mellitus without complications: Secondary | ICD-10-CM | POA: Diagnosis not present

## 2015-11-01 DIAGNOSIS — I10 Essential (primary) hypertension: Secondary | ICD-10-CM | POA: Diagnosis not present

## 2015-11-01 DIAGNOSIS — E1165 Type 2 diabetes mellitus with hyperglycemia: Secondary | ICD-10-CM | POA: Diagnosis not present

## 2015-11-01 DIAGNOSIS — L989 Disorder of the skin and subcutaneous tissue, unspecified: Secondary | ICD-10-CM | POA: Diagnosis not present

## 2015-11-12 DIAGNOSIS — E119 Type 2 diabetes mellitus without complications: Secondary | ICD-10-CM | POA: Diagnosis not present

## 2015-11-12 DIAGNOSIS — I10 Essential (primary) hypertension: Secondary | ICD-10-CM | POA: Diagnosis not present

## 2015-11-12 DIAGNOSIS — F331 Major depressive disorder, recurrent, moderate: Secondary | ICD-10-CM | POA: Diagnosis not present

## 2015-11-12 DIAGNOSIS — R197 Diarrhea, unspecified: Secondary | ICD-10-CM | POA: Diagnosis not present

## 2015-11-16 DIAGNOSIS — F39 Unspecified mood [affective] disorder: Secondary | ICD-10-CM | POA: Diagnosis not present

## 2015-11-19 ENCOUNTER — Emergency Department (HOSPITAL_COMMUNITY): Payer: Medicare Other

## 2015-11-19 ENCOUNTER — Encounter (HOSPITAL_COMMUNITY): Payer: Self-pay | Admitting: Emergency Medicine

## 2015-11-19 ENCOUNTER — Inpatient Hospital Stay (HOSPITAL_COMMUNITY)
Admission: EM | Admit: 2015-11-19 | Discharge: 2015-11-21 | DRG: 689 | Disposition: A | Discharge status: Skilled Nursing Facility | Payer: Medicare Other | Attending: Internal Medicine | Admitting: Internal Medicine

## 2015-11-19 DIAGNOSIS — E871 Hypo-osmolality and hyponatremia: Secondary | ICD-10-CM | POA: Diagnosis present

## 2015-11-19 DIAGNOSIS — E1122 Type 2 diabetes mellitus with diabetic chronic kidney disease: Secondary | ICD-10-CM | POA: Diagnosis present

## 2015-11-19 DIAGNOSIS — G309 Alzheimer's disease, unspecified: Secondary | ICD-10-CM | POA: Diagnosis present

## 2015-11-19 DIAGNOSIS — B952 Enterococcus as the cause of diseases classified elsewhere: Secondary | ICD-10-CM | POA: Insufficient documentation

## 2015-11-19 DIAGNOSIS — F411 Generalized anxiety disorder: Secondary | ICD-10-CM | POA: Diagnosis present

## 2015-11-19 DIAGNOSIS — Z7982 Long term (current) use of aspirin: Secondary | ICD-10-CM

## 2015-11-19 DIAGNOSIS — E119 Type 2 diabetes mellitus without complications: Secondary | ICD-10-CM

## 2015-11-19 DIAGNOSIS — F329 Major depressive disorder, single episode, unspecified: Secondary | ICD-10-CM | POA: Diagnosis present

## 2015-11-19 DIAGNOSIS — E1149 Type 2 diabetes mellitus with other diabetic neurological complication: Secondary | ICD-10-CM | POA: Diagnosis not present

## 2015-11-19 DIAGNOSIS — E785 Hyperlipidemia, unspecified: Secondary | ICD-10-CM | POA: Diagnosis present

## 2015-11-19 DIAGNOSIS — N183 Chronic kidney disease, stage 3 (moderate): Secondary | ICD-10-CM | POA: Diagnosis present

## 2015-11-19 DIAGNOSIS — I10 Essential (primary) hypertension: Secondary | ICD-10-CM | POA: Diagnosis present

## 2015-11-19 DIAGNOSIS — N390 Urinary tract infection, site not specified: Principal | ICD-10-CM | POA: Diagnosis present

## 2015-11-19 DIAGNOSIS — R4182 Altered mental status, unspecified: Secondary | ICD-10-CM | POA: Diagnosis not present

## 2015-11-19 DIAGNOSIS — N184 Chronic kidney disease, stage 4 (severe): Secondary | ICD-10-CM | POA: Diagnosis present

## 2015-11-19 DIAGNOSIS — D509 Iron deficiency anemia, unspecified: Secondary | ICD-10-CM | POA: Diagnosis present

## 2015-11-19 DIAGNOSIS — R569 Unspecified convulsions: Secondary | ICD-10-CM | POA: Diagnosis not present

## 2015-11-19 DIAGNOSIS — A059 Bacterial foodborne intoxication, unspecified: Secondary | ICD-10-CM | POA: Diagnosis present

## 2015-11-19 DIAGNOSIS — H9190 Unspecified hearing loss, unspecified ear: Secondary | ICD-10-CM | POA: Diagnosis present

## 2015-11-19 DIAGNOSIS — E860 Dehydration: Secondary | ICD-10-CM | POA: Diagnosis present

## 2015-11-19 DIAGNOSIS — F17290 Nicotine dependence, other tobacco product, uncomplicated: Secondary | ICD-10-CM | POA: Diagnosis present

## 2015-11-19 DIAGNOSIS — G9340 Encephalopathy, unspecified: Secondary | ICD-10-CM | POA: Diagnosis not present

## 2015-11-19 DIAGNOSIS — F0281 Dementia in other diseases classified elsewhere with behavioral disturbance: Secondary | ICD-10-CM | POA: Diagnosis not present

## 2015-11-19 DIAGNOSIS — R410 Disorientation, unspecified: Secondary | ICD-10-CM

## 2015-11-19 DIAGNOSIS — I129 Hypertensive chronic kidney disease with stage 1 through stage 4 chronic kidney disease, or unspecified chronic kidney disease: Secondary | ICD-10-CM | POA: Diagnosis present

## 2015-11-19 DIAGNOSIS — IMO0001 Reserved for inherently not codable concepts without codable children: Secondary | ICD-10-CM

## 2015-11-19 LAB — COMPREHENSIVE METABOLIC PANEL
ALBUMIN: 4.2 g/dL (ref 3.5–5.0)
ALK PHOS: 73 U/L (ref 38–126)
ALT: 10 U/L — AB (ref 17–63)
ANION GAP: 14 (ref 5–15)
AST: 23 U/L (ref 15–41)
BILIRUBIN TOTAL: 0.4 mg/dL (ref 0.3–1.2)
BUN: 14 mg/dL (ref 6–20)
CALCIUM: 9.7 mg/dL (ref 8.9–10.3)
CO2: 21 mmol/L — ABNORMAL LOW (ref 22–32)
CREATININE: 1.28 mg/dL — AB (ref 0.61–1.24)
Chloride: 92 mmol/L — ABNORMAL LOW (ref 101–111)
GFR calc Af Amer: >60 mL/min (ref >60)
GFR calc non Af Amer: 54 mL/min — ABNORMAL LOW (ref >60)
GLUCOSE: 215 mg/dL — AB (ref 65–99)
Potassium: 4.2 mmol/L (ref 3.5–5.1)
Sodium: 127 mmol/L — ABNORMAL LOW (ref 135–145)
TOTAL PROTEIN: 7.4 g/dL (ref 6.5–8.1)

## 2015-11-19 LAB — CBC
HCT: 37.6 % — ABNORMAL LOW (ref 39.0–52.0)
HEMOGLOBIN: 12.2 g/dL — AB (ref 13.0–17.0)
MCH: 26.3 pg (ref 26.0–34.0)
MCHC: 32.4 g/dL (ref 30.0–36.0)
MCV: 81.2 fL (ref 78.0–100.0)
PLATELETS: 243 10*3/uL (ref 150–400)
RBC: 4.63 MIL/uL (ref 4.22–5.81)
RDW: 14.5 % (ref 11.5–15.5)
WBC: 11.8 10*3/uL — ABNORMAL HIGH (ref 4.0–10.5)

## 2015-11-19 LAB — URINE MICROSCOPIC-ADD ON

## 2015-11-19 LAB — RAPID URINE DRUG SCREEN, HOSP PERFORMED
AMPHETAMINES: NOT DETECTED
BENZODIAZEPINES: NOT DETECTED
Barbiturates: NOT DETECTED
COCAINE: NOT DETECTED
OPIATES: NOT DETECTED
Tetrahydrocannabinol: NOT DETECTED

## 2015-11-19 LAB — URINALYSIS, ROUTINE W REFLEX MICROSCOPIC
Bilirubin Urine: NEGATIVE
GLUCOSE, UA: 500 mg/dL — AB
KETONES UR: 15 mg/dL — AB
NITRITE: NEGATIVE
PROTEIN: NEGATIVE mg/dL
Specific Gravity, Urine: 1.017 (ref 1.005–1.030)
pH: 7 (ref 5.0–8.0)

## 2015-11-19 LAB — ETHANOL

## 2015-11-19 LAB — CBG MONITORING, ED: Glucose-Capillary: 209 mg/dL — ABNORMAL HIGH (ref 65–99)

## 2015-11-19 MED ORDER — DEXTROSE 5 % IV SOLN
1.0000 g | Freq: Once | INTRAVENOUS | Status: AC
  Administered 2015-11-19: 1 g via INTRAVENOUS
  Filled 2015-11-19: qty 10

## 2015-11-19 MED ORDER — SODIUM CHLORIDE 0.9 % IV BOLUS (SEPSIS)
1000.0000 mL | Freq: Once | INTRAVENOUS | Status: AC
  Administered 2015-11-19: 1000 mL via INTRAVENOUS

## 2015-11-19 MED ORDER — SODIUM CHLORIDE 0.9 % IV BOLUS (SEPSIS): 500.0000 mL | Freq: Once | INTRAVENOUS | Status: DC

## 2015-11-19 MED ORDER — ONDANSETRON HCL 4 MG/2ML IJ SOLN
4.0000 mg | Freq: Once | INTRAMUSCULAR | Status: AC
  Administered 2015-11-19: 4 mg via INTRAVENOUS
  Filled 2015-11-19: qty 2

## 2015-11-19 NOTE — ED Provider Notes (Signed)
CSN: [id number]     Arrival date & time 11/19/15  1927 History   First MD Initiated Contact with Patient 11/19/15 1944     Chief Complaint  Patient presents with  . Altered Mental Status   Patient is a 70 y.o. male presenting with altered mental status. The history is provided by the patient and the EMS personnel. No language interpreter was used.  Altered Mental Status Presenting symptoms: no disorientation and no partial responsiveness   Severity:  Mild Most recent episode:  Today Episode history:  Single Duration:  2 hours Timing:  Constant Progression:  Unchanged Chronicity:  New Context: nursing home resident   Context: not alcohol use, not head injury and not a recent illness   Associated symptoms: normal movement     Past Medical History  Diagnosis Date  . Diabetes mellitus type II   . Hyperlipemia   . Anxiety   . Alcohol abuse   . HTN (hypertension)   . Cataract   . Alcohol withdrawal seizure ([place])   . Diabetes mellitus without complication ([place])   . Hypertension   . Hearing impairment   . Cataract   . HTN (hypertension), benign 02/26/2014  . Type II or unspecified type diabetes mellitus with neurological manifestations, not stated as uncontrolled 02/26/2014  . Chronic renal insufficiency, stage III (moderate) 02/26/2014  . Alcohol abuse 02/26/2014   Past Surgical History  Procedure Laterality Date  . No past surgeries     No family history on file. Social History  Substance Use Topics  . Smoking status: Current Every Day Smoker    Types: Cigars  . Smokeless tobacco: Current User    Types: Chew  . Alcohol Use: Yes     Comment: 04/2015 I have not drank in over a month "    Review of Systems  Unable to perform ROS: Mental status change    Allergies  Review of patient's allergies indicates no known allergies.  Home Medications   Prior to Admission medications   Medication Sig Start Date End Date Taking? Authorizing Provider  acetaminophen (TYLENOL)  500 MG tablet Take 500 mg by mouth every 8 (eight) hours as needed for moderate pain.   Yes Historical Provider, MD  aspirin (ASPIRIN EC) 81 MG EC tablet Take 81 mg by mouth daily. Swallow whole.   Yes Historical Provider, MD  donepezil (ARICEPT) 10 MG tablet Take 10 mg by mouth at bedtime.   Yes Historical Provider, MD  DULoxetine (CYMBALTA) 60 MG capsule Take 60 mg by mouth daily.   Yes Historical Provider, MD  folic acid (FOLVITE) 1 MG tablet Take 1 tablet (1 mg total) by mouth daily. 05/07/15  Yes Liberty Handy, MD  glipiZIDE (GLUCOTROL) 10 MG tablet Take 10 mg by mouth 2 (two) times daily before a meal.   Yes Historical Provider, MD  lisinopril (PRINIVIL,ZESTRIL) 5 MG tablet Take 5 mg by mouth daily.   Yes Historical Provider, MD  metFORMIN (GLUCOPHAGE) 1000 MG tablet Take 1,000 mg by mouth 2 (two) times daily with a meal.   Yes Historical Provider, MD  ondansetron (ZOFRAN) 4 MG tablet Take 1 tablet (4 mg total) by mouth every 6 (six) hours as needed for nausea. 05/07/15  Yes Liberty Handy, MD  pravastatin (PRAVACHOL) 10 MG tablet Take 10 mg by mouth daily.   Yes Historical Provider, MD  Saccharomyces boulardii (FLORASTOR PO) Take 1 tablet by mouth 2 (two) times daily.   Yes Historical Provider, MD  thiamine 100 MG tablet  Take 1 tablet (100 mg total) by mouth daily. 05/07/15  Yes Liberty Handy, MD   BP 158/88 mmHg  Pulse 74  Temp(Src) 99.6 F (37.6 C)  Resp 22  SpO2 100% Physical Exam  Constitutional: He appears well-developed and well-nourished. No distress.  HENT:  Head: Normocephalic and atraumatic.  Right Ear: External ear normal.  Left Ear: External ear normal.  Eyes: Pupils are equal, round, and reactive to light. Right eye exhibits no discharge. Left eye exhibits no discharge.  Neck: Normal range of motion. No JVD present. No tracheal deviation present.  Cardiovascular: Normal rate, regular rhythm and normal heart sounds.  Exam reveals no friction rub.   No murmur heard. Pulmonary/Chest:  Effort normal and breath sounds normal. No stridor. No respiratory distress. He has no wheezes.  Abdominal: Soft. Bowel sounds are normal. He exhibits no distension. There is no tenderness. There is no rebound and no guarding.  Musculoskeletal: Normal range of motion. He exhibits no edema or tenderness.  Lymphadenopathy:    He has no cervical adenopathy.  Neurological: He is alert. He is disoriented. No cranial nerve deficit or sensory deficit. He exhibits normal muscle tone. Coordination normal.  Skin: Skin is warm and dry. No rash noted. No pallor.  Psychiatric: His speech is delayed. He is slowed.  Nursing note and vitals reviewed.   ED Course  Procedures (including critical care time) Labs Review Labs Reviewed  COMPREHENSIVE METABOLIC PANEL - Abnormal; Notable for the following:    Sodium 127 (*)    Chloride 92 (*)    CO2 21 (*)    Glucose, Bld 215 (*)    Creatinine, Ser 1.28 (*)    ALT 10 (*)    GFR calc non Af Amer 54 (*)    All other components within normal limits  CBC - Abnormal; Notable for the following:    WBC 11.8 (*)    Hemoglobin 12.2 (*)    HCT 37.6 (*)    All other components within normal limits  URINALYSIS, ROUTINE W REFLEX MICROSCOPIC (NOT AT [facility]) - Abnormal; Notable for the following:    APPearance CLOUDY (*)    Glucose, UA 500 (*)    Hgb urine dipstick TRACE (*)    Ketones, ur 15 (*)    Leukocytes, UA LARGE (*)    All other components within normal limits  URINE MICROSCOPIC-ADD ON - Abnormal; Notable for the following:    Squamous Epithelial / LPF 0-5 (*)    Bacteria, UA RARE (*)    All other components within normal limits  CBG MONITORING, ED - Abnormal; Notable for the following:    Glucose-Capillary 209 (*)    All other components within normal limits  ETHANOL  URINE RAPID DRUG SCREEN, HOSP PERFORMED  CBG MONITORING, ED  CBG MONITORING, ED    Imaging Review Dg Chest 2 View  11/19/2015  CLINICAL DATA:  1 hour history of altered mental  status. EXAM: CHEST  2 VIEW COMPARISON:  05/04/2015 FINDINGS: The heart is enlarged but stable. Marked tortuosity, ectasia and calcification of the thoracic aorta. The lungs are clear of acute process. Remote healed rib fractures noted. Advanced degenerative changes involving both shoulders, left greater than right. IMPRESSION: Stable cardiac enlargement. Chronic lung changes but no definite acute pulmonary findings. Electronically Signed   By: Marijo Sanes M.D.   On: 11/19/2015 21:14   Ct Head Wo Contrast  11/19/2015  CLINICAL DATA:  Acute onset altered mental status. Vomiting. Initial encounter. EXAM: CT HEAD  WITHOUT CONTRAST TECHNIQUE: Contiguous axial images were obtained from the base of the skull through the vertex without intravenous contrast. COMPARISON:  CT of the head performed 05/04/2015 FINDINGS: There is no evidence of acute infarction, mass lesion, or intra- or extra-axial hemorrhage on CT. Prominence of the ventricles and sulci reflects mild to moderate cortical volume loss. Mild cerebellar atrophy is noted. Scattered periventricular and subcortical white matter change likely reflects small vessel ischemic microangiopathy. The brainstem and fourth ventricle are within normal limits. The basal ganglia are unremarkable in appearance. The cerebral hemispheres demonstrate grossly normal gray-white differentiation. No mass effect or midline shift is seen. There is no evidence of fracture; visualized osseous structures are unremarkable in appearance. The visualized portions of the orbits are within normal limits. There is opacification of the right maxillary sinus. The remaining paranasal sinuses and mastoid air cells are well-aerated. No significant soft tissue abnormalities are seen. IMPRESSION: 1. No acute intracranial pathology seen on CT. 2. Mild to moderate cortical volume loss and scattered small vessel ischemic microangiopathy. 3. Opacification of the right maxillary sinus. Electronically Signed    By: Garald Balding M.D.   On: 11/19/2015 23:48   I have personally reviewed and evaluated these images and lab results as part of my medical decision-making.   EKG Interpretation None      MDM   Final diagnoses:  Disorientation  UTI (lower urinary tract infection)  Hyponatremia    Patient is a 69 year old male who presents via EMS from nursing home for evaluation of altered mental status began tonight. He has a history of 2 diabetes, alcohol abuse, depression, seizure disorder unspecified. Vomited urinating on himself this evening.  Upon arrival patient afebrile in no acute distress. He had urinated himself and has dried vomit on his mouth and upper chest. He is extremely hard of hearing but denies any current symptoms.  I was unable to contact patient's emergency contact, Briscoe Deutscher at telephone number provided after several attempts. [phone])  Broad workup due to inability to obtain history from patient UA positive for UTI. CMP was sodium 127. Patient with mildly low bicarbonate 21. Creatinine at baseline. White blood cell count 11.8.  Patient given Rocephin for UTI and fluids to help with dehydration. He was given Zofran for nausea.  Chest x-ray with no acute findings.  11:40 PM: Patient re-evaluated following head CT findings. Patient confused and likely not at his baseline. CT head with no acute findings.  12:39 AM: Discussed with internal medicine resident team who will admit patient for altered mental status. They will see patient ED and put in a bed request.  Discussed w/ attending, Dr. Ralene Bathe.    Vira Blanco, MD 11/20/15 0040  Quintella Reichert, MD 11/23/15 [phone]

## 2015-11-19 NOTE — ED Notes (Signed)
Pt arrives via EMS for altered mental status x 1 hour. Typically alert and oriented x4, now altered, not following commands however HOH making patient difficult to assess. Per EMS passed their stroke scale. Can tell RN his first name is Rush Landmark and his birthday is in [birth year]. Emesis x1 PTA. Incontinent of urine x3 PTA.

## 2015-11-19 NOTE — ED Notes (Signed)
Pt now asleep. No neuro deficits appreciated. Hx of seizures.

## 2015-11-20 DIAGNOSIS — N183 Chronic kidney disease, stage 3 (moderate): Secondary | ICD-10-CM | POA: Diagnosis present

## 2015-11-20 DIAGNOSIS — F0280 Dementia in other diseases classified elsewhere without behavioral disturbance: Secondary | ICD-10-CM

## 2015-11-20 DIAGNOSIS — F411 Generalized anxiety disorder: Secondary | ICD-10-CM | POA: Diagnosis present

## 2015-11-20 DIAGNOSIS — E871 Hypo-osmolality and hyponatremia: Secondary | ICD-10-CM | POA: Diagnosis present

## 2015-11-20 DIAGNOSIS — E1149 Type 2 diabetes mellitus with other diabetic neurological complication: Secondary | ICD-10-CM | POA: Diagnosis present

## 2015-11-20 DIAGNOSIS — H9190 Unspecified hearing loss, unspecified ear: Secondary | ICD-10-CM | POA: Diagnosis present

## 2015-11-20 DIAGNOSIS — F329 Major depressive disorder, single episode, unspecified: Secondary | ICD-10-CM

## 2015-11-20 DIAGNOSIS — E785 Hyperlipidemia, unspecified: Secondary | ICD-10-CM

## 2015-11-20 DIAGNOSIS — I129 Hypertensive chronic kidney disease with stage 1 through stage 4 chronic kidney disease, or unspecified chronic kidney disease: Secondary | ICD-10-CM | POA: Diagnosis present

## 2015-11-20 DIAGNOSIS — F17290 Nicotine dependence, other tobacco product, uncomplicated: Secondary | ICD-10-CM | POA: Diagnosis present

## 2015-11-20 DIAGNOSIS — E119 Type 2 diabetes mellitus without complications: Secondary | ICD-10-CM

## 2015-11-20 DIAGNOSIS — R4182 Altered mental status, unspecified: Secondary | ICD-10-CM | POA: Diagnosis not present

## 2015-11-20 DIAGNOSIS — D509 Iron deficiency anemia, unspecified: Secondary | ICD-10-CM | POA: Diagnosis present

## 2015-11-20 DIAGNOSIS — I10 Essential (primary) hypertension: Secondary | ICD-10-CM

## 2015-11-20 DIAGNOSIS — G9340 Encephalopathy, unspecified: Secondary | ICD-10-CM | POA: Diagnosis present

## 2015-11-20 DIAGNOSIS — A059 Bacterial foodborne intoxication, unspecified: Secondary | ICD-10-CM | POA: Diagnosis present

## 2015-11-20 DIAGNOSIS — G309 Alzheimer's disease, unspecified: Secondary | ICD-10-CM

## 2015-11-20 DIAGNOSIS — N390 Urinary tract infection, site not specified: Principal | ICD-10-CM

## 2015-11-20 DIAGNOSIS — R9431 Abnormal electrocardiogram [ECG] [EKG]: Secondary | ICD-10-CM

## 2015-11-20 DIAGNOSIS — B9689 Other specified bacterial agents as the cause of diseases classified elsewhere: Secondary | ICD-10-CM | POA: Diagnosis not present

## 2015-11-20 DIAGNOSIS — Z7982 Long term (current) use of aspirin: Secondary | ICD-10-CM | POA: Diagnosis not present

## 2015-11-20 DIAGNOSIS — E860 Dehydration: Secondary | ICD-10-CM | POA: Diagnosis present

## 2015-11-20 DIAGNOSIS — F1021 Alcohol dependence, in remission: Secondary | ICD-10-CM

## 2015-11-20 DIAGNOSIS — R1110 Vomiting, unspecified: Secondary | ICD-10-CM

## 2015-11-20 DIAGNOSIS — F0281 Dementia in other diseases classified elsewhere with behavioral disturbance: Secondary | ICD-10-CM | POA: Diagnosis present

## 2015-11-20 DIAGNOSIS — E1122 Type 2 diabetes mellitus with diabetic chronic kidney disease: Secondary | ICD-10-CM | POA: Diagnosis present

## 2015-11-20 LAB — BASIC METABOLIC PANEL
ANION GAP: 11 (ref 5–15)
ANION GAP: 11 (ref 5–15)
Anion gap: 11 (ref 5–15)
BUN: 13 mg/dL (ref 6–20)
BUN: 14 mg/dL (ref 6–20)
BUN: 15 mg/dL (ref 6–20)
CALCIUM: 9.3 mg/dL (ref 8.9–10.3)
CALCIUM: 9.5 mg/dL (ref 8.9–10.3)
CO2: 24 mmol/L (ref 22–32)
CO2: 26 mmol/L (ref 22–32)
CO2: 28 mmol/L (ref 22–32)
CREATININE: 1.33 mg/dL — AB (ref 0.61–1.24)
Calcium: 10 mg/dL (ref 8.9–10.3)
Chloride: 98 mmol/L — ABNORMAL LOW (ref 101–111)
Chloride: 100 mmol/L — ABNORMAL LOW (ref 101–111)
Chloride: 100 mmol/L — ABNORMAL LOW (ref 101–111)
Creatinine, Ser: 1.2 mg/dL (ref 0.61–1.24)
Creatinine, Ser: 1.4 mg/dL — ABNORMAL HIGH (ref 0.61–1.24)
GFR calc Af Amer: >60 mL/min (ref >60)
GFR calc Af Amer: >60 mL/min (ref >60)
GFR, EST AFRICAN AMERICAN: 56 mL/min — AB (ref >60)
GFR, EST NON AFRICAN AMERICAN: 59 mL/min — AB (ref >60)
GFR, EST NON AFRICAN AMERICAN: 49 mL/min — AB (ref >60)
GFR, EST NON AFRICAN AMERICAN: 52 mL/min — AB (ref >60)
GLUCOSE: 135 mg/dL — AB (ref 65–99)
GLUCOSE: 189 mg/dL — AB (ref 65–99)
Glucose, Bld: 143 mg/dL — ABNORMAL HIGH (ref 65–99)
Potassium: 4.2 mmol/L (ref 3.5–5.1)
Potassium: 4.4 mmol/L (ref 3.5–5.1)
Potassium: 4.5 mmol/L (ref 3.5–5.1)
SODIUM: 133 mmol/L — AB (ref 135–145)
SODIUM: 137 mmol/L (ref 135–145)
SODIUM: 139 mmol/L (ref 135–145)

## 2015-11-20 LAB — IRON AND TIBC
IRON: 94 ug/dL (ref 45–182)
Saturation Ratios: 23 % (ref 17.9–39.5)
TIBC: 417 ug/dL (ref 250–450)
UIBC: 323 ug/dL

## 2015-11-20 LAB — CBG MONITORING, ED
GLUCOSE-CAPILLARY: 116 mg/dL — AB (ref 65–99)
GLUCOSE-CAPILLARY: 161 mg/dL — AB (ref 65–99)

## 2015-11-20 LAB — FOLATE: Folate: 44.8 ng/mL (ref >5.9)

## 2015-11-20 LAB — OSMOLALITY: Osmolality: 286 mOsm/kg (ref 275–295)

## 2015-11-20 LAB — TROPONIN I
TROPONIN I: 0.04 ng/mL — AB (ref <0.031)
TROPONIN I: 0.03 ng/mL (ref <0.031)
Troponin I: 0.05 ng/mL — ABNORMAL HIGH (ref <0.031)

## 2015-11-20 LAB — GLUCOSE, CAPILLARY: Glucose-Capillary: 168 mg/dL — ABNORMAL HIGH (ref 65–99)

## 2015-11-20 LAB — SODIUM, URINE, RANDOM: SODIUM UR: 28 mmol/L

## 2015-11-20 LAB — VITAMIN B12: VITAMIN B 12: 220 pg/mL (ref 180–914)

## 2015-11-20 LAB — FERRITIN: FERRITIN: 8 ng/mL — AB (ref 24–336)

## 2015-11-20 LAB — OSMOLALITY, URINE: OSMOLALITY UR: 106 mosm/kg — AB (ref 300–900)

## 2015-11-20 MED ORDER — ONDANSETRON HCL 4 MG/2ML IJ SOLN: 4.0000 mg | Freq: Three times a day (TID) | INTRAMUSCULAR | Status: DC | PRN

## 2015-11-20 MED ORDER — INSULIN ASPART 100 UNIT/ML ~~LOC~~ SOLN
0.0000 [IU] | Freq: Three times a day (TID) | SUBCUTANEOUS | Status: DC
  Administered 2015-11-20: 2 [IU] via SUBCUTANEOUS
  Administered 2015-11-21: 1 [IU] via SUBCUTANEOUS
  Administered 2015-11-21: 2 [IU] via SUBCUTANEOUS
  Filled 2015-11-20: qty 1

## 2015-11-20 MED ORDER — PRAVASTATIN SODIUM 20 MG PO TABS
10.0000 mg | ORAL_TABLET | Freq: Every day | ORAL | Status: DC
  Administered 2015-11-20 – 2015-11-21 (×2): 10 mg via ORAL
  Filled 2015-11-20 (×2): qty 1

## 2015-11-20 MED ORDER — HALOPERIDOL LACTATE 5 MG/ML IJ SOLN
5.0000 mg | Freq: Once | INTRAMUSCULAR | Status: AC
  Administered 2015-11-20: 5 mg via INTRAMUSCULAR
  Filled 2015-11-20: qty 1

## 2015-11-20 MED ORDER — DEXTROSE 5 % IV SOLN
1.0000 g | Freq: Every day | INTRAVENOUS | Status: DC
  Administered 2015-11-20: 1 g via INTRAVENOUS
  Filled 2015-11-20 (×3): qty 10

## 2015-11-20 MED ORDER — INSULIN ASPART 100 UNIT/ML ~~LOC~~ SOLN: 0.0000 [IU] | Freq: Every day | SUBCUTANEOUS | Status: DC

## 2015-11-20 MED ORDER — DULOXETINE HCL 60 MG PO CPEP
60.0000 mg | ORAL_CAPSULE | Freq: Every day | ORAL | Status: DC
  Administered 2015-11-20 – 2015-11-21 (×2): 60 mg via ORAL
  Filled 2015-11-20 (×3): qty 1

## 2015-11-20 MED ORDER — ASPIRIN EC 81 MG PO TBEC
81.0000 mg | DELAYED_RELEASE_TABLET | Freq: Every day | ORAL | Status: DC
  Administered 2015-11-20 – 2015-11-21 (×2): 81 mg via ORAL
  Filled 2015-11-20 (×2): qty 1

## 2015-11-20 MED ORDER — ENOXAPARIN SODIUM 40 MG/0.4ML ~~LOC~~ SOLN
40.0000 mg | SUBCUTANEOUS | Status: DC
  Administered 2015-11-20 – 2015-11-21 (×2): 40 mg via SUBCUTANEOUS
  Filled 2015-11-20 (×2): qty 0.4

## 2015-11-20 MED ORDER — SODIUM CHLORIDE 0.9 % IV SOLN
INTRAVENOUS | Status: DC
  Administered 2015-11-20: 06:00:00 via INTRAVENOUS

## 2015-11-20 MED ORDER — DONEPEZIL HCL 10 MG PO TABS
10.0000 mg | ORAL_TABLET | Freq: Every day | ORAL | Status: DC
  Filled 2015-11-20: qty 1

## 2015-11-20 MED ORDER — LISINOPRIL 5 MG PO TABS
5.0000 mg | ORAL_TABLET | Freq: Every day | ORAL | Status: DC
  Administered 2015-11-20 – 2015-11-21 (×2): 5 mg via ORAL
  Filled 2015-11-20 (×2): qty 1

## 2015-11-20 NOTE — Progress Notes (Signed)
Admission note:  Arrival Method: ED stretcher  Mental Orientation: alert & oriented x 4; episodes of cofusions; very heard of hearing Telemetry: box # 6 applied and CCMD notified  Assessment: in progress  IV: left FA Pain: pt denies  Safety Measures: Patient Handbook has been given, and discussed the Fall Prevention worksheet. Left at bedside  Admission: Completed and admission orders have been written  6E Orientation: Patient has been oriented to the unit, staff and to the room.     Haila Dena [employer], RN [employer] Phone [phone]

## 2015-11-20 NOTE — ED Notes (Signed)
Patient CBG was 116, The Nurse was informed.

## 2015-11-20 NOTE — Progress Notes (Addendum)
Subjective:  Patient was seen and examined this morning. Patient admits to burning with urination today and yesterday. He denies fever, chills, cough, shortness of breath, chest pain, nausea or vomiting. He doesn't remember vomiting yesterday. He knows he lives in [place], but cannot tell me where. He denies smoking tobacco, but does admit to chewing it. He can't remember the last time he drank alcohol as it was so long ago.    Objective: Vital signs in last 24 hours: Filed Vitals:   11/20/15 1345 11/20/15 1400 11/20/15 1415 11/20/15 1430  BP: 131/75 112/79 123/66 135/81  Pulse: 61 84 64 64  Temp:      Resp: 15 21 15 22   SpO2: 97% 91% 99% 98%   Weight change:  No intake or output data in the 24 hours ending 11/20/15 1458  General: Vital signs reviewed.  Patient is in no acute distress and cooperative with exam.  Cardiovascular: RRR, S1 normal, S2 normal Pulmonary/Chest: Clear to auscultation bilaterally, no wheezes, rales, or rhonchi. Abdominal: Soft, non-tender, non-distended, BS + Extremities: No lower extremity edema bilaterally Neurological: Awake, alert, &Oriented x2 (not oriented to date), moving all extremities without obvious focal neuro deficit. Skin: Warm, dry and intact. No rashes or erythema. Psychiatric: Pleasant. Cognition and memory are abnormal.   Lab Results: Basic Metabolic Panel:  Recent Labs Lab 11/20/15 0551 11/20/15 0952  NA 133* 137  K 4.2 4.4  CL 98* 100*  CO2 24 26  GLUCOSE 135* 189*  BUN 13 14  CREATININE 1.20 1.40*  CALCIUM 9.3 9.5   Liver Function Tests:  Recent Labs Lab 11/19/15 1945  AST 23  ALT 10*  ALKPHOS 73  BILITOT 0.4  PROT 7.4  ALBUMIN 4.2   CBC:  Recent Labs Lab 11/19/15 1945  WBC 11.8*  HGB 12.2*  HCT 37.6*  MCV 81.2  PLT 243   Cardiac Enzymes:  Recent Labs Lab 11/20/15 0551 11/20/15 1040  TROPONINI 0.05* 0.04*   CBG:  Recent Labs Lab 11/19/15 1939 11/20/15 0809 11/20/15 1225  GLUCAP 209*  116* 161*   Urine Drug Screen: Drugs of Abuse     Component Value Date/Time   LABOPIA NONE DETECTED 11/19/2015 2058   COCAINSCRNUR NONE DETECTED 11/19/2015 2058   LABBENZ NONE DETECTED 11/19/2015 2058   AMPHETMU NONE DETECTED 11/19/2015 2058   THCU NONE DETECTED 11/19/2015 2058   LABBARB NONE DETECTED 11/19/2015 2058    Alcohol Level:  Recent Labs Lab 11/19/15 1945  ETH <5   Urinalysis:  Recent Labs Lab 11/19/15 2058  COLORURINE YELLOW  LABSPEC 1.017  PHURINE 7.0  GLUCOSEU 500*  HGBUR TRACE*  BILIRUBINUR NEGATIVE  KETONESUR 15*  PROTEINUR NEGATIVE  NITRITE NEGATIVE  LEUKOCYTESUR LARGE*   Studies/Results: Dg Chest 2 View  11/19/2015  CLINICAL DATA:  1 hour history of altered mental status. EXAM: CHEST  2 VIEW COMPARISON:  05/04/2015 FINDINGS: The heart is enlarged but stable. Marked tortuosity, ectasia and calcification of the thoracic aorta. The lungs are clear of acute process. Remote healed rib fractures noted. Advanced degenerative changes involving both shoulders, left greater than right. IMPRESSION: Stable cardiac enlargement. Chronic lung changes but no definite acute pulmonary findings. Electronically Signed   By: Marijo Sanes M.D.   On: 11/19/2015 21:14   Ct Head Wo Contrast  11/19/2015  CLINICAL DATA:  Acute onset altered mental status. Vomiting. Initial encounter. EXAM: CT HEAD WITHOUT CONTRAST TECHNIQUE: Contiguous axial images were obtained from the base of the skull through the vertex without intravenous  contrast. COMPARISON:  CT of the head performed 05/04/2015 FINDINGS: There is no evidence of acute infarction, mass lesion, or intra- or extra-axial hemorrhage on CT. Prominence of the ventricles and sulci reflects mild to moderate cortical volume loss. Mild cerebellar atrophy is noted. Scattered periventricular and subcortical white matter change likely reflects small vessel ischemic microangiopathy. The brainstem and fourth ventricle are within normal limits.  The basal ganglia are unremarkable in appearance. The cerebral hemispheres demonstrate grossly normal gray-white differentiation. No mass effect or midline shift is seen. There is no evidence of fracture; visualized osseous structures are unremarkable in appearance. The visualized portions of the orbits are within normal limits. There is opacification of the right maxillary sinus. The remaining paranasal sinuses and mastoid air cells are well-aerated. No significant soft tissue abnormalities are seen. IMPRESSION: 1. No acute intracranial pathology seen on CT. 2. Mild to moderate cortical volume loss and scattered small vessel ischemic microangiopathy. 3. Opacification of the right maxillary sinus. Electronically Signed   By: Garald Balding M.D.   On: 11/19/2015 23:48   Medications: I have reviewed the patient's current medications. Prior to Admission:  (Not in a hospital admission) Scheduled Meds: . aspirin EC  81 mg Oral Daily  . donepezil  10 mg Oral QHS  . DULoxetine  60 mg Oral Daily  . enoxaparin (LOVENOX) injection  40 mg Subcutaneous Q24H  . insulin aspart  0-5 Units Subcutaneous QHS  . insulin aspart  0-9 Units Subcutaneous TID WC  . lisinopril  5 mg Oral Daily  . pravastatin  10 mg Oral Daily   Continuous Infusions: . cefTRIAXone (ROCEPHIN)  IV 1 g (11/20/15 [id number])   PRN Meds:.ondansetron (ZOFRAN) IV Assessment/Plan: Active Problems:   Altered mental status  Mr. Pittinger is a 70 yo male with PMHx of T2DM, HTN, HLD, Alochol Abuse, CKD III, and hearing impairment who presented to the ED on 11/20/15 after patient was found at his assisted living facility confused, shaking and with vomiting on himself and on the floor.  Acute Encephalopathy: Likely secondary to UTI. Patient was seen around 1 am and was only oriented to person and not followng commands. Later this morning, patient is oriented to self and place (hospital) and can answer basic questions and follow our commands. Apparently,  per the ALF, patients is normally oriented x 3 at baseline. He appears to be improving, but likely still off of his baseline. Acute Encephalopathy may be due to underlying UTI as UA showed some signs of infectious manifestations, WBC 11.8 and patient admits to dysuria. Other infectious etiologies seems less likely. Toxic etiologies seems less likely as UDS was negative and patient cannot remember the last time he had alcohol (years ago). Patient's presentation, physical exam, and CT head are suggestive of a TIA or CVA. ACS as cause of his altered mentation is unlikely given lack of chest pain, however, patient does have more prominent biphasic TWI in V4, V5, V6 and new biphasic TWI in II, aVF as compared to previous EKG. Initial troponin 0.05 with repeat 0.04. Orthostatic vital signs negative, except for increase HR 80>68>100; holding IVF given correction of sodium as outlined below. -Ceftriaxone 1 gram IV QD -Urine Culture pending -Repeat CBC/BMET tomorrow am -Trend one more troponin  Acute Hypovolemic Hyponatremia: Sodium on admission was 127 at 745 pm, which improved to 133 after IVF hydration. Recheck at 10 am was 137. Serum Osm 286. Hyponatremia in the setting of a normal serum osmolality is usually associated with pseudohyponatremia or from  nonconductive irrigation fluids which occurs during procedure such as TURP. Neither of these scenarios seem to fit this patient.  -Discontinue IVF -Repeat STAT BMET -Consider Desmopressin IV if at risk for osmotic demyelination   Chronic Normocytic Anemia: Hgb 12.2 on admission. Vitamin B12 normal, Folate 44.8, Iron normal 94, Saturation ratio 23%, TIBC 417, and Ferritin low at 8. Not a consistent panel, but most consistent with iron-deficiency anemia. Patient has been mildly anemic since 2011.  -Patient will need follow up with gastroenterology for outpatient EGD/colonoscopy to evaluate for iron deficiency anemia -Consider initiation of Ferrous Sulfate  HTN:  Patient is on lisinopril 5 mg daily at home. BP 123/74 this morning. -Continue lisinopril 5 mg daily  T2DM: Patient is on glipizide 10 mg BID at home.  -SSI-S -CBG ACHS  Alzheimer's Dementia:  Patient is on Donepezil 10 mg QHS at home. Patient is normally oriented x 3 at baseline.  -Continue Donepezil 10 mg QHS  Depression: Patient is on Duloxetine 60 mg daily at home. Symptoms are stable. -Continue Duloxetine 60 mg dialy  DVT/PE ppx: Lovenox 40 SQ QD  Dispo: Disposition is deferred at this time, awaiting improvement of current medical problems.  Anticipated discharge in approximately 2-3 day(s).   The patient does have a current PCP Shela Leff, MD) and does need an [facility] hospital follow-up appointment after discharge.  The patient does have transportation limitations that hinder transportation to clinic appointments.   LOS: 0 days   Osa Craver, DO PGY-2 Internal Medicine Resident Pager # [phone] 11/20/2015 2:58 PM
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
## 2015-11-20 NOTE — Progress Notes (Signed)
Subjective: Patient was doing well this AM. He is oriented to person and place but not time. He endorses dysuria of 2 days duration. Denies recent alcohol use and cannot recall when he last used alcohol. He states that he does not smoke cigarettes but has chewed tobacco since age 70. He used to work for a [employer]. He denies headache, cough, chest pain, diarrhea, dizziness. He has poignant commentary regarding hospital food.  Objective: Vital signs in last 24 hours: Filed Vitals:   11/20/15 1345 11/20/15 1400 11/20/15 1415 11/20/15 1430  BP: 131/75 112/79 123/66 135/81  Pulse: 61 84 64 64  Temp:      Resp: 15 21 15 22   SpO2: 97% 91% 99% 98%   Weight change:  No intake or output data in the 24 hours ending 11/20/15 1437 BP 135/81 mmHg  Pulse 64  Temp(Src) 99.6 F (37.6 C)  Resp 22  SpO2 98%  General Appearance:    Alert, cooperative, no distress  Head:    Normocephalic, without obvious abnormality, atraumatic  Nose:   Nares normal, no drainage   Throat:   Poor dentition   Neck:  No carotid bruit or JVD present  Back:     Symmetric, no curvature, ROM normal, no CVA tenderness  Lungs:     Clear to auscultation bilaterally, respirations unlabored  Chest wall:    No tenderness or deformity  Heart:    Regular rate and rhythm, S1 and S2 normal, no murmur, rub   or gallop  Abdomen:     Soft, non-tender, bowel sounds active  Extremities:   Extremities normal, atraumatic, no cyanosis or edema  Neurologic:   Normal strength, sensation and reflexes      Throughout, oriented to person and place but not time (it is 1962)   Lab Results:  Basic Metabolic Panel:  Recent Labs  11/20/15 0551 11/20/15 0952  NA 133* 137  K 4.2 4.4  CL 98* 100*  CO2 24 26  GLUCOSE 135* 189*  BUN 13 14  CREATININE 1.20 1.40*  CALCIUM 9.3 9.5   Liver Function Tests:  Recent Labs  11/19/15 1945  AST 23  ALT 10*  ALKPHOS 73  BILITOT 0.4  PROT 7.4  ALBUMIN 4.2   CBC:  Recent Labs   11/19/15 1945  WBC 11.8*  HGB 12.2*  HCT 37.6*  MCV 81.2  PLT 243   Cardiac Enzymes:  Recent Labs  11/20/15 0551 11/20/15 1040  TROPONINI 0.05* 0.04*   CBG:  Recent Labs  11/19/15 1939 11/20/15 0809 11/20/15 1225  GLUCAP 209* 116* 161*   Anemia Panel:  Recent Labs  11/20/15 0551  VITAMINB12 220  FOLATE 44.8  FERRITIN 8*  TIBC 417  IRON 94   Urine Drug Screen: Drugs of Abuse     Component Value Date/Time   LABOPIA NONE DETECTED 11/19/2015 2058   COCAINSCRNUR NONE DETECTED 11/19/2015 2058   LABBENZ NONE DETECTED 11/19/2015 2058   AMPHETMU NONE DETECTED 11/19/2015 2058   THCU NONE DETECTED 11/19/2015 2058   LABBARB NONE DETECTED 11/19/2015 2058    Alcohol Level:  Recent Labs  11/19/15 1945  ETH <5   Urinalysis:  Recent Labs  11/19/15 2058  COLORURINE YELLOW  LABSPEC 1.017  PHURINE 7.0  GLUCOSEU 500*  HGBUR TRACE*  BILIRUBINUR NEGATIVE  KETONESUR 15*  PROTEINUR NEGATIVE  NITRITE NEGATIVE  LEUKOCYTESUR LARGE*   Studies/Results: Dg Chest 2 View  11/19/2015  CLINICAL DATA:  1 hour history of altered mental status. EXAM:  CHEST  2 VIEW COMPARISON:  05/04/2015 FINDINGS: The heart is enlarged but stable. Marked tortuosity, ectasia and calcification of the thoracic aorta. The lungs are clear of acute process. Remote healed rib fractures noted. Advanced degenerative changes involving both shoulders, left greater than right. IMPRESSION: Stable cardiac enlargement. Chronic lung changes but no definite acute pulmonary findings. Electronically Signed   By: Marijo Sanes M.D.   On: 11/19/2015 21:14   Ct Head Wo Contrast  11/19/2015  CLINICAL DATA:  Acute onset altered mental status. Vomiting. Initial encounter. EXAM: CT HEAD WITHOUT CONTRAST TECHNIQUE: Contiguous axial images were obtained from the base of the skull through the vertex without intravenous contrast. COMPARISON:  CT of the head performed 05/04/2015 FINDINGS: There is no evidence of acute  infarction, mass lesion, or intra- or extra-axial hemorrhage on CT. Prominence of the ventricles and sulci reflects mild to moderate cortical volume loss. Mild cerebellar atrophy is noted. Scattered periventricular and subcortical white matter change likely reflects small vessel ischemic microangiopathy. The brainstem and fourth ventricle are within normal limits. The basal ganglia are unremarkable in appearance. The cerebral hemispheres demonstrate grossly normal gray-white differentiation. No mass effect or midline shift is seen. There is no evidence of fracture; visualized osseous structures are unremarkable in appearance. The visualized portions of the orbits are within normal limits. There is opacification of the right maxillary sinus. The remaining paranasal sinuses and mastoid air cells are well-aerated. No significant soft tissue abnormalities are seen. IMPRESSION: 1. No acute intracranial pathology seen on CT. 2. Mild to moderate cortical volume loss and scattered small vessel ischemic microangiopathy. 3. Opacification of the right maxillary sinus. Electronically Signed   By: Garald Balding M.D.   On: 11/19/2015 23:48   Medications: I have reviewed the patient's current medications. Scheduled Meds: . aspirin EC  81 mg Oral Daily  . donepezil  10 mg Oral QHS  . DULoxetine  60 mg Oral Daily  . enoxaparin (LOVENOX) injection  40 mg Subcutaneous Q24H  . insulin aspart  0-5 Units Subcutaneous QHS  . insulin aspart  0-9 Units Subcutaneous TID WC  . lisinopril  5 mg Oral Daily  . pravastatin  10 mg Oral Daily   Continuous Infusions: . cefTRIAXone (ROCEPHIN)  IV 1 g (11/20/15 [id number])   PRN Meds:.ondansetron (ZOFRAN) IV Assessment/Plan: Active Problems:   Altered mental status  William [name] is a 70 yo male with history of HTN, DM Type II, HLD, alcohol abuse, CKD stage III, hearing impairment who presented on 2/21 from his assisted living facility where he was found tremulous on the floor  having vomited on himself in a confused state. Overnight patient was only oriented to person and unable to follow commands however, by this morning and later in the afternoon patients condition improved greatly to the point that he was very conversational. Per ALF at baseline patient is oriented to person, place and time. This acute encephalopathy could be attributed to underlying UTI as UA was notable for leukocyte esterase, patient had dysuria, and WBC 11.8. Withdrawal is less likely as patient denies alcohol use in the last few months-years. CT scan was negative for acute ischemic or hemorrhagic process. Cardiac arrhythmia or injury are in the differential as troponins were 0.04,0.05 and EKG shows new T wave inversions however patient denies chest pain. Hypovolemia or orthostatic syncope are in the differential however less likely as orthostatic vital signs are unremarkable. Hyponatremia could be contributory with Na of 127 on admission.  Acute Encephalopathy secondary to UTI  -  Ceftriaxone 1 gram IV QD -F/u urine culture -Trend troponins  Hypovolemic Hyponatremia: Na on admission 127; improved to 133 follow IV fluids. By 10 AM improved to 137. We are avoiding correcting hyponatremia too quickly to minimize risk of pontine demyelination. -Discontinue IVF -Trend Na  Chronic Normocytic Anemia: Hgb 12.2, Vitamin B12 normal, Folate 44.8, Iron 94, TIBC 417, and Ferritin 8. Iron deficiency anemia in a male this age is concerning for GI bleed secondary to malignancy. We need to perform colonoscopy either as inpatient or arrange for patient to follow up with GI as outpatient. He has not previously followed up with clinic despite concern for anemia thus we want to make sure we arrange for close follow up.  -Colonoscopy inpatient or arrange GI follow up -Consider Ferrous Sulfate  HTN: BP 123/74 in AM. -Continue lisinopril 5 mg daily  T2DM:  - Hold home glipizide  -SSI-S  Alzheimer's Dementia:   -Continue home Donepezil 10 mg QHS  Depression:  -Continue home Duloxetine 60 mg dialy  DVT/PE ppx: Lovenox 40 SQ QD  Dispo: Patient requires inpatient care at this time. If he continues to improve, we can transition to PO abx and look to discharge within 1-2 additional days.  This is a Careers information officer Note.  The care of the patient was discussed with Dr. Marvel Plan and the assessment and plan formulated with their assistance.  Please see their attached note for official documentation of the daily encounter.   LOS: 0 days   [employer], Med Student 11/20/2015, 2:37 PM

## 2015-11-20 NOTE — H&P (Signed)
Date: 11/20/2015               Patient Name:  William [name] MRN: [id number]  DOB: [birth year] Age / Sex: 70 y.o., male   PCP: Shela Leff, MD         Medical Service: Internal Medicine Teaching Service         Attending Physician: Dr. Oval Linsey, MD    First Contact: Lynda Rainwater MS IV Pager: [phone]  Second Contact: Dr. Marvel Plan Pager: [phone]       After Hours (After 5p/  First Contact Pager: [phone]  weekends / holidays): Second Contact Pager: [phone]   Chief Complaint: AMS, vomiting  History of Present Illness: Patient is a 70 year old male with a past medical history of type II diabetes, hypertension, hyperlipidemia, anxiety, alcohol abuse, alcohol withdrawal seizures, stage III CKD, hearing impairment presenting to the hospital via EMS from [facility] assisted living facility for evaluation of altered mental status that began tonight.Staff member at the facility state at around 7 pm yesterday evening patient was found to be very confused, shaking, trembling, and there was vomit all around him and all the floor. States patient is AAO x3 at baseline. Staff member denies any recent illness, sick contacts, diarrhea, respiratory problems, or falls. Denies any recent antibiotic use. She is not aware when the patient had his last seizure at the facility or whether he complained of any pain. However, she does mention that the patient eats food that has been sitting in his room for several days. She is not sure if patient has access to alcoholic beverages.  It is difficult to obtain history from the patient because he is oriented only to person and has a hearing deficit. Patient states he has not been eating or drinking much. He denies having any headaches, focal weakness, fevers, chills, CP, SOB, abdominal pain, or dysuria. No other complaints.   Meds: No current facility-administered medications for this encounter.   Current Outpatient Prescriptions  Medication Sig Dispense  Refill  . acetaminophen (TYLENOL) 500 MG tablet Take 500 mg by mouth every 8 (eight) hours as needed for moderate pain.    Marland Kitchen aspirin (ASPIRIN EC) 81 MG EC tablet Take 81 mg by mouth daily. Swallow whole.    . donepezil (ARICEPT) 10 MG tablet Take 10 mg by mouth at bedtime.    . DULoxetine (CYMBALTA) 60 MG capsule Take 60 mg by mouth daily.    . folic acid (FOLVITE) 1 MG tablet Take 1 tablet (1 mg total) by mouth daily. 30 tablet 1  . glipiZIDE (GLUCOTROL) 10 MG tablet Take 10 mg by mouth 2 (two) times daily before a meal.    . lisinopril (PRINIVIL,ZESTRIL) 5 MG tablet Take 5 mg by mouth daily.    . metFORMIN (GLUCOPHAGE) 1000 MG tablet Take 1,000 mg by mouth 2 (two) times daily with a meal.    . ondansetron (ZOFRAN) 4 MG tablet Take 1 tablet (4 mg total) by mouth every 6 (six) hours as needed for nausea. 20 tablet 0  . pravastatin (PRAVACHOL) 10 MG tablet Take 10 mg by mouth daily.    . Saccharomyces boulardii (FLORASTOR PO) Take 1 tablet by mouth 2 (two) times daily.    Marland Kitchen thiamine 100 MG tablet Take 1 tablet (100 mg total) by mouth daily. 30 tablet 1    Allergies: Allergies as of 11/19/2015  . (No Known Allergies)   Past Medical History  Diagnosis Date  . Diabetes mellitus type II   .  Hyperlipemia   . Anxiety   . Alcohol abuse   . HTN (hypertension)   . Cataract   . Alcohol withdrawal seizure ([place])   . Diabetes mellitus without complication ([place])   . Hypertension   . Hearing impairment   . Cataract   . HTN (hypertension), benign 02/26/2014  . Type II or unspecified type diabetes mellitus with neurological manifestations, not stated as uncontrolled 02/26/2014  . Chronic renal insufficiency, stage III (moderate) 02/26/2014  . Alcohol abuse 02/26/2014   Past Surgical History  Procedure Laterality Date  . No past surgeries     No family history on file.  Family history could not be obtained at this time due to patient's AMS.  Social History   Social History  . Marital Status:  Single    Spouse Name: N/A  . Number of Children: N/A  . Years of Education: N/A   Occupational History  . Not on file.  Occupational history could not be obtained at this time due to patient's AMS.   Social History Main Topics  . Smoking status: Current Every Day Smoker    Types: Cigars  . Smokeless tobacco: Current User    Types: Chew  . Alcohol Use: Yes     Comment: 04/2015 I have not drank in over a month "  . Drug Use: No  . Sexual Activity: Not on file   Other Topics Concern  . Not on file   Social History Narrative   ** Merged History Encounter **        Review of Systems: ROS  Pertinent positives mentioned in HPI. No further review of systems could be obtained from the patient in the setting of AMS.   Physical Exam: Blood pressure 123/74, pulse 69, temperature 99.6 F (37.6 C), resp. rate 14, SpO2 94 %. Physical Exam  Constitutional: No distress.  Disheveled, pleasantly confused elderly Caucasian male lying comfortably in hospital stretcher.  HENT:  Head: Normocephalic and atraumatic.  Mouth/Throat: Oropharynx is clear and moist.  Tongue appears erythematous and glossy.  Eyes: Pupils are equal, round, and reactive to light.  Extraocular movements could not be assessed due to lack of patient cooperation.  Neck: Neck supple. No tracheal deviation present.  Cardiovascular: Normal rate, regular rhythm and intact distal pulses.  Exam reveals no gallop and no friction rub.   No murmur heard. Pulmonary/Chest: Effort normal and breath sounds normal. No respiratory distress. He has no wheezes. He has no rales.  Abdominal: Soft. Bowel sounds are normal. He exhibits no distension. There is no tenderness. There is no rebound and no guarding.  Musculoskeletal: He exhibits no edema or tenderness.  Neurological:  Awake, alert, oriented to person only (name and date of birth). Patient is moving his extremities spontaneously. Strength grossly intact in bilateral upper and  lower extremities.  Skin: Skin is warm and dry. He is not diaphoretic.    Lab results: Basic Metabolic Panel:  Recent Labs  11/19/15 1945  NA 127*  K 4.2  CL 92*  CO2 21*  GLUCOSE 215*  BUN 14  CREATININE 1.28*  CALCIUM 9.7   Liver Function Tests:  Recent Labs  11/19/15 1945  AST 23  ALT 10*  ALKPHOS 73  BILITOT 0.4  PROT 7.4  ALBUMIN 4.2   CBC:  Recent Labs  11/19/15 1945  WBC 11.8*  HGB 12.2*  HCT 37.6*  MCV 81.2  PLT 243   CBG:  Recent Labs  11/19/15 1939  GLUCAP 209*  Urine Drug Screen: Drugs of Abuse     Component Value Date/Time   LABOPIA NONE DETECTED 11/19/2015 2058   COCAINSCRNUR NONE DETECTED 11/19/2015 2058   LABBENZ NONE DETECTED 11/19/2015 2058   AMPHETMU NONE DETECTED 11/19/2015 2058   THCU NONE DETECTED 11/19/2015 2058   LABBARB NONE DETECTED 11/19/2015 2058    Alcohol Level:  Recent Labs  11/19/15 1945  ETH <5   Urinalysis:  Recent Labs  11/19/15 2058  COLORURINE YELLOW  LABSPEC 1.017  PHURINE 7.0  GLUCOSEU 500*  HGBUR TRACE*  BILIRUBINUR NEGATIVE  KETONESUR 15*  PROTEINUR NEGATIVE  NITRITE NEGATIVE  LEUKOCYTESUR LARGE*   Imaging results:  Dg Chest 2 View  11/19/2015  CLINICAL DATA:  1 hour history of altered mental status. EXAM: CHEST  2 VIEW COMPARISON:  05/04/2015 FINDINGS: The heart is enlarged but stable. Marked tortuosity, ectasia and calcification of the thoracic aorta. The lungs are clear of acute process. Remote healed rib fractures noted. Advanced degenerative changes involving both shoulders, left greater than right. IMPRESSION: Stable cardiac enlargement. Chronic lung changes but no definite acute pulmonary findings. Electronically Signed   By: Marijo Sanes M.D.   On: 11/19/2015 21:14   Ct Head Wo Contrast  11/19/2015  CLINICAL DATA:  Acute onset altered mental status. Vomiting. Initial encounter. EXAM: CT HEAD WITHOUT CONTRAST TECHNIQUE: Contiguous axial images were obtained from the base of the  skull through the vertex without intravenous contrast. COMPARISON:  CT of the head performed 05/04/2015 FINDINGS: There is no evidence of acute infarction, mass lesion, or intra- or extra-axial hemorrhage on CT. Prominence of the ventricles and sulci reflects mild to moderate cortical volume loss. Mild cerebellar atrophy is noted. Scattered periventricular and subcortical white matter change likely reflects small vessel ischemic microangiopathy. The brainstem and fourth ventricle are within normal limits. The basal ganglia are unremarkable in appearance. The cerebral hemispheres demonstrate grossly normal gray-white differentiation. No mass effect or midline shift is seen. There is no evidence of fracture; visualized osseous structures are unremarkable in appearance. The visualized portions of the orbits are within normal limits. There is opacification of the right maxillary sinus. The remaining paranasal sinuses and mastoid air cells are well-aerated. No significant soft tissue abnormalities are seen. IMPRESSION: 1. No acute intracranial pathology seen on CT. 2. Mild to moderate cortical volume loss and scattered small vessel ischemic microangiopathy. 3. Opacification of the right maxillary sinus. Electronically Signed   By: Garald Balding M.D.   On: 11/19/2015 23:48    Other results: EKG: NSR, ventricular rate 69 bpm, PVCs, T wave inversions in diffuse leads. The T wave inversions in leads III, aVR, aVF, V4, V5, V6 are new in comparison to prior tracing from 05/05/15.   Assessment & Plan by Problem: Active Problems:   Altered mental status  Acute encephalopathy leading to AMS Likely in the setting of UTI as patient had a dirty UA showing large number of leukocytes. White count elevated at 11.8, however, no signs of systemic infection as vitals are stable. AMS could also possibly be due to hyponatremia as Na was 127 on admission. Patient was found to be shaking and trembling at the nursing home and it is  not clear whether this was due to seizure activity. He does have a history of alcohol abuse and alcohol withdrawal seizures but ethanol level was <5 on admission. Hypoglycemia is less likely to be a cause for AMS as CBG was 209 on admission. CT head showing no acute intracranial pathology. Patient does  have mild to moderate cortical volume loss and scattered small vessel ischemic microangiopathy.  -IV Ceftriaxone 1 g daily for possible UTI -Follow up urine culture -Repeat CXR in morning as patient at risk for aspiration pneumonia from vomiting and AMS.  -Orthostatic vitals   Vomiting Possibly due to hyponatremia as Na was 127 on admission. Food poisoning is also on the diffteential becaause patient has been eating stale foods. His renal function is at baseline and patient was given a 1L bolus of IVF in the ED.  -NS@125  cc/hr -Zofran prn nausea   Hypovolemic hyponatremia Likely in the setting of GI fluid loss from vomiting. Na 127 on admission and chloride low at 92.  -NS@125  cc/hr -Goal is to correct Na by <8 points per 24 hrs  -Follow up am BMP -Check urine osm, urine Na   New T wave inversions on EKG  EKG today showing T wave inversions in several leads which are new in compariwon to prior tracing. Although patient denies having any chest pain, his mental status is altered at present. -Trend troponins -Repeat EKG in am   History of alcohol abuse and withdrawal seizures Blood ethanol <5.  -Will hold off from giving any benzodiazepines due to acute encephalopathy and AMS  Iron deficiency anemia Hgb 12.2 on admission. Physical exam remarkable for glossitis. Iron panel from 04/2015 showing low ferritin (16). Patient is not on any iron supplement at home. -Pending iron panel. If patient continues to have low ferritin, consider starting on an iron supplement.   DM2 Last A1c (6.4) on file is from 12/2013. -SSI -CBG q6 -Hold home medications Metformin and Glipizide as patient is NPO.    HTN BP stable at present. Renal function at baseline.  -Continue Lisinopril 5 mg daily when patient is able to tolerate PO intake.   HLD -Continue Pravastatin 10 mg daily    Depression -Continue Duloxetine 60 mg daily    Alzheimer's disease -Continue Donepezil 10 mg daily  DVt ppx: Lovenox   Diet: NPO for now due to aspiration risk   Code: Full   Dispo: Disposition is deferred at this time, awaiting improvement of current medical problems. Anticipated discharge in approximately 1-2 day(s).   The patient does have a current PCP Shela Leff, MD) and does need an [facility] hospital follow-up appointment after discharge.  The patient does not have transportation limitations that hinder transportation to clinic appointments.  Signed: Shela Leff, MD 11/20/2015, 3:38 AM

## 2015-11-20 NOTE — Progress Notes (Signed)
Internal Medicine Attending  Date: 11/20/2015  Patient name: William [name] Medical record number: [id number] Date of birth: 02/12/1946 Age: 70 y.o. Gender: male  I saw and evaluated the patient. I reviewed the resident's note by Dr. Marvel Plan and I agree with the resident's findings and plans as documented in her progress note.  Please see my H&P dated 11/20/2015 and attached to Dr. Elon Jester H&P dated 11/20/2015 for the specifics of my evaluation, assessment, and plan from earlier today.

## 2015-11-20 NOTE — ED Notes (Signed)
Patient was given a snack and drink, A regular diet ordered for lunch.

## 2015-11-21 DIAGNOSIS — N390 Urinary tract infection, site not specified: Secondary | ICD-10-CM

## 2015-11-21 DIAGNOSIS — B952 Enterococcus as the cause of diseases classified elsewhere: Secondary | ICD-10-CM | POA: Insufficient documentation

## 2015-11-21 LAB — CBC
HCT: 37.9 % — ABNORMAL LOW (ref 39.0–52.0)
Hemoglobin: 12.2 g/dL — ABNORMAL LOW (ref 13.0–17.0)
MCH: 27.4 pg (ref 26.0–34.0)
MCHC: 32.2 g/dL (ref 30.0–36.0)
MCV: 85.2 fL (ref 78.0–100.0)
PLATELETS: 195 10*3/uL (ref 150–400)
RBC: 4.45 MIL/uL (ref 4.22–5.81)
RDW: 15.3 % (ref 11.5–15.5)
WBC: 6.7 10*3/uL (ref 4.0–10.5)

## 2015-11-21 LAB — BASIC METABOLIC PANEL
ANION GAP: 12 (ref 5–15)
BUN: 17 mg/dL (ref 6–20)
CALCIUM: 9.4 mg/dL (ref 8.9–10.3)
CO2: 26 mmol/L (ref 22–32)
Chloride: 101 mmol/L (ref 101–111)
Creatinine, Ser: 1.53 mg/dL — ABNORMAL HIGH (ref 0.61–1.24)
GFR, EST AFRICAN AMERICAN: 51 mL/min — AB (ref >60)
GFR, EST NON AFRICAN AMERICAN: 44 mL/min — AB (ref >60)
Glucose, Bld: 150 mg/dL — ABNORMAL HIGH (ref 65–99)
POTASSIUM: 4.3 mmol/L (ref 3.5–5.1)
SODIUM: 139 mmol/L (ref 135–145)

## 2015-11-21 LAB — GLUCOSE, CAPILLARY
GLUCOSE-CAPILLARY: 155 mg/dL — AB (ref 65–99)
GLUCOSE-CAPILLARY: 96 mg/dL (ref 65–99)
Glucose-Capillary: 144 mg/dL — ABNORMAL HIGH (ref 65–99)

## 2015-11-21 MED ORDER — SULFAMETHOXAZOLE-TRIMETHOPRIM 800-160 MG PO TABS
1.0000 | ORAL_TABLET | Freq: Two times a day (BID) | ORAL | Status: DC
  Administered 2015-11-21: 1 via ORAL
  Filled 2015-11-21: qty 1

## 2015-11-21 MED ORDER — SULFAMETHOXAZOLE-TRIMETHOPRIM 800-160 MG PO TABS: 1.0000 | ORAL_TABLET | Freq: Two times a day (BID) | ORAL | Status: DC

## 2015-11-21 MED ORDER — AMPICILLIN 250 MG PO CAPS: 250.0000 mg | ORAL_CAPSULE | Freq: Four times a day (QID) | ORAL | Status: AC

## 2015-11-21 NOTE — Progress Notes (Signed)
William [name] to be D/C'd Nursing Home per MD order.  Discussed prescriptions and follow up appointments with the patient. Prescriptions given to patient, medication list explained in detail. Pt verbalized understanding.    Medication List    TAKE these medications        acetaminophen 500 MG tablet  Commonly known as:  TYLENOL  Take 500 mg by mouth every 8 (eight) hours as needed for moderate pain.     ampicillin 250 MG capsule  Commonly known as:  PRINCIPEN  Take 1 capsule (250 mg total) by mouth 4 (four) times daily.     aspirin EC 81 MG EC tablet  Generic drug:  aspirin  Take 81 mg by mouth daily. Swallow whole.     donepezil 10 MG tablet  Commonly known as:  ARICEPT  Take 10 mg by mouth at bedtime.     DULoxetine 60 MG capsule  Commonly known as:  CYMBALTA  Take 60 mg by mouth daily.     FLORASTOR PO  Take 1 tablet by mouth 2 (two) times daily.     folic acid 1 MG tablet  Commonly known as:  FOLVITE  Take 1 tablet (1 mg total) by mouth daily.     glipiZIDE 10 MG tablet  Commonly known as:  GLUCOTROL  Take 10 mg by mouth 2 (two) times daily before a meal.     lisinopril 5 MG tablet  Commonly known as:  PRINIVIL,ZESTRIL  Take 5 mg by mouth daily.     metFORMIN 1000 MG tablet  Commonly known as:  GLUCOPHAGE  Take 1,000 mg by mouth 2 (two) times daily with a meal.     ondansetron 4 MG tablet  Commonly known as:  ZOFRAN  Take 1 tablet (4 mg total) by mouth every 6 (six) hours as needed for nausea.     pravastatin 10 MG tablet  Commonly known as:  PRAVACHOL  Take 10 mg by mouth daily.     thiamine 100 MG tablet  Take 1 tablet (100 mg total) by mouth daily.        Filed Vitals:   11/21/15 0800 11/21/15 1654  BP: 118/66 124/71  Pulse: 70 58  Temp: 98.3 F (36.8 C) 97.6 F (36.4 C)  Resp: 18 17    Skin clean, dry and intact without evidence of skin break down, no evidence of skin tears noted. Patient did not have and IV catheter placed. Pt denies  pain at this time. No complaints noted. Telemetry was removed.   Patient to be escorted via stretcher, and D/C home via ambulance.  Retta Mac BSN, RN
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
## 2015-11-21 NOTE — Progress Notes (Signed)
Patient resting O2 Sat 98% on room air. Patient ambulated though the hall had an O2 sat 94% HR went up to 127.  Theodosia Paling, RN 11/21/2015 4:23 PM

## 2015-11-21 NOTE — Progress Notes (Signed)
Subjective:  Patient was seen and examined this morning. He is similar to his previous presentation yesterday, but more oriented. He denies any complaints including fever, nausea, vomiting, dysuria, shortness of breath or chest pain. Patient complains about the food.   Overnight, patient was very combative trying to hit the nurses and the medical residents. He was cussing and yelling racial comments at the nurses. He was not encephalopathic as he was answering all questions appropriately. His underlying dementia may have been playing a role. Patient was given Haldol 5 mg IM once.   Objective: Vital signs in last 24 hours: Filed Vitals:   11/20/15 1630 11/20/15 2054 11/21/15 0611 11/21/15 0800  BP: 138/78 116/75 125/72 118/66  Pulse: 74 73 65 70  Temp: 97.7 F (36.5 C) 98.9 F (37.2 C) 98.7 F (37.1 C) 98.3 F (36.8 C)  TempSrc: Oral  Oral Oral  Resp: 22 22 20 18   Height: 5\' 7"  (1.702 m)     Weight: 193 lb 9 oz (87.8 kg) 190 lb (86.183 kg)    SpO2: 98% 99% 100% 99%   Weight change:   Intake/Output Summary (Last 24 hours) at 11/21/15 1128 Last data filed at 11/21/15 0900  Gross per 24 hour  Intake    360 ml  Output    752 ml  Net   -392 ml   General: Vital signs reviewed.  Patient is elderly, in no acute distress and cooperative with exam.  Cardiovascular: RRR, S1 normal, S2 normal Pulmonary/Chest: Clear to auscultation bilaterally, no wheezes, rales, or rhonchi. Abdominal: Soft, non-tender, non-distended Extremities: No lower extremity edema bilaterally Neurological: A&O x 2 (oriented to person, place and month, but not the year), moving all extremities, no focal deficits. Psychiatric: Pleasant, similar to yesterday, laughing; however, does cuss about the food. Cognition and memory are abnormal.   Lab Results: Basic Metabolic Panel:  Recent Labs Lab 11/20/15 1537 11/21/15 0811  NA 139 139  K 4.5 4.3  CL 100* 101  CO2 28 26  GLUCOSE 143* 150*  BUN 15 17    CREATININE 1.33* 1.53*  CALCIUM 10.0 9.4   Liver Function Tests:  Recent Labs Lab 11/19/15 1945  AST 23  ALT 10*  ALKPHOS 73  BILITOT 0.4  PROT 7.4  ALBUMIN 4.2   CBC:  Recent Labs Lab 11/19/15 1945 11/21/15 0811  WBC 11.8* 6.7  HGB 12.2* 12.2*  HCT 37.6* 37.9*  MCV 81.2 85.2  PLT 243 195   Cardiac Enzymes:  Recent Labs Lab 11/20/15 0551 11/20/15 1040 11/20/15 1725  TROPONINI 0.05* 0.04* 0.03   CBG:  Recent Labs Lab 11/19/15 1939 11/20/15 0809 11/20/15 1225 11/20/15 2052 11/21/15 0943  GLUCAP 209* 116* 161* 168* 155*   Anemia Panel:  Recent Labs Lab 11/20/15 0551  VITAMINB12 220  FOLATE 44.8  FERRITIN 8*  TIBC 417  IRON 94   Urine Drug Screen: Drugs of Abuse     Component Value Date/Time   LABOPIA NONE DETECTED 11/19/2015 2058   COCAINSCRNUR NONE DETECTED 11/19/2015 2058   LABBENZ NONE DETECTED 11/19/2015 2058   AMPHETMU NONE DETECTED 11/19/2015 2058   THCU NONE DETECTED 11/19/2015 2058   LABBARB NONE DETECTED 11/19/2015 2058    Alcohol Level:  Recent Labs Lab 11/19/15 1945  ETH <5   Urinalysis:  Recent Labs Lab 11/19/15 2058  COLORURINE YELLOW  LABSPEC 1.017  PHURINE 7.0  GLUCOSEU 500*  HGBUR TRACE*  BILIRUBINUR NEGATIVE  KETONESUR 15*  PROTEINUR NEGATIVE  NITRITE NEGATIVE  LEUKOCYTESUR  LARGE*   Studies/Results: Dg Chest 2 View  11/19/2015  CLINICAL DATA:  1 hour history of altered mental status. EXAM: CHEST  2 VIEW COMPARISON:  05/04/2015 FINDINGS: The heart is enlarged but stable. Marked tortuosity, ectasia and calcification of the thoracic aorta. The lungs are clear of acute process. Remote healed rib fractures noted. Advanced degenerative changes involving both shoulders, left greater than right. IMPRESSION: Stable cardiac enlargement. Chronic lung changes but no definite acute pulmonary findings. Electronically Signed   By: Marijo Sanes M.D.   On: 11/19/2015 21:14   Ct Head Wo Contrast  11/19/2015  CLINICAL  DATA:  Acute onset altered mental status. Vomiting. Initial encounter. EXAM: CT HEAD WITHOUT CONTRAST TECHNIQUE: Contiguous axial images were obtained from the base of the skull through the vertex without intravenous contrast. COMPARISON:  CT of the head performed 05/04/2015 FINDINGS: There is no evidence of acute infarction, mass lesion, or intra- or extra-axial hemorrhage on CT. Prominence of the ventricles and sulci reflects mild to moderate cortical volume loss. Mild cerebellar atrophy is noted. Scattered periventricular and subcortical white matter change likely reflects small vessel ischemic microangiopathy. The brainstem and fourth ventricle are within normal limits. The basal ganglia are unremarkable in appearance. The cerebral hemispheres demonstrate grossly normal gray-white differentiation. No mass effect or midline shift is seen. There is no evidence of fracture; visualized osseous structures are unremarkable in appearance. The visualized portions of the orbits are within normal limits. There is opacification of the right maxillary sinus. The remaining paranasal sinuses and mastoid air cells are well-aerated. No significant soft tissue abnormalities are seen. IMPRESSION: 1. No acute intracranial pathology seen on CT. 2. Mild to moderate cortical volume loss and scattered small vessel ischemic microangiopathy. 3. Opacification of the right maxillary sinus. Electronically Signed   By: Garald Balding M.D.   On: 11/19/2015 23:48   Medications:  I have reviewed the patient's current medications. Prior to Admission:  Prescriptions prior to admission  Medication Sig Dispense Refill Last Dose  . acetaminophen (TYLENOL) 500 MG tablet Take 500 mg by mouth every 8 (eight) hours as needed for moderate pain.   unknown at unknown  . aspirin (ASPIRIN EC) 81 MG EC tablet Take 81 mg by mouth daily. Swallow whole.   11/19/2015 at Unknown time  . donepezil (ARICEPT) 10 MG tablet Take 10 mg by mouth at bedtime.    11/18/2015 at Unknown time  . DULoxetine (CYMBALTA) 60 MG capsule Take 60 mg by mouth daily.   11/19/2015 at Unknown time  . folic acid (FOLVITE) 1 MG tablet Take 1 tablet (1 mg total) by mouth daily. 30 tablet 1 11/19/2015 at Unknown time  . glipiZIDE (GLUCOTROL) 10 MG tablet Take 10 mg by mouth 2 (two) times daily before a meal.   11/19/2015 at Unknown time  . lisinopril (PRINIVIL,ZESTRIL) 5 MG tablet Take 5 mg by mouth daily.   11/19/2015 at Unknown time  . metFORMIN (GLUCOPHAGE) 1000 MG tablet Take 1,000 mg by mouth 2 (two) times daily with a meal.   11/19/2015 at Unknown time  . ondansetron (ZOFRAN) 4 MG tablet Take 1 tablet (4 mg total) by mouth every 6 (six) hours as needed for nausea. 20 tablet 0 unknown at unknown  . pravastatin (PRAVACHOL) 10 MG tablet Take 10 mg by mouth daily.   11/19/2015 at Unknown time  . Saccharomyces boulardii (FLORASTOR PO) Take 1 tablet by mouth 2 (two) times daily.   11/19/2015 at Unknown time  . thiamine 100 MG tablet  Take 1 tablet (100 mg total) by mouth daily. 30 tablet 1 11/19/2015 at Unknown time   Scheduled Meds: . aspirin EC  81 mg Oral Daily  . donepezil  10 mg Oral QHS  . DULoxetine  60 mg Oral Daily  . enoxaparin (LOVENOX) injection  40 mg Subcutaneous Q24H  . insulin aspart  0-5 Units Subcutaneous QHS  . insulin aspart  0-9 Units Subcutaneous TID WC  . lisinopril  5 mg Oral Daily  . pravastatin  10 mg Oral Daily  . sulfamethoxazole-trimethoprim  1 tablet Oral Q12H   Continuous Infusions:  PRN Meds:.ondansetron (ZOFRAN) IV Assessment/Plan: Principal Problem:   UTI (lower urinary tract infection) Active Problems:   Anxiety state   Hearing impairment   Essential hypertension   T2DM (type 2 diabetes mellitus) (HCC)   Chronic renal insufficiency, stage III (moderate)   Acute encephalopathy   Hyponatremia   Iron deficiency anemia  Mr. Barba is a 70 yo male with PMHx of T2DM, HTN, HLD, Alochol Abuse, CKD III, and hearing impairment who presented  to the ED on 11/20/15 after patient was found at his assisted living facility confused, shaking and with vomiting on himself and on the floor.  UTI: Acute encephalopathy has improved and is likely secondary to UTI as other causes have been ruled out. Patient appears to be at his baseline this morning, AAOx2-3 (knows month, not year) and answers all questions appropriately. He has been afebrile. Patient was seen in the ED on 10/10/15 for similar combative symptoms that were noted last night and he was found to have a UTI growing E. Coli, pan-sensitive. He has been discharged from the ED without antibiotics, but later when the Urine Culture came back, Keflex 500 mg BID x 14 days was prescribed. Urine culture here is still pending. Patient can be transitioned to oral Bactrim BID and likely discharged back to his ALF today. Patient pulled out his IV access.  -Discharge to ALF -Stop Ceftriaxone 1 gram IV QD -Bactrim DS BID (2/22>>2/27) to complete a total 7 day antibiotic course -Follow up Urine Culture   Acute Hypovolemic Hyponatremia: Improved to 13 yesterday after IVF, stable this morning at 139. Patient has been of IVF since yesterday afternoon. -Resolved.   CKD Stage III: Patient's baseline creatinine has been about 1.3-1.5 since 2013. Creatinine on admission 1.4>1.3>1.5, about his baseline. There is no dose adjustment for Bactrim for a GFR >30. -Monitor BMET as outpatient  Chronic Iron-Deficiency Anemia: Hgb 12.2 on admission. Patient has been mildly anemic since 2011. We will set patient up with an outpatient GI referral for further work up for iron deficiency anemia.  -Ambulatory referral to GI  HTN: Patient is on lisinopril 5 mg daily at home. BP 134/76 this morning. -Continue lisinopril 5 mg daily  T2DM: Patient is on glipizide 10 mg BID and Metformin 1000 mg BID at home. CBGs have been 140-160s.  -SSI-S -CBG ACHS -Discharge to ALF on glipizide and metformin  Alzheimer's Dementia:  Patient is on Donepezil 10 mg QHS at home. Patient is normally oriented x 3 at baseline. This morning, patient appears to be at baseline, but not oriented to year.  -Continue Donepezil 10 mg QHS  Depression: Patient is on Duloxetine 60 mg daily at home.  -Continue Duloxetine 60 mg dialy  DVT/PE ppx: Lovenox 40 SQ QD  Dispo: Anticipated discharge in approximately 0 day(s).   The patient does have a current PCP Shela Leff, MD) and does need an [facility] hospital follow-up appointment  after discharge.  The patient does have transportation limitations that hinder transportation to clinic appointments.    LOS: 1 day   Osa Craver, DO PGY-2 Internal Medicine Resident Pager # [phone] 11/21/2015 11:28 AM

## 2015-11-21 NOTE — Clinical Social Work Note (Signed)
Patient's granddaughter, Helene Kelp and significant other, Izora Gala notified of patient returning back to [facility].   Clinical Social Worker will sign off for now as social work intervention is no longer needed. Please consult [place] again if new need arises.  Glendon Axe, MSW, LCSWA [phone] 11/21/2015 5:40 PM

## 2015-11-21 NOTE — Clinical Social Work Note (Signed)
Per MD patient ready to DC back to [facility]. RN, patient/family, and facility notified of patient's DC. RN given number for report. DC packet on patient's chart. Ambulance transport requested for patient. CSW unable to complete psychosocial with patient/family due to same day discharge. CSW signing off at this time.   Liz Beach MSW, [place], [place], JI:[phone]

## 2015-11-21 NOTE — Progress Notes (Signed)
Subjective:  Patient was combative overnight threatening to punch staff members and yelling racial and sexist comments to the nurses and residents. He was alert and oriented x 2 which is his baseline. He pulled his IV and the IV team was unable to start a new one due to behavioral issues.   This AM he was pleasant and stated he wants to go home. Denies incontinence and endorses improvement in dysuria compared to yesterday.   Objective: Vital signs in last 24 hours: Filed Vitals:   11/20/15 1630 11/20/15 2054 11/21/15 0611 11/21/15 0800  BP: 138/78 116/75 125/72 118/66  Pulse: 74 73 65 70  Temp: 97.7 F (36.5 C) 98.9 F (37.2 C) 98.7 F (37.1 C) 98.3 F (36.8 C)  TempSrc: Oral  Oral Oral  Resp: 22 22 20 18   Height: 5\' 7"  (1.702 m)     Weight: 87.8 kg (193 lb 9 oz) 86.183 kg (190 lb)    SpO2: 98% 99% 100% 99%   Weight change:   Intake/Output Summary (Last 24 hours) at 11/21/15 1138 Last data filed at 11/21/15 0900  Gross per 24 hour  Intake    360 ml  Output    752 ml  Net   -392 ml   General Appearance:   Alert, cooperative, no distress  Head:   Normocephalic, without obvious abnormality, atraumatic  Nose:  Nares normal, no drainage   Throat:  Poor dentition   Neck: No carotid bruit or JVD present  Back:   Symmetric, no curvature, ROM normal, no CVA tenderness  Lungs:   Clear to auscultation bilaterally, respirations unlabored  Chest wall:   No tenderness or deformity  Heart:   Regular rate and rhythm, S1 and S2 normal, no murmur, rub  or gallop  Abdomen:   Soft, non-tender, bowel sounds active  Extremities:  Extremities normal, atraumatic, no cyanosis or edema  Neurologic:  Normal strength, sensation and reflexes   Throughout, oriented to person and place but not time (1967        Lab Results: Basic Metabolic Panel:  Recent Labs  11/20/15 1537 11/21/15 0811  NA 139 139  K 4.5 4.3  CL 100* 101  CO2 28 26  GLUCOSE  143* 150*  BUN 15 17  CREATININE 1.33* 1.53*  CALCIUM 10.0 9.4   Liver Function Tests:  Recent Labs  11/19/15 1945  AST 23  ALT 10*  ALKPHOS 73  BILITOT 0.4  PROT 7.4  ALBUMIN 4.2   CBC:  Recent Labs  11/19/15 1945 11/21/15 0811  WBC 11.8* 6.7  HGB 12.2* 12.2*  HCT 37.6* 37.9*  MCV 81.2 85.2  PLT 243 195   Cardiac Enzymes:  Recent Labs  11/20/15 0551 11/20/15 1040 11/20/15 1725  TROPONINI 0.05* 0.04* 0.03   CBG:  Recent Labs  11/19/15 1939 11/20/15 0809 11/20/15 1225 11/20/15 2052 11/21/15 0943  GLUCAP 209* 116* 161* 168* 155*   Anemia Panel:  Recent Labs  11/20/15 0551  VITAMINB12 220  FOLATE 44.8  FERRITIN 8*  TIBC 417  IRON 94   Urine Drug Screen: Drugs of Abuse     Component Value Date/Time   LABOPIA NONE DETECTED 11/19/2015 2058   COCAINSCRNUR NONE DETECTED 11/19/2015 2058   LABBENZ NONE DETECTED 11/19/2015 2058   AMPHETMU NONE DETECTED 11/19/2015 2058   THCU NONE DETECTED 11/19/2015 2058   LABBARB NONE DETECTED 11/19/2015 2058    Alcohol Level:  Recent Labs  11/19/15 [place] <5   Urinalysis:  Recent Labs  11/19/15 2058  COLORURINE YELLOW  LABSPEC 1.017  PHURINE 7.0  GLUCOSEU 500*  HGBUR TRACE*  BILIRUBINUR NEGATIVE  KETONESUR 15*  PROTEINUR NEGATIVE  NITRITE NEGATIVE  LEUKOCYTESUR LARGE*   Studies/Results: Dg Chest 2 View  11/19/2015  CLINICAL DATA:  1 hour history of altered mental status. EXAM: CHEST  2 VIEW COMPARISON:  05/04/2015 FINDINGS: The heart is enlarged but stable. Marked tortuosity, ectasia and calcification of the thoracic aorta. The lungs are clear of acute process. Remote healed rib fractures noted. Advanced degenerative changes involving both shoulders, left greater than right. IMPRESSION: Stable cardiac enlargement. Chronic lung changes but no definite acute pulmonary findings. Electronically Signed   By: Marijo Sanes M.D.   On: 11/19/2015 21:14   Ct Head Wo Contrast  11/19/2015  CLINICAL  DATA:  Acute onset altered mental status. Vomiting. Initial encounter. EXAM: CT HEAD WITHOUT CONTRAST TECHNIQUE: Contiguous axial images were obtained from the base of the skull through the vertex without intravenous contrast. COMPARISON:  CT of the head performed 05/04/2015 FINDINGS: There is no evidence of acute infarction, mass lesion, or intra- or extra-axial hemorrhage on CT. Prominence of the ventricles and sulci reflects mild to moderate cortical volume loss. Mild cerebellar atrophy is noted. Scattered periventricular and subcortical white matter change likely reflects small vessel ischemic microangiopathy. The brainstem and fourth ventricle are within normal limits. The basal ganglia are unremarkable in appearance. The cerebral hemispheres demonstrate grossly normal gray-white differentiation. No mass effect or midline shift is seen. There is no evidence of fracture; visualized osseous structures are unremarkable in appearance. The visualized portions of the orbits are within normal limits. There is opacification of the right maxillary sinus. The remaining paranasal sinuses and mastoid air cells are well-aerated. No significant soft tissue abnormalities are seen. IMPRESSION: 1. No acute intracranial pathology seen on CT. 2. Mild to moderate cortical volume loss and scattered small vessel ischemic microangiopathy. 3. Opacification of the right maxillary sinus. Electronically Signed   By: Garald Balding M.D.   On: 11/19/2015 23:48   Medications: I have reviewed the patient's current medications. Scheduled Meds: . aspirin EC  81 mg Oral Daily  . donepezil  10 mg Oral QHS  . DULoxetine  60 mg Oral Daily  . enoxaparin (LOVENOX) injection  40 mg Subcutaneous Q24H  . insulin aspart  0-5 Units Subcutaneous QHS  . insulin aspart  0-9 Units Subcutaneous TID WC  . lisinopril  5 mg Oral Daily  . pravastatin  10 mg Oral Daily  . sulfamethoxazole-trimethoprim  1 tablet Oral Q12H   Continuous Infusions:  None PRN Meds:.ondansetron (ZOFRAN) IV Assessment/Plan: Principal Problem:   UTI (lower urinary tract infection) Active Problems:   Anxiety state   Hearing impairment   Essential hypertension   T2DM (type 2 diabetes mellitus) (HCC)   Chronic renal insufficiency, stage III (moderate)   Acute encephalopathy   Hyponatremia   Iron deficiency anemia  Please note this assessment and plan is written in the format of a hospital course as patient will be discharged today.  William [name] is a 70 yo M with history of history of HTN, DM Type II, HLD, alcohol abuse, CKD stage III, hearing impairment who presented on 2/21 from his assisted living facility where he was found tremulous on the floor having vomited on himself in a confused state. Per ALF at baseline patient is oriented to person, place and time. On admission patient was only oriented to person and unable to follow commands. He was given fluids  and IV antibiotics and was conversational in the AM. He was further evaluated for the potential cause of his acute encephalopathy. We primarily suspect his acute encephalopathy was caused by an underlying UTI since patient had dysuria, UA with positive leukocyte esterase  and WBC 11.8. Patient has history of alcohol abuse but withdrawal was less likely as patient denied alcohol use in the last few months-years. CT scan was negative for acute ischemic or hemorrhagic process. Cardiac arrhythmia or injury were originally differential as troponins were 0.04,0.05 and EKG showed new T wave inversions however patient denied chest pain. Orthostatic vitals were within normal limits. Patient had hypovolemeic hyponatremia with admission sodium of 127.    Acute Encephalopathy secondary to UTI: Patient initially given Ceftriaxone 1 gram IV QD. The night prior to discharge patient had an episode of combativeness and inappropriate remarks to staff but was still at baseline in terms of orientation. He removed his IV and  it could not be replaced so we accelerated transition to oral Bactrim BID. When patient previously had a UTI and presented similarly in 09/2015 his urine culture grew pan-sevsitive E. Coli thus we feel Bactrim will provide appropriate coverage.  - Discharge to ALF on Bactrim BID for a total 7 day course ending 2/27.  Hypovolemic Hyponatremia: Admitted with Na 12 on admission, corrected to 137 with IV fluids. Na this AM stable at 139. - Resolved. No follow up needed.  Chronic Normocytic Anemia: Hgb 12.2, Vitamin B12 normal, Folate 44.8, Iron 94, TIBC 417, and Ferritin 8. This iron deficiency anemia is concerning for GI bleeding thus we recommend colonoscopy. Patient has previously delayed seeking care for this issue thus recommend close follow up. - Ambulatory GI referral made - Recommend outpatient colonoscopy - Recommend consider iron supplementation  Stage III CKD: No intervention while inpatient as patient remained at baseline renal function.  HTN: BP stable while inpatient -Continue lisinopril 5 mg daily  T2DM: Home glipizide held while inpatient. - Restart home glipizide   Alzheimer's Dementia:  -Continue home Donepezil 10 mg QHS  Depression:  -Continue home Duloxetine 60 mg dialy  DVT/PE ppx: Lovenox 40 SQ QD while inpatient. No outpatient anticoagulation necessary.  Dispo: Patient appropriate for discharge. Follow up arranged in our clinic.  This is a Careers information officer Note.  The care of the patient was discussed with Dr. Marvel Plan and the assessment and plan formulated with their assistance.  Please see their attached note for official documentation of the daily encounter.   LOS: 1 day   [employer], Med Student 11/21/2015, 11:38 AM

## 2015-11-21 NOTE — Progress Notes (Signed)
Internal Medicine Attending  Date: 11/21/2015  Patient name: William [name] Medical record number: [id number] Date of birth: [birth year] Age: 70 y.o. Gender: male  I saw and evaluated the patient. I reviewed the resident's note by Dr. Marvel Plan and I agree with the resident's findings and plans as documented in his progress note.  When seen on rounds this morning Mr. Duty appeared to be at his described baseline. This is with treatment of his urinary tract infection. Last night he became combative but was not confused per reports. As noted in other documentation he had a similar presentation in January that also was felt to be related to a urinary tract infection. As he appears to be at baseline and responding well to the antibiotics he is stable for discharge back to the skilled nursing facility today.

## 2015-11-21 NOTE — Discharge Summary (Signed)
Name: William [name] MRN: [id number] DOB: 10-Nov-1945 70 y.o. PCP: Shela Leff, MD  Date of Admission: 11/19/2015  7:27 PM Date of Discharge: 11/21/2015 Attending Physician: Oval Linsey, MD  Discharge Diagnosis:  Principal Problem:   UTI (lower urinary tract infection) Active Problems:   Anxiety state   Hearing impairment   Essential hypertension   T2DM (type 2 diabetes mellitus) (HCC)   Chronic renal insufficiency, stage III (moderate)   Acute encephalopathy   Hyponatremia   Iron deficiency anemia   Enterococcus UTI  Discharge Medications:   Medication List    TAKE these medications        acetaminophen 500 MG tablet  Commonly known as:  TYLENOL  Take 500 mg by mouth every 8 (eight) hours as needed for moderate pain.     ampicillin 250 MG capsule  Commonly known as:  PRINCIPEN  Take 1 capsule (250 mg total) by mouth 4 (four) times daily.     aspirin EC 81 MG EC tablet  Generic drug:  aspirin  Take 81 mg by mouth daily. Swallow whole.     donepezil 10 MG tablet  Commonly known as:  ARICEPT  Take 10 mg by mouth at bedtime.     DULoxetine 60 MG capsule  Commonly known as:  CYMBALTA  Take 60 mg by mouth daily.     FLORASTOR PO  Take 1 tablet by mouth 2 (two) times daily.     folic acid 1 MG tablet  Commonly known as:  FOLVITE  Take 1 tablet (1 mg total) by mouth daily.     glipiZIDE 10 MG tablet  Commonly known as:  GLUCOTROL  Take 10 mg by mouth 2 (two) times daily before a meal.     lisinopril 5 MG tablet  Commonly known as:  PRINIVIL,ZESTRIL  Take 5 mg by mouth daily.     metFORMIN 1000 MG tablet  Commonly known as:  GLUCOPHAGE  Take 1,000 mg by mouth 2 (two) times daily with a meal.     ondansetron 4 MG tablet  Commonly known as:  ZOFRAN  Take 1 tablet (4 mg total) by mouth every 6 (six) hours as needed for nausea.     pravastatin 10 MG tablet  Commonly known as:  PRAVACHOL  Take 10 mg by mouth daily.     thiamine 100 MG tablet   Take 1 tablet (100 mg total) by mouth daily.        Disposition and follow-up:   William [name] was discharged from [facility] in Good condition.  At the hospital follow up visit please address:  UTI: Please complete Bactrim 1 pills twice a day until 11/26/15. Please follow up urine culture sensitivities.   Iron Deficiency Anemia: Need GI follow up for EGD/Colonoscopy.   2.  Labs / imaging needed at time of follow-up: Gastroenterology referral place, please ensure follow up, BMET  3.  Pending labs/ test needing follow-up: Urine Culture  Follow-up Appointments: Follow-up Information    Follow up with Jenetta Downer, MD On 12/05/2015.   Specialty:  Internal Medicine   Why:  10:45 am   Contact information:   [place] [id number] [phone]       Discharge Instructions: Discharge Instructions    Ambulatory referral to Gastroenterology    Complete by:  As directed   Iron deficiency anemia  What is the reason for referral?:  Colonoscopy     Diet - low sodium heart  healthy    Complete by:  As directed      Increase activity slowly    Complete by:  As directed            Consultations:  None  Procedures Performed:  Dg Chest 2 View  11/19/2015  CLINICAL DATA:  1 hour history of altered mental status. EXAM: CHEST  2 VIEW COMPARISON:  05/04/2015 FINDINGS: The heart is enlarged but stable. Marked tortuosity, ectasia and calcification of the thoracic aorta. The lungs are clear of acute process. Remote healed rib fractures noted. Advanced degenerative changes involving both shoulders, left greater than right. IMPRESSION: Stable cardiac enlargement. Chronic lung changes but no definite acute pulmonary findings. Electronically Signed   By: Marijo Sanes M.D.   On: 11/19/2015 21:14   Ct Head Wo Contrast  11/19/2015  CLINICAL DATA:  Acute onset altered mental status. Vomiting. Initial encounter. EXAM: CT HEAD WITHOUT CONTRAST TECHNIQUE:  Contiguous axial images were obtained from the base of the skull through the vertex without intravenous contrast. COMPARISON:  CT of the head performed 05/04/2015 FINDINGS: There is no evidence of acute infarction, mass lesion, or intra- or extra-axial hemorrhage on CT. Prominence of the ventricles and sulci reflects mild to moderate cortical volume loss. Mild cerebellar atrophy is noted. Scattered periventricular and subcortical white matter change likely reflects small vessel ischemic microangiopathy. The brainstem and fourth ventricle are within normal limits. The basal ganglia are unremarkable in appearance. The cerebral hemispheres demonstrate grossly normal gray-white differentiation. No mass effect or midline shift is seen. There is no evidence of fracture; visualized osseous structures are unremarkable in appearance. The visualized portions of the orbits are within normal limits. There is opacification of the right maxillary sinus. The remaining paranasal sinuses and mastoid air cells are well-aerated. No significant soft tissue abnormalities are seen. IMPRESSION: 1. No acute intracranial pathology seen on CT. 2. Mild to moderate cortical volume loss and scattered small vessel ischemic microangiopathy. 3. Opacification of the right maxillary sinus. Electronically Signed   By: Garald Balding M.D.   On: 11/19/2015 23:48    Admission HPI: Patient is a 70 year old male with a past medical history of type II diabetes, hypertension, hyperlipidemia, anxiety, alcohol abuse, alcohol withdrawal seizures, stage III CKD, hearing impairment presenting to the hospital via EMS from [facility] assisted living facility for evaluation of altered mental status that began tonight.Staff member at the facility state at around 7 pm yesterday evening patient was found to be very confused, shaking, trembling, and there was vomit all around him and all the floor. States patient is AAO x3 at baseline. Staff member denies any recent  illness, sick contacts, diarrhea, respiratory problems, or falls. Denies any recent antibiotic use. She is not aware when the patient had his last seizure at the facility or whether he complained of any pain. However, she does mention that the patient eats food that has been sitting in his room for several days. She is not sure if patient has access to alcoholic beverages.  It is difficult to obtain history from the patient because he is oriented only to person and has a hearing deficit. Patient states he has not been eating or drinking much. He denies having any headaches, focal weakness, fevers, chills, CP, SOB, abdominal pain, or dysuria. No other complaints.   Hospital Course by problem list: Principal Problem:   UTI (lower urinary tract infection) Active Problems:   Anxiety state   Hearing impairment   Essential hypertension   T2DM (type  2 diabetes mellitus) ([place])   Chronic renal insufficiency, stage III (moderate)   Acute encephalopathy   Hyponatremia   Iron deficiency anemia   Enterococcus UTI   Acute Encephalopathy: Acute Encephalopathy was likely due to underlying UTI as UA showed some signs of infectious manifestations, WBC 11.8 and patient admits to dysuria. Other infectious etiologies seems less likely. Toxic etiologies seems less likely as UDS was negative and patient cannot remember the last time he had alcohol (years ago). Patient's presentation, physical exam, and CT head are suggestive of a TIA or CVA. ACS as cause of his altered mentation is unlikely given lack of chest pain, however, patient does have more prominent biphasic TWI in V4, V5, V6 and new biphasic TWI in II, aVF as compared to previous EKG. Initial troponin 0.05 which downtrended to 0.04>0.03. Patient was seen in the ED on 10/10/15 for combative symptoms and was found to have a UTI growing E. Coli, pan-sensitive. He has been discharged from the ED without antibiotics, but later when the Urine Culture came back, Keflex  500 mg BID x 14 days was prescribed. Urine culture here notable for growth of enterococcus thus we transitioned patient to Ampicillin 250mg  qid PO per pharmacy recommendation given patient's stage III CKD. Patient will complete a 7 day course on 11/26/15. Please follow up urine culture sensitivities.  Acute Hypovolemic Hyponatremia: Sodium on admission was 127 which resolved after IVF.  CKD Stage III: Patient's baseline creatinine has been about 1.3-1.5 since 2013. Creatinine on admission 1.4>1.3>1.5, about his baseline. Monitor BMET as outpatient.  Chronic Normocytic Anemia: Hgb 12.2 on admission. Vitamin B12 normal, Folate 44.8, Iron normal 94, Saturation ratio 23%, TIBC 417, and Ferritin low at 8. These results are most consistent with iron-deficiency anemia. Patient has been mildly anemic since 2011. Patient will need follow up with gastroenterology for outpatient EGD/colonoscopy to evaluate for iron deficiency anemia. We have placed an ambulatory referral to GI, please ensure follow up.  HTN: Patient is on lisinopril 5 mg daily at home. BP were well controlled during admission on this dose.  T2DM: Patient is on glipizide10 mg BID and Metformin 1000 mg BID at home. Glucose controlled on SSI while admitted and discharge on home regimen.  Alzheimer's Dementia: Patient is on Donepezil 10 mg QHS at home. This was continued on discharge.  Depression: Patient is on Duloxetine 60 mg daily at home. This was continue on discharge.  Discharge Vitals:   BP 118/66 mmHg  Pulse 70  Temp(Src) 98.3 F (36.8 C) (Oral)  Resp 18  Ht 5\' 7"  (1.702 m)  Wt 190 lb (86.183 kg)  BMI 29.75 kg/m2  SpO2 99%  Discharge Labs:  Results for orders placed or performed during the hospital encounter of 11/19/15 (from the past 24 hour(s))  Troponin I     Status: None   Collection Time: 11/20/15  5:25 PM  Result Value Ref Range   Troponin I 0.03 <0.031 ng/mL  Glucose, capillary     Status: Abnormal   Collection  Time: 11/20/15  8:52 PM  Result Value Ref Range   Glucose-Capillary 168 (H) 65 - 99 mg/dL  CBC     Status: Abnormal   Collection Time: 11/21/15  8:11 AM  Result Value Ref Range   WBC 6.7 4.0 - 10.5 K/uL   RBC 4.45 4.22 - 5.81 MIL/uL   Hemoglobin 12.2 (L) 13.0 - 17.0 g/dL   HCT 37.9 (L) 39.0 - 52.0 %   MCV 85.2 78.0 - 100.0 fL  MCH 27.4 26.0 - 34.0 pg   MCHC 32.2 30.0 - 36.0 g/dL   RDW 15.3 11.5 - 15.5 %   Platelets 195 150 - 400 K/uL  Basic metabolic panel     Status: Abnormal   Collection Time: 11/21/15  8:11 AM  Result Value Ref Range   Sodium 139 135 - 145 mmol/L   Potassium 4.3 3.5 - 5.1 mmol/L   Chloride 101 101 - 111 mmol/L   CO2 26 22 - 32 mmol/L   Glucose, Bld 150 (H) 65 - 99 mg/dL   BUN 17 6 - 20 mg/dL   Creatinine, Ser 1.53 (H) 0.61 - 1.24 mg/dL   Calcium 9.4 8.9 - 10.3 mg/dL   GFR calc non Af Amer 44 (L) >60 mL/min   GFR calc Af Amer 51 (L) >60 mL/min   Anion gap 12 5 - 15  Glucose, capillary     Status: Abnormal   Collection Time: 11/21/15  9:43 AM  Result Value Ref Range   Glucose-Capillary 155 (H) 65 - 99 mg/dL  Glucose, capillary     Status: Abnormal   Collection Time: 11/21/15 11:43 AM  Result Value Ref Range   Glucose-Capillary 144 (H) 65 - 99 mg/dL    Signed: Osa Craver, DO PGY-2 Internal Medicine Resident Pager # [phone] 11/21/2015 4:17 PM

## 2015-11-21 NOTE — Progress Notes (Signed)
Patient pulled out IV at the beginning of shift. RN placed order in for IV team consult. IV RN was not able to place IV because patient was very combative and cursing at IV RN and charge RN. Patient was moved to camera room [phone]) for closer observation and safety. Patient continued to be combative, physically and verbally abusive. Patient called this RN a "brown eyed bitch", charge nurse a "black ass bitch", he also told this RN "you wouldn't survive if you lived during my time, I would have hung your bony ass from your legs and swung you by the grapevine." Patient grabbed this RN's arm and pulled her towards him. He also put up his fist indicating that he will punch the charge RN. Security was called to patient's room. Patient continued to yell and curse. MD notified. MD came to see patient and was verbally abusive towards MD. Patient stated that he was going to punch the nurses and they keep getting it started with him. Patient even put up a fist and threatened to punch MD. MD advised to give patient Haldol and to not bother him anymore for the rest of the shift. When asked about his medications, MD advised this RN to hold off on patient's medications for the night. MD orders acknowledged and implemented. RN will continue to monitor patient.  Ermalinda Memos, RN

## 2015-11-21 NOTE — Discharge Instructions (Addendum)
UTI: Please complete antibiotic (ampicillin) four times per day (every 6 hours) starting this afternoon until 11/26/15.  Please follow up urine culture sensitivities.   Iron Deficiency Anemia: Need GI follow up for EGD/Colonoscopy.

## 2015-11-23 LAB — URINE CULTURE: Culture: >=100000

## 2015-11-29 DIAGNOSIS — I10 Essential (primary) hypertension: Secondary | ICD-10-CM | POA: Diagnosis not present

## 2015-11-29 DIAGNOSIS — E1165 Type 2 diabetes mellitus with hyperglycemia: Secondary | ICD-10-CM | POA: Diagnosis not present

## 2015-11-29 DIAGNOSIS — R21 Rash and other nonspecific skin eruption: Secondary | ICD-10-CM | POA: Diagnosis not present

## 2015-11-29 DIAGNOSIS — N390 Urinary tract infection, site not specified: Secondary | ICD-10-CM | POA: Diagnosis not present

## 2015-12-04 ENCOUNTER — Telehealth: Payer: Self-pay | Admitting: Internal Medicine

## 2015-12-04 NOTE — Telephone Encounter (Signed)
APPT REMINDER CANN, NO ANSWER, NO VOICE MAIL

## 2015-12-05 ENCOUNTER — Ambulatory Visit: Payer: Medicare Other | Admitting: Internal Medicine

## 2015-12-15 DIAGNOSIS — I129 Hypertensive chronic kidney disease with stage 1 through stage 4 chronic kidney disease, or unspecified chronic kidney disease: Secondary | ICD-10-CM | POA: Diagnosis not present

## 2015-12-15 DIAGNOSIS — G40909 Epilepsy, unspecified, not intractable, without status epilepticus: Secondary | ICD-10-CM | POA: Diagnosis not present

## 2015-12-15 DIAGNOSIS — F329 Major depressive disorder, single episode, unspecified: Secondary | ICD-10-CM | POA: Diagnosis not present

## 2015-12-15 DIAGNOSIS — F419 Anxiety disorder, unspecified: Secondary | ICD-10-CM | POA: Diagnosis not present

## 2015-12-15 DIAGNOSIS — Z9181 History of falling: Secondary | ICD-10-CM | POA: Diagnosis not present

## 2015-12-15 DIAGNOSIS — E1122 Type 2 diabetes mellitus with diabetic chronic kidney disease: Secondary | ICD-10-CM | POA: Diagnosis not present

## 2015-12-15 DIAGNOSIS — N183 Chronic kidney disease, stage 3 (moderate): Secondary | ICD-10-CM | POA: Diagnosis not present

## 2015-12-19 DIAGNOSIS — F329 Major depressive disorder, single episode, unspecified: Secondary | ICD-10-CM | POA: Diagnosis not present

## 2015-12-19 DIAGNOSIS — G40909 Epilepsy, unspecified, not intractable, without status epilepticus: Secondary | ICD-10-CM | POA: Diagnosis not present

## 2015-12-19 DIAGNOSIS — F419 Anxiety disorder, unspecified: Secondary | ICD-10-CM | POA: Diagnosis not present

## 2015-12-19 DIAGNOSIS — R8290 Unspecified abnormal findings in urine: Secondary | ICD-10-CM | POA: Diagnosis not present

## 2015-12-19 DIAGNOSIS — E1122 Type 2 diabetes mellitus with diabetic chronic kidney disease: Secondary | ICD-10-CM | POA: Diagnosis not present

## 2015-12-19 DIAGNOSIS — N183 Chronic kidney disease, stage 3 (moderate): Secondary | ICD-10-CM | POA: Diagnosis not present

## 2015-12-19 DIAGNOSIS — I129 Hypertensive chronic kidney disease with stage 1 through stage 4 chronic kidney disease, or unspecified chronic kidney disease: Secondary | ICD-10-CM | POA: Diagnosis not present

## 2015-12-20 DIAGNOSIS — N390 Urinary tract infection, site not specified: Secondary | ICD-10-CM | POA: Diagnosis not present

## 2015-12-20 DIAGNOSIS — E1165 Type 2 diabetes mellitus with hyperglycemia: Secondary | ICD-10-CM | POA: Diagnosis not present

## 2015-12-26 DIAGNOSIS — N183 Chronic kidney disease, stage 3 (moderate): Secondary | ICD-10-CM | POA: Diagnosis not present

## 2015-12-26 DIAGNOSIS — I129 Hypertensive chronic kidney disease with stage 1 through stage 4 chronic kidney disease, or unspecified chronic kidney disease: Secondary | ICD-10-CM | POA: Diagnosis not present

## 2015-12-26 DIAGNOSIS — F329 Major depressive disorder, single episode, unspecified: Secondary | ICD-10-CM | POA: Diagnosis not present

## 2015-12-26 DIAGNOSIS — E1122 Type 2 diabetes mellitus with diabetic chronic kidney disease: Secondary | ICD-10-CM | POA: Diagnosis not present

## 2015-12-26 DIAGNOSIS — F419 Anxiety disorder, unspecified: Secondary | ICD-10-CM | POA: Diagnosis not present

## 2015-12-26 DIAGNOSIS — G40909 Epilepsy, unspecified, not intractable, without status epilepticus: Secondary | ICD-10-CM | POA: Diagnosis not present

## 2015-12-29 ENCOUNTER — Encounter (HOSPITAL_COMMUNITY): Payer: Self-pay | Admitting: Emergency Medicine

## 2015-12-29 ENCOUNTER — Observation Stay (HOSPITAL_COMMUNITY)
Admission: EM | Admit: 2015-12-29 | Discharge: 2016-01-02 | Disposition: A | Discharge status: Skilled Nursing Facility | Payer: Medicare Other | Attending: Oncology | Admitting: Oncology

## 2015-12-29 DIAGNOSIS — G40409 Other generalized epilepsy and epileptic syndromes, not intractable, without status epilepticus: Secondary | ICD-10-CM | POA: Diagnosis not present

## 2015-12-29 DIAGNOSIS — R4182 Altered mental status, unspecified: Secondary | ICD-10-CM | POA: Diagnosis present

## 2015-12-29 DIAGNOSIS — R5383 Other fatigue: Secondary | ICD-10-CM | POA: Insufficient documentation

## 2015-12-29 DIAGNOSIS — F1010 Alcohol abuse, uncomplicated: Secondary | ICD-10-CM | POA: Insufficient documentation

## 2015-12-29 DIAGNOSIS — G40909 Epilepsy, unspecified, not intractable, without status epilepticus: Secondary | ICD-10-CM | POA: Diagnosis not present

## 2015-12-29 DIAGNOSIS — S199XXA Unspecified injury of neck, initial encounter: Secondary | ICD-10-CM | POA: Diagnosis not present

## 2015-12-29 DIAGNOSIS — R569 Unspecified convulsions: Secondary | ICD-10-CM | POA: Diagnosis not present

## 2015-12-29 DIAGNOSIS — F0280 Dementia in other diseases classified elsewhere without behavioral disturbance: Secondary | ICD-10-CM | POA: Diagnosis not present

## 2015-12-29 DIAGNOSIS — I517 Cardiomegaly: Secondary | ICD-10-CM | POA: Insufficient documentation

## 2015-12-29 DIAGNOSIS — M542 Cervicalgia: Secondary | ICD-10-CM | POA: Diagnosis not present

## 2015-12-29 DIAGNOSIS — N183 Chronic kidney disease, stage 3 (moderate): Secondary | ICD-10-CM | POA: Diagnosis not present

## 2015-12-29 DIAGNOSIS — R159 Full incontinence of feces: Secondary | ICD-10-CM | POA: Insufficient documentation

## 2015-12-29 DIAGNOSIS — Z9119 Patient's noncompliance with other medical treatment and regimen: Secondary | ICD-10-CM | POA: Insufficient documentation

## 2015-12-29 DIAGNOSIS — F10939 Alcohol use, unspecified with withdrawal, unspecified: Secondary | ICD-10-CM | POA: Insufficient documentation

## 2015-12-29 DIAGNOSIS — G309 Alzheimer's disease, unspecified: Secondary | ICD-10-CM | POA: Diagnosis not present

## 2015-12-29 DIAGNOSIS — F17290 Nicotine dependence, other tobacco product, uncomplicated: Secondary | ICD-10-CM | POA: Insufficient documentation

## 2015-12-29 DIAGNOSIS — Z9114 Patient's other noncompliance with medication regimen: Secondary | ICD-10-CM | POA: Diagnosis not present

## 2015-12-29 DIAGNOSIS — I509 Heart failure, unspecified: Secondary | ICD-10-CM | POA: Insufficient documentation

## 2015-12-29 DIAGNOSIS — E785 Hyperlipidemia, unspecified: Secondary | ICD-10-CM | POA: Insufficient documentation

## 2015-12-29 DIAGNOSIS — R404 Transient alteration of awareness: Secondary | ICD-10-CM | POA: Diagnosis not present

## 2015-12-29 DIAGNOSIS — F419 Anxiety disorder, unspecified: Secondary | ICD-10-CM | POA: Insufficient documentation

## 2015-12-29 DIAGNOSIS — Z794 Long term (current) use of insulin: Secondary | ICD-10-CM | POA: Diagnosis not present

## 2015-12-29 DIAGNOSIS — M2578 Osteophyte, vertebrae: Secondary | ICD-10-CM | POA: Insufficient documentation

## 2015-12-29 DIAGNOSIS — R32 Unspecified urinary incontinence: Secondary | ICD-10-CM | POA: Diagnosis not present

## 2015-12-29 DIAGNOSIS — I10 Essential (primary) hypertension: Secondary | ICD-10-CM | POA: Insufficient documentation

## 2015-12-29 DIAGNOSIS — Z79899 Other long term (current) drug therapy: Secondary | ICD-10-CM | POA: Insufficient documentation

## 2015-12-29 DIAGNOSIS — T510X1A Toxic effect of ethanol, accidental (unintentional), initial encounter: Secondary | ICD-10-CM | POA: Diagnosis not present

## 2015-12-29 DIAGNOSIS — Z7982 Long term (current) use of aspirin: Secondary | ICD-10-CM | POA: Insufficient documentation

## 2015-12-29 DIAGNOSIS — I130 Hypertensive heart and chronic kidney disease with heart failure and stage 1 through stage 4 chronic kidney disease, or unspecified chronic kidney disease: Secondary | ICD-10-CM | POA: Diagnosis not present

## 2015-12-29 DIAGNOSIS — D509 Iron deficiency anemia, unspecified: Secondary | ICD-10-CM | POA: Diagnosis not present

## 2015-12-29 DIAGNOSIS — S0990XA Unspecified injury of head, initial encounter: Secondary | ICD-10-CM | POA: Diagnosis not present

## 2015-12-29 DIAGNOSIS — F10239 Alcohol dependence with withdrawal, unspecified: Secondary | ICD-10-CM | POA: Diagnosis not present

## 2015-12-29 DIAGNOSIS — E119 Type 2 diabetes mellitus without complications: Secondary | ICD-10-CM | POA: Insufficient documentation

## 2015-12-29 DIAGNOSIS — I739 Peripheral vascular disease, unspecified: Secondary | ICD-10-CM | POA: Diagnosis not present

## 2015-12-29 DIAGNOSIS — H9190 Unspecified hearing loss, unspecified ear: Secondary | ICD-10-CM | POA: Diagnosis not present

## 2015-12-29 DIAGNOSIS — E1122 Type 2 diabetes mellitus with diabetic chronic kidney disease: Secondary | ICD-10-CM | POA: Diagnosis not present

## 2015-12-29 DIAGNOSIS — K4020 Bilateral inguinal hernia, without obstruction or gangrene, not specified as recurrent: Secondary | ICD-10-CM | POA: Insufficient documentation

## 2015-12-29 DIAGNOSIS — J9811 Atelectasis: Secondary | ICD-10-CM | POA: Diagnosis not present

## 2015-12-29 HISTORY — DX: Other specified anxiety disorders: F41.8

## 2015-12-29 HISTORY — DX: Anemia, unspecified: D64.9

## 2015-12-29 LAB — BASIC METABOLIC PANEL
ANION GAP: 13 (ref 5–15)
BUN: 10 mg/dL (ref 6–20)
CHLORIDE: 102 mmol/L (ref 101–111)
CO2: 20 mmol/L — AB (ref 22–32)
CREATININE: 1.55 mg/dL — AB (ref 0.61–1.24)
Calcium: 8.6 mg/dL — ABNORMAL LOW (ref 8.9–10.3)
GFR calc non Af Amer: 43 mL/min — ABNORMAL LOW (ref >60)
GFR, EST AFRICAN AMERICAN: 50 mL/min — AB (ref >60)
Glucose, Bld: 209 mg/dL — ABNORMAL HIGH (ref 65–99)
POTASSIUM: 4.2 mmol/L (ref 3.5–5.1)
Sodium: 135 mmol/L (ref 135–145)

## 2015-12-29 LAB — CBC
HCT: 28.7 % — ABNORMAL LOW (ref 39.0–52.0)
HEMOGLOBIN: 9.1 g/dL — AB (ref 13.0–17.0)
MCH: 27 pg (ref 26.0–34.0)
MCHC: 31.7 g/dL (ref 30.0–36.0)
MCV: 85.2 fL (ref 78.0–100.0)
Platelets: 194 10*3/uL (ref 150–400)
RBC: 3.37 MIL/uL — AB (ref 4.22–5.81)
RDW: 14 % (ref 11.5–15.5)
WBC: 7 10*3/uL (ref 4.0–10.5)

## 2015-12-29 LAB — CBG MONITORING, ED: Glucose-Capillary: 192 mg/dL — ABNORMAL HIGH (ref 65–99)

## 2015-12-29 NOTE — ED Notes (Signed)
Per EMS, pt reported to have had 15min long grand mal seizure at home facility. Staff found pt on floor, unknown if fell. EMS reports hematoma to top of head. Received 5mg  versed IM from EMS. Has been postictal en route, being only partially responsive to commands. Responds to name upon arrival.

## 2015-12-29 NOTE — ED Provider Notes (Signed)
CSN: [id number]     Arrival date & time 12/29/15  2204 History  By signing my name below, I, Irene Pap, attest that this documentation has been prepared under the direction and in the presence of Alfonzo Beers, MD. Electronically Signed: Irene Pap, ED Scribe. 12/29/2015. 2:41 AM.  Chief Complaint  Patient presents with  . Seizures   Patient is a 70 y.o. male presenting with seizures. The history is provided by the EMS personnel. No language interpreter was used.  Seizures Seizure activity on arrival: no   Seizure type:  Grand mal Duration:  10 minutes Timing:  Once History of seizures: yes   HPI Comments (Level 5 Caveat due to condition of patient): William [name] is a 70 y.o. Male with a hx of Type II DM, HTN, alcohol abuse, alcohol withdrawal seizures, and chronic renal insufficiency brought in by EMS who presents to the Emergency Department complaining of grand mal seizure onset PTA. Per EMS, pt came from home facility following a 10 minute grand mal seizure. Pt was found on the floor by staff. They report associated hematoma to the top of his head. He was given 5 mg versed IM and has been postictal en route. Per nurse, pt has not responded to questions.  Past Medical History  Diagnosis Date  . Diabetes mellitus type II   . Hyperlipemia   . Anxiety   . Alcohol abuse   . HTN (hypertension)   . Cataract   . Alcohol withdrawal seizure ([place])   . Diabetes mellitus without complication ([place])   . Hypertension   . Hearing impairment   . Cataract   . HTN (hypertension), benign 02/26/2014  . Type II or unspecified type diabetes mellitus with neurological manifestations, not stated as uncontrolled 02/26/2014  . Chronic renal insufficiency, stage III (moderate) 02/26/2014  . Alcohol abuse 02/26/2014   Past Surgical History  Procedure Laterality Date  . No past surgeries     Family History  Problem Relation Age of Onset  . Family history unknown: Yes   Social History   Substance Use Topics  . Smoking status: Current Every Day Smoker    Types: Cigars  . Smokeless tobacco: Current User    Types: Chew  . Alcohol Use: Yes     Comment: 04/2015 I have not drank in over a month "    Review of Systems  Unable to perform ROS: Acuity of condition      Allergies  Review of patient's allergies indicates no known allergies.  Home Medications   Prior to Admission medications   Medication Sig Start Date End Date Taking? Authorizing Provider  aspirin (ASPIRIN EC) 81 MG EC tablet Take 81 mg by mouth daily. Swallow whole.   Yes Historical Provider, MD  folic acid (FOLVITE) 1 MG tablet Take 1 tablet (1 mg total) by mouth daily. 05/07/15  Yes Liberty Handy, MD  glipiZIDE (GLUCOTROL) 10 MG tablet Take 10 mg by mouth 2 (two) times daily before a meal.   Yes Historical Provider, MD  lisinopril (PRINIVIL,ZESTRIL) 5 MG tablet Take 5 mg by mouth daily.   Yes Historical Provider, MD  metFORMIN (GLUCOPHAGE) 1000 MG tablet Take 1,000 mg by mouth 2 (two) times daily with a meal.   Yes Historical Provider, MD  pravastatin (PRAVACHOL) 10 MG tablet Take 10 mg by mouth daily.   Yes Historical Provider, MD  Saccharomyces boulardii (FLORASTOR PO) Take 1 tablet by mouth 2 (two) times daily.   Yes Historical Provider, MD  thiamine 100 MG tablet Take 1 tablet (100 mg total) by mouth daily. 05/07/15  Yes Liberty Handy, MD   BP 144/75 mmHg  Pulse 61  Temp(Src) 97 F (36.1 C) (Axillary)  Resp 16  SpO2 98%  Vitals reviewed Physical Exam  Physical Examination: General appearance - somnolent, chronically ill appearing, and in no distress Mental status - alert, oriented to person, place, and time Eyes - pupils equal and reactive, no scleral icterus, no conjunctival injection Mouth - mucous membranes moist, pharynx normal without lesions Chest - clear to auscultation, no wheezes, rales or rhonchi, symmetric air entry Heart - normal rate, regular rhythm, normal S1, S2, no murmurs, rubs,  clicks or gallops Abdomen - soft, nontender, nondistended, no masses or organomegaly Neurological - sleeping but arousable, following some commands, not able to answer questions, protecting his airway but confused, moving all extremities, not able to cooperate with full neuro exam.  Extremities - peripheral pulses normal, no pedal edema, no clubbing or cyanosis Skin - normal coloration and turgor, no rashes  ED Course  Procedures (including critical care time) DIAGNOSTIC STUDIES: Oxygen Saturation is 97% on RA, normal by my interpretation.    COORDINATION OF CARE: 11:33 PM-labs  Labs Review Labs Reviewed  CBC - Abnormal; Notable for the following:    RBC 3.37 (*)    Hemoglobin 9.1 (*)    HCT 28.7 (*)    All other components within normal limits  BASIC METABOLIC PANEL - Abnormal; Notable for the following:    CO2 20 (*)    Glucose, Bld 209 (*)    Creatinine, Ser 1.55 (*)    Calcium 8.6 (*)    GFR calc non Af Amer 43 (*)    GFR calc Af Amer 50 (*)    All other components within normal limits  URINALYSIS, ROUTINE W REFLEX MICROSCOPIC (NOT AT [facility]) - Abnormal; Notable for the following:    APPearance CLOUDY (*)    pH 8.5 (*)    Glucose, UA 500 (*)    Protein, ur 30 (*)    All other components within normal limits  URINE RAPID DRUG SCREEN, HOSP PERFORMED - Abnormal; Notable for the following:    Benzodiazepines POSITIVE (*)    All other components within normal limits  URINE MICROSCOPIC-ADD ON - Abnormal; Notable for the following:    Squamous Epithelial / LPF 6-30 (*)    Bacteria, UA FEW (*)    All other components within normal limits  CBG MONITORING, ED - Abnormal; Notable for the following:    Glucose-Capillary 192 (*)    All other components within normal limits  URINE CULTURE  ETHANOL    Imaging Review Dg Chest 1 View  12/30/2015  CLINICAL DATA:  Status post grand mal seizure. Found on floor. Initial encounter. EXAM: CHEST 1 VIEW COMPARISON:  Chest radiograph  performed 11/19/2015 FINDINGS: The lungs are well-aerated. Mild bibasilar atelectasis is noted. Pulmonary vascularity is at the upper limits of normal. There is no evidence of pleural effusion or pneumothorax. The cardiomediastinal silhouette is mildly enlarged. No acute osseous abnormalities are seen. Degenerative change is noted at the left glenohumeral joint. Chronic left rib deformities are noted. IMPRESSION: Mild bibasilar atelectasis noted.  Mild cardiomegaly. Electronically Signed   By: Garald Balding M.D.   On: 12/30/2015 01:30   Ct Head Wo Contrast  12/30/2015  CLINICAL DATA:  Status post right mal seizure. Found on floor, with hematoma at the top of the head. Concern for cervical spine injury.  Initial encounter. EXAM: CT HEAD WITHOUT CONTRAST CT CERVICAL SPINE WITHOUT CONTRAST TECHNIQUE: Multidetector CT imaging of the head and cervical spine was performed following the standard protocol without intravenous contrast. Multiplanar CT image reconstructions of the cervical spine were also generated. COMPARISON:  CT of the head performed 11/19/2015, CT of the cervical spine performed 09/02/2012, and MRI of the brain performed 05/10/2013 FINDINGS: CT HEAD FINDINGS There is no evidence of acute infarction, mass lesion, or intra- or extra-axial hemorrhage on CT. Prominence of the ventricles and sulci reflects mild cortical volume loss. Mild cerebellar atrophy is noted. Scattered periventricular and subcortical white matter change likely reflects small vessel ischemic microangiopathy. The brainstem and fourth ventricle are within normal limits. The basal ganglia are unremarkable in appearance. The cerebral hemispheres demonstrate grossly normal gray-white differentiation. No mass effect or midline shift is seen. There is no evidence of fracture; visualized osseous structures are unremarkable in appearance. The visualized portions of the orbits are within normal limits. Mucosal thickening is noted at the right  maxillary sinus. The remaining paranasal sinuses and mastoid air cells are well-aerated. No significant soft tissue abnormalities are seen. CT CERVICAL SPINE FINDINGS There is no evidence of fracture or subluxation. Vertebral bodies demonstrate normal height and alignment. Intervertebral disc spaces are preserved. Prevertebral soft tissues are within normal limits. Scattered anterior and posterior disc osteophyte complexes noted along the cervical spine, with mild sclerotic change, and fusion at the posterior elements at C3-C4. The thyroid gland is unremarkable in appearance. The visualized lung apices are clear. No significant soft tissue abnormalities are seen. IMPRESSION: 1. No evidence of traumatic intracranial injury or fracture. 2. No evidence of fracture or subluxation along the cervical spine. 3. Mild cortical volume loss and scattered small vessel ischemic microangiopathy. 4. Mild degenerative change along the cervical spine. 5. Mucosal thickening at the right maxillary sinus. Electronically Signed   By: Garald Balding M.D.   On: 12/30/2015 01:12   Ct Cervical Spine Wo Contrast  12/30/2015  CLINICAL DATA:  Status post right mal seizure. Found on floor, with hematoma at the top of the head. Concern for cervical spine injury. Initial encounter. EXAM: CT HEAD WITHOUT CONTRAST CT CERVICAL SPINE WITHOUT CONTRAST TECHNIQUE: Multidetector CT imaging of the head and cervical spine was performed following the standard protocol without intravenous contrast. Multiplanar CT image reconstructions of the cervical spine were also generated. COMPARISON:  CT of the head performed 11/19/2015, CT of the cervical spine performed 09/02/2012, and MRI of the brain performed 05/10/2013 FINDINGS: CT HEAD FINDINGS There is no evidence of acute infarction, mass lesion, or intra- or extra-axial hemorrhage on CT. Prominence of the ventricles and sulci reflects mild cortical volume loss. Mild cerebellar atrophy is noted. Scattered  periventricular and subcortical white matter change likely reflects small vessel ischemic microangiopathy. The brainstem and fourth ventricle are within normal limits. The basal ganglia are unremarkable in appearance. The cerebral hemispheres demonstrate grossly normal gray-white differentiation. No mass effect or midline shift is seen. There is no evidence of fracture; visualized osseous structures are unremarkable in appearance. The visualized portions of the orbits are within normal limits. Mucosal thickening is noted at the right maxillary sinus. The remaining paranasal sinuses and mastoid air cells are well-aerated. No significant soft tissue abnormalities are seen. CT CERVICAL SPINE FINDINGS There is no evidence of fracture or subluxation. Vertebral bodies demonstrate normal height and alignment. Intervertebral disc spaces are preserved. Prevertebral soft tissues are within normal limits. Scattered anterior and posterior disc osteophyte complexes noted  along the cervical spine, with mild sclerotic change, and fusion at the posterior elements at C3-C4. The thyroid gland is unremarkable in appearance. The visualized lung apices are clear. No significant soft tissue abnormalities are seen. IMPRESSION: 1. No evidence of traumatic intracranial injury or fracture. 2. No evidence of fracture or subluxation along the cervical spine. 3. Mild cortical volume loss and scattered small vessel ischemic microangiopathy. 4. Mild degenerative change along the cervical spine. 5. Mucosal thickening at the right maxillary sinus. Electronically Signed   By: Garald Balding M.D.   On: 12/30/2015 01:12   I have personally reviewed and evaluated these images and lab results as part of my medical decision-making.   EKG Interpretation None      MDM   Final diagnoses:  Altered mental status  seizure  Pt presenting with altered mental status after witnessed seizure at his assited living facility.  Pt remains altered in the  ED.  No further seizure activity after versed via EMS.  CT scans and xray were reassuring.  No significant findings of infection on UA.  Pt to be admitted for further management.     3:51 AM d/w internal medicine resident for admission.  Attending is Dr. Beryle Beams.  Pt to go to telemetry bed.   I personally performed the services described in this documentation, which was scribed in my presence. The recorded information has been reviewed and is accurate.    Alfonzo Beers, MD 12/30/15 [phone]

## 2015-12-30 ENCOUNTER — Emergency Department (HOSPITAL_COMMUNITY): Payer: Medicare Other

## 2015-12-30 ENCOUNTER — Encounter (HOSPITAL_COMMUNITY): Payer: Self-pay | Admitting: Internal Medicine

## 2015-12-30 DIAGNOSIS — R569 Unspecified convulsions: Secondary | ICD-10-CM

## 2015-12-30 DIAGNOSIS — Z7401 Bed confinement status: Secondary | ICD-10-CM

## 2015-12-30 DIAGNOSIS — Z9119 Patient's noncompliance with other medical treatment and regimen: Secondary | ICD-10-CM

## 2015-12-30 DIAGNOSIS — D509 Iron deficiency anemia, unspecified: Secondary | ICD-10-CM | POA: Diagnosis not present

## 2015-12-30 DIAGNOSIS — G40509 Epileptic seizures related to external causes, not intractable, without status epilepticus: Secondary | ICD-10-CM

## 2015-12-30 DIAGNOSIS — G309 Alzheimer's disease, unspecified: Secondary | ICD-10-CM

## 2015-12-30 DIAGNOSIS — F10239 Alcohol dependence with withdrawal, unspecified: Secondary | ICD-10-CM | POA: Diagnosis not present

## 2015-12-30 DIAGNOSIS — F10939 Alcohol use, unspecified with withdrawal, unspecified: Secondary | ICD-10-CM | POA: Insufficient documentation

## 2015-12-30 DIAGNOSIS — I10 Essential (primary) hypertension: Secondary | ICD-10-CM

## 2015-12-30 DIAGNOSIS — Z7984 Long term (current) use of oral hypoglycemic drugs: Secondary | ICD-10-CM

## 2015-12-30 DIAGNOSIS — R32 Unspecified urinary incontinence: Secondary | ICD-10-CM

## 2015-12-30 DIAGNOSIS — E785 Hyperlipidemia, unspecified: Secondary | ICD-10-CM

## 2015-12-30 DIAGNOSIS — R159 Full incontinence of feces: Secondary | ICD-10-CM

## 2015-12-30 DIAGNOSIS — S0990XA Unspecified injury of head, initial encounter: Secondary | ICD-10-CM | POA: Diagnosis not present

## 2015-12-30 DIAGNOSIS — F1010 Alcohol abuse, uncomplicated: Secondary | ICD-10-CM | POA: Insufficient documentation

## 2015-12-30 DIAGNOSIS — Z79899 Other long term (current) drug therapy: Secondary | ICD-10-CM

## 2015-12-30 DIAGNOSIS — F0280 Dementia in other diseases classified elsewhere without behavioral disturbance: Secondary | ICD-10-CM

## 2015-12-30 DIAGNOSIS — J9811 Atelectasis: Secondary | ICD-10-CM | POA: Diagnosis not present

## 2015-12-30 DIAGNOSIS — S199XXA Unspecified injury of neck, initial encounter: Secondary | ICD-10-CM | POA: Diagnosis not present

## 2015-12-30 DIAGNOSIS — E119 Type 2 diabetes mellitus without complications: Secondary | ICD-10-CM

## 2015-12-30 LAB — HEPATIC FUNCTION PANEL
ALT: 11 U/L — ABNORMAL LOW (ref 17–63)
AST: 22 U/L (ref 15–41)
Albumin: 3.5 g/dL (ref 3.5–5.0)
Alkaline Phosphatase: 57 U/L (ref 38–126)
Bilirubin, Direct: 0.1 mg/dL (ref 0.1–0.5)
Indirect Bilirubin: 0.6 mg/dL (ref 0.3–0.9)
Total Bilirubin: 0.7 mg/dL (ref 0.3–1.2)
Total Protein: 6.4 g/dL — ABNORMAL LOW (ref 6.5–8.1)

## 2015-12-30 LAB — RAPID URINE DRUG SCREEN, HOSP PERFORMED
AMPHETAMINES: NOT DETECTED
BENZODIAZEPINES: POSITIVE — AB
Barbiturates: NOT DETECTED
COCAINE: NOT DETECTED
OPIATES: NOT DETECTED
TETRAHYDROCANNABINOL: NOT DETECTED

## 2015-12-30 LAB — CBC
HCT: 31.6 % — ABNORMAL LOW (ref 39.0–52.0)
Hemoglobin: 9.9 g/dL — ABNORMAL LOW (ref 13.0–17.0)
MCH: 27 pg (ref 26.0–34.0)
MCHC: 31.3 g/dL (ref 30.0–36.0)
MCV: 86.3 fL (ref 78.0–100.0)
Platelets: 215 K/uL (ref 150–400)
RBC: 3.66 MIL/uL — ABNORMAL LOW (ref 4.22–5.81)
RDW: 14.1 % (ref 11.5–15.5)
WBC: 7.4 K/uL (ref 4.0–10.5)

## 2015-12-30 LAB — URINALYSIS, ROUTINE W REFLEX MICROSCOPIC
Bilirubin Urine: NEGATIVE
Glucose, UA: 500 mg/dL — AB
Hgb urine dipstick: NEGATIVE
Ketones, ur: NEGATIVE mg/dL
LEUKOCYTES UA: NEGATIVE
NITRITE: NEGATIVE
PH: 8.5 — AB (ref 5.0–8.0)
Protein, ur: 30 mg/dL — AB
SPECIFIC GRAVITY, URINE: 1.016 (ref 1.005–1.030)

## 2015-12-30 LAB — ETHANOL: Alcohol, Ethyl (B): <5 mg/dL (ref <5)

## 2015-12-30 LAB — CREATININE, SERUM
Creatinine, Ser: 1.55 mg/dL — ABNORMAL HIGH (ref 0.61–1.24)
GFR, EST AFRICAN AMERICAN: 50 mL/min — AB (ref >60)
GFR, EST NON AFRICAN AMERICAN: 43 mL/min — AB (ref >60)

## 2015-12-30 LAB — CBG MONITORING, ED: GLUCOSE-CAPILLARY: 109 mg/dL — AB (ref 65–99)

## 2015-12-30 LAB — URINE MICROSCOPIC-ADD ON

## 2015-12-30 LAB — GLUCOSE, CAPILLARY: Glucose-Capillary: 162 mg/dL — ABNORMAL HIGH (ref 65–99)

## 2015-12-30 LAB — MAGNESIUM: Magnesium: 1.7 mg/dL (ref 1.7–2.4)

## 2015-12-30 MED ORDER — LORAZEPAM 1 MG PO TABS
1.0000 mg | ORAL_TABLET | Freq: Four times a day (QID) | ORAL | Status: AC | PRN
  Administered 2015-12-30: 1 mg via ORAL
  Filled 2015-12-30 (×2): qty 1

## 2015-12-30 MED ORDER — LORAZEPAM 2 MG/ML IJ SOLN
1.0000 mg | Freq: Four times a day (QID) | INTRAMUSCULAR | Status: AC | PRN
  Filled 2015-12-30: qty 1

## 2015-12-30 MED ORDER — PRAVASTATIN SODIUM 20 MG PO TABS
10.0000 mg | ORAL_TABLET | Freq: Every day | ORAL | Status: DC
  Administered 2015-12-31 – 2016-01-02 (×3): 10 mg via ORAL
  Filled 2015-12-30 (×4): qty 1

## 2015-12-30 MED ORDER — SODIUM CHLORIDE 0.9 % IV BOLUS (SEPSIS)
1000.0000 mL | Freq: Once | INTRAVENOUS | Status: AC
  Administered 2015-12-30: 1000 mL via INTRAVENOUS

## 2015-12-30 MED ORDER — INSULIN ASPART 100 UNIT/ML ~~LOC~~ SOLN
0.0000 [IU] | Freq: Every day | SUBCUTANEOUS | Status: DC
  Administered 2016-01-01: 2 [IU] via SUBCUTANEOUS

## 2015-12-30 MED ORDER — LORAZEPAM 2 MG/ML IJ SOLN: 2.0000 mg | Freq: Once | INTRAMUSCULAR | Status: DC

## 2015-12-30 MED ORDER — THIAMINE HCL 100 MG/ML IJ SOLN
100.0000 mg | Freq: Every day | INTRAMUSCULAR | Status: DC
  Filled 2015-12-30: qty 2

## 2015-12-30 MED ORDER — ACETAMINOPHEN 325 MG PO TABS: 650.0000 mg | ORAL_TABLET | Freq: Four times a day (QID) | ORAL | Status: DC | PRN

## 2015-12-30 MED ORDER — ASPIRIN EC 81 MG PO TBEC
81.0000 mg | DELAYED_RELEASE_TABLET | Freq: Every day | ORAL | Status: DC
  Administered 2015-12-30 – 2016-01-02 (×4): 81 mg via ORAL
  Filled 2015-12-30 (×4): qty 1

## 2015-12-30 MED ORDER — SODIUM CHLORIDE 0.9% FLUSH
3.0000 mL | Freq: Two times a day (BID) | INTRAVENOUS | Status: DC
  Administered 2015-12-30 – 2016-01-01 (×5): 3 mL via INTRAVENOUS

## 2015-12-30 MED ORDER — VITAMIN B-1 100 MG PO TABS
100.0000 mg | ORAL_TABLET | Freq: Every day | ORAL | Status: DC
  Administered 2015-12-30 – 2016-01-02 (×4): 100 mg via ORAL
  Filled 2015-12-30 (×4): qty 1

## 2015-12-30 MED ORDER — INSULIN ASPART 100 UNIT/ML ~~LOC~~ SOLN
0.0000 [IU] | Freq: Three times a day (TID) | SUBCUTANEOUS | Status: DC
  Administered 2015-12-31: 2 [IU] via SUBCUTANEOUS
  Administered 2015-12-31: 1 [IU] via SUBCUTANEOUS
  Administered 2015-12-31: 2 [IU] via SUBCUTANEOUS
  Administered 2016-01-01: 1 [IU] via SUBCUTANEOUS
  Administered 2016-01-01: 2 [IU] via SUBCUTANEOUS
  Administered 2016-01-01: 3 [IU] via SUBCUTANEOUS
  Administered 2016-01-02 (×2): 2 [IU] via SUBCUTANEOUS

## 2015-12-30 MED ORDER — ADULT MULTIVITAMIN W/MINERALS CH
1.0000 | ORAL_TABLET | Freq: Every day | ORAL | Status: DC
  Administered 2015-12-31 – 2016-01-02 (×3): 1 via ORAL
  Filled 2015-12-30 (×3): qty 1

## 2015-12-30 MED ORDER — VALPROATE SODIUM 500 MG/5ML IV SOLN
1.0000 g | Freq: Once | INTRAVENOUS | Status: AC
  Administered 2015-12-30: 1000 mg via INTRAVENOUS
  Filled 2015-12-30: qty 10

## 2015-12-30 MED ORDER — FERROUS SULFATE 325 (65 FE) MG PO TABS
325.0000 mg | ORAL_TABLET | Freq: Every day | ORAL | Status: DC
  Administered 2015-12-31 – 2016-01-02 (×3): 325 mg via ORAL
  Filled 2015-12-30 (×3): qty 1

## 2015-12-30 MED ORDER — ENOXAPARIN SODIUM 40 MG/0.4ML ~~LOC~~ SOLN
40.0000 mg | Freq: Every day | SUBCUTANEOUS | Status: DC
  Administered 2015-12-31 – 2016-01-02 (×3): 40 mg via SUBCUTANEOUS
  Filled 2015-12-30 (×4): qty 0.4

## 2015-12-30 MED ORDER — LISINOPRIL 5 MG PO TABS
5.0000 mg | ORAL_TABLET | Freq: Every day | ORAL | Status: DC
  Administered 2015-12-30 – 2016-01-02 (×4): 5 mg via ORAL
  Filled 2015-12-30 (×4): qty 1

## 2015-12-30 MED ORDER — FOLIC ACID 1 MG PO TABS
1.0000 mg | ORAL_TABLET | Freq: Every day | ORAL | Status: DC
  Administered 2015-12-31 – 2016-01-02 (×3): 1 mg via ORAL
  Filled 2015-12-30 (×3): qty 1

## 2015-12-30 MED ORDER — ACETAMINOPHEN 650 MG RE SUPP: 650.0000 mg | Freq: Four times a day (QID) | RECTAL | Status: DC | PRN

## 2015-12-30 MED ORDER — HALOPERIDOL LACTATE 5 MG/ML IJ SOLN
2.0000 mg | Freq: Once | INTRAMUSCULAR | Status: AC
  Administered 2015-12-30: 2 mg via INTRAMUSCULAR
  Filled 2015-12-30: qty 1

## 2015-12-30 MED ORDER — VALPROATE SODIUM 250 MG/5ML PO SYRP
500.0000 mg | ORAL_SOLUTION | Freq: Three times a day (TID) | ORAL | Status: DC
  Administered 2015-12-30 – 2016-01-02 (×8): 500 mg via ORAL
  Filled 2015-12-30 (×11): qty 10

## 2015-12-30 MED ORDER — SACCHAROMYCES BOULARDII 250 MG PO CAPS
250.0000 mg | ORAL_CAPSULE | Freq: Two times a day (BID) | ORAL | Status: DC
  Administered 2015-12-30 – 2016-01-02 (×6): 250 mg via ORAL
  Filled 2015-12-30 (×7): qty 1

## 2015-12-30 NOTE — ED Notes (Signed)
Pt taking meds without difficulty

## 2015-12-30 NOTE — Progress Notes (Signed)
Pt refuses to let staff remove his clothing to apply hospital gown

## 2015-12-30 NOTE — ED Notes (Signed)
Taken to CT.

## 2015-12-30 NOTE — ED Notes (Addendum)
Lab called -- pt needs to have labs redrawn for mg and hepatic function

## 2015-12-30 NOTE — ED Notes (Signed)
Report called to floor by Rolene Arbour, RN.  IV started and pt transported via wheelchair by Hassell Done, EMT to bed assignment.

## 2015-12-30 NOTE — Progress Notes (Addendum)
Subjective: This morning, he was resting comfortably. He answered questions though required [place] to articulate quite loudly in his ear.   Later in the morning, he became aggressive towards the nursing staff and required Haldol 2mg  IV.  Objective: Vital signs in last 24 hours: Filed Vitals:   12/30/15 0815 12/30/15 0845 12/30/15 0900 12/30/15 0915  BP: 142/77 141/78 146/76 153/75  Pulse: 62 56 60 63  Temp:      TempSrc:      Resp: 12 14 12 12   SpO2: 92% 96% 100% 83%   Weight change:  No intake or output data in the 24 hours ending 12/30/15 1259  General: elderly appearing Caucasian male, resting in bed, no acute distress HEENT: PERRL, EOMI, no scleral icterus, oropharynx could not be fully visualized Cardiac: regular rate, no rubs, murmurs or gallops Pulm: clear to auscultation bilaterally, no wheezes, rales, or rhonchi Abd: soft, nontender, nondistended, BS present Ext: warm and well perfused, no pedal edema Neuro: responds to questions appropriately though very hard of hearing; moving all extremities freely, 2+ patellar/brachial reflexes   Lab Results: Basic Metabolic Panel:  Recent Labs Lab 12/29/15 2240  NA 135  K 4.2  CL 102  CO2 20*  GLUCOSE 209*  BUN 10  CREATININE 1.55*  CALCIUM 8.6*   CBC:  Recent Labs Lab 12/29/15 2240  WBC 7.0  HGB 9.1*  HCT 28.7*  MCV 85.2  PLT 194   CBG:  Recent Labs Lab 12/29/15 2248 12/30/15 0825  GLUCAP 192* 109*   Urine Drug Screen: Drugs of Abuse     Component Value Date/Time   LABOPIA NONE DETECTED 12/30/2015 0133   COCAINSCRNUR NONE DETECTED 12/30/2015 0133   LABBENZ POSITIVE* 12/30/2015 0133   AMPHETMU NONE DETECTED 12/30/2015 0133   THCU NONE DETECTED 12/30/2015 0133   LABBARB NONE DETECTED 12/30/2015 0133    Alcohol Level:  Recent Labs Lab 12/29/15 2240  ETH <5   Urinalysis:  Recent Labs Lab 12/30/15 0133  COLORURINE YELLOW  LABSPEC 1.016  PHURINE 8.5*  GLUCOSEU 500*  HGBUR NEGATIVE    BILIRUBINUR NEGATIVE  KETONESUR NEGATIVE  PROTEINUR 30*  NITRITE NEGATIVE  LEUKOCYTESUR NEGATIVE   Studies/Results: Dg Chest 1 View  12/30/2015  CLINICAL DATA:  Status post grand mal seizure. Found on floor. Initial encounter. EXAM: CHEST 1 VIEW COMPARISON:  Chest radiograph performed 11/19/2015 FINDINGS: The lungs are well-aerated. Mild bibasilar atelectasis is noted. Pulmonary vascularity is at the upper limits of normal. There is no evidence of pleural effusion or pneumothorax. The cardiomediastinal silhouette is mildly enlarged. No acute osseous abnormalities are seen. Degenerative change is noted at the left glenohumeral joint. Chronic left rib deformities are noted. IMPRESSION: Mild bibasilar atelectasis noted.  Mild cardiomegaly. Electronically Signed   By: Garald Balding M.D.   On: 12/30/2015 01:30   Ct Head Wo Contrast  12/30/2015  CLINICAL DATA:  Status post right mal seizure. Found on floor, with hematoma at the top of the head. Concern for cervical spine injury. Initial encounter. EXAM: CT HEAD WITHOUT CONTRAST CT CERVICAL SPINE WITHOUT CONTRAST TECHNIQUE: Multidetector CT imaging of the head and cervical spine was performed following the standard protocol without intravenous contrast. Multiplanar CT image reconstructions of the cervical spine were also generated. COMPARISON:  CT of the head performed 11/19/2015, CT of the cervical spine performed 09/02/2012, and MRI of the brain performed 05/10/2013 FINDINGS: CT HEAD FINDINGS There is no evidence of acute infarction, mass lesion, or intra- or extra-axial hemorrhage on CT. Prominence  of the ventricles and sulci reflects mild cortical volume loss. Mild cerebellar atrophy is noted. Scattered periventricular and subcortical white matter change likely reflects small vessel ischemic microangiopathy. The brainstem and fourth ventricle are within normal limits. The basal ganglia are unremarkable in appearance. The cerebral hemispheres demonstrate  grossly normal gray-white differentiation. No mass effect or midline shift is seen. There is no evidence of fracture; visualized osseous structures are unremarkable in appearance. The visualized portions of the orbits are within normal limits. Mucosal thickening is noted at the right maxillary sinus. The remaining paranasal sinuses and mastoid air cells are well-aerated. No significant soft tissue abnormalities are seen. CT CERVICAL SPINE FINDINGS There is no evidence of fracture or subluxation. Vertebral bodies demonstrate normal height and alignment. Intervertebral disc spaces are preserved. Prevertebral soft tissues are within normal limits. Scattered anterior and posterior disc osteophyte complexes noted along the cervical spine, with mild sclerotic change, and fusion at the posterior elements at C3-C4. The thyroid gland is unremarkable in appearance. The visualized lung apices are clear. No significant soft tissue abnormalities are seen. IMPRESSION: 1. No evidence of traumatic intracranial injury or fracture. 2. No evidence of fracture or subluxation along the cervical spine. 3. Mild cortical volume loss and scattered small vessel ischemic microangiopathy. 4. Mild degenerative change along the cervical spine. 5. Mucosal thickening at the right maxillary sinus. Electronically Signed   By: Garald Balding M.D.   On: 12/30/2015 01:12   Ct Cervical Spine Wo Contrast  12/30/2015  CLINICAL DATA:  Status post right mal seizure. Found on floor, with hematoma at the top of the head. Concern for cervical spine injury. Initial encounter. EXAM: CT HEAD WITHOUT CONTRAST CT CERVICAL SPINE WITHOUT CONTRAST TECHNIQUE: Multidetector CT imaging of the head and cervical spine was performed following the standard protocol without intravenous contrast. Multiplanar CT image reconstructions of the cervical spine were also generated. COMPARISON:  CT of the head performed 11/19/2015, CT of the cervical spine performed 09/02/2012, and  MRI of the brain performed 05/10/2013 FINDINGS: CT HEAD FINDINGS There is no evidence of acute infarction, mass lesion, or intra- or extra-axial hemorrhage on CT. Prominence of the ventricles and sulci reflects mild cortical volume loss. Mild cerebellar atrophy is noted. Scattered periventricular and subcortical white matter change likely reflects small vessel ischemic microangiopathy. The brainstem and fourth ventricle are within normal limits. The basal ganglia are unremarkable in appearance. The cerebral hemispheres demonstrate grossly normal gray-white differentiation. No mass effect or midline shift is seen. There is no evidence of fracture; visualized osseous structures are unremarkable in appearance. The visualized portions of the orbits are within normal limits. Mucosal thickening is noted at the right maxillary sinus. The remaining paranasal sinuses and mastoid air cells are well-aerated. No significant soft tissue abnormalities are seen. CT CERVICAL SPINE FINDINGS There is no evidence of fracture or subluxation. Vertebral bodies demonstrate normal height and alignment. Intervertebral disc spaces are preserved. Prevertebral soft tissues are within normal limits. Scattered anterior and posterior disc osteophyte complexes noted along the cervical spine, with mild sclerotic change, and fusion at the posterior elements at C3-C4. The thyroid gland is unremarkable in appearance. The visualized lung apices are clear. No significant soft tissue abnormalities are seen. IMPRESSION: 1. No evidence of traumatic intracranial injury or fracture. 2. No evidence of fracture or subluxation along the cervical spine. 3. Mild cortical volume loss and scattered small vessel ischemic microangiopathy. 4. Mild degenerative change along the cervical spine. 5. Mucosal thickening at the right maxillary sinus. Electronically  Signed   By: Garald Balding M.D.   On: 12/30/2015 01:12   Medications: I have reviewed the patient's current  medications. Scheduled Meds: . aspirin EC  81 mg Oral Daily  . enoxaparin (LOVENOX) injection  40 mg Subcutaneous Daily  . ferrous sulfate  325 mg Oral Q breakfast  . folic acid  1 mg Oral Daily  . insulin aspart  0-5 Units Subcutaneous QHS  . insulin aspart  0-9 Units Subcutaneous TID WC  . lisinopril  5 mg Oral Daily  . multivitamin with minerals  1 tablet Oral Daily  . pravastatin  10 mg Oral Daily  . saccharomyces boulardii  250 mg Oral BID  . sodium chloride flush  3 mL Intravenous Q12H  . thiamine  100 mg Oral Daily   Or  . thiamine  100 mg Intravenous Daily  . valproic acid  500 mg Oral TID   Continuous Infusions:  PRN Meds:.acetaminophen **OR** acetaminophen, LORazepam **OR** LORazepam Assessment/Plan: Active Problems:   Altered mental status  Seizure: Facility reported 10 minute grand mal seizure without additional seizure-like activity in the ED. Etiology may be multifactorial. He does have a history of refusing medications per ALF. EEG 02/26/14 with comment on a potential foci in the left frontotemporal region. Prior diagnosis of Alzheimer's also suggests the presence of cerebrovascular disease. Other labwork has been reassuring. Low suspicion that he is alcohol withdrawal given that it seems unlikely he was consuming it given his deconditioning and residence at an ALF. -Continue depakote 500mg  PO TID and Ativan as needed for seizures  -Continue Haldol 2mg  IV as needed for sedation if aggressive -Order valproic acid level to be checked in 1 weeks at discharge (4/9)  History of alcohol abuse and withdrawal seizures: As noted above -CIWA monitoring with ativan PRN -Continue thiamine and folate supplementation  Iron deficiency anemia: Hgb 9.1 on admission down from previous baseline in 12s. Iron panel in 04/2015 and 10/2015 both consistent with iron deficiency given low ferritin 8, high normal TIBC 417. No signs of active bleeding. -Continue ferrous sulfate 375mg  daily -Repeat  CBC  Alzheimers Dementia: He is alert and oriented x3 at baseline. He has severe gait deficit and is bedbound. He is chronically incontinent of urine and stool wearing adult diapers. He requires assistance with ADLs bathing and dressing.   DM2: Last HgbA1c 6.4% from 12/2013. CBG 209 on presentation. He takes glipizide and metformin as outpatient medications.  -Continue SSI-S  HTN: Continue home Lisinopril 5 mg daily   HLD: Continue home Pravastatin 10 mg daily   Dispo: Disposition is deferred at this time, awaiting decisions regarding placement.  The patient does not have a current PCP (No primary care provider on file.) and does need an [facility] hospital follow-up appointment after discharge.  The patient does not have transportation limitations that hinder transportation to clinic appointments.  .Services Needed at time of discharge: Y = Yes, Blank = No PT:   OT:   RN:   Equipment:   Other:     LOS: 0 days   Riccardo Dubin, MD 12/30/2015, 12:59 PM

## 2015-12-30 NOTE — ED Notes (Signed)
Cousin -- Briscoe Deutscher [phone] -- [place]

## 2015-12-30 NOTE — H&P (Signed)
Date: 12/30/2015               Patient Name:  William [name] MRN: [id number]  DOB: July 09, 1946 Age / Sex: 70 y.o., male   PCP: No primary care provider on file.         Medical Service: Internal Medicine Teaching Service         Attending Physician: Dr. Annia Belt, MD    First Contact: Dr. Marijean Bravo Pager: [phone]  Second Contact: Dr. Posey Pronto Pager: [phone]       After Hours (After 5p/  First Contact Pager: [phone]  weekends / holidays): Second Contact Pager: [phone]   Chief Complaint: Seizure  History of Present Illness: [name] is a 70 yo male with PMHx of T2DM, CKD3, Seizures, alcohol abuse, HTN who presented today from his ALF, [facility], after being observed to have a seizure. Seizure was described by staff as a grand mal seizure lasting for 10 minutes. Patient was given Versed 5 mg IM. Since arrival, patient has been somnolent but arousable. He is awake and oriented x 1 (to self). He admits to head and neck pain and fatigue. Patient does not remember having a seizure nor does he remember having a history of seizures. He states he has not drank alcohol in a long time. He estimates 4-5 months. He did hit his head after falling and complains of mild pain. Patient did not have a recurrent seizure since arrival at the ED.  On chart review, patient has a history of alcohol withdrawal seizures. Patient has had at least 3 prior seizures documented. At least one of which was treated in the hospital in May 2015. Patient was discharged on Valproate 500 mg TID. This medication appears to have been discontinued at some point as patient was non-compliant and had not been taking the medicine. He was last seen here in February with acute encephalopathy due to UTI.  Meds: Current Facility-Administered Medications  Medication Dose Route Frequency Provider Last Rate Last Dose  . valproate (DEPACON) 1,000 mg in dextrose 5 % 50 mL IVPB  1 g Intravenous Once Alexa Angela Burke, MD 60 mL/hr at  12/30/15 0536 1,000 mg at 12/30/15 0536  . valproic acid (DEPAKENE) 250 MG/5ML syrup 500 mg  500 mg Oral TID Florinda Marker, MD       Current Outpatient Prescriptions  Medication Sig Dispense Refill  . aspirin (ASPIRIN EC) 81 MG EC tablet Take 81 mg by mouth daily. Swallow whole.    . folic acid (FOLVITE) 1 MG tablet Take 1 tablet (1 mg total) by mouth daily. 30 tablet 1  . glipiZIDE (GLUCOTROL) 10 MG tablet Take 10 mg by mouth 2 (two) times daily before a meal.    . lisinopril (PRINIVIL,ZESTRIL) 5 MG tablet Take 5 mg by mouth daily.    . metFORMIN (GLUCOPHAGE) 1000 MG tablet Take 1,000 mg by mouth 2 (two) times daily with a meal.    . pravastatin (PRAVACHOL) 10 MG tablet Take 10 mg by mouth daily.    . Saccharomyces boulardii (FLORASTOR PO) Take 1 tablet by mouth 2 (two) times daily.    Marland Kitchen thiamine 100 MG tablet Take 1 tablet (100 mg total) by mouth daily. 30 tablet 1    Allergies: Allergies as of 12/29/2015  . (No Known Allergies)   Past Medical History  Diagnosis Date  . Diabetes mellitus type II   . Hyperlipemia   . Anxiety   . Alcohol abuse   . HTN (  hypertension)   . Cataract   . Alcohol withdrawal seizure ([place])   . Diabetes mellitus without complication ([place])   . Hypertension   . Hearing impairment   . Cataract   . HTN (hypertension), benign 02/26/2014  . Type II or unspecified type diabetes mellitus with neurological manifestations, not stated as uncontrolled 02/26/2014  . Chronic renal insufficiency, stage III (moderate) 02/26/2014  . Alcohol abuse 02/26/2014   Past Surgical History  Procedure Laterality Date  . No past surgeries     Family History  Problem Relation Age of Onset  . Family history unknown: Yes   Social History   Social History  . Marital Status: Single    Spouse Name: N/A  . Number of Children: N/A  . Years of Education: N/A   Occupational History  . Not on file.   Social History Main Topics  . Smoking status: Current Every Day Smoker     Types: Cigars  . Smokeless tobacco: Current User    Types: Chew  . Alcohol Use: Yes     Comment: 04/2015 I have not drank in over a month "  . Drug Use: No  . Sexual Activity: Not on file   Other Topics Concern  . Not on file   Social History Narrative   ** Merged History Encounter **       Review of full family and social history limited by patient mental status at this time  Review of Systems: Review partially limited by patient mental status Review of Systems  Constitutional: Negative for fever.  HENT: Positive for hearing loss.   Eyes: Negative for blurred vision.  Respiratory: Negative for shortness of breath.   Cardiovascular: Negative for chest pain.  Gastrointestinal: Negative for diarrhea.  Genitourinary: Negative for dysuria.  Musculoskeletal: Positive for falls.  Skin: Negative for rash.  Neurological: Positive for seizures and loss of consciousness.  Psychiatric/Behavioral: The patient has insomnia.     Physical Exam: Filed Vitals:   12/30/15 0345 12/30/15 0400 12/30/15 0430 12/30/15 0515  BP: 123/68 128/60 123/68 144/75  Pulse: 61 58 67 61  Temp:      TempSrc:      Resp: 11 13 14 16   SpO2: 99% 98% 95% 98%   GENERAL- Arousable to voice, oriented to self, NAD HEENT- Small abrasion on crown of head, PERRL, edentulous, oral mucosa dry, hearing acuity greatly decreased bilaterally, no tenderness over cervical spine CARDIAC- RRR, no murmurs, rubs or gallops RESP- Poor effort, no wheezes or crackles ABDOMEN- Soft, nondistended, +guarding and mild tenderness diffusely, bowel sounds present throughout NEURO- Difficulty following directions, No obvious cranial nerve deficits, extremities 5/5 strength b/l, poor coordination and delay in mimicking hand shapes EXTREMITIES- 2+ pedal pulses b/l, symmetric, no pedal edema, shins tender to palpation SKIN- Warm, dry, No rash or lesion PSYCH- Cognitive slowing, memory deficits, appropriate speech   Lab results: Basic  Metabolic Panel:  Recent Labs  12/29/15 2240  NA 135  K 4.2  CL 102  CO2 20*  GLUCOSE 209*  BUN 10  CREATININE 1.55*  CALCIUM 8.6*   CBC:  Recent Labs  12/29/15 2240  WBC 7.0  HGB 9.1*  HCT 28.7*  MCV 85.2  PLT 194   CBG:  Recent Labs  12/29/15 2248  GLUCAP 192*   Urine Drug Screen: Drugs of Abuse     Component Value Date/Time   LABOPIA NONE DETECTED 12/30/2015 0133   COCAINSCRNUR NONE DETECTED 12/30/2015 0133   LABBENZ POSITIVE* 12/30/2015 0133  AMPHETMU NONE DETECTED 12/30/2015 0133   THCU NONE DETECTED 12/30/2015 0133   LABBARB NONE DETECTED 12/30/2015 0133    Alcohol Level:  Recent Labs  12/29/15 2240  ETH <5   Urinalysis:  Recent Labs  12/30/15 0133  COLORURINE YELLOW  LABSPEC 1.016  PHURINE 8.5*  GLUCOSEU 500*  HGBUR NEGATIVE  BILIRUBINUR NEGATIVE  KETONESUR NEGATIVE  PROTEINUR 30*  NITRITE NEGATIVE  LEUKOCYTESUR NEGATIVE    Imaging results:  Dg Chest 1 View  12/30/2015  CLINICAL DATA:  Status post grand mal seizure. Found on floor. Initial encounter. EXAM: CHEST 1 VIEW COMPARISON:  Chest radiograph performed 11/19/2015 FINDINGS: The lungs are well-aerated. Mild bibasilar atelectasis is noted. Pulmonary vascularity is at the upper limits of normal. There is no evidence of pleural effusion or pneumothorax. The cardiomediastinal silhouette is mildly enlarged. No acute osseous abnormalities are seen. Degenerative change is noted at the left glenohumeral joint. Chronic left rib deformities are noted. IMPRESSION: Mild bibasilar atelectasis noted.  Mild cardiomegaly. Electronically Signed   By: Garald Balding M.D.   On: 12/30/2015 01:30   Ct Head Wo Contrast  12/30/2015  CLINICAL DATA:  Status post right mal seizure. Found on floor, with hematoma at the top of the head. Concern for cervical spine injury. Initial encounter. EXAM: CT HEAD WITHOUT CONTRAST CT CERVICAL SPINE WITHOUT CONTRAST TECHNIQUE: Multidetector CT imaging of the head and  cervical spine was performed following the standard protocol without intravenous contrast. Multiplanar CT image reconstructions of the cervical spine were also generated. COMPARISON:  CT of the head performed 11/19/2015, CT of the cervical spine performed 09/02/2012, and MRI of the brain performed 05/10/2013 FINDINGS: CT HEAD FINDINGS There is no evidence of acute infarction, mass lesion, or intra- or extra-axial hemorrhage on CT. Prominence of the ventricles and sulci reflects mild cortical volume loss. Mild cerebellar atrophy is noted. Scattered periventricular and subcortical white matter change likely reflects small vessel ischemic microangiopathy. The brainstem and fourth ventricle are within normal limits. The basal ganglia are unremarkable in appearance. The cerebral hemispheres demonstrate grossly normal gray-white differentiation. No mass effect or midline shift is seen. There is no evidence of fracture; visualized osseous structures are unremarkable in appearance. The visualized portions of the orbits are within normal limits. Mucosal thickening is noted at the right maxillary sinus. The remaining paranasal sinuses and mastoid air cells are well-aerated. No significant soft tissue abnormalities are seen. CT CERVICAL SPINE FINDINGS There is no evidence of fracture or subluxation. Vertebral bodies demonstrate normal height and alignment. Intervertebral disc spaces are preserved. Prevertebral soft tissues are within normal limits. Scattered anterior and posterior disc osteophyte complexes noted along the cervical spine, with mild sclerotic change, and fusion at the posterior elements at C3-C4. The thyroid gland is unremarkable in appearance. The visualized lung apices are clear. No significant soft tissue abnormalities are seen. IMPRESSION: 1. No evidence of traumatic intracranial injury or fracture. 2. No evidence of fracture or subluxation along the cervical spine. 3. Mild cortical volume loss and scattered  small vessel ischemic microangiopathy. 4. Mild degenerative change along the cervical spine. 5. Mucosal thickening at the right maxillary sinus. Electronically Signed   By: Garald Balding M.D.   On: 12/30/2015 01:12   Ct Cervical Spine Wo Contrast  12/30/2015  CLINICAL DATA:  Status post right mal seizure. Found on floor, with hematoma at the top of the head. Concern for cervical spine injury. Initial encounter. EXAM: CT HEAD WITHOUT CONTRAST CT CERVICAL SPINE WITHOUT CONTRAST TECHNIQUE: Multidetector CT  imaging of the head and cervical spine was performed following the standard protocol without intravenous contrast. Multiplanar CT image reconstructions of the cervical spine were also generated. COMPARISON:  CT of the head performed 11/19/2015, CT of the cervical spine performed 09/02/2012, and MRI of the brain performed 05/10/2013 FINDINGS: CT HEAD FINDINGS There is no evidence of acute infarction, mass lesion, or intra- or extra-axial hemorrhage on CT. Prominence of the ventricles and sulci reflects mild cortical volume loss. Mild cerebellar atrophy is noted. Scattered periventricular and subcortical white matter change likely reflects small vessel ischemic microangiopathy. The brainstem and fourth ventricle are within normal limits. The basal ganglia are unremarkable in appearance. The cerebral hemispheres demonstrate grossly normal gray-white differentiation. No mass effect or midline shift is seen. There is no evidence of fracture; visualized osseous structures are unremarkable in appearance. The visualized portions of the orbits are within normal limits. Mucosal thickening is noted at the right maxillary sinus. The remaining paranasal sinuses and mastoid air cells are well-aerated. No significant soft tissue abnormalities are seen. CT CERVICAL SPINE FINDINGS There is no evidence of fracture or subluxation. Vertebral bodies demonstrate normal height and alignment. Intervertebral disc spaces are preserved.  Prevertebral soft tissues are within normal limits. Scattered anterior and posterior disc osteophyte complexes noted along the cervical spine, with mild sclerotic change, and fusion at the posterior elements at C3-C4. The thyroid gland is unremarkable in appearance. The visualized lung apices are clear. No significant soft tissue abnormalities are seen. IMPRESSION: 1. No evidence of traumatic intracranial injury or fracture. 2. No evidence of fracture or subluxation along the cervical spine. 3. Mild cortical volume loss and scattered small vessel ischemic microangiopathy. 4. Mild degenerative change along the cervical spine. 5. Mucosal thickening at the right maxillary sinus. Electronically Signed   By: Garald Balding M.D.   On: 12/30/2015 01:12   Assessment & Plan by Problem: Seizure: Facility reported 10 minute grand mal seizure. He received 5mg  versed in transit and was somewhat somnolent in the ED. By our exam he was readily arousable and participated although only oriented x1 and some cognitive slowing with tasks. Consistent with a post ictal state that is slowly resolving. He was discharged on valproate 500mg  TID as of 01/2015 after hospitalization for seizure, but this medication has been discontinued since then. Possibly because his seizures were attributed to alcohol which has discontinued, or because he never took this outpatient? Given his new seizure we will restart this medication. UDS negative except for the benzos given by EMS. UA not indicative of UTI. No obvious metabolic cause for encephalopathy. Unlikely he has been maintaining regular alcohol use prior to this. -Depacon 1g IV once then after 8 hours start 500mg  PO TID -Ativan PRN for repeat -Needs valproic acid level checked at 1 week  History of alcohol abuse and withdrawal seizures: Patient reports he has not had a drink in several months. He is nonambulatory and living at ALF so it seems unlikely he has had consistent access to ethanol  in the weeks leading up to this admission. EtOH <5. He does report not eating well at his facility. -CIWA monitoring with ativan PRN -Thiamine PO -Folvite  Iron deficiency anemia: Hgb 9.1 on admission down from previous baseline in 12s. Iron panel in 04/2015 and 10/2015 both consistent with iron deficiency. He does not appear to be actively bleeding by history or exam. Does not report noticing blood in his bowel movements or diarrhea. Has had a colonoscopy recommended since previous admission that  has not been followed up. -Start PO iron supplement -Repeat CBC -Patient still needs outpatient follow up with GI  Alzheimers Dementia: He is alert and oriented x3 at baseline. He has severe gait deficit and is bedbound. He is chronically incontinent of urine and stool wearing adult diapers. He requires assistance with ADLs bathing and dressing.  DM2: Last HgbA1c 6.4% from 12/2013. CBG 209 on presentation. He takes glipizide and metformin as outpatient medications. Unlikely related to his new seizure. -SSI-S -CBG q6hrs  HTN: BP 120/60s at present, stable problem -Continue home Lisinopril 5 mg daily   HLD -Continue home Pravastatin 10 mg daily   Diet: Soft VTE ppx: [place] enoxaparin FULL CODE  Dispo: Disposition is deferred at this time, awaiting improvement of current medical problems. Anticipated discharge in approximately 1-2 day(s).   The patient does have a current PCP (No primary care provider on file.) and does not need an [facility] hospital follow-up appointment after discharge.  The patient does not have transportation limitations that hinder transportation to clinic appointments.  Signed: Collier Salina, MD 12/30/2015, 5:52 AM

## 2015-12-30 NOTE — ED Notes (Signed)
Pt is yelling racist comments, stating that "I want to get the hell out of this hell hole! You get out of my way--" with fists clenched, and standing up in a threatening manner. Dr. Beryle Beams notified -- orders received.

## 2015-12-30 NOTE — ED Notes (Signed)
Spoke with nurse at [facility] in regards to this patient. Per the nurse the pt is at baseline A&Ox4.

## 2015-12-30 NOTE — Progress Notes (Signed)
Pt arrived from ER via wheelchair.  Patient EXTREMELY hard of hearing.  Calm upon initial arrival to the unit, but quickly became verbally abusive and combative with nursing staff.  Estill Bamberg, NT to sit with patient at present.  AC notified of need for sitter.  No sitter available at this time per [facility].

## 2015-12-30 NOTE — ED Notes (Signed)
Pt is yelling, " I want to get out of this mother fucking hole if you don't get me my chewing tobacco" --

## 2015-12-30 NOTE — ED Notes (Signed)
Pt responsive to voice and follows commands at this time.

## 2015-12-31 ENCOUNTER — Encounter (HOSPITAL_COMMUNITY): Payer: Self-pay | Admitting: Physician Assistant

## 2015-12-31 DIAGNOSIS — D509 Iron deficiency anemia, unspecified: Secondary | ICD-10-CM | POA: Diagnosis not present

## 2015-12-31 DIAGNOSIS — F1010 Alcohol abuse, uncomplicated: Secondary | ICD-10-CM

## 2015-12-31 DIAGNOSIS — Z9119 Patient's noncompliance with other medical treatment and regimen: Secondary | ICD-10-CM | POA: Diagnosis not present

## 2015-12-31 DIAGNOSIS — F10239 Alcohol dependence with withdrawal, unspecified: Secondary | ICD-10-CM | POA: Diagnosis not present

## 2015-12-31 DIAGNOSIS — N183 Chronic kidney disease, stage 3 (moderate): Secondary | ICD-10-CM | POA: Diagnosis not present

## 2015-12-31 DIAGNOSIS — G40509 Epileptic seizures related to external causes, not intractable, without status epilepticus: Secondary | ICD-10-CM | POA: Diagnosis not present

## 2015-12-31 LAB — MRSA PCR SCREENING: MRSA by PCR: NEGATIVE

## 2015-12-31 LAB — URINE CULTURE: CULTURE: NO GROWTH

## 2015-12-31 LAB — GLUCOSE, CAPILLARY
GLUCOSE-CAPILLARY: 152 mg/dL — AB (ref 65–99)
GLUCOSE-CAPILLARY: 175 mg/dL — AB (ref 65–99)
Glucose-Capillary: 152 mg/dL — ABNORMAL HIGH (ref 65–99)
Glucose-Capillary: 131 mg/dL — ABNORMAL HIGH (ref 65–99)
Glucose-Capillary: 170 mg/dL — ABNORMAL HIGH (ref 65–99)

## 2015-12-31 NOTE — Progress Notes (Signed)
Subjective: This morning, William [name] was resting comfortably in bed and was calm upon awakening. He did not have any complaints this AM.  Objective: Vital signs in last 24 hours: Filed Vitals:   12/30/15 1636 12/30/15 2308 12/31/15 0203 12/31/15 0606  BP: 147/89 138/68 165/96 147/86  Pulse: 66 62 62 60  Temp: 97.7 F (36.5 C) 97.5 F (36.4 C) 98.6 F (37 C) 98.6 F (37 C)  TempSrc:  Axillary Axillary Axillary  Resp: 14 16 14 14   SpO2: 98% 100% 99% 100%   Weight change:  No intake or output data in the 24 hours ending 12/31/15 0840  General: Lying in bed, NAD HEENT: PERRL, no scleral icterus. Moist mucous membranes. Cardiac: RRR, no m/r/g Pulm: clear to auscultation bilaterally, no wheezes, rales, or rhonchi Abd: soft, nontender, nondistended, BS present Ext: warm and well perfused, no pedal edema Neuro: Alert. Oriented to person, type of place (hospital), and year. When asked about the president, he said, "I don't mess with those people." Very hard of hearing. 5/5 strength in all extremities. No tremors.   Lab Results: Basic Metabolic Panel:  Recent Labs Lab 12/29/15 2240 12/30/15 1252 12/30/15 1655  NA 135  --   --   K 4.2  --   --   CL 102  --   --   CO2 20*  --   --   GLUCOSE 209*  --   --   BUN 10  --   --   CREATININE 1.55*  --  1.55*  CALCIUM 8.6*  --   --   MG  --  1.7  --    CBC:  Recent Labs Lab 12/29/15 2240 12/30/15 1655  WBC 7.0 7.4  HGB 9.1* 9.9*  HCT 28.7* 31.6*  MCV 85.2 86.3  PLT 194 215   CBG:  Recent Labs Lab 12/29/15 2248 12/30/15 0825 12/30/15 1633 12/30/15 2304 12/31/15 0644  GLUCAP 192* 109* 162* 152* 152*   Urine Drug Screen: Drugs of Abuse     Component Value Date/Time   LABOPIA NONE DETECTED 12/30/2015 0133   COCAINSCRNUR NONE DETECTED 12/30/2015 0133   LABBENZ POSITIVE* 12/30/2015 0133   AMPHETMU NONE DETECTED 12/30/2015 0133   THCU NONE DETECTED 12/30/2015 0133   LABBARB NONE DETECTED 12/30/2015 0133      Alcohol Level:  Recent Labs Lab 12/29/15 2240  ETH <5   Urinalysis:  Recent Labs Lab 12/30/15 0133  COLORURINE YELLOW  LABSPEC 1.016  PHURINE 8.5*  GLUCOSEU 500*  HGBUR NEGATIVE  BILIRUBINUR NEGATIVE  KETONESUR NEGATIVE  PROTEINUR 30*  NITRITE NEGATIVE  LEUKOCYTESUR NEGATIVE   Studies/Results: Dg Chest 1 View  12/30/2015  CLINICAL DATA:  Status post grand mal seizure. Found on floor. Initial encounter. EXAM: CHEST 1 VIEW COMPARISON:  Chest radiograph performed 11/19/2015 FINDINGS: The lungs are well-aerated. Mild bibasilar atelectasis is noted. Pulmonary vascularity is at the upper limits of normal. There is no evidence of pleural effusion or pneumothorax. The cardiomediastinal silhouette is mildly enlarged. No acute osseous abnormalities are seen. Degenerative change is noted at the left glenohumeral joint. Chronic left rib deformities are noted. IMPRESSION: Mild bibasilar atelectasis noted.  Mild cardiomegaly. Electronically Signed   By: Garald Balding M.D.   On: 12/30/2015 01:30   Ct Head Wo Contrast  12/30/2015  CLINICAL DATA:  Status post right mal seizure. Found on floor, with hematoma at the top of the head. Concern for cervical spine injury. Initial encounter. EXAM: CT HEAD WITHOUT CONTRAST CT CERVICAL  SPINE WITHOUT CONTRAST TECHNIQUE: Multidetector CT imaging of the head and cervical spine was performed following the standard protocol without intravenous contrast. Multiplanar CT image reconstructions of the cervical spine were also generated. COMPARISON:  CT of the head performed 11/19/2015, CT of the cervical spine performed 09/02/2012, and MRI of the brain performed 05/10/2013 FINDINGS: CT HEAD FINDINGS There is no evidence of acute infarction, mass lesion, or intra- or extra-axial hemorrhage on CT. Prominence of the ventricles and sulci reflects mild cortical volume loss. Mild cerebellar atrophy is noted. Scattered periventricular and subcortical white matter change likely  reflects small vessel ischemic microangiopathy. The brainstem and fourth ventricle are within normal limits. The basal ganglia are unremarkable in appearance. The cerebral hemispheres demonstrate grossly normal gray-white differentiation. No mass effect or midline shift is seen. There is no evidence of fracture; visualized osseous structures are unremarkable in appearance. The visualized portions of the orbits are within normal limits. Mucosal thickening is noted at the right maxillary sinus. The remaining paranasal sinuses and mastoid air cells are well-aerated. No significant soft tissue abnormalities are seen. CT CERVICAL SPINE FINDINGS There is no evidence of fracture or subluxation. Vertebral bodies demonstrate normal height and alignment. Intervertebral disc spaces are preserved. Prevertebral soft tissues are within normal limits. Scattered anterior and posterior disc osteophyte complexes noted along the cervical spine, with mild sclerotic change, and fusion at the posterior elements at C3-C4. The thyroid gland is unremarkable in appearance. The visualized lung apices are clear. No significant soft tissue abnormalities are seen. IMPRESSION: 1. No evidence of traumatic intracranial injury or fracture. 2. No evidence of fracture or subluxation along the cervical spine. 3. Mild cortical volume loss and scattered small vessel ischemic microangiopathy. 4. Mild degenerative change along the cervical spine. 5. Mucosal thickening at the right maxillary sinus. Electronically Signed   By: Garald Balding M.D.   On: 12/30/2015 01:12   Ct Cervical Spine Wo Contrast  12/30/2015  CLINICAL DATA:  Status post right mal seizure. Found on floor, with hematoma at the top of the head. Concern for cervical spine injury. Initial encounter. EXAM: CT HEAD WITHOUT CONTRAST CT CERVICAL SPINE WITHOUT CONTRAST TECHNIQUE: Multidetector CT imaging of the head and cervical spine was performed following the standard protocol without  intravenous contrast. Multiplanar CT image reconstructions of the cervical spine were also generated. COMPARISON:  CT of the head performed 11/19/2015, CT of the cervical spine performed 09/02/2012, and MRI of the brain performed 05/10/2013 FINDINGS: CT HEAD FINDINGS There is no evidence of acute infarction, mass lesion, or intra- or extra-axial hemorrhage on CT. Prominence of the ventricles and sulci reflects mild cortical volume loss. Mild cerebellar atrophy is noted. Scattered periventricular and subcortical white matter change likely reflects small vessel ischemic microangiopathy. The brainstem and fourth ventricle are within normal limits. The basal ganglia are unremarkable in appearance. The cerebral hemispheres demonstrate grossly normal gray-white differentiation. No mass effect or midline shift is seen. There is no evidence of fracture; visualized osseous structures are unremarkable in appearance. The visualized portions of the orbits are within normal limits. Mucosal thickening is noted at the right maxillary sinus. The remaining paranasal sinuses and mastoid air cells are well-aerated. No significant soft tissue abnormalities are seen. CT CERVICAL SPINE FINDINGS There is no evidence of fracture or subluxation. Vertebral bodies demonstrate normal height and alignment. Intervertebral disc spaces are preserved. Prevertebral soft tissues are within normal limits. Scattered anterior and posterior disc osteophyte complexes noted along the cervical spine, with mild sclerotic change, and  fusion at the posterior elements at C3-C4. The thyroid gland is unremarkable in appearance. The visualized lung apices are clear. No significant soft tissue abnormalities are seen. IMPRESSION: 1. No evidence of traumatic intracranial injury or fracture. 2. No evidence of fracture or subluxation along the cervical spine. 3. Mild cortical volume loss and scattered small vessel ischemic microangiopathy. 4. Mild degenerative change  along the cervical spine. 5. Mucosal thickening at the right maxillary sinus. Electronically Signed   By: Garald Balding M.D.   On: 12/30/2015 01:12   Medications: I have reviewed the patient's current medications. Scheduled Meds: . aspirin EC  81 mg Oral Daily  . enoxaparin (LOVENOX) injection  40 mg Subcutaneous Daily  . ferrous sulfate  325 mg Oral Q breakfast  . folic acid  1 mg Oral Daily  . insulin aspart  0-5 Units Subcutaneous QHS  . insulin aspart  0-9 Units Subcutaneous TID WC  . lisinopril  5 mg Oral Daily  . multivitamin with minerals  1 tablet Oral Daily  . pravastatin  10 mg Oral Daily  . saccharomyces boulardii  250 mg Oral BID  . sodium chloride flush  3 mL Intravenous Q12H  . thiamine  100 mg Oral Daily   Or  . thiamine  100 mg Intravenous Daily  . valproic acid  500 mg Oral TID   Continuous Infusions:  PRN Meds:.acetaminophen **OR** acetaminophen, LORazepam **OR** LORazepam Assessment/Plan:  Seizure presumed secondary to alcohol withdrawal: Frontotemporal seizure focus seen on EEG in 2015, but patient reportedly had empty bottles of alcohol in his room. Collateral information from common law wife Briscoe Deutscher indicated that his previous behavioral outbursts are consistent with times he's abstained from alcohol. He needed a 1 mg dose of lorazepam at 4 pm yesterday. His last two recorded CIWAs have been 0. -Continue depakote 500mg  PO TID and Ativan as needed for seizures; will reevaluate AED regimen -Continue Haldol 2mg  IV as needed for sedation if aggressive -Order valproic acid level to be checked in 1 weeks at discharge (4/9) -CIWA monitoring with ativan PRN as above -Continue thiamine and folate supplementation  Iron deficiency anemia: Hgb 9.1 on admission down from previous baseline in 12s. Iron panel in 04/2015 and 10/2015 both consistent with iron deficiency given low ferritin 8, high normal TIBC 417. No signs of active bleeding. Patient was previously supposed to  have an colonoscopy after his previous admission and he did not follow up. - Consult GI for possible inpatient colonoscopy while awaiting SNF placement -Continue ferrous sulfate 375mg  daily  Alzheimers Dementia: He is alert and oriented x3 at baseline. He has severe gait deficit and is bedbound. He is chronically incontinent of urine and stool wearing adult diapers. He requires assistance with ADLs bathing and dressing.   DM2: Last HgbA1c 6.4% from 12/2013. CBG 209 on presentation. He takes glipizide and metformin as outpatient medications.  -Continue SSI-S  HTN: Continue home Lisinopril 5 mg daily   HLD: Continue home Pravastatin 10 mg daily   Dispo: Previous ALF [place] not amenable to receiving patient again. It is likely he may need SNF placement for enhanced medication monitoring.   The patient does not have a current PCP (No primary care provider on file.) and does need an [facility] hospital follow-up appointment after discharge.  The patient does not have transportation limitations that hinder transportation to clinic appointments.  .Services Needed at time of discharge: Y = Yes, Blank = No PT:   OT:   RN:  Equipment:   Other:     LOS: 1 day   Liberty Handy, MD 12/31/2015, 8:40 AM

## 2015-12-31 NOTE — Consult Note (Signed)
[facility] Consult: 12:47 PM 12/31/2015  LOS: 1 day    Referring Provider: Dr Ouida Sills  Primary Care Physician:  No primary care provider on file. Primary Gastroenterologist:  unassigned     Reason for Consultation:  Anemia, FOBT negative.    HPI: William [name] is a 70 y.o. male. Assisted living resident.  Longstanding alcoholism. Type 2 DM.  Stage 3 CKD (GFR 50).  Dementia, on Aricept.  Hearing impaired. Hx etoh withdrawal seizures.  CT 04/2015 showed gallstones, right and left inguinal hernias, liver and pancreas normal.    Pt diagnosed with iron def anemia ( Hgb 11.7, ferritin 8) during 2/20- 11/21/15 admission with enterococcal UTI.  FOB testing not performed. Was to have referral to a GI MD but this never happened after his discharge back to assisted living.  Med list included Folic acid, Thiamine, 81 ASA but no po Iron or PPI.   Never made it back to 3/8 appt with outpt resident clinic.     Admitted 4/1 with witnessed grand mal seizure.  Brain imaging work up negative and empty bottles of booze found in pt's room, so presumed to have had ETOH withdrawal seizure.   Has displayed agitation/aggressive behavior requiring Haldol during admission.  Hgb down to 9.1. MCV 85.  Has not been FOB tested.  Past Medical History  Diagnosis Date  . Diabetes mellitus type II   . Hyperlipemia   . Depression with anxiety   . HTN (hypertension)   . Cataract   . Alcohol withdrawal seizure ([place]) 2013  . Diabetes mellitus without complication ([place])   . Hypertension   . Hearing impairment   . Cataract   . HTN (hypertension), benign 02/26/2014  . Type II or unspecified type diabetes mellitus with neurological manifestations, not stated as uncontrolled 02/26/2014  . Chronic renal insufficiency, stage III (moderate)  02/26/2014  . Alcohol abuse 2009    dependency as exhibited by ETOH withdrawal seizure.   . Anemia 2011    Normocytic.     Past Surgical History  Procedure Laterality Date  . No past surgeries      Prior to Admission medications   Medication Sig Start Date End Date Taking? Authorizing Provider  aspirin (ASPIRIN EC) 81 MG EC tablet Take 81 mg by mouth daily. Swallow whole.   Yes Historical Provider, MD  folic acid (FOLVITE) 1 MG tablet Take 1 tablet (1 mg total) by mouth daily. 05/07/15  Yes Liberty Handy, MD  glipiZIDE (GLUCOTROL) 10 MG tablet Take 10 mg by mouth 2 (two) times daily before a meal.   Yes Historical Provider, MD  lisinopril (PRINIVIL,ZESTRIL) 5 MG tablet Take 5 mg by mouth daily.   Yes Historical Provider, MD  metFORMIN (GLUCOPHAGE) 1000 MG tablet Take 1,000 mg by mouth 2 (two) times daily with a meal.   Yes Historical Provider, MD  pravastatin (PRAVACHOL) 10 MG tablet Take 10 mg by mouth daily.   Yes Historical Provider, MD  Saccharomyces boulardii (FLORASTOR PO) Take 1 tablet by mouth 2 (two) times daily.   Yes Historical  Provider, MD  thiamine 100 MG tablet Take 1 tablet (100 mg total) by mouth daily. 05/07/15  Yes Liberty Handy, MD    Scheduled Meds: . aspirin EC  81 mg Oral Daily  . enoxaparin (LOVENOX) injection  40 mg Subcutaneous Daily  . ferrous sulfate  325 mg Oral Q breakfast  . folic acid  1 mg Oral Daily  . insulin aspart  0-5 Units Subcutaneous QHS  . insulin aspart  0-9 Units Subcutaneous TID WC  . lisinopril  5 mg Oral Daily  . multivitamin with minerals  1 tablet Oral Daily  . pravastatin  10 mg Oral Daily  . saccharomyces boulardii  250 mg Oral BID  . sodium chloride flush  3 mL Intravenous Q12H  . thiamine  100 mg Oral Daily   Or  . thiamine  100 mg Intravenous Daily  . valproic acid  500 mg Oral TID   Infusions:   PRN Meds: acetaminophen **OR** acetaminophen, LORazepam **OR** LORazepam   Allergies as of 12/29/2015  . (No Known Allergies)     Family History  Problem Relation Age of Onset  . Family history unknown: Yes    Social History   Social History  . Marital Status: Single    Spouse Name: N/A  . Number of Children: N/A  . Years of Education: N/A   Occupational History  . Not on file.   Social History Main Topics  . Smoking status: Current Every Day Smoker    Types: Cigars  . Smokeless tobacco: Current User    Types: Chew  . Alcohol Use: Yes     Comment: 04/2015 I have not drank in over a month "  . Drug Use: No  . Sexual Activity: Not on file   Other Topics Concern  . Not on file   Social History Narrative   ** Merged History Encounter **        REVIEW OF SYSTEMS: Constitutional:  Stable weight.  ENT:  No nose bleeds Pulm:  No cough or dyspnea CV:  No palpitations, no LE edema.  GU:  Incontinent.  GI:  Denies constipation and diarrhea Heme:  No unusual bleeding or bruising   Transfusions:  None in Epic records Neuro:  + hearing loss Psych: hx belligerent behavior on previous admissions. Derm:  No itching, no rash or sores.  Endocrine:  No sweats or chills.  No polyuria or dysuria Immunization:  Reviewed.  Travel:  None beyond local counties in last few months.    PHYSICAL EXAM: Vital signs in last 24 hours: Filed Vitals:   12/31/15 0203 12/31/15 0606  BP: 165/96 147/86  Pulse: 62 60  Temp: 98.6 F (37 C) 98.6 F (37 C)  Resp: 14 14   Wt Readings from Last 3 Encounters:  11/20/15 86.183 kg (190 lb)  05/04/15 76.068 kg (167 lb 11.2 oz)  07/01/14 83.915 kg (185 lb)    General: somnolent, unwell appearing WM.  Hard to wake up (due to [facility] and/or ental status?).  Speech slurred and near impossible to understand.  Head:  No trauma or asymmetry  Eyes:  No scleral icterus or conj pallor.  Ears:  Extremely HOH  Nose:  No discharge Mouth:  Poor dentition, moist and pink oral mm.   Neck:  No mass, tmg or jvd Lungs:  Clear bil but overall reduced lung sounds.  No cough Heart: RRR.   No MRG.  S1/s2 audible Abdomen:  Soft, active BS.  No HSM, bruits, masses, hernias.  Slight tenderness in RLQ. GU: condom cath had fallen off and pt was urinating freely in the bed.  Circumcised.    Rectal: stool is brown and FOBT negative.  Large, firm, smooth, non-tender prostate.     Musc/Skeltl: no joint redness or swelling Extremities:  No CCE  Neurologic:  Hearing loss makes assessment inadequate but does follow commands.  No tremor or limb weakness Skin:  No telangectasia.  + purpura on lower arms Psych:  Cooperative.    Intake/Output from previous day:   Intake/Output this shift:    LAB RESULTS:  Recent Labs  12/29/15 2240 12/30/15 1655  WBC 7.0 7.4  HGB 9.1* 9.9*  HCT 28.7* 31.6*  PLT 194 215   BMET Lab Results  Component Value Date   NA 135 12/29/2015   NA 139 11/21/2015   NA 139 11/20/2015   K 4.2 12/29/2015   K 4.3 11/21/2015   K 4.5 11/20/2015   CL 102 12/29/2015   CL 101 11/21/2015   CL 100* 11/20/2015   CO2 20* 12/29/2015   CO2 26 11/21/2015   CO2 28 11/20/2015   GLUCOSE 209* 12/29/2015   GLUCOSE 150* 11/21/2015   GLUCOSE 143* 11/20/2015   BUN 10 12/29/2015   BUN 17 11/21/2015   BUN 15 11/20/2015   CREATININE 1.55* 12/30/2015   CREATININE 1.55* 12/29/2015   CREATININE 1.53* 11/21/2015   CALCIUM 8.6* 12/29/2015   CALCIUM 9.4 11/21/2015   CALCIUM 10.0 11/20/2015   LFT  Recent Labs  12/30/15 1252  PROT 6.4*  ALBUMIN 3.5  AST 22  ALT 11*  ALKPHOS 57  BILITOT 0.7  BILIDIR 0.1  IBILI 0.6   PT/INR Lab Results  Component Value Date   INR 1.09 08/21/2012   INR 1.14 05/28/2012   INR 1.09 10/21/2009   Hepatitis Panel No results for input(s): HEPBSAG, HCVAB, HEPAIGM, HEPBIGM in the last 72 hours. C-Diff No components found for: CDIFF Lipase     Component Value Date/Time   LIPASE 18* 05/04/2015 0245    Drugs of Abuse     Component Value Date/Time   LABOPIA NONE DETECTED 12/30/2015 0133   COCAINSCRNUR NONE DETECTED 12/30/2015  0133   LABBENZ POSITIVE* 12/30/2015 0133   AMPHETMU NONE DETECTED 12/30/2015 0133   THCU NONE DETECTED 12/30/2015 0133   LABBARB NONE DETECTED 12/30/2015 0133     RADIOLOGY STUDIES: Dg Chest 1 View  12/30/2015  CLINICAL DATA:  Status post grand mal seizure. Found on floor. Initial encounter. EXAM: CHEST 1 VIEW COMPARISON:  Chest radiograph performed 11/19/2015 FINDINGS: The lungs are well-aerated. Mild bibasilar atelectasis is noted. Pulmonary vascularity is at the upper limits of normal. There is no evidence of pleural effusion or pneumothorax. The cardiomediastinal silhouette is mildly enlarged. No acute osseous abnormalities are seen. Degenerative change is noted at the left glenohumeral joint. Chronic left rib deformities are noted. IMPRESSION: Mild bibasilar atelectasis noted.  Mild cardiomegaly. Electronically Signed   By: Garald Balding M.D.   On: 12/30/2015 01:30   Ct Head Wo Contrast  12/30/2015  CLINICAL DATA:  Status post right mal seizure. Found on floor, with hematoma at the top of the head. Concern for cervical spine injury. Initial encounter. EXAM: CT HEAD WITHOUT CONTRAST CT CERVICAL SPINE WITHOUT CONTRAST TECHNIQUE: Multidetector CT imaging of the head and cervical spine was performed following the standard protocol without intravenous contrast. Multiplanar CT image reconstructions of the cervical spine were also generated. COMPARISON:  CT of the head performed 11/19/2015, CT of the cervical  spine performed 09/02/2012, and MRI of the brain performed 05/10/2013 FINDINGS: CT HEAD FINDINGS There is no evidence of acute infarction, mass lesion, or intra- or extra-axial hemorrhage on CT. Prominence of the ventricles and sulci reflects mild cortical volume loss. Mild cerebellar atrophy is noted. Scattered periventricular and subcortical white matter change likely reflects small vessel ischemic microangiopathy. The brainstem and fourth ventricle are within normal limits. The basal ganglia are  unremarkable in appearance. The cerebral hemispheres demonstrate grossly normal gray-white differentiation. No mass effect or midline shift is seen. There is no evidence of fracture; visualized osseous structures are unremarkable in appearance. The visualized portions of the orbits are within normal limits. Mucosal thickening is noted at the right maxillary sinus. The remaining paranasal sinuses and mastoid air cells are well-aerated. No significant soft tissue abnormalities are seen. CT CERVICAL SPINE FINDINGS There is no evidence of fracture or subluxation. Vertebral bodies demonstrate normal height and alignment. Intervertebral disc spaces are preserved. Prevertebral soft tissues are within normal limits. Scattered anterior and posterior disc osteophyte complexes noted along the cervical spine, with mild sclerotic change, and fusion at the posterior elements at C3-C4. The thyroid gland is unremarkable in appearance. The visualized lung apices are clear. No significant soft tissue abnormalities are seen. IMPRESSION: 1. No evidence of traumatic intracranial injury or fracture. 2. No evidence of fracture or subluxation along the cervical spine. 3. Mild cortical volume loss and scattered small vessel ischemic microangiopathy. 4. Mild degenerative change along the cervical spine. 5. Mucosal thickening at the right maxillary sinus. Electronically Signed   By: Garald Balding M.D.   On: 12/30/2015 01:12   Ct Cervical Spine Wo Contrast  12/30/2015  CLINICAL DATA:  Status post right mal seizure. Found on floor, with hematoma at the top of the head. Concern for cervical spine injury. Initial encounter. EXAM: CT HEAD WITHOUT CONTRAST CT CERVICAL SPINE WITHOUT CONTRAST TECHNIQUE: Multidetector CT imaging of the head and cervical spine was performed following the standard protocol without intravenous contrast. Multiplanar CT image reconstructions of the cervical spine were also generated. COMPARISON:  CT of the head  performed 11/19/2015, CT of the cervical spine performed 09/02/2012, and MRI of the brain performed 05/10/2013 FINDINGS: CT HEAD FINDINGS There is no evidence of acute infarction, mass lesion, or intra- or extra-axial hemorrhage on CT. Prominence of the ventricles and sulci reflects mild cortical volume loss. Mild cerebellar atrophy is noted. Scattered periventricular and subcortical white matter change likely reflects small vessel ischemic microangiopathy. The brainstem and fourth ventricle are within normal limits. The basal ganglia are unremarkable in appearance. The cerebral hemispheres demonstrate grossly normal gray-white differentiation. No mass effect or midline shift is seen. There is no evidence of fracture; visualized osseous structures are unremarkable in appearance. The visualized portions of the orbits are within normal limits. Mucosal thickening is noted at the right maxillary sinus. The remaining paranasal sinuses and mastoid air cells are well-aerated. No significant soft tissue abnormalities are seen. CT CERVICAL SPINE FINDINGS There is no evidence of fracture or subluxation. Vertebral bodies demonstrate normal height and alignment. Intervertebral disc spaces are preserved. Prevertebral soft tissues are within normal limits. Scattered anterior and posterior disc osteophyte complexes noted along the cervical spine, with mild sclerotic change, and fusion at the posterior elements at C3-C4. The thyroid gland is unremarkable in appearance. The visualized lung apices are clear. No significant soft tissue abnormalities are seen. IMPRESSION: 1. No evidence of traumatic intracranial injury or fracture. 2. No evidence of fracture or subluxation along  the cervical spine. 3. Mild cortical volume loss and scattered small vessel ischemic microangiopathy. 4. Mild degenerative change along the cervical spine. 5. Mucosal thickening at the right maxillary sinus. Electronically Signed   By: Garald Balding M.D.   On:  12/30/2015 01:12    ENDOSCOPIC STUDIES: None  IMPRESSION:   *  Iron def anemia.   FOBT negative on 1/1 tests (my rectal exam).   *  Acute ETOH withdrawal seizure, hx of these dates back a few years  *  Alcohol dependence.   *  Dementia.  *  Type 2 DM  *  CKD, stage 3.     PLAN:     *  Set up tentavelyy for a colon/EGD with Propofol for 4/5.  I suspect prep will be challenging.     Azucena Freed  12/31/2015, 12:47 PM Pager: [phone]

## 2015-12-31 NOTE — Progress Notes (Signed)
Patient ID: William [name], male   DOB: Apr 07, 1946, 70 y.o.   MRN: [id number] Medicine attending: I examined this patient this morning and I concur with the evaluation and management plan as recorded by resident physician Dr. Liberty Handy. The patient has calmed down considerably since yesterday. He is on the alcohol withdrawal program and did not require any additional sedation during the night. No focal neurologic deficits except for severe bilateral sensory neural hearing deficit. Blood sugars in good range. We will involve our care managers in locating suitable placement but it is clear that this man needs to be and a 24-hour supervised situation. Hemoglobin improved from admission value but remains low. Iron studies done during February admission confirm that this is primarily iron deficiency anemia with a ferritin of 8. Likely long-standing with previous ferritin of 16 recorded in August 2016. Given compliance issues, we will ask gastroenterology to see him while he is in the hospital for an evaluation. I'm not sure of her occupational therapists are involved with hearing tests or getting him a hearing aid but he would certainly benefit from this.

## 2015-12-31 NOTE — Care Management Note (Signed)
Case Management Note  Patient Details  Name: William [name] MRN: [id number] Date of Birth: 01-24-1946  Subjective/Objective:                    Action/Plan: Patient was admitted with altered mental status.  Resides at [facility].  Per physician documentation, "Previous ALF [facility] not amenable to receiving patient again".  Will continue to follow for discharge planning pending final disposition.  Expected Discharge Date:                  Expected Discharge Plan:  Assisted Living / Rest Home  In-House Referral:  Clinical Social Work  Discharge planning Services     Post Acute Care Choice:    Choice offered to:     DME Arranged:    DME Agency:     HH Arranged:    [place] Agency:     Status of Service:  In process, will continue to follow  Medicare Important Message Given:    Date Medicare IM Given:    Medicare IM give by:    Date Additional Medicare IM Given:    Additional Medicare Important Message give by:     If discussed at [place] of Stay Meetings, dates discussed:    Additional Comments:  Rolm Baptise, RN 12/31/2015, 11:43 AM [phone]

## 2016-01-01 DIAGNOSIS — G40509 Epileptic seizures related to external causes, not intractable, without status epilepticus: Secondary | ICD-10-CM | POA: Diagnosis not present

## 2016-01-01 DIAGNOSIS — Z9119 Patient's noncompliance with other medical treatment and regimen: Secondary | ICD-10-CM | POA: Diagnosis not present

## 2016-01-01 DIAGNOSIS — D509 Iron deficiency anemia, unspecified: Secondary | ICD-10-CM | POA: Diagnosis not present

## 2016-01-01 DIAGNOSIS — F10239 Alcohol dependence with withdrawal, unspecified: Secondary | ICD-10-CM | POA: Diagnosis not present

## 2016-01-01 LAB — GLUCOSE, CAPILLARY
GLUCOSE-CAPILLARY: 133 mg/dL — AB (ref 65–99)
GLUCOSE-CAPILLARY: 181 mg/dL — AB (ref 65–99)
Glucose-Capillary: 124 mg/dL — ABNORMAL HIGH (ref 65–99)
Glucose-Capillary: 239 mg/dL — ABNORMAL HIGH (ref 65–99)

## 2016-01-01 MED ORDER — SODIUM CHLORIDE 0.9 % IV SOLN
510.0000 mg | Freq: Once | INTRAVENOUS | Status: AC
  Administered 2016-01-01: 510 mg via INTRAVENOUS
  Filled 2016-01-01: qty 17

## 2016-01-01 NOTE — Clinical Social Work Note (Signed)
Patient a/o x3. BSW intern has attempted, multiple times to contact patient's significant other, Briscoe Deutscher. BSW intern has been unable to reach [name]. BSW intern left a detailed message on voicemail regarding d/c disposition for patient. BSW intern has left call back number and asked that [name] return call at earliest convenience.  BSW intern remains available.  [place] intern [phone]

## 2016-01-01 NOTE — Clinical Social Work Note (Signed)
BSW intern has spoken with patient's friend, William [name] to present bed offers. At this time, patient's friend prefers to speak with patient's significant other, William [name] before making final disposition. Patient and patient's family understand that due to inappropriate behavior, patient is unable to return to ALF. Per patient's friend, patient has previously been a resident at [facility]. For sake of convenience purposes, patient's family is considering [facility]. BSW intern to make CSW aware of patient's status. Patient's date of d/c unknown at this time.   BSW intern remains available.  [place] intern  [phone]

## 2016-01-01 NOTE — Clinical Social Work Placement (Signed)
   CLINICAL SOCIAL WORK PLACEMENT  NOTE  Date:  01/01/2016  Patient Details  Name: William [name] MRN: [id number] Date of Birth: [birth year]  Clinical Social Work is seeking post-discharge placement for this patient at the [place] level of care (*CSW will initial, date and re-position this form in  chart as items are completed):  Yes   Patient/family provided with [facility] Work Department's list of facilities offering this level of care within the geographic area requested by the patient (or if unable, by the patient's family).  Yes   Patient/family informed of their freedom to choose among providers that offer the needed level of care, that participate in Medicare, Medicaid or managed care program needed by the patient, have an available bed and are willing to accept the patient.  Yes   Patient/family informed of [facility]'s ownership interest in [facility] and [facility], as well as of the fact that they are under no obligation to receive care at these facilities.  PASRR submitted to EDS on       PASRR number received on       Existing PASRR number confirmed on       FL2 transmitted to all facilities in geographic area requested by pt/family on 01/01/16     FL2 transmitted to all facilities within larger geographic area on       Patient informed that his/her managed care company has contracts with or will negotiate with certain facilities, including the following:            Patient/family informed of bed offers received.  Patient chooses bed at       Physician recommends and patient chooses bed at      Patient to be transferred to   on  .  Patient to be transferred to facility by       Patient family notified on   of transfer.  Name of family member notified:        PHYSICIAN Please sign FL2     Additional Comment:    _______________________________________________ Leane Call, Student-SW 01/01/2016, 10:19  AM

## 2016-01-01 NOTE — Progress Notes (Signed)
Patient ID: William [name], male   DOB: 1946/07/19, 70 y.o.   MRN: [id number] Medicine attending: I examined this patient this morning together with resident physician Dr. Liberty Handy and I concur with his evaluation and management plan which we discussed together. He is rambunctious but stable. He wants to get out of the hospital. He has had no seizure activity since admission. We have him on Depakote with when necessary lorazepam. We appreciate GI consultation. We agree that at least a component of his anemia is due to chronic renal insufficiency. However, he is iron depleted. This may just be dietary. Endoscopic procedures are not recommended at this time. If we can reestablish an IV, we will give him IV iron replacement. Of note he pulled out his own IV yesterday. Our case managers are working with the family and hope to have a disposition soon. His disruptive behavior may not be tolerated whereever he gets placed.

## 2016-01-01 NOTE — Progress Notes (Signed)
Subjective:  Mr. Satterwhite was boisterous but in good spirits this morning and was wondering when he would be leaving. He understood that he would not be returning back to his previous ALF. He had no acute complaints other than expressing a desire to leave.  Objective: Vital signs in last 24 hours: Filed Vitals:   12/31/15 2200 01/01/16 0145 01/01/16 0504 01/01/16 0945  BP: 136/76 171/87 158/81 148/96  Pulse: 58 74 75 81  Temp: 98.8 F (37.1 C) 99.1 F (37.3 C) 98.6 F (37 C)   TempSrc: Oral Oral Oral   Resp: 16 16 18 18   SpO2: 99% 99% 100% 98%   Weight change:   Intake/Output Summary (Last 24 hours) at 01/01/16 1326 Last data filed at 12/31/15 1840  Gross per 24 hour  Intake    480 ml  Output      0 ml  Net    480 ml    General: Lying in bed, NAD HEENT: PERRL, no scleral icterus. Moist mucous membranes. Cardiac: RRR, no m/r/g Pulm: clear to auscultation bilaterally, no wheezes, rales, or rhonchi Abd: soft, nontender, nondistended, BS present Ext: warm and well perfused, no pedal edema Neuro: Alert. Oriented to person, type of place (hospital). He said it was 84. When asked about the president, he said, "I don't mess with those people." Very hard of hearing. 5/5 strength in all extremities. No tremors.   Lab Results: Basic Metabolic Panel:  Recent Labs Lab 12/29/15 2240 12/30/15 1252 12/30/15 1655  NA 135  --   --   K 4.2  --   --   CL 102  --   --   CO2 20*  --   --   GLUCOSE 209*  --   --   BUN 10  --   --   CREATININE 1.55*  --  1.55*  CALCIUM 8.6*  --   --   MG  --  1.7  --    CBC:  Recent Labs Lab 12/29/15 2240 12/30/15 1655  WBC 7.0 7.4  HGB 9.1* 9.9*  HCT 28.7* 31.6*  MCV 85.2 86.3  PLT 194 215   CBG:  Recent Labs Lab 12/31/15 0644 12/31/15 1123 12/31/15 1722 12/31/15 2219 01/01/16 0714 01/01/16 1237  GLUCAP 152* 175* 131* 170* 133* 124*    Recent Labs Lab 12/29/15 2240  ETH <5   Urinalysis:  Recent Labs Lab  12/30/15 0133  COLORURINE YELLOW  LABSPEC 1.016  PHURINE 8.5*  GLUCOSEU 500*  HGBUR NEGATIVE  BILIRUBINUR NEGATIVE  KETONESUR NEGATIVE  PROTEINUR 30*  NITRITE NEGATIVE  LEUKOCYTESUR NEGATIVE   Studies/Results: No results found. Medications: I have reviewed the patient's current medications. Scheduled Meds: . aspirin EC  81 mg Oral Daily  . enoxaparin (LOVENOX) injection  40 mg Subcutaneous Daily  . ferrous sulfate  325 mg Oral Q breakfast  . folic acid  1 mg Oral Daily  . insulin aspart  0-5 Units Subcutaneous QHS  . insulin aspart  0-9 Units Subcutaneous TID WC  . lisinopril  5 mg Oral Daily  . multivitamin with minerals  1 tablet Oral Daily  . pravastatin  10 mg Oral Daily  . saccharomyces boulardii  250 mg Oral BID  . sodium chloride flush  3 mL Intravenous Q12H  . thiamine  100 mg Oral Daily  . valproic acid  500 mg Oral TID   Continuous Infusions:  PRN Meds:.acetaminophen **OR** acetaminophen, LORazepam **OR** LORazepam Assessment/Plan:  Seizure presumed secondary to alcohol withdrawal:  Patient has no withdrawal signs or symptoms on exam. Behavior is consistent with previous behavior at his old ALF. We will continue his AED regimen as this has worked well in the past. Patient cannot consistently manage his medications, and therefore, would benefit from a SNF treatment milieu  -Continue depakote 500mg  PO TID and Ativan as needed for seizures -Continue Haldol 2mg  IV as needed for sedation if aggressive -Order valproic acid level to be checked in 1 weeks at discharge (4/9) -CIWA monitoring with ativan PRN as above -Continue thiamine and folate supplementation  Iron deficiency anemia: Hgb 9.1 on admission down from previous baseline in 12s. Iron panel in 04/2015 and 10/2015 both consistent with iron deficiency given low ferritin 8, high normal TIBC 417. No signs of active bleeding. Evaluated by gastroenterology who did not recommend workup at this time, opting for  conservative management with feraheme transfusion. - Feraheme IV 510 mg x1 -Continue ferrous sulfate 375mg  daily  Alzheimers Dementia: He is alert and oriented x3 at baseline. He has severe gait deficit and is bedbound. He is chronically incontinent of urine and stool wearing adult diapers. He requires assistance with ADLs bathing and dressing.   DM2: Last HgbA1c 6.4% from 12/2013. CBG 209 on presentation. He takes glipizide and metformin as outpatient medications.  -Continue SSI-S  HTN: Continue home Lisinopril 5 mg daily   HLD: Continue home Pravastatin 10 mg daily   Dispo: Previous ALF [facility] not amenable to receiving patient again. Social work is trying to identify a SNF that is appropriate for him.  The patient does not have a current PCP (No primary care provider on file.) and does need an [facility] hospital follow-up appointment after discharge.  The patient does not have transportation limitations that hinder transportation to clinic appointments.  .Services Needed at time of discharge: Y = Yes, Blank = No PT:   OT:   RN:   Equipment:   Other:     LOS: 2 days   Liberty Handy, MD 01/01/2016, 1:26 PM
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
## 2016-01-02 DIAGNOSIS — D509 Iron deficiency anemia, unspecified: Secondary | ICD-10-CM | POA: Diagnosis not present

## 2016-01-02 DIAGNOSIS — F10239 Alcohol dependence with withdrawal, unspecified: Secondary | ICD-10-CM | POA: Diagnosis not present

## 2016-01-02 DIAGNOSIS — Z9119 Patient's noncompliance with other medical treatment and regimen: Secondary | ICD-10-CM | POA: Diagnosis not present

## 2016-01-02 DIAGNOSIS — R569 Unspecified convulsions: Secondary | ICD-10-CM | POA: Diagnosis not present

## 2016-01-02 DIAGNOSIS — H9190 Unspecified hearing loss, unspecified ear: Secondary | ICD-10-CM | POA: Insufficient documentation

## 2016-01-02 DIAGNOSIS — G40509 Epileptic seizures related to external causes, not intractable, without status epilepticus: Secondary | ICD-10-CM | POA: Diagnosis not present

## 2016-01-02 LAB — GLUCOSE, CAPILLARY
Glucose-Capillary: 159 mg/dL — ABNORMAL HIGH (ref 65–99)
Glucose-Capillary: 186 mg/dL — ABNORMAL HIGH (ref 65–99)

## 2016-01-02 MED ORDER — VALPROATE SODIUM 250 MG/5ML PO SYRP: 500.0000 mg | ORAL_SOLUTION | Freq: Three times a day (TID) | ORAL | Status: DC

## 2016-01-02 MED ORDER — FERROUS SULFATE 325 (65 FE) MG PO TABS: 325.0000 mg | ORAL_TABLET | Freq: Every day | ORAL | Status: DC

## 2016-01-02 NOTE — Clinical Social Work Note (Signed)
Patient will discharge today per MD order. Patient will discharge to: [facility] RN to call report prior to transportation to: [phone] Transportation:[facility] to be called at 2:45pm - RN aware  CSW sent discharge summary to SNF for review.  Packet is complete.  RN, patient and family aware of discharge plans.  Nonnie Done, LCSW [phone]  5N1-9, 2S 15-16 and Psychiatric Service Line  Licensed Clinical Social Worker

## 2016-01-02 NOTE — Progress Notes (Signed)
Report given to nurse at [facility]. Pt left via stretcher with [facility] at 1535. Wendee Copp

## 2016-01-02 NOTE — Discharge Instructions (Signed)
[name]. It was a pleasure seeing you in the hospital.  You had a seizure. This was likely do to either not taking depakote or perhaps alcohol withdrawal.  Please get your depakote level checked in a week.  Please abstain from alcohol.

## 2016-01-02 NOTE — Progress Notes (Signed)
Pt discharging to skilled nursing facility. IV removed. Attempted to call report to SNF, no answer at assigned nurse desk. Left phone number to call back with secretary. Wendee Copp

## 2016-01-02 NOTE — Care Management Note (Signed)
Case Management Note  Patient Details  Name: William [name] MRN: [id number] Date of Birth: 1946-01-02  Subjective/Objective:                    Action/Plan: Patient discharging to [facility] SNF today. No further needs per CM.   Expected Discharge Date:                  Expected Discharge Plan:  Skilled Nursing Facility  In-House Referral:  Clinical Social Work  Discharge planning Services     Post Acute Care Choice:    Choice offered to:     DME Arranged:    DME Agency:     HH Arranged:    [place] Agency:     Status of Service:  Completed, signed off  Medicare Important Message Given:    Date Medicare IM Given:    Medicare IM give by:    Date Additional Medicare IM Given:    Additional Medicare Important Message give by:     If discussed at [place] of Stay Meetings, dates discussed:    Additional Comments:  Pollie Friar, RN 01/02/2016, 3:23 PM

## 2016-01-02 NOTE — Progress Notes (Signed)
Subjective:  Patient says he is ready to go to his new facility. He has no new complaints.  Objective: Vital signs in last 24 hours: Filed Vitals:   01/01/16 2056 01/02/16 0100 01/02/16 0612 01/02/16 1000  BP: 118/84 156/85 157/89 144/87  Pulse: 62 77 60 64  Temp: 98.8 F (37.1 C) 98.4 F (36.9 C) 98.2 F (36.8 C) 98 F (36.7 C)  TempSrc: Axillary Axillary Oral Oral  Resp: 17 19 18 18   SpO2: 98% 98% 100% 100%   Weight change:   Intake/Output Summary (Last 24 hours) at 01/02/16 1312 Last data filed at 01/01/16 2059  Gross per 24 hour  Intake    250 ml  Output      0 ml  Net    250 ml    General: Lying in bed, NAD Cardiac: RRR, no m/r/g Pulm: clear to auscultation bilaterally, no wheezes, rales, or rhonchi Abd: soft, nontender, nondistended, BS present Ext: warm and well perfused, no pedal edema Neuro: Alert. Oriented to person only. Very hard of hearing. No tremors.   Lab Results: Basic Metabolic Panel:  Recent Labs Lab 12/29/15 2240 12/30/15 1252 12/30/15 1655  NA 135  --   --   K 4.2  --   --   CL 102  --   --   CO2 20*  --   --   GLUCOSE 209*  --   --   BUN 10  --   --   CREATININE 1.55*  --  1.55*  CALCIUM 8.6*  --   --   MG  --  1.7  --    CBC:  Recent Labs Lab 12/29/15 2240 12/30/15 1655  WBC 7.0 7.4  HGB 9.1* 9.9*  HCT 28.7* 31.6*  MCV 85.2 86.3  PLT 194 215   CBG:  Recent Labs Lab 01/01/16 0714 01/01/16 1237 01/01/16 1724 01/01/16 2102 01/02/16 0616 01/02/16 1107  GLUCAP 133* 124* 181* 239* 159* 186*    Recent Labs Lab 12/29/15 2240  ETH <5   Urinalysis:  Recent Labs Lab 12/30/15 0133  COLORURINE YELLOW  LABSPEC 1.016  PHURINE 8.5*  GLUCOSEU 500*  HGBUR NEGATIVE  BILIRUBINUR NEGATIVE  KETONESUR NEGATIVE  PROTEINUR 30*  NITRITE NEGATIVE  LEUKOCYTESUR NEGATIVE   Studies/Results: No results found. Medications: I have reviewed the patient's current medications. Scheduled Meds: . aspirin EC  81 mg Oral  Daily  . enoxaparin (LOVENOX) injection  40 mg Subcutaneous Daily  . ferrous sulfate  325 mg Oral Q breakfast  . folic acid  1 mg Oral Daily  . insulin aspart  0-5 Units Subcutaneous QHS  . insulin aspart  0-9 Units Subcutaneous TID WC  . lisinopril  5 mg Oral Daily  . multivitamin with minerals  1 tablet Oral Daily  . pravastatin  10 mg Oral Daily  . saccharomyces boulardii  250 mg Oral BID  . sodium chloride flush  3 mL Intravenous Q12H  . thiamine  100 mg Oral Daily  . valproic acid  500 mg Oral TID   Continuous Infusions:  PRN Meds:.acetaminophen **OR** acetaminophen Assessment/Plan:  Seizure: Either due to alcohol withdrawal, known seizure focus, or both. However, patient currently has no withdrawal signs or symptoms on exam other than a degree of agitation. Behavior is consistent with previous behavior at his old ALF.  -Continue depakote 500mg  PO TID -Continue Haldol 2mg  IV as needed for sedation if aggressive -Order valproic acid level to be checked in 1 weeks at discharge (4/9) -  CIWA monitoring with ativan PRN as above -Continue thiamine and folate supplementation  Iron deficiency anemia: Hgb 9.1 on admission down from previous baseline in 12s. Iron panel in 04/2015 and 10/2015 both consistent with iron deficiency given low ferritin 8, high normal TIBC 417. No signs of active bleeding. Evaluated by gastroenterology who did not recommend workup at this time. Feraheme transfusion yesterday. -Continue ferrous sulfate 375mg  daily   Dispo: Anticipated discharge to SNF today.  The patient does not have a current PCP (No primary care provider on file.) and does need an [facility] hospital follow-up appointment after discharge.  The patient does not have transportation limitations that hinder transportation to clinic appointments.  .Services Needed at time of discharge: Y = Yes, Blank = No PT:   OT:   RN:   Equipment:   Other:     LOS: 3 days   Liberty Handy, MD 01/02/2016, 1:12  PM

## 2016-01-02 NOTE — Discharge Summary (Signed)
Name: William [name] MRN: [id number] DOB: August 06, 1946 70 y.o. PCP: No primary care provider on file.  Date of Admission: 12/29/2015 10:04 PM Date of Discharge: 01/02/2016 Attending Physician: Annia Belt, MD  Discharge Diagnosis: 1. Seizure Disorder 2. Alcohol Withdrawal 3. Iron Deficiency Anemia 4. Alzheimer's Dementia 5. T2DM 6. HTN 7. HLD  Discharge Medications:   Medication List    TAKE these medications        aspirin EC 81 MG EC tablet  Generic drug:  aspirin  Take 81 mg by mouth daily. Swallow whole.     ferrous sulfate 325 (65 FE) MG tablet  Take 1 tablet (325 mg total) by mouth daily with breakfast.     FLORASTOR PO  Take 1 tablet by mouth 2 (two) times daily.     folic acid 1 MG tablet  Commonly known as:  FOLVITE  Take 1 tablet (1 mg total) by mouth daily.     glipiZIDE 10 MG tablet  Commonly known as:  GLUCOTROL  Take 10 mg by mouth 2 (two) times daily before a meal.     lisinopril 5 MG tablet  Commonly known as:  PRINIVIL,ZESTRIL  Take 5 mg by mouth daily.     metFORMIN 1000 MG tablet  Commonly known as:  GLUCOPHAGE  Take 1,000 mg by mouth 2 (two) times daily with a meal.     pravastatin 10 MG tablet  Commonly known as:  PRAVACHOL  Take 10 mg by mouth daily.     thiamine 100 MG tablet  Take 1 tablet (100 mg total) by mouth daily.     valproic acid 250 MG/5ML syrup  Commonly known as:  DEPAKENE  Take 10 mLs (500 mg total) by mouth 3 (three) times daily.        Disposition and follow-up:   William [name] was discharged from [facility] in Stable condition.  At the hospital follow up visit please address:  1.  Patient had not been taking depakote for treatment of known seizure disorder. This was discontinued for an unclear reason. He was started on depakene 500 mg TID while hospitalized. He needs a valproic acid level checked by April 9th 2017.  2. Patient's seizure may also be related to alcohol  withdrawal, as empty whiskey bottles were found at his ALF. He had no significant withdrawal signs during this hospitalization, and his CIWAs were consistently under 5.   3. Patient has known iron deficiency anemia. He received Feraheme and was started in Ferrous 325 mg daily. His hemoglobin was in the 9's this admission. GI evaluated him as an inpatient and did not recommend colonoscopy.  4. Patient is EXTREMELY hard of hearing. Hearing aids would improve his quality of life.  5.  Labs / imaging needed at time of follow-up: Valproic Acid Level, CBC  6.  Pending labs/ test needing follow-up: None  Follow-up Appointments: Follow-up Information    Follow up with [facility]. [facility] on 01/07/2016.   Why:  Appointment at 10 AM with Dr. Jannifer [name]. Check valproic acid level.   Contact information:   [address]     [place] [phone]      Discharge Instructions  [name]. It was a pleasure seeing you in the hospital.  You had a seizure. You had a brain test in 2015 suggesting that you have a seizure disorder. Please get your depakote level checked in a week.  Please abstain from alcohol.  Consultations:    Procedures Performed:  Dg Chest 1 View  12/30/2015  CLINICAL DATA:  Status post grand mal seizure. Found on floor. Initial encounter. EXAM: CHEST 1 VIEW COMPARISON:  Chest radiograph performed 11/19/2015 FINDINGS: The lungs are well-aerated. Mild bibasilar atelectasis is noted. Pulmonary vascularity is at the upper limits of normal. There is no evidence of pleural effusion or pneumothorax. The cardiomediastinal silhouette is mildly enlarged. No acute osseous abnormalities are seen. Degenerative change is noted at the left glenohumeral joint. Chronic left rib deformities are noted. IMPRESSION: Mild bibasilar atelectasis noted.  Mild cardiomegaly. Electronically Signed   By: Garald Balding M.D.   On: 12/30/2015 01:30   Ct Head  Wo Contrast  12/30/2015  CLINICAL DATA:  Status post right mal seizure. Found on floor, with hematoma at the top of the head. Concern for cervical spine injury. Initial encounter. EXAM: CT HEAD WITHOUT CONTRAST CT CERVICAL SPINE WITHOUT CONTRAST TECHNIQUE: Multidetector CT imaging of the head and cervical spine was performed following the standard protocol without intravenous contrast. Multiplanar CT image reconstructions of the cervical spine were also generated. COMPARISON:  CT of the head performed 11/19/2015, CT of the cervical spine performed 09/02/2012, and MRI of the brain performed 05/10/2013 FINDINGS: CT HEAD FINDINGS There is no evidence of acute infarction, mass lesion, or intra- or extra-axial hemorrhage on CT. Prominence of the ventricles and sulci reflects mild cortical volume loss. Mild cerebellar atrophy is noted. Scattered periventricular and subcortical white matter change likely reflects small vessel ischemic microangiopathy. The brainstem and fourth ventricle are within normal limits. The basal ganglia are unremarkable in appearance. The cerebral hemispheres demonstrate grossly normal gray-white differentiation. No mass effect or midline shift is seen. There is no evidence of fracture; visualized osseous structures are unremarkable in appearance. The visualized portions of the orbits are within normal limits. Mucosal thickening is noted at the right maxillary sinus. The remaining paranasal sinuses and mastoid air cells are well-aerated. No significant soft tissue abnormalities are seen. CT CERVICAL SPINE FINDINGS There is no evidence of fracture or subluxation. Vertebral bodies demonstrate normal height and alignment. Intervertebral disc spaces are preserved. Prevertebral soft tissues are within normal limits. Scattered anterior and posterior disc osteophyte complexes noted along the cervical spine, with mild sclerotic change, and fusion at the posterior elements at C3-C4. The thyroid gland is  unremarkable in appearance. The visualized lung apices are clear. No significant soft tissue abnormalities are seen. IMPRESSION: 1. No evidence of traumatic intracranial injury or fracture. 2. No evidence of fracture or subluxation along the cervical spine. 3. Mild cortical volume loss and scattered small vessel ischemic microangiopathy. 4. Mild degenerative change along the cervical spine. 5. Mucosal thickening at the right maxillary sinus. Electronically Signed   By: Garald Balding M.D.   On: 12/30/2015 01:12   Ct Cervical Spine Wo Contrast  12/30/2015  CLINICAL DATA:  Status post right mal seizure. Found on floor, with hematoma at the top of the head. Concern for cervical spine injury. Initial encounter. EXAM: CT HEAD WITHOUT CONTRAST CT CERVICAL SPINE WITHOUT CONTRAST TECHNIQUE: Multidetector CT imaging of the head and cervical spine was performed following the standard protocol without intravenous contrast. Multiplanar CT image reconstructions of the cervical spine were also generated. COMPARISON:  CT of the head performed 11/19/2015, CT of the cervical spine performed 09/02/2012, and MRI of the brain performed 05/10/2013 FINDINGS: CT HEAD FINDINGS There is no evidence of acute infarction, mass lesion, or intra- or extra-axial hemorrhage on CT. Prominence of  the ventricles and sulci reflects mild cortical volume loss. Mild cerebellar atrophy is noted. Scattered periventricular and subcortical white matter change likely reflects small vessel ischemic microangiopathy. The brainstem and fourth ventricle are within normal limits. The basal ganglia are unremarkable in appearance. The cerebral hemispheres demonstrate grossly normal gray-white differentiation. No mass effect or midline shift is seen. There is no evidence of fracture; visualized osseous structures are unremarkable in appearance. The visualized portions of the orbits are within normal limits. Mucosal thickening is noted at the right maxillary sinus.  The remaining paranasal sinuses and mastoid air cells are well-aerated. No significant soft tissue abnormalities are seen. CT CERVICAL SPINE FINDINGS There is no evidence of fracture or subluxation. Vertebral bodies demonstrate normal height and alignment. Intervertebral disc spaces are preserved. Prevertebral soft tissues are within normal limits. Scattered anterior and posterior disc osteophyte complexes noted along the cervical spine, with mild sclerotic change, and fusion at the posterior elements at C3-C4. The thyroid gland is unremarkable in appearance. The visualized lung apices are clear. No significant soft tissue abnormalities are seen. IMPRESSION: 1. No evidence of traumatic intracranial injury or fracture. 2. No evidence of fracture or subluxation along the cervical spine. 3. Mild cortical volume loss and scattered small vessel ischemic microangiopathy. 4. Mild degenerative change along the cervical spine. 5. Mucosal thickening at the right maxillary sinus. Electronically Signed   By: Garald Balding M.D.   On: 12/30/2015 01:12    Admission HPI: Mr. Mazor is a 70 yo male with PMHx of T2DM, CKD3, Seizures, alcohol abuse, HTN who presented today from his ALF, [facility], after being observed to have a seizure. Seizure was described by staff as a grand mal seizure lasting for 10 minutes. Patient was given Versed 5 mg IM. Since arrival, patient has been somnolent but arousable. He is awake and oriented x 1 (to self). He admits to head and neck pain and fatigue. Patient does not remember having a seizure nor does he remember having a history of seizures. He states he has not drank alcohol in a long time. He estimates 4-5 months. He did hit his head after falling and complains of mild pain. Patient did not have a recurrent seizure since arrival at the ED.  On chart review, patient has a history of alcohol withdrawal seizures. Patient has had at least 3 prior seizures documented. At least one of which was  treated in the hospital in May 2015. Patient was discharged on Valproate 500 mg TID. This medication appears to have been discontinued at some point as patient was non-compliant and had not been taking the medicine. He was last seen here in February with acute encephalopathy due to UTI.  Hospital Course by problem list:  Seizure Disorder: Patient had an EEG in 2015 demonstrating an epileptogenic focus in the left frontotemporal region. At that time, he was started on depakote. Since then, his depakote was discontinued for an unclear reason. Moreover, the patient has a history of alcohol use, and empty whiskey bottles were found at his previous ALF, invoking the possibility of an alcohol withdrawal seizure. During this admission, the patient displayed no clear signs of alcohol withdrawal other than a degree of anxiety and agitation.  Depakene 300 mg TID was initiated, which he tolerated well. There was not witnessed seizure activity during this hospitalization. A follow up appointment was made with [facility] on discharge.  Alcohol Withdrawal: Patient displayed no tremors, clear signs of hallucinosis. His CIWA reached 8 once, but no higher.  For 24 hours prior to discharge, his CIWA was zero.  Iron Deficiency Anemia: Patient had normocytic anemia and was evaluated by Gastroenterology, who did not recommend colonoscopy given his known IDA. He received IV feraheme and was started on ferrous sulfate.  T2DM: He was on SSI while inpatient and discharged on home glipizide and metformin on discharge.  Discharge Vitals:   BP 144/87 mmHg  Pulse 64  Temp(Src) 98 F (36.7 C) (Oral)  Resp 18  SpO2 100%  Discharge Labs:  Results for orders placed or performed during the hospital encounter of 12/29/15 (from the past 24 hour(s))  Glucose, capillary     Status: Abnormal   Collection Time: 01/01/16  5:24 PM  Result Value Ref Range   Glucose-Capillary 181 (H) 65 - 99 mg/dL   Comment 1  Document in Chart   Glucose, capillary     Status: Abnormal   Collection Time: 01/01/16  9:02 PM  Result Value Ref Range   Glucose-Capillary 239 (H) 65 - 99 mg/dL   Comment 1 Notify RN    Comment 2 Document in Chart   Glucose, capillary     Status: Abnormal   Collection Time: 01/02/16  6:16 AM  Result Value Ref Range   Glucose-Capillary 159 (H) 65 - 99 mg/dL   Comment 1 Notify RN    Comment 2 Document in Chart   Glucose, capillary     Status: Abnormal   Collection Time: 01/02/16 11:07 AM  Result Value Ref Range   Glucose-Capillary 186 (H) 65 - 99 mg/dL    Signed: Liberty Handy, MD 01/02/2016, 2:01 PM

## 2016-01-02 NOTE — Clinical Social Work Placement (Addendum)
   CLINICAL SOCIAL WORK PLACEMENT  NOTE  Date:  01/02/2016  Patient Details  Name: William [name] MRN: [id number] Date of Birth: 10/15/1945  Clinical Social Work is seeking post-discharge placement for this patient at the [place] level of care (*CSW will initial, date and re-position this form in  chart as items are completed):  Yes   Patient/family provided with [facility] Work Department's list of facilities offering this level of care within the geographic area requested by the patient (or if unable, by the patient's family).  Yes   Patient/family informed of their freedom to choose among providers that offer the needed level of care, that participate in Medicare, Medicaid or managed care program needed by the patient, have an available bed and are willing to accept the patient.  Yes   Patient/family informed of [facility]'s ownership interest in [facility] and [facility] as well as of the fact that they are under no obligation to receive care at these facilities.  PASRR submitted to EDS on       PASRR number received on       Existing PASRR number confirmed on 01/02/16 (PASARR level O and is active (not expired) level II)     FL2 transmitted to all facilities in geographic area requested by pt/family on 01/01/16     FL2 transmitted to all facilities within larger geographic area on       Patient informed that his/her managed care company has contracts with or will negotiate with certain facilities, including the following:        Yes   Patient/family informed of bed offers received.  Patient chooses bed at [facility] recommends and patient chooses bed at      Patient to be transferred to [facility] on 01/02/16.  Patient to be transferred to facility by [facility]     Patient family notified on 01/02/16 of transfer.  Name of family member notified:  William [name] -s/o     PHYSICIAN Please  sign FL2     Additional Comment:    _______________________________________________ Dulcy Fanny, LCSW 01/02/2016, 2:18 PM

## 2016-01-03 ENCOUNTER — Encounter: Payer: Self-pay | Admitting: Internal Medicine

## 2016-01-03 ENCOUNTER — Non-Acute Institutional Stay (SKILLED_NURSING_FACILITY): Payer: Medicare Other | Admitting: Internal Medicine

## 2016-01-03 DIAGNOSIS — I10 Essential (primary) hypertension: Secondary | ICD-10-CM | POA: Diagnosis not present

## 2016-01-03 DIAGNOSIS — F411 Generalized anxiety disorder: Secondary | ICD-10-CM

## 2016-01-03 DIAGNOSIS — R569 Unspecified convulsions: Secondary | ICD-10-CM | POA: Diagnosis not present

## 2016-01-03 DIAGNOSIS — F1010 Alcohol abuse, uncomplicated: Secondary | ICD-10-CM | POA: Diagnosis not present

## 2016-01-03 DIAGNOSIS — E1122 Type 2 diabetes mellitus with diabetic chronic kidney disease: Secondary | ICD-10-CM

## 2016-01-03 DIAGNOSIS — Z720 Tobacco use: Secondary | ICD-10-CM

## 2016-01-03 DIAGNOSIS — N183 Chronic kidney disease, stage 3 unspecified: Secondary | ICD-10-CM

## 2016-01-03 DIAGNOSIS — D509 Iron deficiency anemia, unspecified: Secondary | ICD-10-CM | POA: Diagnosis not present

## 2016-01-03 DIAGNOSIS — F29 Unspecified psychosis not due to a substance or known physiological condition: Secondary | ICD-10-CM | POA: Diagnosis not present

## 2016-01-03 NOTE — Progress Notes (Signed)
Patient ID: William [name], male   DOB: Nov 17, 1945, 70 y.o.   MRN: [id number]    HISTORY AND PHYSICAL   DATE: 01/03/16  Location:  [facility] Room Number: 528 Place of Service: SNF (31)   Extended Emergency Contact Information Primary Emergency Contact: Collier Flowers, [place] Phone: [phone] Relation: Friend Secondary Emergency Contact: [place] [address] [place] Phone: [phone] Relation: Significant other  Advanced Directive information Does patient have an advance directive?: No, Would patient like information on creating an advanced directive?: No - patient declined information  FULL CODE  Chief Complaint  Patient presents with  . New Admit To SNF    HPI:  70 yo male seen today as a new admission into SNF following hospital stay for EToh withdrawal, seizure d/o, Fe deficiency anemia, Hearing loss, CKD, DM, HTN and hyperlipidemia. He presented to the ED with altered MS and Etoh protocol initiated. Several bottles of whiskey found in ALF room. He became belligerant and verbally abusive to hospital staff. For seizures, he was started on depakote. Last EEG in 2015 revealed epileptogenic focus in left frontotemporal region. He was though to be noncompliant with medications in the past. Ferritin level 8 and he was given 1 dose IV iron. GI consulted and no EGD recommended as he was not a candidate. ASA stopped. Hgb 9.9 at d/c with Cr 1.55. He was sent to SNF for further rehab  Nursing c/a pt's behavior - he has been throwing equipment (eg, walker) and attempting to hit staff. He has been using racial slurs and being verbally abusive. Ativan ordered by on call provider last night. Today, DON had to call EMS and police due to his behavior as pt would not calm down by verbal cues. He was given po ativan about 10 min ago and is much more calm now. He was also given  chewing tobacco which helped as he was asking for it.  Pt is currently nonverbal and lethargic but easily awakened. He is very HOH.  He is a poor historian and hx obtained from chart  Hyperlipidemia - stable on pravastatin  DM - no low BS reactions on metformin and glipizide  Etoh abuse - no w/d signs. Takes thiamine and folate  Anemia - Hgb stable. Takes iron supplement  HTN - BP stable on lisinopril. Takes ASA daily  Sz d/o - none observed on depakote  Past Medical History  Diagnosis Date  . Diabetes mellitus type II   . Hyperlipemia   . Depression with anxiety   . HTN (hypertension)   . Cataract   . Alcohol withdrawal seizure ([place]) 2013  . Diabetes mellitus without complication ([place])   . Hypertension   . Hearing impairment   . Cataract   . HTN (hypertension), benign 02/26/2014  . Type II or unspecified type diabetes mellitus with neurological manifestations, not stated as uncontrolled 02/26/2014  . Chronic renal insufficiency, stage III (moderate) 02/26/2014  . Alcohol abuse 2009    dependency as exhibited by ETOH withdrawal seizure.   . Anemia 2011    Normocytic.     Past Surgical History  Procedure Laterality Date  . No past surgeries      No care team member to display  Social History   Social History  . Marital Status: Single    Spouse Name: N/A  .  Number of Children: N/A  . Years of Education: N/A   Occupational History  . Not on file.   Social History Main Topics  . Smoking status: Current Every Day Smoker    Types: Cigars  . Smokeless tobacco: Current User    Types: Chew  . Alcohol Use: Yes     Comment: 04/2015 I have not drank in over a month "  . Drug Use: No  . Sexual Activity: Not on file   Other Topics Concern  . Not on file   Social History Narrative   ** Merged History Encounter **         reports that he has been smoking Cigars.  His smokeless tobacco use includes Chew. He reports that he drinks alcohol. He reports that he does not  use illicit drugs.  Family History  Problem Relation Age of Onset  . Family history unknown: Yes   No family status information on file.    Immunization History  Administered Date(s) Administered  . Influenza Split 06/07/2012  . Influenza,inj,Quad PF,36+ Mos 12/16/2013  . Pneumococcal Polysaccharide-23 05/17/2009, 05/30/2012, 12/16/2013  . Td 05/17/2009  . Tdap 11/29/2011    No Known Allergies  Medications: Patient's Medications  New Prescriptions   No medications on file  Previous Medications   ASPIRIN (ASPIRIN EC) 81 MG EC TABLET    Take 81 mg by mouth daily. Swallow whole.   FERROUS SULFATE 325 (65 FE) MG TABLET    Take 1 tablet (325 mg total) by mouth daily with breakfast.   FOLIC ACID (FOLVITE) 1 MG TABLET    Take 1 tablet (1 mg total) by mouth daily.   GLIPIZIDE (GLUCOTROL) 10 MG TABLET    Take 10 mg by mouth 2 (two) times daily before a meal.   LISINOPRIL (PRINIVIL,ZESTRIL) 5 MG TABLET    Take 5 mg by mouth daily.   LORAZEPAM (ATIVAN) 0.5 MG TABLET    Take 0.5 mg by mouth 2 (two) times daily as needed for anxiety.   METFORMIN (GLUCOPHAGE) 1000 MG TABLET    Take 1,000 mg by mouth 2 (two) times daily with a meal.   NICOTINE (NICODERM CQ - DOSED IN MG/24 HOURS) 21 MG/24HR PATCH    Place 21 mg onto the skin daily.   PRAVASTATIN (PRAVACHOL) 10 MG TABLET    Take 10 mg by mouth daily.   SACCHAROMYCES BOULARDII (FLORASTOR PO)    Take 1 tablet by mouth 2 (two) times daily.   THIAMINE 100 MG TABLET    Take 1 tablet (100 mg total) by mouth daily.   VALPROIC ACID (DEPAKENE) 250 MG/5ML SYRUP    Take 10 mLs (500 mg total) by mouth 3 (three) times daily.  Modified Medications   No medications on file  Discontinued Medications   No medications on file    Review of Systems  Unable to perform ROS: Patient nonverbal   and lethargic after lorazepam; VERY HOH  Filed Vitals:   01/03/16 1154  BP: 130/82  Pulse: 61  Temp: 98.4 F (36.9 C)  TempSrc: Oral  Resp: 16  Height: _0   (1.702 m)  Weight: 190 lb (86.183 kg)   Body mass index is 29.75 kg/(m^2).  Physical Exam  Constitutional: He appears well-developed.  Frail appearing in NAD, lying in bed quietly. EMTs and police officers present. Prn lorazepam dose given 10 minutes prior to exam  HENT:  Mouth/Throat: Oropharynx is clear and moist.  Eyes: Pupils are equal, round, and reactive to light.  No scleral icterus.  Neck: Neck supple. Carotid bruit is not present. No thyromegaly present.  Cardiovascular: Normal rate, regular rhythm and intact distal pulses.  Exam reveals no gallop and no friction rub.   Murmur (1/6 SEM) heard. no distal LE swelling. No calf TTP  Pulmonary/Chest: Effort normal and breath sounds normal. He has no wheezes. He has no rales. He exhibits no tenderness.  Abdominal: Soft. Bowel sounds are normal. He exhibits no distension, no abdominal bruit, no pulsatile midline mass and no mass. There is no tenderness. There is no rebound and no guarding.  Lymphadenopathy:    He has no cervical adenopathy.  Neurological: He is alert.  Skin: Skin is warm and dry. No rash noted.  Psychiatric: He has a normal mood and affect. He is agitated.     Labs reviewed: Admission on 12/29/2015, Discharged on 01/02/2016  Component Date Value Ref Range Status  . Glucose-Capillary 12/29/2015 192* 65 - 99 mg/dL Final  . WBC 12/29/2015 7.0  4.0 - 10.5 K/uL Final  . RBC 12/29/2015 3.37* 4.22 - 5.81 MIL/uL Final  . Hemoglobin 12/29/2015 9.1* 13.0 - 17.0 g/dL Final  . HCT 12/29/2015 28.7* 39.0 - 52.0 % Final  . MCV 12/29/2015 85.2  78.0 - 100.0 fL Final  . MCH 12/29/2015 27.0  26.0 - 34.0 pg Final  . MCHC 12/29/2015 31.7  30.0 - 36.0 g/dL Final  . RDW 12/29/2015 14.0  11.5 - 15.5 % Final  . Platelets 12/29/2015 194  150 - 400 K/uL Final  . Sodium 12/29/2015 135  135 - 145 mmol/L Final  . Potassium 12/29/2015 4.2  3.5 - 5.1 mmol/L Final  . Chloride 12/29/2015 102  101 - 111 mmol/L Final  . CO2 12/29/2015 20* 22  - 32 mmol/L Final  . Glucose, Bld 12/29/2015 209* 65 - 99 mg/dL Final  . BUN 12/29/2015 10  6 - 20 mg/dL Final  . Creatinine, Ser 12/29/2015 1.55* 0.61 - 1.24 mg/dL Final  . Calcium 12/29/2015 8.6* 8.9 - 10.3 mg/dL Final  . GFR calc non Af Amer 12/29/2015 43* >60 mL/min Final  . GFR calc Af Amer 12/29/2015 50* >60 mL/min Final   Comment: (NOTE) The eGFR has been calculated using the CKD EPI equation. This calculation has not been validated in all clinical situations. eGFR's persistently <60 mL/min signify possible Chronic Kidney Disease.   . Anion gap 12/29/2015 13  5 - 15 Final  . Color, Urine 12/30/2015 YELLOW  YELLOW Final  . APPearance 12/30/2015 CLOUDY* CLEAR Final  . Specific Gravity, Urine 12/30/2015 1.016  1.005 - 1.030 Final  . pH 12/30/2015 8.5* 5.0 - 8.0 Final  . Glucose, UA 12/30/2015 500* NEGATIVE mg/dL Final  . Hgb urine dipstick 12/30/2015 NEGATIVE  NEGATIVE Final  . Bilirubin Urine 12/30/2015 NEGATIVE  NEGATIVE Final  . Ketones, ur 12/30/2015 NEGATIVE  NEGATIVE mg/dL Final  . Protein, ur 12/30/2015 30* NEGATIVE mg/dL Final  . Nitrite 12/30/2015 NEGATIVE  NEGATIVE Final  . Leukocytes, UA 12/30/2015 NEGATIVE  NEGATIVE Final  . Alcohol, Ethyl (B) 12/29/2015 <5  <5 mg/dL Final   Comment:        LOWEST DETECTABLE LIMIT FOR SERUM ALCOHOL IS 5 mg/dL FOR MEDICAL PURPOSES ONLY   . Opiates 12/30/2015 NONE DETECTED  NONE DETECTED Final  . Cocaine 12/30/2015 NONE DETECTED  NONE DETECTED Final  . Benzodiazepines 12/30/2015 POSITIVE* NONE DETECTED Final  . Amphetamines 12/30/2015 NONE DETECTED  NONE DETECTED Final  . Tetrahydrocannabinol 12/30/2015 NONE DETECTED  NONE DETECTED Final  .  Barbiturates 12/30/2015 NONE DETECTED  NONE DETECTED Final   Comment:        DRUG SCREEN FOR MEDICAL PURPOSES ONLY.  IF CONFIRMATION IS NEEDED FOR ANY PURPOSE, NOTIFY LAB WITHIN 5 DAYS.        LOWEST DETECTABLE LIMITS FOR URINE DRUG SCREEN Drug Class       Cutoff (ng/mL) Amphetamine       1000 Barbiturate      200 Benzodiazepine   591 Tricyclics       638 Opiates          300 Cocaine          300 THC              50   . Squamous Epithelial / LPF 12/30/2015 6-30* NONE SEEN Final  . WBC, UA 12/30/2015 6-30  0 - 5 WBC/hpf Final  . RBC / HPF 12/30/2015 0-5  0 - 5 RBC/hpf Final  . Bacteria, UA 12/30/2015 FEW* NONE SEEN Final  . Specimen Description 12/30/2015 URINE, RANDOM   Final  . Special Requests 12/30/2015 ADDED 0235   Final  . Culture 12/30/2015 NO GROWTH 1 DAY   Final  . Report Status 12/30/2015 12/31/2015 FINAL   Final  . Glucose-Capillary 12/30/2015 109* 65 - 99 mg/dL Final  . Total Protein 12/30/2015 6.4* 6.5 - 8.1 g/dL Final  . Albumin 12/30/2015 3.5  3.5 - 5.0 g/dL Final  . AST 12/30/2015 22  15 - 41 U/L Final  . ALT 12/30/2015 11* 17 - 63 U/L Final  . Alkaline Phosphatase 12/30/2015 57  38 - 126 U/L Final  . Total Bilirubin 12/30/2015 0.7  0.3 - 1.2 mg/dL Final  . Bilirubin, Direct 12/30/2015 0.1  0.1 - 0.5 mg/dL Final  . Indirect Bilirubin 12/30/2015 0.6  0.3 - 0.9 mg/dL Final  . Magnesium 12/30/2015 1.7  1.7 - 2.4 mg/dL Final  . WBC 12/30/2015 7.4  4.0 - 10.5 K/uL Final  . RBC 12/30/2015 3.66* 4.22 - 5.81 MIL/uL Final  . Hemoglobin 12/30/2015 9.9* 13.0 - 17.0 g/dL Final  . HCT 12/30/2015 31.6* 39.0 - 52.0 % Final  . MCV 12/30/2015 86.3  78.0 - 100.0 fL Final  . MCH 12/30/2015 27.0  26.0 - 34.0 pg Final  . MCHC 12/30/2015 31.3  30.0 - 36.0 g/dL Final  . RDW 12/30/2015 14.1  11.5 - 15.5 % Final  . Platelets 12/30/2015 215  150 - 400 K/uL Final  . Creatinine, Ser 12/30/2015 1.55* 0.61 - 1.24 mg/dL Final  . GFR calc non Af Amer 12/30/2015 43* >60 mL/min Final  . GFR calc Af Amer 12/30/2015 50* >60 mL/min Final   Comment: (NOTE) The eGFR has been calculated using the CKD EPI equation. This calculation has not been validated in all clinical situations. eGFR's persistently <60 mL/min signify possible Chronic Kidney Disease.   . Glucose-Capillary  12/30/2015 162* 65 - 99 mg/dL Final  . MRSA by PCR 12/30/2015 NEGATIVE  NEGATIVE Final   Comment:        The GeneXpert MRSA Assay (FDA approved for NASAL specimens only), is one component of a comprehensive MRSA colonization surveillance program. It is not intended to diagnose MRSA infection nor to guide or monitor treatment for MRSA infections.   . Glucose-Capillary 12/30/2015 152* 65 - 99 mg/dL Final  . Comment 1 12/30/2015 Notify RN   Final  . Comment 2 12/30/2015 Document in Chart   Final  . Glucose-Capillary 12/31/2015 152* 65 - 99 mg/dL Final  .  Comment 1 12/31/2015 Notify RN   Final  . Comment 2 12/31/2015 Document in Chart   Final  . Glucose-Capillary 12/31/2015 175* 65 - 99 mg/dL Final  . Comment 1 12/31/2015 Notify RN   Final  . Comment 2 12/31/2015 Document in Chart   Final  . Glucose-Capillary 12/31/2015 131* 65 - 99 mg/dL Final  . Comment 1 12/31/2015 Notify RN   Final  . Comment 2 12/31/2015 Document in Chart   Final  . Glucose-Capillary 12/31/2015 170* 65 - 99 mg/dL Final  . Comment 1 12/31/2015 Notify RN   Final  . Comment 2 12/31/2015 Document in Chart   Final  . Glucose-Capillary 01/01/2016 133* 65 - 99 mg/dL Final  . Comment 1 01/01/2016 Notify RN   Final  . Comment 2 01/01/2016 Document in Chart   Final  . Glucose-Capillary 01/01/2016 124* 65 - 99 mg/dL Final  . Glucose-Capillary 01/01/2016 181* 65 - 99 mg/dL Final  . Comment 1 01/01/2016 Document in Chart   Final  . Glucose-Capillary 01/01/2016 239* 65 - 99 mg/dL Final  . Comment 1 01/01/2016 Notify RN   Final  . Comment 2 01/01/2016 Document in Chart   Final  . Glucose-Capillary 01/02/2016 159* 65 - 99 mg/dL Final  . Comment 1 01/02/2016 Notify RN   Final  . Comment 2 01/02/2016 Document in Chart   Final  . Glucose-Capillary 01/02/2016 186* 65 - 99 mg/dL Final  Admission on 11/19/2015, Discharged on 11/21/2015  Component Date Value Ref Range Status  . Glucose-Capillary 11/19/2015 209* 65 - 99 mg/dL  Final  . Sodium 11/19/2015 127* 135 - 145 mmol/L Final  . Potassium 11/19/2015 4.2  3.5 - 5.1 mmol/L Final  . Chloride 11/19/2015 92* 101 - 111 mmol/L Final  . CO2 11/19/2015 21* 22 - 32 mmol/L Final  . Glucose, Bld 11/19/2015 215* 65 - 99 mg/dL Final  . BUN 11/19/2015 14  6 - 20 mg/dL Final  . Creatinine, Ser 11/19/2015 1.28* 0.61 - 1.24 mg/dL Final  . Calcium 11/19/2015 9.7  8.9 - 10.3 mg/dL Final  . Total Protein 11/19/2015 7.4  6.5 - 8.1 g/dL Final  . Albumin 11/19/2015 4.2  3.5 - 5.0 g/dL Final  . AST 11/19/2015 23  15 - 41 U/L Final  . ALT 11/19/2015 10* 17 - 63 U/L Final  . Alkaline Phosphatase 11/19/2015 73  38 - 126 U/L Final  . Total Bilirubin 11/19/2015 0.4  0.3 - 1.2 mg/dL Final  . GFR calc non Af Amer 11/19/2015 54* >60 mL/min Final  . GFR calc Af Amer 11/19/2015 >60  >60 mL/min Final   Comment: (NOTE) The eGFR has been calculated using the CKD EPI equation. This calculation has not been validated in all clinical situations. eGFR's persistently <60 mL/min signify possible Chronic Kidney Disease.   . Anion gap 11/19/2015 14  5 - 15 Final  . WBC 11/19/2015 11.8* 4.0 - 10.5 K/uL Final  . RBC 11/19/2015 4.63  4.22 - 5.81 MIL/uL Final  . Hemoglobin 11/19/2015 12.2* 13.0 - 17.0 g/dL Final  . HCT 11/19/2015 37.6* 39.0 - 52.0 % Final  . MCV 11/19/2015 81.2  78.0 - 100.0 fL Final  . MCH 11/19/2015 26.3  26.0 - 34.0 pg Final  . MCHC 11/19/2015 32.4  30.0 - 36.0 g/dL Final  . RDW 11/19/2015 14.5  11.5 - 15.5 % Final  . Platelets 11/19/2015 243  150 - 400 K/uL Final  . Glucose-Capillary 11/20/2015 116* 65 - 99 mg/dL Final  .  Alcohol, Ethyl (B) 11/19/2015 <5  <5 mg/dL Final   Comment:        LOWEST DETECTABLE LIMIT FOR SERUM ALCOHOL IS 5 mg/dL FOR MEDICAL PURPOSES ONLY   . Color, Urine 11/19/2015 YELLOW  YELLOW Final  . APPearance 11/19/2015 CLOUDY* CLEAR Final  . Specific Gravity, Urine 11/19/2015 1.017  1.005 - 1.030 Final  . pH 11/19/2015 7.0  5.0 - 8.0 Final  .  Glucose, UA 11/19/2015 500* NEGATIVE mg/dL Final  . Hgb urine dipstick 11/19/2015 TRACE* NEGATIVE Final  . Bilirubin Urine 11/19/2015 NEGATIVE  NEGATIVE Final  . Ketones, ur 11/19/2015 15* NEGATIVE mg/dL Final  . Protein, ur 11/19/2015 NEGATIVE  NEGATIVE mg/dL Final  . Nitrite 11/19/2015 NEGATIVE  NEGATIVE Final  . Leukocytes, UA 11/19/2015 LARGE* NEGATIVE Final  . Opiates 11/19/2015 NONE DETECTED  NONE DETECTED Final  . Cocaine 11/19/2015 NONE DETECTED  NONE DETECTED Final  . Benzodiazepines 11/19/2015 NONE DETECTED  NONE DETECTED Final  . Amphetamines 11/19/2015 NONE DETECTED  NONE DETECTED Final  . Tetrahydrocannabinol 11/19/2015 NONE DETECTED  NONE DETECTED Final  . Barbiturates 11/19/2015 NONE DETECTED  NONE DETECTED Final   Comment:        DRUG SCREEN FOR MEDICAL PURPOSES ONLY.  IF CONFIRMATION IS NEEDED FOR ANY PURPOSE, NOTIFY LAB WITHIN 5 DAYS.        LOWEST DETECTABLE LIMITS FOR URINE DRUG SCREEN Drug Class       Cutoff (ng/mL) Amphetamine      1000 Barbiturate      200 Benzodiazepine   858 Tricyclics       850 Opiates          300 Cocaine          300 THC              50   . Glucose-Capillary 11/20/2015 161* 65 - 99 mg/dL Final  . Squamous Epithelial / LPF 11/19/2015 0-5* NONE SEEN Final  . WBC, UA 11/19/2015 TOO NUMEROUS TO COUNT  0 - 5 WBC/hpf Final  . RBC / HPF 11/19/2015 0-5  0 - 5 RBC/hpf Final  . Bacteria, UA 11/19/2015 RARE* NONE SEEN Final  . Specimen Description 11/19/2015 URINE, RANDOM   Final  . Special Requests 11/19/2015 ADDED 0210 11/20/15   Final  . Culture 11/19/2015 >=100,000 COLONIES/mL VANCOMYCIN RESISTANT ENTEROCOCCUS   Final  . Report Status 11/19/2015 11/23/2015 FINAL   Final  . Organism ID, Bacteria 11/19/2015 VANCOMYCIN RESISTANT ENTEROCOCCUS   Final  . Troponin I 11/20/2015 0.05* <0.031 ng/mL Final   Comment:        PERSISTENTLY INCREASED TROPONIN VALUES IN THE RANGE OF 0.04-0.49 ng/mL CAN BE SEEN IN:       -UNSTABLE ANGINA        -CONGESTIVE HEART FAILURE       -MYOCARDITIS       -CHEST TRAUMA       -ARRYHTHMIAS       -LATE PRESENTING MYOCARDIAL INFARCTION       -COPD   CLINICAL FOLLOW-UP RECOMMENDED.   Marland Kitchen Troponin I 11/20/2015 0.04* <0.031 ng/mL Final   Comment:        PERSISTENTLY INCREASED TROPONIN VALUES IN THE RANGE OF 0.04-0.49 ng/mL CAN BE SEEN IN:       -UNSTABLE ANGINA       -CONGESTIVE HEART FAILURE       -MYOCARDITIS       -CHEST TRAUMA       -ARRYHTHMIAS       -LATE PRESENTING  MYOCARDIAL INFARCTION       -COPD   CLINICAL FOLLOW-UP RECOMMENDED.   Marland Kitchen Troponin I 11/20/2015 0.03  <0.031 ng/mL Final   Comment:        NO INDICATION OF MYOCARDIAL INJURY.   . Sodium 11/20/2015 137  135 - 145 mmol/L Final  . Potassium 11/20/2015 4.4  3.5 - 5.1 mmol/L Final  . Chloride 11/20/2015 100* 101 - 111 mmol/L Final  . CO2 11/20/2015 26  22 - 32 mmol/L Final  . Glucose, Bld 11/20/2015 189* 65 - 99 mg/dL Final  . BUN 11/20/2015 14  6 - 20 mg/dL Final  . Creatinine, Ser 11/20/2015 1.40* 0.61 - 1.24 mg/dL Final  . Calcium 11/20/2015 9.5  8.9 - 10.3 mg/dL Final  . GFR calc non Af Amer 11/20/2015 49* >60 mL/min Final  . GFR calc Af Amer 11/20/2015 56* >60 mL/min Final   Comment: (NOTE) The eGFR has been calculated using the CKD EPI equation. This calculation has not been validated in all clinical situations. eGFR's persistently <60 mL/min signify possible Chronic Kidney Disease.   . Anion gap 11/20/2015 11  5 - 15 Final  . Sodium 11/20/2015 133* 135 - 145 mmol/L Final  . Potassium 11/20/2015 4.2  3.5 - 5.1 mmol/L Final  . Chloride 11/20/2015 98* 101 - 111 mmol/L Final  . CO2 11/20/2015 24  22 - 32 mmol/L Final  . Glucose, Bld 11/20/2015 135* 65 - 99 mg/dL Final  . BUN 11/20/2015 13  6 - 20 mg/dL Final  . Creatinine, Ser 11/20/2015 1.20  0.61 - 1.24 mg/dL Final  . Calcium 11/20/2015 9.3  8.9 - 10.3 mg/dL Final  . GFR calc non Af Amer 11/20/2015 59* >60 mL/min Final  . GFR calc Af Amer 11/20/2015 >60  >60  mL/min Final   Comment: (NOTE) The eGFR has been calculated using the CKD EPI equation. This calculation has not been validated in all clinical situations. eGFR's persistently <60 mL/min signify possible Chronic Kidney Disease.   . Anion gap 11/20/2015 11  5 - 15 Final  . Vitamin B-12 11/20/2015 220  180 - 914 pg/mL Final   Comment: (NOTE) This assay is not validated for testing neonatal or myeloproliferative syndrome specimens for Vitamin B12 levels.   . Folate 11/20/2015 44.8  >5.9 ng/mL Final   RESULTS CONFIRMED BY MANUAL DILUTION  . Iron 11/20/2015 94  45 - 182 ug/dL Final  . TIBC 11/20/2015 417  250 - 450 ug/dL Final  . Saturation Ratios 11/20/2015 23  17.9 - 39.5 % Final  . UIBC 11/20/2015 323   Final  . Ferritin 11/20/2015 8* 24 - 336 ng/mL Final  . Osmolality, Ur 11/20/2015 106* 300 - 900 mOsm/kg Final  . Osmolality 11/20/2015 286  275 - 295 mOsm/kg Final  . Sodium, Ur 11/20/2015 28   Final  . Sodium 11/20/2015 139  135 - 145 mmol/L Final  . Potassium 11/20/2015 4.5  3.5 - 5.1 mmol/L Final  . Chloride 11/20/2015 100* 101 - 111 mmol/L Final  . CO2 11/20/2015 28  22 - 32 mmol/L Final  . Glucose, Bld 11/20/2015 143* 65 - 99 mg/dL Final  . BUN 11/20/2015 15  6 - 20 mg/dL Final  . Creatinine, Ser 11/20/2015 1.33* 0.61 - 1.24 mg/dL Final  . Calcium 11/20/2015 10.0  8.9 - 10.3 mg/dL Final  . GFR calc non Af Amer 11/20/2015 52* >60 mL/min Final  . GFR calc Af Amer 11/20/2015 >60  >60 mL/min Final   Comment: (NOTE)  The eGFR has been calculated using the CKD EPI equation. This calculation has not been validated in all clinical situations. eGFR's persistently <60 mL/min signify possible Chronic Kidney Disease.   . Anion gap 11/20/2015 11  5 - 15 Final  . WBC 11/21/2015 6.7  4.0 - 10.5 K/uL Final  . RBC 11/21/2015 4.45  4.22 - 5.81 MIL/uL Final  . Hemoglobin 11/21/2015 12.2* 13.0 - 17.0 g/dL Final  . HCT 11/21/2015 37.9* 39.0 - 52.0 % Final  . MCV 11/21/2015 85.2  78.0 - 100.0  fL Final  . MCH 11/21/2015 27.4  26.0 - 34.0 pg Final  . MCHC 11/21/2015 32.2  30.0 - 36.0 g/dL Final  . RDW 11/21/2015 15.3  11.5 - 15.5 % Final  . Platelets 11/21/2015 195  150 - 400 K/uL Final  . Sodium 11/21/2015 139  135 - 145 mmol/L Final  . Potassium 11/21/2015 4.3  3.5 - 5.1 mmol/L Final  . Chloride 11/21/2015 101  101 - 111 mmol/L Final  . CO2 11/21/2015 26  22 - 32 mmol/L Final  . Glucose, Bld 11/21/2015 150* 65 - 99 mg/dL Final  . BUN 11/21/2015 17  6 - 20 mg/dL Final  . Creatinine, Ser 11/21/2015 1.53* 0.61 - 1.24 mg/dL Final  . Calcium 11/21/2015 9.4  8.9 - 10.3 mg/dL Final  . GFR calc non Af Amer 11/21/2015 44* >60 mL/min Final  . GFR calc Af Amer 11/21/2015 51* >60 mL/min Final   Comment: (NOTE) The eGFR has been calculated using the CKD EPI equation. This calculation has not been validated in all clinical situations. eGFR's persistently <60 mL/min signify possible Chronic Kidney Disease.   . Anion gap 11/21/2015 12  5 - 15 Final  . Glucose-Capillary 11/20/2015 168* 65 - 99 mg/dL Final  . Glucose-Capillary 11/21/2015 155* 65 - 99 mg/dL Final  . Glucose-Capillary 11/21/2015 144* 65 - 99 mg/dL Final  . Glucose-Capillary 11/21/2015 96  65 - 99 mg/dL Final  Admission on 10/10/2015, Discharged on 10/11/2015  Component Date Value Ref Range Status  . Color, Urine 10/10/2015 YELLOW  YELLOW Final  . APPearance 10/10/2015 CLOUDY* CLEAR Final  . Specific Gravity, Urine 10/10/2015 1.014  1.005 - 1.030 Final  . pH 10/10/2015 5.5  5.0 - 8.0 Final  . Glucose, UA 10/10/2015 NEGATIVE  NEGATIVE mg/dL Final  . Hgb urine dipstick 10/10/2015 TRACE* NEGATIVE Final  . Bilirubin Urine 10/10/2015 NEGATIVE  NEGATIVE Final  . Ketones, ur 10/10/2015 NEGATIVE  NEGATIVE mg/dL Final  . Protein, ur 10/10/2015 NEGATIVE  NEGATIVE mg/dL Final  . Nitrite 10/10/2015 NEGATIVE  NEGATIVE Final  . Leukocytes, UA 10/10/2015 LARGE* NEGATIVE Final  . Squamous Epithelial / LPF 10/10/2015 0-5* NONE SEEN  Final  . WBC, UA 10/10/2015 TOO NUMEROUS TO COUNT  0 - 5 WBC/hpf Final  . RBC / HPF 10/10/2015 0-5  0 - 5 RBC/hpf Final  . Bacteria, UA 10/10/2015 FEW* NONE SEEN Final  . Urine-Other 10/10/2015 MUCOUS PRESENT   Final  . Specimen Description 10/10/2015 URINE, RANDOM   Final  . Special Requests 10/10/2015 NONE   Final  . Culture 10/10/2015    Final                   Value:>=100,000 COLONIES/mL ESCHERICHIA COLI Performed at [facility]   . Report Status 10/10/2015 10/14/2015 FINAL   Final  . Organism ID, Bacteria 10/10/2015 ESCHERICHIA COLI   Final  . WBC 10/10/2015 6.8  4.0 - 10.5 K/uL Final  . RBC  10/10/2015 4.39  4.22 - 5.81 MIL/uL Final  . Hemoglobin 10/10/2015 11.7* 13.0 - 17.0 g/dL Final  . HCT 10/10/2015 36.9* 39.0 - 52.0 % Final  . MCV 10/10/2015 84.1  78.0 - 100.0 fL Final  . MCH 10/10/2015 26.7  26.0 - 34.0 pg Final  . MCHC 10/10/2015 31.7  30.0 - 36.0 g/dL Final  . RDW 10/10/2015 16.9* 11.5 - 15.5 % Final  . Platelets 10/10/2015 218  150 - 400 K/uL Final  . Sodium 10/10/2015 140  135 - 145 mmol/L Final  . Potassium 10/10/2015 4.6  3.5 - 5.1 mmol/L Final  . Chloride 10/10/2015 107  101 - 111 mmol/L Final  . CO2 10/10/2015 23  22 - 32 mmol/L Final  . Glucose, Bld 10/10/2015 174* 65 - 99 mg/dL Final  . BUN 10/10/2015 15  6 - 20 mg/dL Final  . Creatinine, Ser 10/10/2015 1.38* 0.61 - 1.24 mg/dL Final  . Calcium 10/10/2015 9.4  8.9 - 10.3 mg/dL Final  . GFR calc non Af Amer 10/10/2015 50* >60 mL/min Final  . GFR calc Af Amer 10/10/2015 58* >60 mL/min Final   Comment: (NOTE) The eGFR has been calculated using the CKD EPI equation. This calculation has not been validated in all clinical situations. eGFR's persistently <60 mL/min signify possible Chronic Kidney Disease.   . Anion gap 10/10/2015 10  5 - 15 Final  . Opiates 10/10/2015 NONE DETECTED  NONE DETECTED Final  . Cocaine 10/10/2015 NONE DETECTED  NONE DETECTED Final  . Benzodiazepines 10/10/2015 NONE DETECTED   NONE DETECTED Final  . Amphetamines 10/10/2015 NONE DETECTED  NONE DETECTED Final  . Tetrahydrocannabinol 10/10/2015 NONE DETECTED  NONE DETECTED Final  . Barbiturates 10/10/2015 NONE DETECTED  NONE DETECTED Final   Comment:        DRUG SCREEN FOR MEDICAL PURPOSES ONLY.  IF CONFIRMATION IS NEEDED FOR ANY PURPOSE, NOTIFY LAB WITHIN 5 DAYS.        LOWEST DETECTABLE LIMITS FOR URINE DRUG SCREEN Drug Class       Cutoff (ng/mL) Amphetamine      1000 Barbiturate      200 Benzodiazepine   975 Tricyclics       883 Opiates          300 Cocaine          300 THC              50   . Alcohol, Ethyl (B) 10/10/2015 <5  <5 mg/dL Final   Comment:        LOWEST DETECTABLE LIMIT FOR SERUM ALCOHOL IS 5 mg/dL FOR MEDICAL PURPOSES ONLY     Dg Chest 1 View  12/30/2015  CLINICAL DATA:  Status post grand mal seizure. Found on floor. Initial encounter. EXAM: CHEST 1 VIEW COMPARISON:  Chest radiograph performed 11/19/2015 FINDINGS: The lungs are well-aerated. Mild bibasilar atelectasis is noted. Pulmonary vascularity is at the upper limits of normal. There is no evidence of pleural effusion or pneumothorax. The cardiomediastinal silhouette is mildly enlarged. No acute osseous abnormalities are seen. Degenerative change is noted at the left glenohumeral joint. Chronic left rib deformities are noted. IMPRESSION: Mild bibasilar atelectasis noted.  Mild cardiomegaly. Electronically Signed   By: Garald Balding M.D.   On: 12/30/2015 01:30   Ct Head Wo Contrast  12/30/2015  CLINICAL DATA:  Status post right mal seizure. Found on floor, with hematoma at the top of the head. Concern for cervical spine injury. Initial encounter. EXAM: CT HEAD WITHOUT  CONTRAST CT CERVICAL SPINE WITHOUT CONTRAST TECHNIQUE: Multidetector CT imaging of the head and cervical spine was performed following the standard protocol without intravenous contrast. Multiplanar CT image reconstructions of the cervical spine were also generated.  COMPARISON:  CT of the head performed 11/19/2015, CT of the cervical spine performed 09/02/2012, and MRI of the brain performed 05/10/2013 FINDINGS: CT HEAD FINDINGS There is no evidence of acute infarction, mass lesion, or intra- or extra-axial hemorrhage on CT. Prominence of the ventricles and sulci reflects mild cortical volume loss. Mild cerebellar atrophy is noted. Scattered periventricular and subcortical white matter change likely reflects small vessel ischemic microangiopathy. The brainstem and fourth ventricle are within normal limits. The basal ganglia are unremarkable in appearance. The cerebral hemispheres demonstrate grossly normal gray-white differentiation. No mass effect or midline shift is seen. There is no evidence of fracture; visualized osseous structures are unremarkable in appearance. The visualized portions of the orbits are within normal limits. Mucosal thickening is noted at the right maxillary sinus. The remaining paranasal sinuses and mastoid air cells are well-aerated. No significant soft tissue abnormalities are seen. CT CERVICAL SPINE FINDINGS There is no evidence of fracture or subluxation. Vertebral bodies demonstrate normal height and alignment. Intervertebral disc spaces are preserved. Prevertebral soft tissues are within normal limits. Scattered anterior and posterior disc osteophyte complexes noted along the cervical spine, with mild sclerotic change, and fusion at the posterior elements at C3-C4. The thyroid gland is unremarkable in appearance. The visualized lung apices are clear. No significant soft tissue abnormalities are seen. IMPRESSION: 1. No evidence of traumatic intracranial injury or fracture. 2. No evidence of fracture or subluxation along the cervical spine. 3. Mild cortical volume loss and scattered small vessel ischemic microangiopathy. 4. Mild degenerative change along the cervical spine. 5. Mucosal thickening at the right maxillary sinus. Electronically Signed    By: Garald Balding M.D.   On: 12/30/2015 01:12   Ct Cervical Spine Wo Contrast  12/30/2015  CLINICAL DATA:  Status post right mal seizure. Found on floor, with hematoma at the top of the head. Concern for cervical spine injury. Initial encounter. EXAM: CT HEAD WITHOUT CONTRAST CT CERVICAL SPINE WITHOUT CONTRAST TECHNIQUE: Multidetector CT imaging of the head and cervical spine was performed following the standard protocol without intravenous contrast. Multiplanar CT image reconstructions of the cervical spine were also generated. COMPARISON:  CT of the head performed 11/19/2015, CT of the cervical spine performed 09/02/2012, and MRI of the brain performed 05/10/2013 FINDINGS: CT HEAD FINDINGS There is no evidence of acute infarction, mass lesion, or intra- or extra-axial hemorrhage on CT. Prominence of the ventricles and sulci reflects mild cortical volume loss. Mild cerebellar atrophy is noted. Scattered periventricular and subcortical white matter change likely reflects small vessel ischemic microangiopathy. The brainstem and fourth ventricle are within normal limits. The basal ganglia are unremarkable in appearance. The cerebral hemispheres demonstrate grossly normal gray-white differentiation. No mass effect or midline shift is seen. There is no evidence of fracture; visualized osseous structures are unremarkable in appearance. The visualized portions of the orbits are within normal limits. Mucosal thickening is noted at the right maxillary sinus. The remaining paranasal sinuses and mastoid air cells are well-aerated. No significant soft tissue abnormalities are seen. CT CERVICAL SPINE FINDINGS There is no evidence of fracture or subluxation. Vertebral bodies demonstrate normal height and alignment. Intervertebral disc spaces are preserved. Prevertebral soft tissues are within normal limits. Scattered anterior and posterior disc osteophyte complexes noted along the cervical spine, with mild  sclerotic change,  and fusion at the posterior elements at C3-C4. The thyroid gland is unremarkable in appearance. The visualized lung apices are clear. No significant soft tissue abnormalities are seen. IMPRESSION: 1. No evidence of traumatic intracranial injury or fracture. 2. No evidence of fracture or subluxation along the cervical spine. 3. Mild cortical volume loss and scattered small vessel ischemic microangiopathy. 4. Mild degenerative change along the cervical spine. 5. Mucosal thickening at the right maxillary sinus. Electronically Signed   By: Garald Balding M.D.   On: 12/30/2015 01:12     Assessment/Plan    ICD-9-CM ICD-10-CM   1. Psychosis, unspecified psychosis type 298.9 F29   2. Iron deficiency anemia 280.9 D50.9   3. Seizure ([place]) 780.39 R56.9   4. Anxiety state 300.00 F41.1   5. Alcohol abuse 305.00 F10.10   6. Controlled type 2 diabetes mellitus with stage 3 chronic kidney disease, without long-term current use of insulin ([facility]) 250.40 E11.22    585.3 N18.3   7. Essential hypertension 401.9 I10   8. Chews tobacco regularly 305.1 Z72.0   9. CKD (chronic kidney disease) stage 3, GFR 30-59 ml/min 585.3 N18.3     D/C ativan. Start Ativan 0.59m TID for agitation/anxiety - hold for sedation  Start seroquel 554m Po BID for psychosis  Cont other meds as ordered. May need to change metformin to different med if renal fxn cont to decline  Seizure precautions  Fall precautions  Check CBC w diff, cmp and depakote level in the AM  Refer to psych for further mx  PT/OT/ST as ordered  GOAL: short term rehab with potential for long term care. Communicated with pt and nursing.  Will follow  Lucianna Ostlund S. CaPerlie [facility]Pi[facility] [address] [place] [phone] (Monday-Friday 8 AM - 5 PM) ([phone] 5 PM and follow prompts

## 2016-01-04 LAB — BASIC METABOLIC PANEL
BUN: 29 mg/dL — AB (ref 4–21)
Creatinine: 1.8 mg/dL — AB (ref 0.6–1.3)
GLUCOSE: 201 mg/dL
Potassium: 4.5 mmol/L (ref 3.4–5.3)
SODIUM: 132 mmol/L — AB (ref 137–147)

## 2016-01-04 LAB — HEPATIC FUNCTION PANEL
ALT: 10 U/L (ref 10–40)
AST: 12 U/L — AB (ref 14–40)
Alkaline Phosphatase: 54 U/L (ref 25–125)
BILIRUBIN, TOTAL: 0.5 mg/dL

## 2016-01-04 LAB — CBC AND DIFFERENTIAL
HEMATOCRIT: 35 % — AB (ref 41–53)
Hemoglobin: 11.1 g/dL — AB (ref 13.5–17.5)
Platelets: 248 10*3/uL (ref 150–399)
WBC: 9.2 10^3/mL

## 2016-01-07 ENCOUNTER — Ambulatory Visit: Payer: Medicaid Other | Admitting: Neurology

## 2016-01-14 ENCOUNTER — Ambulatory Visit: Payer: Medicare Other | Admitting: Neurology

## 2016-01-14 ENCOUNTER — Ambulatory Visit (INDEPENDENT_AMBULATORY_CARE_PROVIDER_SITE_OTHER): Payer: Medicare Other | Admitting: Neurology

## 2016-01-14 ENCOUNTER — Encounter: Payer: Self-pay | Admitting: Neurology

## 2016-01-14 VITALS — BP 107/70 | HR 78 | Ht 67.0 in | Wt 195.0 lb

## 2016-01-14 DIAGNOSIS — R569 Unspecified convulsions: Secondary | ICD-10-CM

## 2016-01-14 DIAGNOSIS — Z5181 Encounter for therapeutic drug level monitoring: Secondary | ICD-10-CM | POA: Diagnosis not present

## 2016-01-14 DIAGNOSIS — F1010 Alcohol abuse, uncomplicated: Secondary | ICD-10-CM

## 2016-01-14 DIAGNOSIS — R269 Unspecified abnormalities of gait and mobility: Secondary | ICD-10-CM

## 2016-01-14 HISTORY — DX: Unspecified abnormalities of gait and mobility: R26.9

## 2016-01-14 NOTE — Progress Notes (Signed)
Reason for visit: Seizures  Referring physician: [facility]  William [name] is a 70 y.o. male  History of present illness:  [name] is a 70 year old white male with a history of alcohol abuse, and significant hearing deficits. The patient has been residing in an extended care facility, he has apparently had bottles of liquor found in his room. The patient presented to the hospital on 12/29/2015 with a prolonged 10 minute seizure event. This was described as generalized tonic-clonic episode. In 2015, the patient had an EEG study with frequent sharp wave activity from the left frontotemporal area. The patient was not on any seizure medications prior to this recent admission. He was started on Depakote during that admission. The patient may have agitation following the seizures. He has had a significant gait disorder, he walks with a walker, the duration of this gait problem is not clear. The patient comes to the office today with a caretaker who is unfamiliar with the patient. The patient wears adult diapers, he may be incontinent of the bowels and bladder. There is some report of dementia in the records from the hospital. The patient underwent a CT scan of the brain and cervical spine while in the hospital. This showed some small vessel ischemic changes, no acute changes were seen. The patient is currently on Depakene taking 500 mg 3 times daily. He comes to this office for an evaluation.  Past Medical History  Diagnosis Date  . Diabetes mellitus type II   . Hyperlipemia   . Depression with anxiety   . HTN (hypertension)   . Cataract   . Alcohol withdrawal seizure ([place]) 2013  . Diabetes mellitus without complication ([place])   . Hypertension   . Hearing impairment   . Cataract   . HTN (hypertension), benign 02/26/2014  . Type II or unspecified type diabetes mellitus with neurological manifestations, not stated as uncontrolled 02/26/2014  . Chronic renal insufficiency, stage III  (moderate) 02/26/2014  . Alcohol abuse 2009    dependency as exhibited by ETOH withdrawal seizure.   . Anemia 2011    Normocytic.   . Seizures ([place])   . Abnormality of gait 01/14/2016    Past Surgical History  Procedure Laterality Date  . No past surgeries      Family History  Problem Relation Age of Onset  . Family history unknown: Yes    Social history:  reports that he has quit smoking. His smoking use included Cigars. His smokeless tobacco use includes Chew. He reports that he drinks alcohol. He reports that he does not use illicit drugs.  Medications:  Prior to Admission medications   Medication Sig Start Date End Date Taking? Authorizing Provider  aspirin (ASPIRIN EC) 81 MG EC tablet Take 81 mg by mouth daily. Swallow whole.    Historical Provider, MD  ferrous sulfate 325 (65 FE) MG tablet Take 1 tablet (325 mg total) by mouth daily with breakfast. 01/02/16   Liberty Handy, MD  folic acid (FOLVITE) 1 MG tablet Take 1 tablet (1 mg total) by mouth daily. 05/07/15   Liberty Handy, MD  glipiZIDE (GLUCOTROL) 10 MG tablet Take 10 mg by mouth 2 (two) times daily before a meal.    Historical Provider, MD  lisinopril (PRINIVIL,ZESTRIL) 5 MG tablet Take 5 mg by mouth daily.    Historical Provider, MD  LORazepam (ATIVAN) 0.5 MG tablet Take 0.5 mg by mouth 2 (two) times daily as needed for anxiety.    Historical Provider, MD  metFORMIN (GLUCOPHAGE) 1000 MG tablet Take 1,000 mg by mouth 2 (two) times daily with a meal.    Historical Provider, MD  nicotine (NICODERM CQ - DOSED IN MG/24 HOURS) 21 mg/24hr patch Place 21 mg onto the skin daily.    Historical Provider, MD  pravastatin (PRAVACHOL) 10 MG tablet Take 10 mg by mouth daily.    Historical Provider, MD  Saccharomyces boulardii (FLORASTOR PO) Take 1 tablet by mouth 2 (two) times daily.    Historical Provider, MD  thiamine 100 MG tablet Take 1 tablet (100 mg total) by mouth daily. 05/07/15   Liberty Handy, MD  valproic acid (DEPAKENE) 250 MG/5ML  syrup Take 10 mLs (500 mg total) by mouth 3 (three) times daily. 01/02/16   Liberty Handy, MD     No Known Allergies  ROS:  Out of a complete 14 system review of symptoms, the patient complains only of the following symptoms, and all other reviewed systems are negative.  Seizures Hard of hearing Low back pain Gait disturbance  Blood pressure 107/70, pulse 78, height 5\' 7"  (1.702 m), weight 195 lb (88.451 kg).  Physical Exam  General: The patient is alert and cooperative at the time of the examination. The patient is moderately obese, very hard of hearing.  Eyes: Pupils are equal, round, and reactive to light. Discs are flat bilaterally.  Neck: The neck is supple, no carotid bruits are noted.  Respiratory: The respiratory examination is clear.  Cardiovascular: The cardiovascular examination reveals a regular rate and rhythm, no obvious murmurs or rubs are noted.  Skin: Extremities are without significant edema.  Neurologic Exam  Mental status: The patient is alert and cooperative. The patient very hard of hearing, it is difficult to communicate with the patient.  Cranial nerves: Facial symmetry is present. There is good sensation of the face to pinprick and soft touch bilaterally. The strength of the facial muscles and the muscles to head turning and shoulder shrug are normal bilaterally. Speech is well enunciated, no aphasia or dysarthria is noted. Extraocular movements are full. Visual fields are full. The tongue is midline, and the patient has symmetric elevation of the soft palate. No obvious hearing deficits are noted.  Motor: The motor testing reveals 5 over 5 strength of all 4 extremities. The patient has incomplete abduction of the left arm. Good symmetric motor tone is noted throughout.  Sensory: Sensory testing is intact to pinprick, soft touch, and vibration sensation on all 4 extremities, with exception of a stocking pattern pinprick sensory deficit in the distal third of  the legs below the knees. No evidence of extinction is noted.  Coordination: Cerebellar testing reveals good finger-nose-finger and heel-to-shin bilaterally. The patient may have some apraxia with the use of extremities, but the severe hearing deficits make communication with the patient very difficult  Gait and station: The patient requires assistance for standing. Once up, he has very wide based stance, he can take a few steps with assistance only.  Reflexes: Deep tendon reflexes are symmetric, but are depressed bilaterally. Toes are equivocal bilaterally.    EEG 02/26/14:  EEG Abnormalities: 1) Frequent left frontotemporal sharp wave discharges  2) generalized irregular delta activity.   Clinical Interpretation: This EEG is suggestive of a potential area of epileptogenicity in the left frontotemporal region in the setting of a generalized non-specific cerebral dysfunction(encephalopathy). There was no seizure recorded on this study.    CT head and cervical 12/30/15:  IMPRESSION: 1. No evidence of traumatic intracranial injury or  fracture. 2. No evidence of fracture or subluxation along the cervical spine. 3. Mild cortical volume loss and scattered small vessel ischemic microangiopathy. 4. Mild degenerative change along the cervical spine. 5. Mucosal thickening at the right maxillary sinus.   * CT scan images were reviewed online. I agree with the written report.   Assessment/Plan:  1. History seizures with recent recurrence  2. Gait disturbance, duration unclear  3. Hard of hearing  4. Possible memory disturbance  The patient has had a recent seizure event. He currently is on Depakote. He has a history of prior alcohol abuse. The patient will be sent for blood work today. A recent vitamin B12 level was in the low normal range, we will check a methylmalonic acid level. The patient will remain on his current dose of Depakote, he will follow-up in 4 months. The patient does not  operate a motor vehicle.  Jill Alexanders MD 01/14/2016 8:32 PM  [facility] [address] [place], [place] [address]  Phone [phone] Fax [phone]

## 2016-01-16 ENCOUNTER — Telehealth: Payer: Self-pay | Admitting: Neurology

## 2016-01-16 LAB — AMMONIA: Ammonia: 42 ug/dL (ref 27–102)

## 2016-01-16 LAB — CBC WITH DIFFERENTIAL/PLATELET
BASOS: 1 %
Basophils Absolute: 0 10*3/uL (ref 0.0–0.2)
EOS (ABSOLUTE): 0.2 10*3/uL (ref 0.0–0.4)
EOS: 3 %
HEMATOCRIT: 37 % — AB (ref 37.5–51.0)
Hemoglobin: 11.4 g/dL — ABNORMAL LOW (ref 12.6–17.7)
IMMATURE GRANULOCYTES: 3 %
Immature Grans (Abs): 0.2 10*3/uL — ABNORMAL HIGH (ref 0.0–0.1)
LYMPHS ABS: 1.6 10*3/uL (ref 0.7–3.1)
Lymphs: 28 %
MCH: 27.1 pg (ref 26.6–33.0)
MCHC: 30.8 g/dL — AB (ref 31.5–35.7)
MCV: 88 fL (ref 79–97)
MONOS ABS: 0.5 10*3/uL (ref 0.1–0.9)
Monocytes: 9 %
NEUTROS ABS: 3.1 10*3/uL (ref 1.4–7.0)
NEUTROS PCT: 56 %
PLATELETS: 171 10*3/uL (ref 150–379)
RBC: 4.2 x10E6/uL (ref 4.14–5.80)
RDW: 16.3 % — AB (ref 12.3–15.4)
WBC: 5.6 10*3/uL (ref 3.4–10.8)

## 2016-01-16 LAB — COMPREHENSIVE METABOLIC PANEL
A/G RATIO: 1.7 (ref 1.2–2.2)
ALT: 15 IU/L (ref 0–44)
AST: 12 IU/L (ref 0–40)
Albumin: 4 g/dL (ref 3.5–4.8)
Alkaline Phosphatase: 60 IU/L (ref 39–117)
BILIRUBIN TOTAL: 0.2 mg/dL (ref 0.0–1.2)
BUN/Creatinine Ratio: 14 (ref 10–24)
BUN: 20 mg/dL (ref 8–27)
CHLORIDE: 100 mmol/L (ref 96–106)
CO2: 25 mmol/L (ref 18–29)
Calcium: 9.8 mg/dL (ref 8.6–10.2)
Creatinine, Ser: 1.41 mg/dL — ABNORMAL HIGH (ref 0.76–1.27)
GFR, EST AFRICAN AMERICAN: 57 mL/min/{1.73_m2} — AB (ref >59)
GFR, EST NON AFRICAN AMERICAN: 49 mL/min/{1.73_m2} — AB (ref >59)
GLOBULIN, TOTAL: 2.3 g/dL (ref 1.5–4.5)
Glucose: 196 mg/dL — ABNORMAL HIGH (ref 65–99)
POTASSIUM: 5.6 mmol/L — AB (ref 3.5–5.2)
SODIUM: 138 mmol/L (ref 134–144)
TOTAL PROTEIN: 6.3 g/dL (ref 6.0–8.5)

## 2016-01-16 LAB — RPR: RPR: NONREACTIVE

## 2016-01-16 LAB — VALPROIC ACID LEVEL: Valproic Acid Lvl: 62 ug/mL (ref 50–100)

## 2016-01-16 LAB — METHYLMALONIC ACID, SERUM: Methylmalonic Acid: 310 nmol/L (ref 0–378)

## 2016-01-16 NOTE — Telephone Encounter (Signed)
I tried to call the results of the blood work, no answer. The blood work shows a chronic mild anemia, chronic renal insufficiency, Depakote level is in the low therapeutic range, calcium is slightly elevated. Ammonia level is normal. No change in dosing of the Depakote. I will try to call tomorrow.

## 2016-01-17 NOTE — Telephone Encounter (Signed)
I called again, no answer, I do not appear to have a telephone number with which to reach the patient. The blood work however shows abnormalities that are chronic and stable, no change in Depakote dosing.

## 2016-01-18 DIAGNOSIS — E119 Type 2 diabetes mellitus without complications: Secondary | ICD-10-CM | POA: Diagnosis not present

## 2016-01-18 LAB — HEMOGLOBIN A1C: Hemoglobin A1C: 6.7

## 2016-01-30 ENCOUNTER — Encounter: Payer: Self-pay | Admitting: Adult Health

## 2016-01-30 ENCOUNTER — Non-Acute Institutional Stay (SKILLED_NURSING_FACILITY): Payer: Medicare Other | Admitting: Adult Health

## 2016-01-30 DIAGNOSIS — E1122 Type 2 diabetes mellitus with diabetic chronic kidney disease: Secondary | ICD-10-CM

## 2016-01-30 DIAGNOSIS — R569 Unspecified convulsions: Secondary | ICD-10-CM

## 2016-01-30 DIAGNOSIS — N183 Chronic kidney disease, stage 3 unspecified: Secondary | ICD-10-CM

## 2016-01-30 DIAGNOSIS — N189 Chronic kidney disease, unspecified: Secondary | ICD-10-CM | POA: Diagnosis not present

## 2016-01-30 DIAGNOSIS — I10 Essential (primary) hypertension: Secondary | ICD-10-CM

## 2016-01-30 DIAGNOSIS — F29 Unspecified psychosis not due to a substance or known physiological condition: Secondary | ICD-10-CM

## 2016-01-30 NOTE — Assessment & Plan Note (Signed)
Elevated today but other number at goal. Would continue current dose of meds and continue to monitor BP.  K level at 5.6 during his neuro visit, will recheck BMP and adjust ace if indicated.

## 2016-01-30 NOTE — Assessment & Plan Note (Signed)
Off metformin, A1C acceptable.  Continue Glipizide, if CBGs increase consider tradjenta.  Resident not compliant with diet.

## 2016-01-30 NOTE — Assessment & Plan Note (Signed)
Continue to monitor. Avoid nephrotoxic agents.

## 2016-01-30 NOTE — Assessment & Plan Note (Signed)
No new events. Depakote level checked at neuro visit and recommended to continue same dose.

## 2016-01-30 NOTE — Progress Notes (Signed)
Patient ID: William [name], male   DOB: 1945/10/24, 70 y.o.   MRN: [id number]  Location:  [place] Room Number: [place]:  SNF [phone]) Provider:   Cindi Carbon, [place] [phone]   Gildardo Cranker, DO  Patient Care Team: Gildardo Cranker, DO as PCP - General (Internal Medicine)  Extended Emergency Contact Information Primary Emergency Contact: Collier Flowers, [place] Phone: [phone] Relation: Friend Secondary Emergency Contact: [place] [address] [place] Phone: [phone] Relation: Significant other  Code Status:  Full code Goals of care: Advanced Directive information Advanced Directives 01/30/2016  Does patient have an advance directive? No  Would patient like information on creating an advanced directive? No - patient declined information     Chief Complaint  Patient presents with  . Medical Management of Chronic Issues    Routine Visit    HPI:  Pt is a 70 y.o. male seen today for medical management of chronic diseases.  [facility] after a hospitalization with prolonged seizure activity due to possible alcohol withdrawal.  He has a gait disturbance and requires the use of a walker and needs assistance with executive functions.  Denies pain today. Continues to chew tobacco and remains non compliant with diet. 1. Type 2 diabetes mellitus with stage 3 chronic kidney disease, without long-term current use of insulin (HCC) Off metformin due to CKD CBG range 142-218  2. Essential hypertension Controlled most of the time, elevated slightly today but has some agitation  3. Chronic renal insufficiency, stage III (moderate) Lab Results  Component Value Date   BUN 20 01/14/2016   Lab Results  Component Value Date   CREATININE 1.41* 01/14/2016  improved after metformin discontinued   4. Psychosis,  unspecified psychosis type -staff reports agitation and anger out burst -psych following on seroquel  5. Seizure ([place]) -no seizure activity -followed by neuro on depakote    Past Medical History  Diagnosis Date  . Diabetes mellitus type II   . Hyperlipemia   . Depression with anxiety   . HTN (hypertension)   . Cataract   . Alcohol withdrawal seizure ([place]) 2013  . Diabetes mellitus without complication ([place])   . Hypertension   . Hearing impairment   . Cataract   . HTN (hypertension), benign 02/26/2014  . Type II or unspecified type diabetes mellitus with neurological manifestations, not stated as uncontrolled 02/26/2014  . Chronic renal insufficiency, stage III (moderate) 02/26/2014  . Alcohol abuse 2009    dependency as exhibited by ETOH withdrawal seizure.   . Anemia 2011    Normocytic.   . Seizures ([place])   . Abnormality of gait 01/14/2016   Past Surgical History  Procedure Laterality Date  . No past surgeries      No Known Allergies    Medication List       This list is accurate as of: 01/30/16  1:34 PM.  Always use your most recent med list.               acetaminophen 500 MG tablet  Commonly known as:  TYLENOL  Take 2 tablets by mouth every morning and every evening for pain.     aspirin EC 81 MG EC tablet  Generic drug:  aspirin  Take 81 mg by mouth daily. Swallow whole.  ferrous sulfate 325 (65 FE) MG tablet  Take 1 tablet (325 mg total) by mouth daily with breakfast.     folic acid 1 MG tablet  Commonly known as:  FOLVITE  Take 1 tablet (1 mg total) by mouth daily.     glipiZIDE 10 MG tablet  Commonly known as:  GLUCOTROL  Take 10 mg by mouth 2 (two) times daily before a meal.     lisinopril 5 MG tablet  Commonly known as:  PRINIVIL,ZESTRIL  Take 5 mg by mouth daily.     LORazepam 0.5 MG tablet  Commonly known as:  ATIVAN  Take 0.25 mg by mouth 3 (three) times daily as needed for anxiety.     pravastatin 10 MG tablet  Commonly known as:   PRAVACHOL  Take 10 mg by mouth daily.     QUEtiapine 50 MG tablet  Commonly known as:  SEROQUEL  Take 50 mg by mouth 2 (two) times daily.     saccharomyces boulardii 250 MG capsule  Commonly known as:  FLORASTOR  Take 250 mg by mouth daily.     valproic acid 250 MG/5ML syrup  Commonly known as:  DEPAKENE  Take 10 mLs (500 mg total) by mouth 3 (three) times daily.        Review of Systems  Constitutional: Negative for fever, chills, diaphoresis, activity change, appetite change, fatigue and unexpected weight change.  HENT: Negative for congestion.   Respiratory: Negative for cough and shortness of breath.   Cardiovascular: Positive for leg swelling. Negative for chest pain and palpitations.  Gastrointestinal: Negative for diarrhea, constipation and abdominal distention.  Genitourinary: Negative for dysuria, flank pain and difficulty urinating.  Musculoskeletal: Positive for gait problem. Negative for back pain and arthralgias.  Neurological: Negative for dizziness, seizures and facial asymmetry.  Psychiatric/Behavioral: Positive for behavioral problems and agitation. Negative for confusion.    Immunization History  Administered Date(s) Administered  . Influenza Split 06/07/2012  . Influenza,inj,Quad PF,36+ Mos 12/16/2013  . Pneumococcal Polysaccharide-23 05/17/2009, 05/30/2012, 12/16/2013  . Td 05/17/2009  . Tdap 11/29/2011   Pertinent  Health Maintenance Due  Topic Date Due  . COLONOSCOPY  10/18/1993  . FOOT EXAM  05/23/2014  . LIPID PANEL  05/23/2014  . OPHTHALMOLOGY EXAM  01/07/2015  . PNA vac Low Risk Adult (2 of 2 - PCV13) 02/26/2017 (Originally 12/17/2014)  . HEMOGLOBIN A1C  04/24/2016  . INFLUENZA VACCINE  04/29/2016   Fall Risk  01/06/2014 05/23/2013 02/10/2013 01/31/2013 02/19/2012  Falls in the past year? No No No No -  Risk for fall due to : - Impaired balance/gait Impaired balance/gait - History of fall(s);Impaired balance/gait  Risk for fall due to (comments):  - - use a walker - -   Functional Status Survey:    Filed Vitals:   01/30/16 1319  BP: 153/90  Pulse: 66  Temp: 98.7 F (37.1 C)  TempSrc: Oral  Resp: 18  Height: 5\' 7"  (1.702 m)  Weight: 208 lb 9.6 oz (94.62 kg)   Body mass index is 32.66 kg/(m^2). Physical Exam  Constitutional: He is oriented to person, place, and time. No distress.  HENT:  Head: Normocephalic and atraumatic.  Nose: Nose normal.  Mouth/Throat: Oropharynx is clear and moist. No oropharyngeal exudate.  Edentulous. Changes noted to gums for prolonged tobacco chewing but no lesions identified. HOH  Eyes: Conjunctivae and EOM are normal. Pupils are equal, round, and reactive to light. Right eye exhibits no discharge. Left eye exhibits no discharge.  Neck: Normal range of motion. Neck supple.  Cardiovascular: Normal rate and regular rhythm.   No murmur heard. BLE edema +1  Pulmonary/Chest: Effort normal and breath sounds normal.  Abdominal: Soft. Bowel sounds are normal.  Neurological: He is alert and oriented to person, place, and time.  Skin: Skin is warm and dry. He is not diaphoretic.  Psychiatric: He has a normal mood and affect.    Labs reviewed:  Recent Labs  11/21/15 0811 12/29/15 2240 12/30/15 1252 12/30/15 1655 01/04/16 01/14/16 1002  NA 139 135  --   --  132* 138  K 4.3 4.2  --   --  4.5 5.6*  CL 101 102  --   --   --  100  CO2 26 20*  --   --   --  25  GLUCOSE 150* 209*  --   --   --  196*  BUN 17 10  --   --  29* 20  CREATININE 1.53* 1.55*  --  1.55* 1.8* 1.41*  CALCIUM 9.4 8.6*  --   --   --  9.8  MG  --   --  1.7  --   --   --     Recent Labs  11/19/15 1945 12/30/15 1252 01/04/16 01/14/16 1002  AST 23 22 12* 12  ALT 10* 11* 10 15  ALKPHOS 73 57 54 60  BILITOT 0.4 0.7  --  0.2  PROT 7.4 6.4*  --  6.3  ALBUMIN 4.2 3.5  --  4.0    Recent Labs  02/16/15 0301 02/20/15 2022  12/29/15 2240 12/30/15 1655 01/04/16 01/14/16 1002  WBC 9.3 8.3  < > 7.0 7.4 9.2 5.6  NEUTROABS  7.8* 6.3  --   --   --   --  3.1  HGB 12.7* 11.9*  < > 9.1* 9.9* 11.1*  --   HCT 40.0 37.5*  < > 28.7* 31.6* 35* 37.0*  MCV 87.3 87.0  < > 85.2 86.3  --  88  PLT 217 228  < > 194 215 248 171  < > = values in this interval not displayed. Lab Results  Component Value Date   TSH 3.661 05/10/2013   Lab Results  Component Value Date   HGBA1C 6.7 01/18/2016   Lab Results  Component Value Date   CHOL 137 05/23/2013   HDL 61 05/23/2013   LDLCALC 56 05/23/2013   TRIG 99 05/23/2013   CHOLHDL 2.2 05/23/2013    Significant Diagnostic Results in last 30 days:  No results found.  Assessment/Plan  Diabetes mellitus, type 2 Off metformin, A1C acceptable.  Continue Glipizide, if CBGs increase consider tradjenta.  Resident not compliant with diet.  Essential hypertension Elevated today but other number at goal. Would continue current dose of meds and continue to monitor BP.  K level at 5.6 during his neuro visit, will recheck BMP and adjust ace if indicated.   Chronic renal insufficiency, stage III (moderate) Continue to monitor. Avoid nephrotoxic agents.   Psychosis Followed by Psych. Seroquel adjusted upward due to behaviors.   Seizure ([place]) No new events. Depakote level checked at neuro visit and recommended to continue same dose.    Family/ staff Communication: discussed with resident and staff  Labs/tests ordered:  BMP, lipids   Cindi Carbon, [place] [phone]

## 2016-01-30 NOTE — Assessment & Plan Note (Signed)
Followed by Psych. Seroquel adjusted upward due to behaviors.

## 2016-02-01 LAB — LIPID PANEL
CHOLESTEROL: 145 mg/dL (ref 0–200)
HDL: 29 mg/dL — AB (ref 35–70)
LDL Cholesterol: 65 mg/dL
Triglycerides: 253 mg/dL — AB (ref 40–160)

## 2016-02-01 LAB — BASIC METABOLIC PANEL
BUN: 20 mg/dL (ref 4–21)
Creatinine: 1.4 mg/dL — AB (ref 0.6–1.3)
Glucose: 155 mg/dL
Potassium: 4.4 mmol/L (ref 3.4–5.3)
Sodium: 138 mmol/L (ref 137–147)

## 2016-02-21 DIAGNOSIS — F29 Unspecified psychosis not due to a substance or known physiological condition: Secondary | ICD-10-CM | POA: Diagnosis not present

## 2016-02-21 DIAGNOSIS — F419 Anxiety disorder, unspecified: Secondary | ICD-10-CM | POA: Diagnosis not present

## 2016-02-21 DIAGNOSIS — F39 Unspecified mood [affective] disorder: Secondary | ICD-10-CM | POA: Diagnosis not present

## 2016-02-21 DIAGNOSIS — F329 Major depressive disorder, single episode, unspecified: Secondary | ICD-10-CM | POA: Diagnosis not present

## 2016-03-12 ENCOUNTER — Non-Acute Institutional Stay (SKILLED_NURSING_FACILITY): Payer: Medicare Other | Admitting: Nurse Practitioner

## 2016-03-12 DIAGNOSIS — E1122 Type 2 diabetes mellitus with diabetic chronic kidney disease: Secondary | ICD-10-CM

## 2016-03-12 DIAGNOSIS — I10 Essential (primary) hypertension: Secondary | ICD-10-CM | POA: Diagnosis not present

## 2016-03-12 DIAGNOSIS — R569 Unspecified convulsions: Secondary | ICD-10-CM | POA: Diagnosis not present

## 2016-03-12 DIAGNOSIS — E785 Hyperlipidemia, unspecified: Secondary | ICD-10-CM | POA: Diagnosis not present

## 2016-03-12 DIAGNOSIS — N189 Chronic kidney disease, unspecified: Secondary | ICD-10-CM

## 2016-03-12 DIAGNOSIS — N183 Chronic kidney disease, stage 3 unspecified: Secondary | ICD-10-CM

## 2016-03-12 DIAGNOSIS — F411 Generalized anxiety disorder: Secondary | ICD-10-CM

## 2016-03-12 NOTE — Progress Notes (Signed)
Nursing Home Location:  [facility]   Place of Service: SNF (31)  PCP: Gildardo Cranker, DO  No Known Allergies  Chief Complaint  Patient presents with  . Medical Management of Chronic Issues    Routine Visit    HPI:  Patient is a 70 y.o. male seen today at [facility] for routine follow up on chronic conditions. Pt with hx of ETOH abuse, DM, HTN, seizures, anemia, depression.  Pt currently a long term resident after hospital stay for seizures after ETOH withdrawal. No further seizures noted. Pt poor historian but has no complaints today. Staff without acute concerns.   Review of Systems: by pt and staff Review of Systems  Constitutional: Negative for fever, chills, diaphoresis, activity change, appetite change, fatigue and unexpected weight change.  HENT: Negative for congestion.   Respiratory: Negative for cough and shortness of breath.   Cardiovascular: Negative for chest pain, palpitations and leg swelling.  Gastrointestinal: Negative for diarrhea, constipation and abdominal distention.  Genitourinary: Negative for dysuria, flank pain and difficulty urinating.  Musculoskeletal: Positive for gait problem. Negative for back pain and arthralgias.  Neurological: Negative for dizziness, seizures and facial asymmetry.  Psychiatric/Behavioral: Positive for behavioral problems and agitation. Negative for confusion.    Past Medical History  Diagnosis Date  . Diabetes mellitus type II   . Hyperlipemia   . Depression with anxiety   . HTN (hypertension)   . Cataract   . Alcohol withdrawal seizure ([place]) 2013  . Diabetes mellitus without complication ([place])   . Hypertension   . Hearing impairment   . Cataract   . HTN (hypertension), benign 02/26/2014  . Type II or unspecified type diabetes mellitus with neurological manifestations, not stated as uncontrolled 02/26/2014  . Chronic renal insufficiency, stage III (moderate) 02/26/2014  . Alcohol abuse  2009    dependency as exhibited by ETOH withdrawal seizure.   . Anemia 2011    Normocytic.   . Seizures ([place])   . Abnormality of gait 01/14/2016   Past Surgical History  Procedure Laterality Date  . No past surgeries     Social History:   reports that he has quit smoking. His smoking use included Cigars. His smokeless tobacco use includes Chew. He reports that he drinks alcohol. He reports that he does not use illicit drugs.  Family History  Problem Relation Age of Onset  . Family history unknown: Yes    Medications: Patient's Medications  New Prescriptions   No medications on file  Previous Medications   ACETAMINOPHEN (TYLENOL) 500 MG TABLET    Take 2 tablets by mouth every morning and every evening for pain.   ASPIRIN (ASPIRIN EC) 81 MG EC TABLET    Take 81 mg by mouth daily. Swallow whole.   FERROUS SULFATE 325 (65 FE) MG TABLET    Take 1 tablet (325 mg total) by mouth daily with breakfast.   FOLIC ACID (FOLVITE) 1 MG TABLET    Take 1 tablet (1 mg total) by mouth daily.   GLIPIZIDE (GLUCOTROL) 10 MG TABLET    Take 10 mg by mouth 2 (two) times daily before a meal.   LISINOPRIL (PRINIVIL,ZESTRIL) 5 MG TABLET    Take 5 mg by mouth daily.   LORAZEPAM (ATIVAN) 0.5 MG TABLET    Take 0.25 mg by mouth 3 (three) times daily as needed for anxiety.   PRAVASTATIN (PRAVACHOL) 10 MG TABLET    Take 10 mg by mouth at bedtime.  QUETIAPINE (SEROQUEL) 50 MG TABLET    Take 50 mg by mouth 2 (two) times daily.   SACCHAROMYCES BOULARDII (FLORASTOR) 250 MG CAPSULE    Take 250 mg by mouth 2 (two) times daily.    SERTRALINE (ZOLOFT) 50 MG TABLET    Take 50 mg by mouth daily.   THIAMINE (VITAMIN B-1) 100 MG TABLET    Take 100 mg by mouth daily.   VALPROIC ACID (DEPAKENE) 250 MG/5ML SYRUP    Take 10 mLs (500 mg total) by mouth 3 (three) times daily.  Modified Medications   No medications on file  Discontinued Medications   No medications on file     Physical Exam: Filed Vitals:   03/12/16 1428    BP: 130/78  Pulse: 71  Temp: 98 F (36.7 C)  TempSrc: Oral  Resp: 20  Height: 5\' 7"  (1.702 m)  Weight: 206 lb (93.441 kg)    Physical Exam  Constitutional: He is oriented to person, place, and time. No distress.  HENT:  Head: Normocephalic and atraumatic.  Nose: Nose normal.  Mouth/Throat: Oropharynx is clear and moist. No oropharyngeal exudate.  Edentulous. Very HOH  Eyes: Conjunctivae and EOM are normal. Pupils are equal, round, and reactive to light. Right eye exhibits no discharge. Left eye exhibits no discharge.  Neck: Normal range of motion. Neck supple.  Cardiovascular: Normal rate and regular rhythm.   No murmur heard. BLE edema +1  Pulmonary/Chest: Effort normal and breath sounds normal.  Abdominal: Soft. Bowel sounds are normal.  Neurological: He is alert and oriented to person, place, and time.  Skin: Skin is warm and dry. He is not diaphoretic.  Psychiatric: He has a normal mood and affect.    Labs reviewed: Basic Metabolic Panel:  Recent Labs  11/21/15 0811 12/29/15 2240 12/30/15 1252  01/04/16 01/14/16 1002 02/01/16  NA 139 135  --   --  132* 138 138  K 4.3 4.2  --   --  4.5 5.6* 4.4  CL 101 102  --   --   --  100  --   CO2 26 20*  --   --   --  25  --   GLUCOSE 150* 209*  --   --   --  196*  --   BUN 17 10  --   --  29* 20 20  CREATININE 1.53* 1.55*  --   < > 1.8* 1.41* 1.4*  CALCIUM 9.4 8.6*  --   --   --  9.8  --   MG  --   --  1.7  --   --   --   --   < > = values in this interval not displayed. Liver Function Tests:  Recent Labs  11/19/15 1945 12/30/15 1252 01/04/16 01/14/16 1002  AST 23 22 12* 12  ALT 10* 11* 10 15  ALKPHOS 73 57 54 60  BILITOT 0.4 0.7  --  0.2  PROT 7.4 6.4*  --  6.3  ALBUMIN 4.2 3.5  --  4.0    Recent Labs  05/04/15 0245  LIPASE 18*    Recent Labs  01/14/16 1002  AMMONIA 42   CBC:  Recent Labs  12/29/15 2240 12/30/15 1655 01/04/16 01/14/16 1002  WBC 7.0 7.4 9.2 5.6  NEUTROABS  --   --   --  3.1   HGB 9.1* 9.9* 11.1*  --   HCT 28.7* 31.6* 35* 37.0*  MCV 85.2 86.3  --  88  PLT 194 215 248 171   TSH: No results for input(s): TSH in the last 8760 hours. A1C: Lab Results  Component Value Date   HGBA1C 6.7 01/18/2016   Lipid Panel:  Recent Labs  02/01/16  CHOL 145  HDL 29*  LDLCALC 65  TRIG 253*     Assessment/Plan 1. Type 2 diabetes mellitus with stage 3 chronic kidney disease, without long-term current use of insulin (HCC) Pt non complaint with diet. Suspect most of the "fasting" blood sugars are not fasting. Blood sugars ranging from 120-200s. Will cont on glipizide, due for A1c next month  2. Essential hypertension Blood pressure stable, cont on current regimen  3. Seizure ([place]) No further seizures noted.   4. Anxiety state Stable, followed by psych services with recent start on zoloft   5. Chronic renal insufficiency, stage III (moderate) -BUN/Cr stable. To avoid NSAIDS and dehydration  6. Hyperlipidemia LDL at goal, conts pravastatin     Marland Reine K. Harle Battiest  [facility] [phone] 8 am - 5 pm) [phone] (after hours)

## 2016-03-17 DIAGNOSIS — E785 Hyperlipidemia, unspecified: Secondary | ICD-10-CM | POA: Insufficient documentation

## 2016-04-16 ENCOUNTER — Non-Acute Institutional Stay (SKILLED_NURSING_FACILITY): Payer: Medicare Other | Admitting: Nurse Practitioner

## 2016-04-16 DIAGNOSIS — N183 Chronic kidney disease, stage 3 unspecified: Secondary | ICD-10-CM

## 2016-04-16 DIAGNOSIS — D509 Iron deficiency anemia, unspecified: Secondary | ICD-10-CM | POA: Diagnosis not present

## 2016-04-16 DIAGNOSIS — I10 Essential (primary) hypertension: Secondary | ICD-10-CM | POA: Diagnosis not present

## 2016-04-16 DIAGNOSIS — E1122 Type 2 diabetes mellitus with diabetic chronic kidney disease: Secondary | ICD-10-CM

## 2016-04-16 DIAGNOSIS — R300 Dysuria: Secondary | ICD-10-CM

## 2016-04-16 DIAGNOSIS — R569 Unspecified convulsions: Secondary | ICD-10-CM

## 2016-04-16 NOTE — Progress Notes (Signed)
Nursing Home Location:  [facility]  Place of Service: SNF (31)  PCP: Gildardo Cranker, DO  No Known Allergies  Chief Complaint  Patient presents with  . Medical Management of Chronic Issues    Routine Visit    HPI:  Patient is a 70 y.o. male seen today at [facility] for routine follow up on chronic conditions. Pt with hx of ETOH abuse, DM, HTN, seizures, anemia, depression.  Pt currently a long term resident after hospital stay for seizures after ETOH withdrawal. No further seizures noted.  Pt reporting increase urination with pain at times. Room smells of urine and brief is saturated. He can not find his urinal.  Pt non-complaint with diet, eating a lot of sweets. Reports good mood however staff notes increase agitation at times.  Staff without acute concerns.    Review of Systems: by pt and staff Review of Systems  Constitutional: Negative for fever, chills, diaphoresis, activity change, appetite change, fatigue and unexpected weight change.  HENT: Negative for congestion.   Respiratory: Negative for cough and shortness of breath.   Cardiovascular: Negative for chest pain, palpitations and leg swelling.  Gastrointestinal: Negative for diarrhea, constipation and abdominal distention.  Genitourinary: Negative for dysuria, flank pain and difficulty urinating.  Musculoskeletal: Positive for gait problem. Negative for back pain and arthralgias.  Neurological: Negative for dizziness, seizures and facial asymmetry.  Psychiatric/Behavioral: Positive for behavioral problems and agitation. Negative for confusion.    Past Medical History  Diagnosis Date  . Diabetes mellitus type II   . Hyperlipemia   . Depression with anxiety   . HTN (hypertension)   . Cataract   . Alcohol withdrawal seizure ([place]) 2013  . Diabetes mellitus without complication ([place])   . Hypertension   . Hearing impairment   . Cataract   . HTN (hypertension), benign 02/26/2014  . Type II or  unspecified type diabetes mellitus with neurological manifestations, not stated as uncontrolled 02/26/2014  . Chronic renal insufficiency, stage III (moderate) 02/26/2014  . Alcohol abuse 2009    dependency as exhibited by ETOH withdrawal seizure.   . Anemia 2011    Normocytic.   . Seizures ([place])   . Abnormality of gait 01/14/2016   Past Surgical History  Procedure Laterality Date  . No past surgeries     Social History:   reports that he has quit smoking. His smoking use included Cigars. His smokeless tobacco use includes Chew. He reports that he drinks alcohol. He reports that he does not use illicit drugs.  Family History  Problem Relation Age of Onset  . Family history unknown: Yes    Medications: Patient's Medications  New Prescriptions   No medications on file  Previous Medications   ACETAMINOPHEN (TYLENOL) 500 MG TABLET    Take 2 tablets by mouth every morning and every evening for pain.   ASPIRIN (ASPIRIN EC) 81 MG EC TABLET    Take 81 mg by mouth daily. Swallow whole.   FERROUS SULFATE 325 (65 FE) MG TABLET    Take 1 tablet (325 mg total) by mouth daily with breakfast.   FOLIC ACID (FOLVITE) 1 MG TABLET    Take 1 tablet (1 mg total) by mouth daily.   GLIPIZIDE (GLUCOTROL) 10 MG TABLET    Take 10 mg by mouth 2 (two) times daily before a meal.   LISINOPRIL (PRINIVIL,ZESTRIL) 5 MG TABLET    Take 5 mg by mouth daily.   LORAZEPAM (ATIVAN) 0.5 MG TABLET  Take 0.25 mg by mouth 3 (three) times daily as needed for anxiety.   PRAVASTATIN (PRAVACHOL) 10 MG TABLET    Take 10 mg by mouth at bedtime.    QUETIAPINE (SEROQUEL) 100 MG TABLET    Take 100 mg by mouth at bedtime.   SERTRALINE (ZOLOFT) 50 MG TABLET    Take 50 mg by mouth daily.   THIAMINE (VITAMIN B-1) 100 MG TABLET    Take 100 mg by mouth daily.   VALPROIC ACID (DEPAKENE) 250 MG/5ML SYRUP    Take 10 mLs (500 mg total) by mouth 3 (three) times daily.  Modified Medications   No medications on file  Discontinued Medications     QUETIAPINE (SEROQUEL) 50 MG TABLET    Take 50 mg by mouth 2 (two) times daily.   SACCHAROMYCES BOULARDII (FLORASTOR) 250 MG CAPSULE    Take 250 mg by mouth 2 (two) times daily.      Physical Exam: Filed Vitals:   04/16/16 1314  BP: 128/75  Pulse: 78  Temp: 97.1 F (36.2 C)  TempSrc: Oral  Resp: 20  Height: 5\' 7"  (1.702 m)  Weight: 213 lb (96.616 kg)    Physical Exam  Constitutional: He is oriented to person, place, and time. No distress.  HENT:  Head: Normocephalic and atraumatic.  Nose: Nose normal.  Mouth/Throat: Oropharynx is clear and moist. No oropharyngeal exudate.  Edentulous. Very HOH  Eyes: Conjunctivae and EOM are normal. Pupils are equal, round, and reactive to light. Right eye exhibits no discharge. Left eye exhibits no discharge.  Neck: Normal range of motion. Neck supple.  Cardiovascular: Normal rate and regular rhythm.   No murmur heard. BLE edema +1  Pulmonary/Chest: Effort normal and breath sounds normal.  Abdominal: Soft. Bowel sounds are normal.  Neurological: He is alert and oriented to person, place, and time.  Skin: Skin is warm and dry. He is not diaphoretic.  Psychiatric: He has a normal mood and affect.    Labs reviewed: Basic Metabolic Panel:  Recent Labs  11/21/15 0811 12/29/15 2240 12/30/15 1252  01/04/16 01/14/16 1002 02/01/16  NA 139 135  --   --  132* 138 138  K 4.3 4.2  --   --  4.5 5.6* 4.4  CL 101 102  --   --   --  100  --   CO2 26 20*  --   --   --  25  --   GLUCOSE 150* 209*  --   --   --  196*  --   BUN 17 10  --   --  29* 20 20  CREATININE 1.53* 1.55*  --   < > 1.8* 1.41* 1.4*  CALCIUM 9.4 8.6*  --   --   --  9.8  --   MG  --   --  1.7  --   --   --   --   < > = values in this interval not displayed. Liver Function Tests:  Recent Labs  11/19/15 1945 12/30/15 1252 01/04/16 01/14/16 1002  AST 23 22 12* 12  ALT 10* 11* 10 15  ALKPHOS 73 57 54 60  BILITOT 0.4 0.7  --  0.2  PROT 7.4 6.4*  --  6.3  ALBUMIN 4.2 3.5   --  4.0    Recent Labs  05/04/15 0245  LIPASE 18*    Recent Labs  01/14/16 1002  AMMONIA 42   CBC:  Recent Labs  12/29/15 2240 12/30/15  1655 01/04/16 01/14/16 1002  WBC 7.0 7.4 9.2 5.6  NEUTROABS  --   --   --  3.1  HGB 9.1* 9.9* 11.1*  --   HCT 28.7* 31.6* 35* 37.0*  MCV 85.2 86.3  --  88  PLT 194 215 248 171   TSH: No results for input(s): TSH in the last 8760 hours. A1C: Lab Results  Component Value Date   HGBA1C 6.7 01/18/2016   Lipid Panel:  Recent Labs  02/01/16  CHOL 145  HDL 29*  LDLCALC 65  TRIG 253*     Assessment/Plan 1. Type 2 diabetes mellitus with stage 3 chronic kidney disease, without long-term current use of insulin (HCC) A1c at goal in April, conts on glipizide, will order diabetic eye and foot exam  2. Iron deficiency anemia conts on ferrous sulfate, will follow up CBC next month  3. Essential hypertension -blood pressure stable on lisinopril  4. Seizure ([place]) Without recent seizures, conts on depakote   5. Dysuria Reports dysuria, hx of UTI, will get UA C&S       Maleiyah Releford K. Harle Battiest  [facility] [phone] 8 am - 5 pm) [phone] (after hours)

## 2016-04-17 DIAGNOSIS — Z87440 Personal history of urinary (tract) infections: Secondary | ICD-10-CM | POA: Diagnosis not present

## 2016-04-21 ENCOUNTER — Encounter: Payer: Self-pay | Admitting: Gastroenterology

## 2016-04-24 DIAGNOSIS — M79671 Pain in right foot: Secondary | ICD-10-CM | POA: Diagnosis not present

## 2016-05-07 ENCOUNTER — Non-Acute Institutional Stay (SKILLED_NURSING_FACILITY): Payer: Medicare Other | Admitting: Nurse Practitioner

## 2016-05-07 DIAGNOSIS — N183 Chronic kidney disease, stage 3 unspecified: Secondary | ICD-10-CM

## 2016-05-07 DIAGNOSIS — F1010 Alcohol abuse, uncomplicated: Secondary | ICD-10-CM

## 2016-05-07 DIAGNOSIS — I10 Essential (primary) hypertension: Secondary | ICD-10-CM | POA: Diagnosis not present

## 2016-05-07 DIAGNOSIS — F1011 Alcohol abuse, in remission: Secondary | ICD-10-CM

## 2016-05-07 DIAGNOSIS — N189 Chronic kidney disease, unspecified: Secondary | ICD-10-CM | POA: Diagnosis not present

## 2016-05-07 DIAGNOSIS — F29 Unspecified psychosis not due to a substance or known physiological condition: Secondary | ICD-10-CM | POA: Diagnosis not present

## 2016-05-07 DIAGNOSIS — D509 Iron deficiency anemia, unspecified: Secondary | ICD-10-CM | POA: Diagnosis not present

## 2016-05-07 DIAGNOSIS — E1122 Type 2 diabetes mellitus with diabetic chronic kidney disease: Secondary | ICD-10-CM

## 2016-05-07 DIAGNOSIS — E785 Hyperlipidemia, unspecified: Secondary | ICD-10-CM

## 2016-05-07 NOTE — Progress Notes (Signed)
Nursing Home Location:  [facility]  Place of Service: SNF (31)  PCP: [name], MD  No Known Allergies  Chief Complaint  Patient presents with  . Medical Management of Chronic Issues    Routine Visit    HPI:  Patient is a 70 y.o. male seen today at [facility] for routine follow up on chronic conditions. Pt with hx of ETOH abuse, DM, HTN, seizures, anemia, depression. Pt has been doing well in the last month without acute issues  UA was obtained last month due to frequency and dysuria, however specimen was not a clean catch or I&O cath and staff has not been able to obtain a proper sample since. Pt is currently asymptomatic, without fever or increased confusion- will DC order for redraw at this time.  Staff notes increase blood sugars, pt snacks most the day on unhealthy food and candy.  Blood pressure has also been elevated.  Review of Systems: by pt and staff Review of Systems  Constitutional: Negative for activity change, appetite change, chills, diaphoresis, fatigue, fever and unexpected weight change.  HENT: Negative for congestion.   Respiratory: Negative for cough and shortness of breath.   Cardiovascular: Negative for chest pain, palpitations and leg swelling.  Gastrointestinal: Negative for abdominal distention, constipation and diarrhea.  Genitourinary: Negative for difficulty urinating, dysuria and flank pain.  Musculoskeletal: Positive for gait problem. Negative for arthralgias and back pain.  Neurological: Negative for dizziness, seizures and facial asymmetry.  Psychiatric/Behavioral: Positive for agitation and behavioral problems. Negative for confusion.    Past Medical History:  Diagnosis Date  . Abnormality of gait 01/14/2016  . Alcohol abuse 2009   dependency as exhibited by ETOH withdrawal seizure.   . Alcohol withdrawal seizure ([place]) 2013  . Anemia 2011   Normocytic.   . Cataract   . Cataract   . Chronic renal insufficiency, stage  III (moderate) 02/26/2014  . Depression with anxiety   . Diabetes mellitus type II   . Diabetes mellitus without complication ([place])   . Hearing impairment   . HTN (hypertension)   . HTN (hypertension), benign 02/26/2014  . Hyperlipemia   . Hypertension   . Seizures ([place])   . Type II or unspecified type diabetes mellitus with neurological manifestations, not stated as uncontrolled 02/26/2014   Past Surgical History:  Procedure Laterality Date  . NO PAST SURGERIES     Social History:   reports that he has quit smoking. His smoking use included Cigars. His smokeless tobacco use includes Chew. He reports that he drinks alcohol. He reports that he does not use drugs.  Family History  Problem Relation Age of Onset  . Family history unknown: Yes    Medications: Patient's Medications  New Prescriptions   No medications on file  Previous Medications   ACETAMINOPHEN (TYLENOL) 500 MG TABLET    Take 2 tablets by mouth every morning and every evening for pain.   ASPIRIN (ASPIRIN EC) 81 MG EC TABLET    Take 81 mg by mouth daily. Swallow whole.   FERROUS SULFATE 325 (65 FE) MG TABLET    Take 1 tablet (325 mg total) by mouth daily with breakfast.   FOLIC ACID (FOLVITE) 1 MG TABLET    Take 1 tablet (1 mg total) by mouth daily.   GLIPIZIDE (GLUCOTROL) 10 MG TABLET    Take 10 mg by mouth 2 (two) times daily before a meal.   LISINOPRIL (PRINIVIL,ZESTRIL) 5 MG TABLET    Take  5 mg by mouth daily.   LORAZEPAM (ATIVAN) 0.5 MG TABLET    Take 0.25 mg by mouth 3 (three) times daily as needed for anxiety.   PRAVASTATIN (PRAVACHOL) 10 MG TABLET    Take 10 mg by mouth at bedtime.    QUETIAPINE (SEROQUEL) 100 MG TABLET    Take 100 mg by mouth at bedtime.   QUETIAPINE (SEROQUEL) 50 MG TABLET    Take 50 mg by mouth every morning.   SERTRALINE (ZOLOFT) 50 MG TABLET    Take 50 mg by mouth daily.   THIAMINE (VITAMIN B-1) 100 MG TABLET    Take 100 mg by mouth daily.   VALPROIC ACID (DEPAKENE) 250 MG/5ML SYRUP     Take 10 mLs (500 mg total) by mouth 3 (three) times daily.  Modified Medications   No medications on file  Discontinued Medications   No medications on file     Physical Exam: Vitals:   05/07/16 1349  BP: (!) 172/78  Pulse: 78  Resp: 20  Temp: 98 F (36.7 C)  TempSrc: Oral  Weight: 213 lb 3.2 oz (96.7 kg)  Height: 5\' 7"  (1.702 m)    Physical Exam  Constitutional: No distress.  HENT:  Head: Normocephalic and atraumatic.  Nose: Nose normal.  Mouth/Throat: Oropharynx is clear and moist. No oropharyngeal exudate.  Edentulous. Very HOH  Eyes: Conjunctivae and EOM are normal. Pupils are equal, round, and reactive to light. Right eye exhibits no discharge. Left eye exhibits no discharge.  Neck: Normal range of motion. Neck supple.  Cardiovascular: Normal rate and regular rhythm.   No murmur heard. BLE edema +1  Pulmonary/Chest: Effort normal and breath sounds normal.  Abdominal: Soft. Bowel sounds are normal.  Musculoskeletal: He exhibits no edema.  Neurological: He is alert.  Skin: Skin is warm and dry. He is not diaphoretic.  Psychiatric: He has a normal mood and affect.    Labs reviewed: Basic Metabolic Panel:  Recent Labs  11/21/15 0811 12/29/15 2240 12/30/15 1252  01/04/16 01/14/16 1002 02/01/16  NA 139 135  --   --  132* 138 138  K 4.3 4.2  --   --  4.5 5.6* 4.4  CL 101 102  --   --   --  100  --   CO2 26 20*  --   --   --  25  --   GLUCOSE 150* 209*  --   --   --  196*  --   BUN 17 10  --   --  29* 20 20  CREATININE 1.53* 1.55*  --   < > 1.8* 1.41* 1.4*  CALCIUM 9.4 8.6*  --   --   --  9.8  --   MG  --   --  1.7  --   --   --   --   < > = values in this interval not displayed. Liver Function Tests:  Recent Labs  11/19/15 1945 12/30/15 1252 01/04/16 01/14/16 1002  AST 23 22 12* 12  ALT 10* 11* 10 15  ALKPHOS 73 57 54 60  BILITOT 0.4 0.7  --  0.2  PROT 7.4 6.4*  --  6.3  ALBUMIN 4.2 3.5  --  4.0   No results for input(s): LIPASE, AMYLASE in the  last 8760 hours.  Recent Labs  01/14/16 1002  AMMONIA 42   CBC:  Recent Labs  12/29/15 2240 12/30/15 1655 01/04/16 01/14/16 1002  WBC 7.0 7.4 9.2 5.6  NEUTROABS  --   --   --  3.1  HGB 9.1* 9.9* 11.1*  --   HCT 28.7* 31.6* 35* 37.0*  MCV 85.2 86.3  --  88  PLT 194 215 248 171   TSH: No results for input(s): TSH in the last 8760 hours. A1C: Lab Results  Component Value Date   HGBA1C 6.7 01/18/2016   Lipid Panel:  Recent Labs  02/01/16  CHOL 145  HDL 29*  LDLCALC 65  TRIG 253*     Assessment/Plan 1. Essential hypertension Blood pressure elevated, will start novasc 5 mg -cont lisinopril 2.5 mg -blood pressures daily  2. Type 2 diabetes mellitus with stage 3 chronic kidney disease, without long-term current use of insulin (HCC) Blood sugars elevated consistently, conts on glipizide 10 mg BID, will start tradjenta 5 mg daily and monitor -will get A1c at this time   3. Chronic renal insufficiency, stage III (moderate) -will follow up BMP   4. History of alcohol abuse conts on folic acid and thiamine   5. Iron deficiency anemia On iron, will follow up CBC  6. Hyperlipidemia Will follow up fasting lipids   7. Psychosis, unspecified psychosis type Staff reports occasional mood swings and behaviors. Following with psych services at this time. Will cont current regimen   Jessica K. Harle Battiest  [facility] [phone] 8 am - 5 pm) [phone] (after hours)

## 2016-05-08 DIAGNOSIS — E785 Hyperlipidemia, unspecified: Secondary | ICD-10-CM | POA: Diagnosis not present

## 2016-05-08 DIAGNOSIS — I129 Hypertensive chronic kidney disease with stage 1 through stage 4 chronic kidney disease, or unspecified chronic kidney disease: Secondary | ICD-10-CM | POA: Diagnosis not present

## 2016-05-08 DIAGNOSIS — E119 Type 2 diabetes mellitus without complications: Secondary | ICD-10-CM | POA: Diagnosis not present

## 2016-05-08 DIAGNOSIS — D509 Iron deficiency anemia, unspecified: Secondary | ICD-10-CM | POA: Diagnosis not present

## 2016-05-09 LAB — LIPID PANEL
CHOLESTEROL: 159 mg/dL (ref 0–200)
HDL: 33 mg/dL — AB (ref 35–70)
LDL Cholesterol: 72 mg/dL
TRIGLYCERIDES: 269 mg/dL — AB (ref 40–160)

## 2016-05-09 LAB — BASIC METABOLIC PANEL
BUN: 22 mg/dL — AB (ref 4–21)
Creatinine: 1.4 mg/dL — AB (ref 0.6–1.3)
Glucose: 236 mg/dL
POTASSIUM: 4.6 mmol/L (ref 3.4–5.3)
SODIUM: 134 mmol/L — AB (ref 137–147)

## 2016-05-09 LAB — CBC AND DIFFERENTIAL
HCT: 38 % — AB (ref 41–53)
HEMOGLOBIN: 12.6 g/dL — AB (ref 13.5–17.5)
Platelets: 154 10*3/uL (ref 150–399)
WBC: 5 10^3/mL

## 2016-05-09 LAB — HEMOGLOBIN A1C: Hemoglobin A1C: 7.8

## 2016-05-15 ENCOUNTER — Ambulatory Visit (INDEPENDENT_AMBULATORY_CARE_PROVIDER_SITE_OTHER): Payer: Medicare Other | Admitting: Adult Health

## 2016-05-15 ENCOUNTER — Encounter: Payer: Self-pay | Admitting: Adult Health

## 2016-05-15 VITALS — BP 106/72 | HR 94 | Ht 67.0 in

## 2016-05-15 DIAGNOSIS — R569 Unspecified convulsions: Secondary | ICD-10-CM | POA: Diagnosis not present

## 2016-05-15 NOTE — Progress Notes (Signed)
PATIENT: William [name] DOB: April 07, 1946  REASON FOR VISIT: follow up- seizures  HISTORY FROM: patient  HISTORY OF PRESENT ILLNESS: William [name] is a 70 year old male with a history of alcohol abuse and seizures. He returns today for follow-up. He is currently on Depakote 500 mg 3 times a day. The patient resides in an extended care facility. Unfortunately he does not have any family present today. He was brought to this visit by transporter who is not familiar with his medical history. The patient appears to be a poor historian. He denies any seizure activity. He does require assistance with ADLs. The patient had blood work in April that was unremarkable. Denies any new neurological conditions. He returns today for an evaluation.  HISTORY 01/14/16: William [name] is a 70 year old white male with a history of alcohol abuse, and significant hearing deficits. The patient has been residing in an extended care facility, he has apparently had bottles of liquor found in his room. The patient presented to the hospital on 12/29/2015 with a prolonged 10 minute seizure event. This was described as generalized tonic-clonic episode. In 2015, the patient had an EEG study with frequent sharp wave activity from the left frontotemporal area. The patient was not on any seizure medications prior to this recent admission. He was started on Depakote during that admission. The patient may have agitation following the seizures. He has had a significant gait disorder, he walks with a walker, the duration of this gait problem is not clear. The patient comes to the office today with a caretaker who is unfamiliar with the patient. The patient wears adult diapers, he may be incontinent of the bowels and bladder. There is some report of dementia in the records from the hospital. The patient underwent a CT scan of the brain and cervical spine while in the hospital. This showed some small vessel ischemic changes, no acute changes  were seen. The patient is currently on Depakene taking 500 mg 3 times daily. He comes to this office for an evaluation.  REVIEW OF SYSTEMS: Out of a complete 14 system review of symptoms, the patient complains only of the following symptoms, and all other reviewed systems are negative.  See history of present illness  ALLERGIES: No Known Allergies  HOME MEDICATIONS: Outpatient Medications Prior to Visit  Medication Sig Dispense Refill  . acetaminophen (TYLENOL) 500 MG tablet Take 2 tablets by mouth every morning and every evening for pain.    Marland Kitchen aspirin (ASPIRIN EC) 81 MG EC tablet Take 81 mg by mouth daily. Swallow whole.    . ferrous sulfate 325 (65 FE) MG tablet Take 1 tablet (325 mg total) by mouth daily with breakfast. 60 tablet 3  . folic acid (FOLVITE) 1 MG tablet Take 1 tablet (1 mg total) by mouth daily. 30 tablet 1  . glipiZIDE (GLUCOTROL) 10 MG tablet Take 10 mg by mouth 2 (two) times daily before a meal.    . lisinopril (PRINIVIL,ZESTRIL) 5 MG tablet Take 5 mg by mouth daily.    Marland Kitchen LORazepam (ATIVAN) 0.5 MG tablet Take 0.25 mg by mouth 3 (three) times daily as needed for anxiety.    . pravastatin (PRAVACHOL) 10 MG tablet Take 10 mg by mouth at bedtime.     Marland Kitchen QUEtiapine (SEROQUEL) 100 MG tablet Take 100 mg by mouth at bedtime.    Marland Kitchen QUEtiapine (SEROQUEL) 50 MG tablet Take 50 mg by mouth every morning.    . sertraline (ZOLOFT) 50 MG tablet Take 50  mg by mouth daily.    Marland Kitchen thiamine (VITAMIN B-1) 100 MG tablet Take 100 mg by mouth daily.    Marland Kitchen valproic acid (DEPAKENE) 250 MG/5ML syrup Take 10 mLs (500 mg total) by mouth 3 (three) times daily. 240 mL 12   No facility-administered medications prior to visit.     PAST MEDICAL HISTORY: Past Medical History:  Diagnosis Date  . Abnormality of gait 01/14/2016  . Alcohol abuse 2009   dependency as exhibited by ETOH withdrawal seizure.   . Alcohol withdrawal seizure ([place]) 2013  . Anemia 2011   Normocytic.   . Cataract   . Cataract     . Chronic renal insufficiency, stage III (moderate) 02/26/2014  . Depression with anxiety   . Diabetes mellitus type II   . Diabetes mellitus without complication ([place])   . Hearing impairment   . HTN (hypertension)   . HTN (hypertension), benign 02/26/2014  . Hyperlipemia   . Hypertension   . Seizures ([place])   . Type II or unspecified type diabetes mellitus with neurological manifestations, not stated as uncontrolled 02/26/2014    PAST SURGICAL HISTORY: Past Surgical History:  Procedure Laterality Date  . NO PAST SURGERIES      FAMILY HISTORY: Family History  Problem Relation Age of Onset  . Family history unknown: Yes    SOCIAL HISTORY: Social History   Social History  . Marital status: Single    Spouse name: N/A  . Number of children: N/A  . Years of education: N/A   Occupational History  . Not on file.   Social History Main Topics  . Smoking status: Former Smoker    Types: Cigars  . Smokeless tobacco: Current User    Types: Chew  . Alcohol use 0.0 oz/week     Comment: 04/2015 I have not drank in over a month "  . Drug use: No  . Sexual activity: Not on file   Other Topics Concern  . Not on file   Social History Narrative   ** Merged History Encounter **[facility]             PHYSICAL EXAM  Vitals:   05/15/16 0858  BP: 106/72  Pulse: 94  Height: 5\' 7"  (1.702 m)   There is no height or weight on file to calculate BMI.  Generalized: Well developed, in no acute distress   Neurological examination  Mentation: Alert oriented to time, place, history taking. Follows all commands speech and language fluent Cranial nerve II-XII: Pupils were equal round reactive to light. Extraocular movements were full, visual field were full on confrontational test. Facial sensation and strength were normal. Uvula tongue midline. Head turning and shoulder shrug  were normal and symmetric. Motor: The motor testing reveals 5 over 5 strength of all 4 extremities.  Good symmetric motor tone is noted throughout.  Sensory: Sensory testing is intact to soft touch on all 4 extremities. No evidence of extinction is noted.  Coordination: Cerebellar testing reveals good finger-nose-finger and heel-to-shin bilaterally.  Gait and station: Patient is in a wheelchair.  Reflexes: Deep tendon reflexes are symmetric and normal bilaterally.   DIAGNOSTIC DATA (LABS, IMAGING, TESTING) - I reviewed patient records, labs, notes, testing and imaging myself where available.  Lab Results  Component Value Date   WBC 5.6 01/14/2016   HGB 11.1 (A) 01/04/2016   HCT 37.0 (L) 01/14/2016   MCV 88 01/14/2016   PLT 171 01/14/2016      Component Value Date/Time  NA 138 02/01/2016   K 4.4 02/01/2016   CL 100 01/14/2016 1002   CO2 25 01/14/2016 1002   GLUCOSE 196 (H) 01/14/2016 1002   GLUCOSE 209 (H) 12/29/2015 2240   BUN 20 02/01/2016   CREATININE 1.4 (A) 02/01/2016   CREATININE 1.41 (H) 01/14/2016 1002   CREATININE 1.08 01/11/2014 1531   CALCIUM 9.8 01/14/2016 1002   PROT 6.3 01/14/2016 1002   ALBUMIN 4.0 01/14/2016 1002   AST 12 01/14/2016 1002   ALT 15 01/14/2016 1002   ALKPHOS 60 01/14/2016 1002   BILITOT 0.2 01/14/2016 1002   GFRNONAA 49 (L) 01/14/2016 1002   GFRNONAA 69 01/11/2014 1531   GFRAA 57 (L) 01/14/2016 1002   GFRAA 80 01/11/2014 1531   Lab Results  Component Value Date   CHOL 145 02/01/2016   HDL 29 (A) 02/01/2016   LDLCALC 65 02/01/2016   TRIG 253 (A) 02/01/2016   CHOLHDL 2.2 05/23/2013   Lab Results  Component Value Date   HGBA1C 6.7 01/18/2016   Lab Results  Component Value Date   VITAMINB12 220 11/20/2015    ASSESSMENT AND PLAN 70 y.o. year old male  has a past medical history of Abnormality of gait (01/14/2016); Alcohol abuse (2009); Alcohol withdrawal seizure ([place]) (2013); Anemia (2011); Cataract; Cataract; Chronic renal insufficiency, stage III (moderate) (02/26/2014); Depression with anxiety; Diabetes mellitus type II; Diabetes  mellitus without complication ([place]); Hearing impairment; HTN (hypertension); HTN (hypertension), benign (02/26/2014); Hyperlipemia; Hypertension; Seizures ([place]); and Type II or unspecified type diabetes mellitus with neurological manifestations, not stated as uncontrolled (02/26/2014). here with:  1. Seizures  Overall patient is doing well. He denies any seizure events. He will continue on Depakote 500 mg 3 times a day. He just recently had blood work in April that was unremarkable. We will recheck blood work at the next visit. Patient advised that if he has any seizure events he should let us know. Follow-up in 6 months or sooner if needed.     Ward Givens, MSN, NP-C 05/15/2016, 9:29 AM [facility] [address], [place], [place] [address] [phone]

## 2016-05-15 NOTE — Progress Notes (Signed)
I have read the note, and I agree with the clinical assessment and plan.  Senia Even KEITH   

## 2016-05-15 NOTE — Patient Instructions (Signed)
Continue Depakote 500 mg three times a day Blood work at next visit If your symptoms worsen or you develop new symptoms please let us know.

## 2016-05-16 DIAGNOSIS — F29 Unspecified psychosis not due to a substance or known physiological condition: Secondary | ICD-10-CM | POA: Diagnosis not present

## 2016-05-16 DIAGNOSIS — F329 Major depressive disorder, single episode, unspecified: Secondary | ICD-10-CM | POA: Diagnosis not present

## 2016-05-16 DIAGNOSIS — F419 Anxiety disorder, unspecified: Secondary | ICD-10-CM | POA: Diagnosis not present

## 2016-05-16 DIAGNOSIS — F39 Unspecified mood [affective] disorder: Secondary | ICD-10-CM | POA: Diagnosis not present

## 2016-05-23 ENCOUNTER — Encounter: Payer: Self-pay | Admitting: Nurse Practitioner

## 2016-05-23 ENCOUNTER — Non-Acute Institutional Stay (SKILLED_NURSING_FACILITY): Payer: Medicare Other | Admitting: Nurse Practitioner

## 2016-05-23 DIAGNOSIS — E1122 Type 2 diabetes mellitus with diabetic chronic kidney disease: Secondary | ICD-10-CM

## 2016-05-23 DIAGNOSIS — N183 Chronic kidney disease, stage 3 unspecified: Secondary | ICD-10-CM

## 2016-05-23 NOTE — Progress Notes (Signed)
Nursing Home Location:  [facility]  Place of Service: SNF (31)  PCP: [name], MD  No Known Allergies  Chief Complaint  Patient presents with  . Acute Visit    Elevated glucose levels    HPI:  Patient is a 70 y.o. male seen today at [facility] due to elevated blood sugars. Pt with hx of ETOH abuse, DM, HTN, seizures, anemia, depression. Pt has had increase in blood sugars over the last month. tradjenta was added to glipizide but without significant reduction in blood sugars. A1c is worse as well. Pt has a lot of candy and sweets in room. Noncompliant with diet and does not understand the need for diet modifications despite education due to his cognitive impairment from hx of ETOH abuse. Denies neuropathy, blurred vision, constipation, diarrhea, increase in urination or appetite.   Review of Systems: by pt and staff Review of Systems  Constitutional: Negative for activity change, appetite change, chills, diaphoresis, fatigue, fever and unexpected weight change.  HENT: Negative for congestion.   Respiratory: Negative for cough and shortness of breath.   Cardiovascular: Negative for chest pain, palpitations and leg swelling.  Gastrointestinal: Negative for abdominal distention, constipation and diarrhea.  Genitourinary: Negative for difficulty urinating, dysuria and flank pain.  Musculoskeletal: Positive for gait problem. Negative for arthralgias and back pain.  Neurological: Negative for dizziness, seizures and facial asymmetry.  Psychiatric/Behavioral: Positive for agitation and behavioral problems. Negative for confusion.    Past Medical History:  Diagnosis Date  . Abnormality of gait 01/14/2016  . Alcohol abuse 2009   dependency as exhibited by ETOH withdrawal seizure.   . Alcohol withdrawal seizure ([place]) 2013  . Anemia 2011   Normocytic.   . Cataract   . Cataract   . Chronic renal insufficiency, stage III (moderate) 02/26/2014  . Depression with  anxiety   . Diabetes mellitus type II   . Diabetes mellitus without complication ([place])   . Hearing impairment   . HTN (hypertension)   . HTN (hypertension), benign 02/26/2014  . Hyperlipemia   . Hypertension   . Seizures ([place])   . Type II or unspecified type diabetes mellitus with neurological manifestations, not stated as uncontrolled 02/26/2014   Past Surgical History:  Procedure Laterality Date  . NO PAST SURGERIES     Social History:   reports that he has quit smoking. His smoking use included Cigars. His smokeless tobacco use includes Chew. He reports that he does not drink alcohol or use drugs.  Family History  Problem Relation Age of Onset  . Family history unknown: Yes    Medications: Patient's Medications  New Prescriptions   No medications on file  Previous Medications   ACETAMINOPHEN (TYLENOL) 500 MG TABLET    Take 2 tablets by mouth every morning and every evening for pain.   AMLODIPINE (NORVASC) 5 MG TABLET       ASPIRIN (ASPIRIN EC) 81 MG EC TABLET    Take 81 mg by mouth daily. Swallow whole.   FERROUS SULFATE 325 (65 FE) MG TABLET    Take 1 tablet (325 mg total) by mouth daily with breakfast.   FOLIC ACID (FOLVITE) 1 MG TABLET    Take 1 tablet (1 mg total) by mouth daily.   GLIPIZIDE (GLUCOTROL) 10 MG TABLET    Take 10 mg by mouth 2 (two) times daily before a meal.   LISINOPRIL (PRINIVIL,ZESTRIL) 5 MG TABLET    Take 5 mg by mouth daily.  LORAZEPAM (ATIVAN) 0.5 MG TABLET    Take 0.25 mg by mouth 3 (three) times daily as needed for anxiety.   PRAVASTATIN (PRAVACHOL) 10 MG TABLET    Take 10 mg by mouth at bedtime.    QUETIAPINE (SEROQUEL) 100 MG TABLET    Take 100 mg by mouth at bedtime.   QUETIAPINE (SEROQUEL) 50 MG TABLET    Take 50 mg by mouth every morning.   SERTRALINE (ZOLOFT) 50 MG TABLET    Take 50 mg by mouth daily.   THIAMINE (VITAMIN B-1) 100 MG TABLET    Take 100 mg by mouth daily.   TRADJENTA 5 MG TABS TABLET    Take 5 mg by mouth daily.    VALPROIC  ACID (DEPAKENE) 250 MG/5ML SYRUP    Take 10 mLs (500 mg total) by mouth 3 (three) times daily.  Modified Medications   No medications on file  Discontinued Medications   No medications on file     Physical Exam: Vitals:   05/23/16 1308  BP: 135/76  Pulse: 67  Resp: 20  Temp: 97.3 F (36.3 C)  SpO2: 96%  Weight: 213 lb (96.6 kg)  Height: 5\' 7"  (1.702 m)    Physical Exam  Constitutional: No distress.  HENT:  Head: Normocephalic and atraumatic.  Nose: Nose normal.  Mouth/Throat: Oropharynx is clear and moist. No oropharyngeal exudate.  Edentulous. Very HOH  Eyes: Conjunctivae and EOM are normal. Pupils are equal, round, and reactive to light. Right eye exhibits no discharge. Left eye exhibits no discharge.  Neck: Normal range of motion. Neck supple.  Cardiovascular: Normal rate and regular rhythm.   No murmur heard. BLE edema +1  Pulmonary/Chest: Effort normal and breath sounds normal.  Abdominal: Soft. Bowel sounds are normal.  Musculoskeletal: He exhibits no edema.  Neurological: He is alert.  Skin: Skin is warm and dry. He is not diaphoretic.  Psychiatric: He has a normal mood and affect.    Labs reviewed: Basic Metabolic Panel:  Recent Labs  11/21/15 0811 12/29/15 2240 12/30/15 1252  01/14/16 1002 02/01/16 05/09/16  NA 139 135  --   < > 138 138 134*  K 4.3 4.2  --   < > 5.6* 4.4 4.6  CL 101 102  --   --  100  --   --   CO2 26 20*  --   --  25  --   --   GLUCOSE 150* 209*  --   --  196*  --   --   BUN 17 10  --   < > 20 20 22*  CREATININE 1.53* 1.55*  --   < > 1.41* 1.4* 1.4*  CALCIUM 9.4 8.6*  --   --  9.8  --   --   MG  --   --  1.7  --   --   --   --   < > = values in this interval not displayed. Liver Function Tests:  Recent Labs  11/19/15 1945 12/30/15 1252 01/04/16 01/14/16 1002  AST 23 22 12* 12  ALT 10* 11* 10 15  ALKPHOS 73 57 54 60  BILITOT 0.4 0.7  --  0.2  PROT 7.4 6.4*  --  6.3  ALBUMIN 4.2 3.5  --  4.0   No results for input(s):  LIPASE, AMYLASE in the last 8760 hours.  Recent Labs  01/14/16 1002  AMMONIA 42   CBC:  Recent Labs  12/29/15 2240 12/30/15 1655 01/04/16 01/14/16  1002 05/09/16  WBC 7.0 7.4 9.2 5.6 5.0  NEUTROABS  --   --   --  3.1  --   HGB 9.1* 9.9* 11.1*  --  12.6*  HCT 28.7* 31.6* 35* 37.0* 38*  MCV 85.2 86.3  --  88  --   PLT 194 215 248 171 154   TSH: No results for input(s): TSH in the last 8760 hours. A1C: Lab Results  Component Value Date   HGBA1C 7.8 05/09/2016   Lipid Panel:  Recent Labs  02/01/16 05/09/16  CHOL 145 159  HDL 29* 33*  LDLCALC 65 72  TRIG 253* 269*     Assessment/Plan 1. Type 2 diabetes mellitus with stage 3 chronic kidney disease, without long-term current use of insulin (HCC) A1c worse at 7.8, blood sugars ranging from 200-400s. Pt eating a lot of snack and foods high in sugar. Discussed need to cut back and avoid these. To have staff offer him fluids without sugar. To start metformin 500 mg BID and advance to 1000 mg BID  -staff to cont to take blood sugars   William [name] K. Harle Battiest  [facility] [phone] 8 am - 5 pm) [phone] (after hours)

## 2016-05-28 ENCOUNTER — Non-Acute Institutional Stay (SKILLED_NURSING_FACILITY): Payer: Medicare Other | Admitting: Nurse Practitioner

## 2016-05-28 ENCOUNTER — Encounter: Payer: Self-pay | Admitting: Nurse Practitioner

## 2016-05-28 DIAGNOSIS — B86 Scabies: Secondary | ICD-10-CM | POA: Diagnosis not present

## 2016-05-28 NOTE — Progress Notes (Signed)
Nursing Home Location:  [facility]  Place of Service: SNF (31)  PCP: [name], MD  No Known Allergies  Chief Complaint  Patient presents with  . Acute Visit    Itching    HPI:  Patient is a 70 y.o. male seen today at [facility] due to intense itch. Pt with hx of ETOH abuse, DM, HTN, seizures, anemia, depression.  Pt with complaints of severe itching to bilateral legs and chest. Has itched areas to the point of bleeding. Called MD on call and was given orders for  permethrin topical which just came in yesterday and he was treated last night pt due to shower today.   Review of Systems: by pt and staff Review of Systems  Skin: Positive for rash and wound.       pruritis     Past Medical History:  Diagnosis Date  . Abnormality of gait 01/14/2016  . Alcohol abuse 2009   dependency as exhibited by ETOH withdrawal seizure.   . Alcohol withdrawal seizure ([place]) 2013  . Anemia 2011   Normocytic.   . Cataract   . Cataract   . Chronic renal insufficiency, stage III (moderate) 02/26/2014  . Depression with anxiety   . Diabetes mellitus type II   . Diabetes mellitus without complication ([place])   . Hearing impairment   . HTN (hypertension)   . HTN (hypertension), benign 02/26/2014  . Hyperlipemia   . Hypertension   . Seizures ([place])   . Type II or unspecified type diabetes mellitus with neurological manifestations, not stated as uncontrolled 02/26/2014   Past Surgical History:  Procedure Laterality Date  . NO PAST SURGERIES     Social History:   reports that he has quit smoking. His smoking use included Cigars. His smokeless tobacco use includes Chew. He reports that he does not drink alcohol or use drugs.  Family History  Problem Relation Age of Onset  . Family history unknown: Yes    Medications: Patient's Medications  New Prescriptions   No medications on file  Previous Medications   ACETAMINOPHEN (TYLENOL) 500 MG TABLET    Take 2 tablets by  mouth every morning and every evening for pain.   AMLODIPINE (NORVASC) 5 MG TABLET       ASPIRIN (ASPIRIN EC) 81 MG EC TABLET    Take 81 mg by mouth daily. Swallow whole.   FERROUS SULFATE 325 (65 FE) MG TABLET    Take 1 tablet (325 mg total) by mouth daily with breakfast.   FOLIC ACID (FOLVITE) 1 MG TABLET    Take 1 tablet (1 mg total) by mouth daily.   GLIPIZIDE (GLUCOTROL) 10 MG TABLET    Take 10 mg by mouth 2 (two) times daily before a meal.   LISINOPRIL (PRINIVIL,ZESTRIL) 5 MG TABLET    Take 5 mg by mouth daily.   LORAZEPAM (ATIVAN) 0.5 MG TABLET    Take 0.25 mg by mouth 3 (three) times daily as needed for anxiety.   METFORMIN (GLUCOPHAGE) 500 MG TABLET    Give 500 mg by mouth twice daily with meals for 1 week (Stop 05/30/16). Then increase to 1000 mg twice daily with meals.   PRAVASTATIN (PRAVACHOL) 10 MG TABLET    Take 10 mg by mouth at bedtime.    QUETIAPINE (SEROQUEL) 100 MG TABLET    Take 100 mg by mouth at bedtime.   QUETIAPINE (SEROQUEL) 50 MG TABLET    Take 50 mg by mouth every morning.  SERTRALINE (ZOLOFT) 50 MG TABLET    Take 50 mg by mouth daily.   THIAMINE (VITAMIN B-1) 100 MG TABLET    Take 100 mg by mouth daily.   TRADJENTA 5 MG TABS TABLET    Take 5 mg by mouth daily.    VALPROIC ACID (DEPAKENE) 250 MG/5ML SYRUP    Take 10 mLs (500 mg total) by mouth 3 (three) times daily.  Modified Medications   No medications on file  Discontinued Medications   No medications on file     Physical Exam: Vitals:   05/28/16 1315  BP: 139/64  Pulse: 78  Resp: 20  Temp: 97.9 F (36.6 C)  Weight: 213 lb (96.6 kg)  Height: 5\' 7"  (1.702 m)    Physical Exam  Constitutional: No distress.  HENT:  Head: Normocephalic and atraumatic.  Nose: Nose normal.  Mouth/Throat: Oropharynx is clear and moist. No oropharyngeal exudate.  Edentulous. Very HOH  Eyes: Conjunctivae and EOM are normal. Pupils are equal, round, and reactive to light. Right eye exhibits no discharge. Left eye exhibits  no discharge.  Neck: Normal range of motion. Neck supple.  Cardiovascular: Normal rate and regular rhythm.   No murmur heard. BLE edema +1  Pulmonary/Chest: Effort normal and breath sounds normal.  Abdominal: Soft. Bowel sounds are normal.  Musculoskeletal: He exhibits no edema.  Neurological: He is alert.  Skin: Skin is warm and dry. He is not diaphoretic.  Linear excoriation with multiple abrasions from stratching to chest and bilateral legs, no signs of infection noted  Psychiatric: He has a normal mood and affect.    Labs reviewed: Basic Metabolic Panel:  Recent Labs  11/21/15 0811 12/29/15 2240 12/30/15 1252  01/14/16 1002 02/01/16 05/09/16  NA 139 135  --   < > 138 138 134*  K 4.3 4.2  --   < > 5.6* 4.4 4.6  CL 101 102  --   --  100  --   --   CO2 26 20*  --   --  25  --   --   GLUCOSE 150* 209*  --   --  196*  --   --   BUN 17 10  --   < > 20 20 22*  CREATININE 1.53* 1.55*  --   < > 1.41* 1.4* 1.4*  CALCIUM 9.4 8.6*  --   --  9.8  --   --   MG  --   --  1.7  --   --   --   --   < > = values in this interval not displayed. Liver Function Tests:  Recent Labs  11/19/15 1945 12/30/15 1252 01/04/16 01/14/16 1002  AST 23 22 12* 12  ALT 10* 11* 10 15  ALKPHOS 73 57 54 60  BILITOT 0.4 0.7  --  0.2  PROT 7.4 6.4*  --  6.3  ALBUMIN 4.2 3.5  --  4.0   No results for input(s): LIPASE, AMYLASE in the last 8760 hours.  Recent Labs  01/14/16 1002  AMMONIA 42   CBC:  Recent Labs  12/29/15 2240 12/30/15 1655 01/04/16 01/14/16 1002 05/09/16  WBC 7.0 7.4 9.2 5.6 5.0  NEUTROABS  --   --   --  3.1  --   HGB 9.1* 9.9* 11.1*  --  12.6*  HCT 28.7* 31.6* 35* 37.0* 38*  MCV 85.2 86.3  --  88  --   PLT 194 215 248 171 154   TSH:  No results for input(s): TSH in the last 8760 hours. A1C: Lab Results  Component Value Date   HGBA1C 7.8 05/09/2016   Lipid Panel:  Recent Labs  02/01/16 05/09/16  CHOL 145 159  HDL 29* 33*  LDLCALC 65 72  TRIG 253* 269*      Assessment/Plan 1. Scabies -Permethrin topical applied last night, to have shower today and have room deep cleaned. Nursing staff notified to also have roommate treated. To monitor skin for secondary infection to open areas  Total time 30 min  time greater than 50% of total time spent doing pt counseled and coordination of care regarding plan of care regarding scabies diagnosis    William [name]. Harle Battiest  [facility] [phone] 8 am - 5 pm) [phone] (after hours)

## 2016-06-06 ENCOUNTER — Non-Acute Institutional Stay (SKILLED_NURSING_FACILITY): Payer: Medicare Other | Admitting: Nurse Practitioner

## 2016-06-06 ENCOUNTER — Encounter: Payer: Self-pay | Admitting: Nurse Practitioner

## 2016-06-06 DIAGNOSIS — D509 Iron deficiency anemia, unspecified: Secondary | ICD-10-CM

## 2016-06-06 DIAGNOSIS — E1122 Type 2 diabetes mellitus with diabetic chronic kidney disease: Secondary | ICD-10-CM

## 2016-06-06 DIAGNOSIS — F1010 Alcohol abuse, uncomplicated: Secondary | ICD-10-CM | POA: Diagnosis not present

## 2016-06-06 DIAGNOSIS — N183 Chronic kidney disease, stage 3 (moderate): Secondary | ICD-10-CM | POA: Diagnosis not present

## 2016-06-06 DIAGNOSIS — I10 Essential (primary) hypertension: Secondary | ICD-10-CM

## 2016-06-06 DIAGNOSIS — F1011 Alcohol abuse, in remission: Secondary | ICD-10-CM

## 2016-06-06 DIAGNOSIS — F29 Unspecified psychosis not due to a substance or known physiological condition: Secondary | ICD-10-CM

## 2016-06-06 NOTE — Progress Notes (Signed)
Nursing Home Location:  [facility]  Place of Service: SNF (31)  PCP: [name], MD  No Known Allergies  Chief Complaint  Patient presents with  . Medical Management of Chronic Issues    Routine Visit     HPI:  Patient is a 70 y.o. male seen today at [facility] for routine follow up on chronic conditions. Pt with hx of ETOH abuse, DM, HTN, seizures, anemia, depression. In the last month has has been evaluated for worsening blood sugars. Pt was on glipizide and tradjenta, metformin was added. Blood sugars remain 200-300.  Pt was also treated for scabies- currently without itching and rash has improved.  Pt conts to use dip, and has a mouth full during visit. Spits into trash can.  Cookies and sweets around room.   Review of Systems: by pt and staff Review of Systems  Constitutional: Negative for activity change, appetite change, chills, diaphoresis, fatigue, fever and unexpected weight change.  HENT: Negative for congestion.   Respiratory: Negative for cough and shortness of breath.   Cardiovascular: Negative for chest pain, palpitations and leg swelling.  Gastrointestinal: Negative for abdominal distention, constipation and diarrhea.  Genitourinary: Negative for difficulty urinating, dysuria and flank pain.  Musculoskeletal: Positive for gait problem. Negative for arthralgias and back pain.  Neurological: Negative for dizziness, seizures and facial asymmetry.  Psychiatric/Behavioral: Positive for agitation and behavioral problems. Negative for confusion.    Past Medical History:  Diagnosis Date  . Abnormality of gait 01/14/2016  . Alcohol abuse 2009   dependency as exhibited by ETOH withdrawal seizure.   . Alcohol withdrawal seizure ([place]) 2013  . Anemia 2011   Normocytic.   . Cataract   . Cataract   . Chronic renal insufficiency, stage III (moderate) 02/26/2014  . Depression with anxiety   . Diabetes mellitus type II   . Diabetes mellitus without  complication ([place])   . Hearing impairment   . HTN (hypertension)   . HTN (hypertension), benign 02/26/2014  . Hyperlipemia   . Hypertension   . Seizures ([place])   . Type II or unspecified type diabetes mellitus with neurological manifestations, not stated as uncontrolled 02/26/2014   Past Surgical History:  Procedure Laterality Date  . NO PAST SURGERIES     Social History:   reports that he has quit smoking. His smoking use included Cigars. His smokeless tobacco use includes Chew. He reports that he does not drink alcohol or use drugs.  Family History  Problem Relation Age of Onset  . Family history unknown: Yes    Medications: Patient's Medications  New Prescriptions   No medications on file  Previous Medications   ACETAMINOPHEN (TYLENOL) 500 MG TABLET    Take 2 tablets by mouth every morning and every evening for pain.   AMLODIPINE (NORVASC) 5 MG TABLET       ASPIRIN (ASPIRIN EC) 81 MG EC TABLET    Take 81 mg by mouth daily. Swallow whole.   FERROUS SULFATE 325 (65 FE) MG TABLET    Take 1 tablet (325 mg total) by mouth daily with breakfast.   FOLIC ACID (FOLVITE) 1 MG TABLET    Take 1 tablet (1 mg total) by mouth daily.   GLIPIZIDE (GLUCOTROL) 10 MG TABLET    Take 10 mg by mouth 2 (two) times daily before a meal.   LISINOPRIL (PRINIVIL,ZESTRIL) 5 MG TABLET    Take 5 mg by mouth daily.   LORAZEPAM (ATIVAN) 0.5 MG TABLET  Take 0.25 mg by mouth 3 (three) times daily as needed for anxiety.   METFORMIN (GLUCOPHAGE) 1000 MG TABLET    Take 1,000 mg by mouth 2 (two) times daily with a meal.   PRAVASTATIN (PRAVACHOL) 10 MG TABLET    Take 10 mg by mouth at bedtime.    QUETIAPINE (SEROQUEL) 100 MG TABLET    Take 100 mg by mouth at bedtime.   QUETIAPINE (SEROQUEL) 50 MG TABLET    Take 50 mg by mouth every morning.   SERTRALINE (ZOLOFT) 50 MG TABLET    Take 50 mg by mouth daily.   THIAMINE (VITAMIN B-1) 100 MG TABLET    Take 100 mg by mouth daily.   TRADJENTA 5 MG TABS TABLET    Take 5 mg  by mouth daily.    VALPROIC ACID (DEPAKENE) 250 MG/5ML SYRUP    Take 10 mLs (500 mg total) by mouth 3 (three) times daily.  Modified Medications   No medications on file  Discontinued Medications   METFORMIN (GLUCOPHAGE) 500 MG TABLET    Give 500 mg by mouth twice daily with meals for 1 week (Stop 05/30/16). Then increase to 1000 mg twice daily with meals.     Physical Exam: Vitals:   06/06/16 1038  BP: 126/70  Pulse: 86  Resp: 20  Temp: 98.8 F (37.1 C)  TempSrc: Oral  Weight: 215 lb (97.5 kg)  Height: 5\' 7"  (1.702 m)    Physical Exam  Constitutional: No distress.  HENT:  Head: Normocephalic and atraumatic.  Nose: Nose normal.  Mouth/Throat: Oropharynx is clear and moist. No oropharyngeal exudate.  Edentulous. Very HOH  Eyes: Conjunctivae and EOM are normal. Pupils are equal, round, and reactive to light. Right eye exhibits no discharge. Left eye exhibits no discharge.  Neck: Normal range of motion. Neck supple.  Cardiovascular: Normal rate and regular rhythm.   No murmur heard. BLE edema +1  Pulmonary/Chest: Effort normal and breath sounds normal.  Abdominal: Soft. Bowel sounds are normal.  Musculoskeletal: He exhibits no edema.  Neurological: He is alert.  Skin: Skin is warm and dry. He is not diaphoretic.  Psychiatric: He has a normal mood and affect.    Labs reviewed: Basic Metabolic Panel:  Recent Labs  11/21/15 0811 12/29/15 2240 12/30/15 1252  01/14/16 1002 02/01/16 05/09/16  NA 139 135  --   < > 138 138 134*  K 4.3 4.2  --   < > 5.6* 4.4 4.6  CL 101 102  --   --  100  --   --   CO2 26 20*  --   --  25  --   --   GLUCOSE 150* 209*  --   --  196*  --   --   BUN 17 10  --   < > 20 20 22*  CREATININE 1.53* 1.55*  --   < > 1.41* 1.4* 1.4*  CALCIUM 9.4 8.6*  --   --  9.8  --   --   MG  --   --  1.7  --   --   --   --   < > = values in this interval not displayed. Liver Function Tests:  Recent Labs  11/19/15 1945 12/30/15 1252 01/04/16 01/14/16 1002    AST 23 22 12* 12  ALT 10* 11* 10 15  ALKPHOS 73 57 54 60  BILITOT 0.4 0.7  --  0.2  PROT 7.4 6.4*  --  6.3  ALBUMIN 4.2 3.5  --  4.0   No results for input(s): LIPASE, AMYLASE in the last 8760 hours.  Recent Labs  01/14/16 1002  AMMONIA 42   CBC:  Recent Labs  12/29/15 2240 12/30/15 1655 01/04/16 01/14/16 1002 05/09/16  WBC 7.0 7.4 9.2 5.6 5.0  NEUTROABS  --   --   --  3.1  --   HGB 9.1* 9.9* 11.1*  --  12.6*  HCT 28.7* 31.6* 35* 37.0* 38*  MCV 85.2 86.3  --  88  --   PLT 194 215 248 171 154   TSH: No results for input(s): TSH in the last 8760 hours. A1C: Lab Results  Component Value Date   HGBA1C 7.8 05/09/2016   Lipid Panel:  Recent Labs  02/01/16 05/09/16  CHOL 145 159  HDL 29* 33*  LDLCALC 65 72  TRIG 253* 269*     Assessment/Plan 1. Type 2 diabetes mellitus with stage 3 chronic kidney disease, without long-term current use of insulin (HCC) Pt currently on metformin, tradjenta and glipizide without control of diabetes. Will add lantus 5 units sq qhs at this time and cont to monitor.   2. Essential hypertension Blood pressure stable, cont on lisinopril and norvasc  3. History of alcohol abuse Cont on folic acid and thiamine  4. Psychosis, unspecified psychosis type Followed by psych, remains stable. Cont current regimen   5. Iron deficiency anemia -stable, conts on iron supplements at this time    Janett Billow K. Harle Battiest  [facility] [phone] 8 am - 5 pm) [phone] (after hours)

## 2016-06-09 DIAGNOSIS — Z79899 Other long term (current) drug therapy: Secondary | ICD-10-CM | POA: Diagnosis not present

## 2016-06-10 LAB — BASIC METABOLIC PANEL
BUN: 32 mg/dL — AB (ref 4–21)
Creatinine: 2.3 mg/dL — AB (ref 0.6–1.3)
GLUCOSE: 176 mg/dL
POTASSIUM: 5.3 mmol/L (ref 3.4–5.3)
Sodium: 134 mmol/L — AB (ref 137–147)

## 2016-06-11 ENCOUNTER — Encounter: Payer: Self-pay | Admitting: Nurse Practitioner

## 2016-06-11 ENCOUNTER — Non-Acute Institutional Stay (SKILLED_NURSING_FACILITY): Payer: Medicare Other | Admitting: Nurse Practitioner

## 2016-06-11 DIAGNOSIS — N179 Acute kidney failure, unspecified: Secondary | ICD-10-CM

## 2016-06-11 DIAGNOSIS — N183 Chronic kidney disease, stage 3 (moderate): Secondary | ICD-10-CM

## 2016-06-11 DIAGNOSIS — I10 Essential (primary) hypertension: Secondary | ICD-10-CM | POA: Diagnosis not present

## 2016-06-11 DIAGNOSIS — E1122 Type 2 diabetes mellitus with diabetic chronic kidney disease: Secondary | ICD-10-CM | POA: Diagnosis not present

## 2016-06-11 NOTE — Progress Notes (Signed)
Nursing Home Location:  [facility]  Place of Service: SNF (31)  PCP: [name], MD  No Known Allergies  Chief Complaint  Patient presents with  . Acute Visit    Abnormal labs    HPI:  Patient is a 70 y.o. male seen today at [facility] for follow up lab. Pt with hx of ETOH abuse, DM, HTN, seizures, anemia, depression.  Recent lab showing acute rise in BUN and Cr.  Pt recently placed on metformin due to uncontrolled diabetes. lantus has also been added. Blood sugars have improved with recent changes in medication.  Pt mental status at baseline.  No changes to bowel or bladder. Does not drink much water. Mostly coffee.   Review of Systems: by pt and staff Review of Systems  Constitutional: Negative for activity change, appetite change, chills, diaphoresis, fatigue, fever and unexpected weight change.  HENT: Negative for congestion.   Respiratory: Negative for cough and shortness of breath.   Cardiovascular: Negative for chest pain, palpitations and leg swelling.  Gastrointestinal: Negative for abdominal distention, constipation and diarrhea.  Genitourinary: Negative for difficulty urinating, dysuria and flank pain.  Musculoskeletal: Positive for gait problem. Negative for arthralgias and back pain.  Neurological: Negative for dizziness, seizures and facial asymmetry.  Psychiatric/Behavioral: Positive for agitation and behavioral problems. Negative for confusion.    Past Medical History:  Diagnosis Date  . Abnormality of gait 01/14/2016  . Alcohol abuse 2009   dependency as exhibited by ETOH withdrawal seizure.   . Alcohol withdrawal seizure ([place]) 2013  . Anemia 2011   Normocytic.   . Cataract   . Cataract   . Chronic renal insufficiency, stage III (moderate) 02/26/2014  . Depression with anxiety   . Diabetes mellitus type II   . Diabetes mellitus without complication ([place])   . Hearing impairment   . HTN (hypertension)   . HTN (hypertension),  benign 02/26/2014  . Hyperlipemia   . Hypertension   . Seizures ([place])   . Type II or unspecified type diabetes mellitus with neurological manifestations, not stated as uncontrolled 02/26/2014   Past Surgical History:  Procedure Laterality Date  . NO PAST SURGERIES     Social History:   reports that he has quit smoking. His smoking use included Cigars. His smokeless tobacco use includes Chew. He reports that he does not drink alcohol or use drugs.  Family History  Problem Relation Age of Onset  . Family history unknown: Yes    Medications: Patient's Medications  New Prescriptions   No medications on file  Previous Medications   ACETAMINOPHEN (TYLENOL) 500 MG TABLET    Take 2 tablets by mouth every morning and every evening for pain.   AMLODIPINE (NORVASC) 5 MG TABLET       ASPIRIN (ASPIRIN EC) 81 MG EC TABLET    Take 81 mg by mouth daily. Swallow whole.   FERROUS SULFATE 325 (65 FE) MG TABLET    Take 1 tablet (325 mg total) by mouth daily with breakfast.   FOLIC ACID (FOLVITE) 1 MG TABLET    Take 1 tablet (1 mg total) by mouth daily.   GLIPIZIDE (GLUCOTROL) 10 MG TABLET    Take 10 mg by mouth 2 (two) times daily before a meal.   INSULIN GLARGINE (LANTUS) 100 UNIT/ML INJECTION    Inject 10 Units into the skin at bedtime.   LISINOPRIL (PRINIVIL,ZESTRIL) 5 MG TABLET    Take 5 mg by mouth daily.   LORAZEPAM (ATIVAN)  0.5 MG TABLET    Take 0.25 mg by mouth 3 (three) times daily as needed for anxiety.   METFORMIN (GLUCOPHAGE) 1000 MG TABLET    Take 1,000 mg by mouth 2 (two) times daily with a meal.   PRAVASTATIN (PRAVACHOL) 10 MG TABLET    Take 10 mg by mouth at bedtime.    QUETIAPINE (SEROQUEL) 100 MG TABLET    Take 100 mg by mouth at bedtime.   QUETIAPINE (SEROQUEL) 50 MG TABLET    Take 50 mg by mouth every morning.   SERTRALINE (ZOLOFT) 50 MG TABLET    Take 50 mg by mouth daily.   THIAMINE (VITAMIN B-1) 100 MG TABLET    Take 100 mg by mouth daily.   TRADJENTA 5 MG TABS TABLET    Take 5  mg by mouth daily.    VALPROIC ACID (DEPAKENE) 250 MG/5ML SYRUP    Take 10 mLs (500 mg total) by mouth 3 (three) times daily.  Modified Medications   No medications on file  Discontinued Medications   No medications on file     Physical Exam: Vitals:   06/11/16 1150  BP: 117/79  Pulse: 76  Resp: 20  Temp: 98.8 F (37.1 C)  SpO2: 96%  Weight: 215 lb (97.5 kg)  Height: 5\' 7"  (1.702 m)    Physical Exam  Constitutional: No distress.  HENT:  Head: Normocephalic and atraumatic.  Nose: Nose normal.  Mouth/Throat: Oropharynx is clear and moist. No oropharyngeal exudate.  Edentulous. Very HOH  Eyes: Conjunctivae and EOM are normal. Pupils are equal, round, and reactive to light. Right eye exhibits no discharge. Left eye exhibits no discharge.  Neck: Normal range of motion. Neck supple.  Cardiovascular: Normal rate and regular rhythm.   No murmur heard. BLE edema +1  Pulmonary/Chest: Effort normal and breath sounds normal.  Abdominal: Soft. Bowel sounds are normal.  Musculoskeletal: He exhibits no edema.  Neurological: He is alert.  Skin: Skin is warm and dry. He is not diaphoretic.  Psychiatric: He has a normal mood and affect.    Labs reviewed: Basic Metabolic Panel:  Recent Labs  11/21/15 0811 12/29/15 2240 12/30/15 1252  01/14/16 1002 02/01/16 05/09/16 06/10/16  NA 139 135  --   < > 138 138 134* 134*  K 4.3 4.2  --   < > 5.6* 4.4 4.6 5.3  CL 101 102  --   --  100  --   --   --   CO2 26 20*  --   --  25  --   --   --   GLUCOSE 150* 209*  --   --  196*  --   --   --   BUN 17 10  --   < > 20 20 22* 32*  CREATININE 1.53* 1.55*  --   < > 1.41* 1.4* 1.4* 2.3*  CALCIUM 9.4 8.6*  --   --  9.8  --   --   --   MG  --   --  1.7  --   --   --   --   --   < > = values in this interval not displayed. Liver Function Tests:  Recent Labs  11/19/15 1945 12/30/15 1252 01/04/16 01/14/16 1002  AST 23 22 12* 12  ALT 10* 11* 10 15  ALKPHOS 73 57 54 60  BILITOT 0.4 0.7  --   0.2  PROT 7.4 6.4*  --  6.3  ALBUMIN 4.2  3.5  --  4.0   No results for input(s): LIPASE, AMYLASE in the last 8760 hours.  Recent Labs  01/14/16 1002  AMMONIA 42   CBC:  Recent Labs  12/29/15 2240 12/30/15 1655 01/04/16 01/14/16 1002 05/09/16  WBC 7.0 7.4 9.2 5.6 5.0  NEUTROABS  --   --   --  3.1  --   HGB 9.1* 9.9* 11.1*  --  12.6*  HCT 28.7* 31.6* 35* 37.0* 38*  MCV 85.2 86.3  --  88  --   PLT 194 215 248 171 154   TSH: No results for input(s): TSH in the last 8760 hours. A1C: Lab Results  Component Value Date   HGBA1C 7.8 05/09/2016   Lipid Panel:  Recent Labs  02/01/16 05/09/16  CHOL 145 159  HDL 29* 33*  LDLCALC 65 72  TRIG 253* 269*     Assessment/Plan 1. Acute kidney injury ([place]) -will stop metformin and lisinopril at this time -encourage good hydration with water -wil follow up bmp on 9/18/117  2. Type 2 diabetes mellitus with stage 3 chronic kidney disease, without long-term current use of insulin (HCC) Will dc metformin due to elevated BUN/Cr -will increase lantus to 12 units sq qhs  -cont ACHS blood sugars  3. Hypertension   Controlled, will dc lisinopril due to elevated BUN/CR VS q shift  Teandra Harlan K. Harle Battiest  [facility] [phone] 8 am - 5 pm) [phone] (after hours)

## 2016-06-16 DIAGNOSIS — Z79899 Other long term (current) drug therapy: Secondary | ICD-10-CM | POA: Diagnosis not present

## 2016-06-17 LAB — BASIC METABOLIC PANEL
BUN: 22 mg/dL — AB (ref 4–21)
Creatinine: 1.5 mg/dL — AB (ref 0.6–1.3)
Glucose: 229 mg/dL
POTASSIUM: 5.5 mmol/L — AB (ref 3.4–5.3)
SODIUM: 135 mmol/L — AB (ref 137–147)

## 2016-06-18 ENCOUNTER — Non-Acute Institutional Stay (SKILLED_NURSING_FACILITY): Payer: Medicare Other | Admitting: Nurse Practitioner

## 2016-06-18 ENCOUNTER — Encounter: Payer: Self-pay | Admitting: Nurse Practitioner

## 2016-06-18 DIAGNOSIS — N183 Chronic kidney disease, stage 3 (moderate): Secondary | ICD-10-CM

## 2016-06-18 DIAGNOSIS — I10 Essential (primary) hypertension: Secondary | ICD-10-CM | POA: Diagnosis not present

## 2016-06-18 DIAGNOSIS — N179 Acute kidney failure, unspecified: Secondary | ICD-10-CM | POA: Diagnosis not present

## 2016-06-18 DIAGNOSIS — E1122 Type 2 diabetes mellitus with diabetic chronic kidney disease: Secondary | ICD-10-CM | POA: Diagnosis not present

## 2016-06-18 NOTE — Progress Notes (Signed)
Nursing Home Location:  [facility]  Place of Service: SNF (31)  PCP: [name], MD  No Known Allergies  Chief Complaint  Patient presents with  . Acute Visit    Abnormal labs    HPI:  Patient is a 70 y.o. male seen today at [facility] for follow up on abnormal labs. Pt with hx of ETOH abuse, DM, HTN, seizures, anemia, depression. Pt was seen last week due to acute rise in BUN/CR. There was no change to pts baseline mental status or intake. Metformin and lisinopril was stopped. lantus was increased to 12 units. Blood sugars ranging from 117-315.  Pt reports he is eating and drinking well. Attempting to cut back on sugars/sweets Review of Systems: by pt and staff Review of Systems  Constitutional: Negative for activity change, appetite change, chills, diaphoresis, fatigue, fever and unexpected weight change.  HENT: Negative for congestion.   Respiratory: Negative for cough and shortness of breath.   Cardiovascular: Negative for chest pain, palpitations and leg swelling.  Gastrointestinal: Negative for abdominal distention, constipation and diarrhea.  Genitourinary: Negative for difficulty urinating, dysuria and flank pain.  Musculoskeletal: Positive for gait problem. Negative for arthralgias and back pain.  Neurological: Negative for dizziness, seizures and facial asymmetry.  Psychiatric/Behavioral: Positive for agitation and behavioral problems. Negative for confusion.    Past Medical History:  Diagnosis Date  . Abnormality of gait 01/14/2016  . Alcohol abuse 2009   dependency as exhibited by ETOH withdrawal seizure.   . Alcohol withdrawal seizure ([place]) 2013  . Anemia 2011   Normocytic.   . Cataract   . Cataract   . Chronic renal insufficiency, stage III (moderate) 02/26/2014  . Depression with anxiety   . Diabetes mellitus type II   . Diabetes mellitus without complication ([place])   . Hearing impairment   . HTN (hypertension)   . HTN  (hypertension), benign 02/26/2014  . Hyperlipemia   . Hypertension   . Seizures ([place])   . Type II or unspecified type diabetes mellitus with neurological manifestations, not stated as uncontrolled 02/26/2014   Past Surgical History:  Procedure Laterality Date  . NO PAST SURGERIES     Social History:   reports that he has quit smoking. His smoking use included Cigars. His smokeless tobacco use includes Chew. He reports that he does not drink alcohol or use drugs.  Family History  Problem Relation Age of Onset  . Family history unknown: Yes    Medications: Patient's Medications  New Prescriptions   No medications on file  Previous Medications   ACETAMINOPHEN (TYLENOL) 500 MG TABLET    Take 2 tablets by mouth every morning and every evening for pain.   AMLODIPINE (NORVASC) 5 MG TABLET       ASPIRIN (ASPIRIN EC) 81 MG EC TABLET    Take 81 mg by mouth daily. Swallow whole.   FERROUS SULFATE 325 (65 FE) MG TABLET    Take 1 tablet (325 mg total) by mouth daily with breakfast.   FOLIC ACID (FOLVITE) 1 MG TABLET    Take 1 tablet (1 mg total) by mouth daily.   GLIPIZIDE (GLUCOTROL) 10 MG TABLET    Take 10 mg by mouth 2 (two) times daily before a meal.   INSULIN GLARGINE (LANTUS) 100 UNIT/ML INJECTION    Inject 12 Units into the skin at bedtime.    LORAZEPAM (ATIVAN) 0.5 MG TABLET    Take 0.25 mg by mouth 3 (three) times daily as  needed for anxiety.   PRAVASTATIN (PRAVACHOL) 10 MG TABLET    Take 10 mg by mouth at bedtime.    QUETIAPINE (SEROQUEL) 100 MG TABLET    Take 100 mg by mouth at bedtime.   QUETIAPINE (SEROQUEL) 50 MG TABLET    Take 50 mg by mouth every morning.   SERTRALINE (ZOLOFT) 50 MG TABLET    Take 50 mg by mouth daily.   THIAMINE (VITAMIN B-1) 100 MG TABLET    Take 100 mg by mouth daily.   TRADJENTA 5 MG TABS TABLET    Take 5 mg by mouth daily.    VALPROIC ACID (DEPAKENE) 250 MG/5ML SYRUP    Take 10 mLs (500 mg total) by mouth 3 (three) times daily.  Modified Medications   No  medications on file  Discontinued Medications   LISINOPRIL (PRINIVIL,ZESTRIL) 5 MG TABLET    Take 5 mg by mouth daily.   METFORMIN (GLUCOPHAGE) 1000 MG TABLET    Take 1,000 mg by mouth 2 (two) times daily with a meal.     Physical Exam: Vitals:   06/18/16 1133  BP: 114/72  Pulse: 82  Resp: 19  Temp: 97.1 F (36.2 C)  SpO2: 99%  Weight: 215 lb 3.2 oz (97.6 kg)  Height: 5\' 7"  (1.702 m)    Physical Exam  Constitutional: No distress.  Smells of urine  HENT:  Head: Normocephalic and atraumatic.  Nose: Nose normal.  Mouth/Throat: Oropharynx is clear and moist. No oropharyngeal exudate.  Edentulous. Very HOH  Eyes: Conjunctivae and EOM are normal. Pupils are equal, round, and reactive to light. Right eye exhibits no discharge. Left eye exhibits no discharge.  Neck: Normal range of motion. Neck supple.  Cardiovascular: Normal rate and regular rhythm.   No murmur heard. BLE edema +1  Pulmonary/Chest: Effort normal and breath sounds normal.  Abdominal: Soft. Bowel sounds are normal.  Musculoskeletal: He exhibits no edema.  Neurological: He is alert.  Skin: Skin is warm and dry. He is not diaphoretic.  Psychiatric: He has a normal mood and affect.    Labs reviewed: Basic Metabolic Panel:  Recent Labs  11/21/15 0811 12/29/15 2240 12/30/15 1252  01/14/16 1002  05/09/16 06/10/16 06/17/16  NA 139 135  --   < > 138  < > 134* 134* 135*  K 4.3 4.2  --   < > 5.6*  < > 4.6 5.3 5.5*  CL 101 102  --   --  100  --   --   --   --   CO2 26 20*  --   --  25  --   --   --   --   GLUCOSE 150* 209*  --   --  196*  --   --   --   --   BUN 17 10  --   < > 20  < > 22* 32* 22*  CREATININE 1.53* 1.55*  --   < > 1.41*  < > 1.4* 2.3* 1.5*  CALCIUM 9.4 8.6*  --   --  9.8  --   --   --   --   MG  --   --  1.7  --   --   --   --   --   --   < > = values in this interval not displayed. Liver Function Tests:  Recent Labs  11/19/15 1945 12/30/15 1252 01/04/16 01/14/16 1002  AST 23 22 12* 12    ALT  10* 11* 10 15  ALKPHOS 73 57 54 60  BILITOT 0.4 0.7  --  0.2  PROT 7.4 6.4*  --  6.3  ALBUMIN 4.2 3.5  --  4.0   No results for input(s): LIPASE, AMYLASE in the last 8760 hours.  Recent Labs  01/14/16 1002  AMMONIA 42   CBC:  Recent Labs  12/29/15 2240 12/30/15 1655 01/04/16 01/14/16 1002 05/09/16  WBC 7.0 7.4 9.2 5.6 5.0  NEUTROABS  --   --   --  3.1  --   HGB 9.1* 9.9* 11.1*  --  12.6*  HCT 28.7* 31.6* 35* 37.0* 38*  MCV 85.2 86.3  --  88  --   PLT 194 215 248 171 154   TSH: No results for input(s): TSH in the last 8760 hours. A1C: Lab Results  Component Value Date   HGBA1C 7.8 05/09/2016   Lipid Panel:  Recent Labs  02/01/16 05/09/16  CHOL 145 159  HDL 29* 33*  LDLCALC 65 72  TRIG 253* 269*     Assessment/Plan 1. Acute kidney injury ([place]) -has improved with stopping ACEi and metformin, cont to encouraged fluids and avoid NSAIDs  2. Type 2 diabetes mellitus with stage 3 chronic kidney disease, without long-term current use of insulin (HCC Metformin stopped. conts on lantus, tradjenta and glipidize. No hypoglycemic episodes. Will cont current regimen and monitor blood sugars at this time.  Will consider restart of ACEi for renal protection if Cr/BUN remains stable.  3. Essential hypertension -blood pressure stable off lisinopril. Will cont norvasc   William [name] K. Harle Battiest  [facility] [phone] 8 am - 5 pm) [phone] (after hours)

## 2016-06-26 ENCOUNTER — Encounter: Payer: Self-pay | Admitting: Gastroenterology

## 2016-06-26 ENCOUNTER — Encounter (INDEPENDENT_AMBULATORY_CARE_PROVIDER_SITE_OTHER): Payer: Self-pay

## 2016-06-26 ENCOUNTER — Ambulatory Visit (INDEPENDENT_AMBULATORY_CARE_PROVIDER_SITE_OTHER): Payer: Medicare Other | Admitting: Gastroenterology

## 2016-06-26 VITALS — BP 124/70 | HR 82 | Ht 67.0 in | Wt 216.0 lb

## 2016-06-26 DIAGNOSIS — Z1211 Encounter for screening for malignant neoplasm of colon: Secondary | ICD-10-CM

## 2016-06-26 DIAGNOSIS — N183 Chronic kidney disease, stage 3 (moderate): Secondary | ICD-10-CM | POA: Diagnosis not present

## 2016-06-26 NOTE — Progress Notes (Signed)
William [name]    [id number]    October 31, 1945  Primary Care Physician:Dorsel Linna Darner, MD  Referring Physician: Gildardo Cranker, DO [place], [place] [id number]  Chief complaint:  Screening for colorectal cancer  HPI:  70 year old male with multiple comorbidities diabetes, seizure disorder, alcohol abuse, dementia, hypertension [facility] here by PMD for evaluation and screening for colorectal cancer. He is accompanied by one of the nursing aides from the facility He denies any GI specific symptoms. No nausea, vomiting, abdominal pain, melena or bright red blood per rectum On ROS c/o pain in the suprapubic region/lower abdomen intermittently when he tries to urinate. He has had UTI in the past.    Outpatient Encounter Prescriptions as of 06/26/2016  Medication Sig  . acetaminophen (TYLENOL) 500 MG tablet Take 2 tablets by mouth every morning and every evening for pain.  [place] Kitchen amLODipine (NORVASC) 5 MG tablet   . aspirin (ASPIRIN EC) 81 MG EC tablet Take 81 mg by mouth daily. Swallow whole.  . ferrous sulfate 325 (65 FE) MG tablet Take 1 tablet (325 mg total) by mouth daily with breakfast.  . folic acid (FOLVITE) 1 MG tablet Take 1 tablet (1 mg total) by mouth daily.  [place] Kitchen glipiZIDE (GLUCOTROL) 10 MG tablet Take 10 mg by mouth 2 (two) times daily before a meal.  . insulin glargine (LANTUS) 100 UNIT/ML injection Inject 12 Units into the skin at bedtime.   [place] Kitchen LORazepam (ATIVAN) 0.5 MG tablet Take 0.25 mg by mouth 3 (three) times daily as needed for anxiety.  . pravastatin (PRAVACHOL) 10 MG tablet Take 10 mg by mouth at bedtime.   [place] Kitchen QUEtiapine (SEROQUEL) 100 MG tablet Take 100 mg by mouth at bedtime.  [place] Kitchen QUEtiapine (SEROQUEL) 50 MG tablet Take 50 mg by mouth every morning.  . sertraline (ZOLOFT) 50 MG tablet Take 50 mg by mouth daily.  [place] Kitchen thiamine (VITAMIN B-1) 100 MG tablet Take 100 mg by mouth daily.  . TRADJENTA 5 MG TABS  tablet Take 5 mg by mouth daily.   [place] Kitchen valproic acid (DEPAKENE) 250 MG/5ML syrup Take 10 mLs (500 mg total) by mouth 3 (three) times daily.   No facility-administered encounter medications on file as of 06/26/2016.     Allergies as of 06/26/2016  . (No Known Allergies)    Past Medical History:  Diagnosis Date  . Abnormality of gait 01/14/2016  . Alcohol abuse 2009   dependency as exhibited by ETOH withdrawal seizure.   . Alcohol withdrawal seizure ([place]) 2013  . Anemia 2011   Normocytic.   . Cataract   . Cataract   . Chronic renal insufficiency, stage III (moderate) 02/26/2014  . Depression with anxiety   . Diabetes mellitus type II   . Diabetes mellitus without complication ([place])   . Hearing impairment   . HTN (hypertension)   . HTN (hypertension), benign 02/26/2014  . Hyperlipemia   . Hypertension   . Seizures ([place])   . Type II or unspecified type diabetes mellitus with neurological manifestations, not stated as uncontrolled 02/26/2014    Past Surgical History:  Procedure Laterality Date  . NO PAST SURGERIES      Family History  Problem Relation Age of Onset  . Family history unknown: Yes    Social History   Social History  . Marital status: Single    Spouse name: N/A  . Number of children: 0  .  Years of education: N/A   Occupational History  . Not on file.   Social History Main Topics  . Smoking status: Former Smoker    Types: Cigars  . Smokeless tobacco: Current User    Types: Chew  . Alcohol use No     Comment: 04/2015 I have not drank in over a month "  . Drug use: No  . Sexual activity: Not Currently   Other Topics Concern  . Not on file   Social History Narrative   ** Merged History Encounter **[facility]             Review of systems: Review of Systems  Constitutional: Negative for fever and chills.  HENT: Negative.   Eyes: Negative for blurred vision.  Respiratory: Negative for cough, shortness of breath and wheezing.     Cardiovascular: Negative for chest pain and palpitations.  Gastrointestinal: as per HPI Genitourinary: Negative for dysuria, urgency, frequency and hematuria.  Musculoskeletal: Negative for myalgias, back pain and joint pain.  Skin: Negative for itching and rash.  Neurological: Positive for dizziness, tremors, and seizures  Endo/Heme/Allergies: Positive for seasonal allergies.  Psychiatric/Behavioral: Negative for depression, suicidal ideas and hallucinations.  All other systems reviewed and are negative.   Physical Exam: Vitals:   06/26/16 1010  BP: 124/70  Pulse: 82   Body mass index is 33.83 kg/m. Gen:      No acute distress, obese, wheel chair bound HEENT:  EOMI, sclera anicteric Neck:     No masses; no thyromegaly Lungs:    Clear to auscultation bilaterally; normal respiratory effort CV:         Regular rate and rhythm; no murmurs Abd:      + bowel sounds; soft, non-tender; no palpable masses, no distension Ext:    No edema; adequate peripheral perfusion Skin:      Warm and dry; no rash Neuro: alert and oriented x 3 Psych: normal mood and affect  Data Reviewed:  Reviewed labs, radiology imaging, old records and pertinent past GI work up   Assessment and Plan/Recommendations:  70 year old male with multiple comorbidities diabetes, seizure disorder, alcohol abuse, dementia, hypertension currently at [facility] referred here by PMD to discuss screening colonoscopy. Patient has no specific GI complaints Recommend Cologaurd for colorectal cancer screening Hold off endoscopic evaluation given his co morbities, higher risk with sedation and also procedural related complication. Also patient may have extreme difficulty with bowel prep given his mobility issues and dementia Follow up as needed   K. Denzil Magnuson , MD [phone] Mon-Fri 8a-5p [phone] after 5p, weekends, holidays  CC: Gildardo Cranker, DO

## 2016-06-26 NOTE — Patient Instructions (Signed)
We have sent your demographic and insurance information to [employer]. They should contact you within the next week regarding your Cologuard (colon cancer screening) test. If you have not heard from them within the next week, please call our office at [phone].

## 2016-06-27 DIAGNOSIS — Z23 Encounter for immunization: Secondary | ICD-10-CM | POA: Diagnosis not present

## 2016-06-27 LAB — BASIC METABOLIC PANEL
BUN: 21 mg/dL (ref 4–21)
CREATININE: 1.4 mg/dL — AB (ref 0.6–1.3)
GLUCOSE: 236 mg/dL
Potassium: 5.4 mmol/L — AB (ref 3.4–5.3)
Sodium: 133 mmol/L — AB (ref 137–147)

## 2016-07-02 DIAGNOSIS — Z1212 Encounter for screening for malignant neoplasm of rectum: Secondary | ICD-10-CM | POA: Diagnosis not present

## 2016-07-02 DIAGNOSIS — Z1211 Encounter for screening for malignant neoplasm of colon: Secondary | ICD-10-CM | POA: Diagnosis not present

## 2016-07-09 ENCOUNTER — Non-Acute Institutional Stay (SKILLED_NURSING_FACILITY): Payer: Medicare Other | Admitting: Nurse Practitioner

## 2016-07-09 ENCOUNTER — Encounter: Payer: Self-pay | Admitting: Nurse Practitioner

## 2016-07-09 DIAGNOSIS — N183 Chronic kidney disease, stage 3 unspecified: Secondary | ICD-10-CM

## 2016-07-09 DIAGNOSIS — F29 Unspecified psychosis not due to a substance or known physiological condition: Secondary | ICD-10-CM | POA: Diagnosis not present

## 2016-07-09 DIAGNOSIS — E785 Hyperlipidemia, unspecified: Secondary | ICD-10-CM | POA: Diagnosis not present

## 2016-07-09 DIAGNOSIS — W57XXXD Bitten or stung by nonvenomous insect and other nonvenomous arthropods, subsequent encounter: Secondary | ICD-10-CM | POA: Diagnosis not present

## 2016-07-09 DIAGNOSIS — I10 Essential (primary) hypertension: Secondary | ICD-10-CM

## 2016-07-09 DIAGNOSIS — E1122 Type 2 diabetes mellitus with diabetic chronic kidney disease: Secondary | ICD-10-CM | POA: Diagnosis not present

## 2016-07-09 NOTE — Progress Notes (Signed)
Nursing Home Location:  [facility]  Place of Service: SNF (31)  PCP: [name], MD  No Known Allergies  Chief Complaint  Patient presents with  . Medical Management of Chronic Issues    Routine Visit    HPI:  Patient is a 70 y.o. male seen today at [facility] for routine follow up. Pt with hx of ETOH abuse, DM, HTN, seizures, anemia, depression. In the last month pt has been seen due to abnormal labs, increase in BUN/CR, metformin was started due to uncontrolled diabetes. This was discontinued and BUN/Cr improved. lantus was increased when metformin was stopped for diabetes.   Blood sugars ranging from 208-306, highest blood sugar of 411. Pt was seen by GI for colorectal screening, plans for cologuard  Pt was recently moved rooms due to an outbreak of bedbugs. Several bits noted per staff and hydrocortisone cream to affected area PRN. Pt does not have any itching at this time.  Staff reports behaviors have overall improved  Doing well. No concerns from pt.   Review of Systems: by pt and staff Review of Systems  Constitutional: Negative for activity change, appetite change, chills, diaphoresis, fatigue, fever and unexpected weight change.  HENT: Negative for congestion.   Respiratory: Negative for cough and shortness of breath.   Cardiovascular: Negative for chest pain, palpitations and leg swelling.  Gastrointestinal: Negative for abdominal distention, constipation and diarrhea.  Genitourinary: Negative for difficulty urinating, dysuria and flank pain.  Musculoskeletal: Positive for gait problem. Negative for arthralgias and back pain.  Neurological: Negative for dizziness, seizures and facial asymmetry.  Psychiatric/Behavioral: Positive for agitation and behavioral problems. Negative for confusion.    Past Medical History:  Diagnosis Date  . Abnormality of gait 01/14/2016  . Alcohol abuse 2009   dependency as exhibited by ETOH withdrawal seizure.     . Alcohol withdrawal seizure ([place]) 2013  . Anemia 2011   Normocytic.   . Cataract   . Cataract   . Chronic renal insufficiency, stage III (moderate) 02/26/2014  . Depression with anxiety   . Diabetes mellitus type II   . Diabetes mellitus without complication ([place])   . Hearing impairment   . HTN (hypertension)   . HTN (hypertension), benign 02/26/2014  . Hyperlipemia   . Hypertension   . Seizures ([place])   . Type II or unspecified type diabetes mellitus with neurological manifestations, not stated as uncontrolled(250.60) 02/26/2014   Past Surgical History:  Procedure Laterality Date  . NO PAST SURGERIES     Social History:   reports that he has quit smoking. His smoking use included Cigars. His smokeless tobacco use includes Chew. He reports that he does not drink alcohol or use drugs.  Family History  Problem Relation Age of Onset  . Family history unknown: Yes    Medications: Patient's Medications  New Prescriptions   No medications on file  Previous Medications   ACETAMINOPHEN (TYLENOL) 500 MG TABLET    Take 2 tablets by mouth every morning and every evening for pain.   AMLODIPINE (NORVASC) 5 MG TABLET       ASPIRIN (ASPIRIN EC) 81 MG EC TABLET    Take 81 mg by mouth daily. Swallow whole.   FERROUS SULFATE 325 (65 FE) MG TABLET    Take 1 tablet (325 mg total) by mouth daily with breakfast.   FOLIC ACID (FOLVITE) 1 MG TABLET    Take 1 tablet (1 mg total) by mouth daily.   GLIPIZIDE (GLUCOTROL)  10 MG TABLET    Take 10 mg by mouth 2 (two) times daily before a meal.   INSULIN GLARGINE (LANTUS) 100 UNIT/ML INJECTION    Inject 12 Units into the skin at bedtime.    LORAZEPAM (ATIVAN) 0.5 MG TABLET    Take 0.25 mg by mouth 3 (three) times daily as needed for anxiety.   PRAVASTATIN (PRAVACHOL) 10 MG TABLET    Take 10 mg by mouth at bedtime.    QUETIAPINE (SEROQUEL) 100 MG TABLET    Take 100 mg by mouth at bedtime.   QUETIAPINE (SEROQUEL) 50 MG TABLET    Take 50 mg by mouth every  morning.   SERTRALINE (ZOLOFT) 50 MG TABLET    Take 50 mg by mouth daily.   THIAMINE (VITAMIN B-1) 100 MG TABLET    Take 100 mg by mouth daily.   TRADJENTA 5 MG TABS TABLET    Take 5 mg by mouth daily.    VALPROIC ACID (DEPAKENE) 250 MG/5ML SYRUP    Take 10 mLs (500 mg total) by mouth 3 (three) times daily.  Modified Medications   No medications on file  Discontinued Medications   No medications on file     Physical Exam: Vitals:   07/09/16 1344  BP: 133/74  Pulse: 67  Resp: 20  Temp: 97.3 F (36.3 C)  Weight: 215 lb (97.5 kg)  Height: 5\' 7"  (1.702 m)    Physical Exam  Constitutional: No distress.  Smells of urine  HENT:  Head: Normocephalic and atraumatic.  Nose: Nose normal.  Mouth/Throat: Oropharynx is clear and moist. No oropharyngeal exudate.  Edentulous. Very HOH Large amount of smokeless tobacco in mouth  Eyes: Conjunctivae and EOM are normal. Pupils are equal, round, and reactive to light. Right eye exhibits no discharge. Left eye exhibits no discharge.  Neck: Normal range of motion. Neck supple.  Cardiovascular: Normal rate and regular rhythm.   No murmur heard. BLE edema +1  Pulmonary/Chest: Effort normal and breath sounds normal.  Abdominal: Soft. Bowel sounds are normal.  Genitourinary:  Genitourinary Comments: incontinent of urine  Musculoskeletal: He exhibits no edema.  Neurological: He is alert.  Skin: Skin is warm and dry. He is not diaphoretic.  Several areas of excoriation on right upper arms and bilateral LE from scratching. No signs of infection.   Psychiatric: He has a normal mood and affect.    Labs reviewed: Basic Metabolic Panel:  Recent Labs  11/21/15 0811 12/29/15 2240 12/30/15 1252  01/14/16 1002  05/09/16 06/10/16 06/17/16  NA 139 135  --   < > 138  < > 134* 134* 135*  K 4.3 4.2  --   < > 5.6*  < > 4.6 5.3 5.5*  CL 101 102  --   --  100  --   --   --   --   CO2 26 20*  --   --  25  --   --   --   --   GLUCOSE 150* 209*  --   --   196*  --   --   --   --   BUN 17 10  --   < > 20  < > 22* 32* 22*  CREATININE 1.53* 1.55*  --   < > 1.41*  < > 1.4* 2.3* 1.5*  CALCIUM 9.4 8.6*  --   --  9.8  --   --   --   --   MG  --   --  1.7  --   --   --   --   --   --   < > = values in this interval not displayed. Liver Function Tests:  Recent Labs  11/19/15 1945 12/30/15 1252 01/04/16 01/14/16 1002  AST 23 22 12* 12  ALT 10* 11* 10 15  ALKPHOS 73 57 54 60  BILITOT 0.4 0.7  --  0.2  PROT 7.4 6.4*  --  6.3  ALBUMIN 4.2 3.5  --  4.0   No results for input(s): LIPASE, AMYLASE in the last 8760 hours.  Recent Labs  01/14/16 1002  AMMONIA 42   CBC:  Recent Labs  12/29/15 2240 12/30/15 1655 01/04/16 01/14/16 1002 05/09/16  WBC 7.0 7.4 9.2 5.6 5.0  NEUTROABS  --   --   --  3.1  --   HGB 9.1* 9.9* 11.1*  --  12.6*  HCT 28.7* 31.6* 35* 37.0* 38*  MCV 85.2 86.3  --  88  --   PLT 194 215 248 171 154   TSH: No results for input(s): TSH in the last 8760 hours. A1C: Lab Results  Component Value Date   HGBA1C 7.8 05/09/2016   Lipid Panel:  Recent Labs  02/01/16 05/09/16  CHOL 145 159  HDL 29* 33*  LDLCALC 65 72  TRIG 253* 269*     Assessment/Plan 1. Type 2 diabetes mellitus with stage 3 chronic kidney disease, without long-term current use of insulin (HCC) Blood sugars remain elevated. Pt currently getting lantus 12 units qhs, will increase to 15 units and start novolog 5 units AC due to blood sugars in the 300 range with lunch and dinner  2. Chronic renal insufficiency, stage III (moderate) Cr 1.5 in September. Staff to encourage proper hydration and to avoid nephrotoxic medication  3. Essential hypertension Blood pressure stable, on norvasc   4. Hyperlipidemia, unspecified hyperlipidemia type LDL 72, conts on pravastatin 10 mg daily.   5. Psychosis, unspecified psychosis type Improved behaviors on Seroquel and Depakote TID, will cont current medications   6. Bug bite, subsequent encounter Bed bugs  noted in residents room, since has been treated and moved rooms. No signs of infection noted.  Denies itching at this time. Cont hydrocortisone cream PRN itch.    Carlos American. Harle Battiest  [facility] [phone] 8 am - 5 pm) [phone] (after hours)

## 2016-07-10 LAB — HM DIABETES EYE EXAM

## 2016-07-16 ENCOUNTER — Non-Acute Institutional Stay (SKILLED_NURSING_FACILITY): Payer: Medicare Other | Admitting: Nurse Practitioner

## 2016-07-16 ENCOUNTER — Encounter: Payer: Self-pay | Admitting: Nurse Practitioner

## 2016-07-16 DIAGNOSIS — L299 Pruritus, unspecified: Secondary | ICD-10-CM | POA: Diagnosis not present

## 2016-07-16 DIAGNOSIS — Z794 Long term (current) use of insulin: Secondary | ICD-10-CM | POA: Diagnosis not present

## 2016-07-16 DIAGNOSIS — W57XXXD Bitten or stung by nonvenomous insect and other nonvenomous arthropods, subsequent encounter: Secondary | ICD-10-CM

## 2016-07-16 DIAGNOSIS — Z961 Presence of intraocular lens: Secondary | ICD-10-CM | POA: Diagnosis not present

## 2016-07-16 DIAGNOSIS — H04123 Dry eye syndrome of bilateral lacrimal glands: Secondary | ICD-10-CM | POA: Diagnosis not present

## 2016-07-16 DIAGNOSIS — E119 Type 2 diabetes mellitus without complications: Secondary | ICD-10-CM | POA: Diagnosis not present

## 2016-07-16 NOTE — Progress Notes (Addendum)
Nursing Home Location:  [facility]  Place of Service: SNF (31)  PCP: [name], MD  No Known Allergies  Chief Complaint  Patient presents with  . Acute Visit    Rash    HPI:  Patient is a 70 y.o. male seen today at [facility] at the request of nursing due to rash. Pt with hx of ETOH abuse, DM, HTN, seizures, anemia, depression. Pt has had previous exposure to bed bugs and has had sores on legs where he has been itching and causing open areas. Pt denies rash but reports very itchy skin all over. Staff has been using hydrocortisone cream without good effects   Review of Systems: by pt and staff Review of Systems  Constitutional: Negative for activity change, appetite change, chills, diaphoresis, fatigue and fever.  Skin: Negative for rash.       Itchy skin throughout     Past Medical History:  Diagnosis Date  . Abnormality of gait 01/14/2016  . Alcohol abuse 2009   dependency as exhibited by ETOH withdrawal seizure.   . Alcohol withdrawal seizure ([place]) 2013  . Anemia 2011   Normocytic.   . Cataract   . Cataract   . Chronic renal insufficiency, stage III (moderate) 02/26/2014  . Depression with anxiety   . Diabetes mellitus type II   . Diabetes mellitus without complication ([place])   . Hearing impairment   . HTN (hypertension)   . HTN (hypertension), benign 02/26/2014  . Hyperlipemia   . Hypertension   . Seizures ([place])   . Type II or unspecified type diabetes mellitus with neurological manifestations, not stated as uncontrolled(250.60) 02/26/2014   Past Surgical History:  Procedure Laterality Date  . NO PAST SURGERIES     Social History:   reports that he has quit smoking. His smoking use included Cigars. His smokeless tobacco use includes Chew. He reports that he does not drink alcohol or use drugs.  Family History  Problem Relation Age of Onset  . Family history unknown: Yes    Medications: Patient's Medications  New Prescriptions   No  medications on file  Previous Medications   ACETAMINOPHEN (TYLENOL) 500 MG TABLET    Take 2 tablets by mouth every morning and every evening for pain.   AMLODIPINE (NORVASC) 5 MG TABLET       ASPIRIN (ASPIRIN EC) 81 MG EC TABLET    Take 81 mg by mouth daily. Swallow whole.   FERROUS SULFATE 325 (65 FE) MG TABLET    Take 1 tablet (325 mg total) by mouth daily with breakfast.   FOLIC ACID (FOLVITE) 1 MG TABLET    Take 1 tablet (1 mg total) by mouth daily.   GLIPIZIDE (GLUCOTROL) 10 MG TABLET    Take 10 mg by mouth 2 (two) times daily before a meal.   HYDROCORTISONE CREAM 0.5 %    Apply 1 application topically 2 (two) times daily.   INSULIN ASPART (NOVOLOG) 100 UNIT/ML INJECTION    Give 5 units SQ with meals for blood sugars over 150   INSULIN GLARGINE (LANTUS) 100 UNIT/ML INJECTION    Inject 15 Units into the skin at bedtime.    LORAZEPAM (ATIVAN) 0.5 MG TABLET    Take 0.25 mg by mouth 3 (three) times daily as needed for anxiety.   PRAVASTATIN (PRAVACHOL) 10 MG TABLET    Take 10 mg by mouth at bedtime.    QUETIAPINE (SEROQUEL) 100 MG TABLET    Take 100 mg  by mouth at bedtime.   QUETIAPINE (SEROQUEL) 50 MG TABLET    Take 50 mg by mouth every morning.   SERTRALINE (ZOLOFT) 50 MG TABLET    Take 50 mg by mouth daily.   THIAMINE (VITAMIN B-1) 100 MG TABLET    Take 100 mg by mouth daily.   TRADJENTA 5 MG TABS TABLET    Take 5 mg by mouth daily.    VALPROIC ACID (DEPAKENE) 250 MG/5ML SYRUP    Take 10 mLs (500 mg total) by mouth 3 (three) times daily.  Modified Medications   No medications on file  Discontinued Medications   No medications on file     Physical Exam: Vitals:   07/16/16 1328  BP: 130/72  Pulse: 67  Resp: (!) 22  Temp: 97.3 F (36.3 C)  Weight: 215 lb (97.5 kg)  Height: 5\' 7"  (1.702 m)    Physical Exam  Constitutional: He appears well-developed and well-nourished.  Skin: Skin is warm and dry. Rash noted. No erythema.  Small scattered Sores noted to bilateral legs and  arms, no erythema or drainage.   Psychiatric: He has a normal mood and affect.    Labs reviewed: Basic Metabolic Panel:  Recent Labs  11/21/15 0811 12/29/15 2240 12/30/15 1252  01/14/16 1002  05/09/16 06/10/16 06/17/16  NA 139 135  --   < > 138  < > 134* 134* 135*  K 4.3 4.2  --   < > 5.6*  < > 4.6 5.3 5.5*  CL 101 102  --   --  100  --   --   --   --   CO2 26 20*  --   --  25  --   --   --   --   GLUCOSE 150* 209*  --   --  196*  --   --   --   --   BUN 17 10  --   < > 20  < > 22* 32* 22*  CREATININE 1.53* 1.55*  --   < > 1.41*  < > 1.4* 2.3* 1.5*  CALCIUM 9.4 8.6*  --   --  9.8  --   --   --   --   MG  --   --  1.7  --   --   --   --   --   --   < > = values in this interval not displayed. Liver Function Tests:  Recent Labs  11/19/15 1945 12/30/15 1252 01/04/16 01/14/16 1002  AST 23 22 12* 12  ALT 10* 11* 10 15  ALKPHOS 73 57 54 60  BILITOT 0.4 0.7  --  0.2  PROT 7.4 6.4*  --  6.3  ALBUMIN 4.2 3.5  --  4.0   No results for input(s): LIPASE, AMYLASE in the last 8760 hours.  Recent Labs  01/14/16 1002  AMMONIA 42   CBC:  Recent Labs  12/29/15 2240 12/30/15 1655 01/04/16 01/14/16 1002 05/09/16  WBC 7.0 7.4 9.2 5.6 5.0  NEUTROABS  --   --   --  3.1  --   HGB 9.1* 9.9* 11.1*  --  12.6*  HCT 28.7* 31.6* 35* 37.0* 38*  MCV 85.2 86.3  --  88  --   PLT 194 215 248 171 154   TSH: No results for input(s): TSH in the last 8760 hours. A1C: Lab Results  Component Value Date   HGBA1C 7.8 05/09/2016   Lipid Panel:  Recent Labs  02/01/16 05/09/16  CHOL 145 159  HDL 29* 33*  LDLCALC 65 72  TRIG 253* 269*     Assessment/Plan 1. Bug bite, subsequent encounter Causing pruritis  roommate brought bedbugs into room and pt with increase itching, there is no active rash Pt using hydrocortisone cream PRN May have vistaril 25 mg q 6 hour PRN severe itch  Ludy Messamore K. Harle Battiest  [facility] [phone] 8 am - 5  pm) [phone] (after hours)

## 2016-07-17 ENCOUNTER — Other Ambulatory Visit: Payer: Self-pay

## 2016-07-17 LAB — COLOGUARD: COLOGUARD: NEGATIVE

## 2016-07-21 LAB — BASIC METABOLIC PANEL: Glucose: 413 mg/dL

## 2016-07-24 LAB — BASIC METABOLIC PANEL: GLUCOSE: 400 mg/dL

## 2016-07-29 LAB — BASIC METABOLIC PANEL
Glucose: 402 mg/dL
Glucose: 405 mg/dL
Glucose: 411 mg/dL

## 2016-07-31 ENCOUNTER — Non-Acute Institutional Stay (SKILLED_NURSING_FACILITY): Payer: Medicare Other | Admitting: Internal Medicine

## 2016-07-31 ENCOUNTER — Encounter: Payer: Self-pay | Admitting: Internal Medicine

## 2016-07-31 DIAGNOSIS — I10 Essential (primary) hypertension: Secondary | ICD-10-CM | POA: Diagnosis not present

## 2016-07-31 DIAGNOSIS — Z794 Long term (current) use of insulin: Secondary | ICD-10-CM

## 2016-07-31 DIAGNOSIS — E0822 Diabetes mellitus due to underlying condition with diabetic chronic kidney disease: Secondary | ICD-10-CM | POA: Diagnosis not present

## 2016-07-31 DIAGNOSIS — R8290 Unspecified abnormal findings in urine: Secondary | ICD-10-CM | POA: Diagnosis not present

## 2016-07-31 DIAGNOSIS — N182 Chronic kidney disease, stage 2 (mild): Secondary | ICD-10-CM | POA: Diagnosis not present

## 2016-07-31 DIAGNOSIS — IMO0002 Reserved for concepts with insufficient information to code with codable children: Secondary | ICD-10-CM

## 2016-07-31 DIAGNOSIS — E0865 Diabetes mellitus due to underlying condition with hyperglycemia: Secondary | ICD-10-CM

## 2016-07-31 DIAGNOSIS — E0829 Diabetes mellitus due to underlying condition with other diabetic kidney complication: Secondary | ICD-10-CM | POA: Insufficient documentation

## 2016-07-31 NOTE — Assessment & Plan Note (Signed)
BP controlled; no change in antihypertensive medications  

## 2016-07-31 NOTE — Progress Notes (Signed)
    [facility]: [address]  Dr. [name] [address] [place] [address]  Chief Complaint  Patient presents with  . Acute Visit    Uncontrolled Blood Sugars   No Known Allergies  This is a nursing facility follow up for specific acute issue of uncontrolled DM.  Interim medical record and care since last [place] visit was updated with review of diagnostic studies and change in clinical status since last visit were documented.  SHF:WYOV the last week fasting blood sugars have been in the range of 400-413 despite Glipizide sulfonylurea,  at 10 mg twice a day, Tradjenta 5 mg daily, Lantus 15 units at bedtime and 5 units regular insulin before meals for glucoses over 150. He is not on metformin,creatinine has been as high as 2.3. The most recent creatinine was 1.5 on 9/19.  Review of systems: His major concern is pruritus from scabies for which he's been treated. He describes intermittent incontinence but no other symptoms of uncontrolled diabetes. Constitutional: No fever,significant weight change, fatigue  Eyes: No redness, discharge, pain, vision change ENT/mouth: No nasal congestion,  purulent discharge, earache,change in hearing ,sore throat  Cardiovascular: No chest pain, palpitations,paroxysmal nocturnal dyspnea, claudication, edema  Respiratory: No cough, sputum production Gastrointestinal: No heartburn,dysphagia,abdominal pain, nausea / vomiting,rectal bleeding, melena,change in bowels except stool urgency following a laxative. Genitourinary: No dysuria,hematuria, pyuria Musculoskeletal: No joint stiffness, joint swelling, weakness,pain Dermatologic: No rash, pruritus, change in appearance of skin Neurologic: No  numbness , tingling Endocrine: No change in hair/skin/ nails, excessive thirst, excessive hunger, excessive urination   Physical exam:  Pertinent or positive findings: He is disheveled and unshaven. There is a strong uriniferous odor in  the room. He is edentulous. He has a large chaw of tobacco in his mouth. Also tobacco spittle is on the floor around the bed. Heart sounds are slightly distant ; he has some respiratory variation to the heart rate. Abdomen is massive. He has excoriations over the legs w/o obvious cellulitis or purulence formation associated with these. Pulses are decreased. There are  fusiform changes of the knees. 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 He has minimal degenerative joint changes in the hands.  General appearance:Adequately nourished; no acute distress , increased work of breathing is present.   Lymphatic: No lymphadenopathy about the head, neck, axilla . Eyes: No conjunctival inflammation or lid edema is present. There is no scleral icterus. Ears:  External ear exam shows no significant lesions or deformities.   Nose:  External nasal examination shows no deformity or inflammation. Nasal mucosa are pink and moist without lesions ,exudates Oral exam: lips and gums are healthy appearing.No intraoral lesions seen Neck:  No thyromegaly, masses, tenderness noted.    Lungs:Chest clear to auscultation without wheezes, rhonchi,rales , rubs. Abdomen:Bowel sounds are normal. Abdomen is tense but nontender with no organomegaly, hernias,masses. GU: deferred , wearing diaper  Extremities:  No cyanosis, clubbing,edema  Skin: Warm & dry w/o tenting.     See summary under each active problem in the Problem List with associated updated therapeutic plan

## 2016-07-31 NOTE — Patient Instructions (Signed)
See Current Assessment & Plan in Problem List under specific Diagnosis 

## 2016-07-31 NOTE — Assessment & Plan Note (Addendum)
Increase Lantus Discontinue glipizide A1c BMET, consider metformin low-dose if renal function is stable

## 2016-08-01 DIAGNOSIS — R1312 Dysphagia, oropharyngeal phase: Secondary | ICD-10-CM | POA: Diagnosis not present

## 2016-08-01 DIAGNOSIS — N183 Chronic kidney disease, stage 3 (moderate): Secondary | ICD-10-CM | POA: Diagnosis not present

## 2016-08-01 DIAGNOSIS — R488 Other symbolic dysfunctions: Secondary | ICD-10-CM | POA: Diagnosis not present

## 2016-08-01 DIAGNOSIS — E119 Type 2 diabetes mellitus without complications: Secondary | ICD-10-CM | POA: Diagnosis not present

## 2016-08-01 DIAGNOSIS — Z87440 Personal history of urinary (tract) infections: Secondary | ICD-10-CM | POA: Diagnosis not present

## 2016-08-02 LAB — HEMOGLOBIN A1C: HEMOGLOBIN A1C: 7.8

## 2016-08-02 LAB — BASIC METABOLIC PANEL WITH GFR
BUN: 26 mg/dL — AB (ref 4–21)
Creatinine: 1.5 mg/dL — AB (ref 0.6–1.3)
Glucose: 278 mg/dL
Potassium: 4.9 mmol/L (ref 3.4–5.3)
Sodium: 135 mmol/L — AB (ref 137–147)

## 2016-08-15 ENCOUNTER — Encounter: Payer: Self-pay | Admitting: Nurse Practitioner

## 2016-08-15 ENCOUNTER — Non-Acute Institutional Stay (SKILLED_NURSING_FACILITY): Payer: Medicare Other | Admitting: Nurse Practitioner

## 2016-08-15 DIAGNOSIS — F1011 Alcohol abuse, in remission: Secondary | ICD-10-CM

## 2016-08-15 DIAGNOSIS — I10 Essential (primary) hypertension: Secondary | ICD-10-CM | POA: Diagnosis not present

## 2016-08-15 DIAGNOSIS — Z794 Long term (current) use of insulin: Secondary | ICD-10-CM | POA: Diagnosis not present

## 2016-08-15 DIAGNOSIS — L299 Pruritus, unspecified: Secondary | ICD-10-CM | POA: Diagnosis not present

## 2016-08-15 DIAGNOSIS — N183 Chronic kidney disease, stage 3 unspecified: Secondary | ICD-10-CM

## 2016-08-15 DIAGNOSIS — N182 Chronic kidney disease, stage 2 (mild): Secondary | ICD-10-CM

## 2016-08-15 DIAGNOSIS — E0865 Diabetes mellitus due to underlying condition with hyperglycemia: Secondary | ICD-10-CM

## 2016-08-15 DIAGNOSIS — Z87898 Personal history of other specified conditions: Secondary | ICD-10-CM | POA: Diagnosis not present

## 2016-08-15 DIAGNOSIS — E0822 Diabetes mellitus due to underlying condition with diabetic chronic kidney disease: Secondary | ICD-10-CM | POA: Diagnosis not present

## 2016-08-15 DIAGNOSIS — R569 Unspecified convulsions: Secondary | ICD-10-CM | POA: Diagnosis not present

## 2016-08-15 DIAGNOSIS — IMO0002 Reserved for concepts with insufficient information to code with codable children: Secondary | ICD-10-CM

## 2016-08-15 NOTE — Progress Notes (Signed)
Nursing Home Location:  [facility]  Place of Service: SNF (31)  PCP: [name], MD  No Known Allergies  Chief Complaint  Patient presents with  . Medical Management of Chronic Issues    Routine Visit    HPI:  Patient is a 70 y.o. male seen today at [facility] for routine follow up. Pt with hx of ETOH abuse, DM, HTN, seizures, anemia, depression. Pt with uncontrolled diabetes, noncompliant with diet. lantus has recently been increase to 35 units and mealtime coverage increased to 10 units. Pt still complains of itching however excoriated areas have improved due to bed bug bites.   Review of Systems: by pt and staff Review of Systems  Constitutional: Negative for activity change, appetite change, chills, diaphoresis, fatigue, fever and unexpected weight change.  HENT: Negative for congestion.   Respiratory: Negative for cough and shortness of breath.   Cardiovascular: Negative for chest pain, palpitations and leg swelling.  Gastrointestinal: Negative for abdominal distention, constipation and diarrhea.  Genitourinary: Negative for difficulty urinating, dysuria and flank pain.  Musculoskeletal: Negative for arthralgias and back pain.  Skin:       Pruritis   Neurological: Negative for dizziness, seizures and facial asymmetry.  Psychiatric/Behavioral: Positive for agitation (at times) and behavioral problems. Negative for confusion.    Past Medical History:  Diagnosis Date  . Abnormality of gait 01/14/2016  . Alcohol abuse 2009   dependency as exhibited by ETOH withdrawal seizure.   . Alcohol withdrawal seizure ([place]) 2013  . Anemia 2011   Normocytic.   . Cataract   . Chronic renal insufficiency, stage III (moderate) 02/26/2014  . Depression with anxiety   . Hearing impairment   . HTN (hypertension), benign 02/26/2014  . Hyperlipemia   . Hypertension   . Seizures ([place])   . Type II or unspecified type diabetes mellitus with neurological manifestations,  not stated as uncontrolled(250.60) 02/26/2014   Past Surgical History:  Procedure Laterality Date  . NO PAST SURGERIES     Social History:   reports that he has quit smoking. His smoking use included Cigars. His smokeless tobacco use includes Chew. He reports that he does not drink alcohol or use drugs.  Family History  Problem Relation Age of Onset  . Family history unknown: Yes    Medications: Patient's Medications  New Prescriptions   No medications on file  Previous Medications   ACETAMINOPHEN (TYLENOL) 500 MG TABLET    Take 2 tablets by mouth every morning and every evening for pain.   AMLODIPINE (NORVASC) 5 MG TABLET    Take 5 mg by mouth daily.    ASPIRIN (ASPIRIN EC) 81 MG EC TABLET    Take 81 mg by mouth daily. Swallow whole.   FERROUS SULFATE 325 (65 FE) MG TABLET    Take 1 tablet (325 mg total) by mouth daily with breakfast.   FOLIC ACID (FOLVITE) 1 MG TABLET    Take 1 tablet (1 mg total) by mouth daily.   GLIPIZIDE (GLUCOTROL) 10 MG TABLET    Take 10 mg by mouth 2 (two) times daily before a meal.   HYDROCORTISONE CREAM 0.5 %    Apply 1 application topically 2 (two) times daily.   HYDROXYZINE (ATARAX/VISTARIL) 25 MG TABLET    Take 25 mg by mouth every 6 (six) hours as needed.   INSULIN ASPART (NOVOLOG) 100 UNIT/ML INJECTION    10 Units. Give 5 units SQ with meals for blood sugars over 150  INSULIN GLARGINE (LANTUS) 100 UNIT/ML INJECTION    Inject 35 Units into the skin at bedtime.    LORAZEPAM (ATIVAN) 0.5 MG TABLET    Take 0.25 mg by mouth 3 (three) times daily as needed for anxiety.   PRAVASTATIN (PRAVACHOL) 10 MG TABLET    Take 10 mg by mouth at bedtime.    PROPYLENE GLYCOL (SYSTANE BALANCE OP)    One drop in each eye daily for dry eye   QUETIAPINE (SEROQUEL) 100 MG TABLET    Take 100 mg by mouth at bedtime.   QUETIAPINE (SEROQUEL) 50 MG TABLET    Take 50 mg by mouth every morning.   SERTRALINE (ZOLOFT) 50 MG TABLET    Take 50 mg by mouth daily.   THIAMINE (VITAMIN  B-1) 100 MG TABLET    Take 100 mg by mouth daily.   TRADJENTA 5 MG TABS TABLET    Take 5 mg by mouth daily.    VALPROIC ACID (DEPAKENE) 250 MG/5ML SYRUP    Take 10 mLs (500 mg total) by mouth 3 (three) times daily.  Modified Medications   No medications on file  Discontinued Medications   No medications on file     Physical Exam: Vitals:   08/15/16 1436  BP: 118/69  Pulse: 72  Resp: 20  Temp: 97.3 F (36.3 C)  SpO2: 95%  Weight: 216 lb (98 kg)  Height: 5\' 7"  (1.702 m)    Physical Exam  Constitutional: No distress.  Smells of urine  HENT:  Head: Normocephalic and atraumatic.  Nose: Nose normal.  Mouth/Throat: Oropharynx is clear and moist. No oropharyngeal exudate.  Edentulous. Very HOH Large amount of smokeless tobacco in mouth  Eyes: Conjunctivae and EOM are normal. Pupils are equal, round, and reactive to light. Right eye exhibits no discharge. Left eye exhibits no discharge.  Neck: Normal range of motion. Neck supple.  Cardiovascular: Normal rate and regular rhythm.   No murmur heard. BLE edema +1  Pulmonary/Chest: Effort normal and breath sounds normal.  Abdominal: Soft. Bowel sounds are normal.  Genitourinary:  Genitourinary Comments: incontinent of urine  Musculoskeletal: He exhibits no edema.  Neurological: He is alert.  Skin: Skin is warm and dry. He is not diaphoretic.  Few areas of excoriation to extremities.   Psychiatric: He has a normal mood and affect.    Labs reviewed: Basic Metabolic Panel:  Recent Labs  11/21/15 0811 12/29/15 2240 12/30/15 1252  01/14/16 1002  06/17/16 06/27/16 08/02/16  NA 139 135  --   < > 138  < > 135* 133* 135*  K 4.3 4.2  --   < > 5.6*  < > 5.5* 5.4* 4.9  CL 101 102  --   --  100  --   --   --   --   CO2 26 20*  --   --  25  --   --   --   --   GLUCOSE 150* 209*  --   --  196*  --   --   --   --   BUN 17 10  --   < > 20  < > 22* 21 26*  CREATININE 1.53* 1.55*  --   < > 1.41*  < > 1.5* 1.4* 1.5*  CALCIUM 9.4 8.6*   --   --  9.8  --   --   --   --   MG  --   --  1.7  --   --   --   --   --   --   < > =  values in this interval not displayed. Liver Function Tests:  Recent Labs  11/19/15 1945 12/30/15 1252 01/04/16 01/14/16 1002  AST 23 22 12* 12  ALT 10* 11* 10 15  ALKPHOS 73 57 54 60  BILITOT 0.4 0.7  --  0.2  PROT 7.4 6.4*  --  6.3  ALBUMIN 4.2 3.5  --  4.0   No results for input(s): LIPASE, AMYLASE in the last 8760 hours.  Recent Labs  01/14/16 1002  AMMONIA 42   CBC:  Recent Labs  12/29/15 2240 12/30/15 1655 01/04/16 01/14/16 1002 05/09/16  WBC 7.0 7.4 9.2 5.6 5.0  NEUTROABS  --   --   --  3.1  --   HGB 9.1* 9.9* 11.1*  --  12.6*  HCT 28.7* 31.6* 35* 37.0* 38*  MCV 85.2 86.3  --  88  --   PLT 194 215 248 171 154   TSH: No results for input(s): TSH in the last 8760 hours. A1C: Lab Results  Component Value Date   HGBA1C 7.8 08/02/2016   Lipid Panel:  Recent Labs  02/01/16 05/09/16  CHOL 145 159  HDL 29* 33*  LDLCALC 65 72  TRIG 253* 269*     Assessment/Plan 1. Diabetes mellitus due to underlying condition, uncontrolled, with stage 2 chronic kidney disease, with long-term current use of insulin (HCC) Recent adjustments in insulin, will cont current regimen and adjust medication as needed for better control.   2. Essential hypertension Blood pressure stable, cont current regimen   3. Pruritus Has improved, conts on vistaril PRN  4. History of alcohol abuse conts on thiamine and folic acid supplement   5. Chronic renal insufficiency, stage III (moderate) BUN/Cr stable at this time. To avoid dehydration and nephrotoxic medication.   6. Seizure ([place]) Stable, without recent seizures, conts on valproic acid  Ernesha Ramone K. Harle Battiest  [facility] [phone] 8 am - 5 pm) [phone] (after hours)

## 2016-09-08 ENCOUNTER — Encounter: Payer: Self-pay | Admitting: Internal Medicine

## 2016-09-10 ENCOUNTER — Non-Acute Institutional Stay (SKILLED_NURSING_FACILITY): Payer: Medicare Other | Admitting: Nurse Practitioner

## 2016-09-10 ENCOUNTER — Encounter: Payer: Self-pay | Admitting: Nurse Practitioner

## 2016-09-10 DIAGNOSIS — N183 Chronic kidney disease, stage 3 unspecified: Secondary | ICD-10-CM

## 2016-09-10 DIAGNOSIS — F29 Unspecified psychosis not due to a substance or known physiological condition: Secondary | ICD-10-CM

## 2016-09-10 DIAGNOSIS — IMO0002 Reserved for concepts with insufficient information to code with codable children: Secondary | ICD-10-CM

## 2016-09-10 DIAGNOSIS — E0865 Diabetes mellitus due to underlying condition with hyperglycemia: Secondary | ICD-10-CM

## 2016-09-10 DIAGNOSIS — N182 Chronic kidney disease, stage 2 (mild): Secondary | ICD-10-CM | POA: Diagnosis not present

## 2016-09-10 DIAGNOSIS — Z794 Long term (current) use of insulin: Secondary | ICD-10-CM | POA: Diagnosis not present

## 2016-09-10 DIAGNOSIS — R569 Unspecified convulsions: Secondary | ICD-10-CM

## 2016-09-10 DIAGNOSIS — I10 Essential (primary) hypertension: Secondary | ICD-10-CM

## 2016-09-10 DIAGNOSIS — E0822 Diabetes mellitus due to underlying condition with diabetic chronic kidney disease: Secondary | ICD-10-CM

## 2016-09-10 NOTE — Progress Notes (Signed)
Nursing Home Location:  [facility]  Place of Service: SNF (31)  PCP: [name], MD  No Known Allergies  Chief Complaint  Patient presents with  . Medical Management of Chronic Issues    Routine Visit    HPI:  Patient is a 70 y.o. male seen today at [facility] for routine follow up. Pt with hx of ETOH abuse, DM, HTN, seizures, anemia, depression. Pt with uncontrolled diabetes, noncompliant with diet. Recently due to insurance coverage medications were adjusted and trajdenta was stopped. Glipizide was also decreased to 5 mg daily. Blood sugars have been maintaining around 150s. Cont on lantus 35 units which has been titrated up.  No changes in mood or behaviors.  Review of Systems: by pt and staff Review of Systems  Constitutional: Negative for activity change, appetite change, chills, diaphoresis, fatigue, fever and unexpected weight change.  HENT: Negative for congestion.   Respiratory: Negative for cough and shortness of breath.   Cardiovascular: Negative for chest pain, palpitations and leg swelling.  Gastrointestinal: Negative for abdominal distention, constipation and diarrhea.  Genitourinary: Negative for difficulty urinating, dysuria and flank pain.  Musculoskeletal: Negative for arthralgias and back pain.  Skin:       Pruritis   Neurological: Negative for dizziness, seizures and facial asymmetry.  Psychiatric/Behavioral: Positive for agitation (at times) and behavioral problems. Negative for confusion.    Past Medical History:  Diagnosis Date  . Abnormality of gait 01/14/2016  . Alcohol abuse 2009   dependency as exhibited by ETOH withdrawal seizure.   . Alcohol withdrawal seizure ([name]) 2013  . Anemia 2011   Normocytic.   . Cataract   . Chronic renal insufficiency, stage III (moderate) 02/26/2014  . Depression with anxiety   . Hearing impairment   . HTN (hypertension), benign 02/26/2014  . Hyperlipemia   . Hypertension   . Seizures ([place])     . Type II or unspecified type diabetes mellitus with neurological manifestations, not stated as uncontrolled(250.60) 02/26/2014   Past Surgical History:  Procedure Laterality Date  . NO PAST SURGERIES     Social History:   reports that he has quit smoking. His smoking use included Cigars. His smokeless tobacco use includes Chew. He reports that he does not drink alcohol or use drugs.  Family History  Problem Relation Age of Onset  . Family history unknown: Yes    Medications: Patient's Medications  New Prescriptions   No medications on file  Previous Medications   ACETAMINOPHEN (TYLENOL) 500 MG TABLET    Take 2 tablets by mouth every morning and every evening for pain.   AMLODIPINE (NORVASC) 5 MG TABLET    Take 5 mg by mouth daily.    ASPIRIN (ASPIRIN EC) 81 MG EC TABLET    Take 81 mg by mouth daily. Swallow whole.   FERROUS SULFATE 325 (65 FE) MG TABLET    Take 1 tablet (325 mg total) by mouth daily with breakfast.   FOLIC ACID (FOLVITE) 1 MG TABLET    Take 1 tablet (1 mg total) by mouth daily.   GLIPIZIDE (GLUCOTROL) 5 MG TABLET    Take 5 mg by mouth daily before breakfast.    HYDROCORTISONE CREAM 0.5 %    Apply 1 application topically 2 (two) times daily.   HYDROXYZINE (ATARAX/VISTARIL) 25 MG TABLET    Take 25 mg by mouth every 6 (six) hours as needed.   INSULIN ASPART (NOVOLOG) 100 UNIT/ML INJECTION    Give 10 units  SQ with meals. Call MD if CBG >400.   INSULIN GLARGINE (LANTUS) 100 UNIT/ML INJECTION    Inject 35 Units into the skin at bedtime.    PRAVASTATIN (PRAVACHOL) 10 MG TABLET    Take 10 mg by mouth at bedtime.    PROPYLENE GLYCOL (SYSTANE BALANCE OP)    One drop in each eye daily for dry eye   QUETIAPINE (SEROQUEL) 100 MG TABLET    Take 100 mg by mouth at bedtime.   QUETIAPINE (SEROQUEL) 50 MG TABLET    Take 50 mg by mouth every morning.   SERTRALINE (ZOLOFT) 50 MG TABLET    Take 50 mg by mouth daily.   THIAMINE (VITAMIN B-1) 100 MG TABLET    Take 100 mg by mouth daily.    VALPROIC ACID (DEPAKENE) 250 MG/5ML SYRUP    Take 10 mLs (500 mg total) by mouth 3 (three) times daily.  Modified Medications   No medications on file  Discontinued Medications   LORAZEPAM (ATIVAN) 0.5 MG TABLET    Take 0.25 mg by mouth 3 (three) times daily as needed for anxiety.   TRADJENTA 5 MG TABS TABLET    Take 5 mg by mouth daily.      Physical Exam: Vitals:   09/10/16 1401  BP: 127/74  Pulse: 76  Resp: 18  Temp: 98.2 F (36.8 C)  SpO2: 96%  Weight: 218 lb (98.9 kg)  Height: 5\' 7"  (1.702 m)    Physical Exam  Constitutional: No distress.  Smells of urine  HENT:  Head: Normocephalic and atraumatic.  Nose: Nose normal.  Mouth/Throat: Oropharynx is clear and moist. No oropharyngeal exudate.  Edentulous. Very HOH Large amount of smokeless tobacco in mouth  Eyes: Conjunctivae and EOM are normal. Pupils are equal, round, and reactive to light. Right eye exhibits no discharge. Left eye exhibits no discharge.  Neck: Normal range of motion. Neck supple.  Cardiovascular: Normal rate and regular rhythm.   No murmur heard. BLE edema +1  Pulmonary/Chest: Effort normal and breath sounds normal.  Abdominal: Soft. Bowel sounds are normal.  Genitourinary:  Genitourinary Comments: incontinent of urine  Musculoskeletal: He exhibits no edema.  Neurological: He is alert.  Skin: Skin is warm and dry. He is not diaphoretic.  Few areas of excoriation to extremities.   Psychiatric: He has a normal mood and affect.    Labs reviewed: Basic Metabolic Panel:  Recent Labs  11/21/15 0811 12/29/15 2240 12/30/15 1252  01/14/16 1002  06/17/16 06/27/16 08/02/16  NA 139 135  --   < > 138  < > 135* 133* 135*  K 4.3 4.2  --   < > 5.6*  < > 5.5* 5.4* 4.9  CL 101 102  --   --  100  --   --   --   --   CO2 26 20*  --   --  25  --   --   --   --   GLUCOSE 150* 209*  --   --  196*  --   --   --   --   BUN 17 10  --   < > 20  < > 22* 21 26*  CREATININE 1.53* 1.55*  --   < > 1.41*  < > 1.5*  1.4* 1.5*  CALCIUM 9.4 8.6*  --   --  9.8  --   --   --   --   MG  --   --  1.7  --   --   --   --   --   --   < > =  values in this interval not displayed. Liver Function Tests:  Recent Labs  11/19/15 1945 12/30/15 1252 01/04/16 01/14/16 1002  AST 23 22 12* 12  ALT 10* 11* 10 15  ALKPHOS 73 57 54 60  BILITOT 0.4 0.7  --  0.2  PROT 7.4 6.4*  --  6.3  ALBUMIN 4.2 3.5  --  4.0   No results for input(s): LIPASE, AMYLASE in the last 8760 hours.  Recent Labs  01/14/16 1002  AMMONIA 42   CBC:  Recent Labs  12/29/15 2240 12/30/15 1655 01/04/16 01/14/16 1002 05/09/16  WBC 7.0 7.4 9.2 5.6 5.0  NEUTROABS  --   --   --  3.1  --   HGB 9.1* 9.9* 11.1*  --  12.6*  HCT 28.7* 31.6* 35* 37.0* 38*  MCV 85.2 86.3  --  88  --   PLT 194 215 248 171 154   TSH: No results for input(s): TSH in the last 8760 hours. A1C: Lab Results  Component Value Date   HGBA1C 7.8 08/02/2016   Lipid Panel:  Recent Labs  02/01/16 05/09/16  CHOL 145 159  HDL 29* 33*  LDLCALC 65 72  TRIG 253* 269*     Assessment/Plan 1. Essential hypertension Blood pressure stable on norvasc, will cont current regimen at this time.   2. Diabetes mellitus due to underlying condition, uncontrolled, with stage 2 chronic kidney disease, with long-term current use of insulin (HCC) Blood sugars improved with increase in lantus and use of novolog with meals. conts on glipizide 5 mg qam. Will cont current regimen  3. Seizure ([place]) Without recent seizures, conts on depakote.   4. Psychosis, unspecified psychosis type Stable, following with psych. Will cont current regimen.   5. Chronic renal insufficiency, stage III (moderate) Stable, cont hydration without nephrotoxic medicaton   Keone Kamer K. Harle Battiest  [facility] [phone] 8 am - 5 pm) [phone] (after hours)

## 2016-10-17 DIAGNOSIS — R1311 Dysphagia, oral phase: Secondary | ICD-10-CM | POA: Diagnosis not present

## 2016-10-17 DIAGNOSIS — G309 Alzheimer's disease, unspecified: Secondary | ICD-10-CM | POA: Diagnosis not present

## 2016-10-20 DIAGNOSIS — R1311 Dysphagia, oral phase: Secondary | ICD-10-CM | POA: Diagnosis not present

## 2016-10-20 DIAGNOSIS — G309 Alzheimer's disease, unspecified: Secondary | ICD-10-CM | POA: Diagnosis not present

## 2016-10-21 DIAGNOSIS — R1311 Dysphagia, oral phase: Secondary | ICD-10-CM | POA: Diagnosis not present

## 2016-10-21 DIAGNOSIS — G309 Alzheimer's disease, unspecified: Secondary | ICD-10-CM | POA: Diagnosis not present

## 2016-10-22 ENCOUNTER — Non-Acute Institutional Stay (SKILLED_NURSING_FACILITY): Payer: Medicare Other | Admitting: Nurse Practitioner

## 2016-10-22 ENCOUNTER — Encounter: Payer: Self-pay | Admitting: Nurse Practitioner

## 2016-10-22 DIAGNOSIS — N182 Chronic kidney disease, stage 2 (mild): Secondary | ICD-10-CM | POA: Diagnosis not present

## 2016-10-22 DIAGNOSIS — E0822 Diabetes mellitus due to underlying condition with diabetic chronic kidney disease: Secondary | ICD-10-CM | POA: Diagnosis not present

## 2016-10-22 DIAGNOSIS — E0865 Diabetes mellitus due to underlying condition with hyperglycemia: Secondary | ICD-10-CM

## 2016-10-22 DIAGNOSIS — N183 Chronic kidney disease, stage 3 unspecified: Secondary | ICD-10-CM

## 2016-10-22 DIAGNOSIS — R569 Unspecified convulsions: Secondary | ICD-10-CM

## 2016-10-22 DIAGNOSIS — R1311 Dysphagia, oral phase: Secondary | ICD-10-CM | POA: Diagnosis not present

## 2016-10-22 DIAGNOSIS — I10 Essential (primary) hypertension: Secondary | ICD-10-CM | POA: Diagnosis not present

## 2016-10-22 DIAGNOSIS — Z794 Long term (current) use of insulin: Secondary | ICD-10-CM

## 2016-10-22 DIAGNOSIS — D509 Iron deficiency anemia, unspecified: Secondary | ICD-10-CM | POA: Diagnosis not present

## 2016-10-22 DIAGNOSIS — G309 Alzheimer's disease, unspecified: Secondary | ICD-10-CM | POA: Diagnosis not present

## 2016-10-22 DIAGNOSIS — IMO0002 Reserved for concepts with insufficient information to code with codable children: Secondary | ICD-10-CM

## 2016-10-22 NOTE — Progress Notes (Signed)
Nursing Home Location:  [facility]  Place of Service: SNF (31)  PCP: [name], MD  No Known Allergies  Chief Complaint  Patient presents with  . Medical Management of Chronic Issues    Routine Visit    HPI:  Patient is a 71 y.o. male seen today at [facility] for routine follow up. Pt with hx of ETOH abuse, DM, HTN, seizures, anemia, depression. Pt has been doing well in the last month without acute issues. Pt conts to use smokeless tobacco. Pt also noncompliant with diabetic diet.  Pt reports still has occasionally itching.  Good appetite Denies pain.   Review of Systems: by pt and staff Review of Systems  Constitutional: Negative for activity change, appetite change, chills, diaphoresis, fatigue, fever and unexpected weight change.  HENT: Negative for congestion.   Respiratory: Negative for cough and shortness of breath.   Cardiovascular: Negative for chest pain, palpitations and leg swelling.  Gastrointestinal: Negative for abdominal distention, constipation and diarrhea.  Genitourinary: Negative for difficulty urinating, dysuria and flank pain.  Musculoskeletal: Negative for arthralgias and back pain.  Skin:       Pruritis   Neurological: Negative for dizziness, seizures and facial asymmetry.  Psychiatric/Behavioral: Positive for agitation (at times) and behavioral problems. Negative for confusion.    Past Medical History:  Diagnosis Date  . Abnormality of gait 01/14/2016  . Alcohol abuse 2009   dependency as exhibited by ETOH withdrawal seizure.   . Alcohol withdrawal seizure ([place]) 2013  . Anemia 2011   Normocytic.   . Cataract   . Chronic renal insufficiency, stage III (moderate) 02/26/2014  . Depression with anxiety   . Hearing impairment   . HTN (hypertension), benign 02/26/2014  . Hyperlipemia   . Hypertension   . Seizures ([place])   . Type II or unspecified type diabetes mellitus with neurological manifestations, not stated as  uncontrolled(250.60) 02/26/2014   Past Surgical History:  Procedure Laterality Date  . NO PAST SURGERIES     Social History:   reports that he has quit smoking. His smoking use included Cigars. His smokeless tobacco use includes Chew. He reports that he does not drink alcohol or use drugs.  Family History  Problem Relation Age of Onset  . Family history unknown: Yes    Medications: Patient's Medications  New Prescriptions   No medications on file  Previous Medications   ACETAMINOPHEN (TYLENOL) 500 MG TABLET    Take 2 tablets by mouth every morning and every evening for pain.   AMLODIPINE (NORVASC) 5 MG TABLET    Take 5 mg by mouth daily.    ASPIRIN (ASPIRIN EC) 81 MG EC TABLET    Take 81 mg by mouth daily. Swallow whole.   FERROUS SULFATE 325 (65 FE) MG TABLET    Take 1 tablet (325 mg total) by mouth daily with breakfast.   FOLIC ACID (FOLVITE) 1 MG TABLET    Take 1 tablet (1 mg total) by mouth daily.   GLIPIZIDE (GLUCOTROL) 5 MG TABLET    Take 5 mg by mouth daily before breakfast.    HYDROCORTISONE CREAM 0.5 %    Apply 1 application topically 2 (two) times daily.   INSULIN ASPART (NOVOLOG) 100 UNIT/ML INJECTION    Give 10 units SQ with meals. Call MD if CBG >400.   INSULIN GLARGINE (LANTUS) 100 UNIT/ML INJECTION    Inject 35 Units into the skin at bedtime.    PRAVASTATIN (PRAVACHOL) 10 MG TABLET  Take 10 mg by mouth at bedtime.    PROPYLENE GLYCOL (SYSTANE BALANCE OP)    One drop in each eye daily for dry eye   QUETIAPINE (SEROQUEL) 100 MG TABLET    Take 100 mg by mouth at bedtime.   QUETIAPINE (SEROQUEL) 50 MG TABLET    Take 50 mg by mouth every morning.   SERTRALINE (ZOLOFT) 50 MG TABLET    Take 50 mg by mouth daily.   THIAMINE (VITAMIN B-1) 100 MG TABLET    Take 100 mg by mouth daily.   VALPROIC ACID (DEPAKENE) 250 MG/5ML SYRUP    Take 10 mLs (500 mg total) by mouth 3 (three) times daily.  Modified Medications   No medications on file  Discontinued Medications   HYDROXYZINE  (ATARAX/VISTARIL) 25 MG TABLET    Take 25 mg by mouth every 6 (six) hours as needed.     Physical Exam: Vitals:   10/22/16 1402  BP: 132/84  Pulse: 92  Resp: 18  Temp: 97.7 F (36.5 C)  SpO2: 90%  Weight: 236 lb 9.6 oz (107.3 kg)  Height: 5\' 7"  (1.702 m)    Physical Exam  Constitutional: No distress.  Smells of urine  HENT:  Head: Normocephalic and atraumatic.  Nose: Nose normal.  Mouth/Throat: Oropharynx is clear and moist. No oropharyngeal exudate.  Edentulous. Very HOH Large amount of smokeless tobacco in mouth  Eyes: Conjunctivae and EOM are normal. Pupils are equal, round, and reactive to light. Right eye exhibits no discharge. Left eye exhibits no discharge.  Neck: Normal range of motion. Neck supple.  Cardiovascular: Normal rate and regular rhythm.   No murmur heard. BLE edema +1  Pulmonary/Chest: Effort normal and breath sounds normal.  Abdominal: Soft. Bowel sounds are normal.  Genitourinary:  Genitourinary Comments: incontinent of urine  Musculoskeletal: He exhibits no edema.  Neurological: He is alert.  Skin: Skin is warm and dry. He is not diaphoretic.  Few areas of excoriation to extremities.   Psychiatric: He has a normal mood and affect.    Labs reviewed: Basic Metabolic Panel:  Recent Labs  11/21/15 0811 12/29/15 2240 12/30/15 1252  01/14/16 1002  06/17/16 06/27/16 08/02/16  NA 139 135  --   < > 138  < > 135* 133* 135*  K 4.3 4.2  --   < > 5.6*  < > 5.5* 5.4* 4.9  CL 101 102  --   --  100  --   --   --   --   CO2 26 20*  --   --  25  --   --   --   --   GLUCOSE 150* 209*  --   --  196*  --   --   --   --   BUN 17 10  --   < > 20  < > 22* 21 26*  CREATININE 1.53* 1.55*  --   < > 1.41*  < > 1.5* 1.4* 1.5*  CALCIUM 9.4 8.6*  --   --  9.8  --   --   --   --   MG  --   --  1.7  --   --   --   --   --   --   < > = values in this interval not displayed. Liver Function Tests:  Recent Labs  11/19/15 1945 12/30/15 1252 01/04/16 01/14/16 1002    AST 23 22 12* 12  ALT 10* 11* 10 15  ALKPHOS 73 57 54 60  BILITOT 0.4 0.7  --  0.2  PROT 7.4 6.4*  --  6.3  ALBUMIN 4.2 3.5  --  4.0   No results for input(s): LIPASE, AMYLASE in the last 8760 hours.  Recent Labs  01/14/16 1002  AMMONIA 42   CBC:  Recent Labs  12/29/15 2240 12/30/15 1655 01/04/16 01/14/16 1002 05/09/16  WBC 7.0 7.4 9.2 5.6 5.0  NEUTROABS  --   --   --  3.1  --   HGB 9.1* 9.9* 11.1*  --  12.6*  HCT 28.7* 31.6* 35* 37.0* 38*  MCV 85.2 86.3  --  88  --   PLT 194 215 248 171 154   TSH: No results for input(s): TSH in the last 8760 hours. A1C: Lab Results  Component Value Date   HGBA1C 7.8 08/02/2016   Lipid Panel:  Recent Labs  02/01/16 05/09/16  CHOL 145 159  HDL 29* 33*  LDLCALC 65 72  TRIG 253* 269*     Assessment/Plan 1. Essential hypertension Blood pressure stable. conts on norvasc daily   2. Diabetes mellitus due to underlying condition, uncontrolled, with stage 2 chronic kidney disease, with long-term current use of insulin (HCC) Blood sugars remain stable on current regimen, will cont current medication. Again encouraged dietary compliance from pt.   3. Seizure ([place]) No recent seizures noted. conts on depakote   4. Chronic renal insufficiency, stage III (moderate) BUN/Cr stable on recent labs, Encourage proper hydration and to avoid NSAIDS (Aleve, Advil, Motrin, Ibuprofen)   5. Iron deficiency anemia, unspecified iron deficiency anemia type hgb stable on recent cbc, conts in iron supplements  Shean Gerding K. Harle Battiest  [facility] [phone] 8 am - 5 pm) [phone] (after hours)

## 2016-10-23 DIAGNOSIS — R1311 Dysphagia, oral phase: Secondary | ICD-10-CM | POA: Diagnosis not present

## 2016-10-23 DIAGNOSIS — G309 Alzheimer's disease, unspecified: Secondary | ICD-10-CM | POA: Diagnosis not present

## 2016-10-24 DIAGNOSIS — R1311 Dysphagia, oral phase: Secondary | ICD-10-CM | POA: Diagnosis not present

## 2016-10-24 DIAGNOSIS — G309 Alzheimer's disease, unspecified: Secondary | ICD-10-CM | POA: Diagnosis not present

## 2016-10-27 DIAGNOSIS — R1311 Dysphagia, oral phase: Secondary | ICD-10-CM | POA: Diagnosis not present

## 2016-10-27 DIAGNOSIS — G309 Alzheimer's disease, unspecified: Secondary | ICD-10-CM | POA: Diagnosis not present

## 2016-10-28 DIAGNOSIS — R1311 Dysphagia, oral phase: Secondary | ICD-10-CM | POA: Diagnosis not present

## 2016-10-28 DIAGNOSIS — G309 Alzheimer's disease, unspecified: Secondary | ICD-10-CM | POA: Diagnosis not present

## 2016-10-29 DIAGNOSIS — G309 Alzheimer's disease, unspecified: Secondary | ICD-10-CM | POA: Diagnosis not present

## 2016-10-29 DIAGNOSIS — R1311 Dysphagia, oral phase: Secondary | ICD-10-CM | POA: Diagnosis not present

## 2016-10-30 DIAGNOSIS — G309 Alzheimer's disease, unspecified: Secondary | ICD-10-CM | POA: Diagnosis not present

## 2016-10-30 DIAGNOSIS — R1311 Dysphagia, oral phase: Secondary | ICD-10-CM | POA: Diagnosis not present

## 2016-11-14 LAB — BASIC METABOLIC PANEL
BUN: 31 — AB (ref 4–21)
Creatinine: 1.4 — AB (ref 0.6–1.3)
GLUCOSE: 175
Potassium: 5.2 (ref 3.4–5.3)
SODIUM: 133 — AB (ref 137–147)

## 2016-11-14 LAB — HEPATIC FUNCTION PANEL
ALK PHOS: 59 (ref 25–125)
ALT: 11 (ref 10–40)
AST: 9 — AB (ref 14–40)
BILIRUBIN, TOTAL: 0.2

## 2016-11-18 ENCOUNTER — Ambulatory Visit: Payer: Medicare Other | Admitting: Adult Health

## 2016-11-21 ENCOUNTER — Non-Acute Institutional Stay (SKILLED_NURSING_FACILITY): Payer: Medicare Other | Admitting: Nurse Practitioner

## 2016-11-21 DIAGNOSIS — D509 Iron deficiency anemia, unspecified: Secondary | ICD-10-CM | POA: Diagnosis not present

## 2016-11-21 DIAGNOSIS — I10 Essential (primary) hypertension: Secondary | ICD-10-CM | POA: Diagnosis not present

## 2016-11-21 DIAGNOSIS — E0865 Diabetes mellitus due to underlying condition with hyperglycemia: Secondary | ICD-10-CM

## 2016-11-21 DIAGNOSIS — N183 Chronic kidney disease, stage 3 unspecified: Secondary | ICD-10-CM

## 2016-11-21 DIAGNOSIS — Z794 Long term (current) use of insulin: Secondary | ICD-10-CM | POA: Diagnosis not present

## 2016-11-21 DIAGNOSIS — E0822 Diabetes mellitus due to underlying condition with diabetic chronic kidney disease: Secondary | ICD-10-CM

## 2016-11-21 DIAGNOSIS — IMO0002 Reserved for concepts with insufficient information to code with codable children: Secondary | ICD-10-CM

## 2016-11-21 DIAGNOSIS — N182 Chronic kidney disease, stage 2 (mild): Secondary | ICD-10-CM

## 2016-11-21 NOTE — Progress Notes (Signed)
Nursing Home Location:  [facility]  Place of Service: SNF (31)  PCP: [name], MD  No Known Allergies  Chief Complaint  Patient presents with  . Medical Management of Chronic Issues    Resident is being seen to follow up on history of ETOH abuse, DM, HTN, seizures, anemia, depression    HPI:  Patient is a 71 y.o. male seen today at [facility] for routine follow up. Pt with hx of ETOH abuse, DM, HTN, seizures, anemia, depression. Remains noncompliant with diet. No hypoglycemia. Also conts to use chewing tobacco regularly.  Pt reports he needs his toenails cut.   Pt stays in room most of the time. Brief is saturated with urine but denies bladder incontinence.  Denies constipation.   Review of Systems: Review of Systems  Constitutional: Negative for activity change, appetite change, chills, diaphoresis, fatigue, fever and unexpected weight change.  HENT: Negative for congestion.   Respiratory: Negative for cough and shortness of breath.   Cardiovascular: Negative for chest pain, palpitations and leg swelling.  Gastrointestinal: Negative for abdominal distention, constipation and diarrhea.  Genitourinary: Negative for difficulty urinating, dysuria and flank pain.  Musculoskeletal: Negative for arthralgias and back pain.  Skin:       Pruritis   Neurological: Negative for dizziness, seizures and facial asymmetry.  Psychiatric/Behavioral: Positive for agitation (at times) and behavioral problems. Negative for confusion.    Past Medical History:  Diagnosis Date  . Abnormality of gait 01/14/2016  . Alcohol abuse 2009   dependency as exhibited by ETOH withdrawal seizure.   . Alcohol withdrawal seizure ([place]) 2013  . Anemia 2011   Normocytic.   . Cataract   . Chronic renal insufficiency, stage III (moderate) 02/26/2014  . Depression with anxiety   . Hearing impairment   . HTN (hypertension), benign 02/26/2014  . Hyperlipemia   . Hypertension   . Seizures  ([place])   . Type II or unspecified type diabetes mellitus with neurological manifestations, not stated as uncontrolled(250.60) 02/26/2014   Past Surgical History:  Procedure Laterality Date  . NO PAST SURGERIES     Social History:   reports that he has quit smoking. His smoking use included Cigars. His smokeless tobacco use includes Chew. He reports that he does not drink alcohol or use drugs.  Family History  Problem Relation Age of Onset  . Family history unknown: Yes    Medications: Patient's Medications  New Prescriptions   No medications on file  Previous Medications   ACETAMINOPHEN (TYLENOL) 500 MG TABLET    Take 2 tablets by mouth every morning and every evening for pain.   AMLODIPINE (NORVASC) 5 MG TABLET    Take 5 mg by mouth daily.    ASPIRIN (ASPIRIN EC) 81 MG EC TABLET    Take 81 mg by mouth daily. Swallow whole.   FERROUS SULFATE 325 (65 FE) MG TABLET    Take 1 tablet (325 mg total) by mouth daily with breakfast.   FOLIC ACID (FOLVITE) 1 MG TABLET    Take 1 tablet (1 mg total) by mouth daily.   GLIPIZIDE (GLUCOTROL) 5 MG TABLET    Take 5 mg by mouth daily before breakfast.    HYDROCORTISONE CREAM 0.5 %    Apply 1 application topically 2 (two) times daily.   INSULIN ASPART (NOVOLOG) 100 UNIT/ML INJECTION    Give 10 units SQ with meals. Call MD if CBG >400.   INSULIN GLARGINE (LANTUS) 100 UNIT/ML INJECTION  Inject 35 Units into the skin at bedtime.    PRAVASTATIN (PRAVACHOL) 10 MG TABLET    Take 10 mg by mouth at bedtime.    PROPYLENE GLYCOL (SYSTANE BALANCE OP)    One drop in each eye daily for dry eye   QUETIAPINE (SEROQUEL) 100 MG TABLET    Take 100 mg by mouth at bedtime.   QUETIAPINE (SEROQUEL) 50 MG TABLET    Take 50 mg by mouth every morning.   SERTRALINE (ZOLOFT) 50 MG TABLET    Take 50 mg by mouth daily.   THIAMINE (VITAMIN B-1) 100 MG TABLET    Take 100 mg by mouth daily.   VALPROIC ACID (DEPAKENE) 250 MG/5ML SYRUP    Take 10 mLs (500 mg total) by mouth 3  (three) times daily.  Modified Medications   No medications on file  Discontinued Medications   No medications on file     Physical Exam: Vitals:   11/21/16 1156  BP: 107/86  Pulse: 94  Resp: 20  Temp: 98.8 F (37.1 C)  SpO2: 94%  Weight: 236 lb 9.6 oz (107.3 kg)  Height: 5\' 7"  (1.702 m)    Physical Exam  Constitutional: No distress.  Smells of urine  HENT:  Head: Normocephalic and atraumatic.  Nose: Nose normal.  Mouth/Throat: Oropharynx is clear and moist. No oropharyngeal exudate.  Edentulous. Very HOH Large amount of smokeless tobacco in mouth  Eyes: Conjunctivae and EOM are normal. Pupils are equal, round, and reactive to light. Right eye exhibits no discharge. Left eye exhibits no discharge.  Neck: Normal range of motion. Neck supple.  Cardiovascular: Normal rate and regular rhythm.   No murmur heard. BLE edema +1  Pulmonary/Chest: Effort normal and breath sounds normal.  Abdominal: Soft. Bowel sounds are normal.  Genitourinary:  Genitourinary Comments: incontinent of urine  Musculoskeletal: He exhibits no edema.  Neurological: He is alert.  Skin: Skin is warm and dry. He is not diaphoretic.  Psychiatric: He has a normal mood and affect.    Labs reviewed: Basic Metabolic Panel:  Recent Labs  12/29/15 2240 12/30/15 1252  01/14/16 1002  06/17/16 06/27/16 08/02/16  NA 135  --   < > 138  < > 135* 133* 135*  K 4.2  --   < > 5.6*  < > 5.5* 5.4* 4.9  CL 102  --   --  100  --   --   --   --   CO2 20*  --   --  25  --   --   --   --   GLUCOSE 209*  --   --  196*  --   --   --   --   BUN 10  --   < > 20  < > 22* 21 26*  CREATININE 1.55*  --   < > 1.41*  < > 1.5* 1.4* 1.5*  CALCIUM 8.6*  --   --  9.8  --   --   --   --   MG  --  1.7  --   --   --   --   --   --   < > = values in this interval not displayed. Liver Function Tests:  Recent Labs  12/30/15 1252 01/04/16 01/14/16 1002  AST 22 12* 12  ALT 11* 10 15  ALKPHOS 57 54 60  BILITOT 0.7  --  0.2    PROT 6.4*  --  6.3  ALBUMIN 3.5  --  4.0   No results for input(s): LIPASE, AMYLASE in the last 8760 hours.  Recent Labs  01/14/16 1002  AMMONIA 42   CBC:  Recent Labs  12/29/15 2240 12/30/15 1655 01/04/16 01/14/16 1002 05/09/16  WBC 7.0 7.4 9.2 5.6 5.0  NEUTROABS  --   --   --  3.1  --   HGB 9.1* 9.9* 11.1*  --  12.6*  HCT 28.7* 31.6* 35* 37.0* 38*  MCV 85.2 86.3  --  88  --   PLT 194 215 248 171 154   TSH: No results for input(s): TSH in the last 8760 hours. A1C: Lab Results  Component Value Date   HGBA1C 7.8 08/02/2016   Lipid Panel:  Recent Labs  02/01/16 05/09/16  CHOL 145 159  HDL 29* 33*  LDLCALC 65 72  TRIG 253* 269*     Assessment/Plan 1. Diabetes mellitus due to underlying condition, uncontrolled, with stage 2 chronic kidney disease, with long-term current use of insulin (HCC) Blood sugars reviewed and stable, will follow up A1c, conts on glipizide and lantus 35 units daily with mealtime novolog  2. Iron deficiency anemia, unspecified iron deficiency anemia type Remains on iron, will follow up CBC  3. Essential hypertension Blood pressure stable, conts on norvasc,   4. Chronic renal insufficiency, stage III (moderate) Will follow up renal function at this time.   5. Hyperlipidemia. Remains on Pravachol. Will follow up liver enzymes. Dietary compliance discussed    Carlos American. Harle Battiest  [facility] [phone] 8 am - 5 pm) [phone] (after hours)

## 2016-11-24 DIAGNOSIS — I10 Essential (primary) hypertension: Secondary | ICD-10-CM | POA: Diagnosis not present

## 2016-11-24 DIAGNOSIS — E119 Type 2 diabetes mellitus without complications: Secondary | ICD-10-CM | POA: Diagnosis not present

## 2016-11-24 LAB — HEPATIC FUNCTION PANEL
ALT: 7 U/L — AB (ref 10–40)
AST: 8 U/L — AB (ref 14–40)
Alkaline Phosphatase: 65 U/L (ref 25–125)
BILIRUBIN, TOTAL: 0.2 mg/dL

## 2016-11-24 LAB — BASIC METABOLIC PANEL
BUN: 23 mg/dL — AB (ref 4–21)
CREATININE: 1.3 mg/dL (ref 0.6–1.3)
GLUCOSE: 190 mg/dL
Potassium: 5 mmol/L (ref 3.4–5.3)
Sodium: 142 mmol/L (ref 137–147)

## 2016-11-24 LAB — CBC AND DIFFERENTIAL
HEMATOCRIT: 44 % (ref 41–53)
Hemoglobin: 13.9 g/dL (ref 13.5–17.5)
PLATELETS: 185 10*3/uL (ref 150–399)
WBC: 5.9 10^3/mL

## 2016-11-24 LAB — HEMOGLOBIN A1C: HEMOGLOBIN A1C: 7.2

## 2016-12-22 ENCOUNTER — Non-Acute Institutional Stay (SKILLED_NURSING_FACILITY): Payer: Medicare Other | Admitting: Nurse Practitioner

## 2016-12-22 ENCOUNTER — Encounter: Payer: Self-pay | Admitting: Nurse Practitioner

## 2016-12-22 DIAGNOSIS — D509 Iron deficiency anemia, unspecified: Secondary | ICD-10-CM

## 2016-12-22 DIAGNOSIS — E0865 Diabetes mellitus due to underlying condition with hyperglycemia: Secondary | ICD-10-CM | POA: Diagnosis not present

## 2016-12-22 DIAGNOSIS — IMO0002 Reserved for concepts with insufficient information to code with codable children: Secondary | ICD-10-CM

## 2016-12-22 DIAGNOSIS — N183 Chronic kidney disease, stage 3 unspecified: Secondary | ICD-10-CM

## 2016-12-22 DIAGNOSIS — N182 Chronic kidney disease, stage 2 (mild): Secondary | ICD-10-CM

## 2016-12-22 DIAGNOSIS — E785 Hyperlipidemia, unspecified: Secondary | ICD-10-CM

## 2016-12-22 DIAGNOSIS — E0822 Diabetes mellitus due to underlying condition with diabetic chronic kidney disease: Secondary | ICD-10-CM | POA: Diagnosis not present

## 2016-12-22 DIAGNOSIS — Z794 Long term (current) use of insulin: Secondary | ICD-10-CM | POA: Diagnosis not present

## 2016-12-22 DIAGNOSIS — I10 Essential (primary) hypertension: Secondary | ICD-10-CM | POA: Diagnosis not present

## 2016-12-22 NOTE — Progress Notes (Signed)
Nursing Home Location:  [facility]  Place of Service: SNF (31)  PCP: [name], MD  No Known Allergies  Chief Complaint  Patient presents with  . Medical Management of Chronic Issues    Resident is being seen for routine visit.     HPI:  Patient is a 71 y.o. male seen today at [facility] for routine follow up. Pt with hx of ETOH abuse, DM, HTN, seizures, anemia, depression. He has no new complaints today. Remains noncompliant with diet and continues daily chewing tobacco. Admits to intermittent incontinence and chooses to wear briefs. Reports eating and sleeping well. Denies constipation. Denies pain.   Review of Systems: Review of Systems  Constitutional: Negative for activity change, appetite change, chills, diaphoresis, fatigue, fever and unexpected weight change.  HENT: Negative for congestion, rhinorrhea and sore throat.   Eyes: Negative for visual disturbance.  Respiratory: Positive for wheezing. Negative for cough, chest tightness and shortness of breath.   Cardiovascular: Negative for chest pain, palpitations and leg swelling.  Gastrointestinal: Negative for abdominal distention, abdominal pain, constipation, diarrhea, nausea and vomiting.  Genitourinary: Positive for urgency. Negative for difficulty urinating, dysuria and flank pain.  Musculoskeletal: Negative for arthralgias and back pain.       Ambulates with wheelchair assistance  Skin: Negative for color change and wound.       Pruritis   Neurological: Negative for dizziness, seizures and facial asymmetry.  Psychiatric/Behavioral: Positive for confusion. Negative for agitation (at times), behavioral problems and sleep disturbance.    Past Medical History:  Diagnosis Date  . Abnormality of gait 01/14/2016  . Alcohol abuse 2009   dependency as exhibited by ETOH withdrawal seizure.   . Alcohol withdrawal seizure ([place]) 2013  . Anemia 2011   Normocytic.   . Cataract   . Chronic renal  insufficiency, stage III (moderate) 02/26/2014  . Depression with anxiety   . Hearing impairment   . HTN (hypertension), benign 02/26/2014  . Hyperlipemia   . Hypertension   . Seizures ([place])   . Type II or unspecified type diabetes mellitus with neurological manifestations, not stated as uncontrolled(250.60) 02/26/2014   Past Surgical History:  Procedure Laterality Date  . NO PAST SURGERIES     Social History:   reports that he has quit smoking. His smoking use included Cigars. His smokeless tobacco use includes Chew. He reports that he does not drink alcohol or use drugs.  Family History  Problem Relation Age of Onset  . Family history unknown: Yes    Medications: Patient's Medications  New Prescriptions   No medications on file  Previous Medications   ACETAMINOPHEN (TYLENOL) 500 MG TABLET    Take 2 tablets by mouth every morning and every evening for pain.   AMLODIPINE (NORVASC) 5 MG TABLET    Take 5 mg by mouth daily.    ASPIRIN (ASPIRIN EC) 81 MG EC TABLET    Take 81 mg by mouth daily. Swallow whole.   FERROUS SULFATE 325 (65 FE) MG TABLET    Take 1 tablet (325 mg total) by mouth daily with breakfast.   FOLIC ACID (FOLVITE) 1 MG TABLET    Take 1 tablet (1 mg total) by mouth daily.   GLIPIZIDE (GLUCOTROL) 5 MG TABLET    Take 5 mg by mouth daily before breakfast.    HYDROCORTISONE CREAM 0.5 %    Apply 1 application topically 2 (two) times daily.   INSULIN ASPART (NOVOLOG) 100 UNIT/ML INJECTION  Give 10 units SQ with meals. Call MD if CBG >400.   INSULIN GLARGINE (LANTUS) 100 UNIT/ML INJECTION    Inject 35 Units into the skin at bedtime.    PRAVASTATIN (PRAVACHOL) 10 MG TABLET    Take 10 mg by mouth at bedtime.    PROPYLENE GLYCOL (SYSTANE BALANCE OP)    One drop in each eye daily for dry eye   QUETIAPINE (SEROQUEL) 100 MG TABLET    Take 100 mg by mouth 2 (two) times daily.    SERTRALINE (ZOLOFT) 50 MG TABLET    Take 50 mg by mouth daily.   THIAMINE (VITAMIN B-1) 100 MG TABLET     Take 100 mg by mouth daily.   VALPROIC ACID (DEPAKENE) 250 MG/5ML SYRUP    Take 10 mLs (500 mg total) by mouth 3 (three) times daily.  Modified Medications   No medications on file  Discontinued Medications   QUETIAPINE (SEROQUEL) 50 MG TABLET    Take 50 mg by mouth every morning.     Physical Exam: Vitals:   12/22/16 1351  BP: 110/86  Pulse: 82  Resp: 19  Temp: (!) 96.4 F (35.8 C)  SpO2: 99%  Weight: 237 lb (107.5 kg)  Height: 5\' 7"  (1.702 m)    Physical Exam  Constitutional: He appears well-developed and well-nourished. No distress.  Smells of urine  HENT:  Head: Normocephalic and atraumatic.  Right Ear: External ear normal.  Left Ear: External ear normal.  Nose: Nose normal.  Mouth/Throat: Oropharynx is clear and moist. No oropharyngeal exudate.  Edentulous. Very HOH Large amount of smokeless tobacco in mouth  Eyes: Conjunctivae and EOM are normal. Pupils are equal, round, and reactive to light. Right eye exhibits no discharge. Left eye exhibits no discharge.  Neck: Normal range of motion. Neck supple.  Cardiovascular: Normal rate, regular rhythm and normal heart sounds.   No murmur heard. BLE edema +1  Pulmonary/Chest: Effort normal and breath sounds normal. No respiratory distress. He has no wheezes. He has no rales.  Abdominal: Soft. Bowel sounds are normal. He exhibits distension. There is no tenderness. There is no guarding.  Genitourinary:  Genitourinary Comments: incontinent of urine  Musculoskeletal: He exhibits edema.  1+ bilateral lower extremity swelling  Neurological: He is alert.  Oriented to person, and place only  Skin: Skin is warm and dry. He is not diaphoretic.  Psychiatric: He has a normal mood and affect. His behavior is normal.    Labs reviewed: Basic Metabolic Panel:  Recent Labs  12/29/15 2240 12/30/15 1252  01/14/16 1002  06/27/16 08/02/16 11/24/16  NA 135  --   < > 138  < > 133* 135* 142  K 4.2  --   < > 5.6*  < > 5.4* 4.9 5.0    CL 102  --   --  100  --   --   --   --   CO2 20*  --   --  25  --   --   --   --   GLUCOSE 209*  --   --  196*  --   --   --   --   BUN 10  --   < > 20  < > 21 26* 23*  CREATININE 1.55*  --   < > 1.41*  < > 1.4* 1.5* 1.3  CALCIUM 8.6*  --   --  9.8  --   --   --   --   MG  --  1.7  --   --   --   --   --   --   < > = values in this interval not displayed. Liver Function Tests:  Recent Labs  12/30/15 1252 01/04/16 01/14/16 1002 11/24/16  AST 22 12* 12 8*  ALT 11* 10 15 7*  ALKPHOS 57 54 60 65  BILITOT 0.7  --  0.2  --   PROT 6.4*  --  6.3  --   ALBUMIN 3.5  --  4.0  --    No results for input(s): LIPASE, AMYLASE in the last 8760 hours.  Recent Labs  01/14/16 1002  AMMONIA 42   CBC:  Recent Labs  12/29/15 2240 12/30/15 1655  01/04/16 01/14/16 1002 05/09/16 11/24/16  WBC 7.0 7.4  < > 9.2 5.6 5.0 5.9  NEUTROABS  --   --   --   --  3.1  --   --   HGB 9.1* 9.9*  --  11.1*  --  12.6* 13.9  HCT 28.7* 31.6*  --  35* 37.0* 38* 44  MCV 85.2 86.3  --   --  88  --   --   PLT 194 215  --  248 171 154 185  < > = values in this interval not displayed. TSH: No results for input(s): TSH in the last 8760 hours. A1C: Lab Results  Component Value Date   HGBA1C 7.2 11/24/2016   Lipid Panel:  Recent Labs  02/01/16 05/09/16  CHOL 145 159  HDL 29* 33*  LDLCALC 65 72  TRIG 253* 269*    Assessment/Plan  1. Iron deficiency anemia, unspecified iron deficiency anemia type -Remains on iron, last CBC, WNL (2/26)  2. Hyperlipidemia, unspecified hyperlipidemia type -Remains on Pravachol. Liver enzymes WNL as of 2/26. Dietary compliance discussed   3. Chronic renal insufficiency, stage III (moderate) -Renal function with slight elevation, BUN=23, Cr=1.3  4. Diabetes mellitus due to underlying condition, uncontrolled, with stage 2 chronic kidney disease, with long-term current use of insulin (HCC) -Blood sugars reviewed and stable. A1c=7.2. Continue glipizide and lantus 35 units  daily with mealtime novolog -Diet and compliance education provided.   5. Essential hypertension -Blood pressure stable, conts on norvasc, BP today 110/86   Allea Kassner K. Harle Battiest  [facility] [phone] 8 am - 5 pm) [phone] (after hours)

## 2016-12-23 DIAGNOSIS — R262 Difficulty in walking, not elsewhere classified: Secondary | ICD-10-CM | POA: Diagnosis not present

## 2016-12-23 DIAGNOSIS — I129 Hypertensive chronic kidney disease with stage 1 through stage 4 chronic kidney disease, or unspecified chronic kidney disease: Secondary | ICD-10-CM | POA: Diagnosis not present

## 2016-12-23 DIAGNOSIS — M6281 Muscle weakness (generalized): Secondary | ICD-10-CM | POA: Diagnosis not present

## 2016-12-24 DIAGNOSIS — I129 Hypertensive chronic kidney disease with stage 1 through stage 4 chronic kidney disease, or unspecified chronic kidney disease: Secondary | ICD-10-CM | POA: Diagnosis not present

## 2016-12-24 DIAGNOSIS — R262 Difficulty in walking, not elsewhere classified: Secondary | ICD-10-CM | POA: Diagnosis not present

## 2016-12-24 DIAGNOSIS — M6281 Muscle weakness (generalized): Secondary | ICD-10-CM | POA: Diagnosis not present

## 2016-12-25 DIAGNOSIS — M6281 Muscle weakness (generalized): Secondary | ICD-10-CM | POA: Diagnosis not present

## 2016-12-25 DIAGNOSIS — R488 Other symbolic dysfunctions: Secondary | ICD-10-CM | POA: Diagnosis not present

## 2016-12-25 DIAGNOSIS — I129 Hypertensive chronic kidney disease with stage 1 through stage 4 chronic kidney disease, or unspecified chronic kidney disease: Secondary | ICD-10-CM | POA: Diagnosis not present

## 2016-12-25 DIAGNOSIS — R262 Difficulty in walking, not elsewhere classified: Secondary | ICD-10-CM | POA: Diagnosis not present

## 2016-12-25 DIAGNOSIS — N183 Chronic kidney disease, stage 3 (moderate): Secondary | ICD-10-CM | POA: Diagnosis not present

## 2016-12-25 DIAGNOSIS — E119 Type 2 diabetes mellitus without complications: Secondary | ICD-10-CM | POA: Diagnosis not present

## 2016-12-25 LAB — MICROALBUMIN, URINE: MICROALB UR: 18.2

## 2016-12-26 DIAGNOSIS — M6281 Muscle weakness (generalized): Secondary | ICD-10-CM | POA: Diagnosis not present

## 2016-12-26 DIAGNOSIS — I129 Hypertensive chronic kidney disease with stage 1 through stage 4 chronic kidney disease, or unspecified chronic kidney disease: Secondary | ICD-10-CM | POA: Diagnosis not present

## 2016-12-26 DIAGNOSIS — R262 Difficulty in walking, not elsewhere classified: Secondary | ICD-10-CM | POA: Diagnosis not present

## 2016-12-27 DIAGNOSIS — I129 Hypertensive chronic kidney disease with stage 1 through stage 4 chronic kidney disease, or unspecified chronic kidney disease: Secondary | ICD-10-CM | POA: Diagnosis not present

## 2016-12-27 DIAGNOSIS — M6281 Muscle weakness (generalized): Secondary | ICD-10-CM | POA: Diagnosis not present

## 2016-12-27 DIAGNOSIS — R262 Difficulty in walking, not elsewhere classified: Secondary | ICD-10-CM | POA: Diagnosis not present

## 2016-12-29 DIAGNOSIS — R262 Difficulty in walking, not elsewhere classified: Secondary | ICD-10-CM | POA: Diagnosis not present

## 2016-12-29 DIAGNOSIS — I129 Hypertensive chronic kidney disease with stage 1 through stage 4 chronic kidney disease, or unspecified chronic kidney disease: Secondary | ICD-10-CM | POA: Diagnosis not present

## 2016-12-29 DIAGNOSIS — M6281 Muscle weakness (generalized): Secondary | ICD-10-CM | POA: Diagnosis not present

## 2016-12-30 DIAGNOSIS — I129 Hypertensive chronic kidney disease with stage 1 through stage 4 chronic kidney disease, or unspecified chronic kidney disease: Secondary | ICD-10-CM | POA: Diagnosis not present

## 2016-12-30 DIAGNOSIS — R262 Difficulty in walking, not elsewhere classified: Secondary | ICD-10-CM | POA: Diagnosis not present

## 2016-12-30 DIAGNOSIS — M6281 Muscle weakness (generalized): Secondary | ICD-10-CM | POA: Diagnosis not present

## 2016-12-31 ENCOUNTER — Encounter: Payer: Self-pay | Admitting: Adult Health

## 2016-12-31 ENCOUNTER — Ambulatory Visit (INDEPENDENT_AMBULATORY_CARE_PROVIDER_SITE_OTHER): Payer: Medicare Other | Admitting: Adult Health

## 2016-12-31 ENCOUNTER — Encounter (INDEPENDENT_AMBULATORY_CARE_PROVIDER_SITE_OTHER): Payer: Self-pay

## 2016-12-31 VITALS — BP 127/83 | HR 100

## 2016-12-31 DIAGNOSIS — Z5181 Encounter for therapeutic drug level monitoring: Secondary | ICD-10-CM | POA: Diagnosis not present

## 2016-12-31 DIAGNOSIS — R262 Difficulty in walking, not elsewhere classified: Secondary | ICD-10-CM | POA: Diagnosis not present

## 2016-12-31 DIAGNOSIS — R569 Unspecified convulsions: Secondary | ICD-10-CM | POA: Diagnosis not present

## 2016-12-31 DIAGNOSIS — I129 Hypertensive chronic kidney disease with stage 1 through stage 4 chronic kidney disease, or unspecified chronic kidney disease: Secondary | ICD-10-CM | POA: Diagnosis not present

## 2016-12-31 DIAGNOSIS — M6281 Muscle weakness (generalized): Secondary | ICD-10-CM | POA: Diagnosis not present

## 2016-12-31 NOTE — Patient Instructions (Signed)
Continue Depakote- Blood work today If you have any seizure events please let us know.   

## 2016-12-31 NOTE — Progress Notes (Signed)
PATIENT: William [name] DOB: June 03, 1946  REASON FOR VISIT: follow up- seizures HISTORY FROM: patient  HISTORY OF PRESENT ILLNESS: William [name] is a 71 year old male with a history of seizures. He returns today for follow-up. He is with a caregiver today. He reports that he is not had any seizure events. According to his medication administration chart sent by the facility he has been taking Depakote 500 mg 3 times a day. He does require assistance with ADLs. He denies any new neurological symptoms. He returns today for an evaluation.  HISTORY 12/31/16: William [name] is a 70 year old male with a history of alcohol abuse and seizures. He returns today for follow-up. He is currently on Depakote 500 mg 3 times a day. The patient resides in an extended care facility. Unfortunately he does not have any family present today. He was brought to this visit by transporter who is not familiar with his medical history. The patient appears to be a poor historian. He denies any seizure activity. He does require assistance with ADLs. The patient had blood work in April that was unremarkable. Denies any new neurological conditions. He returns today for an evaluation.  HISTORY 01/14/16: William [name] is a 71 year old white male with a history of alcohol abuse, and significant hearing deficits. The patient has been residing in an extended care facility, he has apparently had bottles of liquor found in his room. The patient presented to the hospital on 12/29/2015 with a prolonged 10 minute seizure event. This was described as generalized tonic-clonic episode. In 2015, the patient had an EEG study with frequent sharp wave activity from the left frontotemporal area. The patient was not on any seizure medications prior to this recent admission. He was started on Depakote during that admission. The patient may have agitation following the seizures. He has had a significant gait disorder, he walks with a walker, the duration  of this gait problem is not clear. The patient comes to the office today with a caretaker who is unfamiliar with the patient. The patient wears adult diapers, he may be incontinent of the bowels and bladder. There is some report of dementia in the records from the hospital. The patient underwent a CT scan of the brain and cervical spine while in the hospital. This showed some small vessel ischemic changes, no acute changes were seen. The patient is currently on Depakene taking 500 mg 3 times daily. He comes to this office for an evaluation.   REVIEW OF SYSTEMS: Out of a complete 14 system review of symptoms, the patient complains only of the following symptoms, and all other reviewed systems are negative.  ALLERGIES: No Known Allergies  HOME MEDICATIONS: Outpatient Medications Prior to Visit  Medication Sig Dispense Refill  . acetaminophen (TYLENOL) 500 MG tablet Take 2 tablets by mouth every morning and every evening for pain.    Marland Kitchen amLODipine (NORVASC) 5 MG tablet Take 5 mg by mouth daily.     Marland Kitchen aspirin (ASPIRIN EC) 81 MG EC tablet Take 81 mg by mouth daily. Swallow whole.    . ferrous sulfate 325 (65 FE) MG tablet Take 1 tablet (325 mg total) by mouth daily with breakfast. 60 tablet 3  . folic acid (FOLVITE) 1 MG tablet Take 1 tablet (1 mg total) by mouth daily. 30 tablet 1  . glipiZIDE (GLUCOTROL) 5 MG tablet Take 5 mg by mouth daily before breakfast.     . hydrocortisone cream 0.5 % Apply 1 application topically 2 (two) times daily.    Marland Kitchen  insulin aspart (NOVOLOG) 100 UNIT/ML injection Give 10 units SQ with meals. Call MD if CBG >400.    Marland Kitchen insulin glargine (LANTUS) 100 UNIT/ML injection Inject 35 Units into the skin at bedtime.     . pravastatin (PRAVACHOL) 10 MG tablet Take 10 mg by mouth at bedtime.     Marland Kitchen Propylene Glycol (SYSTANE BALANCE OP) One drop in each eye daily for dry eye    . QUEtiapine (SEROQUEL) 100 MG tablet Take 100 mg by mouth 2 (two) times daily.     . sertraline (ZOLOFT)  50 MG tablet Take 50 mg by mouth daily.    Marland Kitchen thiamine (VITAMIN B-1) 100 MG tablet Take 100 mg by mouth daily.    Marland Kitchen valproic acid (DEPAKENE) 250 MG/5ML syrup Take 10 mLs (500 mg total) by mouth 3 (three) times daily. 240 mL 12   No facility-administered medications prior to visit.     PAST MEDICAL HISTORY: Past Medical History:  Diagnosis Date  . Abnormality of gait 01/14/2016  . Alcohol abuse 2009   dependency as exhibited by ETOH withdrawal seizure.   . Alcohol withdrawal seizure ([place]) 2013  . Anemia 2011   Normocytic.   . Cataract   . Chronic renal insufficiency, stage III (moderate) 02/26/2014  . Depression with anxiety   . Hearing impairment   . HTN (hypertension), benign 02/26/2014  . Hyperlipemia   . Hypertension   . Seizures ([place])   . Type II or unspecified type diabetes mellitus with neurological manifestations, not stated as uncontrolled(250.60) 02/26/2014    PAST SURGICAL HISTORY: Past Surgical History:  Procedure Laterality Date  . NO PAST SURGERIES      FAMILY HISTORY: Family History  Problem Relation Age of Onset  . Family history unknown: Yes    SOCIAL HISTORY: Social History   Social History  . Marital status: Single    Spouse name: N/A  . Number of children: 0  . Years of education: N/A   Occupational History  . Not on file.   Social History Main Topics  . Smoking status: Former Smoker    Types: Cigars  . Smokeless tobacco: Current User    Types: Chew  . Alcohol use No     Comment: 04/2015 I have not drank in over a month "  . Drug use: No  . Sexual activity: Not Currently   Other Topics Concern  . Not on file   Social History Narrative   ** Merged History Encounter **[facility]             PHYSICAL EXAM  Vitals:   12/31/16 0831  BP: 127/83  Pulse: 100   There is no height or weight on file to calculate BMI.  Generalized: Well developed, in no acute distress   Neurological examination  Mentation: Alert. Follows all  commands speech and language fluent Cranial nerve II-XII: Pupils were equal round reactive to light. Extraocular movements were full, visual field were full on confrontational test. Facial sensation and strength were normal. Uvula tongue midline. Head turning and shoulder shrug  were normal and symmetric. Motor: The motor testing reveals 5 over 5 strength of all 4 extremities. Good symmetric motor tone is noted throughout.  Sensory: Sensory testing is intact to soft touch on all 4 extremities. No evidence of extinction is noted.  Coordination: Cerebellar testing reveals good finger-nose-finger and heel-to-shin bilaterally.  Gait and station: patient in a wheelchair.  Reflexes: Deep tendon reflexes are symmetric and normal bilaterally.   DIAGNOSTIC  DATA (LABS, IMAGING, TESTING) - I reviewed patient records, labs, notes, testing and imaging myself where available.  Lab Results  Component Value Date   WBC 5.9 11/24/2016   HGB 13.9 11/24/2016   HCT 44 11/24/2016   MCV 88 01/14/2016   PLT 185 11/24/2016      Component Value Date/Time   NA 142 11/24/2016   K 5.0 11/24/2016   CL 100 01/14/2016 1002   CO2 25 01/14/2016 1002   GLUCOSE 196 (H) 01/14/2016 1002   GLUCOSE 209 (H) 12/29/2015 2240   BUN 23 (A) 11/24/2016   CREATININE 1.3 11/24/2016   CREATININE 1.41 (H) 01/14/2016 1002   CREATININE 1.08 01/11/2014 1531   CALCIUM 9.8 01/14/2016 1002   PROT 6.3 01/14/2016 1002   ALBUMIN 4.0 01/14/2016 1002   AST 8 (A) 11/24/2016   ALT 7 (A) 11/24/2016   ALKPHOS 65 11/24/2016   BILITOT 0.2 01/14/2016 1002   GFRNONAA 49 (L) 01/14/2016 1002   GFRNONAA 69 01/11/2014 1531   GFRAA 57 (L) 01/14/2016 1002   GFRAA 80 01/11/2014 1531   Lab Results  Component Value Date   CHOL 159 05/09/2016   HDL 33 (A) 05/09/2016   LDLCALC 72 05/09/2016   TRIG 269 (A) 05/09/2016   CHOLHDL 2.2 05/23/2013   Lab Results  Component Value Date   HGBA1C 7.2 11/24/2016   Lab Results  Component Value Date    VITAMINB12 220 11/20/2015   Lab Results  Component Value Date   TSH 3.661 05/10/2013      ASSESSMENT AND PLAN 71 y.o. year old male  has a past medical history of Abnormality of gait (01/14/2016); Alcohol abuse (2009); Alcohol withdrawal seizure ([place]) (2013); Anemia (2011); Cataract; Chronic renal insufficiency, stage III (moderate) (02/26/2014); Depression with anxiety; Hearing impairment; HTN (hypertension), benign (02/26/2014); Hyperlipemia; Hypertension; Seizures ([place]); and Type II or unspecified type diabetes mellitus with neurological manifestations, not stated as uncontrolled(250.60) (02/26/2014). here with;  1. Seizures  Overall the patient has remained stable. He will continue on Depakote 500 mg 3 times a day. I will check blood work today. Patient is advised that if he has any seizure events he should let us know. He will follow-up in 6 months with Dr. Jannifer Franklin.  I spent 15 minutes with the patient 50% of this time was spent discussing the patient's medication and reviewing notes sent by his facility.    Ward Givens, MSN, NP-C 12/31/2016, 12:13 PM [facility] [address], [place], [place] [address] [phone]

## 2016-12-31 NOTE — Progress Notes (Signed)
I have read the note, and I agree with the clinical assessment and plan.  Nealy Karapetian KEITH   

## 2017-01-01 ENCOUNTER — Telehealth: Payer: Self-pay

## 2017-01-01 DIAGNOSIS — M6281 Muscle weakness (generalized): Secondary | ICD-10-CM | POA: Diagnosis not present

## 2017-01-01 DIAGNOSIS — R262 Difficulty in walking, not elsewhere classified: Secondary | ICD-10-CM | POA: Diagnosis not present

## 2017-01-01 DIAGNOSIS — I129 Hypertensive chronic kidney disease with stage 1 through stage 4 chronic kidney disease, or unspecified chronic kidney disease: Secondary | ICD-10-CM | POA: Diagnosis not present

## 2017-01-01 LAB — COMPREHENSIVE METABOLIC PANEL
ALT: 11 IU/L (ref 0–44)
AST: 12 IU/L (ref 0–40)
Albumin/Globulin Ratio: 1.2 (ref 1.2–2.2)
Albumin: 3.9 g/dL (ref 3.5–4.8)
Alkaline Phosphatase: 65 IU/L (ref 39–117)
BILIRUBIN TOTAL: 0.2 mg/dL (ref 0.0–1.2)
BUN/Creatinine Ratio: 14 (ref 10–24)
BUN: 22 mg/dL (ref 8–27)
CALCIUM: 9.6 mg/dL (ref 8.6–10.2)
CHLORIDE: 97 mmol/L (ref 96–106)
CO2: 22 mmol/L (ref 18–29)
Creatinine, Ser: 1.54 mg/dL — ABNORMAL HIGH (ref 0.76–1.27)
GFR, EST AFRICAN AMERICAN: 51 mL/min/{1.73_m2} — AB (ref >59)
GFR, EST NON AFRICAN AMERICAN: 44 mL/min/{1.73_m2} — AB (ref >59)
GLUCOSE: 190 mg/dL — AB (ref 65–99)
Globulin, Total: 3.3 g/dL (ref 1.5–4.5)
Potassium: 5 mmol/L (ref 3.5–5.2)
Sodium: 138 mmol/L (ref 134–144)
TOTAL PROTEIN: 7.2 g/dL (ref 6.0–8.5)

## 2017-01-01 LAB — CBC WITH DIFFERENTIAL/PLATELET
BASOS ABS: 0 10*3/uL (ref 0.0–0.2)
Basos: 1 %
EOS (ABSOLUTE): 0.3 10*3/uL (ref 0.0–0.4)
Eos: 4 %
HEMOGLOBIN: 12.4 g/dL — AB (ref 13.0–17.7)
Hematocrit: 39.1 % (ref 37.5–51.0)
IMMATURE GRANS (ABS): 0.1 10*3/uL (ref 0.0–0.1)
IMMATURE GRANULOCYTES: 1 %
LYMPHS: 24 %
Lymphocytes Absolute: 1.6 10*3/uL (ref 0.7–3.1)
MCH: 29.6 pg (ref 26.6–33.0)
MCHC: 31.7 g/dL (ref 31.5–35.7)
MCV: 93 fL (ref 79–97)
MONOCYTES: 10 %
Monocytes Absolute: 0.6 10*3/uL (ref 0.1–0.9)
NEUTROS ABS: 3.9 10*3/uL (ref 1.4–7.0)
NEUTROS PCT: 60 %
PLATELETS: 187 10*3/uL (ref 150–379)
RBC: 4.19 x10E6/uL (ref 4.14–5.80)
RDW: 14.8 % (ref 12.3–15.4)
WBC: 6.5 10*3/uL (ref 3.4–10.8)

## 2017-01-01 LAB — VALPROIC ACID LEVEL: VALPROIC ACID LVL: 58 ug/mL (ref 50–100)

## 2017-01-01 LAB — AMMONIA: Ammonia: 51 ug/dL (ref 27–102)

## 2017-01-01 NOTE — Telephone Encounter (Signed)
[facility] w/ lab results. Was transferred 4 times before asking for facility fax #. Results faxed to F # [phone].

## 2017-01-01 NOTE — Telephone Encounter (Signed)
-----   Message from Ward Givens, NP sent at 01/01/2017  4:25 PM EDT ----- Lab work is relatively unremarkable. Please call facility with results.

## 2017-01-02 DIAGNOSIS — R262 Difficulty in walking, not elsewhere classified: Secondary | ICD-10-CM | POA: Diagnosis not present

## 2017-01-02 DIAGNOSIS — I129 Hypertensive chronic kidney disease with stage 1 through stage 4 chronic kidney disease, or unspecified chronic kidney disease: Secondary | ICD-10-CM | POA: Diagnosis not present

## 2017-01-02 DIAGNOSIS — M6281 Muscle weakness (generalized): Secondary | ICD-10-CM | POA: Diagnosis not present

## 2017-01-03 DIAGNOSIS — R262 Difficulty in walking, not elsewhere classified: Secondary | ICD-10-CM | POA: Diagnosis not present

## 2017-01-03 DIAGNOSIS — I129 Hypertensive chronic kidney disease with stage 1 through stage 4 chronic kidney disease, or unspecified chronic kidney disease: Secondary | ICD-10-CM | POA: Diagnosis not present

## 2017-01-03 DIAGNOSIS — M6281 Muscle weakness (generalized): Secondary | ICD-10-CM | POA: Diagnosis not present

## 2017-01-05 DIAGNOSIS — M6281 Muscle weakness (generalized): Secondary | ICD-10-CM | POA: Diagnosis not present

## 2017-01-05 DIAGNOSIS — R262 Difficulty in walking, not elsewhere classified: Secondary | ICD-10-CM | POA: Diagnosis not present

## 2017-01-05 DIAGNOSIS — I129 Hypertensive chronic kidney disease with stage 1 through stage 4 chronic kidney disease, or unspecified chronic kidney disease: Secondary | ICD-10-CM | POA: Diagnosis not present

## 2017-01-07 DIAGNOSIS — M6281 Muscle weakness (generalized): Secondary | ICD-10-CM | POA: Diagnosis not present

## 2017-01-07 DIAGNOSIS — R262 Difficulty in walking, not elsewhere classified: Secondary | ICD-10-CM | POA: Diagnosis not present

## 2017-01-07 DIAGNOSIS — I129 Hypertensive chronic kidney disease with stage 1 through stage 4 chronic kidney disease, or unspecified chronic kidney disease: Secondary | ICD-10-CM | POA: Diagnosis not present

## 2017-01-08 DIAGNOSIS — I129 Hypertensive chronic kidney disease with stage 1 through stage 4 chronic kidney disease, or unspecified chronic kidney disease: Secondary | ICD-10-CM | POA: Diagnosis not present

## 2017-01-08 DIAGNOSIS — R262 Difficulty in walking, not elsewhere classified: Secondary | ICD-10-CM | POA: Diagnosis not present

## 2017-01-08 DIAGNOSIS — M6281 Muscle weakness (generalized): Secondary | ICD-10-CM | POA: Diagnosis not present

## 2017-01-09 DIAGNOSIS — R262 Difficulty in walking, not elsewhere classified: Secondary | ICD-10-CM | POA: Diagnosis not present

## 2017-01-09 DIAGNOSIS — M6281 Muscle weakness (generalized): Secondary | ICD-10-CM | POA: Diagnosis not present

## 2017-01-09 DIAGNOSIS — I129 Hypertensive chronic kidney disease with stage 1 through stage 4 chronic kidney disease, or unspecified chronic kidney disease: Secondary | ICD-10-CM | POA: Diagnosis not present

## 2017-01-10 DIAGNOSIS — R262 Difficulty in walking, not elsewhere classified: Secondary | ICD-10-CM | POA: Diagnosis not present

## 2017-01-10 DIAGNOSIS — I129 Hypertensive chronic kidney disease with stage 1 through stage 4 chronic kidney disease, or unspecified chronic kidney disease: Secondary | ICD-10-CM | POA: Diagnosis not present

## 2017-01-10 DIAGNOSIS — M6281 Muscle weakness (generalized): Secondary | ICD-10-CM | POA: Diagnosis not present

## 2017-01-12 DIAGNOSIS — M6281 Muscle weakness (generalized): Secondary | ICD-10-CM | POA: Diagnosis not present

## 2017-01-12 DIAGNOSIS — I129 Hypertensive chronic kidney disease with stage 1 through stage 4 chronic kidney disease, or unspecified chronic kidney disease: Secondary | ICD-10-CM | POA: Diagnosis not present

## 2017-01-12 DIAGNOSIS — R262 Difficulty in walking, not elsewhere classified: Secondary | ICD-10-CM | POA: Diagnosis not present

## 2017-01-13 DIAGNOSIS — M6281 Muscle weakness (generalized): Secondary | ICD-10-CM | POA: Diagnosis not present

## 2017-01-13 DIAGNOSIS — I129 Hypertensive chronic kidney disease with stage 1 through stage 4 chronic kidney disease, or unspecified chronic kidney disease: Secondary | ICD-10-CM | POA: Diagnosis not present

## 2017-01-13 DIAGNOSIS — R262 Difficulty in walking, not elsewhere classified: Secondary | ICD-10-CM | POA: Diagnosis not present

## 2017-01-15 DIAGNOSIS — R262 Difficulty in walking, not elsewhere classified: Secondary | ICD-10-CM | POA: Diagnosis not present

## 2017-01-15 DIAGNOSIS — I129 Hypertensive chronic kidney disease with stage 1 through stage 4 chronic kidney disease, or unspecified chronic kidney disease: Secondary | ICD-10-CM | POA: Diagnosis not present

## 2017-01-15 DIAGNOSIS — M6281 Muscle weakness (generalized): Secondary | ICD-10-CM | POA: Diagnosis not present

## 2017-01-16 ENCOUNTER — Encounter: Payer: Self-pay | Admitting: Nurse Practitioner

## 2017-01-16 ENCOUNTER — Non-Acute Institutional Stay (SKILLED_NURSING_FACILITY): Payer: Medicare Other | Admitting: Nurse Practitioner

## 2017-01-16 DIAGNOSIS — I129 Hypertensive chronic kidney disease with stage 1 through stage 4 chronic kidney disease, or unspecified chronic kidney disease: Secondary | ICD-10-CM | POA: Diagnosis not present

## 2017-01-16 DIAGNOSIS — E0865 Diabetes mellitus due to underlying condition with hyperglycemia: Secondary | ICD-10-CM

## 2017-01-16 DIAGNOSIS — F39 Unspecified mood [affective] disorder: Secondary | ICD-10-CM | POA: Diagnosis not present

## 2017-01-16 DIAGNOSIS — N182 Chronic kidney disease, stage 2 (mild): Secondary | ICD-10-CM | POA: Diagnosis not present

## 2017-01-16 DIAGNOSIS — Z87898 Personal history of other specified conditions: Secondary | ICD-10-CM | POA: Diagnosis not present

## 2017-01-16 DIAGNOSIS — D509 Iron deficiency anemia, unspecified: Secondary | ICD-10-CM | POA: Diagnosis not present

## 2017-01-16 DIAGNOSIS — M6281 Muscle weakness (generalized): Secondary | ICD-10-CM | POA: Diagnosis not present

## 2017-01-16 DIAGNOSIS — IMO0002 Reserved for concepts with insufficient information to code with codable children: Secondary | ICD-10-CM

## 2017-01-16 DIAGNOSIS — F29 Unspecified psychosis not due to a substance or known physiological condition: Secondary | ICD-10-CM

## 2017-01-16 DIAGNOSIS — I10 Essential (primary) hypertension: Secondary | ICD-10-CM | POA: Diagnosis not present

## 2017-01-16 DIAGNOSIS — Z794 Long term (current) use of insulin: Secondary | ICD-10-CM

## 2017-01-16 DIAGNOSIS — R262 Difficulty in walking, not elsewhere classified: Secondary | ICD-10-CM | POA: Diagnosis not present

## 2017-01-16 DIAGNOSIS — R569 Unspecified convulsions: Secondary | ICD-10-CM | POA: Diagnosis not present

## 2017-01-16 DIAGNOSIS — F1011 Alcohol abuse, in remission: Secondary | ICD-10-CM

## 2017-01-16 DIAGNOSIS — E0822 Diabetes mellitus due to underlying condition with diabetic chronic kidney disease: Secondary | ICD-10-CM

## 2017-01-16 DIAGNOSIS — E119 Type 2 diabetes mellitus without complications: Secondary | ICD-10-CM | POA: Diagnosis not present

## 2017-01-16 NOTE — Progress Notes (Signed)
Nursing Home Location:  [facility]  Place of Service: SNF (31)  PCP: [name], MD  No Known Allergies  Chief Complaint  Patient presents with  . Medical Management of Chronic Issues    Routine Visit    HPI:  Patient is a 71 y.o. male seen today at [facility] for routine follow up. Pt with hx of ETOH abuse, DM, HTN, seizures, anemia, depression. He has no new complaints today. Pt reports he is working with therapy and getting stronger and more mobile. Working with them has helped his arthritis as well.  Pt has had significant weight gain due to calorie intake and blood sugars not controlled 200-400 throughout the day.no hypoglycemic episodes.    Review of Systems: Review of Systems  Constitutional: Negative for activity change, appetite change, chills, diaphoresis, fatigue, fever and unexpected weight change.  HENT: Negative for congestion, rhinorrhea and sore throat.   Eyes: Negative for visual disturbance.  Respiratory: Negative for wheezing. Negative for cough, chest tightness and shortness of breath.   Cardiovascular: Negative for chest pain, palpitations and leg swelling.  Gastrointestinal: Negative for abdominal distention, abdominal pain, constipation, diarrhea, nausea and vomiting.  Genitourinary: Positive for urgency. Negative for difficulty urinating, dysuria and flank pain.  Musculoskeletal: Negative for arthralgias and back pain.       Ambulates with wheelchair assistance  Skin: Negative for color change and wound.   Neurological: Negative for dizziness, seizures and facial asymmetry.  Psychiatric/Behavioral: Positive for confusion. Negative for agitation, behavioral problems and sleep disturbance.    Past Medical History:  Diagnosis Date  . Abnormality of gait 01/14/2016  . Alcohol abuse 2009   dependency as exhibited by ETOH withdrawal seizure.   . Alcohol withdrawal seizure ([place]) 2013  . Anemia 2011   Normocytic.   . Cataract   .  Chronic renal insufficiency, stage III (moderate) 02/26/2014  . Depression with anxiety   . Hearing impairment   . HTN (hypertension), benign 02/26/2014  . Hyperlipemia   . Hypertension   . Seizures ([place])   . Type II or unspecified type diabetes mellitus with neurological manifestations, not stated as uncontrolled(250.60) 02/26/2014   Past Surgical History:  Procedure Laterality Date  . NO PAST SURGERIES     Social History:   reports that he has quit smoking. His smoking use included Cigars. His smokeless tobacco use includes Chew. He reports that he does not drink alcohol or use drugs.  Family History  Problem Relation Age of Onset  . Family history unknown: Yes    Medications: Patient's Medications  New Prescriptions   No medications on file  Previous Medications   ACETAMINOPHEN (TYLENOL) 500 MG TABLET    Take 2 tablets by mouth every morning and every evening for pain.   AMLODIPINE (NORVASC) 5 MG TABLET    Take 5 mg by mouth daily.    ASPIRIN (ASPIRIN EC) 81 MG EC TABLET    Take 81 mg by mouth daily. Swallow whole.   FERROUS SULFATE 325 (65 FE) MG TABLET    Take 1 tablet (325 mg total) by mouth daily with breakfast.   FOLIC ACID (FOLVITE) 1 MG TABLET    Take 1 tablet (1 mg total) by mouth daily.   GLIPIZIDE (GLUCOTROL) 5 MG TABLET    Take 5 mg by mouth daily before breakfast.    HYDROCORTISONE CREAM 0.5 %    Apply 1 application topically 2 (two) times daily.   INSULIN ASPART (NOVOLOG) 100 UNIT/ML INJECTION  Give 10 units SQ with meals. Call MD if CBG >400.   INSULIN GLARGINE (LANTUS) 100 UNIT/ML INJECTION    Inject 35 Units into the skin at bedtime.    PRAVASTATIN (PRAVACHOL) 10 MG TABLET    Take 10 mg by mouth at bedtime.    PROPYLENE GLYCOL (SYSTANE BALANCE OP)    One drop in each eye daily for dry eye   QUETIAPINE (SEROQUEL) 100 MG TABLET    Take 100 mg by mouth 2 (two) times daily.    SERTRALINE (ZOLOFT) 50 MG TABLET    Take 50 mg by mouth daily.   THIAMINE (VITAMIN B-1)  100 MG TABLET    Take 100 mg by mouth daily.   VALPROIC ACID (DEPAKENE) 250 MG/5ML SYRUP    Take 10 mLs (500 mg total) by mouth 3 (three) times daily.  Modified Medications   No medications on file  Discontinued Medications   No medications on file     Physical Exam: Vitals:   01/16/17 1123  BP: 128/66  Pulse: 74  Resp: 20  Temp: 98.3 F (36.8 C)  TempSrc: Oral  SpO2: 98%  Weight: 243 lb 9.6 oz (110.5 kg)  Height: 5\' 7"  (1.702 m)    Physical Exam  Constitutional: He appears well-developed and well-nourished. No distress.  Smells of urine  HENT:  Head: Normocephalic and atraumatic.  Right Ear: External ear normal.  Left Ear: External ear normal.  Nose: Nose normal.  Mouth/Throat: Oropharynx is clear and moist. No oropharyngeal exudate.  Edentulous. Very HOH Large amount of smokeless tobacco in mouth  Eyes: Conjunctivae and EOM are normal. Pupils are equal, round, and reactive to light. Right eye exhibits no discharge. Left eye exhibits no discharge.  Neck: Normal range of motion. Neck supple.  Cardiovascular: Normal rate, regular rhythm and normal heart sounds.   No murmur heard. BLE edema +1  Pulmonary/Chest: Effort normal and breath sounds normal. No respiratory distress. He has no wheezes. He has no rales.  Abdominal: Soft. Bowel sounds are normal. He exhibits distension. (rounded abdomen)There is no tenderness. There is no guarding.  Genitourinary:  Genitourinary Comments: incontinent of urine  Musculoskeletal: He exhibits edema.  Trace edema Neurological: He is alert.  Oriented to person, and place only  Skin: Skin is warm and dry. He is not diaphoretic.  Psychiatric: He has a normal mood and affect. His behavior is normal.    Labs reviewed: Basic Metabolic Panel:  Recent Labs  08/02/16 11/24/16 12/31/16 0910  NA 135* 142 138  K 4.9 5.0 5.0  CL  --   --  97  CO2  --   --  22  GLUCOSE  --   --  190*  BUN 26* 23* 22  CREATININE 1.5* 1.3 1.54*  CALCIUM   --   --  9.6   Liver Function Tests:  Recent Labs  11/24/16 12/31/16 0910  AST 8* 12  ALT 7* 11  ALKPHOS 65 65  BILITOT  --  0.2  PROT  --  7.2  ALBUMIN  --  3.9   No results for input(s): LIPASE, AMYLASE in the last 8760 hours.  Recent Labs  12/31/16 0910  AMMONIA 51   CBC:  Recent Labs  05/09/16 11/24/16 12/31/16 0910  WBC 5.0 5.9 6.5  NEUTROABS  --   --  3.9  HGB 12.6* 13.9  --   HCT 38* 44 39.1  MCV  --   --  93  PLT 154 185 187  TSH: No results for input(s): TSH in the last 8760 hours. A1C: Lab Results  Component Value Date   HGBA1C 7.2 11/24/2016   Lipid Panel:  Recent Labs  02/01/16 05/09/16  CHOL 145 159  HDL 29* 33*  LDLCALC 65 72  TRIG 253* 269*    Assessment/Plan 1. Essential hypertension Blood pressure controlled on novasc; will cont current regimen.   2. Diabetes mellitus due to underlying condition, uncontrolled, with stage 2 chronic kidney disease, with long-term current use of insulin (HCC) Blood sugars have significantly increased in the last month. Will increase glipizide to 5 mg PO BID, to cont lantus and novolog. diabetic diet.   3. Psychosis, unspecified psychosis type Mood has been stable. Without increase in agitation noted. Will cont seroquel 100 mg BID at this time. Psych cont to follow up  4. History of alcohol abuse conts on folate and thiamine.   5. Iron deficiency anemia, unspecified iron deficiency anemia type Stable, will cont iron supplements.    William [name]. Harle Battiest  [facility] [phone] 8 am - 5 pm) [phone] (after hours)

## 2017-01-17 DIAGNOSIS — I129 Hypertensive chronic kidney disease with stage 1 through stage 4 chronic kidney disease, or unspecified chronic kidney disease: Secondary | ICD-10-CM | POA: Diagnosis not present

## 2017-01-17 DIAGNOSIS — R262 Difficulty in walking, not elsewhere classified: Secondary | ICD-10-CM | POA: Diagnosis not present

## 2017-01-17 DIAGNOSIS — M6281 Muscle weakness (generalized): Secondary | ICD-10-CM | POA: Diagnosis not present

## 2017-01-19 DIAGNOSIS — I129 Hypertensive chronic kidney disease with stage 1 through stage 4 chronic kidney disease, or unspecified chronic kidney disease: Secondary | ICD-10-CM | POA: Diagnosis not present

## 2017-01-19 DIAGNOSIS — M6281 Muscle weakness (generalized): Secondary | ICD-10-CM | POA: Diagnosis not present

## 2017-01-19 DIAGNOSIS — R262 Difficulty in walking, not elsewhere classified: Secondary | ICD-10-CM | POA: Diagnosis not present

## 2017-01-20 DIAGNOSIS — R262 Difficulty in walking, not elsewhere classified: Secondary | ICD-10-CM | POA: Diagnosis not present

## 2017-01-20 DIAGNOSIS — M6281 Muscle weakness (generalized): Secondary | ICD-10-CM | POA: Diagnosis not present

## 2017-01-20 DIAGNOSIS — I129 Hypertensive chronic kidney disease with stage 1 through stage 4 chronic kidney disease, or unspecified chronic kidney disease: Secondary | ICD-10-CM | POA: Diagnosis not present

## 2017-01-21 DIAGNOSIS — I129 Hypertensive chronic kidney disease with stage 1 through stage 4 chronic kidney disease, or unspecified chronic kidney disease: Secondary | ICD-10-CM | POA: Diagnosis not present

## 2017-01-21 DIAGNOSIS — M6281 Muscle weakness (generalized): Secondary | ICD-10-CM | POA: Diagnosis not present

## 2017-01-21 DIAGNOSIS — R262 Difficulty in walking, not elsewhere classified: Secondary | ICD-10-CM | POA: Diagnosis not present

## 2017-01-22 ENCOUNTER — Non-Acute Institutional Stay (SKILLED_NURSING_FACILITY): Payer: Medicare Other | Admitting: Internal Medicine

## 2017-01-22 ENCOUNTER — Encounter: Payer: Self-pay | Admitting: Internal Medicine

## 2017-01-22 DIAGNOSIS — Z794 Long term (current) use of insulin: Secondary | ICD-10-CM

## 2017-01-22 DIAGNOSIS — E0865 Diabetes mellitus due to underlying condition with hyperglycemia: Secondary | ICD-10-CM

## 2017-01-22 DIAGNOSIS — E0822 Diabetes mellitus due to underlying condition with diabetic chronic kidney disease: Secondary | ICD-10-CM | POA: Diagnosis not present

## 2017-01-22 DIAGNOSIS — M6281 Muscle weakness (generalized): Secondary | ICD-10-CM | POA: Diagnosis not present

## 2017-01-22 DIAGNOSIS — N182 Chronic kidney disease, stage 2 (mild): Secondary | ICD-10-CM

## 2017-01-22 DIAGNOSIS — I129 Hypertensive chronic kidney disease with stage 1 through stage 4 chronic kidney disease, or unspecified chronic kidney disease: Secondary | ICD-10-CM | POA: Diagnosis not present

## 2017-01-22 DIAGNOSIS — R262 Difficulty in walking, not elsewhere classified: Secondary | ICD-10-CM | POA: Diagnosis not present

## 2017-01-22 DIAGNOSIS — IMO0002 Reserved for concepts with insufficient information to code with codable children: Secondary | ICD-10-CM

## 2017-01-22 NOTE — Assessment & Plan Note (Signed)
He qualifies for diabetic shoes based on peripheral  vascular disease, toe and nail deformities

## 2017-01-22 NOTE — Progress Notes (Signed)
   [facility] Room:113B  This is a nursing facility follow up for specific acute issue of diabetic foot evaluation as possible prelude to diabetic shoes.  Interim medical record and care since last [place] visit was updated with review of diagnostic studies and change in clinical status since last visit were documented.  HPI: The patient's most recent A1c was 7.2% on 11/24/16. Despite this excellent value; his insulin doses pre-meal and basal have had to be adjusted due to dietary noncompliance. Specifically at bedside he constantly has ice cream, Pepsi-Cola, Tootsie Roll pops,hard  candy, and other hyper glycemic carb products. When asked about this he states that "I'm hungry".  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  Physical exam:  Pertinent or positive findings: He appears disheveled. Excoriations are present periorally. Ptosis is present on the left. He is edentulous. Scattered low-grade x-ray rhonchi and expiratory wheezes are noted. Heart sounds are distant. Abdomen is massively protuberant. 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 Nails are deformed & toe contractures present. Sensory exam was difficult due to deafness. Sensation over the ventral feet clinically  grossly appeared intact. Pedal pulses decreased.  General appearance:Adequately nourished; no acute distress , increased work of breathing is present despite abnormal breath sounds.   Lymphatic: No lymphadenopathy about the head, neck, axilla . Eyes: No conjunctival inflammation or lid edema is present. There is no scleral icterus. Ears:  External ear exam shows no significant lesions or deformities.   Neck:  No thyromegaly, masses, tenderness noted.    Heart:  Some respiratory variation in rate and . S1 and S2 normal without gallop, murmur, click, rub .  Abdomen:Bowel sounds are normal. Abdomen is soft and  nontender with no organomegaly, hernias,masses. GU: deferred  Extremities:  No cyanosis, clubbing,edema  Skin: Warm & dry w/o tenting. No significant rash.  #1 poorly controlled DM due to dietary non compliance with documented indications for therapeutic shoes Basal insulin dose adjusted

## 2017-01-22 NOTE — Patient Instructions (Addendum)
Form for diabetic shoes will be completed Basal insulin adjusted Dietary compliance discussion literally  "fell on deaf ears"

## 2017-01-23 DIAGNOSIS — I129 Hypertensive chronic kidney disease with stage 1 through stage 4 chronic kidney disease, or unspecified chronic kidney disease: Secondary | ICD-10-CM | POA: Diagnosis not present

## 2017-01-23 DIAGNOSIS — M6281 Muscle weakness (generalized): Secondary | ICD-10-CM | POA: Diagnosis not present

## 2017-01-23 DIAGNOSIS — R262 Difficulty in walking, not elsewhere classified: Secondary | ICD-10-CM | POA: Diagnosis not present

## 2017-02-13 ENCOUNTER — Encounter: Payer: Self-pay | Admitting: Nurse Practitioner

## 2017-02-13 ENCOUNTER — Non-Acute Institutional Stay (SKILLED_NURSING_FACILITY): Payer: Medicare Other | Admitting: Nurse Practitioner

## 2017-02-13 DIAGNOSIS — R0609 Other forms of dyspnea: Secondary | ICD-10-CM | POA: Diagnosis not present

## 2017-02-13 DIAGNOSIS — E0865 Diabetes mellitus due to underlying condition with hyperglycemia: Secondary | ICD-10-CM | POA: Diagnosis not present

## 2017-02-13 DIAGNOSIS — IMO0002 Reserved for concepts with insufficient information to code with codable children: Secondary | ICD-10-CM

## 2017-02-13 DIAGNOSIS — F419 Anxiety disorder, unspecified: Secondary | ICD-10-CM | POA: Diagnosis not present

## 2017-02-13 DIAGNOSIS — N182 Chronic kidney disease, stage 2 (mild): Secondary | ICD-10-CM

## 2017-02-13 DIAGNOSIS — R05 Cough: Secondary | ICD-10-CM

## 2017-02-13 DIAGNOSIS — E0822 Diabetes mellitus due to underlying condition with diabetic chronic kidney disease: Secondary | ICD-10-CM | POA: Diagnosis not present

## 2017-02-13 DIAGNOSIS — F329 Major depressive disorder, single episode, unspecified: Secondary | ICD-10-CM

## 2017-02-13 DIAGNOSIS — F29 Unspecified psychosis not due to a substance or known physiological condition: Secondary | ICD-10-CM

## 2017-02-13 DIAGNOSIS — I10 Essential (primary) hypertension: Secondary | ICD-10-CM | POA: Diagnosis not present

## 2017-02-13 DIAGNOSIS — Z794 Long term (current) use of insulin: Secondary | ICD-10-CM

## 2017-02-13 DIAGNOSIS — J449 Chronic obstructive pulmonary disease, unspecified: Secondary | ICD-10-CM

## 2017-02-13 DIAGNOSIS — R059 Cough, unspecified: Secondary | ICD-10-CM

## 2017-02-13 DIAGNOSIS — R569 Unspecified convulsions: Secondary | ICD-10-CM | POA: Diagnosis not present

## 2017-02-13 DIAGNOSIS — R062 Wheezing: Secondary | ICD-10-CM | POA: Diagnosis not present

## 2017-02-13 NOTE — Progress Notes (Signed)
Nursing Home Location:  [facility] Room: Start of Service: SNF (31)  PCP: Hendricks Limes, MD  No Known Allergies  Chief Complaint  Patient presents with  . Medical Management of Chronic Issues    Resident is being seen for a routine visit.     HPI:  Patient is a 71 y.o. male seen today at [facility] for routine follow up. Pt with hx of ETOH abuse, DM, HTN, seizures, anemia, depression. Pt reports increase in cough with shortness of breath on exertion. Uses snuff routinely and hx of tobacco abuse.  Reports increase fatigue but no fevers or chills. Denies shortness of breath at this time. No noted seizures per staff.  Pt noncompliant with diet. No hypoglycemic episodes.   Review of Systems:   Review of Systems  Constitutional: Negative for chills, fever, malaise/fatigue and weight loss.  HENT: Negative for tinnitus.   Respiratory: Positive for cough. Negative for sputum production, shortness of breath and wheezing.   Cardiovascular: Negative for chest pain, palpitations and leg swelling.  Gastrointestinal: Negative for abdominal pain, constipation, diarrhea and heartburn.  Genitourinary: Positive for frequency. Negative for dysuria and urgency.  Musculoskeletal: Negative for back pain, falls, joint pain and myalgias.  Skin: Negative.   Neurological: Negative for dizziness, weakness and headaches.  Psychiatric/Behavioral: Negative for depression and memory loss. The patient does not have insomnia.       Past Medical History:  Diagnosis Date  . Abnormality of gait 01/14/2016  . Alcohol abuse 2009   dependency as exhibited by ETOH withdrawal seizure.   . Alcohol withdrawal seizure ([place]) 2013  . Anemia 2011   Normocytic.   . Cataract   . Chronic renal insufficiency, stage III (moderate) 02/26/2014  . Depression with anxiety   . Hearing impairment   . HTN (hypertension), benign 02/26/2014  . Hyperlipemia   . Hypertension   . Seizures  ([place])   . Type II or unspecified type diabetes mellitus with neurological manifestations, not stated as uncontrolled(250.60) 02/26/2014   Past Surgical History:  Procedure Laterality Date  . NO PAST SURGERIES     Social History:   reports that he has quit smoking. His smoking use included Cigars. His smokeless tobacco use includes Chew. He reports that he does not drink alcohol or use drugs.  Family History  Problem Relation Age of Onset  . Family history unknown: Yes    Medications: Patient's Medications  New Prescriptions   No medications on file  Previous Medications   ACETAMINOPHEN (TYLENOL) 500 MG TABLET    Take 2 tablets by mouth every morning and every evening for pain.   AMLODIPINE (NORVASC) 5 MG TABLET    Take 5 mg by mouth daily.    ASPIRIN (ASPIRIN EC) 81 MG EC TABLET    Take 81 mg by mouth daily. Swallow whole.   FERROUS SULFATE 325 (65 FE) MG TABLET    Take 1 tablet (325 mg total) by mouth daily with breakfast.   FOLIC ACID (FOLVITE) 1 MG TABLET    Take 1 tablet (1 mg total) by mouth daily.   GLIPIZIDE (GLUCOTROL) 5 MG TABLET    Take 5 mg by mouth daily before breakfast.    HYDROCORTISONE CREAM 1 %    Apply 1 application topically 2 (two) times daily.   INSULIN ASPART (NOVOLOG) 100 UNIT/ML INJECTION    Give 10 units SQ with meals. Call MD if CBG >400.   INSULIN GLARGINE (LANTUS) 100 UNIT/ML  INJECTION    Inject 45 Units into the skin at bedtime.    LISINOPRIL (PRINIVIL,ZESTRIL) 5 MG TABLET    Take 5 mg by mouth daily.   PRAVASTATIN (PRAVACHOL) 10 MG TABLET    Take 10 mg by mouth at bedtime.    PROPYLENE GLYCOL (SYSTANE BALANCE OP)    One drop in each eye daily for dry eye   QUETIAPINE (SEROQUEL) 100 MG TABLET    Take 100 mg by mouth 2 (two) times daily.    SERTRALINE (ZOLOFT) 50 MG TABLET    Take 50 mg by mouth daily.   THIAMINE (VITAMIN B-1) 100 MG TABLET    Take 100 mg by mouth daily.   VALPROIC ACID (DEPAKENE) 250 MG/5ML SYRUP    Take 10 mLs (500 mg total) by mouth 3  (three) times daily.  Modified Medications   No medications on file  Discontinued Medications   HYDROCORTISONE CREAM 0.5 %    Apply 1 application topically 2 (two) times daily.     Physical Exam: Vitals:   02/13/17 1417  BP: 122/81  Pulse: 82  Resp: 20  Temp: 98.1 F (36.7 C)  SpO2: 98%  Weight: 247 lb (112 kg)  Height: 5\' 7"  (1.702 m)     Physical Exam  Constitutional: He is well-developed, well-nourished, and in no distress.  Smells of urine  HENT:  Snuff in mouth  Neck: Normal range of motion. Neck supple.  Cardiovascular: Normal rate, regular rhythm and normal heart sounds.   Pulmonary/Chest: Effort normal. He has wheezes.  Abdominal: Soft. Bowel sounds are normal. He exhibits distension (soft). There is no tenderness.  Musculoskeletal: He exhibits edema (trace bilaterally).  Neurological: He is alert.  Oriented to person and place  Skin: Skin is warm and dry.  Psychiatric: Affect normal.  Oriented to person and place      Labs reviewed: Basic Metabolic Panel:  Recent Labs  08/02/16 11/24/16 12/31/16 0910  NA 135* 142 138  K 4.9 5.0 5.0  CL  --   --  97  CO2  --   --  22  GLUCOSE  --   --  190*  BUN 26* 23* 22  CREATININE 1.5* 1.3 1.54*  CALCIUM  --   --  9.6   Liver Function Tests:  Recent Labs  11/24/16 12/31/16 0910  AST 8* 12  ALT 7* 11  ALKPHOS 65 65  BILITOT  --  0.2  PROT  --  7.2  ALBUMIN  --  3.9   No results for input(s): LIPASE, AMYLASE in the last 8760 hours.  Recent Labs  12/31/16 0910  AMMONIA 51   CBC:  Recent Labs  05/09/16 11/24/16 12/31/16 0910  WBC 5.0 5.9 6.5  NEUTROABS  --   --  3.9  HGB 12.6* 13.9  --   HCT 38* 44 39.1  MCV  --   --  93  PLT 154 185 187   TSH: No results for input(s): TSH in the last 8760 hours. A1C: Lab Results  Component Value Date   HGBA1C 7.2 11/24/2016   Lipid Panel:  Recent Labs  05/09/16  CHOL 159  HDL 33*  LDLCALC 72  TRIG 269*    Assessment/Plan 1. Diabetes  mellitus due to underlying condition, uncontrolled, with stage 2 chronic kidney disease, with long-term current use of insulin (HCC) Blood sugars ranging from 110-300, will follow up A1c at this time. No hypoglycemic episodes noted   2. Essential hypertension Blood pressure stable;  conts on norvasc and lisinopril.   3. Cough  Pt reports increase cough with dyspnea on exertion. O2 sats stable at this time. Wheezing noted on exam however denies shortness of breath at this time. Will get chest xray at this time.   4. Psychosis, unspecified psychosis type Behaviors have been stable. Will cont current regimen.  5. Seizure ([place]) No seizures noted. conts on Depakote.   6. Anxiety and depression Stable at this time. Will cont on zoloft  Celines Femia K. Harle Battiest  [facility] [phone] 8 am - 5 pm) [phone] (after hours)

## 2017-02-17 ENCOUNTER — Encounter: Payer: Self-pay | Admitting: Internal Medicine

## 2017-02-17 ENCOUNTER — Non-Acute Institutional Stay (SKILLED_NURSING_FACILITY): Payer: Medicare Other | Admitting: Internal Medicine

## 2017-02-17 DIAGNOSIS — L03011 Cellulitis of right finger: Secondary | ICD-10-CM

## 2017-02-17 DIAGNOSIS — J449 Chronic obstructive pulmonary disease, unspecified: Secondary | ICD-10-CM | POA: Diagnosis not present

## 2017-02-17 DIAGNOSIS — D649 Anemia, unspecified: Secondary | ICD-10-CM | POA: Diagnosis not present

## 2017-02-17 DIAGNOSIS — D509 Iron deficiency anemia, unspecified: Secondary | ICD-10-CM | POA: Diagnosis not present

## 2017-02-17 DIAGNOSIS — N183 Chronic kidney disease, stage 3 (moderate): Secondary | ICD-10-CM | POA: Diagnosis not present

## 2017-02-17 LAB — CBC AND DIFFERENTIAL
HEMATOCRIT: 41 % (ref 41–53)
Hemoglobin: 13.4 g/dL — AB (ref 13.5–17.5)
PLATELETS: 168 10*3/uL (ref 150–399)
WBC: 5.5 10*3/mL

## 2017-02-17 NOTE — Progress Notes (Signed)
    This is a nursing facility follow up for specific acute issue of cough & congestion as per COC submitted by staff.  Interim medical record and care since last [place] visit was updated with review of diagnostic studies and change in clinical status since last visit were documented.  HPI: According to staff he exhibited cough and congestion with oxygen saturations of 74% on room air 5/18. Nasal oxygen was refused,with a mask O2 oxygen saturations rose to 92-96 percent. The facemask was subsequently removed and oxygen saturations remained stable. Chest x-ray 5/18 revealed no active process When I interviewed the patient today he had no complaints concerning his lungs initially. His main concern was a hangnail of third right toe which was "snagging my sock" Further questioning revealed that he has had some wheezing with some intermittent white sputum production. He has no other upper or lower respiratory tract or constitutional symptoms.  Review of systems: Dementia invalidated responses. Date given as 02/14/1918. The bedside table was crowded with candies, salted snacks, and a saltshaker. When I began to discuss the negative impact on this in relationship to his cardiopulmonary and diabetes his reply was "I know"  Constitutional: No fever,significant weight change, fatigue  Eyes: No redness, discharge, pain, vision change ENT/mouth: No nasal congestion,  purulent discharge, earache,change in hearing ,sore throat  Cardiovascular: No chest pain, palpitations,paroxysmal nocturnal dyspnea, claudication, edema  Respiratory: No hemoptysis Gastrointestinal: No heartburn,dysphagia,abdominal pain, nausea / vomiting,rectal bleeding, melena,change in bowels Genitourinary: No dysuria,hematuria, pyuria,  incontinence, nocturia Hematologic/lymphatic: No significant bruising, lymphadenopathy,abnormal bleeding Allergy/immunology: No itchy/ watery eyes, significant sneezing, urticaria,  angioedema  Physical exam:  Pertinent or positive findings: He appears disheveled and unkempt. He has seborrheic dermatitis particularly over the lower face. He is edentulous. He is chewing tobacco.  Gynecomastia is present. He has scattered rhonchi and wheezing on expiration. Heart sounds are distant. Abdomen is protuberant.Pedal pulses are decreased but palpable. He has flexion contractures of the toes. There is an irregular sharp third right toenail. With stimulation of the foot the great toe is upgoing.   General appearance:Adequately nourished; no acute distress , increased work of breathing is present.   Lymphatic: No lymphadenopathy about the head, neck, axilla . Eyes: No conjunctival inflammation or lid edema is present. There is no scleral icterus. Ears:  External ear exam shows no significant lesions or deformities.   Nose:  External nasal examination shows no deformity or inflammation. Nasal mucosa are pink and moist without lesions ,exudates Oral exam: There is no oropharyngeal erythema or exudate . Neck:  No thyromegaly, masses, tenderness noted.    Heart:  No gallop, murmur, click, rub .  Abdomen:Bowel sounds are normal. Abdomen is soft and nontender with no organomegaly, hernias,masses. GU: deferred  Extremities:  No cyanosis, clubbing,edema  Skin: Warm & dry w/o tenting. No significant lesions or rash.  See summary under each active problem in the Problem List with associated updated therapeutic plan

## 2017-02-18 NOTE — Patient Instructions (Signed)
See assessment and plan under each diagnosis in the problem list and acutely for this visit 

## 2017-02-19 DIAGNOSIS — E119 Type 2 diabetes mellitus without complications: Secondary | ICD-10-CM | POA: Diagnosis not present

## 2017-02-19 LAB — HEMOGLOBIN A1C: Hemoglobin A1C: 7.9

## 2017-03-13 ENCOUNTER — Non-Acute Institutional Stay (SKILLED_NURSING_FACILITY): Payer: Medicare Other | Admitting: Adult Health

## 2017-03-13 ENCOUNTER — Encounter: Payer: Self-pay | Admitting: Adult Health

## 2017-03-13 DIAGNOSIS — I10 Essential (primary) hypertension: Secondary | ICD-10-CM

## 2017-03-13 DIAGNOSIS — E785 Hyperlipidemia, unspecified: Secondary | ICD-10-CM | POA: Diagnosis not present

## 2017-03-13 DIAGNOSIS — D509 Iron deficiency anemia, unspecified: Secondary | ICD-10-CM | POA: Diagnosis not present

## 2017-03-13 DIAGNOSIS — N182 Chronic kidney disease, stage 2 (mild): Secondary | ICD-10-CM | POA: Diagnosis not present

## 2017-03-13 DIAGNOSIS — E0822 Diabetes mellitus due to underlying condition with diabetic chronic kidney disease: Secondary | ICD-10-CM | POA: Diagnosis not present

## 2017-03-13 DIAGNOSIS — R300 Dysuria: Secondary | ICD-10-CM | POA: Diagnosis not present

## 2017-03-13 DIAGNOSIS — F39 Unspecified mood [affective] disorder: Secondary | ICD-10-CM | POA: Diagnosis not present

## 2017-03-13 DIAGNOSIS — F329 Major depressive disorder, single episode, unspecified: Secondary | ICD-10-CM

## 2017-03-13 DIAGNOSIS — E0865 Diabetes mellitus due to underlying condition with hyperglycemia: Secondary | ICD-10-CM

## 2017-03-13 DIAGNOSIS — F32A Depression, unspecified: Secondary | ICD-10-CM

## 2017-03-13 DIAGNOSIS — IMO0002 Reserved for concepts with insufficient information to code with codable children: Secondary | ICD-10-CM

## 2017-03-13 DIAGNOSIS — Z794 Long term (current) use of insulin: Secondary | ICD-10-CM

## 2017-03-13 NOTE — Progress Notes (Signed)
DATE:  03/13/2017   MRN:  [id number]  BIRTHDAY: [birth year]  Facility:  Nursing Home Location:  [facility] Room Number: 110-B  LEVEL OF CARE:  SNF (31)  Contact Information    Name Relation Home Work Mobile   [place] Friend [phone]     Hicks,Nancy Significant other [phone]     Chaya Jan    [phone]       Code Status History    Date Active Date Inactive Code Status Order ID Comments User Context   12/30/2015  4:39 PM 01/02/2016  6:36 PM Full Code [id number]  Collier Salina, MD Inpatient   11/20/2015  5:29 AM 11/21/2015 10:28 PM Full Code [id number]  Bethena Roys, MD ED   10/10/2015  7:59 PM 10/11/2015  3:25 PM Full Code [id number]  Harvel Quale, MD ED   05/04/2015 11:34 AM 05/07/2015  8:52 PM Full Code [id number]  Bethena Roys, MD Inpatient   02/26/2014 12:36 AM 03/01/2014  8:13 AM Full Code [id number]  Olga Millers, MD Inpatient   12/15/2013  4:39 AM 12/17/2013  9:12 PM Full Code [id number]  Charlann Lange, MD Inpatient   09/03/2012 12:44 AM 09/04/2012  6:39 PM Full Code [id number]  Mackie Pai., MD ED   08/21/2012  4:25 AM 08/22/2012  7:10 PM Full Code [id number]  Hester Mates, MD Inpatient       Chief Complaint  Patient presents with  . Medical Management of Chronic Issues    Routine    HISTORY OF PRESENT ILLNESS:  This is a 71-YO male seen for a routine visit.  He is a long-term care resident at [facility] and [place]. He has PMH of alcohol abuse, alcohol withdrawal seizure, anemia, cataract, chronic renal insufficiency stage III, depression, hearing impairment, hypertension, hyperlipidemia, anemia, hypertension and diabetes mellitus type 2.He was recently treated for cough/congestion with Prednisone, Zithromax and Duoneb. He was seen in his room and was chewing tobacco while in bed. He complained about dysuria and occasional incontinence.   PAST MEDICAL HISTORY:  Past Medical History:  Diagnosis  Date  . Abnormality of gait 01/14/2016  . Alcohol abuse 2009   dependency as exhibited by ETOH withdrawal seizure.   . Alcohol withdrawal seizure ([place]) 2013  . Anemia 2011   Normocytic.   . Cataract   . Chronic renal insufficiency, stage III (moderate) 02/26/2014  . Depression with anxiety   . Hearing impairment   . HTN (hypertension), benign 02/26/2014  . Hyperlipemia   . Hypertension   . Seizures ([place])   . Type II or unspecified type diabetes mellitus with neurological manifestations, not stated as uncontrolled(250.60) 02/26/2014     CURRENT MEDICATIONS: Reviewed  Patient's Medications  New Prescriptions   No medications on file  Previous Medications   ACETAMINOPHEN (TYLENOL) 500 MG TABLET    1,000 mg every 12 (twelve) hours. Take 2 tablets by mouth every morning and every evening for pain.    AMLODIPINE (NORVASC) 5 MG TABLET    Take 5 mg by mouth daily.    ASPIRIN (ASPIRIN EC) 81 MG EC TABLET    Take 81 mg by mouth daily. Swallow whole.   FERROUS SULFATE 325 (65 FE) MG TABLET    Take 1 tablet (325 mg total) by mouth daily with breakfast.   FOLIC ACID (FOLVITE) 1 MG TABLET    Take 1 tablet (1 mg total) by mouth daily.   GLIPIZIDE (GLUCOTROL) 5 MG TABLET  Take 5 mg by mouth 2 (two) times daily with a meal.    HYDROCORTISONE CREAM 1 %    Apply 1 application topically 2 (two) times daily.   INSULIN ASPART (NOVOLOG) 100 UNIT/ML INJECTION    Give 10 units SQ with meals. Call MD if CBG >400.   INSULIN GLARGINE (LANTUS) 100 UNIT/ML INJECTION    Inject 45 Units into the skin at bedtime.    IPRATROPIUM-ALBUTEROL (DUONEB) 0.5-2.5 (3) MG/3ML SOLN    Take 3 mLs by nebulization every 6 (six) hours as needed.   LISINOPRIL (PRINIVIL,ZESTRIL) 5 MG TABLET    Take 5 mg by mouth daily.   PRAVASTATIN (PRAVACHOL) 10 MG TABLET    Take 10 mg by mouth at bedtime.    PROPYLENE GLYCOL (SYSTANE BALANCE OP)    One drop in each eye daily for dry eye   QUETIAPINE (SEROQUEL) 100 MG TABLET    Take 100 mg by  mouth 2 (two) times daily.    SERTRALINE (ZOLOFT) 50 MG TABLET    Take 50 mg by mouth daily.   THIAMINE (VITAMIN B-1) 100 MG TABLET    Take 100 mg by mouth daily.   VALPROIC ACID (DEPAKENE) 250 MG/5ML SYRUP    Take 10 mLs (500 mg total) by mouth 3 (three) times daily.  Modified Medications   No medications on file  Discontinued Medications   No medications on file     No Known Allergies   REVIEW OF SYSTEMS:  GENERAL: no change in appetite, no fatigue, no weight changes, no fever, chills or weakness EYES: Denies change in vision, dry eyes, eye pain, itching or discharge EARS: Denies change in hearing, ringing in ears, or earache NOSE: Denies nasal congestion or epistaxis MOUTH and THROAT: Denies oral discomfort, gingival pain or bleeding, pain from teeth or hoarseness   RESPIRATORY: no cough, SOB, DOE, wheezing, hemoptysis CARDIAC: no chest pain, edema or palpitations GI: no abdominal pain, diarrhea, constipation, heart burn, nausea or vomiting GU: + dysuria and incontinence PSYCHIATRIC: Denies feeling of depression or anxiety. No report of hallucinations, insomnia, paranoia, or agitation     PHYSICAL EXAMINATION  GENERAL APPEARANCE: Well nourished. In no acute distress. Obese SKIN:  Skin is warm and dry.  HEAD: Normal in size and contour. No evidence of trauma EYES: Lids open and close normally. No blepharitis, entropion or ectropion. PERRL. Conjunctivae are clear and sclerae are white. Lenses are without opacity EARS: Pinnae are normal. Patient hears normal voice tunes of the examiner MOUTH and THROAT: Lips are without lesions. Oral mucosa is moist and without lesions. Tongue is normal in shape, size, and color and without lesions NECK: supple, trachea midline, no neck masses, no thyroid tenderness, no thyromegaly LYMPHATICS: no LAN in the neck, no supraclavicular LAN RESPIRATORY: breathing is even & unlabored, BS CTAB CARDIAC: RRR, no murmur,no extra heart sounds, no  edema GI: abdomen soft, normal BS, no masses, no tenderness, no hepatomegaly, no splenomegaly EXTREMITIES:  Able to move X 4 extremities PSYCHIATRIC: Alert and oriented X 3. Affect and behavior are appropriate   LABS/RADIOLOGY: Labs reviewed: Basic Metabolic Panel:  Recent Labs  08/02/16 11/24/16 12/31/16 0910  NA 135* 142 138  K 4.9 5.0 5.0  CL  --   --  97  CO2  --   --  22  GLUCOSE  --   --  190*  BUN 26* 23* 22  CREATININE 1.5* 1.3 1.54*  CALCIUM  --   --  9.6   Liver  Function Tests:  Recent Labs  11/24/16 12/31/16 0910  AST 8* 12  ALT 7* 11  ALKPHOS 65 65  BILITOT  --  0.2  PROT  --  7.2  ALBUMIN  --  3.9    Recent Labs  12/31/16 0910  AMMONIA 51   CBC:  Recent Labs  11/24/16 12/31/16 0910 02/17/17  WBC 5.9 6.5 5.5  NEUTROABS  --  3.9  --   HGB 13.9 12.4* 13.4*  HCT 44 39.1 41  MCV  --  93  --   PLT 185 187 168   Lipid Panel:  Recent Labs  05/09/16  HDL 33*   ASSESSMENT/PLAN:  1. Essential hypertension - well-controlled; continue amlodipine 5 mg 1 tab by mouth daily and lisinopril 5 mg 1 tab by mouth daily   2. Iron deficiency anemia, unspecified iron deficiency anemia type -  discontinue ferrous sulfate  Lab Results  Component Value Date   HGB 13.4 (A) 02/17/2017    3. Depression, unspecified depression type - followed-up by  [facility] NP, continue sertraline 50 mg 1 tab by mouth daily   4. Hyperlipidemia, unspecified hyperlipidemia type - Pravastatin 10 mg 1 tab by mouth daily at bedtime Lab Results  Component Value Date   CHOL 159 05/09/2016   HDL 33 (A) 05/09/2016   LDLCALC 72 05/09/2016   TRIG 269 (A) 05/09/2016   CHOLHDL 2.2 05/23/2013     5. Diabetes mellitus due to underlying condition, uncontrolled, with stage 2 chronic kidney disease, with long-term current use of insulin (HCC) - Continue Lantus 100 units/mL 45 units subcutaneous daily at bedtime, glipizide 5 mg 1 tab by mouth twice a day, NovoLog 100 units/mL  flexpen 10 units subcutaneous before meals, CBG ACHS   6. 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 Dysuria - check UA CS      Goals of care:  Long-term care    Monina C. [place] - NP    [employer] [phone]

## 2017-03-14 DIAGNOSIS — Z9181 History of falling: Secondary | ICD-10-CM | POA: Diagnosis not present

## 2017-03-14 DIAGNOSIS — N390 Urinary tract infection, site not specified: Secondary | ICD-10-CM | POA: Diagnosis not present

## 2017-03-14 DIAGNOSIS — R319 Hematuria, unspecified: Secondary | ICD-10-CM | POA: Diagnosis not present

## 2017-03-14 DIAGNOSIS — R1312 Dysphagia, oropharyngeal phase: Secondary | ICD-10-CM | POA: Diagnosis not present

## 2017-04-07 ENCOUNTER — Non-Acute Institutional Stay (SKILLED_NURSING_FACILITY): Payer: Medicare Other | Admitting: Adult Health

## 2017-04-07 ENCOUNTER — Encounter: Payer: Self-pay | Admitting: Adult Health

## 2017-04-07 DIAGNOSIS — E785 Hyperlipidemia, unspecified: Secondary | ICD-10-CM

## 2017-04-07 DIAGNOSIS — I10 Essential (primary) hypertension: Secondary | ICD-10-CM

## 2017-04-07 DIAGNOSIS — N183 Chronic kidney disease, stage 3 (moderate): Secondary | ICD-10-CM | POA: Diagnosis not present

## 2017-04-07 DIAGNOSIS — F329 Major depressive disorder, single episode, unspecified: Secondary | ICD-10-CM

## 2017-04-07 DIAGNOSIS — F39 Unspecified mood [affective] disorder: Secondary | ICD-10-CM | POA: Diagnosis not present

## 2017-04-07 DIAGNOSIS — F32A Depression, unspecified: Secondary | ICD-10-CM

## 2017-04-07 DIAGNOSIS — E1122 Type 2 diabetes mellitus with diabetic chronic kidney disease: Secondary | ICD-10-CM

## 2017-04-07 NOTE — Progress Notes (Signed)
DATE:  04/07/2017   MRN:  [id number]  BIRTHDAY: [birth year]  Facility:  Nursing Home Location:  [facility] Room Number: 110-B  LEVEL OF CARE:  SNF (31)  Contact Information    Name Relation Home Work Mobile   [place] Friend [phone]     Hicks,Nancy Significant other [phone]     Chaya Jan    [phone]       Code Status History    Date Active Date Inactive Code Status Order ID Comments User Context   12/30/2015  4:39 PM 01/02/2016  6:36 PM Full Code [id number]  Collier Salina, MD Inpatient   11/20/2015  5:29 AM 11/21/2015 10:28 PM Full Code [id number]  Bethena Roys, MD ED   10/10/2015  7:59 PM 10/11/2015  3:25 PM Full Code [id number]  Harvel Quale, MD ED   05/04/2015 11:34 AM 05/07/2015  8:52 PM Full Code [id number]  Bethena Roys, MD Inpatient   02/26/2014 12:36 AM 03/01/2014  8:13 AM Full Code [id number]  Olga Millers, MD Inpatient   12/15/2013  4:39 AM 12/17/2013  9:12 PM Full Code [id number]  Charlann Lange, MD Inpatient   09/03/2012 12:44 AM 09/04/2012  6:39 PM Full Code [id number]  Mackie Pai., MD ED   08/21/2012  4:25 AM 08/22/2012  7:10 PM Full Code [id number]  Hester Mates, MD Inpatient       Chief Complaint  Patient presents with  . Medical Management of Chronic Issues    Routine visit    HISTORY OF PRESENT ILLNESS:  This is a 83-YO male seen for a routine visit.  He is a long-term care resident of [facility]. He was seen in the room today and denies any concerns. He was seen conversing with his roommate.  He has PMH of alcohol abuse, alcohol withdrawal seizure, anemia, cataract, chronic renal insufficiency stage III, depression, hearing impairment, hypertension, hyperlipidemia, anemia, hypertension and diabetes mellitus type 2    PAST MEDICAL HISTORY:  Past Medical History:  Diagnosis Date  . Abnormality of gait 01/14/2016  . Alcohol abuse 2009   dependency as exhibited  by ETOH withdrawal seizure.   . Alcohol withdrawal seizure ([place]) 2013  . Anemia 2011   Normocytic.   . Cataract   . Chronic renal insufficiency, stage III (moderate) 02/26/2014  . Depression with anxiety   . Hearing impairment   . HTN (hypertension), benign 02/26/2014  . Hyperlipemia   . Hypertension   . Seizures ([place])   . Type II or unspecified type diabetes mellitus with neurological manifestations, not stated as uncontrolled(250.60) 02/26/2014     CURRENT MEDICATIONS: Reviewed  Patient's Medications  New Prescriptions   No medications on file  Previous Medications   ACETAMINOPHEN (TYLENOL) 500 MG TABLET    1,000 mg every 12 (twelve) hours. Take 2 tablets by mouth every morning and every evening for pain.    AMLODIPINE (NORVASC) 5 MG TABLET    Take 5 mg by mouth daily.    ASPIRIN (ASPIRIN EC) 81 MG EC TABLET    Take 81 mg by mouth daily. Swallow whole.   FOLIC ACID (FOLVITE) 1 MG TABLET    Take 1 tablet (1 mg total) by mouth daily.   GLIPIZIDE (GLUCOTROL) 5 MG TABLET    Take 5 mg by mouth 2 (two) times daily with a meal.    HYDROCORTISONE CREAM 1 %    Apply 1 application topically 2 (two) times daily.  INSULIN ASPART (NOVOLOG) 100 UNIT/ML INJECTION    Give 10 units SQ with meals. Call MD if CBG >400.   INSULIN GLARGINE (LANTUS) 100 UNIT/ML INJECTION    Inject 45 Units into the skin at bedtime.    IPRATROPIUM-ALBUTEROL (DUONEB) 0.5-2.5 (3) MG/3ML SOLN    Take 3 mLs by nebulization every 6 (six) hours as needed.   LISINOPRIL (PRINIVIL,ZESTRIL) 5 MG TABLET    Take 5 mg by mouth daily.   PRAVASTATIN (PRAVACHOL) 10 MG TABLET    Take 10 mg by mouth at bedtime.    PROPYLENE GLYCOL (SYSTANE BALANCE OP)    One drop in each eye daily for dry eye   QUETIAPINE (SEROQUEL) 100 MG TABLET    Take 100 mg by mouth 2 (two) times daily.    SERTRALINE (ZOLOFT) 50 MG TABLET    Take 50 mg by mouth daily.   THIAMINE (VITAMIN B-1) 100 MG TABLET    Take 100 mg by mouth daily.   VALPROIC ACID (DEPAKENE) 250  MG/5ML SYRUP    Take 10 mLs (500 mg total) by mouth 3 (three) times daily.  Modified Medications   No medications on file  Discontinued Medications   FERROUS SULFATE 325 (65 FE) MG TABLET    Take 1 tablet (325 mg total) by mouth daily with breakfast.     No Known Allergies   REVIEW OF SYSTEMS:  GENERAL: no change in appetite, no fatigue, no weight changes, no fever, chills or weakness EYES: Denies change in vision, dry eyes, eye pain, itching or discharge EARS: Denies change in hearing, ringing in ears, or earache NOSE: Denies nasal congestion or epistaxis MOUTH and THROAT: Denies oral discomfort, gingival pain or bleeding, pain from teeth or hoarseness   RESPIRATORY: no cough, SOB, DOE, wheezing, hemoptysis CARDIAC: no chest pain, edema or palpitations GI: no abdominal pain, diarrhea, constipation, heart burn, nausea or vomiting PSYCHIATRIC: Denies feeling of depression or anxiety. No report of hallucinations, insomnia, paranoia, or agitation   PHYSICAL EXAMINATION  GENERAL APPEARANCE: Well nourished. In no acute distress. Obese SKIN:  Skin is warm and dry.  HEAD: Normal in size and contour. No evidence of trauma EYES: Lids open and close normally. No blepharitis, entropion or ectropion. PERRL. Conjunctivae are clear and sclerae are white. Lenses are without opacity EARS: Pinnae are normal. Patient hears normal voice tunes of the examiner MOUTH and THROAT: Lips are without lesions. Oral mucosa is moist and without lesions. Tongue is normal in shape, size, and color and without lesions NECK: supple, trachea midline, no neck masses, no thyroid tenderness, no thyromegaly LYMPHATICS: no LAN in the neck, no supraclavicular LAN RESPIRATORY: breathing is even & unlabored, BS CTAB CARDIAC: RRR, no murmur,no extra heart sounds, no edema GI: abdomen soft, normal BS, no masses, no tenderness, no hepatomegaly, no splenomegaly EXTREMITIES:  Able to move X 4 extremities PSYCHIATRIC: Alert to  self and place, disoriented to time.  Affect and behavior are appropriate   LABS/RADIOLOGY: Labs reviewed: Basic Metabolic Panel:  Recent Labs  08/02/16 11/24/16 12/31/16 0910  NA 135* 142 138  K 4.9 5.0 5.0  CL  --   --  97  CO2  --   --  22  GLUCOSE  --   --  190*  BUN 26* 23* 22  CREATININE 1.5* 1.3 1.54*  CALCIUM  --   --  9.6   Liver Function Tests:  Recent Labs  11/24/16 12/31/16 0910  AST 8* 12  ALT 7* 11  ALKPHOS 65 65  BILITOT  --  0.2  PROT  --  7.2  ALBUMIN  --  3.9    Recent Labs  12/31/16 0910  AMMONIA 51   CBC:  Recent Labs  11/24/16 12/31/16 0910 02/17/17  WBC 5.9 6.5 5.5  NEUTROABS  --  3.9  --   HGB 13.9 12.4* 13.4*  HCT 44 39.1 41  MCV  --  93  --   PLT 185 187 168   Lipid Panel:  Recent Labs  05/09/16  HDL 33*    ASSESSMENT/PLAN:  1. Type 2 diabetes mellitus with stage 3 chronic kidney disease, without long-term current use of insulin (HCC) - Well-controlled; continue Lantus 100 units/mL injected 45 units subcutaneous daily at bedtime, NovoLog 100 units/mL injected 10 units subcutaneous before meals and glipizide 5 mg 1 tab by mouth twice a day Lab Results  Component Value Date   HGBA1C 7.9 02/19/2017     2. Depression, unspecified depression type - continue sertraline 50 mg 1 tab by mouth daily, followed up by [facility]   3. Essential hypertension - well controlled; continue amlodipine 5 mg 1 tab by mouth daily and lisinopril 5 mg 1 tab by mouth daily   4. 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 Hyperlipidemia, unspecified hyperlipidemia type - continue pravastatin 10 mg 1 tab by mouth daily; check lipid panel     Goals of care:  Long-term care    Layali Freund C. [place]aux - NP    [employer] [phone]

## 2017-04-08 DIAGNOSIS — E785 Hyperlipidemia, unspecified: Secondary | ICD-10-CM | POA: Diagnosis not present

## 2017-04-08 LAB — LIPID PANEL
CHOLESTEROL: 212 — AB (ref 0–200)
HDL: 38 (ref 35–70)
LDL Cholesterol: 96
LDl/HDL Ratio: 5.6
Triglycerides: 392 — AB (ref 40–160)

## 2017-04-09 ENCOUNTER — Encounter: Payer: Self-pay | Admitting: Adult Health

## 2017-04-09 ENCOUNTER — Non-Acute Institutional Stay (SKILLED_NURSING_FACILITY): Payer: Medicare Other | Admitting: Adult Health

## 2017-04-09 DIAGNOSIS — E782 Mixed hyperlipidemia: Secondary | ICD-10-CM | POA: Diagnosis not present

## 2017-04-09 NOTE — Progress Notes (Signed)
DATE:  04/09/2017   MRN:  [id number]  BIRTHDAY: [birth year]  Facility:  Nursing Home Location:  [facility] Room Number: 110-B  LEVEL OF CARE:  SNF (31)  Contact Information    Name Relation Home Work Mobile   [place] Friend [phone]     Hicks,Nancy Significant other [phone]     Chaya Jan    [phone]       Code Status History    Date Active Date Inactive Code Status Order ID Comments User Context   12/30/2015  4:39 PM 01/02/2016  6:36 PM Full Code [id number]  Collier Salina, MD Inpatient   11/20/2015  5:29 AM 11/21/2015 10:28 PM Full Code [id number]  Bethena Roys, MD ED   10/10/2015  7:59 PM 10/11/2015  3:25 PM Full Code [id number]  Harvel Quale, MD ED   05/04/2015 11:34 AM 05/07/2015  8:52 PM Full Code [id number]  Bethena Roys, MD Inpatient   02/26/2014 12:36 AM 03/01/2014  8:13 AM Full Code [id number]  Olga Millers, MD Inpatient   12/15/2013  4:39 AM 12/17/2013  9:12 PM Full Code [id number]  Charlann Lange, MD Inpatient   09/03/2012 12:44 AM 09/04/2012  6:39 PM Full Code [id number]  Mackie Pai., MD ED   08/21/2012  4:25 AM 08/22/2012  7:10 PM Full Code [id number]  Hester Mates, MD Inpatient       Chief Complaint  Patient presents with  . Acute Visit    Hypercholesterolemia, elevated triglycerides    HISTORY OF PRESENT ILLNESS:  This is a 71-YO male seen for an acute visit secondary to hypercholesterolemia 212 and triglycerides  392.  He is currently taking Pravastatin 10 mg Q HS. No complaints of muscle aches         PAST MEDICAL HISTORY:  Past Medical History:  Diagnosis Date  . Abnormality of gait 01/14/2016  . Alcohol abuse 2009   dependency as exhibited by ETOH withdrawal seizure.   . Alcohol withdrawal seizure ([place]) 2013  . Anemia 2011   Normocytic.   . Cataract   . Chronic renal insufficiency, stage III (moderate) 02/26/2014  . Depression with anxiety   . Hearing impairment   . HTN  (hypertension), benign 02/26/2014  . Hyperlipemia   . Hypertension   . Seizures ([place])   . Type II or unspecified type diabetes mellitus with neurological manifestations, not stated as uncontrolled(250.60) 02/26/2014     CURRENT MEDICATIONS: Reviewed  Patient's Medications  New Prescriptions   No medications on file  Previous Medications   ACETAMINOPHEN (TYLENOL) 500 MG TABLET    1,000 mg every 12 (twelve) hours. Take 2 tablets by mouth every morning and every evening for pain.    AMLODIPINE (NORVASC) 5 MG TABLET    Take 5 mg by mouth daily.    ASPIRIN (ASPIRIN EC) 81 MG EC TABLET    Take 81 mg by mouth daily. Swallow whole.   FOLIC ACID (FOLVITE) 1 MG TABLET    Take 1 tablet (1 mg total) by mouth daily.   GLIPIZIDE (GLUCOTROL) 5 MG TABLET    Take 5 mg by mouth 2 (two) times daily with a meal.    HYDROCORTISONE CREAM 1 %    Apply 1 application topically 2 (two) times daily as needed.    INSULIN ASPART (NOVOLOG) 100 UNIT/ML INJECTION    Give 10 units SQ with meals. Call MD if CBG >400.   INSULIN GLARGINE (LANTUS) 100 UNIT/ML INJECTION  Inject 45 Units into the skin at bedtime.    IPRATROPIUM-ALBUTEROL (DUONEB) 0.5-2.5 (3) MG/3ML SOLN    Take 3 mLs by nebulization every 6 (six) hours as needed.   LISINOPRIL (PRINIVIL,ZESTRIL) 5 MG TABLET    Take 5 mg by mouth daily.   PRAVASTATIN (PRAVACHOL) 10 MG TABLET    Take 10 mg by mouth at bedtime.    PROPYLENE GLYCOL (SYSTANE BALANCE OP)    One drop in each eye daily for dry eye   QUETIAPINE (SEROQUEL) 100 MG TABLET    Take 100 mg by mouth 2 (two) times daily.    SERTRALINE (ZOLOFT) 50 MG TABLET    Take 50 mg by mouth daily.   THIAMINE (VITAMIN B-1) 100 MG TABLET    Take 100 mg by mouth daily.   VALPROIC ACID (DEPAKENE) 250 MG/5ML SYRUP    Take 10 mLs (500 mg total) by mouth 3 (three) times daily.  Modified Medications   No medications on file  Discontinued Medications   No medications on file     No Known Allergies   REVIEW OF  SYSTEMS:  GENERAL: no change in appetite, no fatigue, no weight changes, no fever, chills or weakness EYES: Denies change in vision, dry eyes, eye pain, itching or discharge EARS: Denies change in hearing, ringing in ears, or earache NOSE: Denies nasal congestion or epistaxis MOUTH and THROAT: Denies oral discomfort, gingival pain or bleeding, pain from teeth or hoarseness   RESPIRATORY: no cough, SOB, DOE, wheezing, hemoptysis CARDIAC: no chest pain, edema or palpitations GI: no abdominal pain, diarrhea, constipation, heart burn, nausea or vomiting GU: Denies dysuria, frequency, hematuria or discharge PSYCHIATRIC: Denies feeling of depression or anxiety. No report of hallucinations, insomnia, paranoia, or agitation     PHYSICAL EXAMINATION  GENERAL APPEARANCE: Well nourished. In no acute distress. Obese SKIN:  Skin is warm and dry. HEAD: Normal in size and contour. No evidence of trauma EYES: Lids open and close normally. No blepharitis, entropion or ectropion. PERRL. Conjunctivae are clear and sclerae are white. Lenses are without opacity EARS: Pinnae are normal. Patient hears normal voice tunes of the examiner MOUTH and THROAT: Lips are without lesions. Oral mucosa is moist and without lesions. Tongue is normal in shape, size, and color and without lesions NECK: supple, trachea midline, no neck masses, no thyroid tenderness, no thyromegaly LYMPHATICS: no LAN in the neck, no supraclavicular LAN RESPIRATORY: breathing is even & unlabored, BS CTAB CARDIAC: RRR, no murmur,no extra heart sounds, no edema GI: abdomen soft, normal BS, no masses, no tenderness, no hepatomegaly, no splenomegaly GU:  Incontinent of urine EXTREMITIES:  Able to move X 4 extremities PSYCHIATRIC: Alert to self and place, disoriented to time. Affect and behavior are appropriate   LABS/RADIOLOGY: Labs reviewed: Basic Metabolic Panel:  Recent Labs  08/02/16 11/24/16 12/31/16 0910  NA 135* 142 138  K 4.9  5.0 5.0  CL  --   --  97  CO2  --   --  22  GLUCOSE  --   --  190*  BUN 26* 23* 22  CREATININE 1.5* 1.3 1.54*  CALCIUM  --   --  9.6   Liver Function Tests:  Recent Labs  11/24/16 12/31/16 0910  AST 8* 12  ALT 7* 11  ALKPHOS 65 65  BILITOT  --  0.2  PROT  --  7.2  ALBUMIN  --  3.9    Recent Labs  12/31/16 0910  AMMONIA 51   CBC:  Recent Labs  11/24/16 12/31/16 0910 02/17/17  WBC 5.9 6.5 5.5  NEUTROABS  --  3.9  --   HGB 13.9 12.4* 13.4*  HCT 44 39.1 41  MCV  --  93  --   PLT 185 187 168   Lipid Panel:  Recent Labs  05/09/16 04/08/17  HDL 33* 38    ASSESSMENT/PLAN:  1. Mixed hyperlipidemia -  Increase Pravastatin from 10 mg to 20 mg 1 tab PO Q HS     Kalani Baray C. [place] - NP    [employer] [phone]

## 2017-04-17 DIAGNOSIS — E1151 Type 2 diabetes mellitus with diabetic peripheral angiopathy without gangrene: Secondary | ICD-10-CM | POA: Diagnosis not present

## 2017-04-17 DIAGNOSIS — L603 Nail dystrophy: Secondary | ICD-10-CM | POA: Diagnosis not present

## 2017-04-17 DIAGNOSIS — Z794 Long term (current) use of insulin: Secondary | ICD-10-CM | POA: Diagnosis not present

## 2017-04-17 DIAGNOSIS — B351 Tinea unguium: Secondary | ICD-10-CM | POA: Diagnosis not present

## 2017-04-21 ENCOUNTER — Non-Acute Institutional Stay (SKILLED_NURSING_FACILITY): Payer: Medicare Other

## 2017-04-21 DIAGNOSIS — Z0000 Encounter for general adult medical examination without abnormal findings: Secondary | ICD-10-CM | POA: Diagnosis not present

## 2017-04-21 NOTE — Patient Instructions (Signed)
[name] , Thank you for taking time to come for your Medicare Wellness Visit. I appreciate your ongoing commitment to your health goals. Please review the following plan we discussed and let me know if I can assist you in the future.   Screening recommendations/referrals: Colonoscopy excluded, long term pt  Recommended yearly ophthalmology/optometry visit for glaucoma screening and checkup Recommended yearly dental visit for hygiene and checkup  Vaccinations: Influenza vaccine due when available Pneumococcal vaccine up to date Tdap vaccine up to date. Due 11/28/21 Shingles vaccine not in records  Advanced directives: Full code, copies of health care power of attorney and living will are needed    Conditions/risks identified: None  Next appointment: Dr. Linna Darner makes rounds  Preventive Care 38 Years and Older, Male Preventive care refers to lifestyle choices and visits with your health care provider that can promote health and wellness. What does preventive care include?  A yearly physical exam. This is also called an annual well check.  Dental exams once or twice a year.  Routine eye exams. Ask your health care provider how often you should have your eyes checked.  Personal lifestyle choices, including:  Daily care of your teeth and gums.  Regular physical activity.  Eating a healthy diet.  Avoiding tobacco and drug use.  Limiting alcohol use.  Practicing safe sex.  Taking low doses of aspirin every day.  Taking vitamin and mineral supplements as recommended by your health care provider. What happens during an annual well check? The services and screenings done by your health care provider during your annual well check will depend on your age, overall health, lifestyle risk factors, and family history of disease. Counseling  Your health care provider may ask you questions about your:  Alcohol use.  Tobacco use.  Drug use.  Emotional well-being.  Home and  relationship well-being.  Sexual activity.  Eating habits.  History of falls.  Memory and ability to understand (cognition).  Work and work Statistician. Screening  You may have the following tests or measurements:  Height, weight, and BMI.  Blood pressure.  Lipid and cholesterol levels. These may be checked every 5 years, or more frequently if you are over 41 years old.  Skin check.  Lung cancer screening. You may have this screening every year starting at age 60 if you have a 30-pack-year history of smoking and currently smoke or have quit within the past 15 years.  Fecal occult blood test (FOBT) of the stool. You may have this test every year starting at age 43.  Flexible sigmoidoscopy or colonoscopy. You may have a sigmoidoscopy every 5 years or a colonoscopy every 10 years starting at age 88.  Prostate cancer screening. Recommendations will vary depending on your family history and other risks.  Hepatitis C blood test.  Hepatitis B blood test.  Sexually transmitted disease (STD) testing.  Diabetes screening. This is done by checking your blood sugar (glucose) after you have not eaten for a while (fasting). You may have this done every 1-3 years.  Abdominal aortic aneurysm (AAA) screening. You may need this if you are a current or former smoker.  Osteoporosis. You may be screened starting at age 39 if you are at high risk. Talk with your health care provider about your test results, treatment options, and if necessary, the need for more tests. Vaccines  Your health care provider may recommend certain vaccines, such as:  Influenza vaccine. This is recommended every year.  Tetanus, diphtheria, and acellular pertussis (  Tdap, Td) vaccine. You may need a Td booster every 10 years.  Zoster vaccine. You may need this after age 13.  Pneumococcal 13-valent conjugate (PCV13) vaccine. One dose is recommended after age 62.  Pneumococcal polysaccharide (PPSV23) vaccine. One  dose is recommended after age 76. Talk to your health care provider about which screenings and vaccines you need and how often you need them. This information is not intended to replace advice given to you by your health care provider. Make sure you discuss any questions you have with your health care provider. Document Released: 10/12/2015 Document Revised: 06/04/2016 Document Reviewed: 07/17/2015 Elsevier Interactive Patient Education  2017 [place] Prevention in the Home Falls can cause injuries. They can happen to people of all ages. There are many things you can do to make your home safe and to help prevent falls. What can I do on the outside of my home?  Regularly fix the edges of walkways and driveways and fix any cracks.  Remove anything that might make you trip as you walk through a door, such as a raised step or threshold.  Trim any bushes or trees on the path to your home.  Use bright outdoor lighting.  Clear any walking paths of anything that might make someone trip, such as rocks or tools.  Regularly check to see if handrails are loose or broken. Make sure that both sides of any steps have handrails.  Any raised decks and porches should have guardrails on the edges.  Have any leaves, snow, or ice cleared regularly.  Use sand or salt on walking paths during winter.  Clean up any spills in your garage right away. This includes oil or grease spills. What can I do in the bathroom?  Use night lights.  Install grab bars by the toilet and in the tub and shower. Do not use towel bars as grab bars.  Use non-skid mats or decals in the tub or shower.  If you need to sit down in the shower, use a plastic, non-slip stool.  Keep the floor dry. Clean up any water that spills on the floor as soon as it happens.  Remove soap buildup in the tub or shower regularly.  Attach bath mats securely with double-sided non-slip rug tape.  Do not have throw rugs and other  things on the floor that can make you trip. What can I do in the bedroom?  Use night lights.  Make sure that you have a light by your bed that is easy to reach.  Do not use any sheets or blankets that are too big for your bed. They should not hang down onto the floor.  Have a firm chair that has side arms. You can use this for support while you get dressed.  Do not have throw rugs and other things on the floor that can make you trip. What can I do in the kitchen?  Clean up any spills right away.  Avoid walking on wet floors.  Keep items that you use a lot in easy-to-reach places.  If you need to reach something above you, use a strong step stool that has a grab bar.  Keep electrical cords out of the way.  Do not use floor polish or wax that makes floors slippery. If you must use wax, use non-skid floor wax.  Do not have throw rugs and other things on the floor that can make you trip. What can I do with my stairs?  Do  not leave any items on the stairs.  Make sure that there are handrails on both sides of the stairs and use them. Fix handrails that are broken or loose. Make sure that handrails are as long as the stairways.  Check any carpeting to make sure that it is firmly attached to the stairs. Fix any carpet that is loose or worn.  Avoid having throw rugs at the top or bottom of the stairs. If you do have throw rugs, attach them to the floor with carpet tape.  Make sure that you have a light switch at the top of the stairs and the bottom of the stairs. If you do not have them, ask someone to add them for you. What else can I do to help prevent falls?  Wear shoes that:  Do not have high heels.  Have rubber bottoms.  Are comfortable and fit you well.  Are closed at the toe. Do not wear sandals.  If you use a stepladder:  Make sure that it is fully opened. Do not climb a closed stepladder.  Make sure that both sides of the stepladder are locked into place.  Ask  someone to hold it for you, if possible.  Clearly mark and make sure that you can see:  Any grab bars or handrails.  First and last steps.  Where the edge of each step is.  Use tools that help you move around (mobility aids) if they are needed. These include:  Canes.  Walkers.  Scooters.  Crutches.  Turn on the lights when you go into a dark area. Replace any light bulbs as soon as they burn out.  Set up your furniture so you have a clear path. Avoid moving your furniture around.  If any of your floors are uneven, fix them.  If there are any pets around you, be aware of where they are.  Review your medicines with your doctor. Some medicines can make you feel dizzy. This can increase your chance of falling. Ask your doctor what other things that you can do to help prevent falls. This information is not intended to replace advice given to you by your health care provider. Make sure you discuss any questions you have with your health care provider. Document Released: 07/12/2009 Document Revised: 02/21/2016 Document Reviewed: 10/20/2014 Elsevier Interactive Patient Education  2017 [employer].

## 2017-04-21 NOTE — Progress Notes (Signed)
Subjective:   William [name] is a 71 y.o. male who presents for an Initial Medicare Annual Wellness Visit at [facility]   Objective:    Today's Vitals   04/21/17 1325  BP: 130/76  Pulse: 84  Temp: 98 F (36.7 C)  TempSrc: Oral  SpO2: 98%  Weight: 240 lb (108.9 kg)  Height: 5\' 8"  (1.727 m)   Body mass index is 36.49 kg/m.  Current Medications (verified) Outpatient Encounter Prescriptions as of 04/21/2017  Medication Sig  . acetaminophen (TYLENOL) 500 MG tablet 1,000 mg every 12 (twelve) hours. Take 2 tablets by mouth every morning and every evening for pain.   Marland Kitchen amLODipine (NORVASC) 5 MG tablet Take 5 mg by mouth daily.   Marland Kitchen aspirin (ASPIRIN EC) 81 MG EC tablet Take 81 mg by mouth daily. Swallow whole.  . folic acid (FOLVITE) 1 MG tablet Take 1 tablet (1 mg total) by mouth daily.  Marland Kitchen glipiZIDE (GLUCOTROL) 5 MG tablet Take 5 mg by mouth 2 (two) times daily with a meal.   . hydrocortisone cream 1 % Apply 1 application topically 2 (two) times daily as needed.   . insulin aspart (NOVOLOG) 100 UNIT/ML injection Give 10 units SQ with meals. Call MD if CBG >400.  Marland Kitchen insulin glargine (LANTUS) 100 UNIT/ML injection Inject 45 Units into the skin at bedtime.   Marland Kitchen ipratropium-albuterol (DUONEB) 0.5-2.5 (3) MG/3ML SOLN Take 3 mLs by nebulization every 6 (six) hours as needed.  Marland Kitchen lisinopril (PRINIVIL,ZESTRIL) 5 MG tablet Take 5 mg by mouth daily.  . pravastatin (PRAVACHOL) 10 MG tablet Take 20 mg by mouth at bedtime.  Marland Kitchen Propylene Glycol (SYSTANE BALANCE OP) One drop in each eye daily for dry eye  . QUEtiapine (SEROQUEL) 100 MG tablet Take 100 mg by mouth 2 (two) times daily.   . sertraline (ZOLOFT) 50 MG tablet Take 50 mg by mouth daily.  Marland Kitchen thiamine (VITAMIN B-1) 100 MG tablet Take 100 mg by mouth daily.  Marland Kitchen valproic acid (DEPAKENE) 250 MG/5ML syrup Take 10 mLs (500 mg total) by mouth 3 (three) times daily.  . [DISCONTINUED] pravastatin (PRAVACHOL) 10 MG tablet Take 10 mg by  mouth at bedtime.    No facility-administered encounter medications on file as of 04/21/2017.     Allergies (verified) Patient has no known allergies.   History: Past Medical History:  Diagnosis Date  . Abnormality of gait 01/14/2016  . Alcohol abuse 2009   dependency as exhibited by ETOH withdrawal seizure.   . Alcohol withdrawal seizure ([place]) 2013  . Anemia 2011   Normocytic.   . Cataract   . Chronic renal insufficiency, stage III (moderate) 02/26/2014  . Depression with anxiety   . Hearing impairment   . HTN (hypertension), benign 02/26/2014  . Hyperlipemia   . Hypertension   . Seizures ([place])   . Type II or unspecified type diabetes mellitus with neurological manifestations, not stated as uncontrolled(250.60) 02/26/2014   Past Surgical History:  Procedure Laterality Date  . NO PAST SURGERIES     Family History  Problem Relation Age of Onset  . Family history unknown: Yes   Social History   Occupational History  . Not on file.   Social History Main Topics  . Smoking status: Former Smoker    Types: Cigars  . Smokeless tobacco: Current User    Types: Chew  . Alcohol use No     Comment: 04/2015 I have not drank in over a month "  .  Drug use: No  . Sexual activity: Not Currently   Tobacco Counseling Ready to quit: Not Answered Counseling given: Not Answered   Activities of Daily Living In your present state of health, do you have any difficulty performing the following activities: 04/21/2017  Hearing? Y  Vision? Y  Difficulty concentrating or making decisions? Y  Walking or climbing stairs? Y  Dressing or bathing? Y  Doing errands, shopping? Y  Preparing Food and eating ? Y  Using the Toilet? Y  In the past six months, have you accidently leaked urine? Y  Do you have problems with loss of bowel control? Y  Managing your Medications? Y  Managing your Finances? Y  Housekeeping or managing your Housekeeping? Y  Some recent data might be hidden     Immunizations and Health Maintenance Immunization History  Administered Date(s) Administered  . Influenza Split 06/07/2012  . Influenza,inj,Quad PF,36+ Mos 12/16/2013  . Influenza-Unspecified 06/27/2016  . PPD Test 01/02/2016  . Pneumococcal Polysaccharide-23 05/17/2009, 05/30/2012, 12/16/2013  . Td 05/17/2009  . Tdap 11/29/2011   There are no preventive care reminders to display for this patient.  Patient Care Team: Hendricks Limes, MD as PCP - General (Internal Medicine)  Indicate any recent Medical Services you may have received from other than [facility] providers in the past year (date may be approximate).    Assessment:   This is a routine wellness examination for William [name].   Hearing/Vision screen No exam data present  Dietary issues and exercise activities discussed: Current Exercise Habits: The patient does not participate in regular exercise at present, Exercise limited by: None identified  Goals    . Blood Pressure < 150/90    . HEMOGLOBIN A1C < 7.0    . LDL CALC < 100      Depression Screen PHQ 2/9 Scores 04/21/2017 01/06/2014 05/23/2013 02/10/2013  PHQ - 2 Score 0 0 1 0    Fall Risk Fall Risk  04/21/2017 06/18/2016 06/11/2016 05/28/2016 05/23/2016  Falls in the past year? Yes No No No No  Number falls in past yr: 1 - - - -  Injury with Fall? No - - - -  Risk Factor Category  - - - - -  Risk for fall due to : - - - - -  Risk for fall due to (comments): - - - - -  Follow up - - - - -    Cognitive Function:     6CIT Screen 04/21/2017  What Year? 4 points  What month? 0 points  What time? 0 points  Count back from 20 4 points  Months in reverse 4 points  Repeat phrase 0 points  Total Score 12    Screening Tests Health Maintenance  Topic Date Due  . FOOT EXAM  12/25/2017 (Originally 05/23/2014)  . PNA vac Low Risk Adult (2 of 2 - PCV13) 03/13/2018 (Originally 12/17/2014)  . Hepatitis C Screening  01/27/2019 (Originally [birth year])  . COLONOSCOPY   06/29/2025 (Originally 10/18/1993)  . INFLUENZA VACCINE  04/29/2017  . HEMOGLOBIN A1C  05/22/2017  . OPHTHALMOLOGY EXAM  07/10/2017  . LIPID PANEL  04/08/2018  . TETANUS/TDAP  11/28/2021        Plan:    I have personally reviewed and addressed the Medicare Annual Wellness questionnaire and have noted the following in the patient's chart:  A. Medical and social history B. Use of alcohol, tobacco or illicit drugs  C. Current medications and supplements D. Functional ability and status E.  Nutritional status F.  Physical activity G. Advance directives H. List of other physicians I.  Hospitalizations, surgeries, and ER visits in previous 12 months J.  [place] to include hearing, vision, cognitive, depression L. Referrals and appointments - none  In addition, I have reviewed and discussed with patient certain preventive protocols, quality metrics, and best practice recommendations. A written personalized care plan for preventive services as well as general preventive health recommendations were provided to patient.  See attached scanned questionnaire for additional information.   Signed,   Rich Reining, RN Nurse Health Advisor   Quick Notes   Health Maintenance: Foot exam and Hep c screen due     Abnormal Screen: 6 CIT-12     Patient Concerns: None     Nurse Concerns: None I have personally reviewed the health advisor's clinical note, was available for consultation, and agree with the assessment and plan as written. Hendricks Limes M.D., FACP, [facility]

## 2017-05-12 ENCOUNTER — Non-Acute Institutional Stay (SKILLED_NURSING_FACILITY): Payer: Medicare Other | Admitting: Adult Health

## 2017-05-12 ENCOUNTER — Encounter: Payer: Self-pay | Admitting: Adult Health

## 2017-05-12 DIAGNOSIS — F39 Unspecified mood [affective] disorder: Secondary | ICD-10-CM | POA: Diagnosis not present

## 2017-05-12 DIAGNOSIS — R569 Unspecified convulsions: Secondary | ICD-10-CM

## 2017-05-12 DIAGNOSIS — N183 Chronic kidney disease, stage 3 unspecified: Secondary | ICD-10-CM

## 2017-05-12 DIAGNOSIS — I10 Essential (primary) hypertension: Secondary | ICD-10-CM | POA: Diagnosis not present

## 2017-05-12 DIAGNOSIS — F32A Depression, unspecified: Secondary | ICD-10-CM

## 2017-05-12 DIAGNOSIS — E1122 Type 2 diabetes mellitus with diabetic chronic kidney disease: Secondary | ICD-10-CM

## 2017-05-12 DIAGNOSIS — E782 Mixed hyperlipidemia: Secondary | ICD-10-CM | POA: Diagnosis not present

## 2017-05-12 DIAGNOSIS — F329 Major depressive disorder, single episode, unspecified: Secondary | ICD-10-CM | POA: Diagnosis not present

## 2017-05-12 NOTE — Progress Notes (Signed)
DATE:  05/12/2017   MRN:  [id number]  BIRTHDAY: 1946/09/04  Facility:  Nursing Home Location:  [facility] Room Number: 110-B  LEVEL OF CARE:  SNF (31)  Contact Information    Name Relation Home Work Mobile   Webb Friend [phone]     Hicks,Nancy Significant other [phone]     Chaya Jan    [phone]       Code Status History    Date Active Date Inactive Code Status Order ID Comments User Context   12/30/2015  4:39 PM 01/02/2016  6:36 PM Full Code [id number]  Collier Salina, MD Inpatient   11/20/2015  5:29 AM 11/21/2015 10:28 PM Full Code [id number]  Bethena Roys, MD ED   10/10/2015  7:59 PM 10/11/2015  3:25 PM Full Code [id number]  Harvel Quale, MD ED   05/04/2015 11:34 AM 05/07/2015  8:52 PM Full Code [id number]  Bethena Roys, MD Inpatient   02/26/2014 12:36 AM 03/01/2014  8:13 AM Full Code [id number]  Olga Millers, MD Inpatient   12/15/2013  4:39 AM 12/17/2013  9:12 PM Full Code [id number]  Charlann Lange, MD Inpatient   09/03/2012 12:44 AM 09/04/2012  6:39 PM Full Code [id number]  Mackie Pai., MD ED   08/21/2012  4:25 AM 08/22/2012  7:10 PM Full Code [id number]  Hester Mates, MD Inpatient       Chief Complaint  Patient presents with  . Medical Management of Chronic Issues    Routine visit    HISTORY OF PRESENT ILLNESS:  This is a 56-YO male seen for a routine visit.  He is a long-term care resident of [facility].  His Pravastatin dosage was recently increased from 10 mg to 20 mg daily due to cholesterol  212 and triglycerides 392. No complaints of muscle aches. He was seen in his room today. He was noted to be able to get up from lying position to sitting up on the edge of the bed and getting ready to eat breakfast. He was noted to be chewing snuff. He has a PMH of alcohol abuse, alcohol withdrawal seizure, anemia, cataract, chronic renal insufficiency stage III, depression,  hearing impairment, hypertension, hyperlipidemia, hypertension, and DM type 2.     PAST MEDICAL HISTORY:  Past Medical History:  Diagnosis Date  . Abnormality of gait 01/14/2016  . Alcohol abuse 2009   dependency as exhibited by ETOH withdrawal seizure.   . Alcohol withdrawal seizure ([place]) 2013  . Anemia 2011   Normocytic.   . Cataract   . Chronic renal insufficiency, stage III (moderate) 02/26/2014  . Depression with anxiety   . Hearing impairment   . HTN (hypertension), benign 02/26/2014  . Hyperlipemia   . Hypertension   . Seizures ([place])   . Type II or unspecified type diabetes mellitus with neurological manifestations, not stated as uncontrolled(250.60) 02/26/2014     CURRENT MEDICATIONS: Reviewed  Patient's Medications  New Prescriptions   No medications on file  Previous Medications   ACETAMINOPHEN (TYLENOL) 500 MG TABLET    1,000 mg every 12 (twelve) hours. Take 2 tablets by mouth every morning and every evening for pain.    AMLODIPINE (NORVASC) 5 MG TABLET    Take 5 mg by mouth daily.    ASPIRIN (ASPIRIN EC) 81 MG EC TABLET    Take 81 mg by mouth daily. Swallow whole.   FOLIC ACID (FOLVITE) 1 MG TABLET    Take  1 tablet (1 mg total) by mouth daily.   GLIPIZIDE (GLUCOTROL) 5 MG TABLET    Take 5 mg by mouth 2 (two) times daily with a meal.    HYDROCORTISONE CREAM 1 %    Apply 1 application topically 2 (two) times daily as needed.    INSULIN ASPART (NOVOLOG) 100 UNIT/ML INJECTION    Give 10 units SQ with meals. Call MD if CBG >400.   INSULIN GLARGINE (LANTUS) 100 UNIT/ML INJECTION    Inject 45 Units into the skin at bedtime.    IPRATROPIUM-ALBUTEROL (DUONEB) 0.5-2.5 (3) MG/3ML SOLN    Take 3 mLs by nebulization every 6 (six) hours as needed.   LISINOPRIL (PRINIVIL,ZESTRIL) 5 MG TABLET    Take 5 mg by mouth daily.   PRAVASTATIN (PRAVACHOL) 10 MG TABLET    Take 20 mg by mouth at bedtime.   PROPYLENE GLYCOL (SYSTANE BALANCE OP)    One drop in each eye daily for dry eye    QUETIAPINE (SEROQUEL) 100 MG TABLET    Take 100 mg by mouth 2 (two) times daily.    SERTRALINE (ZOLOFT) 50 MG TABLET    Take 50 mg by mouth daily.   THIAMINE (VITAMIN B-1) 100 MG TABLET    Take 100 mg by mouth daily.   VALPROIC ACID (DEPAKENE) 250 MG/5ML SYRUP    Take 10 mLs (500 mg total) by mouth 3 (three) times daily.  Modified Medications   No medications on file  Discontinued Medications   No medications on file     No Known Allergies   REVIEW OF SYSTEMS:  GENERAL: no change in appetite, no fatigue, no weight changes, no fever, chills or weakness MOUTH and THROAT: Denies oral discomfort, gingival pain or bleeding, pain from teeth or hoarseness   RESPIRATORY: no cough, SOB, DOE, wheezing, hemoptysis CARDIAC: no chest pain, edema or palpitations GI: no abdominal pain, diarrhea, constipation, heart burn, nausea or vomiting GU: Denies dysuria, frequency, hematuria, incontinence, or discharge PSYCHIATRIC: Denies feeling of depression or anxiety. No report of hallucinations, insomnia, paranoia, or agitation    PHYSICAL EXAMINATION  GENERAL APPEARANCE: Well nourished. In no acute distress. Obese SKIN:  Skin is warm and dry.  HEAD: Normal in size and contour. No evidence of trauma EYES: Lids open and close normally. No blepharitis, entropion or ectropion.  EARS: Pinnae are normal. Bilateral ears hard of hearing MOUTH and THROAT: Lips are without lesions. Oral mucosa is moist and without lesions. Tongue is normal in shape, size, and color and without lesions RESPIRATORY: breathing is even & unlabored, BS CTAB CARDIAC: RRR, no murmur,no extra heart sounds, no edema GI: abdomen soft, normal BS, no masses, no tenderness, no hepatomegaly, no splenomegaly EXTREMITIES:  Able to move X 4 extremities PSYCHIATRIC: Alert to self and place, disoriented to time. Affect and behavior are appropriate   LABS/RADIOLOGY: Labs reviewed: Basic Metabolic Panel:  Recent Labs  08/02/16 11/24/16  12/31/16 0910  NA 135* 142 138  K 4.9 5.0 5.0  CL  --   --  97  CO2  --   --  22  GLUCOSE  --   --  190*  BUN 26* 23* 22  CREATININE 1.5* 1.3 1.54*  CALCIUM  --   --  9.6   Liver Function Tests:  Recent Labs  11/24/16 12/31/16 0910  AST 8* 12  ALT 7* 11  ALKPHOS 65 65  BILITOT  --  0.2  PROT  --  7.2  ALBUMIN  --  3.9    Recent Labs  12/31/16 0910  AMMONIA 51   CBC:  Recent Labs  11/24/16 12/31/16 0910 02/17/17  WBC 5.9 6.5 5.5  NEUTROABS  --  3.9  --   HGB 13.9 12.4* 13.4*  HCT 44 39.1 41  MCV  --  93  --   PLT 185 187 168   Lipid Panel:  Recent Labs  04/08/17  HDL 38    ASSESSMENT/PLAN:  1. Seizure ([place]) - no recent seizure episode, Continue Valproic acid 500 mg TID, check Valproic acid level and CBC   2. Essential hypertension - well-controlled; continue amlodipine 5 mg 1 tab daily and Lisinopril 5 mg 1 tab daily   3. 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 Type 2 diabetes mellitus with stage 3 chronic kidney disease, without long-term current use of insulin (HCC) - continue Lantus 100 units/mL inject 45 units subcutaneous daily at bedtime, NovoLog 100 units/mL 10 units subcutaneous before meals and Glipizide 5 mg 1 tab twice a day and Lisinopril 5 mg daily to prevent diabetic nephropathy Lab Results  Component Value Date   HGBA1C 7.9 02/19/2017     5. Chronic depression - Continue sertraline 50 mg 1 tab daily, followed-up by [facility] NP   6. Mixed hyperlipidemia - recently increased Pravastatin from 10 mg to 20 mg daily Lab Results  Component Value Date   CHOL 212 (A) 04/08/2017   HDL 38 04/08/2017   LDLCALC 96 04/08/2017   TRIG 392 (A) 04/08/2017   CHOLHDL 2.2 05/23/2013     7. Chronic renal insufficiency, stage III (moderate) - will monitor, currently on Lisinopril Lab Results  Component Value Date   CREATININE 1.54 (H) 12/31/2016       Goals of care:  Long-term  care     Monina C. Bunker - NP    [employer] [phone]

## 2017-05-13 DIAGNOSIS — I10 Essential (primary) hypertension: Secondary | ICD-10-CM | POA: Diagnosis not present

## 2017-05-13 DIAGNOSIS — D649 Anemia, unspecified: Secondary | ICD-10-CM | POA: Diagnosis not present

## 2017-05-13 DIAGNOSIS — R569 Unspecified convulsions: Secondary | ICD-10-CM | POA: Diagnosis not present

## 2017-05-13 LAB — HEPATIC FUNCTION PANEL
ALT: 11 (ref 10–40)
AST: 10 — AB (ref 14–40)
Alkaline Phosphatase: 62 (ref 25–125)
Bilirubin, Total: 0.2

## 2017-05-13 LAB — BASIC METABOLIC PANEL
BUN: 24 — AB (ref 4–21)
Creatinine: 1.2 (ref 0.6–1.3)
Glucose: 171
Potassium: 5.5 — AB (ref 3.4–5.3)
SODIUM: 135 — AB (ref 137–147)

## 2017-05-13 LAB — CBC AND DIFFERENTIAL
HCT: 37 — AB (ref 41–53)
Hemoglobin: 12.1 — AB (ref 13.5–17.5)
Neutrophils Absolute: 3
Platelets: 191 (ref 150–399)
WBC: 5.3

## 2017-05-29 ENCOUNTER — Non-Acute Institutional Stay (SKILLED_NURSING_FACILITY): Payer: Medicare Other | Admitting: Adult Health

## 2017-05-29 ENCOUNTER — Encounter: Payer: Self-pay | Admitting: Adult Health

## 2017-05-29 DIAGNOSIS — J189 Pneumonia, unspecified organism: Secondary | ICD-10-CM | POA: Diagnosis not present

## 2017-05-29 DIAGNOSIS — R0689 Other abnormalities of breathing: Secondary | ICD-10-CM | POA: Diagnosis not present

## 2017-05-29 NOTE — Progress Notes (Signed)
DATE:  05/29/2017   MRN:  [id number]  BIRTHDAY: [birth year]  Facility:  Nursing Home Location:  [facility] Room Number: 110-B  LEVEL OF CARE:  SNF (31)  Contact Information    Name Relation Home Work Mobile   [place] Friend [phone]     Hicks,Nancy Significant other [phone]     Chaya Jan    [phone]       Code Status History    Date Active Date Inactive Code Status Order ID Comments User Context   12/30/2015  4:39 PM 01/02/2016  6:36 PM Full Code [id number]  Collier Salina, MD Inpatient   11/20/2015  5:29 AM 11/21/2015 10:28 PM Full Code [id number]  Bethena Roys, MD ED   10/10/2015  7:59 PM 10/11/2015  3:25 PM Full Code [id number]  Harvel Quale, MD ED   05/04/2015 11:34 AM 05/07/2015  8:52 PM Full Code [id number]  Bethena Roys, MD Inpatient   02/26/2014 12:36 AM 03/01/2014  8:13 AM Full Code [id number]  Olga Millers, MD Inpatient   12/15/2013  4:39 AM 12/17/2013  9:12 PM Full Code [id number]  Charlann Lange, MD Inpatient   09/03/2012 12:44 AM 09/04/2012  6:39 PM Full Code [id number]  Mackie Pai., MD ED   08/21/2012  4:25 AM 08/22/2012  7:10 PM Full Code [id number]  Hester Mates, MD Inpatient       Chief Complaint  Patient presents with  . Acute Visit    Cough    HISTORY OF PRESENT ILLNESS:  This is a 71-YO male seen for an acute visit secondary to cough.  He is a long-term care resident at [facility].  He has a PMH of alcohol abuse, alcohol withdrawal seizure, anemia, cataract, hearing impairment, stage III chronic renal insufficiency, depression, HTN, HLD, and type 2 DM. He was seen in the room today. He was noted to have productive cough with whitish phlegm. He was reported to have fever last night. He was eating breakfast and has good appetite. Rales noted on left lung and faint wheezing on right lung. No SOB.    PAST MEDICAL HISTORY:  Past Medical History:  Diagnosis Date  .  Abnormality of gait 01/14/2016  . Alcohol abuse 2009   dependency as exhibited by ETOH withdrawal seizure.   . Alcohol withdrawal seizure ([place]) 2013  . Anemia 2011   Normocytic.   . Cataract   . Chronic renal insufficiency, stage III (moderate) 02/26/2014  . Depression with anxiety   . Hearing impairment   . HTN (hypertension), benign 02/26/2014  . Hyperlipemia   . Hypertension   . Seizures ([place])   . Type II or unspecified type diabetes mellitus with neurological manifestations, not stated as uncontrolled(250.60) 02/26/2014     CURRENT MEDICATIONS: Reviewed  Patient's Medications  New Prescriptions   No medications on file  Previous Medications   ACETAMINOPHEN (TYLENOL) 500 MG TABLET    1,000 mg every 12 (twelve) hours. Take 2 tablets by mouth every morning and every evening for pain.    AMLODIPINE (NORVASC) 5 MG TABLET    Take 5 mg by mouth daily.    ASPIRIN (ASPIRIN EC) 81 MG EC TABLET    Take 81 mg by mouth daily. Swallow whole.   FOLIC ACID (FOLVITE) 1 MG TABLET    Take 1 tablet (1 mg total) by mouth daily.   GLIPIZIDE (GLUCOTROL) 5 MG TABLET    Take 5 mg by mouth 2 (  two) times daily with a meal.    GUAIFENESIN (MUCINEX) 600 MG 12 HR TABLET    Take 600 mg by mouth 2 (two) times daily.   HYDROCORTISONE CREAM 1 %    Apply 1 application topically 2 (two) times daily as needed.    INSULIN ASPART (NOVOLOG) 100 UNIT/ML INJECTION    Inject 10 Units into the skin 3 (three) times daily before meals. Give 10 units SQ with meals. Call MD if CBG >400.   INSULIN GLARGINE (LANTUS) 100 UNIT/ML INJECTION    Inject 45 Units into the skin at bedtime.    IPRATROPIUM-ALBUTEROL (DUONEB) 0.5-2.5 (3) MG/3ML SOLN    Take 3 mLs by nebulization every 6 (six) hours as needed.   LISINOPRIL (PRINIVIL,ZESTRIL) 5 MG TABLET    Take 5 mg by mouth daily.   PRAVASTATIN (PRAVACHOL) 20 MG TABLET    Take 20 mg by mouth at bedtime.   PROPYLENE GLYCOL (SYSTANE BALANCE OP)    One drop in each eye daily for dry eye    QUETIAPINE (SEROQUEL) 100 MG TABLET    Take 100 mg by mouth 2 (two) times daily.    SERTRALINE (ZOLOFT) 50 MG TABLET    Take 50 mg by mouth daily.   THIAMINE (VITAMIN B-1) 100 MG TABLET    Take 100 mg by mouth daily.   VALPROIC ACID (DEPAKENE) 250 MG/5ML SYRUP    Take 10 mLs (500 mg total) by mouth 3 (three) times daily.  Modified Medications   No medications on file  Discontinued Medications   PRAVASTATIN (PRAVACHOL) 10 MG TABLET    Take 20 mg by mouth at bedtime.     No Known Allergies   REVIEW OF SYSTEMS:  GENERAL: +fever MOUTH and THROAT: Denies oral discomfort, gingival pain or bleeding, pain from teeth or hoarseness   RESPIRATORY: no SOB, DOE or hemoptysis, +cough and wheezing CARDIAC: no chest pain, edema or palpitations GI: no abdominal pain, diarrhea, constipation, heart burn, nausea or vomiting GU: Denies dysuria, frequency, hematuria, incontinence, or discharge PSYCHIATRIC: Denies feeling of depression or anxiety. No report of hallucinations, insomnia, paranoia, or agitation     PHYSICAL EXAMINATION  GENERAL APPEARANCE: Well nourished.  SKIN:  Skin is warm and dry.  MOUTH and THROAT: Lips are without lesions. Oral mucosa is moist and without lesions. Tongue is normal in shape, size, and color and without lesions RESPIRATORY: breathing is even & unlabored, BS CTAB CARDIAC: RRR, no murmur,no extra heart sounds, no edema GI: abdomen soft, normal BS, no masses, no tenderness, no hepatomegaly, no splenomegaly EXTREMITIES:  Able to move X 4 extremities PSYCHIATRIC: Alert and oriented X 3. Affect and behavior are appropriate   LABS/RADIOLOGY: Labs reviewed: Basic Metabolic Panel:  Recent Labs  11/24/16 12/31/16 0910 05/13/17  NA 142 138 135*  K 5.0 5.0 5.5*  CL  --  97  --   CO2  --  22  --   GLUCOSE  --  190*  --   BUN 23* 22 24*  CREATININE 1.3 1.54* 1.2  CALCIUM  --  9.6  --    Liver Function Tests:  Recent Labs  11/24/16 12/31/16 0910 05/13/17  AST  8* 12 10*  ALT 7* 11 11  ALKPHOS 65 65 62  BILITOT  --  0.2  --   PROT  --  7.2  --   ALBUMIN  --  3.9  --     Recent Labs  12/31/16 0910  AMMONIA 51   CBC:  Recent Labs  12/31/16 0910 02/17/17 05/13/17  WBC 6.5 5.5 5.3  NEUTROABS 3.9  --  3  HGB 12.4* 13.4* 12.1*  HCT 39.1 41 37*  MCV 93  --   --   PLT 187 168 191   Lipid Panel:  Recent Labs  04/08/17  HDL 38    ASSESSMENT/PLAN:  1. HCAP (healthcare-associated pneumonia) - CXR showed bibasilar atelectasis or pneumonia, start doxycycline 100 mg 1 tab by mouth twice a day 10 days,  Florastor 250 mg 1 capsule by mouth twice a day 13 days and Mucinex 600 mg by mouth twice a day 1 week      William [name] C. Hugo - NP    [employer] [phone]

## 2017-06-02 ENCOUNTER — Encounter: Payer: Self-pay | Admitting: Adult Health

## 2017-06-02 ENCOUNTER — Non-Acute Institutional Stay (SKILLED_NURSING_FACILITY): Payer: Medicare Other | Admitting: Adult Health

## 2017-06-02 DIAGNOSIS — L219 Seborrheic dermatitis, unspecified: Secondary | ICD-10-CM

## 2017-06-02 NOTE — Progress Notes (Signed)
DATE:  06/02/2017   MRN:  [id number]  BIRTHDAY: [birth year]  Facility:  Nursing Home Location:  [facility] [place] Room Number: 110-B  LEVEL OF CARE:  SNF (31)  Contact Information    Name Relation Home Work Mobile   [place] Friend [phone]     Hicks,Nancy Significant other [phone]     Chaya Jan    [phone]       Code Status History    Date Active Date Inactive Code Status Order ID Comments User Context   12/30/2015  4:39 PM 01/02/2016  6:36 PM Full Code [id number]  Collier Salina, MD Inpatient   11/20/2015  5:29 AM 11/21/2015 10:28 PM Full Code [id number]  Bethena Roys, MD ED   10/10/2015  7:59 PM 10/11/2015  3:25 PM Full Code [id number]  Harvel Quale, MD ED   05/04/2015 11:34 AM 05/07/2015  8:52 PM Full Code [id number]  Bethena Roys, MD Inpatient   02/26/2014 12:36 AM 03/01/2014  8:13 AM Full Code [id number]  Olga Millers, MD Inpatient   12/15/2013  4:39 AM 12/17/2013  9:12 PM Full Code [id number]  Charlann Lange, MD Inpatient   09/03/2012 12:44 AM 09/04/2012  6:39 PM Full Code [id number]  Mackie Pai., MD ED   08/21/2012  4:25 AM 08/22/2012  7:10 PM Full Code [id number]  Hester Mates, MD Inpatient       Chief Complaint  Patient presents with  . Acute Visit    Seborrheic dermatitis    HISTORY OF PRESENT ILLNESS:  This is a 7-YO male seen for an acute visit secondary to complaints of a red, scaly face.  He is a long-term care resident of [facility].  He has a PMH of alcohol abuse, alcohol withdrawal seizure, anemia, cataracts, hearing impairment, stage III chronic renal insufficiency, depression, HTN, HLD, and type 2 DM. He was seen in the room today. He complained that his face has been itching. Noted some white flakes on his pants and verbalized that they are from his face.       PAST MEDICAL HISTORY:  Past Medical History:  Diagnosis Date  . Abnormality of gait 01/14/2016  . Alcohol  abuse 2009   dependency as exhibited by ETOH withdrawal seizure.   . Alcohol withdrawal seizure ([place]) 2013  . Anemia 2011   Normocytic.   . Cataract   . Chronic renal insufficiency, stage III (moderate) 02/26/2014  . Depression with anxiety   . Hearing impairment   . HTN (hypertension), benign 02/26/2014  . Hyperlipemia   . Hypertension   . Seizures ([place])   . Type II or unspecified type diabetes mellitus with neurological manifestations, not stated as uncontrolled(250.60) 02/26/2014     CURRENT MEDICATIONS: Reviewed  Patient's Medications  New Prescriptions   No medications on file  Previous Medications   ACETAMINOPHEN (TYLENOL) 500 MG TABLET    1,000 mg every 12 (twelve) hours. Take 2 tablets by mouth every morning and every evening for pain.    AMLODIPINE (NORVASC) 5 MG TABLET    Take 5 mg by mouth daily.    ASPIRIN (ASPIRIN EC) 81 MG EC TABLET    Take 81 mg by mouth daily. Swallow whole.   DOXYCYCLINE (MONODOX) 100 MG CAPSULE    Take 100 mg by mouth 2 (two) times daily.   FOLIC ACID (FOLVITE) 1 MG TABLET    Take 1 tablet (1 mg total) by mouth daily.   GLIPIZIDE (GLUCOTROL)  5 MG TABLET    Take 5 mg by mouth 2 (two) times daily with a meal.    GUAIFENESIN (MUCINEX) 600 MG 12 HR TABLET    Take 600 mg by mouth 2 (two) times daily.   HYDROCORTISONE CREAM 1 %    Apply 1 application topically 2 (two) times daily as needed.    INSULIN ASPART (NOVOLOG) 100 UNIT/ML INJECTION    Inject 10 Units into the skin 3 (three) times daily before meals. Give 10 units SQ with meals. Call MD if CBG >400.   INSULIN GLARGINE (LANTUS) 100 UNIT/ML INJECTION    Inject 45 Units into the skin at bedtime.    IPRATROPIUM-ALBUTEROL (DUONEB) 0.5-2.5 (3) MG/3ML SOLN    Take 3 mLs by nebulization every 6 (six) hours as needed.   LISINOPRIL (PRINIVIL,ZESTRIL) 5 MG TABLET    Take 5 mg by mouth daily.   PRAVASTATIN (PRAVACHOL) 20 MG TABLET    Take 20 mg by mouth at bedtime.   PROPYLENE GLYCOL (SYSTANE BALANCE OP)    One  drop in each eye daily for dry eye   QUETIAPINE (SEROQUEL) 100 MG TABLET    Take 100 mg by mouth 2 (two) times daily.    SACCHAROMYCES BOULARDII (FLORASTOR) 250 MG CAPSULE    Take 250 mg by mouth 2 (two) times daily.   SERTRALINE (ZOLOFT) 50 MG TABLET    Take 50 mg by mouth daily.   THIAMINE (VITAMIN B-1) 100 MG TABLET    Take 100 mg by mouth daily.   VALPROIC ACID (DEPAKENE) 250 MG/5ML SYRUP    Take 10 mLs (500 mg total) by mouth 3 (three) times daily.  Modified Medications   No medications on file  Discontinued Medications   No medications on file     No Known Allergies   REVIEW OF SYSTEMS:  GENERAL: no change in appetite, no fatigue, no weight changes, no fever, chills or weakness SKIN: +facial itching MOUTH and THROAT: Denies oral discomfort, gingival pain or bleeding, pain from teeth or hoarseness   RESPIRATORY: no cough, SOB, DOE, wheezing, hemoptysis CARDIAC: no chest pain, edema or palpitations GI: no abdominal pain, diarrhea, constipation, heart burn, nausea or vomiting PSYCHIATRIC: Denies feeling of depression or anxiety. No report of hallucinations, insomnia, paranoia, or agitation   PHYSICAL EXAMINATION  GENERAL APPEARANCE: Well nourished. In no acute distress. Obese SKIN:  Face has erythematous facial  skin with dry whitish flakes MOUTH and THROAT: Lips are without lesions. Oral mucosa is moist and without lesions. Tongue is normal in shape, size, and color and without lesions RESPIRATORY: breathing is even & unlabored, BS CTAB CARDIAC: RRR, no murmur,no extra heart sounds, no edema GI: abdomen soft, normal BS, no masses, no tenderness, no hepatomegaly, no splenomegaly EXTREMITIES:  Able to move X 4 extremities PSYCHIATRIC: Alert and oriented X 3. Affect and behavior are appropriate    LABS/RADIOLOGY: Labs reviewed: Basic Metabolic Panel:  Recent Labs  11/24/16 12/31/16 0910 05/13/17  NA 142 138 135*  K 5.0 5.0 5.5*  CL  --  97  --   CO2  --  22  --     GLUCOSE  --  190*  --   BUN 23* 22 24*  CREATININE 1.3 1.54* 1.2  CALCIUM  --  9.6  --    Liver Function Tests:  Recent Labs  11/24/16 12/31/16 0910 05/13/17  AST 8* 12 10*  ALT 7* 11 11  ALKPHOS 65 65 62  BILITOT  --  0.2  --  PROT  --  7.2  --   ALBUMIN  --  3.9  --     Recent Labs  12/31/16 0910  AMMONIA 51   CBC:  Recent Labs  12/31/16 0910 02/17/17 05/13/17  WBC 6.5 5.5 5.3  NEUTROABS 3.9  --  3  HGB 12.4* 13.4* 12.1*  HCT 39.1 41 37*  MCV 93  --   --   PLT 187 168 191   Lipid Panel:  Recent Labs  04/08/17  HDL 38    ASSESSMENT/PLAN:  1. Seborrheic dermatitis - will start application of Ketoconazole 2% cream to facial rashes BID X 4 weeks, monitor skin for infection, keep skin clean    Monina C. York - NP    [employer] [phone]

## 2017-06-15 ENCOUNTER — Non-Acute Institutional Stay (SKILLED_NURSING_FACILITY): Payer: Medicare Other | Admitting: Adult Health

## 2017-06-15 ENCOUNTER — Encounter: Payer: Self-pay | Admitting: Adult Health

## 2017-06-15 DIAGNOSIS — F39 Unspecified mood [affective] disorder: Secondary | ICD-10-CM

## 2017-06-15 DIAGNOSIS — I10 Essential (primary) hypertension: Secondary | ICD-10-CM | POA: Diagnosis not present

## 2017-06-15 DIAGNOSIS — E1122 Type 2 diabetes mellitus with diabetic chronic kidney disease: Secondary | ICD-10-CM | POA: Diagnosis not present

## 2017-06-15 DIAGNOSIS — F32A Depression, unspecified: Secondary | ICD-10-CM

## 2017-06-15 DIAGNOSIS — L219 Seborrheic dermatitis, unspecified: Secondary | ICD-10-CM

## 2017-06-15 DIAGNOSIS — F329 Major depressive disorder, single episode, unspecified: Secondary | ICD-10-CM

## 2017-06-15 DIAGNOSIS — N183 Chronic kidney disease, stage 3 unspecified: Secondary | ICD-10-CM

## 2017-06-15 DIAGNOSIS — R569 Unspecified convulsions: Secondary | ICD-10-CM

## 2017-06-15 NOTE — Progress Notes (Signed)
DATE:  06/15/2017   MRN:  [id number]  BIRTHDAY: 1946-01-28  Facility:  Nursing Home Location:  [facility] Room Number: 110-B  LEVEL OF CARE:  SNF (31)  Contact Information    Name Relation Home Work Mobile   [place] Friend [phone]     Hicks,Nancy Significant other [phone]     Chaya Jan    [phone]       Code Status History    Date Active Date Inactive Code Status Order ID Comments User Context   12/30/2015  4:39 PM 01/02/2016  6:36 PM Full Code [id number]  Collier Salina, MD Inpatient   11/20/2015  5:29 AM 11/21/2015 10:28 PM Full Code [id number]  Bethena Roys, MD ED   10/10/2015  7:59 PM 10/11/2015  3:25 PM Full Code [id number]  Harvel Quale, MD ED   05/04/2015 11:34 AM 05/07/2015  8:52 PM Full Code [id number]  Bethena Roys, MD Inpatient   02/26/2014 12:36 AM 03/01/2014  8:13 AM Full Code [id number]  Olga Millers, MD Inpatient   12/15/2013  4:39 AM 12/17/2013  9:12 PM Full Code [id number]  Charlann Lange, MD Inpatient   09/03/2012 12:44 AM 09/04/2012  6:39 PM Full Code [id number]  Mackie Pai., MD ED   08/21/2012  4:25 AM 08/22/2012  7:10 PM Full Code [id number]  Hester Mates, MD Inpatient       Chief Complaint  Patient presents with  . Medical Management of Chronic Issues    Routine visit    HISTORY OF PRESENT ILLNESS:  This is a 89-YO male seen for a routine visit.  He is a long-term care resident at [facility].  He has a PMH of alcohol abuse, alcohol withdrawal seizure, cataract, anemia, stage III chronic renal insufficiency, depression, hearing impairment, HTN, HLD, and DM type 2. He was seen in the room today and did not verbalize any concerns. He was recently treated for seborrheic dermatitis with Ketoconazole cream. Noted face in no longer flaky and red. He did not complain of itching.      PAST MEDICAL HISTORY:  Past Medical History:  Diagnosis Date  . Abnormality of  gait 01/14/2016  . Alcohol abuse 2009   dependency as exhibited by ETOH withdrawal seizure.   . Alcohol withdrawal seizure ([place]) 2013  . Anemia 2011   Normocytic.   . Cataract   . Chronic renal insufficiency, stage III (moderate) 02/26/2014  . Depression with anxiety   . Hearing impairment   . HTN (hypertension), benign 02/26/2014  . Hyperlipemia   . Hypertension   . Seizures ([place])   . Type II or unspecified type diabetes mellitus with neurological manifestations, not stated as uncontrolled(250.60) 02/26/2014     CURRENT MEDICATIONS: Reviewed  Patient's Medications  New Prescriptions   No medications on file  Previous Medications   ACETAMINOPHEN (TYLENOL) 500 MG TABLET    1,000 mg every 12 (twelve) hours. Take 2 tablets by mouth every morning and every evening for pain.    AMLODIPINE (NORVASC) 5 MG TABLET    Take 5 mg by mouth daily.    ASPIRIN (ASPIRIN EC) 81 MG EC TABLET    Take 81 mg by mouth daily. Swallow whole.   FOLIC ACID (FOLVITE) 1 MG TABLET    Take 1 tablet (1 mg total) by mouth daily.   GLIPIZIDE (GLUCOTROL) 5 MG TABLET    Take 5 mg by mouth 2 (two) times daily with a meal.  HYDROCORTISONE CREAM 1 %    Apply 1 application topically 2 (two) times daily as needed.    INSULIN ASPART (NOVOLOG) 100 UNIT/ML INJECTION    Inject 10 Units into the skin 3 (three) times daily before meals. Give 10 units SQ with meals. Call MD if CBG >400.   INSULIN GLARGINE (LANTUS) 100 UNIT/ML INJECTION    Inject 45 Units into the skin at bedtime.    IPRATROPIUM-ALBUTEROL (DUONEB) 0.5-2.5 (3) MG/3ML SOLN    Take 3 mLs by nebulization every 6 (six) hours as needed.   KETOCONAZOLE (NIZORAL) 2 % CREAM    Apply 1 application topically 2 (two) times daily. Apply to rash on face BID   LISINOPRIL (PRINIVIL,ZESTRIL) 5 MG TABLET    Take 5 mg by mouth daily.   PRAVASTATIN (PRAVACHOL) 20 MG TABLET    Take 20 mg by mouth at bedtime.   PROPYLENE GLYCOL (SYSTANE BALANCE OP)    One drop in each eye daily for dry  eye   QUETIAPINE (SEROQUEL) 100 MG TABLET    Take 100 mg by mouth 2 (two) times daily.    SERTRALINE (ZOLOFT) 50 MG TABLET    Take 50 mg by mouth daily.   THIAMINE (VITAMIN B-1) 100 MG TABLET    Take 100 mg by mouth daily.   VALPROIC ACID (DEPAKENE) 250 MG/5ML SYRUP    Take 10 mLs (500 mg total) by mouth 3 (three) times daily.  Modified Medications   No medications on file  Discontinued Medications   No medications on file     No Known Allergies   REVIEW OF SYSTEMS:  GENERAL: no change in appetite, no fatigue, no weight changes, no fever, chills or weakness MOUTH and THROAT: Denies oral discomfort, gingival pain or bleeding, pain from teeth or hoarseness   RESPIRATORY: no cough, SOB, DOE, wheezing, hemoptysis CARDIAC: no chest pain, edema or palpitations GI: no abdominal pain, diarrhea, constipation, heart burn, nausea or vomiting GU: Denies dysuria, frequency, hematuria, incontinence, or discharge PSYCHIATRIC: Denies feeling of depression or anxiety. No report of hallucinations, insomnia, paranoia, or agitation    PHYSICAL EXAMINATION  GENERAL APPEARANCE: Well nourished. In no acute distress. Obese SKIN:  Skin is warm and dry.  MOUTH and THROAT: Lips are without lesions. Oral mucosa is moist and without lesions.  RESPIRATORY: breathing is even & unlabored, BS CTAB CARDIAC: RRR, no murmur,no extra heart sounds, no edema GI: abdomen soft, normal BS, no masses, no tenderness, no hepatomegaly, no splenomegaly EXTREMITIES:  Able to move X 4 extremities PSYCHIATRIC: Alert and oriented X 3. Affect and behavior are appropriate   LABS/RADIOLOGY: Labs reviewed: Basic Metabolic Panel:  Recent Labs  11/24/16 12/31/16 0910 05/13/17  NA 142 138 135*  K 5.0 5.0 5.5*  CL  --  97  --   CO2  --  22  --   GLUCOSE  --  190*  --   BUN 23* 22 24*  CREATININE 1.3 1.54* 1.2  CALCIUM  --  9.6  --    Liver Function Tests:  Recent Labs  11/24/16 12/31/16 0910 05/13/17  AST 8* 12 10*   ALT 7* 11 11  ALKPHOS 65 65 62  BILITOT  --  0.2  --   PROT  --  7.2  --   ALBUMIN  --  3.9  --     Recent Labs  12/31/16 0910  AMMONIA 51   CBC:  Recent Labs  12/31/16 0910 02/17/17 05/13/17  WBC 6.5 5.5 5.3  NEUTROABS 3.9  --  3  HGB 12.4* 13.4* 12.1*  HCT 39.1 41 37*  MCV 93  --   --   PLT 187 168 191   Lipid Panel:  Recent Labs  04/08/17  HDL 38    ASSESSMENT/PLAN:  1. Seizure ([place]) - no seizure episode, continue Valproic acid 250 mg/5 ml give 10 ml = 500 mg PO TID   2. Essential hypertension - well controlled, continue this in the preop 5 mg daily and amlodipine 5 mg 1 tab daily   3. Type 2 diabetes mellitus with stage 3 chronic kidney disease, without long-term current use of insulin (HCC) - Well-controlled, continue glipizide 5 mg 1 tab twice a day, NovoLog 100 units/mL give 10 units subcutaneous before meals and Lantus 100 units/mL inject 45 units subcutaneous daily at bedtime,  CBG ACHS Lab Results  Component Value Date   HGBA1C 7.9 02/19/2017     4. 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 Chronic depression - continue sertraline 50 milligrams daily, followed up by [facility] NP   6. Seborrheic dermatitis - continue ketoconazole 2% cream topically to rashes on face twice a day     Goals of care:  Long-term care     Monina C. [place] - NP    [employer] [phone]

## 2017-06-30 DIAGNOSIS — R569 Unspecified convulsions: Secondary | ICD-10-CM | POA: Diagnosis not present

## 2017-06-30 DIAGNOSIS — I10 Essential (primary) hypertension: Secondary | ICD-10-CM | POA: Diagnosis not present

## 2017-06-30 DIAGNOSIS — Z79899 Other long term (current) drug therapy: Secondary | ICD-10-CM | POA: Diagnosis not present

## 2017-06-30 DIAGNOSIS — N390 Urinary tract infection, site not specified: Secondary | ICD-10-CM | POA: Diagnosis not present

## 2017-06-30 DIAGNOSIS — Z7901 Long term (current) use of anticoagulants: Secondary | ICD-10-CM | POA: Diagnosis not present

## 2017-06-30 DIAGNOSIS — R319 Hematuria, unspecified: Secondary | ICD-10-CM | POA: Diagnosis not present

## 2017-07-02 ENCOUNTER — Encounter: Payer: Self-pay | Admitting: Internal Medicine

## 2017-07-02 ENCOUNTER — Non-Acute Institutional Stay (SKILLED_NURSING_FACILITY): Payer: Medicare Other | Admitting: Internal Medicine

## 2017-07-02 DIAGNOSIS — N3941 Urge incontinence: Secondary | ICD-10-CM

## 2017-07-02 DIAGNOSIS — E785 Hyperlipidemia, unspecified: Secondary | ICD-10-CM | POA: Diagnosis not present

## 2017-07-02 DIAGNOSIS — E0829 Diabetes mellitus due to underlying condition with other diabetic kidney complication: Secondary | ICD-10-CM | POA: Diagnosis not present

## 2017-07-02 DIAGNOSIS — E0865 Diabetes mellitus due to underlying condition with hyperglycemia: Secondary | ICD-10-CM | POA: Diagnosis not present

## 2017-07-02 DIAGNOSIS — R300 Dysuria: Secondary | ICD-10-CM

## 2017-07-02 DIAGNOSIS — Z79899 Other long term (current) drug therapy: Secondary | ICD-10-CM | POA: Diagnosis not present

## 2017-07-02 DIAGNOSIS — D649 Anemia, unspecified: Secondary | ICD-10-CM | POA: Diagnosis not present

## 2017-07-02 DIAGNOSIS — IMO0002 Reserved for concepts with insufficient information to code with codable children: Secondary | ICD-10-CM

## 2017-07-02 DIAGNOSIS — R319 Hematuria, unspecified: Secondary | ICD-10-CM | POA: Diagnosis not present

## 2017-07-02 DIAGNOSIS — R8290 Unspecified abnormal findings in urine: Secondary | ICD-10-CM | POA: Diagnosis not present

## 2017-07-02 DIAGNOSIS — E119 Type 2 diabetes mellitus without complications: Secondary | ICD-10-CM | POA: Diagnosis not present

## 2017-07-02 DIAGNOSIS — N390 Urinary tract infection, site not specified: Secondary | ICD-10-CM | POA: Diagnosis not present

## 2017-07-02 NOTE — Patient Instructions (Signed)
See assessment and plan under each diagnosis in the problem list and acutely for this visit 

## 2017-07-02 NOTE — Progress Notes (Signed)
NURSING HOME LOCATION:  [facility] ROOM NUMBER:  110-B  CODE STATUS:  Full Code  PCP:  Hendricks Limes, MD  [address] [place] [address]  This is a nursing facility follow up for specific acute issue of abnormal urinalysis.  Interim medical record and care since last [facility] visit was updated with review of diagnostic studies and change in clinical status since last visit were documented.  HPI: The patient is describing urgency, slight dysuria and incontinence. Urinalysis 06/30/17 revealed 20-30 WBC, 30-50 red cells, and 1+ bacteria. Final culture and sensitivity is pending. The patient is a long-term resident of this facility with medical diagnoses of essential hypertension, mixed type COPD, diabetes with renal complication, chronic renal insufficiency stage III, and psychosis. He is on both basal and short acting insulin before meals. Fasting blood sugars have ranged from 143-313. Lunch glucoses have ranged from 127-205. Dinner values range from 128-320. Bedtime values range from 145-320. There is no hypoglycemia. His most recent A1c was 7.9% on 02/19/17. Hepatorenal values have been normal to low, so dramatic discordance between the glucoses and A1c is less likely. He has been noncompliant with carb & sugar restriction Psychiatry follows the patient and he is on Seroquel and Zoloft.   Review of systems: He states that he is recovering from his recent respiratory tract infection. He describes scant yellow-white sputum. He describes occasional numbness and tingling of the right hand. He has difficulty eating because of lack of dentures. At the dental clinic and was told that he could not be fitted because of loss of bone.  Constitutional: No fever,significant weight change, fatigue  Eyes: No redness, discharge, pain, vision change ENT/mouth: No nasal congestion,  purulent discharge, earache,change in hearing ,sore throat  Cardiovascular: No chest pain,  palpitations,paroxysmal nocturnal dyspnea, claudication, edema  Respiratory: No hemoptysis, DOE , significant snoring,apnea  Gastrointestinal: No heartburn,dysphagia,abdominal pain, nausea / vomiting,rectal bleeding, melena,change in bowels Genitourinary: No hematuria, pyuria Musculoskeletal: No joint stiffness, joint swelling, weakness,pain Dermatologic: No rash, pruritus, change in appearance of skin Neurologic: No dizziness,headache,syncope, seizures Psychiatric: No significant anxiety , depression, insomnia, anorexia Endocrine: No change in hair/skin/ nails, excessive thirst, excessive hunger, excessive urination  Hematologic/lymphatic: No significant bruising, lymphadenopathy,abnormal bleeding Allergy/immunology: No itchy/ watery eyes, significant sneezing, urticaria, angioedema  Physical exam:  Pertinent or positive findings: He has pattern alopecia. He's markedly hard of hearing. Ptosis is present on the left.Pupillary light reflexes are bright suggesting prior cataract surgery. A large plug of smokeless tobacco in his mouth. He is edentulous. He has diffuse low-grade musical rhonchi. The abdomen is massive. 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 Pedal pulses are decreased. He has splotchy irregular vitiligo over the forearms, greater on the right.  General appearance:Adequately nourished; no acute distress , increased work of breathing is present.   Lymphatic: No lymphadenopathy about the head, neck, axilla . Eyes: No conjunctival inflammation or lid edema is present. There is no scleral icterus. Ears:  External ear exam shows no significant lesions or deformities.   Nose:  External nasal examination shows no deformity or inflammation. Nasal mucosa are pink and moist without lesions ,exudates Oral exam: lips and gums are healthy appearing.There is no oropharyngeal erythema or exudate . Neck:  No thyromegaly, masses,  tenderness noted.    Heart:  Normal rate and regular rhythm. S1 and S2 normal without gallop, murmur, click, rub .  Lungs: without  wheezes,rales , rubs. Abdomen:Bowel sounds are normal. Abdomen is soft and nontender with no organomegaly, hernias,masses. GU: deferred  Extremities:  No cyanosis, clubbing,edema  Skin: Warm & dry w/o tenting. No significant lesions or rash.  See summary for each active diagnosis

## 2017-07-02 NOTE — Assessment & Plan Note (Addendum)
Repeat A1c Dietary interventions to control hyperglycemia discussed with the patient, he appears to be motivated to make dietary changes

## 2017-07-08 DIAGNOSIS — I10 Essential (primary) hypertension: Secondary | ICD-10-CM | POA: Diagnosis not present

## 2017-07-08 DIAGNOSIS — N390 Urinary tract infection, site not specified: Secondary | ICD-10-CM | POA: Diagnosis not present

## 2017-07-08 DIAGNOSIS — Z7901 Long term (current) use of anticoagulants: Secondary | ICD-10-CM | POA: Diagnosis not present

## 2017-07-08 DIAGNOSIS — N183 Chronic kidney disease, stage 3 (moderate): Secondary | ICD-10-CM | POA: Diagnosis not present

## 2017-07-08 DIAGNOSIS — R319 Hematuria, unspecified: Secondary | ICD-10-CM | POA: Diagnosis not present

## 2017-07-08 DIAGNOSIS — Z79899 Other long term (current) drug therapy: Secondary | ICD-10-CM | POA: Diagnosis not present

## 2017-07-17 ENCOUNTER — Non-Acute Institutional Stay (SKILLED_NURSING_FACILITY): Payer: Medicare Other | Admitting: Adult Health

## 2017-07-17 ENCOUNTER — Encounter: Payer: Self-pay | Admitting: Adult Health

## 2017-07-17 DIAGNOSIS — N183 Chronic kidney disease, stage 3 unspecified: Secondary | ICD-10-CM

## 2017-07-17 DIAGNOSIS — L219 Seborrheic dermatitis, unspecified: Secondary | ICD-10-CM

## 2017-07-17 DIAGNOSIS — F39 Unspecified mood [affective] disorder: Secondary | ICD-10-CM

## 2017-07-17 DIAGNOSIS — I10 Essential (primary) hypertension: Secondary | ICD-10-CM | POA: Diagnosis not present

## 2017-07-17 DIAGNOSIS — L03115 Cellulitis of right lower limb: Secondary | ICD-10-CM

## 2017-07-17 DIAGNOSIS — F29 Unspecified psychosis not due to a substance or known physiological condition: Secondary | ICD-10-CM | POA: Diagnosis not present

## 2017-07-17 DIAGNOSIS — E1122 Type 2 diabetes mellitus with diabetic chronic kidney disease: Secondary | ICD-10-CM

## 2017-07-17 DIAGNOSIS — F32A Depression, unspecified: Secondary | ICD-10-CM

## 2017-07-17 DIAGNOSIS — F329 Major depressive disorder, single episode, unspecified: Secondary | ICD-10-CM | POA: Diagnosis not present

## 2017-07-17 NOTE — Progress Notes (Signed)
DATE:  07/17/2017   MRN:  [id number]  BIRTHDAY: 15-May-1946  Facility:  Nursing Home Location:  [facility] Room Number: 110-B  LEVEL OF CARE:  SNF (31)  Contact Information    Name Relation Home Work Mobile   [place] Friend [phone]     Hicks,Nancy Significant other [phone]     Chaya Jan    [phone]       Code Status History    Date Active Date Inactive Code Status Order ID Comments User Context   12/30/2015  4:39 PM 01/02/2016  6:36 PM Full Code [id number]  Collier Salina, MD Inpatient   11/20/2015  5:29 AM 11/21/2015 10:28 PM Full Code [id number]  Bethena Roys, MD ED   10/10/2015  7:59 PM 10/11/2015  3:25 PM Full Code [id number]  Harvel Quale, MD ED   05/04/2015 11:34 AM 05/07/2015  8:52 PM Full Code [id number]  Bethena Roys, MD Inpatient   02/26/2014 12:36 AM 03/01/2014  8:13 AM Full Code [id number]  Olga Millers, MD Inpatient   12/15/2013  4:39 AM 12/17/2013  9:12 PM Full Code [id number]  Charlann Lange, MD Inpatient   09/03/2012 12:44 AM 09/04/2012  6:39 PM Full Code [id number]  Mackie Pai., MD ED   08/21/2012  4:25 AM 08/22/2012  7:10 PM Full Code [id number]  Hester Mates, MD Inpatient       Chief Complaint  Patient presents with  . Medical Management of Chronic Issues    Routine visit    HISTORY OF PRESENT ILLNESS:  This is a 33-YO male seen for a routine visit.  He is a long-term care resident of [facility].  He has a PMH of alcohol abuse, alcohol withdrawal seizure, cataract, anemia, stage III chronic renal insufficiency, depression, hearing impairment, HTN, HLT, and DM2. He was seen in his room today. He was noted to have right shin wound, skin is warm to touch and tender. He said he was putting his pants one day and "hit the edge of the bedside table." Review of CBGs -243, 244, 259, 142, 210, elevated. He complains that he urinates a lot. Urinalysis with culture done did  not show UTI.      PAST MEDICAL HISTORY:  Past Medical History:  Diagnosis Date  . Abnormality of gait 01/14/2016  . Alcohol abuse 2009   dependency as exhibited by ETOH withdrawal seizure.   . Alcohol withdrawal seizure ([place]) 2013  . Anemia 2011   Normocytic.   . Cataract   . Chronic renal insufficiency, stage III (moderate) ([place]) 02/26/2014  . Depression with anxiety   . Hearing impairment   . HTN (hypertension), benign 02/26/2014  . Hyperlipemia   . Hypertension   . Seizures ([place])   . Type II or unspecified type diabetes mellitus with neurological manifestations, not stated as uncontrolled(250.60) 02/26/2014     CURRENT MEDICATIONS: Reviewed  Patient's Medications  New Prescriptions   No medications on file  Previous Medications   ACETAMINOPHEN (TYLENOL) 500 MG TABLET    1,000 mg every 12 (twelve) hours. Take 2 tablets by mouth every morning and every evening for pain.    AMLODIPINE (NORVASC) 5 MG TABLET    Take 5 mg by mouth daily.    ASPIRIN (ASPIRIN EC) 81 MG EC TABLET    Take 81 mg by mouth daily. Swallow whole.   FOLIC ACID (FOLVITE) 1 MG TABLET    Take 1 tablet (1 mg total)  by mouth daily.   GLIPIZIDE (GLUCOTROL) 5 MG TABLET    Take 5 mg by mouth 2 (two) times daily with a meal.    HYDROCORTISONE CREAM 1 %    Apply 1 application topically 2 (two) times daily as needed.    INSULIN ASPART (NOVOLOG) 100 UNIT/ML INJECTION    Inject 10 Units into the skin 3 (three) times daily before meals. Give 10 units SQ with meals. Call MD if CBG >400.   INSULIN GLARGINE (LANTUS) 100 UNIT/ML INJECTION    Inject 45 Units into the skin at bedtime.    IPRATROPIUM-ALBUTEROL (DUONEB) 0.5-2.5 (3) MG/3ML SOLN    Take 3 mLs by nebulization every 6 (six) hours as needed.   KETOCONAZOLE (NIZORAL) 2 % CREAM    Apply 1 application topically 2 (two) times daily. Apply to facial rash x4 weeks   LISINOPRIL (PRINIVIL,ZESTRIL) 5 MG TABLET    Take 5 mg by mouth daily.   PRAVASTATIN (PRAVACHOL) 20 MG TABLET     Take 20 mg by mouth at bedtime.   PROPYLENE GLYCOL (SYSTANE BALANCE OP)    One drop in each eye daily for dry eye   QUETIAPINE (SEROQUEL) 100 MG TABLET    Take 100 mg by mouth 2 (two) times daily.    SERTRALINE (ZOLOFT) 50 MG TABLET    Take 50 mg by mouth daily.   THIAMINE (VITAMIN B-1) 100 MG TABLET    Take 100 mg by mouth daily.   VALPROIC ACID (DEPAKENE) 250 MG/5ML SYRUP    Take 10 mLs (500 mg total) by mouth 3 (three) times daily.  Modified Medications   No medications on file  Discontinued Medications   No medications on file     No Known Allergies   REVIEW OF SYSTEMS:  GENERAL: no change in appetite, no fatigue, no weight changes, no fever, chills or weakness SKIN: + wound MOUTH and THROAT: Denies oral discomfort, gingival pain or bleeding   RESPIRATORY: no cough, SOB, DOE, wheezing, hemoptysis CARDIAC: no chest pain, edema or palpitations GI: no abdominal pain, diarrhea, constipation, heart burn, nausea or vomiting GU: +urinary incontinence PSYCHIATRIC: Denies feeling of depression or anxiety. No report of hallucinations, insomnia, paranoia, or agitation    PHYSICAL EXAMINATION  GENERAL APPEARANCE: Well nourished. In no acute distress. Obese SKIN:  Right shin has open wound, erythematous and tender, no drainage noted MOUTH and THROAT: Lips are without lesions. Oral mucosa is moist and without lesions. Chewing snuff RESPIRATORY: breathing is even & unlabored, BS CTAB CARDIAC: RRR, no murmur,no extra heart sounds, no edema GI: abdomen soft, normal BS, no masses, no tenderness, no hepatomegaly, no splenomegaly EXTREMITIES:  Able to move X 4 extremities PSYCHIATRIC: Alert and oriented X 3. Affect and behavior are appropriate   LABS/RADIOLOGY: Labs reviewed: Basic Metabolic Panel:  Recent Labs  11/24/16 12/31/16 0910 05/13/17  NA 142 138 135*  K 5.0 5.0 5.5*  CL  --  97  --   CO2  --  22  --   GLUCOSE  --  190*  --   BUN 23* 22 24*  CREATININE 1.3 1.54* 1.2    CALCIUM  --  9.6  --    Liver Function Tests:  Recent Labs  11/24/16 12/31/16 0910 05/13/17  AST 8* 12 10*  ALT 7* 11 11  ALKPHOS 65 65 62  BILITOT  --  0.2  --   PROT  --  7.2  --   ALBUMIN  --  3.9  --  Recent Labs  12/31/16 0910  AMMONIA 51   CBC:  Recent Labs  12/31/16 0910 02/17/17 05/13/17  WBC 6.5 5.5 5.3  NEUTROABS 3.9  --  3  HGB 12.4* 13.4* 12.1*  HCT 39.1 41 37*  MCV 93  --   --   PLT 187 168 191   Lipid Panel:  Recent Labs  04/08/17  HDL 38    ASSESSMENT/PLAN:   1. Cellulitis of right lower extremity -start Bactrim DS 1 tab twice daily times 7 days and Florastor 250 mg 1 capsule twice a day times 10 days, cleanse right shin wound with NS and cover with dry dressing daily, keep skin clean and dry   2. Seborrheic dermatitis - continue ketoconazole 2% cream apply topically to facial rash twice a day till 08/12/17   3. Essential hypertension -well-controlled, continue amlodipine 5 mg 1 tab daily and lisinopril 5 mg daily, check BMP   4. Chronic depression -stable, sertraline 50 mg 1 tab daily and Quetiapine 100 mg 1 tab twice a day   5. Type 2 diabetes mellitus with stage 3 chronic kidney disease, without long-term current use of insulin (HCC) -CBGs noted to beelevated,  will increase Lantus from 45 units to 48 units subcutaneous nightly, continue glipizide 5 mg 1 tab twice a day and NovoLog 100 units/mL inject 10 units SQ before meals Lab Results  Component Value Date   HGBA1C 7.9 02/19/2017     6. Mood disorder with psychosis ([place]) -mood is stable, continue valproic acid 250 mg / 5 mL give 10 mL = 500 mg 3 times daily  7. Psychosis - stable,  continue Quetiapine 100 mg 1 tab twice a day    Goals of care:  Long-term care    Labs/test ordered:   Monina C. McCleary - NP   [employer] [phone]

## 2017-07-18 DIAGNOSIS — N183 Chronic kidney disease, stage 3 (moderate): Secondary | ICD-10-CM | POA: Diagnosis not present

## 2017-07-18 DIAGNOSIS — I10 Essential (primary) hypertension: Secondary | ICD-10-CM | POA: Diagnosis not present

## 2017-07-18 DIAGNOSIS — E119 Type 2 diabetes mellitus without complications: Secondary | ICD-10-CM | POA: Diagnosis not present

## 2017-07-18 LAB — HEMOGLOBIN A1C: Hemoglobin A1C: 6.3

## 2017-08-13 ENCOUNTER — Encounter: Payer: Self-pay | Admitting: Adult Health

## 2017-08-13 ENCOUNTER — Non-Acute Institutional Stay (SKILLED_NURSING_FACILITY): Payer: Medicare Other | Admitting: Adult Health

## 2017-08-13 DIAGNOSIS — E782 Mixed hyperlipidemia: Secondary | ICD-10-CM

## 2017-08-13 DIAGNOSIS — F39 Unspecified mood [affective] disorder: Secondary | ICD-10-CM

## 2017-08-13 DIAGNOSIS — I10 Essential (primary) hypertension: Secondary | ICD-10-CM

## 2017-08-13 DIAGNOSIS — F32A Depression, unspecified: Secondary | ICD-10-CM

## 2017-08-13 DIAGNOSIS — F329 Major depressive disorder, single episode, unspecified: Secondary | ICD-10-CM | POA: Diagnosis not present

## 2017-08-13 DIAGNOSIS — F29 Unspecified psychosis not due to a substance or known physiological condition: Secondary | ICD-10-CM | POA: Diagnosis not present

## 2017-08-13 DIAGNOSIS — E1165 Type 2 diabetes mellitus with hyperglycemia: Secondary | ICD-10-CM | POA: Diagnosis not present

## 2017-08-13 NOTE — Progress Notes (Signed)
DATE:  08/13/2017   MRN:  [id number]  BIRTHDAY: March 31, 1946  Facility:  N[facility]ing Home Location:  [facility] Room Number: 01/02/16  LEVEL OF CARE:  SNF (31)  Contact Information    Name Relation Home Work Mobile   [place] Friend [phone]     Hicks,Nancy Significant other [phone]     Chaya Jan    [phone]       Code Status History    Date Active Date Inactive Code Status Order ID Comments User Context   12/30/2015 16:39 01/02/2016 18:36 Full Code [id number]  Collier Salina, MD Inpatient   11/20/2015 05:29 11/21/2015 22:28 Full Code [id number]  Bethena Roys, MD ED   10/10/2015 19:59 10/11/2015 15:25 Full Code [id number]  Harvel Quale, MD ED   05/04/2015 11:34 05/07/2015 20:52 Full Code [id number]  Bethena Roys, MD Inpatient   02/26/2014 00:36 03/01/2014 08:13 Full Code [id number]  Olga Millers, MD Inpatient   12/15/2013 04:39 12/17/2013 21:12 Full Code [id number]  Charlann Lange, MD Inpatient   09/03/2012 00:44 09/04/2012 18:39 Full Code [id number]  Mackie Pai., MD ED   08/21/2012 04:25 08/22/2012 19:10 Full Code [id number]  Hester Mates, MD Inpatient       Chief Complaint  Patient presents with  . Medical Management of Chronic Issues    Routine [facility] visit    HISTORY OF PRESENT ILLNESS:  This is a 60-YO male seen for a routine SNF visit.  He is a long-term care resident of [facility].  He has a PMH of alcohol abuse, alcohol withdrawal seizure, cataract, anemia, stage III chronic renal insufficiency, depression, hearing impairment, HTN, HLT, and DM2. Noted CBGs to be elevated - 416, 398, 218. He was recently treated with Ketoconazol for seborrheic dermatitis. He was seen in his room today. He was noted chewing tobacco while lying in bed.    PAST MEDICAL HISTORY:  Past Medical History:  Diagnosis Date  . Abnormality of gait 01/14/2016  . Alcohol abuse 2009   dependency as  exhibited by ETOH withdrawal seizure.   . Alcohol withdrawal seizure ([place]) 2013  . Anemia 2011   Normocytic.   . Cataract   . Chronic renal insufficiency, stage III (moderate) ([place]) 02/26/2014  . Depression with anxiety   . Hearing impairment   . HTN (hypertension), benign 02/26/2014  . Hyperlipemia   . Hypertension   . Seizures ([place])   . Type II or unspecified type diabetes mellitus with neurological manifestations, not stated as uncontrolled(250.60) 02/26/2014     CURRENT MEDICATIONS: Reviewed    Medication List        Accurate as of 08/13/17 10:16 AM. Always use your most recent med list.          acetaminophen 500 MG tablet Commonly known as:  TYLENOL   amLODipine 5 MG tablet Commonly known as:  NORVASC   aspirin EC 81 MG EC tablet Generic drug:  aspirin   folic acid 1 MG tablet Commonly known as:  FOLVITE Take 1 tablet (1 mg total) by mouth daily.   glipiZIDE 5 MG tablet Commonly known as:  GLUCOTROL   hydrocortisone cream 1 %   insulin aspart 100 UNIT/ML injection Commonly known as:  novoLOG   insulin glargine 100 UNIT/ML injection Commonly known as:  LANTUS   ipratropium-albuterol 0.5-2.5 (3) MG/3ML Soln Commonly known as:  DUONEB   lisinopril 5 MG tablet Commonly known as:  PRINIVIL,ZESTRIL   pravastatin  20 MG tablet Commonly known as:  PRAVACHOL   QUEtiapine 100 MG tablet Commonly known as:  SEROQUEL   sertraline 50 MG tablet Commonly known as:  ZOLOFT   SYSTANE BALANCE OP   thiamine 100 MG tablet Commonly known as:  VITAMIN B-1   valproic acid 250 MG/5ML syrup Commonly known as:  DEPAKENE Take 10 mLs (500 mg total) by mouth 3 (three) times daily.        No Known Allergies   REVIEW OF SYSTEMS:  GENERAL: no change in appetite, no fatigue, no weight changes, no fever, chills or weakness MOUTH and THROAT: Denies oral discomfort RESPIRATORY: no cough, SOB, DOE, wheezing, hemoptysis CARDIAC: no chest pain, edema or  palpitations GI: no abdominal pain, diarrhea, constipation, heart burn, nausea or vomiting GU: Denies dysuria, frequency, hematuria, incontinence, or discharge PSYCHIATRIC: Denies feeling of depression or anxiety. No report of hallucinations, insomnia, paranoia, or agitation    PHYSICAL EXAMINATION  GENERAL APPEARANCE: Well nourished. In no acute distress. Obese SKIN:  Skin is warm and dry.  MOUTH and THROAT: Lips are without lesions. Oral mucosa is moist and without lesions.  RESPIRATORY: breathing is even & unlabored, BS CTAB CARDIAC: RRR, no murmur,no extra heart sounds, no edema GI: abdomen soft, normal BS, no masses, no tenderness, no hepatomegaly, no splenomegaly EXTREMITIES: Able to move X 4 extremities PSYCHIATRIC: Alert and oriented X 3. Affect and behavior are appropriate   LABS/RADIOLOGY: Labs reviewed: Basic Metabolic Panel: Recent Labs    11/24/16 12/31/16 0910 05/13/17  NA 142 138 135*  K 5.0 5.0 5.5*  CL  --  97  --   CO2  --  22  --   GLUCOSE  --  190*  --   BUN 23* 22 24*  CREATININE 1.3 1.54* 1.2  CALCIUM  --  9.6  --    Liver Function Tests: Recent Labs    11/24/16 12/31/16 0910 05/13/17  AST 8* 12 10*  ALT 7* 11 11  ALKPHOS 65 65 62  BILITOT  --  0.2  --   PROT  --  7.2  --   ALBUMIN  --  3.9  --     Recent Labs    12/31/16 0910  AMMONIA 51   CBC: Recent Labs    12/31/16 0910 02/17/17 05/13/17  WBC 6.5 5.5 5.3  NEUTROABS 3.9  --  3  HGB 12.4* 13.4* 12.1*  HCT 39.1 41 37*  MCV 93  --   --   PLT 187 168 191   Lipid Panel: Recent Labs    04/08/17  HDL 38    ASSESSMENT/PLAN:  1. Uncontrolled type 2 diabetes mellitus with hyperglycemia (HCC) - increase Lantus 100 units/ml from 48 units to 52 units SQ Q HS , continue NovoLog 100 units/mL inject 10 units subcutaneous before meals for CBGs>400, and glipizide 5 mg 1 tab twice a day Lab Results  Component Value Date   HGBA1C 6.3 07/18/2017     2. Essential hypertension - well  controlled, continue this in the peel 5 mg 1 tab daily and amlodipine 5 mg 1 tab daily   3. Psychosis, unspecified psychosis type ([place]) - mood is stable, continue Quetiapine 100 mg 1 tab twice a day   4. Chronic depression - continue sertraline 50 mg 1 tab daily   5. Mood disorder with psychosis ([place]) - continue valproic acid 250 mg/5 ML give 10 mL = 500 mg twice a day   6. Mixed hyperlipidemia - continue  pravastatin 20 mg 1 tab daily at bedtime Lab Results  Component Value Date   CHOL 212 (A) 04/08/2017   HDL 38 04/08/2017   LDLCALC 96 04/08/2017   TRIG 392 (A) 04/08/2017   CHOLHDL 2.2 05/23/2013       Goals of care:  Long-term care     Monina C. Cloverly - NP   [employer] [phone]

## 2017-08-14 DIAGNOSIS — E1140 Type 2 diabetes mellitus with diabetic neuropathy, unspecified: Secondary | ICD-10-CM | POA: Diagnosis not present

## 2017-08-14 DIAGNOSIS — B351 Tinea unguium: Secondary | ICD-10-CM | POA: Diagnosis not present

## 2017-08-14 DIAGNOSIS — Z794 Long term (current) use of insulin: Secondary | ICD-10-CM | POA: Diagnosis not present

## 2017-09-04 ENCOUNTER — Encounter: Payer: Self-pay | Admitting: Adult Health

## 2017-09-04 ENCOUNTER — Non-Acute Institutional Stay (SKILLED_NURSING_FACILITY): Payer: Medicare Other | Admitting: Adult Health

## 2017-09-04 DIAGNOSIS — E1122 Type 2 diabetes mellitus with diabetic chronic kidney disease: Secondary | ICD-10-CM | POA: Diagnosis not present

## 2017-09-04 DIAGNOSIS — I10 Essential (primary) hypertension: Secondary | ICD-10-CM

## 2017-09-04 DIAGNOSIS — E782 Mixed hyperlipidemia: Secondary | ICD-10-CM

## 2017-09-04 DIAGNOSIS — F32A Depression, unspecified: Secondary | ICD-10-CM

## 2017-09-04 DIAGNOSIS — F39 Unspecified mood [affective] disorder: Secondary | ICD-10-CM | POA: Diagnosis not present

## 2017-09-04 DIAGNOSIS — F329 Major depressive disorder, single episode, unspecified: Secondary | ICD-10-CM

## 2017-09-04 DIAGNOSIS — N183 Chronic kidney disease, stage 3 (moderate): Secondary | ICD-10-CM | POA: Diagnosis not present

## 2017-09-04 NOTE — Progress Notes (Signed)
Location:  [facility] Room Number: 110-B Place of Service:  SNF (31) Provider:  Durenda Age, NP  Patient Care Team: Hendricks Limes, MD as PCP - General (Internal Medicine)  Extended Emergency Contact Information Primary Emergency Contact: Collier Flowers, [place] Phone: [phone] Relation: Friend Secondary Emergency Contact: [place] [address] [place] Phone: [phone] Relation: Significant other  Code Status:  Full Code  Goals of care: Advanced Directive information Advanced Directives 04/21/2017  Does Patient Have a Medical Advance Directive? No  Type of Advance Directive -  Does patient want to make changes to medical advance directive? No - Patient declined  Copy of [place] in Chart? -  Would patient like information on creating a medical advance directive? -  Pre-existing out of facility DNR order (yellow form or pink MOST form) -  Some encounter information is confidential and restricted. Go to Review Flowsheets activity to see all data.     Chief Complaint  Patient presents with  . Medical Management of Chronic Issues    Routine [facility] visit    HPI:  Pt is a 71 y.o. male seen today for medical management of chronic diseases.  He is a long-term care resident of [facility].  He has a PMH of alcohol abuse, alcohol withdrawal seizure, cataract, anemia, stage III chronic renal insufficiency, depression, hearing impairment, HTN, HLD, and DM2. He was seen today in his room. He was sleeping but woke up upon verbal greeting.CBGs are - 179, 264, 118, 161, 195, 270, 181. He was noted to be chewing tobacco while sleeping.    Past Medical History:  Diagnosis Date  . Abnormality of gait 01/14/2016  . Alcohol abuse 2009   dependency as exhibited by ETOH withdrawal seizure.   . Alcohol withdrawal seizure  ([name]) 2013  . Anemia 2011   Normocytic.   . Cataract   . Chronic renal insufficiency, stage III (moderate) ([facility]) 02/26/2014  . Depression with anxiety   . Hearing impairment   . HTN (hypertension), benign 02/26/2014  . Hyperlipemia   . Hypertension   . Seizures ([place])   . Type II or unspecified type diabetes mellitus with neurological manifestations, not stated as uncontrolled(250.60) 02/26/2014   Past Surgical History:  Procedure Laterality Date  . NO PAST SURGERIES      No Known Allergies  Outpatient Encounter Medications as of 09/04/2017  Medication Sig  . acetaminophen (TYLENOL) 500 MG tablet 1,000 mg every 12 (twelve) hours. Take 2 tablets by mouth every morning and every evening for pain.   Marland Kitchen amLODipine (NORVASC) 5 MG tablet Take 5 mg by mouth daily.   Marland Kitchen aspirin (ASPIRIN EC) 81 MG EC tablet Take 81 mg by mouth daily. Swallow whole.  . folic acid (FOLVITE) 1 MG tablet Take 1 tablet (1 mg total) by mouth daily.  Marland Kitchen glipiZIDE (GLUCOTROL) 5 MG tablet Take 5 mg by mouth 2 (two) times daily with a meal.   . hydrocortisone cream 1 % Apply 1 application topically 2 (two) times daily as needed.   . insulin aspart (NOVOLOG) 100 UNIT/ML injection Inject 10 Units into the skin 3 (three) times daily before meals. Give 10 units SQ with meals. Call MD if CBG >400.  Marland Kitchen insulin glargine (LANTUS) 100 UNIT/ML injection Inject 52 Units into the skin at bedtime.   Marland Kitchen  ipratropium-albuterol (DUONEB) 0.5-2.5 (3) MG/3ML SOLN Take 3 mLs by nebulization every 6 (six) hours as needed.  Marland Kitchen lisinopril (PRINIVIL,ZESTRIL) 5 MG tablet Take 5 mg by mouth daily.  . pravastatin (PRAVACHOL) 20 MG tablet Take 20 mg at bedtime by mouth.   . Propylene Glycol (SYSTANE BALANCE OP) One drop in each eye daily for dry eye  . QUEtiapine (SEROQUEL) 100 MG tablet Take 100 mg by mouth 2 (two) times daily.   . sertraline (ZOLOFT) 50 MG tablet Take 50 mg by mouth daily.  Marland Kitchen thiamine (VITAMIN B-1) 100 MG tablet Take 100 mg by mouth  daily.  Marland Kitchen valproic acid (DEPAKENE) 250 MG/5ML syrup Take 10 mLs (500 mg total) by mouth 3 (three) times daily.   No facility-administered encounter medications on file as of 09/04/2017.     Review of Systems  GENERAL: No change in appetite, no fatigue, no weight changes, no fever, chills or weakness MOUTH and THROAT: Denies oral discomfort, gingival pain  RESPIRATORY: no cough, SOB, DOE, wheezing, hemoptysis CARDIAC: No chest pain, edema or palpitations GI: No abdominal pain, diarrhea, constipation, heart burn, nausea or vomiting PSYCHIATRIC: Denies feelings of depression or anxiety. No report of hallucinations, insomnia, paranoia, or agitation   Immunization History  Administered Date(s) Administered  . Influenza Split 06/07/2012  . Influenza,inj,Quad PF,6+ Mos 12/16/2013  . Influenza-Unspecified 06/27/2016, 07/12/2017  . PPD Test 01/02/2016  . Pneumococcal Polysaccharide-23 05/17/2009, 05/30/2012, 12/16/2013  . Td 05/17/2009  . Tdap 11/29/2011   Pertinent  Health Maintenance Due  Topic Date Due  . OPHTHALMOLOGY EXAM  07/10/2017  . FOOT EXAM  12/25/2017 (Originally 05/23/2014)  . PNA vac Low Risk Adult (2 of 2 - PCV13) 03/13/2018 (Originally 12/17/2014)  . COLONOSCOPY  06/29/2025 (Originally 10/18/1993)  . HEMOGLOBIN A1C  10/18/2017  . LIPID PANEL  04/08/2018  . INFLUENZA VACCINE  Completed   Fall Risk  04/21/2017 06/18/2016 06/11/2016 05/28/2016 05/23/2016  Falls in the past year? Yes No No No No  Number falls in past yr: 1 - - - -  Injury with Fall? No - - - -  Risk Factor Category  - - - - -  Risk for fall due to : - - - - -  Risk for fall due to: Comment - - - - -  Follow up - - - - -      Vitals:   09/04/17 1430  BP: 110/68  Pulse: 72  Resp: 20  Temp: 97.8 F (36.6 C)  TempSrc: Oral  SpO2: 98%  Weight: 251 lb 3.2 oz (113.9 kg)  Height: 5\' 8"  (1.727 m)   Body mass index is 38.19 kg/m.  Physical Exam  GENERAL APPEARANCE: Well nourished. In no acute distress.  Obese SKIN:  Skin is warm and dry.  MOUTH and THROAT: Lips are without lesions. Oral mucosa is moist and without lesions. RESPIRATORY: Breathing is even & unlabored, BS CTAB CARDIAC: RRR, no murmur,no extra heart sounds, no edema GI: Abdomen soft, normal BS, no masses, no tenderness EXTREMITIES:  Able to move X 4 extremities PSYCHIATRIC: Alert and oriented X 3. Affect and behavior are appropriate   Labs reviewed: Recent Labs    11/24/16 12/31/16 0910 05/13/17  NA 142 138 135*  K 5.0 5.0 5.5*  CL  --  97  --   CO2  --  22  --   GLUCOSE  --  190*  --   BUN 23* 22 24*  CREATININE 1.3 1.54* 1.2  CALCIUM  --  9.6  --    Recent Labs    11/24/16 12/31/16 0910 05/13/17  AST 8* 12 10*  ALT 7* 11 11  ALKPHOS 65 65 62  BILITOT  --  0.2  --   PROT  --  7.2  --   ALBUMIN  --  3.9  --    Recent Labs    12/31/16 0910 02/17/17 05/13/17  WBC 6.5 5.5 5.3  NEUTROABS 3.9  --  3  HGB 12.4* 13.4* 12.1*  HCT 39.1 41 37*  MCV 93  --   --   PLT 187 168 191   Lab Results  Component Value Date   TSH 3.661 05/10/2013   Lab Results  Component Value Date   HGBA1C 6.3 07/18/2017   Lab Results  Component Value Date   CHOL 212 (A) 04/08/2017   HDL 38 04/08/2017   LDLCALC 96 04/08/2017   TRIG 392 (A) 04/08/2017   CHOLHDL 2.2 05/23/2013    Assessment/Plan  1. Essential hypertension - well-controlled, continue amlodipine 5 mg 1 tab daily and lisinopril 5 mg 1 tab daily   2. Mixed hyperlipidemia - continue pravastatin 20 mg 1 tab daily at bedtime Lab Results  Component Value Date   CHOL 212 (A) 04/08/2017   HDL 38 04/08/2017   LDLCALC 96 04/08/2017   TRIG 392 (A) 04/08/2017   CHOLHDL 2.2 05/23/2013     3. 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 Chronic depression - continue sertraline 50 mg 1 tab daily   5. Type 2 diabetes mellitus with stage 3 chronic kidney disease, without long-term current use of insulin  (HCC) - continue Lantus 100 units/mL inject 52 units subcutaneous daily at bedtime, NovoLog 100 units/mL inject 10 units subcutaneous before meals for CBG >400 and glipizide 5 mg 1 tab twice a day Lab Results  Component Value Date   HGBA1C 6.3 07/18/2017      Family/ staff Communication: Discussed plan of care with resident and charge nurse  Labs/tests ordered:  None   Goals of care:   Long-term care   Durenda Age, NP [facility] and Adult Medicine [phone] (Monday-Friday 8:00 a.m. - 5:00 p.m.) [phone] (after hours)

## 2017-09-17 DIAGNOSIS — E785 Hyperlipidemia, unspecified: Secondary | ICD-10-CM | POA: Diagnosis not present

## 2017-09-17 DIAGNOSIS — E559 Vitamin D deficiency, unspecified: Secondary | ICD-10-CM | POA: Diagnosis not present

## 2017-09-17 DIAGNOSIS — D649 Anemia, unspecified: Secondary | ICD-10-CM | POA: Diagnosis not present

## 2017-09-17 DIAGNOSIS — N183 Chronic kidney disease, stage 3 (moderate): Secondary | ICD-10-CM | POA: Diagnosis not present

## 2017-09-17 DIAGNOSIS — I10 Essential (primary) hypertension: Secondary | ICD-10-CM | POA: Diagnosis not present

## 2017-09-17 DIAGNOSIS — E119 Type 2 diabetes mellitus without complications: Secondary | ICD-10-CM | POA: Diagnosis not present

## 2017-09-17 DIAGNOSIS — Z79899 Other long term (current) drug therapy: Secondary | ICD-10-CM | POA: Diagnosis not present

## 2017-09-17 LAB — BASIC METABOLIC PANEL
BUN: 26 — AB (ref 4–21)
CREATININE: 1.2 (ref 0.6–1.3)
Glucose: 202
POTASSIUM: 5.9 — AB (ref 3.4–5.3)
SODIUM: 137 (ref 137–147)

## 2017-09-17 LAB — LIPID PANEL
CHOLESTEROL: 186 (ref 0–200)
HDL: 32 — AB (ref 35–70)
LDL Cholesterol: 78
LDl/HDL Ratio: 5.8
Triglycerides: 382 — AB (ref 40–160)

## 2017-09-17 LAB — CBC AND DIFFERENTIAL
HCT: 39 — AB (ref 41–53)
Hemoglobin: 12.9 — AB (ref 13.5–17.5)
Neutrophils Absolute: 4
Platelets: 137 — AB (ref 150–399)
WBC: 5.8

## 2017-09-18 ENCOUNTER — Encounter: Payer: Self-pay | Admitting: Adult Health

## 2017-09-18 ENCOUNTER — Non-Acute Institutional Stay (SKILLED_NURSING_FACILITY): Payer: Medicare Other | Admitting: Adult Health

## 2017-09-18 DIAGNOSIS — E781 Pure hyperglyceridemia: Secondary | ICD-10-CM

## 2017-09-18 DIAGNOSIS — D649 Anemia, unspecified: Secondary | ICD-10-CM | POA: Diagnosis not present

## 2017-09-18 DIAGNOSIS — E875 Hyperkalemia: Secondary | ICD-10-CM

## 2017-09-18 DIAGNOSIS — Z79899 Other long term (current) drug therapy: Secondary | ICD-10-CM | POA: Diagnosis not present

## 2017-09-18 DIAGNOSIS — L219 Seborrheic dermatitis, unspecified: Secondary | ICD-10-CM

## 2017-09-18 LAB — BASIC METABOLIC PANEL
BUN: 24 — AB (ref 4–21)
CREATININE: 1.2 (ref 0.6–1.3)
GLUCOSE: 91
POTASSIUM: 5.7 — AB (ref 3.4–5.3)
Sodium: 138 (ref 137–147)

## 2017-09-18 NOTE — Progress Notes (Signed)
Location:  [facility] Room Number: 110-B Place of Service:  SNF (31) Provider:  Durenda Age, NP  Patient Care Team: Hendricks Limes, MD as PCP - General (Internal Medicine)  Extended Emergency Contact Information Primary Emergency Contact: Collier Flowers, [place] Phone: [phone] Relation: Friend Secondary Emergency Contact: [place] [address] [place] Phone: [phone] Relation: Significant other  Code Status:  Full Code  Goals of care: Advanced Directive information Advanced Directives 04/21/2017  Does Patient Have a Medical Advance Directive? No  Type of Advance Directive -  Does patient want to make changes to medical advance directive? No - Patient declined  Copy of [place] in Chart? -  Would patient like information on creating a medical advance directive? -  Pre-existing out of facility DNR order (yellow form or pink MOST form) -  Some encounter information is confidential and restricted. Go to Review Flowsheets activity to see all data.     Chief Complaint  Patient presents with  . Acute Visit    Labs demonstrated increased triglycerides and potassium    HPI:  Pt is a 71 y.o. male seen today for an acute visit secondary to elevated triglycerides and potassium on labs. He was seen in the room today chewing tobacco. K+ yesterday was 5.9, elevated. He was given Kayexalate X 1, Triglycerides 382, elevated. He is currently on Pravastatin 20 mg Q HS.  He is a long-term care resident of [facility].  He has a PMH of alcohol abuse, alcohol withdrawal seizure, caract, anemia, stage 3 chronic renal insufficiency, depression, hearing impairment, HTN, HLD, and DM2.      Past Medical History:  Diagnosis Date  . Abnormality of gait 01/14/2016  . Alcohol abuse 2009   dependency as exhibited by ETOH withdrawal  seizure.   . Alcohol withdrawal seizure ([place]) 2013  . Anemia 2011   Normocytic.   . Cataract   . Chronic renal insufficiency, stage III (moderate) ([place]) 02/26/2014  . Depression with anxiety   . Hearing impairment   . HTN (hypertension), benign 02/26/2014  . Hyperlipemia   . Hypertension   . Seizures ([place])   . Type II or unspecified type diabetes mellitus with neurological manifestations, not stated as uncontrolled(250.60) 02/26/2014   Past Surgical History:  Procedure Laterality Date  . NO PAST SURGERIES      No Known Allergies  Outpatient Encounter Medications as of 09/18/2017  Medication Sig  . acetaminophen (TYLENOL) 500 MG tablet 1,000 mg every 12 (twelve) hours. Take 2 tablets by mouth every morning and every evening for pain.   Marland Kitchen amLODipine (NORVASC) 5 MG tablet Take 5 mg by mouth daily.   Marland Kitchen aspirin (ASPIRIN EC) 81 MG EC tablet Take 81 mg by mouth daily. Swallow whole.  . folic acid (FOLVITE) 1 MG tablet Take 1 tablet (1 mg total) by mouth daily.  Marland Kitchen glipiZIDE (GLUCOTROL) 5 MG tablet Take 5 mg by mouth 2 (two) times daily with a meal.   . insulin aspart (NOVOLOG) 100 UNIT/ML injection Inject 10 Units into the skin 3 (three) times daily before meals. Give 10 units SQ with meals. Call MD if CBG >400.  Marland Kitchen insulin glargine (LANTUS) 100 UNIT/ML injection Inject 52 Units into the skin at bedtime.   Marland Kitchen ipratropium-albuterol (DUONEB) 0.5-2.5 (3) MG/3ML SOLN Take 3 mLs by nebulization  every 6 (six) hours as needed.  Marland Kitchen lisinopril (PRINIVIL,ZESTRIL) 5 MG tablet Take 5 mg by mouth daily.  . pravastatin (PRAVACHOL) 20 MG tablet Take 20 mg at bedtime by mouth.   . QUEtiapine (SEROQUEL) 100 MG tablet Take 100 mg by mouth 2 (two) times daily.   . sertraline (ZOLOFT) 50 MG tablet Take 50 mg by mouth daily.   Marland Kitchen thiamine (VITAMIN B-1) 100 MG tablet Take 100 mg by mouth daily.   Marland Kitchen valproic acid (DEPAKENE) 250 MG/5ML syrup Take 10 mLs (500 mg total) by mouth 3 (three) times daily.  . [DISCONTINUED]  hydrocortisone cream 1 % Apply 1 application topically 2 (two) times daily as needed.   . [DISCONTINUED] Propylene Glycol (SYSTANE BALANCE OP) One drop in each eye daily for dry eye   No facility-administered encounter medications on file as of 09/18/2017.     Review of Systems  GENERAL: No change in appetite, no fatigue, no weight changes, no fever, chills or weakness MOUTH and THROAT: Denies oral discomfort, gingival pain or bleeding RESPIRATORY: no cough, SOB, DOE, wheezing, hemoptysis CARDIAC: No chest pain, edema or palpitations GI: No abdominal pain, diarrhea, constipation, heart burn, nausea or vomiting GU: Denies dysuria, frequency, hematuria, or discharge PSYCHIATRIC: Denies feelings of depression or anxiety. No report of hallucinations, insomnia, paranoia, or agitation   Immunization History  Administered Date(s) Administered  . Influenza Split 06/07/2012  . Influenza,inj,Quad PF,6+ Mos 12/16/2013  . Influenza-Unspecified 06/27/2016, 07/12/2017  . PPD Test 01/02/2016  . Pneumococcal Polysaccharide-23 05/17/2009, 05/30/2012, 12/16/2013  . Td 05/17/2009  . Tdap 11/29/2011   Pertinent  Health Maintenance Due  Topic Date Due  . FOOT EXAM  12/25/2017 (Originally 05/23/2014)  . PNA vac Low Risk Adult (2 of 2 - PCV13) 03/13/2018 (Originally 12/17/2014)  . OPHTHALMOLOGY EXAM  09/18/2018 (Originally 07/10/2017)  . COLONOSCOPY  06/29/2025 (Originally 10/18/1993)  . HEMOGLOBIN A1C  10/18/2017  . LIPID PANEL  04/08/2018  . INFLUENZA VACCINE  Completed   Fall Risk  04/21/2017 06/18/2016 06/11/2016 05/28/2016 05/23/2016  Falls in the past year? Yes No No No No  Number falls in past yr: 1 - - - -  Injury with Fall? No - - - -  Risk Factor Category  - - - - -  Risk for fall due to : - - - - -  Risk for fall due to: Comment - - - - -  Follow up - - - - -      Vitals:   09/18/17 1004  BP: 140/83  Pulse: 70  Resp: 20  Temp: 98.7 F (37.1 C)  TempSrc: Oral  SpO2: 98%  Weight:  251 lb 3.2 oz (113.9 kg)  Height: _0  (1.727 m)   Body mass index is 38.19 kg/m.  Physical Exam  GENERAL APPEARANCE: Well nourished. In no acute distress. Normal body habitus SKIN: rashes on face with flakes MOUTH and THROAT: Lips are without lesions. Oral mucosa is moist and without lesions. RESPIRATORY: Breathing is even & unlabored, BS CTAB CARDIAC: RRR, no murmur,no extra heart sounds, no edema GI: Abdomen soft, normal BS, no masses, no tenderness EXTREMITIES:  Able to move X 4 extremities PSYCHIATRIC: Alert to self and place, disoriented to time. Affect and behavior are appropriate   Labs reviewed: 09/17/17  WBC 5.8 hemoglobin 12.9 hematocrit 39.4 MCV 94 platelets 137 glucose 202 calcium 9.4 creatinine 1.18 BUN 25.8 sodium 137 potassium 5.9 eGFR 60.46 cholesterol 186 triglycerides 382 HDL 32 LDL 78 magnesium 1.7 phos 3.4  Recent Labs    11/24/16 12/31/16 0910 05/13/17  NA 142 138 135*  K 5.0 5.0 5.5*  CL  --  97  --   CO2  --  22  --   GLUCOSE  --  190*  --   BUN 23* 22 24*  CREATININE 1.3 1.54* 1.2  CALCIUM  --  9.6  --    Recent Labs    11/24/16 12/31/16 0910 05/13/17  AST 8* 12 10*  ALT 7* 11 11  ALKPHOS 65 65 62  BILITOT  --  0.2  --   PROT  --  7.2  --   ALBUMIN  --  3.9  --    Recent Labs    12/31/16 0910 02/17/17 05/13/17  WBC 6.5 5.5 5.3  NEUTROABS 3.9  --  3  HGB 12.4* 13.4* 12.1*  HCT 39.1 41 37*  MCV 93  --   --   PLT 187 168 191   Lab Results  Component Value Date   TSH 3.661 05/10/2013   Lab Results  Component Value Date   HGBA1C 6.3 07/18/2017   Lab Results  Component Value Date   CHOL 212 (A) 04/08/2017   HDL 38 04/08/2017   LDLCALC 96 04/08/2017   TRIG 392 (A) 04/08/2017   CHOLHDL 2.2 05/23/2013    Assessment/Plan  1. Hypertriglyceridemia - increase Pravastatin from 20 mg to 40 mg Q HS   2. Hyperkalemia - K 5.9, Kayexalate 15 gm/60 ml given, awaiting result o repeat BMP   3. Seborrheic dermatitis - start Ketoconazole 2%  cream apply topically to rashes on face BID X 4 weeks     Durenda Age, NP [facility] and Adult Medicine [phone] (Monday-Friday 8:00 a.m. - 5:00 p.m.) [phone] (after hours)

## 2017-09-23 ENCOUNTER — Encounter: Payer: Self-pay | Admitting: Adult Health

## 2017-09-23 ENCOUNTER — Non-Acute Institutional Stay (SKILLED_NURSING_FACILITY): Payer: Medicare Other | Admitting: Adult Health

## 2017-09-23 DIAGNOSIS — E875 Hyperkalemia: Secondary | ICD-10-CM | POA: Diagnosis not present

## 2017-09-23 NOTE — Progress Notes (Signed)
Location:  [facility] Room Number: 110-B Place of Service:  SNF (31) Provider:  Durenda Age, NP  Patient Care Team: Hendricks Limes, MD as PCP - General (Internal Medicine)  Extended Emergency Contact Information Primary Emergency Contact: Collier Flowers, [place] Phone: [phone] Relation: Friend Secondary Emergency Contact: [place] [address] [place] Phone: [phone] Relation: Significant other  Code Status:  Full Code  Goals of care: Advanced Directive information Advanced Directives 04/21/2017  Does Patient Have a Medical Advance Directive? No  Type of Advance Directive -  Does patient want to make changes to medical advance directive? No - Patient declined  Copy of [place] in Chart? -  Would patient like information on creating a medical advance directive? -  Pre-existing out of facility DNR order (yellow form or pink MOST form) -  Some encounter information is confidential and restricted. Go to Review Flowsheets activity to see all data.     Chief Complaint  Patient presents with  . Acute Visit    Hyperkalemia demonstrated on labs    HPI:  Pt is a 71 y.o. male seen today for acute visit.  Latest K 5.7. He denies muscle spasm. He was seen lying down in bed while chewing tobacco. He is a long-term care resident of [facility].  He has a PMH of alcohol abuse, alcohol withdrawal seizure, cataract, anemia, stage 3 chronic renal insufficiency, depression, hearing impairment, HTN, HLD, and DM2.    Past Medical History:  Diagnosis Date  . Abnormality of gait 01/14/2016  . Alcohol abuse 2009   dependency as exhibited by ETOH withdrawal seizure.   . Alcohol withdrawal seizure ([place]) 2013  . Anemia 2011   Normocytic.   . Cataract   . Chronic renal insufficiency, stage III (moderate) ([place]) 02/26/2014  .  Depression with anxiety   . Hearing impairment   . HTN (hypertension), benign 02/26/2014  . Hyperlipemia   . Hypertension   . Seizures ([place])   . Type II or unspecified type diabetes mellitus with neurological manifestations, not stated as uncontrolled(250.60) 02/26/2014   Past Surgical History:  Procedure Laterality Date  . NO PAST SURGERIES      No Known Allergies  Outpatient Encounter Medications as of 09/23/2017  Medication Sig  . acetaminophen (TYLENOL) 500 MG tablet 1,000 mg every 12 (twelve) hours. Take 2 tablets by mouth every morning and every evening for pain.   Marland Kitchen amLODipine (NORVASC) 5 MG tablet Take 5 mg by mouth daily.   Marland Kitchen aspirin (ASPIRIN EC) 81 MG EC tablet Take 81 mg by mouth daily. Swallow whole.  . folic acid (FOLVITE) 1 MG tablet Take 1 tablet (1 mg total) by mouth daily.  Marland Kitchen glipiZIDE (GLUCOTROL) 5 MG tablet Take 5 mg by mouth 2 (two) times daily with a meal.   . insulin aspart (NOVOLOG) 100 UNIT/ML injection Inject 10 Units into the skin 3 (three) times daily before meals. Give 10 units SQ with meals. Call MD if CBG >400.  Marland Kitchen insulin glargine (LANTUS) 100 UNIT/ML injection Inject 52 Units into the skin at bedtime.   Marland Kitchen ipratropium-albuterol (DUONEB) 0.5-2.5 (3) MG/3ML SOLN Take 3 mLs by nebulization every 6 (six) hours as needed.  Marland Kitchen ketoconazole (NIZORAL) 2 % cream Apply 1 application topically 2 (two) times daily.  Marland Kitchen lisinopril (PRINIVIL,ZESTRIL) 5 MG tablet  Take 5 mg by mouth daily.  . pravastatin (PRAVACHOL) 40 MG tablet Take 40 mg by mouth at bedtime.  Marland Kitchen QUEtiapine (SEROQUEL) 100 MG tablet Take 100 mg by mouth 2 (two) times daily.   . sertraline (ZOLOFT) 50 MG tablet Take 50 mg by mouth daily.   Marland Kitchen thiamine (VITAMIN B-1) 100 MG tablet Take 100 mg by mouth daily.   Marland Kitchen valproic acid (DEPAKENE) 250 MG/5ML syrup Take 10 mLs (500 mg total) by mouth 3 (three) times daily.  . [DISCONTINUED] pravastatin (PRAVACHOL) 20 MG tablet Take 20 mg at bedtime by mouth.    No  facility-administered encounter medications on file as of 09/23/2017.     Review of Systems  GENERAL: No change in appetite, no fatigue, no weight changes, no fever, chills or weakness MOUTH and THROAT: Denies oral discomfort, gingival pain or bleeding RESPIRATORY: no cough, SOB, DOE, wheezing, hemoptysis CARDIAC: No chest pain, edema or palpitations GI: No abdominal pain, diarrhea, constipation, heart burn, nausea or vomiting PSYCHIATRIC: Denies feelings of depression or anxiety. No report of hallucinations, insomnia, paranoia, or agitation   Immunization History  Administered Date(s) Administered  . Influenza Split 06/07/2012  . Influenza,inj,Quad PF,6+ Mos 12/16/2013  . Influenza-Unspecified 06/27/2016, 07/12/2017  . PPD Test 01/02/2016  . Pneumococcal Polysaccharide-23 05/17/2009, 05/30/2012, 12/16/2013  . Td 05/17/2009  . Tdap 11/29/2011   Pertinent  Health Maintenance Due  Topic Date Due  . FOOT EXAM  12/25/2017 (Originally 05/23/2014)  . PNA vac Low Risk Adult (2 of 2 - PCV13) 03/13/2018 (Originally 12/17/2014)  . OPHTHALMOLOGY EXAM  09/18/2018 (Originally 07/10/2017)  . COLONOSCOPY  06/29/2025 (Originally 10/18/1993)  . HEMOGLOBIN A1C  10/18/2017  . LIPID PANEL  09/17/2018  . INFLUENZA VACCINE  Completed   Fall Risk  04/21/2017 06/18/2016 06/11/2016 05/28/2016 05/23/2016  Falls in the past year? Yes No No No No  Number falls in past yr: 1 - - - -  Injury with Fall? No - - - -  Risk Factor Category  - - - - -  Risk for fall due to : - - - - -  Risk for fall due to: Comment - - - - -  Follow up - - - - -      Vitals:   09/23/17 1406  BP: 140/83  Pulse: 70  Resp: 20  Temp: 98.7 F (37.1 C)  TempSrc: Oral  SpO2: 98%  Weight: 251 lb 3.2 oz (113.9 kg)  Height: '5\' 8"'$  (1.727 m)   Body mass index is 38.19 kg/m.  Physical Exam  GENERAL APPEARANCE: Well nourished. In no acute distress. Obese SKIN:  Skin is warm and dry.  MOUTH and THROAT: Lips are without lesions.  Oral mucosa is moist and without lesions. RESPIRATORY: Breathing is even & unlabored, BS CTAB CARDIAC: RRR, no murmur,no extra heart sounds, no edema GI: Abdomen soft, normal BS, no masses, no tenderness EXTREMITIES:  Able to move x 4 extremities PSYCHIATRIC: Alert and oriented X 3. Affect and behavior are appropriate   Labs reviewed: 09/18/17  glucose 91 calcium 9.7 creatinine 1.16 BUN 24.2 sodium 138 K5.7  eGFR 61.73 Recent Labs    12/31/16 0910 05/13/17 09/17/17  NA 138 135* 137  K 5.0 5.5* 5.9*  CL 97  --   --   CO2 22  --   --   GLUCOSE 190*  --   --   BUN 22 24* 26*  CREATININE 1.54* 1.2 1.2  CALCIUM 9.6  --   --  Recent Labs    11/24/16 12/31/16 0910 05/13/17  AST 8* 12 10*  ALT 7* 11 11  ALKPHOS 65 65 62  BILITOT  --  0.2  --   PROT  --  7.2  --   ALBUMIN  --  3.9  --    Recent Labs    12/31/16 0910 02/17/17 05/13/17 09/17/17  WBC 6.5 5.5 5.3 5.8  NEUTROABS 3.9  --  3 4  HGB 12.4* 13.4* 12.1* 12.9*  HCT 39.1 41 37* 39*  MCV 93  --   --   --   PLT 187 168 191 137*   Lab Results  Component Value Date   TSH 3.661 05/10/2013   Lab Results  Component Value Date   HGBA1C 6.3 07/18/2017   Lab Results  Component Value Date   CHOL 186 09/17/2017   HDL 32 (A) 09/17/2017   LDLCALC 78 09/17/2017   TRIG 382 (A) 09/17/2017   CHOLHDL 2.2 05/23/2013    Assessment/Plan  1. Hyperkalemia - K 5.7, give Kayexalate 15 g/60 ml by mouth 1, BNP on 09/24/17     Durenda Age, NP [facility] and Adult Medicine [phone] (Monday-Friday 8:00 a.m. - 5:00 p.m.) [phone] (after hours)

## 2017-09-25 DIAGNOSIS — N183 Chronic kidney disease, stage 3 (moderate): Secondary | ICD-10-CM | POA: Diagnosis not present

## 2017-09-25 DIAGNOSIS — Z79899 Other long term (current) drug therapy: Secondary | ICD-10-CM | POA: Diagnosis not present

## 2017-09-25 DIAGNOSIS — I10 Essential (primary) hypertension: Secondary | ICD-10-CM | POA: Diagnosis not present

## 2017-09-25 DIAGNOSIS — D649 Anemia, unspecified: Secondary | ICD-10-CM | POA: Diagnosis not present

## 2017-09-25 LAB — BASIC METABOLIC PANEL
BUN: 31 — AB (ref 4–21)
Creatinine: 1.3 (ref 0.6–1.3)
Glucose: 223
Potassium: 5.5 — AB (ref 3.4–5.3)
Sodium: 137 (ref 137–147)

## 2017-10-05 ENCOUNTER — Encounter: Payer: Self-pay | Admitting: Internal Medicine

## 2017-10-05 NOTE — Progress Notes (Signed)
    NURSING HOME LOCATION:  [facility] ROOM NUMBER:  110-B  CODE STATUS:  Full Code  PCP:  Hendricks Limes, MD [place] [id number]  This is a nursing facility follow up for specific acute issue of chronic medical diagnoses   Interim medical record and care since last [place] visit was updated with review of diagnostic studies and change in clinical status since last visit were documented.  HPI:  Review of systems: Dementia invalidated responses. Date given as   Constitutional: No fever,significant weight change, fatigue  Eyes: No redness, discharge, pain, vision change ENT/mouth: No nasal congestion,  purulent discharge, earache,change in hearing ,sore throat  Cardiovascular: No chest pain, palpitations,paroxysmal nocturnal dyspnea, claudication, edema  Respiratory: No cough, sputum production,hemoptysis, DOE , significant snoring,apnea  Gastrointestinal: No heartburn,dysphagia,abdominal pain, nausea / vomiting,rectal bleeding, melena,change in bowels Genitourinary: No dysuria,hematuria, pyuria,  incontinence, nocturia Musculoskeletal: No joint stiffness, joint swelling, weakness,pain Dermatologic: No rash, pruritus, change in appearance of skin Neurologic: No dizziness,headache,syncope, seizures, numbness , tingling Psychiatric: No significant anxiety , depression, insomnia, anorexia Endocrine: No change in hair/skin/ nails, excessive thirst, excessive hunger, excessive urination  Hematologic/lymphatic: No significant bruising, lymphadenopathy,abnormal bleeding Allergy/immunology: No itchy/ watery eyes, significant sneezing, urticaria, angioedema  Physical exam:  Pertinent or positive findings: General appearance:Adequately nourished; no acute distress , increased work of breathing is present.   Lymphatic: No lymphadenopathy about the head, neck, axilla . Eyes: No conjunctival inflammation or lid edema is present. There is no scleral  icterus. Ears:  External ear exam shows no significant lesions or deformities.   Nose:  External nasal examination shows no deformity or inflammation. Nasal mucosa are pink and moist without lesions ,exudates Oral exam: lips and gums are healthy appearing.There is no oropharyngeal erythema or exudate . Neck:  No thyromegaly, masses, tenderness noted.    Heart:  Normal rate and regular rhythm. S1 and S2 normal without gallop, murmur, click, rub .  Lungs:Chest clear to auscultation without wheezes, rhonchi,rales , rubs. Abdomen:Bowel sounds are normal. Abdomen is soft and nontender with no organomegaly, hernias,masses. GU: deferred  Extremities:  No cyanosis, clubbing,edema  Neurologic exam : Cn 2-7 intact Strength equal  in upper & lower extremities Balance,Rhomberg,finger to nose testing could not be completed due to clinical state Deep tendon reflexes are equal Skin: Warm & dry w/o tenting. No significant lesions or rash.  See summary under each active problem in the Problem List with associated updated therapeutic plan     This encounter was created in error - please disregard.

## 2017-10-15 ENCOUNTER — Encounter: Payer: Self-pay | Admitting: Internal Medicine

## 2017-10-15 NOTE — Progress Notes (Signed)
    NURSING HOME LOCATION:  [facility] ROOM NUMBER:  110-B  CODE STATUS:  Full Code  PCP:  Hendricks Limes, MD  [address] [place] [address]  This is a nursing facility follow up for specific acute issue of chronic medical diagnoses.  Interim medical record and care since last [place] visit was updated with review of diagnostic studies and change in clinical status since last visit were documented.  HPI:  Review of systems: Dementia invalidated responses. Date given as   Constitutional: No fever,significant weight change, fatigue  Eyes: No redness, discharge, pain, vision change ENT/mouth: No nasal congestion,  purulent discharge, earache,change in hearing ,sore throat  Cardiovascular: No chest pain, palpitations,paroxysmal nocturnal dyspnea, claudication, edema  Respiratory: No cough, sputum production,hemoptysis, DOE , significant snoring,apnea  Gastrointestinal: No heartburn,dysphagia,abdominal pain, nausea / vomiting,rectal bleeding, melena,change in bowels Genitourinary: No dysuria,hematuria, pyuria,  incontinence, nocturia Musculoskeletal: No joint stiffness, joint swelling, weakness,pain Dermatologic: No rash, pruritus, change in appearance of skin Neurologic: No dizziness,headache,syncope, seizures, numbness , tingling Psychiatric: No significant anxiety , depression, insomnia, anorexia Endocrine: No change in hair/skin/ nails, excessive thirst, excessive hunger, excessive urination  Hematologic/lymphatic: No significant bruising, lymphadenopathy,abnormal bleeding Allergy/immunology: No itchy/ watery eyes, significant sneezing, urticaria, angioedema  Physical exam:  Pertinent or positive findings: General appearance:Adequately nourished; no acute distress , increased work of breathing is present.   Lymphatic: No lymphadenopathy about the head, neck, axilla . Eyes: No conjunctival inflammation or lid edema is present. There is no scleral  icterus. Ears:  External ear exam shows no significant lesions or deformities.   Nose:  External nasal examination shows no deformity or inflammation. Nasal mucosa are pink and moist without lesions ,exudates Oral exam: lips and gums are healthy appearing.There is no oropharyngeal erythema or exudate . Neck:  No thyromegaly, masses, tenderness noted.    Heart:  Normal rate and regular rhythm. S1 and S2 normal without gallop, murmur, click, rub .  Lungs:Chest clear to auscultation without wheezes, rhonchi,rales , rubs. Abdomen:Bowel sounds are normal. Abdomen is soft and nontender with no organomegaly, hernias,masses. GU: deferred  Extremities:  No cyanosis, clubbing,edema  Neurologic exam : Cn 2-7 intact Strength equal  in upper & lower extremities Balance,Rhomberg,finger to nose testing could not be completed due to clinical state Deep tendon reflexes are equal Skin: Warm & dry w/o tenting. No significant lesions or rash.  See summary under each active problem in the Problem List with associated updated therapeutic plan     This encounter was created in error - please disregard.

## 2017-10-20 ENCOUNTER — Encounter: Payer: Self-pay | Admitting: Internal Medicine

## 2017-10-20 ENCOUNTER — Non-Acute Institutional Stay (SKILLED_NURSING_FACILITY): Payer: Medicare Other | Admitting: Internal Medicine

## 2017-10-20 DIAGNOSIS — R1013 Epigastric pain: Secondary | ICD-10-CM

## 2017-10-20 DIAGNOSIS — I10 Essential (primary) hypertension: Secondary | ICD-10-CM

## 2017-10-20 DIAGNOSIS — E0829 Diabetes mellitus due to underlying condition with other diabetic kidney complication: Secondary | ICD-10-CM | POA: Diagnosis not present

## 2017-10-20 DIAGNOSIS — E0865 Diabetes mellitus due to underlying condition with hyperglycemia: Secondary | ICD-10-CM

## 2017-10-20 DIAGNOSIS — N183 Chronic kidney disease, stage 3 unspecified: Secondary | ICD-10-CM

## 2017-10-20 DIAGNOSIS — IMO0002 Reserved for concepts with insufficient information to code with codable children: Secondary | ICD-10-CM

## 2017-10-20 DIAGNOSIS — Z9111 Patient's noncompliance with dietary regimen: Secondary | ICD-10-CM | POA: Diagnosis not present

## 2017-10-20 DIAGNOSIS — Z91119 Patient's noncompliance with dietary regimen due to unspecified reason: Secondary | ICD-10-CM | POA: Insufficient documentation

## 2017-10-20 NOTE — Assessment & Plan Note (Signed)
BP controlled; no change in antihypertensive medications  

## 2017-10-20 NOTE — Assessment & Plan Note (Addendum)
Renal function is stable with a creatinine 1.3  A1c will be updated, but  the chance of diabetic control is slim and nil with his incredible noncompliance with any dietary interventions Glipizide is relatively contraindicated with insulin

## 2017-10-20 NOTE — Assessment & Plan Note (Addendum)
Ranitidine 150 mg twice a day for 2 weeks The triggers for reflux  include stress; the "aspirin family" ; alcohol; peppermint; and caffeine (coffee, tea, cola, and chocolate). The aspirin family would include aspirin and the nonsteroidal agents such as ibuprofen &  Naproxen. Tylenol would not cause reflux. If having symptoms ; food & drink should be avoided for @ least 2 hours before going to bed.  He has no interest in decreasing tobacco use or caffeine intake

## 2017-10-20 NOTE — Progress Notes (Signed)
NURSING HOME LOCATION:  [facility] ROOM NUMBER:  110-B  CODE STATUS:  Full Code  PCP:  Hendricks Limes, MD  [place] [address]  This is a nursing facility follow up of chronic medical diagnoses  Interim medical record and care since last [place] visit was updated with review of diagnostic studies and change in clinical status since last visit were documented.  HPI: The patient is a permanent resident of his facility. Medical diagnoses include seizures, hypertension, dyslipidemia, anxiety/depression, CKD stage III, and history of alcohol abuse. The patient has poorly controlled diabetes with markedly variable glucoses. This is in the context of noncompliance/nonadherence to diet. Lowest reported glucose is 125 at lunch time. The highest reported glucose is 324 with evening meal. Fasting glucoses range from a low of 135 up to 307. Labs are current, he has creatinine of 1.3. His last A1c was 6.3% on 07/18/17. His last lipid profile was 09/17/17. LDL was essentially at goal at 78. Triglycerides were elevated to  382 as expected. Apparently he quit drinking in 2016. He is a former smoker.  Review of systems: He initially said the year was 1990 but quickly corrected himself. He knew the date and the month. His responses are slow but he was oriented. He describes heartburn. I listed the triggers for dyspepsia which include the aspirin family, alcohol, peppermint, tobacco, and caffeine. His reply was "I've been chewing tobacco since I was 72 years old".  Constitutional: No fever,significant weight change, fatigue  Eyes: No redness, discharge, pain, vision change ENT/mouth: No nasal congestion,  purulent discharge, earache,change in hearing ,sore throat  Cardiovascular: No chest pain, palpitations,paroxysmal nocturnal dyspnea, claudication, edema  Respiratory: No cough, sputum production,hemoptysis, DOE , significant snoring,apnea  Gastrointestinal: No  dysphagia,abdominal pain, nausea / vomiting,rectal bleeding, melena,change in bowels Genitourinary: No dysuria,hematuria, pyuria,  incontinence, nocturia Musculoskeletal: No joint stiffness, joint swelling, weakness,pain Dermatologic: No rash, pruritus, change in appearance of skin Neurologic: No dizziness,headache,syncope, seizures, numbness , tingling Psychiatric: No significant anxiety , depression, insomnia, anorexia Endocrine: No change in hair/skin/ nails, excessive thirst, excessive hunger, excessive urination  Hematologic/lymphatic: No significant bruising, lymphadenopathy,abnormal bleeding Allergy/immunology: No itchy/ watery eyes, significant sneezing, urticaria, angioedema  Physical exam:  Pertinent or positive findings: He sits in a wheelchair. He is disheveled He is obese.Pattern alopecia is present. He has a mustache and beard. He is hard of hearing. Ptosis is present on the right.  He is edentulous. S4 suggested.Low grade rhonchi present. He has marked gynecomastia. Abdomen is massively protuberant. There is one half plus edema at the sock line. Pedal pulses are decreased. Fourth left finger DIP amputation, flexion contracture fifth right DIP. There are at least 4 containers of melted ice cream,cookies, hard candy and liters of [employer] and Pepsi at [employer]. There was a lemon-sized ball of chewing tobacco on the bedside table which he had removed after it had been chewed.   General appearance: no acute distress , increased work of breathing is present.   Lymphatic: No lymphadenopathy about the head, neck, axilla . Eyes: No conjunctival inflammation or lid edema is present. There is no scleral icterus. Ears:  External ear exam shows no significant lesions or deformities.   Nose:  External nasal examination shows no deformity or inflammation. Nasal mucosa are pink and moist without lesions ,exudates Oral exam: lips and gums are healthy appearing.There is no oropharyngeal  erythema or exudate . Neck:  No thyromegaly, masses, tenderness noted.  Heart:  Normal rate and regular rhythm without gallop, murmur, click, rub .  Lungs: without wheezes, rales , rubs. Abdomen:Bowel sounds are normal; no organomegaly, hernias,masses. GU: deferred  Extremities:  No cyanosis, clubbing Neurologic exam : Balance,Rhomberg,finger to nose testing could not be completed due to clinical state Skin: Warm & dry w/o tenting. No significant lesions or rash.  See summary under each active problem in the Problem List with associated updated therapeutic plan

## 2017-10-20 NOTE — Patient Instructions (Signed)
See assessment and plan under each diagnosis in the problem list and acutely for this visit 

## 2017-10-20 NOTE — Assessment & Plan Note (Signed)
Creatinine stable at 1.3

## 2017-10-21 DIAGNOSIS — N183 Chronic kidney disease, stage 3 (moderate): Secondary | ICD-10-CM | POA: Diagnosis not present

## 2017-10-21 DIAGNOSIS — E119 Type 2 diabetes mellitus without complications: Secondary | ICD-10-CM | POA: Diagnosis not present

## 2017-10-21 DIAGNOSIS — E785 Hyperlipidemia, unspecified: Secondary | ICD-10-CM | POA: Diagnosis not present

## 2017-10-21 LAB — HEMOGLOBIN A1C: HEMOGLOBIN A1C: 7.2

## 2017-11-12 ENCOUNTER — Non-Acute Institutional Stay (SKILLED_NURSING_FACILITY): Payer: Medicare Other | Admitting: Adult Health

## 2017-11-12 ENCOUNTER — Encounter: Payer: Self-pay | Admitting: Adult Health

## 2017-11-12 DIAGNOSIS — I10 Essential (primary) hypertension: Secondary | ICD-10-CM

## 2017-11-12 DIAGNOSIS — N183 Chronic kidney disease, stage 3 unspecified: Secondary | ICD-10-CM

## 2017-11-12 DIAGNOSIS — E875 Hyperkalemia: Secondary | ICD-10-CM | POA: Diagnosis not present

## 2017-11-12 DIAGNOSIS — IMO0002 Reserved for concepts with insufficient information to code with codable children: Secondary | ICD-10-CM

## 2017-11-12 DIAGNOSIS — E0865 Diabetes mellitus due to underlying condition with hyperglycemia: Secondary | ICD-10-CM | POA: Diagnosis not present

## 2017-11-12 DIAGNOSIS — E0829 Diabetes mellitus due to underlying condition with other diabetic kidney complication: Secondary | ICD-10-CM | POA: Diagnosis not present

## 2017-11-12 DIAGNOSIS — F333 Major depressive disorder, recurrent, severe with psychotic symptoms: Secondary | ICD-10-CM | POA: Diagnosis not present

## 2017-11-12 DIAGNOSIS — F39 Unspecified mood [affective] disorder: Secondary | ICD-10-CM

## 2017-11-12 NOTE — Progress Notes (Signed)
Location:  [facility] Room Number: 110-B Place of Service:  SNF (31) Provider:  Durenda Age, NP  Patient Care Team: Hendricks Limes, MD as PCP - General (Internal Medicine)  Extended Emergency Contact Information Primary Emergency Contact: Collier Flowers, [place] Phone: [phone] Relation: Friend Secondary Emergency Contact: [place] [address] [place] Phone: [phone] Relation: Significant other  Code Status:  Full Code  Goals of care: Advanced Directive information Advanced Directives 04/21/2017  Does Patient Have a Medical Advance Directive? No  Type of Advance Directive -  Does patient want to make changes to medical advance directive? No - Patient declined  Copy of [place] in Chart? -  Would patient like information on creating a medical advance directive? -  Pre-existing out of facility DNR order (yellow form or pink MOST form) -  Some encounter information is confidential and restricted. Go to Review Flowsheets activity to see all data.     Chief Complaint  Patient presents with  . Medical Management of Chronic Issues    Routine [facility] visit    HPI:  Pt is a 72 y.o. male seen today for medical management of chronic diseases.  He is a long-term care resident of [facility].  He has a PMH of alcohol abuse, alcohol withdrawal seizure, cataract, anemia, stage 3 chronic renal insufficiency, depression, hearing impairment, HTN, HLD, and DM2. He was seen sleeping in the room. He has just eaten breakfast. He woke up upon verbal greetings.  He was noted to have tobacco in his mouth. He verbalized being incontinent of urine. Last BMP done showed K 5.5, elevated. CBGs were noted to be elevated - 184, 231, 203, 268, 198, 292, 250, 245.    Past Medical History:  Diagnosis Date  . Abnormality of gait  01/14/2016  . Alcohol abuse 2009   dependency as exhibited by ETOH withdrawal seizure.   . Alcohol withdrawal seizure ([place]) 2013  . Anemia 2011   Normocytic.   . Cataract   . Chronic renal insufficiency, stage III (moderate) ([place]) 02/26/2014  . Depression with anxiety   . Hearing impairment   . HTN (hypertension), benign 02/26/2014  . Hyperlipemia   . Seizures ([place])   . Type II or unspecified type diabetes mellitus with neurological manifestations, not stated as uncontrolled(250.60) 02/26/2014   Past Surgical History:  Procedure Laterality Date  . NO PAST SURGERIES      No Known Allergies  Outpatient Encounter Medications as of 11/12/2017  Medication Sig  . acetaminophen (TYLENOL) 500 MG tablet 1,000 mg every 12 (twelve) hours. Take 2 tablets by mouth every morning and every evening for pain.   [place] Kitchen amLODipine (NORVASC) 5 MG tablet Take 5 mg by mouth daily.   Mar[place] Kitchen aspirin (ASPIRIN EC) 81 MG EC tablet Take 81 mg by mouth daily. Swallow whole.  . folic acid (FOLVITE) 1 MG tablet Take 1 tablet (1 mg total) by mouth daily.  . insulin aspart (NOVOLOG) 100 UNIT/ML injection Inject 10 Units into the skin 3 (three) times daily before meals. Give 10 units SQ with meals. Call MD if CBG >400.  Marland Kitchen insulin glargine (LANTUS) 100 UNIT/ML injection Inject 52 Units into the skin at bedtime.   Mar[place] Kitchen ipratropium-albuterol (DUONEB) 0.5-2.5 (3) MG/3ML SOLN Take 3 mLs by nebulization every 6 (six) hours as needed.  Marland Kitchen  lisinopril (PRINIVIL,ZESTRIL) 5 MG tablet Take 5 mg by mouth daily.  . QUEtiapine (SEROQUEL) 100 MG tablet Take 100 mg by mouth 2 (two) times daily.   . sertraline (ZOLOFT) 50 MG tablet Take 50 mg by mouth daily.   Mar[place] Kitchen thiamine (VITAMIN B-1) 100 MG tablet Take 100 mg by mouth daily.   Marland Kitchen valproic acid (DEPAKENE) 250 MG/5ML syrup Take 10 mLs (500 mg total) by mouth 3 (three) times daily.  . [DISCONTINUED] glipiZIDE (GLUCOTROL) 5 MG tablet Take 5 mg by mouth 2 (two) times daily with a meal.    No  facility-administered encounter medications on file as of 11/12/2017.     Review of Systems  GENERAL: No change in appetite, no fatigue, no weight changes, no fever, chills or weakness MOUTH and THROAT: Denies oral discomfort, gingival pain or bleeding, pain from teeth or hoarseness   RESPIRATORY: no cough, SOB, DOE, wheezing, hemoptysis CARDIAC: No chest pain, edema or palpitations GI: No abdominal pain, diarrhea, constipation, heart burn, nausea or vomiting GU: +urinary incontinence PSYCHIATRIC: Denies feelings of depression or anxiety. No report of hallucinations, insomnia, paranoia, or agitation   Immunization History  Administered Date(s) Administered  . Influenza Split 06/07/2012  . Influenza,inj,Quad PF,6+ Mos 12/16/2013  . Influenza-Unspecified 06/27/2016, 07/12/2017  . PPD Test 01/02/2016  . Pneumococcal Polysaccharide-23 05/17/2009, 05/30/2012, 12/16/2013  . Td 05/17/2009  . Tdap 11/29/2011   Pertinent  Health Maintenance Due  Topic Date Due  . FOOT EXAM  12/25/2017 (Originally 05/23/2014)  . PNA vac Low Risk Adult (2 of 2 - PCV13) 03/13/2018 (Originally 12/17/2014)  . OPHTHALMOLOGY EXAM  09/18/2018 (Originally 07/10/2017)  . COLONOSCOPY  06/29/2025 (Originally 10/18/1993)  . HEMOGLOBIN A1C  01/19/2018  . LIPID PANEL  09/17/2018  . INFLUENZA VACCINE  Completed   Fall Risk  04/21/2017 06/18/2016 06/11/2016 05/28/2016 05/23/2016  Falls in the past year? Yes No No No No  Number falls in past yr: 1 - - - -  Injury with Fall? No - - - -  Risk Factor Category  - - - - -  Risk for fall due to : - - - - -  Risk for fall due to: Comment - - - - -  Follow up - - - - -      Vitals:   11/12/17 1421  BP: (!) 143/89  Pulse: 82  Resp: 17  Temp: 98.1 F (36.7 C)  TempSrc: Oral  SpO2: 97%  Weight: 259 lb 9.6 oz (117.8 kg)  Height: 5\' 8"  (1.727 m)   Body mass index is 39.47 kg/m.  Physical Exam  GENERAL APPEARANCE: Well nourished. In no acute distress. Obese SKIN:  Skin  is warm and dry.  MOUTH and THROAT: Lips are without lesions. Oral mucosa is moist and without lesions. Tongue is normal in shape, size, and color and without lesions RESPIRATORY: Breathing is even & unlabored, BS CTAB CARDIAC: RRR, no murmur,no extra heart sounds, no edema GI: Abdomen soft, normal BS, no masses, no tenderness EXTREMITIES:  Able to move X 4 extremities PSYCHIATRIC: Alert and oriented X 3. Affect and behavior are appropriate   Labs reviewed: Recent Labs    12/31/16 0910  09/17/17 09/18/17 09/25/17  NA 138   < > 137 138 137  K 5.0   < > 5.9* 5.7* 5.5*  CL 97  --   --   --   --   CO2 22  --   --   --   --   GLUCOSE  190*  --   --   --   --   BUN 22   < > 26* 24* 31*  CREATININE 1.54*   < > 1.2 1.2 1.3  CALCIUM 9.6  --   --   --   --    < > = values in this interval not displayed.   Recent Labs    11/24/16 12/31/16 0910 05/13/17  AST 8* 12 10*  ALT 7* 11 11  ALKPHOS 65 65 62  BILITOT  --  0.2  --   PROT  --  7.2  --   ALBUMIN  --  3.9  --    Recent Labs    12/31/16 0910 02/17/17 05/13/17 09/17/17  WBC 6.5 5.5 5.3 5.8  NEUTROABS 3.9  --  3 4  HGB 12.4* 13.4* 12.1* 12.9*  HCT 39.1 41 37* 39*  MCV 93  --   --   --   PLT 187 168 191 137*   Lab Results  Component Value Date   TSH 3.661 05/10/2013   Lab Results  Component Value Date   HGBA1C 7.2 10/21/2017   Lab Results  Component Value Date   CHOL 186 09/17/2017   HDL 32 (A) 09/17/2017   LDLCALC 78 09/17/2017   TRIG 382 (A) 09/17/2017   CHOLHDL 2.2 05/23/2013    Assessment/Plan  1. Diabetes mellitus due to underlying condition, uncontrolled, with renal complication ([place]) - CBGs noted to be elevated, will increase Lantus 100 unit/ml from 52 units to 56 units SQ Q HS and continue NovoLog 100 units/mL inject 10 units subcutaneous before meals for CBG > 400 Lab Results  Component Value Date   HGBA1C 7.2 10/21/2017    2. Chronic renal insufficiency, stage III (moderate) ([facility]) - will monitor Lab  Results  Component Value Date   CREATININE 1.3 09/25/2017     3. Hyperkalemia - will check CMP Lab Results  Component Value Date   K 5.5 (A) 09/25/2017     4. Essential hypertension - well-controlled, continue amlodipine 5 mg 1 tab daily and lisinopril 5 mg 1 tab daily   5. 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 Mood disorder ([facility]) - mood is stable, continue Depakote 250 mg/5 ML give 10 mL = 500 mg 3 times a day     Family/ staff Communication: Discussed with resident plan of care.  Labs/tests ordered:  CMP   Goals of care:   Long-term care   Durenda Age, NP [facility] [phone] (Monday-Friday 8:00 a.m. - 5:00 p.m.) [phone] (after hours)

## 2017-11-14 DIAGNOSIS — E119 Type 2 diabetes mellitus without complications: Secondary | ICD-10-CM | POA: Diagnosis not present

## 2017-11-14 DIAGNOSIS — E785 Hyperlipidemia, unspecified: Secondary | ICD-10-CM | POA: Diagnosis not present

## 2017-11-14 DIAGNOSIS — I10 Essential (primary) hypertension: Secondary | ICD-10-CM | POA: Diagnosis not present

## 2017-11-14 DIAGNOSIS — N183 Chronic kidney disease, stage 3 (moderate): Secondary | ICD-10-CM | POA: Diagnosis not present

## 2017-11-16 DIAGNOSIS — I10 Essential (primary) hypertension: Secondary | ICD-10-CM | POA: Diagnosis not present

## 2017-11-16 DIAGNOSIS — D649 Anemia, unspecified: Secondary | ICD-10-CM | POA: Diagnosis not present

## 2017-11-23 DIAGNOSIS — I129 Hypertensive chronic kidney disease with stage 1 through stage 4 chronic kidney disease, or unspecified chronic kidney disease: Secondary | ICD-10-CM | POA: Diagnosis not present

## 2017-11-23 DIAGNOSIS — N183 Chronic kidney disease, stage 3 (moderate): Secondary | ICD-10-CM | POA: Diagnosis not present

## 2017-11-23 DIAGNOSIS — D649 Anemia, unspecified: Secondary | ICD-10-CM | POA: Diagnosis not present

## 2017-11-23 LAB — BASIC METABOLIC PANEL
BUN: 30 — AB (ref 4–21)
CREATININE: 1.2 (ref 0.6–1.3)
GLUCOSE: 212
POTASSIUM: 6.2 — AB (ref 3.4–5.3)
Sodium: 136 — AB (ref 137–147)

## 2017-11-24 DIAGNOSIS — I10 Essential (primary) hypertension: Secondary | ICD-10-CM | POA: Diagnosis not present

## 2017-11-24 DIAGNOSIS — D649 Anemia, unspecified: Secondary | ICD-10-CM | POA: Diagnosis not present

## 2017-11-24 LAB — BASIC METABOLIC PANEL
BUN: 28 — AB (ref 4–21)
CREATININE: 1.2 (ref 0.6–1.3)
Glucose: 190
Potassium: 5.3 (ref 3.4–5.3)
SODIUM: 138 (ref 137–147)

## 2017-11-26 DIAGNOSIS — E785 Hyperlipidemia, unspecified: Secondary | ICD-10-CM | POA: Diagnosis not present

## 2017-11-26 DIAGNOSIS — I10 Essential (primary) hypertension: Secondary | ICD-10-CM | POA: Diagnosis not present

## 2017-11-26 DIAGNOSIS — E119 Type 2 diabetes mellitus without complications: Secondary | ICD-10-CM | POA: Diagnosis not present

## 2017-11-26 DIAGNOSIS — D649 Anemia, unspecified: Secondary | ICD-10-CM | POA: Diagnosis not present

## 2017-11-26 LAB — BASIC METABOLIC PANEL
BUN: 27 — AB (ref 4–21)
Creatinine: 1.3 (ref 0.6–1.3)
GLUCOSE: 96
POTASSIUM: 4.5 (ref 3.4–5.3)
Sodium: 142 (ref 137–147)

## 2017-12-04 ENCOUNTER — Encounter: Payer: Self-pay | Admitting: Adult Health

## 2017-12-04 ENCOUNTER — Non-Acute Institutional Stay (SKILLED_NURSING_FACILITY): Payer: Medicare Other | Admitting: Adult Health

## 2017-12-04 DIAGNOSIS — L219 Seborrheic dermatitis, unspecified: Secondary | ICD-10-CM | POA: Diagnosis not present

## 2017-12-04 DIAGNOSIS — E875 Hyperkalemia: Secondary | ICD-10-CM | POA: Diagnosis not present

## 2017-12-04 DIAGNOSIS — L603 Nail dystrophy: Secondary | ICD-10-CM | POA: Diagnosis not present

## 2017-12-04 DIAGNOSIS — F329 Major depressive disorder, single episode, unspecified: Secondary | ICD-10-CM | POA: Diagnosis not present

## 2017-12-04 DIAGNOSIS — F32A Depression, unspecified: Secondary | ICD-10-CM

## 2017-12-04 DIAGNOSIS — F29 Unspecified psychosis not due to a substance or known physiological condition: Secondary | ICD-10-CM | POA: Diagnosis not present

## 2017-12-04 DIAGNOSIS — Z716 Tobacco abuse counseling: Secondary | ICD-10-CM | POA: Diagnosis not present

## 2017-12-04 DIAGNOSIS — N183 Chronic kidney disease, stage 3 unspecified: Secondary | ICD-10-CM

## 2017-12-04 DIAGNOSIS — E0829 Diabetes mellitus due to underlying condition with other diabetic kidney complication: Secondary | ICD-10-CM | POA: Diagnosis not present

## 2017-12-04 DIAGNOSIS — IMO0002 Reserved for concepts with insufficient information to code with codable children: Secondary | ICD-10-CM

## 2017-12-04 DIAGNOSIS — E0865 Diabetes mellitus due to underlying condition with hyperglycemia: Secondary | ICD-10-CM | POA: Diagnosis not present

## 2017-12-04 DIAGNOSIS — R569 Unspecified convulsions: Secondary | ICD-10-CM

## 2017-12-04 DIAGNOSIS — E1151 Type 2 diabetes mellitus with diabetic peripheral angiopathy without gangrene: Secondary | ICD-10-CM | POA: Diagnosis not present

## 2017-12-04 DIAGNOSIS — B351 Tinea unguium: Secondary | ICD-10-CM | POA: Diagnosis not present

## 2017-12-04 DIAGNOSIS — I10 Essential (primary) hypertension: Secondary | ICD-10-CM

## 2017-12-04 LAB — HM DIABETES FOOT EXAM

## 2017-12-04 NOTE — Progress Notes (Signed)
Location:  [place] Room Number: 110-B Place of Service:  SNF (31) Provider:  Durenda Age, NP  Patient Care Team: Hendricks Limes, MD as PCP - General (Internal Medicine)  Extended Emergency Contact Information Primary Emergency Contact: Collier Flowers, [place] Phone: [phone] Relation: Friend Secondary Emergency Contact: [place] [address] [place] Phone: [phone] Relation: Significant other  Code Status:  Full Code  Goals of care: Advanced Directive information Advanced Directives 04/21/2017  Does Patient Have a Medical Advance Directive? No  Type of Advance Directive -  Does patient want to make changes to medical advance directive? No - Patient declined  Copy of [place] in Chart? -  Would patient like information on creating a medical advance directive? -  Pre-existing out of facility DNR order (yellow form or pink MOST form) -  Some encounter information is confidential and restricted. Go to Review Flowsheets activity to see all data.     Chief Complaint  Patient presents with  . Medical Management of Chronic Issues    Routine [facility] visit    HPI:  Pt is a 72 y.o. male seen today for medical management of chronic diseases. He is a long-term care resident of [facility].  He has a PMH of alcohol abuse, alcohol withdrawal seizure, cataract, anemia, stage 3 chronic renal insufficiency, depression, hearing impairment, HTN, HLD, and diabetes mellitus type 2. He was seen in his room today. He was noted to have erythematous rashes to his face. He was chewing tobacco. He was recently treated for Hyperkalemia. Informed him that tobacco is high in potassium. He said that he has been chewing tobacco since he was 72 years old and will not slow down or stop at this time. Latest A1c    Past Medical History:    Diagnosis Date  . Abnormality of gait 01/14/2016  . Alcohol abuse 2009   dependency as exhibited by ETOH withdrawal seizure.   . Alcohol withdrawal seizure ([place]) 2013  . Anemia 2011   Normocytic.   . Cataract   . Chronic renal insufficiency, stage III (moderate) ([place]) 02/26/2014  . Depression with anxiety   . Hearing impairment   . HTN (hypertension), benign 02/26/2014  . Hyperlipemia   . Seizures ([place])   . Type II or unspecified type diabetes mellitus with neurological manifestations, not stated as uncontrolled(250.60) 02/26/2014   Past Surgical History:  Procedure Laterality Date  . NO PAST SURGERIES      No Known Allergies  Outpatient Encounter Medications as of 12/04/2017  Medication Sig  . acetaminophen (TYLENOL) 500 MG tablet 1,000 mg every 12 (twelve) hours. Take 2 tablets by mouth every morning and every evening for pain.   Marland Kitchen amLODipine (NORVASC) 5 MG tablet Take 5 mg by mouth daily.   Marland Kitchen aspirin (ASPIRIN EC) 81 MG EC tablet Take 81 mg by mouth daily. Swallow whole.  . folic acid (FOLVITE) 1 MG tablet Take 1 tablet (1 mg total) by mouth daily.  . insulin aspart (NOVOLOG) 100 UNIT/ML injection Inject 10 Units into the skin 3 (three) times daily before meals. Give 10 units SQ with meals. Call MD if CBG >400.  Marland Kitchen insulin glargine (LANTUS) 100 UNIT/ML injection Inject 56 Units into the skin at bedtime.   Marland Kitchen ipratropium-albuterol (DUONEB) 0.5-2.5 (3) MG/3ML SOLN Take 3 mLs by  nebulization every 6 (six) hours as needed.  Marland Kitchen lisinopril (PRINIVIL,ZESTRIL) 5 MG tablet Take 5 mg by mouth daily.  . QUEtiapine (SEROQUEL) 100 MG tablet Take 100 mg by mouth 2 (two) times daily.   . sertraline (ZOLOFT) 50 MG tablet Take 50 mg by mouth daily.   Marland Kitchen thiamine (VITAMIN B-1) 100 MG tablet Take 100 mg by mouth daily.   Marland Kitchen valproic acid (DEPAKENE) 250 MG/5ML syrup Take 10 mLs (500 mg total) by mouth 3 (three) times daily.   No facility-administered encounter medications on file as of 12/04/2017.      Review of Systems  GENERAL: No change in appetite, no fatigue, no weight changes, no fever, chills or weakness MOUTH and THROAT: Denies oral discomfort, gingival pain or bleeding RESPIRATORY: no cough, SOB, DOE, wheezing, hemoptysis CARDIAC: No chest pain, edema or palpitations GI: No abdominal pain, diarrhea, constipation, heart burn, nausea or vomiting GU: Denies dysuria, frequency, hematuria, or discharge PSYCHIATRIC: Denies feelings of depression or anxiety. No report of hallucinations, insomnia, paranoia, or agitation   Immunization History  Administered Date(s) Administered  . Influenza Split 06/07/2012  . Influenza,inj,Quad PF,6+ Mos 12/16/2013  . Influenza-Unspecified 06/27/2016, 07/12/2017  . PPD Test 01/02/2016  . Pneumococcal Polysaccharide-23 05/17/2009, 05/30/2012, 12/16/2013  . Td 05/17/2009  . Tdap 11/29/2011   Pertinent  Health Maintenance Due  Topic Date Due  . FOOT EXAM  12/25/2017 (Originally 05/23/2014)  . PNA vac Low Risk Adult (2 of 2 - PCV13) 03/13/2018 (Originally 12/17/2014)  . OPHTHALMOLOGY EXAM  09/18/2018 (Originally 07/10/2017)  . COLONOSCOPY  06/29/2025 (Originally 10/18/1993)  . HEMOGLOBIN A1C  01/19/2018  . LIPID PANEL  09/17/2018  . INFLUENZA VACCINE  Completed   Fall Risk  04/21/2017 06/18/2016 06/11/2016 05/28/2016 05/23/2016  Falls in the past year? Yes No No No No  Number falls in past yr: 1 - - - -  Injury with Fall? No - - - -  Risk Factor Category  - - - - -  Risk for fall due to : - - - - -  Risk for fall due to: Comment - - - - -  Follow up - - - - -      Vitals:   12/04/17 1349  BP: 101/81  Pulse: 70  Resp: 18  Temp: 98.3 F (36.8 C)  TempSrc: Oral  SpO2: 94%  Weight: 260 lb 6.4 oz (118.1 kg)  Height: 5\' 8"  (1.727 m)   Body mass index is 39.59 kg/m.  Physical Exam  GENERAL APPEARANCE: Well nourished. In no acute distress. Obese SKIN:  Multiple rashes flat, dry, erythematous, on face MOUTH and THROAT: Lips are  without lesions. Oral mucosa is moist and without lesions. Tongue is normal in shape, size, and color and without lesions RESPIRATORY: Breathing is even & unlabored, BS CTAB CARDIAC: RRR, no murmur,no extra heart sounds, no edema GI: Abdomen soft, normal BS, no masses, no tenderness EXTREMITIES:  Able to move X 4 extremities PSYCHIATRIC: Alert and oriented X 3. Affect and behavior are appropriate   Labs reviewed: Recent Labs    12/31/16 0910  11/23/17 11/24/17 11/26/17  NA 138   < > 136* 138 142  K 5.0   < > 6.2* 5.3 4.5  CL 97  --   --   --   --   CO2 22  --   --   --   --   GLUCOSE 190*  --   --   --   --   BUN  22   < > 30* 28* 27*  CREATININE 1.54*   < > 1.2 1.2 1.3  CALCIUM 9.6  --   --   --   --    < > = values in this interval not displayed.   Recent Labs    12/31/16 0910 05/13/17  AST 12 10*  ALT 11 11  ALKPHOS 65 62  BILITOT 0.2  --   PROT 7.2  --   ALBUMIN 3.9  --    Recent Labs    12/31/16 0910 02/17/17 05/13/17 09/17/17  WBC 6.5 5.5 5.3 5.8  NEUTROABS 3.9  --  3 4  HGB 12.4* 13.4* 12.1* 12.9*  HCT 39.1 41 37* 39*  MCV 93  --   --   --   PLT 187 168 191 137*   Lab Results  Component Value Date   TSH 3.661 05/10/2013   Lab Results  Component Value Date   HGBA1C 7.2 10/21/2017   Lab Results  Component Value Date   CHOL 186 09/17/2017   HDL 32 (A) 09/17/2017   LDLCALC 78 09/17/2017   TRIG 382 (A) 09/17/2017   CHOLHDL 2.2 05/23/2013    Assessment/Plan  1. Seizure ([place]) - no recent seizures, continue Valproic acid 250 mg/5 ml give 10 ml = 500 mg TID, will  check valproate level   2. Hyperkalemia -  was recently treated with Kayexalate, will check BMP Lab Results  Component Value Date   K 4.5 11/26/2017    3. Essential hypertension - well-controlled, continue Lisinopril 5 mg daily and Amlodipine 5 mg daily   4. Diabetes mellitus due to underlying condition, uncontrolled, with renal complication (HCC) - continue Novolog 100 units/ml inject  10 units SQ before meals for CBG >400, Lantus 100 units/ml inject 56 units SQ Q HS Lab Results  Component Value Date   HGBA1C 7.2 10/21/2017     5. Chronic renal insufficiency, stage III (moderate) (HCC) - will monitor Lab Results  Component Value Date   CREATININE 1.3 11/26/2017     6. Chronic depression - continue Sertraline 50 mg daily and Quetiapine  100 mg BID   7. Tobacco abuse counseling - counselled   8. Psychosis ([place]) - he is upset today with his roommate because roommate stays in the bathroom a long time, counseled to do relaxation techniques, continue Quetiapine 100 mg 1 tab BID, refer for psych consult   9. Seborrheic dermatitis - will start Ketoconazole cream 2% topically to rashes on face BID X 4 weeks    Family/ staff Communication:  Discussed plan of care with resident.   Labs/tests ordered:  Valproic acid level and BMP  Goals of care:   Long-term care.   Durenda Age, NP [facility] [phone] (Monday-Friday 8:00 a.m. - 5:00 p.m.) [phone] (after hours)

## 2017-12-07 DIAGNOSIS — D649 Anemia, unspecified: Secondary | ICD-10-CM | POA: Diagnosis not present

## 2017-12-07 DIAGNOSIS — I10 Essential (primary) hypertension: Secondary | ICD-10-CM | POA: Diagnosis not present

## 2017-12-07 DIAGNOSIS — R569 Unspecified convulsions: Secondary | ICD-10-CM | POA: Diagnosis not present

## 2017-12-07 LAB — BASIC METABOLIC PANEL
BUN: 23 — AB (ref 4–21)
Creatinine: 1.3 (ref 0.6–1.3)
GLUCOSE: 230
Potassium: 5.4 — AB (ref 3.4–5.3)
Sodium: 136 — AB (ref 137–147)

## 2017-12-08 DIAGNOSIS — I10 Essential (primary) hypertension: Secondary | ICD-10-CM | POA: Diagnosis not present

## 2017-12-08 DIAGNOSIS — D649 Anemia, unspecified: Secondary | ICD-10-CM | POA: Diagnosis not present

## 2017-12-08 LAB — BASIC METABOLIC PANEL
BUN: 27 — AB (ref 4–21)
Creatinine: 1.2 (ref 0.6–1.3)
Glucose: 216
POTASSIUM: 5.3 (ref 3.4–5.3)
SODIUM: 135 — AB (ref 137–147)

## 2017-12-10 DIAGNOSIS — I10 Essential (primary) hypertension: Secondary | ICD-10-CM | POA: Diagnosis not present

## 2017-12-10 DIAGNOSIS — D649 Anemia, unspecified: Secondary | ICD-10-CM | POA: Diagnosis not present

## 2017-12-10 LAB — BASIC METABOLIC PANEL
BUN: 23 — AB (ref 4–21)
CREATININE: 1.3 (ref 0.6–1.3)
GLUCOSE: 179
POTASSIUM: 4.7 (ref 3.4–5.3)
Sodium: 137 (ref 137–147)

## 2017-12-22 DIAGNOSIS — Z9181 History of falling: Secondary | ICD-10-CM | POA: Diagnosis not present

## 2017-12-22 DIAGNOSIS — G40909 Epilepsy, unspecified, not intractable, without status epilepticus: Secondary | ICD-10-CM | POA: Diagnosis not present

## 2017-12-22 DIAGNOSIS — M6281 Muscle weakness (generalized): Secondary | ICD-10-CM | POA: Diagnosis not present

## 2017-12-23 DIAGNOSIS — M6281 Muscle weakness (generalized): Secondary | ICD-10-CM | POA: Diagnosis not present

## 2017-12-23 DIAGNOSIS — G40909 Epilepsy, unspecified, not intractable, without status epilepticus: Secondary | ICD-10-CM | POA: Diagnosis not present

## 2017-12-23 DIAGNOSIS — Z9181 History of falling: Secondary | ICD-10-CM | POA: Diagnosis not present

## 2017-12-24 DIAGNOSIS — Z9181 History of falling: Secondary | ICD-10-CM | POA: Diagnosis not present

## 2017-12-24 DIAGNOSIS — G40909 Epilepsy, unspecified, not intractable, without status epilepticus: Secondary | ICD-10-CM | POA: Diagnosis not present

## 2017-12-24 DIAGNOSIS — M6281 Muscle weakness (generalized): Secondary | ICD-10-CM | POA: Diagnosis not present

## 2017-12-25 DIAGNOSIS — Z9181 History of falling: Secondary | ICD-10-CM | POA: Diagnosis not present

## 2017-12-25 DIAGNOSIS — G40909 Epilepsy, unspecified, not intractable, without status epilepticus: Secondary | ICD-10-CM | POA: Diagnosis not present

## 2017-12-25 DIAGNOSIS — M6281 Muscle weakness (generalized): Secondary | ICD-10-CM | POA: Diagnosis not present

## 2017-12-28 DIAGNOSIS — G40909 Epilepsy, unspecified, not intractable, without status epilepticus: Secondary | ICD-10-CM | POA: Diagnosis not present

## 2017-12-28 DIAGNOSIS — M6281 Muscle weakness (generalized): Secondary | ICD-10-CM | POA: Diagnosis not present

## 2017-12-28 DIAGNOSIS — Z9181 History of falling: Secondary | ICD-10-CM | POA: Diagnosis not present

## 2017-12-29 DIAGNOSIS — Z9181 History of falling: Secondary | ICD-10-CM | POA: Diagnosis not present

## 2017-12-29 DIAGNOSIS — G40909 Epilepsy, unspecified, not intractable, without status epilepticus: Secondary | ICD-10-CM | POA: Diagnosis not present

## 2017-12-29 DIAGNOSIS — M6281 Muscle weakness (generalized): Secondary | ICD-10-CM | POA: Diagnosis not present

## 2017-12-30 DIAGNOSIS — Z9181 History of falling: Secondary | ICD-10-CM | POA: Diagnosis not present

## 2017-12-30 DIAGNOSIS — M6281 Muscle weakness (generalized): Secondary | ICD-10-CM | POA: Diagnosis not present

## 2017-12-30 DIAGNOSIS — G40909 Epilepsy, unspecified, not intractable, without status epilepticus: Secondary | ICD-10-CM | POA: Diagnosis not present

## 2017-12-31 ENCOUNTER — Ambulatory Visit (INDEPENDENT_AMBULATORY_CARE_PROVIDER_SITE_OTHER): Payer: Medicare Other | Admitting: Neurology

## 2017-12-31 ENCOUNTER — Non-Acute Institutional Stay (SKILLED_NURSING_FACILITY): Payer: Medicare Other | Admitting: Internal Medicine

## 2017-12-31 ENCOUNTER — Encounter: Payer: Self-pay | Admitting: Neurology

## 2017-12-31 ENCOUNTER — Encounter: Payer: Self-pay | Admitting: Internal Medicine

## 2017-12-31 VITALS — BP 100/78 | HR 81

## 2017-12-31 DIAGNOSIS — R32 Unspecified urinary incontinence: Secondary | ICD-10-CM

## 2017-12-31 DIAGNOSIS — E0829 Diabetes mellitus due to underlying condition with other diabetic kidney complication: Secondary | ICD-10-CM

## 2017-12-31 DIAGNOSIS — Z5181 Encounter for therapeutic drug level monitoring: Secondary | ICD-10-CM

## 2017-12-31 DIAGNOSIS — R269 Unspecified abnormalities of gait and mobility: Secondary | ICD-10-CM | POA: Diagnosis not present

## 2017-12-31 DIAGNOSIS — Z720 Tobacco use: Secondary | ICD-10-CM | POA: Insufficient documentation

## 2017-12-31 DIAGNOSIS — N3941 Urge incontinence: Secondary | ICD-10-CM | POA: Diagnosis not present

## 2017-12-31 DIAGNOSIS — R569 Unspecified convulsions: Secondary | ICD-10-CM | POA: Diagnosis not present

## 2017-12-31 DIAGNOSIS — E0865 Diabetes mellitus due to underlying condition with hyperglycemia: Secondary | ICD-10-CM | POA: Diagnosis not present

## 2017-12-31 DIAGNOSIS — Z9181 History of falling: Secondary | ICD-10-CM | POA: Diagnosis not present

## 2017-12-31 DIAGNOSIS — M6281 Muscle weakness (generalized): Secondary | ICD-10-CM | POA: Diagnosis not present

## 2017-12-31 DIAGNOSIS — G40909 Epilepsy, unspecified, not intractable, without status epilepticus: Secondary | ICD-10-CM | POA: Diagnosis not present

## 2017-12-31 DIAGNOSIS — IMO0002 Reserved for concepts with insufficient information to code with codable children: Secondary | ICD-10-CM

## 2017-12-31 NOTE — Progress Notes (Signed)
Reason for visit: Seizures  William [name] is an 72 y.o. male  History of present illness:  William [name] is a 72 year old right-handed white male with a history of alcohol abuse previously and a history of seizures.  The patient is on Depakote, he seems to tolerate this fairly well.  He has a gait disorder, he generally is mobilizing in a wheelchair, he can stand up but really cannot effectively walk.  He does take a few steps with physical therapy.  The patient reports at least one fall since last seen.  He reports no further seizures.  He complains of problems with urinary incontinence that has been an issue for him recently.  The patient has to wear adult diapers.  The patient resides in an extended care facility, he needs some assistance with bathing and dressing.  He comes to this office for an evaluation.  Past Medical History:  Diagnosis Date  . Abnormality of gait 01/14/2016  . Alcohol abuse 2009   dependency as exhibited by ETOH withdrawal seizure.   . Alcohol withdrawal seizure ([place]) 2013  . Anemia 2011   Normocytic.   . Cataract   . Chronic renal insufficiency, stage III (moderate) ([place]) 02/26/2014  . Depression with anxiety   . Hearing impairment   . HTN (hypertension), benign 02/26/2014  . Hyperlipemia   . Seizures ([place])   . Type II or unspecified type diabetes mellitus with neurological manifestations, not stated as uncontrolled(250.60) 02/26/2014    Past Surgical History:  Procedure Laterality Date  . NO PAST SURGERIES      Family History  Family history unknown: Yes    Social history:  reports that he has quit smoking. His smoking use included cigars. His smokeless tobacco use includes chew. He reports that he does not drink alcohol or use drugs.   No Known Allergies  Medications:  Prior to Admission medications   Medication Sig Start Date End Date Taking? Authorizing Provider  amLODipine (NORVASC) 5 MG tablet Take 5 mg by mouth daily.  05/08/16  Yes  [provider]  aspirin (ASPIRIN EC) 81 MG EC tablet Take 81 mg by mouth daily. Swallow whole.   Yes [provider]  folic acid (FOLVITE) 1 MG tablet Take 1 tablet (1 mg total) by mouth daily. 05/07/15  Yes Liberty Handy, MD  insulin aspart (NOVOLOG) 100 UNIT/ML injection Inject 10 Units into the skin 3 (three) times daily before meals. Give 10 units SQ with meals. Call MD if CBG >400.  Takes at [place], 1130, 1630   Yes [provider]  insulin glargine (LANTUS) 100 UNIT/ML injection Inject 56 Units into the skin at bedtime.    Yes [provider]  ipratropium-albuterol (DUONEB) 0.5-2.5 (3) MG/3ML SOLN Take 3 mLs by nebulization every 6 (six) hours as needed.   Yes [provider]  ketoconazole (NIZORAL) 2 % cream Apply 1 application topically daily. For rash on face. Stop date: 01/02/18   Yes [provider]  lisinopril (PRINIVIL,ZESTRIL) 5 MG tablet Take 5 mg by mouth daily.   Yes [provider]  QUEtiapine (SEROQUEL) 100 MG tablet Take 100 mg by mouth 2 (two) times daily.    Yes [provider]  sertraline (ZOLOFT) 50 MG tablet Take 50 mg by mouth daily.    Yes [provider]  thiamine (VITAMIN B-1) 100 MG tablet Take 100 mg by mouth daily.    Yes [provider]  valproic acid (DEPAKENE) 250 MG/5ML syrup  Take 10 mLs (500 mg total) by mouth 3 (three) times daily. 01/02/16  Yes Liberty Handy, MD    ROS:  Out of a complete 14 system review of symptoms, the patient complains only of the following symptoms, and all other reviewed systems are negative.  Urinary incontinence Walking problems  Blood pressure 100/78, pulse 81, SpO2 92 %.  Physical Exam  General: The patient is alert and cooperative at the time of the examination.  The patient is markedly obese.  Skin: 1+ edema below the knees is seen bilaterally.   Neurologic Exam  Mental status: The patient is alert and oriented x 3 at the time of the  examination. The patient has apparent normal recent and remote memory, with an apparently normal attention span and concentration ability.   Cranial nerves: Facial symmetry is present. Speech is normal, no aphasia or dysarthria is noted. Extraocular movements are full. Visual fields are full.  The patient is quite hard of hearing.  Motor: The patient has good strength in all 4 extremities, with some exception of slight weakness with hip flexion bilaterally..  Sensory examination: Soft touch sensation is symmetric on the face, arms, and legs.  Coordination: The patient has good finger-nose-finger bilaterally.  The patient has difficulty performing heel-to-shin on both sides.  Gait and station: The patient is able to stand with assistance.  His stance is wide-based, he cannot effectively walk once he is up.  Reflexes: Deep tendon reflexes are symmetric, but are depressed.   Assessment/Plan:  1.  History of seizures, well controlled  2.  Gait disorder  3.  Reports of urinary incontinence  The patient will have blood work done today, he will continue the Depakote 500 mg 3 times daily.  The patient will be sent to urology for evaluation of the urinary incontinence.  He will follow-up in 1 year.  Jill Alexanders MD 12/31/2017 7:50 AM  [facility] [address] [place], [place] [address]  Phone [phone] Fax [phone]

## 2017-12-31 NOTE — Progress Notes (Signed)
NURSING HOME LOCATION:  [facility] ROOM NUMBER:  [id number]  CODE STATUS:  Full Code  PCP:  Hendricks Limes, MD  [place] [address]  This is a nursing facility follow up of chronic medical diagnoses.  Interim medical record and care since last [place] visit was updated with review of diagnostic studies and change in clinical status since last visit were documented.  HPI: The patient is permanent resident of facility with medical diagnoses including seizure disorder, essential hypertension, anxiety/depression, dyslipidemia, CKD stage 3, history of alcohol abuse, and poorly controlled diabetes. Fasting glucoses range from 209 up to 306. Lunch and dinner glucoses have been up to the 350-390 range. He maintains he is compliant with his diet, staff states this is not the case. Surprisingly his control is fairly good. His A1c was 7.2% on 10/21/17 Recently has had hyperkalemia, it was question that this might be related to excess use of chewing tobacco. His renal function has remained stable in the range of 1.3.  Dr. Jannifer Franklin, Neurologist saw the patient this morning. He did order CMET, CBC with differential, and valproic acid level for therapeutic drug monitoring purposes. Because of symptoms of urinary incontinence referral was made by Dr Jannifer Franklin to [facility] in [place].  Review of systems: He is actually oriented to date but could not remember Dr Tobey Grim name whom he saw this morning. He describes some incontinence of urine without other GU symptoms.Apparently he also sometimes loses bowel control.  He specifically denies polyuria and polyphagia. In reference to dietary compliance, the bedside table was covered with multiple kinds of candies as well as cookies and [place]. I explained that each serving of [facility] had almost 12 teaspoons of sugar in the form of high fructose corn syrup. His response was a blank stare. Usually chewing tobacco has increased  amounts of sugar in it. There were no ingredients listed on the packages. In bold letters was a warning about oral cancer. He stated he did not believe this because "I've been chewing it since I was 7".   Physical exam:  Pertinent or positive findings: He is somnolent. He has shaven today and combed his hair which is atypical. He does have pattern alopecia. There is thinning of the lateral eyebrows. Ptosis greater left than right. He is markedly hard of hearing. There is slight decrease of the left nasolabial fold. He is edentulous. He has a large ball of chewing tobacco in his mouth. There is tobacco spittle at the corner of the left mouth. He has a slight S4 type gallop cadence. He has low grade expiratory wheezing & rhonchi. Abdomen is massive. Pedal pulses are decreased. The extremities are weak, the left upper extremity is less weak than the others.  General appearance:  no acute distress, increased work of breathing is present despite auscultory findings.   Lymphatic: No lymphadenopathy about the head, neck, axilla. Eyes: No conjunctival inflammation or lid edema is present. There is no scleral icterus. Ears:  External ear exam shows no significant lesions or deformities.   Nose:  External nasal examination shows no deformity or inflammation. Nasal mucosa are pink and moist without lesions, exudates Oral exam:  Lips and gums are healthy appearing.  Neck:  No thyromegaly, masses, tenderness noted.    Heart:  No murmur, click, rub .  Abdomen:Bowel sounds are normal. Abdomen is soft and nontender with no organomegaly, hernias,masses. GU: deferred  Extremities:  No cyanosis, clubbing,edema  Skin: Warm &  dry w/o tenting. No significant lesions or rash.  See summary under each active problem in the Problem List with associated updated therapeutic plan

## 2017-12-31 NOTE — Assessment & Plan Note (Signed)
12/31/17 despite  poorly controlled diabetes, he does not have polydipsia, polyphagia, or polyuria Dr. Jannifer Franklin has ordered a urology consultation

## 2017-12-31 NOTE — Assessment & Plan Note (Signed)
Valproic level pending

## 2017-12-31 NOTE — Patient Instructions (Signed)
See assessment and plan under each diagnosis in the problem list and acutely for this visit. Because of his somnolence and obesity, TSH will be updated.

## 2017-12-31 NOTE — Assessment & Plan Note (Signed)
Non compliance compromises ability to control diabetes; he has no interest in changing behavior

## 2017-12-31 NOTE — Patient Instructions (Signed)
   We will get a referral to Urology for the urinary incontinence.

## 2017-12-31 NOTE — Assessment & Plan Note (Signed)
12/31/17 patient states he does not believe smokeless tobacco causes oral cancer as the tobacco pouch states in bold letters

## 2018-01-01 ENCOUNTER — Telehealth: Payer: Self-pay

## 2018-01-01 DIAGNOSIS — M6281 Muscle weakness (generalized): Secondary | ICD-10-CM | POA: Diagnosis not present

## 2018-01-01 DIAGNOSIS — G40909 Epilepsy, unspecified, not intractable, without status epilepticus: Secondary | ICD-10-CM | POA: Diagnosis not present

## 2018-01-01 DIAGNOSIS — Z9181 History of falling: Secondary | ICD-10-CM | POA: Diagnosis not present

## 2018-01-01 LAB — CBC WITH DIFFERENTIAL/PLATELET
BASOS ABS: 0 10*3/uL (ref 0.0–0.2)
Basos: 0 %
EOS (ABSOLUTE): 0.2 10*3/uL (ref 0.0–0.4)
Eos: 4 %
Hematocrit: 40.7 % (ref 37.5–51.0)
Hemoglobin: 13.2 g/dL (ref 13.0–17.7)
IMMATURE GRANS (ABS): 0.1 10*3/uL (ref 0.0–0.1)
Immature Granulocytes: 2 %
LYMPHS: 38 %
Lymphocytes Absolute: 1.9 10*3/uL (ref 0.7–3.1)
MCH: 30.8 pg (ref 26.6–33.0)
MCHC: 32.4 g/dL (ref 31.5–35.7)
MCV: 95 fL (ref 79–97)
MONOS ABS: 0.5 10*3/uL (ref 0.1–0.9)
Monocytes: 9 %
NEUTROS PCT: 47 %
Neutrophils Absolute: 2.3 10*3/uL (ref 1.4–7.0)
PLATELETS: 182 10*3/uL (ref 150–379)
RBC: 4.29 x10E6/uL (ref 4.14–5.80)
RDW: 14.5 % (ref 12.3–15.4)
WBC: 5 10*3/uL (ref 3.4–10.8)

## 2018-01-01 LAB — COMPREHENSIVE METABOLIC PANEL
A/G RATIO: 1.6 (ref 1.2–2.2)
ALT: 15 IU/L (ref 0–44)
AST: 10 IU/L (ref 0–40)
Albumin: 4.2 g/dL (ref 3.5–4.8)
Alkaline Phosphatase: 63 IU/L (ref 39–117)
BILIRUBIN TOTAL: 0.3 mg/dL (ref 0.0–1.2)
BUN/Creatinine Ratio: 22 (ref 10–24)
BUN: 34 mg/dL — ABNORMAL HIGH (ref 8–27)
CHLORIDE: 103 mmol/L (ref 96–106)
CO2: 23 mmol/L (ref 20–29)
Calcium: 9.6 mg/dL (ref 8.6–10.2)
Creatinine, Ser: 1.55 mg/dL — ABNORMAL HIGH (ref 0.76–1.27)
GFR calc Af Amer: 50 mL/min/{1.73_m2} — ABNORMAL LOW (ref >59)
GFR calc non Af Amer: 43 mL/min/{1.73_m2} — ABNORMAL LOW (ref >59)
GLOBULIN, TOTAL: 2.7 g/dL (ref 1.5–4.5)
Glucose: 213 mg/dL — ABNORMAL HIGH (ref 65–99)
POTASSIUM: 5.9 mmol/L — AB (ref 3.5–5.2)
SODIUM: 141 mmol/L (ref 134–144)
Total Protein: 6.9 g/dL (ref 6.0–8.5)

## 2018-01-01 LAB — VALPROIC ACID LEVEL: VALPROIC ACID LVL: 42 ug/mL — AB (ref 50–100)

## 2018-01-01 NOTE — Telephone Encounter (Signed)
I called pt, spoke to Ramatou at [facility], she asked me to fax pt's lab results to [phone].  Will route results to PCP as well.

## 2018-01-01 NOTE — Telephone Encounter (Signed)
-----   Message from Kathrynn Ducking, MD sent at 01/01/2018  7:51 AM EDT -----  Blood work reveals chronic renal insufficiency that appears to be slightly worse from recent blood work, may be component of dehydration.  Potassium is elevated, this needs to be followed, blood work will be sent to primary care physician.  Blood count is normal, Depakote level is slightly low, but given excellent control with seizures, would not alter medication dosing.  Please call regarding blood work results. ----- Message ----- From: Lavone Neri Lab Results In Sent: 01/01/2018   7:42 AM To: Kathrynn Ducking, MD

## 2018-01-04 DIAGNOSIS — I10 Essential (primary) hypertension: Secondary | ICD-10-CM | POA: Diagnosis not present

## 2018-01-04 DIAGNOSIS — E039 Hypothyroidism, unspecified: Secondary | ICD-10-CM | POA: Diagnosis not present

## 2018-01-04 DIAGNOSIS — Z9181 History of falling: Secondary | ICD-10-CM | POA: Diagnosis not present

## 2018-01-04 DIAGNOSIS — M6281 Muscle weakness (generalized): Secondary | ICD-10-CM | POA: Diagnosis not present

## 2018-01-04 DIAGNOSIS — G40909 Epilepsy, unspecified, not intractable, without status epilepticus: Secondary | ICD-10-CM | POA: Diagnosis not present

## 2018-01-04 LAB — TSH: TSH: 3.06 (ref 0.41–5.90)

## 2018-01-05 DIAGNOSIS — G40909 Epilepsy, unspecified, not intractable, without status epilepticus: Secondary | ICD-10-CM | POA: Diagnosis not present

## 2018-01-05 DIAGNOSIS — Z9181 History of falling: Secondary | ICD-10-CM | POA: Diagnosis not present

## 2018-01-05 DIAGNOSIS — M6281 Muscle weakness (generalized): Secondary | ICD-10-CM | POA: Diagnosis not present

## 2018-01-05 DIAGNOSIS — E119 Type 2 diabetes mellitus without complications: Secondary | ICD-10-CM | POA: Diagnosis not present

## 2018-01-05 DIAGNOSIS — D649 Anemia, unspecified: Secondary | ICD-10-CM | POA: Diagnosis not present

## 2018-01-05 LAB — BASIC METABOLIC PANEL
BUN: 31 — AB (ref 4–21)
CREATININE: 1.2 (ref 0.6–1.3)
Glucose: 222
Potassium: 4.9 (ref 3.4–5.3)
Sodium: 135 — AB (ref 137–147)

## 2018-01-06 DIAGNOSIS — G40909 Epilepsy, unspecified, not intractable, without status epilepticus: Secondary | ICD-10-CM | POA: Diagnosis not present

## 2018-01-06 DIAGNOSIS — M6281 Muscle weakness (generalized): Secondary | ICD-10-CM | POA: Diagnosis not present

## 2018-01-06 DIAGNOSIS — Z9181 History of falling: Secondary | ICD-10-CM | POA: Diagnosis not present

## 2018-01-07 DIAGNOSIS — G40909 Epilepsy, unspecified, not intractable, without status epilepticus: Secondary | ICD-10-CM | POA: Diagnosis not present

## 2018-01-07 DIAGNOSIS — Z9181 History of falling: Secondary | ICD-10-CM | POA: Diagnosis not present

## 2018-01-07 DIAGNOSIS — M6281 Muscle weakness (generalized): Secondary | ICD-10-CM | POA: Diagnosis not present

## 2018-01-08 DIAGNOSIS — Z9181 History of falling: Secondary | ICD-10-CM | POA: Diagnosis not present

## 2018-01-08 DIAGNOSIS — M6281 Muscle weakness (generalized): Secondary | ICD-10-CM | POA: Diagnosis not present

## 2018-01-08 DIAGNOSIS — G40909 Epilepsy, unspecified, not intractable, without status epilepticus: Secondary | ICD-10-CM | POA: Diagnosis not present

## 2018-01-09 DIAGNOSIS — Z9181 History of falling: Secondary | ICD-10-CM | POA: Diagnosis not present

## 2018-01-09 DIAGNOSIS — G40909 Epilepsy, unspecified, not intractable, without status epilepticus: Secondary | ICD-10-CM | POA: Diagnosis not present

## 2018-01-09 DIAGNOSIS — M6281 Muscle weakness (generalized): Secondary | ICD-10-CM | POA: Diagnosis not present

## 2018-01-11 DIAGNOSIS — M6281 Muscle weakness (generalized): Secondary | ICD-10-CM | POA: Diagnosis not present

## 2018-01-11 DIAGNOSIS — G40909 Epilepsy, unspecified, not intractable, without status epilepticus: Secondary | ICD-10-CM | POA: Diagnosis not present

## 2018-01-11 DIAGNOSIS — Z9181 History of falling: Secondary | ICD-10-CM | POA: Diagnosis not present

## 2018-01-12 DIAGNOSIS — G40909 Epilepsy, unspecified, not intractable, without status epilepticus: Secondary | ICD-10-CM | POA: Diagnosis not present

## 2018-01-12 DIAGNOSIS — M6281 Muscle weakness (generalized): Secondary | ICD-10-CM | POA: Diagnosis not present

## 2018-01-12 DIAGNOSIS — Z9181 History of falling: Secondary | ICD-10-CM | POA: Diagnosis not present

## 2018-01-13 ENCOUNTER — Non-Acute Institutional Stay (SKILLED_NURSING_FACILITY): Payer: Medicare Other | Admitting: Adult Health

## 2018-01-13 ENCOUNTER — Encounter: Payer: Self-pay | Admitting: Adult Health

## 2018-01-13 DIAGNOSIS — F29 Unspecified psychosis not due to a substance or known physiological condition: Secondary | ICD-10-CM | POA: Diagnosis not present

## 2018-01-13 DIAGNOSIS — Z87898 Personal history of other specified conditions: Secondary | ICD-10-CM

## 2018-01-13 DIAGNOSIS — R569 Unspecified convulsions: Secondary | ICD-10-CM

## 2018-01-13 DIAGNOSIS — F1011 Alcohol abuse, in remission: Secondary | ICD-10-CM

## 2018-01-13 DIAGNOSIS — I10 Essential (primary) hypertension: Secondary | ICD-10-CM | POA: Diagnosis not present

## 2018-01-13 DIAGNOSIS — Z9181 History of falling: Secondary | ICD-10-CM | POA: Diagnosis not present

## 2018-01-13 DIAGNOSIS — Z720 Tobacco use: Secondary | ICD-10-CM | POA: Diagnosis not present

## 2018-01-13 DIAGNOSIS — M6281 Muscle weakness (generalized): Secondary | ICD-10-CM | POA: Diagnosis not present

## 2018-01-13 DIAGNOSIS — IMO0002 Reserved for concepts with insufficient information to code with codable children: Secondary | ICD-10-CM

## 2018-01-13 DIAGNOSIS — E0865 Diabetes mellitus due to underlying condition with hyperglycemia: Secondary | ICD-10-CM

## 2018-01-13 DIAGNOSIS — F329 Major depressive disorder, single episode, unspecified: Secondary | ICD-10-CM | POA: Diagnosis not present

## 2018-01-13 DIAGNOSIS — G40909 Epilepsy, unspecified, not intractable, without status epilepticus: Secondary | ICD-10-CM | POA: Diagnosis not present

## 2018-01-13 DIAGNOSIS — N183 Chronic kidney disease, stage 3 unspecified: Secondary | ICD-10-CM

## 2018-01-13 DIAGNOSIS — E0829 Diabetes mellitus due to underlying condition with other diabetic kidney complication: Secondary | ICD-10-CM

## 2018-01-13 DIAGNOSIS — F32A Depression, unspecified: Secondary | ICD-10-CM

## 2018-01-13 NOTE — Progress Notes (Signed)
Location:  [facility] Room Number: 129-A Place of Service:  SNF (31) Provider:  Durenda Age, NP  Patient Care Team: Hendricks Limes, MD as PCP - General (Internal Medicine)  Extended Emergency Contact Information Primary Emergency Contact: Collier Flowers, [place] Phone: [phone] Relation: Friend Secondary Emergency Contact: [place] [address] [place] Phone: [phone] Relation: Significant other  Code Status:  Full Code  Goals of care: Advanced Directive information Advanced Directives 04/21/2017  Does Patient Have a Medical Advance Directive? No  Type of Advance Directive -  Does patient want to make changes to medical advance directive? No - Patient declined  Copy of [place] in Chart? -  Would patient like information on creating a medical advance directive? -  Pre-existing out of facility DNR order (yellow form or pink MOST form) -  Some encounter information is confidential and restricted. Go to Review Flowsheets activity to see all data.     Chief Complaint  Patient presents with  . Discharge Note    Patient seen for discharge visit, is transferring to another SNF    HPI:  Pt is a 72 y.o. male seen today for a discharge visit.  He is transferring to another SNF today.  He has a PMH of alcohol abuse, alcohol withdrawal seizures, cataracts, anemia, stage 3 chronic renal insufficiency, depression, HTN, hearing impairment, HLD, and DM2. He has been admitted to [facility] on 08/04/16. He chews tobacco everyday.  Latest hgbA1C is 7.2. CBGs has been ranging from 368 - 137.  He takes Valproic acid for seizure and no seizure episode in the past 6 months.   Past Medical History:  Diagnosis Date  . Abnormality of gait 01/14/2016  . Alcohol abuse 2009   dependency as exhibited by ETOH withdrawal seizure.   .  Alcohol withdrawal seizure ([place]) 2013  . Anemia 2011   Normocytic.   . Cataract   . Chronic renal insufficiency, stage III (moderate) ([place]) 02/26/2014  . Depression with anxiety   . Hearing impairment   . HTN (hypertension), benign 02/26/2014  . Hyperlipemia   . Seizures ([place])   . Type II or unspecified type diabetes mellitus with neurological manifestations, not stated as uncontrolled(250.60) 02/26/2014   Past Surgical History:  Procedure Laterality Date  . NO PAST SURGERIES      No Known Allergies  Outpatient Encounter Medications as of 01/13/2018  Medication Sig  . amLODipine (NORVASC) 5 MG tablet Take 5 mg by mouth daily.   Marland Kitchen aspirin (ASPIRIN EC) 81 MG EC tablet Take 81 mg by mouth daily. Swallow whole.  . folic acid (FOLVITE) 1 MG tablet Take 1 tablet (1 mg total) by mouth daily.  . insulin aspart (NOVOLOG) 100 UNIT/ML injection Inject 10 Units into the skin 3 (three) times daily before meals. Give 10 units SQ with meals. Call MD if CBG >400.  Takes at [place], 1130, 1630  . insulin glargine (LANTUS) 100 UNIT/ML injection Inject 58 Units into the skin at bedtime.   Marland Kitchen ipratropium-albuterol (DUONEB) 0.5-2.5 (3) MG/3ML SOLN Take 3 mLs by nebulization every 6 (six) hours as needed.  Marland Kitchen lisinopril (PRINIVIL,ZESTRIL) 5 MG tablet Take 5 mg by mouth daily.  . QUEtiapine (SEROQUEL) 100 MG tablet Take 100 mg by mouth 2 (two) times daily.   . sertraline (ZOLOFT) 50 MG tablet  Take 50 mg by mouth daily.   Marland Kitchen thiamine (VITAMIN B-1) 100 MG tablet Take 100 mg by mouth daily.   Marland Kitchen valproic acid (DEPAKENE) 250 MG/5ML syrup Take 10 mLs (500 mg total) by mouth 3 (three) times daily.  . [DISCONTINUED] ketoconazole (NIZORAL) 2 % cream Apply 1 application topically daily. For rash on face. Stop date: 01/02/18   No facility-administered encounter medications on file as of 01/13/2018.     Review of Systems  GENERAL: No change in appetite, no fatigue, no weight changes, no fever, chills or weakness MOUTH and  THROAT: Denies oral discomfort, gingival pain or bleeding, pain from teeth or hoarseness   RESPIRATORY: no cough, SOB, DOE, wheezing, hemoptysis CARDIAC: No chest pain, edema or palpitations GI: No abdominal pain, diarrhea, constipation, heart burn, nausea or vomiting PSYCHIATRIC: Denies feelings of depression or anxiety. No report of hallucinations, insomnia, paranoia, or agitation   Immunization History  Administered Date(s) Administered  . Influenza Split 06/07/2012  . Influenza,inj,Quad PF,6+ Mos 12/16/2013  . Influenza-Unspecified 06/27/2016, 07/12/2017  . PPD Test 01/02/2016  . Pneumococcal Polysaccharide-23 05/17/2009, 05/30/2012, 12/16/2013  . Td 05/17/2009  . Tdap 11/29/2011   Pertinent  Health Maintenance Due  Topic Date Due  . PNA vac Low Risk Adult (2 of 2 - PCV13) 03/13/2018 (Originally 12/17/2014)  . OPHTHALMOLOGY EXAM  09/18/2018 (Originally 07/10/2017)  . COLONOSCOPY  06/29/2025 (Originally 10/18/1993)  . HEMOGLOBIN A1C  01/19/2018  . INFLUENZA VACCINE  04/29/2018  . LIPID PANEL  09/17/2018  . FOOT EXAM  12/05/2018   Fall Risk  04/21/2017 06/18/2016 06/11/2016 05/28/2016 05/23/2016  Falls in the past year? Yes No No No No  Number falls in past yr: 1 - - - -  Injury with Fall? No - - - -  Risk Factor Category  - - - - -  Risk for fall due to : - - - - -  Risk for fall due to: Comment - - - - -  Follow up - - - - -    Vitals:   01/13/18 1126  BP: (!) 141/88  Pulse: 70  Resp: 18  Temp: 98.7 F (37.1 C)  TempSrc: Oral  SpO2: 94%  Weight: 255 lb 12.8 oz (116 kg)  Height: 5\' 8"  (1.727 m)   Body mass index is 38.89 kg/m.  Physical Exam  GENERAL APPEARANCE: Well nourished. In no acute distress. Obese SKIN:  Skin is warm and dry.  RESPIRATORY: Breathing is even & unlabored, BS CTAB CARDIAC: RRR, no murmur,no extra heart sounds, no edema GI: Abdomen soft, normal BS, no tenderness,enlarged EXTREMITIES:  Able to move X 4 extremities PSYCHIATRIC: Alert to self  and place and disoriented to time. Affect and behavior are appropriate  Labs reviewed: Recent Labs    12/10/17 12/31/17 0806 01/05/18  NA 137 141 135*  K 4.7 5.9* 4.9  CL  --  103  --   CO2  --  23  --   GLUCOSE  --  213*  --   BUN 23* 34* 31*  CREATININE 1.3 1.55* 1.2  CALCIUM  --  9.6  --    Recent Labs    05/13/17 12/31/17 0806  AST 10* 10  ALT 11 15  ALKPHOS 62 63  BILITOT  --  0.3  PROT  --  6.9  ALBUMIN  --  4.2   Recent Labs    05/13/17 09/17/17 12/31/17 0806  WBC 5.3 5.8 5.0  NEUTROABS 3 4 2.3  HGB 12.1* 12.9*  13.2  HCT 37* 39* 40.7  MCV  --   --  95  PLT 191 137* 182   Lab Results  Component Value Date   TSH 3.06 01/04/2018   Lab Results  Component Value Date   HGBA1C 7.2 10/21/2017   Lab Results  Component Value Date   CHOL 186 09/17/2017   HDL 32 (A) 09/17/2017   LDLCALC 78 09/17/2017   TRIG 382 (A) 09/17/2017   CHOLHDL 2.2 05/23/2013     Assessment/Plan  1. Diabetes mellitus due to underlying condition, uncontrolled, with renal complication (HCC) - continue Novolog 100 units/ml  Inject 10 units SQ TID before meals and Lantus 100 units/ml inject 58 units Q HS Lab Results  Component Value Date   HGBA1C 7.2 10/21/2017     2. Smokeless tobacco use - has been counseled several times but said that he has been "chewing tobacco since he was 72 years old and won't stop now'   3. Seizure ([place]) - no recent seizure, continue Valproic acid 250 mg/5 ml give 10 ml = 500 mg TID   4. Psychosis, unspecified psychosis type ([place]) -  Stable, will continue Quetiapine 100 mg 1 tab BID   5. Chronic renal insufficiency, stage III (moderate) (HCC) - stable Lab Results  Component Value Date   CREATININE 1.2 01/05/2018     6. History of alcohol abuse - continue Vitamin B1 144 mg daily, folic acid 1 mg daily   7. Essential hypertension -  Well-controlled, continue Amlodipine 5 mg daily, Lisinopril 5 mg daily    8. Depression - mood is stable, continue  Sertraline 50 mg daily, followed-up by [facility] NP     I have filled out patient's discharge paperwork.  Total discharge time: Less than 30 minutes  Discharge time involved coordination of the discharge process with Education officer, museum, nursing staff.    Durenda Age, NP [facility] [phone] (Monday-Friday 8:00 a.m. - 5:00 p.m.) [phone] (after hours)

## 2018-01-14 DIAGNOSIS — M6281 Muscle weakness (generalized): Secondary | ICD-10-CM | POA: Diagnosis not present

## 2018-01-14 DIAGNOSIS — G40909 Epilepsy, unspecified, not intractable, without status epilepticus: Secondary | ICD-10-CM | POA: Diagnosis not present

## 2018-01-14 DIAGNOSIS — Z9181 History of falling: Secondary | ICD-10-CM | POA: Diagnosis not present

## 2018-01-15 DIAGNOSIS — E119 Type 2 diabetes mellitus without complications: Secondary | ICD-10-CM | POA: Diagnosis not present

## 2018-01-15 DIAGNOSIS — G40909 Epilepsy, unspecified, not intractable, without status epilepticus: Secondary | ICD-10-CM | POA: Diagnosis not present

## 2018-01-15 DIAGNOSIS — Z9181 History of falling: Secondary | ICD-10-CM | POA: Diagnosis not present

## 2018-01-15 DIAGNOSIS — R41841 Cognitive communication deficit: Secondary | ICD-10-CM | POA: Diagnosis not present

## 2018-01-15 DIAGNOSIS — R262 Difficulty in walking, not elsewhere classified: Secondary | ICD-10-CM | POA: Diagnosis not present

## 2018-01-15 DIAGNOSIS — G309 Alzheimer's disease, unspecified: Secondary | ICD-10-CM | POA: Diagnosis not present

## 2018-01-15 DIAGNOSIS — F0390 Unspecified dementia without behavioral disturbance: Secondary | ICD-10-CM | POA: Diagnosis not present

## 2018-01-15 DIAGNOSIS — D509 Iron deficiency anemia, unspecified: Secondary | ICD-10-CM | POA: Diagnosis not present

## 2018-01-16 DIAGNOSIS — R262 Difficulty in walking, not elsewhere classified: Secondary | ICD-10-CM | POA: Diagnosis not present

## 2018-01-16 DIAGNOSIS — D509 Iron deficiency anemia, unspecified: Secondary | ICD-10-CM | POA: Diagnosis not present

## 2018-01-16 DIAGNOSIS — R41841 Cognitive communication deficit: Secondary | ICD-10-CM | POA: Diagnosis not present

## 2018-01-16 DIAGNOSIS — E119 Type 2 diabetes mellitus without complications: Secondary | ICD-10-CM | POA: Diagnosis not present

## 2018-01-16 DIAGNOSIS — F0390 Unspecified dementia without behavioral disturbance: Secondary | ICD-10-CM | POA: Diagnosis not present

## 2018-01-16 DIAGNOSIS — G309 Alzheimer's disease, unspecified: Secondary | ICD-10-CM | POA: Diagnosis not present

## 2018-01-18 DIAGNOSIS — I10 Essential (primary) hypertension: Secondary | ICD-10-CM | POA: Diagnosis not present

## 2018-01-18 DIAGNOSIS — G40802 Other epilepsy, not intractable, without status epilepticus: Secondary | ICD-10-CM | POA: Diagnosis not present

## 2018-01-18 DIAGNOSIS — D509 Iron deficiency anemia, unspecified: Secondary | ICD-10-CM | POA: Diagnosis not present

## 2018-01-18 DIAGNOSIS — R41841 Cognitive communication deficit: Secondary | ICD-10-CM | POA: Diagnosis not present

## 2018-01-18 DIAGNOSIS — R262 Difficulty in walking, not elsewhere classified: Secondary | ICD-10-CM | POA: Diagnosis not present

## 2018-01-18 DIAGNOSIS — F0390 Unspecified dementia without behavioral disturbance: Secondary | ICD-10-CM | POA: Diagnosis not present

## 2018-01-18 DIAGNOSIS — F22 Delusional disorders: Secondary | ICD-10-CM | POA: Diagnosis not present

## 2018-01-18 DIAGNOSIS — G309 Alzheimer's disease, unspecified: Secondary | ICD-10-CM | POA: Diagnosis not present

## 2018-01-18 DIAGNOSIS — E119 Type 2 diabetes mellitus without complications: Secondary | ICD-10-CM | POA: Diagnosis not present

## 2018-01-19 DIAGNOSIS — F0390 Unspecified dementia without behavioral disturbance: Secondary | ICD-10-CM | POA: Diagnosis not present

## 2018-01-19 DIAGNOSIS — R262 Difficulty in walking, not elsewhere classified: Secondary | ICD-10-CM | POA: Diagnosis not present

## 2018-01-19 DIAGNOSIS — R69 Illness, unspecified: Secondary | ICD-10-CM | POA: Diagnosis not present

## 2018-01-19 DIAGNOSIS — E1100 Type 2 diabetes mellitus with hyperosmolarity without nonketotic hyperglycemic-hyperosmolar coma (NKHHC): Secondary | ICD-10-CM | POA: Diagnosis not present

## 2018-01-19 DIAGNOSIS — D649 Anemia, unspecified: Secondary | ICD-10-CM | POA: Diagnosis not present

## 2018-01-19 DIAGNOSIS — D509 Iron deficiency anemia, unspecified: Secondary | ICD-10-CM | POA: Diagnosis not present

## 2018-01-19 DIAGNOSIS — R569 Unspecified convulsions: Secondary | ICD-10-CM | POA: Diagnosis not present

## 2018-01-19 DIAGNOSIS — R41841 Cognitive communication deficit: Secondary | ICD-10-CM | POA: Diagnosis not present

## 2018-01-19 DIAGNOSIS — E119 Type 2 diabetes mellitus without complications: Secondary | ICD-10-CM | POA: Diagnosis not present

## 2018-01-19 DIAGNOSIS — G309 Alzheimer's disease, unspecified: Secondary | ICD-10-CM | POA: Diagnosis not present

## 2018-01-20 DIAGNOSIS — R41841 Cognitive communication deficit: Secondary | ICD-10-CM | POA: Diagnosis not present

## 2018-01-20 DIAGNOSIS — R262 Difficulty in walking, not elsewhere classified: Secondary | ICD-10-CM | POA: Diagnosis not present

## 2018-01-20 DIAGNOSIS — D509 Iron deficiency anemia, unspecified: Secondary | ICD-10-CM | POA: Diagnosis not present

## 2018-01-20 DIAGNOSIS — F0390 Unspecified dementia without behavioral disturbance: Secondary | ICD-10-CM | POA: Diagnosis not present

## 2018-01-20 DIAGNOSIS — E119 Type 2 diabetes mellitus without complications: Secondary | ICD-10-CM | POA: Diagnosis not present

## 2018-01-20 DIAGNOSIS — G309 Alzheimer's disease, unspecified: Secondary | ICD-10-CM | POA: Diagnosis not present

## 2018-01-21 DIAGNOSIS — E119 Type 2 diabetes mellitus without complications: Secondary | ICD-10-CM | POA: Diagnosis not present

## 2018-01-21 DIAGNOSIS — R41841 Cognitive communication deficit: Secondary | ICD-10-CM | POA: Diagnosis not present

## 2018-01-21 DIAGNOSIS — D509 Iron deficiency anemia, unspecified: Secondary | ICD-10-CM | POA: Diagnosis not present

## 2018-01-21 DIAGNOSIS — I10 Essential (primary) hypertension: Secondary | ICD-10-CM | POA: Diagnosis not present

## 2018-01-21 DIAGNOSIS — R262 Difficulty in walking, not elsewhere classified: Secondary | ICD-10-CM | POA: Diagnosis not present

## 2018-01-21 DIAGNOSIS — G40802 Other epilepsy, not intractable, without status epilepticus: Secondary | ICD-10-CM | POA: Diagnosis not present

## 2018-01-21 DIAGNOSIS — G309 Alzheimer's disease, unspecified: Secondary | ICD-10-CM | POA: Diagnosis not present

## 2018-01-21 DIAGNOSIS — G301 Alzheimer's disease with late onset: Secondary | ICD-10-CM | POA: Diagnosis not present

## 2018-01-21 DIAGNOSIS — F0390 Unspecified dementia without behavioral disturbance: Secondary | ICD-10-CM | POA: Diagnosis not present

## 2018-01-22 DIAGNOSIS — E119 Type 2 diabetes mellitus without complications: Secondary | ICD-10-CM | POA: Diagnosis not present

## 2018-01-22 DIAGNOSIS — G309 Alzheimer's disease, unspecified: Secondary | ICD-10-CM | POA: Diagnosis not present

## 2018-01-22 DIAGNOSIS — F0390 Unspecified dementia without behavioral disturbance: Secondary | ICD-10-CM | POA: Diagnosis not present

## 2018-01-22 DIAGNOSIS — R262 Difficulty in walking, not elsewhere classified: Secondary | ICD-10-CM | POA: Diagnosis not present

## 2018-01-22 DIAGNOSIS — R41841 Cognitive communication deficit: Secondary | ICD-10-CM | POA: Diagnosis not present

## 2018-01-22 DIAGNOSIS — D509 Iron deficiency anemia, unspecified: Secondary | ICD-10-CM | POA: Diagnosis not present

## 2018-01-25 DIAGNOSIS — R262 Difficulty in walking, not elsewhere classified: Secondary | ICD-10-CM | POA: Diagnosis not present

## 2018-01-25 DIAGNOSIS — G309 Alzheimer's disease, unspecified: Secondary | ICD-10-CM | POA: Diagnosis not present

## 2018-01-25 DIAGNOSIS — I10 Essential (primary) hypertension: Secondary | ICD-10-CM | POA: Diagnosis not present

## 2018-01-25 DIAGNOSIS — F22 Delusional disorders: Secondary | ICD-10-CM | POA: Diagnosis not present

## 2018-01-25 DIAGNOSIS — G40802 Other epilepsy, not intractable, without status epilepticus: Secondary | ICD-10-CM | POA: Diagnosis not present

## 2018-01-25 DIAGNOSIS — D509 Iron deficiency anemia, unspecified: Secondary | ICD-10-CM | POA: Diagnosis not present

## 2018-01-25 DIAGNOSIS — F0390 Unspecified dementia without behavioral disturbance: Secondary | ICD-10-CM | POA: Diagnosis not present

## 2018-01-25 DIAGNOSIS — R41841 Cognitive communication deficit: Secondary | ICD-10-CM | POA: Diagnosis not present

## 2018-01-25 DIAGNOSIS — E119 Type 2 diabetes mellitus without complications: Secondary | ICD-10-CM | POA: Diagnosis not present

## 2018-01-26 DIAGNOSIS — E1100 Type 2 diabetes mellitus with hyperosmolarity without nonketotic hyperglycemic-hyperosmolar coma (NKHHC): Secondary | ICD-10-CM | POA: Diagnosis not present

## 2018-01-26 DIAGNOSIS — F0390 Unspecified dementia without behavioral disturbance: Secondary | ICD-10-CM | POA: Diagnosis not present

## 2018-01-26 DIAGNOSIS — R69 Illness, unspecified: Secondary | ICD-10-CM | POA: Diagnosis not present

## 2018-01-26 DIAGNOSIS — E119 Type 2 diabetes mellitus without complications: Secondary | ICD-10-CM | POA: Diagnosis not present

## 2018-01-26 DIAGNOSIS — D649 Anemia, unspecified: Secondary | ICD-10-CM | POA: Diagnosis not present

## 2018-01-26 DIAGNOSIS — R41841 Cognitive communication deficit: Secondary | ICD-10-CM | POA: Diagnosis not present

## 2018-01-26 DIAGNOSIS — G309 Alzheimer's disease, unspecified: Secondary | ICD-10-CM | POA: Diagnosis not present

## 2018-01-26 DIAGNOSIS — D509 Iron deficiency anemia, unspecified: Secondary | ICD-10-CM | POA: Diagnosis not present

## 2018-01-26 DIAGNOSIS — R262 Difficulty in walking, not elsewhere classified: Secondary | ICD-10-CM | POA: Diagnosis not present

## 2018-01-27 DIAGNOSIS — G40909 Epilepsy, unspecified, not intractable, without status epilepticus: Secondary | ICD-10-CM | POA: Diagnosis not present

## 2018-01-27 DIAGNOSIS — R41841 Cognitive communication deficit: Secondary | ICD-10-CM | POA: Diagnosis not present

## 2018-01-27 DIAGNOSIS — F0390 Unspecified dementia without behavioral disturbance: Secondary | ICD-10-CM | POA: Diagnosis not present

## 2018-01-27 DIAGNOSIS — G309 Alzheimer's disease, unspecified: Secondary | ICD-10-CM | POA: Diagnosis not present

## 2018-01-27 DIAGNOSIS — R262 Difficulty in walking, not elsewhere classified: Secondary | ICD-10-CM | POA: Diagnosis not present

## 2018-01-27 DIAGNOSIS — Z9181 History of falling: Secondary | ICD-10-CM | POA: Diagnosis not present

## 2018-01-27 DIAGNOSIS — D509 Iron deficiency anemia, unspecified: Secondary | ICD-10-CM | POA: Diagnosis not present

## 2018-01-27 DIAGNOSIS — E119 Type 2 diabetes mellitus without complications: Secondary | ICD-10-CM | POA: Diagnosis not present

## 2018-01-28 DIAGNOSIS — R262 Difficulty in walking, not elsewhere classified: Secondary | ICD-10-CM | POA: Diagnosis not present

## 2018-01-28 DIAGNOSIS — E119 Type 2 diabetes mellitus without complications: Secondary | ICD-10-CM | POA: Diagnosis not present

## 2018-01-28 DIAGNOSIS — F0390 Unspecified dementia without behavioral disturbance: Secondary | ICD-10-CM | POA: Diagnosis not present

## 2018-01-28 DIAGNOSIS — D509 Iron deficiency anemia, unspecified: Secondary | ICD-10-CM | POA: Diagnosis not present

## 2018-01-28 DIAGNOSIS — R41841 Cognitive communication deficit: Secondary | ICD-10-CM | POA: Diagnosis not present

## 2018-01-28 DIAGNOSIS — G309 Alzheimer's disease, unspecified: Secondary | ICD-10-CM | POA: Diagnosis not present

## 2018-01-29 DIAGNOSIS — F0390 Unspecified dementia without behavioral disturbance: Secondary | ICD-10-CM | POA: Diagnosis not present

## 2018-01-29 DIAGNOSIS — G309 Alzheimer's disease, unspecified: Secondary | ICD-10-CM | POA: Diagnosis not present

## 2018-01-29 DIAGNOSIS — R262 Difficulty in walking, not elsewhere classified: Secondary | ICD-10-CM | POA: Diagnosis not present

## 2018-01-29 DIAGNOSIS — R41841 Cognitive communication deficit: Secondary | ICD-10-CM | POA: Diagnosis not present

## 2018-01-29 DIAGNOSIS — E119 Type 2 diabetes mellitus without complications: Secondary | ICD-10-CM | POA: Diagnosis not present

## 2018-01-29 DIAGNOSIS — D509 Iron deficiency anemia, unspecified: Secondary | ICD-10-CM | POA: Diagnosis not present

## 2018-01-31 DIAGNOSIS — R262 Difficulty in walking, not elsewhere classified: Secondary | ICD-10-CM | POA: Diagnosis not present

## 2018-01-31 DIAGNOSIS — F0390 Unspecified dementia without behavioral disturbance: Secondary | ICD-10-CM | POA: Diagnosis not present

## 2018-01-31 DIAGNOSIS — D509 Iron deficiency anemia, unspecified: Secondary | ICD-10-CM | POA: Diagnosis not present

## 2018-01-31 DIAGNOSIS — E119 Type 2 diabetes mellitus without complications: Secondary | ICD-10-CM | POA: Diagnosis not present

## 2018-01-31 DIAGNOSIS — G309 Alzheimer's disease, unspecified: Secondary | ICD-10-CM | POA: Diagnosis not present

## 2018-01-31 DIAGNOSIS — R41841 Cognitive communication deficit: Secondary | ICD-10-CM | POA: Diagnosis not present

## 2018-02-01 DIAGNOSIS — F0390 Unspecified dementia without behavioral disturbance: Secondary | ICD-10-CM | POA: Diagnosis not present

## 2018-02-01 DIAGNOSIS — D509 Iron deficiency anemia, unspecified: Secondary | ICD-10-CM | POA: Diagnosis not present

## 2018-02-01 DIAGNOSIS — E119 Type 2 diabetes mellitus without complications: Secondary | ICD-10-CM | POA: Diagnosis not present

## 2018-02-01 DIAGNOSIS — R262 Difficulty in walking, not elsewhere classified: Secondary | ICD-10-CM | POA: Diagnosis not present

## 2018-02-01 DIAGNOSIS — R41841 Cognitive communication deficit: Secondary | ICD-10-CM | POA: Diagnosis not present

## 2018-02-01 DIAGNOSIS — G309 Alzheimer's disease, unspecified: Secondary | ICD-10-CM | POA: Diagnosis not present

## 2018-02-02 DIAGNOSIS — F0390 Unspecified dementia without behavioral disturbance: Secondary | ICD-10-CM | POA: Diagnosis not present

## 2018-02-02 DIAGNOSIS — D509 Iron deficiency anemia, unspecified: Secondary | ICD-10-CM | POA: Diagnosis not present

## 2018-02-02 DIAGNOSIS — R41841 Cognitive communication deficit: Secondary | ICD-10-CM | POA: Diagnosis not present

## 2018-02-02 DIAGNOSIS — G309 Alzheimer's disease, unspecified: Secondary | ICD-10-CM | POA: Diagnosis not present

## 2018-02-02 DIAGNOSIS — E119 Type 2 diabetes mellitus without complications: Secondary | ICD-10-CM | POA: Diagnosis not present

## 2018-02-02 DIAGNOSIS — R262 Difficulty in walking, not elsewhere classified: Secondary | ICD-10-CM | POA: Diagnosis not present

## 2018-02-03 DIAGNOSIS — I10 Essential (primary) hypertension: Secondary | ICD-10-CM | POA: Diagnosis not present

## 2018-02-03 DIAGNOSIS — R41841 Cognitive communication deficit: Secondary | ICD-10-CM | POA: Diagnosis not present

## 2018-02-03 DIAGNOSIS — F0390 Unspecified dementia without behavioral disturbance: Secondary | ICD-10-CM | POA: Diagnosis not present

## 2018-02-03 DIAGNOSIS — G40802 Other epilepsy, not intractable, without status epilepticus: Secondary | ICD-10-CM | POA: Diagnosis not present

## 2018-02-03 DIAGNOSIS — D509 Iron deficiency anemia, unspecified: Secondary | ICD-10-CM | POA: Diagnosis not present

## 2018-02-03 DIAGNOSIS — E119 Type 2 diabetes mellitus without complications: Secondary | ICD-10-CM | POA: Diagnosis not present

## 2018-02-03 DIAGNOSIS — R262 Difficulty in walking, not elsewhere classified: Secondary | ICD-10-CM | POA: Diagnosis not present

## 2018-02-03 DIAGNOSIS — G309 Alzheimer's disease, unspecified: Secondary | ICD-10-CM | POA: Diagnosis not present

## 2018-02-03 DIAGNOSIS — F22 Delusional disorders: Secondary | ICD-10-CM | POA: Diagnosis not present

## 2018-02-04 DIAGNOSIS — R262 Difficulty in walking, not elsewhere classified: Secondary | ICD-10-CM | POA: Diagnosis not present

## 2018-02-04 DIAGNOSIS — D509 Iron deficiency anemia, unspecified: Secondary | ICD-10-CM | POA: Diagnosis not present

## 2018-02-04 DIAGNOSIS — G309 Alzheimer's disease, unspecified: Secondary | ICD-10-CM | POA: Diagnosis not present

## 2018-02-04 DIAGNOSIS — F0390 Unspecified dementia without behavioral disturbance: Secondary | ICD-10-CM | POA: Diagnosis not present

## 2018-02-04 DIAGNOSIS — R41841 Cognitive communication deficit: Secondary | ICD-10-CM | POA: Diagnosis not present

## 2018-02-04 DIAGNOSIS — E119 Type 2 diabetes mellitus without complications: Secondary | ICD-10-CM | POA: Diagnosis not present

## 2018-02-05 DIAGNOSIS — D509 Iron deficiency anemia, unspecified: Secondary | ICD-10-CM | POA: Diagnosis not present

## 2018-02-05 DIAGNOSIS — F0390 Unspecified dementia without behavioral disturbance: Secondary | ICD-10-CM | POA: Diagnosis not present

## 2018-02-05 DIAGNOSIS — E119 Type 2 diabetes mellitus without complications: Secondary | ICD-10-CM | POA: Diagnosis not present

## 2018-02-05 DIAGNOSIS — G309 Alzheimer's disease, unspecified: Secondary | ICD-10-CM | POA: Diagnosis not present

## 2018-02-05 DIAGNOSIS — R41841 Cognitive communication deficit: Secondary | ICD-10-CM | POA: Diagnosis not present

## 2018-02-05 DIAGNOSIS — R262 Difficulty in walking, not elsewhere classified: Secondary | ICD-10-CM | POA: Diagnosis not present

## 2018-02-06 DIAGNOSIS — G309 Alzheimer's disease, unspecified: Secondary | ICD-10-CM | POA: Diagnosis not present

## 2018-02-06 DIAGNOSIS — R41841 Cognitive communication deficit: Secondary | ICD-10-CM | POA: Diagnosis not present

## 2018-02-06 DIAGNOSIS — E119 Type 2 diabetes mellitus without complications: Secondary | ICD-10-CM | POA: Diagnosis not present

## 2018-02-06 DIAGNOSIS — R262 Difficulty in walking, not elsewhere classified: Secondary | ICD-10-CM | POA: Diagnosis not present

## 2018-02-06 DIAGNOSIS — F0390 Unspecified dementia without behavioral disturbance: Secondary | ICD-10-CM | POA: Diagnosis not present

## 2018-02-06 DIAGNOSIS — D509 Iron deficiency anemia, unspecified: Secondary | ICD-10-CM | POA: Diagnosis not present

## 2018-02-08 DIAGNOSIS — R41841 Cognitive communication deficit: Secondary | ICD-10-CM | POA: Diagnosis not present

## 2018-02-08 DIAGNOSIS — E119 Type 2 diabetes mellitus without complications: Secondary | ICD-10-CM | POA: Diagnosis not present

## 2018-02-08 DIAGNOSIS — F0390 Unspecified dementia without behavioral disturbance: Secondary | ICD-10-CM | POA: Diagnosis not present

## 2018-02-08 DIAGNOSIS — G301 Alzheimer's disease with late onset: Secondary | ICD-10-CM | POA: Diagnosis not present

## 2018-02-08 DIAGNOSIS — R262 Difficulty in walking, not elsewhere classified: Secondary | ICD-10-CM | POA: Diagnosis not present

## 2018-02-08 DIAGNOSIS — G309 Alzheimer's disease, unspecified: Secondary | ICD-10-CM | POA: Diagnosis not present

## 2018-02-08 DIAGNOSIS — D509 Iron deficiency anemia, unspecified: Secondary | ICD-10-CM | POA: Diagnosis not present

## 2018-02-08 DIAGNOSIS — I10 Essential (primary) hypertension: Secondary | ICD-10-CM | POA: Diagnosis not present

## 2018-02-08 DIAGNOSIS — G40802 Other epilepsy, not intractable, without status epilepticus: Secondary | ICD-10-CM | POA: Diagnosis not present

## 2018-02-09 DIAGNOSIS — G309 Alzheimer's disease, unspecified: Secondary | ICD-10-CM | POA: Diagnosis not present

## 2018-02-09 DIAGNOSIS — E119 Type 2 diabetes mellitus without complications: Secondary | ICD-10-CM | POA: Diagnosis not present

## 2018-02-09 DIAGNOSIS — R41841 Cognitive communication deficit: Secondary | ICD-10-CM | POA: Diagnosis not present

## 2018-02-09 DIAGNOSIS — F0390 Unspecified dementia without behavioral disturbance: Secondary | ICD-10-CM | POA: Diagnosis not present

## 2018-02-09 DIAGNOSIS — D509 Iron deficiency anemia, unspecified: Secondary | ICD-10-CM | POA: Diagnosis not present

## 2018-02-09 DIAGNOSIS — R262 Difficulty in walking, not elsewhere classified: Secondary | ICD-10-CM | POA: Diagnosis not present

## 2018-02-10 DIAGNOSIS — F0390 Unspecified dementia without behavioral disturbance: Secondary | ICD-10-CM | POA: Diagnosis not present

## 2018-02-10 DIAGNOSIS — E119 Type 2 diabetes mellitus without complications: Secondary | ICD-10-CM | POA: Diagnosis not present

## 2018-02-10 DIAGNOSIS — R262 Difficulty in walking, not elsewhere classified: Secondary | ICD-10-CM | POA: Diagnosis not present

## 2018-02-10 DIAGNOSIS — R41841 Cognitive communication deficit: Secondary | ICD-10-CM | POA: Diagnosis not present

## 2018-02-10 DIAGNOSIS — G309 Alzheimer's disease, unspecified: Secondary | ICD-10-CM | POA: Diagnosis not present

## 2018-02-10 DIAGNOSIS — D509 Iron deficiency anemia, unspecified: Secondary | ICD-10-CM | POA: Diagnosis not present

## 2018-02-11 DIAGNOSIS — D509 Iron deficiency anemia, unspecified: Secondary | ICD-10-CM | POA: Diagnosis not present

## 2018-02-11 DIAGNOSIS — E119 Type 2 diabetes mellitus without complications: Secondary | ICD-10-CM | POA: Diagnosis not present

## 2018-02-11 DIAGNOSIS — R262 Difficulty in walking, not elsewhere classified: Secondary | ICD-10-CM | POA: Diagnosis not present

## 2018-02-11 DIAGNOSIS — R41841 Cognitive communication deficit: Secondary | ICD-10-CM | POA: Diagnosis not present

## 2018-02-11 DIAGNOSIS — G309 Alzheimer's disease, unspecified: Secondary | ICD-10-CM | POA: Diagnosis not present

## 2018-02-11 DIAGNOSIS — F0390 Unspecified dementia without behavioral disturbance: Secondary | ICD-10-CM | POA: Diagnosis not present

## 2018-02-12 DIAGNOSIS — F0390 Unspecified dementia without behavioral disturbance: Secondary | ICD-10-CM | POA: Diagnosis not present

## 2018-02-12 DIAGNOSIS — D509 Iron deficiency anemia, unspecified: Secondary | ICD-10-CM | POA: Diagnosis not present

## 2018-02-12 DIAGNOSIS — R41841 Cognitive communication deficit: Secondary | ICD-10-CM | POA: Diagnosis not present

## 2018-02-12 DIAGNOSIS — G309 Alzheimer's disease, unspecified: Secondary | ICD-10-CM | POA: Diagnosis not present

## 2018-02-12 DIAGNOSIS — E119 Type 2 diabetes mellitus without complications: Secondary | ICD-10-CM | POA: Diagnosis not present

## 2018-02-12 DIAGNOSIS — R262 Difficulty in walking, not elsewhere classified: Secondary | ICD-10-CM | POA: Diagnosis not present

## 2018-02-14 DIAGNOSIS — R262 Difficulty in walking, not elsewhere classified: Secondary | ICD-10-CM | POA: Diagnosis not present

## 2018-02-14 DIAGNOSIS — R41841 Cognitive communication deficit: Secondary | ICD-10-CM | POA: Diagnosis not present

## 2018-02-14 DIAGNOSIS — G309 Alzheimer's disease, unspecified: Secondary | ICD-10-CM | POA: Diagnosis not present

## 2018-02-14 DIAGNOSIS — F0390 Unspecified dementia without behavioral disturbance: Secondary | ICD-10-CM | POA: Diagnosis not present

## 2018-02-14 DIAGNOSIS — E119 Type 2 diabetes mellitus without complications: Secondary | ICD-10-CM | POA: Diagnosis not present

## 2018-02-14 DIAGNOSIS — D509 Iron deficiency anemia, unspecified: Secondary | ICD-10-CM | POA: Diagnosis not present

## 2018-02-15 DIAGNOSIS — E119 Type 2 diabetes mellitus without complications: Secondary | ICD-10-CM | POA: Diagnosis not present

## 2018-02-15 DIAGNOSIS — F0390 Unspecified dementia without behavioral disturbance: Secondary | ICD-10-CM | POA: Diagnosis not present

## 2018-02-15 DIAGNOSIS — R41841 Cognitive communication deficit: Secondary | ICD-10-CM | POA: Diagnosis not present

## 2018-02-15 DIAGNOSIS — R262 Difficulty in walking, not elsewhere classified: Secondary | ICD-10-CM | POA: Diagnosis not present

## 2018-02-15 DIAGNOSIS — D509 Iron deficiency anemia, unspecified: Secondary | ICD-10-CM | POA: Diagnosis not present

## 2018-02-15 DIAGNOSIS — G309 Alzheimer's disease, unspecified: Secondary | ICD-10-CM | POA: Diagnosis not present

## 2018-02-15 DIAGNOSIS — I10 Essential (primary) hypertension: Secondary | ICD-10-CM | POA: Diagnosis not present

## 2018-02-15 DIAGNOSIS — F22 Delusional disorders: Secondary | ICD-10-CM | POA: Diagnosis not present

## 2018-02-15 DIAGNOSIS — G40802 Other epilepsy, not intractable, without status epilepticus: Secondary | ICD-10-CM | POA: Diagnosis not present

## 2018-02-16 DIAGNOSIS — G309 Alzheimer's disease, unspecified: Secondary | ICD-10-CM | POA: Diagnosis not present

## 2018-02-16 DIAGNOSIS — F0390 Unspecified dementia without behavioral disturbance: Secondary | ICD-10-CM | POA: Diagnosis not present

## 2018-02-16 DIAGNOSIS — R262 Difficulty in walking, not elsewhere classified: Secondary | ICD-10-CM | POA: Diagnosis not present

## 2018-02-16 DIAGNOSIS — D509 Iron deficiency anemia, unspecified: Secondary | ICD-10-CM | POA: Diagnosis not present

## 2018-02-16 DIAGNOSIS — I10 Essential (primary) hypertension: Secondary | ICD-10-CM | POA: Diagnosis not present

## 2018-02-16 DIAGNOSIS — L309 Dermatitis, unspecified: Secondary | ICD-10-CM | POA: Diagnosis not present

## 2018-02-16 DIAGNOSIS — R41841 Cognitive communication deficit: Secondary | ICD-10-CM | POA: Diagnosis not present

## 2018-02-16 DIAGNOSIS — G301 Alzheimer's disease with late onset: Secondary | ICD-10-CM | POA: Diagnosis not present

## 2018-02-16 DIAGNOSIS — E119 Type 2 diabetes mellitus without complications: Secondary | ICD-10-CM | POA: Diagnosis not present

## 2018-02-18 DIAGNOSIS — D509 Iron deficiency anemia, unspecified: Secondary | ICD-10-CM | POA: Diagnosis not present

## 2018-02-18 DIAGNOSIS — R41841 Cognitive communication deficit: Secondary | ICD-10-CM | POA: Diagnosis not present

## 2018-02-18 DIAGNOSIS — R262 Difficulty in walking, not elsewhere classified: Secondary | ICD-10-CM | POA: Diagnosis not present

## 2018-02-18 DIAGNOSIS — F0390 Unspecified dementia without behavioral disturbance: Secondary | ICD-10-CM | POA: Diagnosis not present

## 2018-02-18 DIAGNOSIS — G309 Alzheimer's disease, unspecified: Secondary | ICD-10-CM | POA: Diagnosis not present

## 2018-02-18 DIAGNOSIS — E119 Type 2 diabetes mellitus without complications: Secondary | ICD-10-CM | POA: Diagnosis not present

## 2018-02-19 DIAGNOSIS — R41841 Cognitive communication deficit: Secondary | ICD-10-CM | POA: Diagnosis not present

## 2018-02-19 DIAGNOSIS — G309 Alzheimer's disease, unspecified: Secondary | ICD-10-CM | POA: Diagnosis not present

## 2018-02-19 DIAGNOSIS — D509 Iron deficiency anemia, unspecified: Secondary | ICD-10-CM | POA: Diagnosis not present

## 2018-02-19 DIAGNOSIS — E119 Type 2 diabetes mellitus without complications: Secondary | ICD-10-CM | POA: Diagnosis not present

## 2018-02-19 DIAGNOSIS — R262 Difficulty in walking, not elsewhere classified: Secondary | ICD-10-CM | POA: Diagnosis not present

## 2018-02-19 DIAGNOSIS — F0390 Unspecified dementia without behavioral disturbance: Secondary | ICD-10-CM | POA: Diagnosis not present

## 2018-02-22 DIAGNOSIS — G309 Alzheimer's disease, unspecified: Secondary | ICD-10-CM | POA: Diagnosis not present

## 2018-02-22 DIAGNOSIS — D509 Iron deficiency anemia, unspecified: Secondary | ICD-10-CM | POA: Diagnosis not present

## 2018-02-22 DIAGNOSIS — R41841 Cognitive communication deficit: Secondary | ICD-10-CM | POA: Diagnosis not present

## 2018-02-22 DIAGNOSIS — F0390 Unspecified dementia without behavioral disturbance: Secondary | ICD-10-CM | POA: Diagnosis not present

## 2018-02-22 DIAGNOSIS — E119 Type 2 diabetes mellitus without complications: Secondary | ICD-10-CM | POA: Diagnosis not present

## 2018-02-22 DIAGNOSIS — R262 Difficulty in walking, not elsewhere classified: Secondary | ICD-10-CM | POA: Diagnosis not present

## 2018-02-23 DIAGNOSIS — E119 Type 2 diabetes mellitus without complications: Secondary | ICD-10-CM | POA: Diagnosis not present

## 2018-02-23 DIAGNOSIS — F0390 Unspecified dementia without behavioral disturbance: Secondary | ICD-10-CM | POA: Diagnosis not present

## 2018-02-23 DIAGNOSIS — D509 Iron deficiency anemia, unspecified: Secondary | ICD-10-CM | POA: Diagnosis not present

## 2018-02-23 DIAGNOSIS — R41841 Cognitive communication deficit: Secondary | ICD-10-CM | POA: Diagnosis not present

## 2018-02-23 DIAGNOSIS — R262 Difficulty in walking, not elsewhere classified: Secondary | ICD-10-CM | POA: Diagnosis not present

## 2018-02-23 DIAGNOSIS — G309 Alzheimer's disease, unspecified: Secondary | ICD-10-CM | POA: Diagnosis not present

## 2018-02-24 DIAGNOSIS — G309 Alzheimer's disease, unspecified: Secondary | ICD-10-CM | POA: Diagnosis not present

## 2018-02-24 DIAGNOSIS — D509 Iron deficiency anemia, unspecified: Secondary | ICD-10-CM | POA: Diagnosis not present

## 2018-02-24 DIAGNOSIS — E119 Type 2 diabetes mellitus without complications: Secondary | ICD-10-CM | POA: Diagnosis not present

## 2018-02-24 DIAGNOSIS — R262 Difficulty in walking, not elsewhere classified: Secondary | ICD-10-CM | POA: Diagnosis not present

## 2018-02-24 DIAGNOSIS — F0390 Unspecified dementia without behavioral disturbance: Secondary | ICD-10-CM | POA: Diagnosis not present

## 2018-02-24 DIAGNOSIS — R41841 Cognitive communication deficit: Secondary | ICD-10-CM | POA: Diagnosis not present

## 2018-02-25 DIAGNOSIS — E119 Type 2 diabetes mellitus without complications: Secondary | ICD-10-CM | POA: Diagnosis not present

## 2018-02-25 DIAGNOSIS — R262 Difficulty in walking, not elsewhere classified: Secondary | ICD-10-CM | POA: Diagnosis not present

## 2018-02-25 DIAGNOSIS — R41841 Cognitive communication deficit: Secondary | ICD-10-CM | POA: Diagnosis not present

## 2018-02-25 DIAGNOSIS — G309 Alzheimer's disease, unspecified: Secondary | ICD-10-CM | POA: Diagnosis not present

## 2018-02-25 DIAGNOSIS — F0390 Unspecified dementia without behavioral disturbance: Secondary | ICD-10-CM | POA: Diagnosis not present

## 2018-02-25 DIAGNOSIS — D509 Iron deficiency anemia, unspecified: Secondary | ICD-10-CM | POA: Diagnosis not present

## 2018-02-26 DIAGNOSIS — R262 Difficulty in walking, not elsewhere classified: Secondary | ICD-10-CM | POA: Diagnosis not present

## 2018-02-26 DIAGNOSIS — D509 Iron deficiency anemia, unspecified: Secondary | ICD-10-CM | POA: Diagnosis not present

## 2018-02-26 DIAGNOSIS — E119 Type 2 diabetes mellitus without complications: Secondary | ICD-10-CM | POA: Diagnosis not present

## 2018-02-26 DIAGNOSIS — R41841 Cognitive communication deficit: Secondary | ICD-10-CM | POA: Diagnosis not present

## 2018-02-26 DIAGNOSIS — G309 Alzheimer's disease, unspecified: Secondary | ICD-10-CM | POA: Diagnosis not present

## 2018-02-26 DIAGNOSIS — F0390 Unspecified dementia without behavioral disturbance: Secondary | ICD-10-CM | POA: Diagnosis not present

## 2018-02-28 DIAGNOSIS — G309 Alzheimer's disease, unspecified: Secondary | ICD-10-CM | POA: Diagnosis not present

## 2018-02-28 DIAGNOSIS — D509 Iron deficiency anemia, unspecified: Secondary | ICD-10-CM | POA: Diagnosis not present

## 2018-02-28 DIAGNOSIS — F0390 Unspecified dementia without behavioral disturbance: Secondary | ICD-10-CM | POA: Diagnosis not present

## 2018-02-28 DIAGNOSIS — G40909 Epilepsy, unspecified, not intractable, without status epilepticus: Secondary | ICD-10-CM | POA: Diagnosis not present

## 2018-02-28 DIAGNOSIS — R262 Difficulty in walking, not elsewhere classified: Secondary | ICD-10-CM | POA: Diagnosis not present

## 2018-02-28 DIAGNOSIS — E119 Type 2 diabetes mellitus without complications: Secondary | ICD-10-CM | POA: Diagnosis not present

## 2018-02-28 DIAGNOSIS — R41841 Cognitive communication deficit: Secondary | ICD-10-CM | POA: Diagnosis not present

## 2018-02-28 DIAGNOSIS — Z9181 History of falling: Secondary | ICD-10-CM | POA: Diagnosis not present

## 2018-03-01 DIAGNOSIS — R262 Difficulty in walking, not elsewhere classified: Secondary | ICD-10-CM | POA: Diagnosis not present

## 2018-03-01 DIAGNOSIS — R41841 Cognitive communication deficit: Secondary | ICD-10-CM | POA: Diagnosis not present

## 2018-03-01 DIAGNOSIS — G309 Alzheimer's disease, unspecified: Secondary | ICD-10-CM | POA: Diagnosis not present

## 2018-03-01 DIAGNOSIS — D509 Iron deficiency anemia, unspecified: Secondary | ICD-10-CM | POA: Diagnosis not present

## 2018-03-01 DIAGNOSIS — E119 Type 2 diabetes mellitus without complications: Secondary | ICD-10-CM | POA: Diagnosis not present

## 2018-03-01 DIAGNOSIS — F0390 Unspecified dementia without behavioral disturbance: Secondary | ICD-10-CM | POA: Diagnosis not present

## 2018-03-02 DIAGNOSIS — R41841 Cognitive communication deficit: Secondary | ICD-10-CM | POA: Diagnosis not present

## 2018-03-02 DIAGNOSIS — E119 Type 2 diabetes mellitus without complications: Secondary | ICD-10-CM | POA: Diagnosis not present

## 2018-03-02 DIAGNOSIS — R262 Difficulty in walking, not elsewhere classified: Secondary | ICD-10-CM | POA: Diagnosis not present

## 2018-03-02 DIAGNOSIS — D509 Iron deficiency anemia, unspecified: Secondary | ICD-10-CM | POA: Diagnosis not present

## 2018-03-02 DIAGNOSIS — F0390 Unspecified dementia without behavioral disturbance: Secondary | ICD-10-CM | POA: Diagnosis not present

## 2018-03-02 DIAGNOSIS — G309 Alzheimer's disease, unspecified: Secondary | ICD-10-CM | POA: Diagnosis not present

## 2018-03-03 DIAGNOSIS — R41841 Cognitive communication deficit: Secondary | ICD-10-CM | POA: Diagnosis not present

## 2018-03-03 DIAGNOSIS — F0390 Unspecified dementia without behavioral disturbance: Secondary | ICD-10-CM | POA: Diagnosis not present

## 2018-03-03 DIAGNOSIS — R262 Difficulty in walking, not elsewhere classified: Secondary | ICD-10-CM | POA: Diagnosis not present

## 2018-03-03 DIAGNOSIS — E119 Type 2 diabetes mellitus without complications: Secondary | ICD-10-CM | POA: Diagnosis not present

## 2018-03-03 DIAGNOSIS — D509 Iron deficiency anemia, unspecified: Secondary | ICD-10-CM | POA: Diagnosis not present

## 2018-03-03 DIAGNOSIS — G309 Alzheimer's disease, unspecified: Secondary | ICD-10-CM | POA: Diagnosis not present

## 2018-03-04 DIAGNOSIS — F0390 Unspecified dementia without behavioral disturbance: Secondary | ICD-10-CM | POA: Diagnosis not present

## 2018-03-04 DIAGNOSIS — R262 Difficulty in walking, not elsewhere classified: Secondary | ICD-10-CM | POA: Diagnosis not present

## 2018-03-04 DIAGNOSIS — D509 Iron deficiency anemia, unspecified: Secondary | ICD-10-CM | POA: Diagnosis not present

## 2018-03-04 DIAGNOSIS — G309 Alzheimer's disease, unspecified: Secondary | ICD-10-CM | POA: Diagnosis not present

## 2018-03-04 DIAGNOSIS — E119 Type 2 diabetes mellitus without complications: Secondary | ICD-10-CM | POA: Diagnosis not present

## 2018-03-04 DIAGNOSIS — R41841 Cognitive communication deficit: Secondary | ICD-10-CM | POA: Diagnosis not present

## 2018-03-05 DIAGNOSIS — R262 Difficulty in walking, not elsewhere classified: Secondary | ICD-10-CM | POA: Diagnosis not present

## 2018-03-05 DIAGNOSIS — R41841 Cognitive communication deficit: Secondary | ICD-10-CM | POA: Diagnosis not present

## 2018-03-05 DIAGNOSIS — D509 Iron deficiency anemia, unspecified: Secondary | ICD-10-CM | POA: Diagnosis not present

## 2018-03-05 DIAGNOSIS — F0390 Unspecified dementia without behavioral disturbance: Secondary | ICD-10-CM | POA: Diagnosis not present

## 2018-03-05 DIAGNOSIS — G309 Alzheimer's disease, unspecified: Secondary | ICD-10-CM | POA: Diagnosis not present

## 2018-03-05 DIAGNOSIS — E119 Type 2 diabetes mellitus without complications: Secondary | ICD-10-CM | POA: Diagnosis not present

## 2018-03-07 DIAGNOSIS — D509 Iron deficiency anemia, unspecified: Secondary | ICD-10-CM | POA: Diagnosis not present

## 2018-03-07 DIAGNOSIS — F0390 Unspecified dementia without behavioral disturbance: Secondary | ICD-10-CM | POA: Diagnosis not present

## 2018-03-07 DIAGNOSIS — R262 Difficulty in walking, not elsewhere classified: Secondary | ICD-10-CM | POA: Diagnosis not present

## 2018-03-07 DIAGNOSIS — E119 Type 2 diabetes mellitus without complications: Secondary | ICD-10-CM | POA: Diagnosis not present

## 2018-03-07 DIAGNOSIS — G309 Alzheimer's disease, unspecified: Secondary | ICD-10-CM | POA: Diagnosis not present

## 2018-03-07 DIAGNOSIS — R41841 Cognitive communication deficit: Secondary | ICD-10-CM | POA: Diagnosis not present

## 2018-03-08 DIAGNOSIS — R41841 Cognitive communication deficit: Secondary | ICD-10-CM | POA: Diagnosis not present

## 2018-03-08 DIAGNOSIS — R2681 Unsteadiness on feet: Secondary | ICD-10-CM | POA: Diagnosis not present

## 2018-03-08 DIAGNOSIS — F0390 Unspecified dementia without behavioral disturbance: Secondary | ICD-10-CM | POA: Diagnosis not present

## 2018-03-08 DIAGNOSIS — G309 Alzheimer's disease, unspecified: Secondary | ICD-10-CM | POA: Diagnosis not present

## 2018-03-08 DIAGNOSIS — E119 Type 2 diabetes mellitus without complications: Secondary | ICD-10-CM | POA: Diagnosis not present

## 2018-03-08 DIAGNOSIS — M1611 Unilateral primary osteoarthritis, right hip: Secondary | ICD-10-CM | POA: Diagnosis not present

## 2018-03-08 DIAGNOSIS — R262 Difficulty in walking, not elsewhere classified: Secondary | ICD-10-CM | POA: Diagnosis not present

## 2018-03-08 DIAGNOSIS — D509 Iron deficiency anemia, unspecified: Secondary | ICD-10-CM | POA: Diagnosis not present

## 2018-03-09 DIAGNOSIS — G309 Alzheimer's disease, unspecified: Secondary | ICD-10-CM | POA: Diagnosis not present

## 2018-03-09 DIAGNOSIS — F0390 Unspecified dementia without behavioral disturbance: Secondary | ICD-10-CM | POA: Diagnosis not present

## 2018-03-09 DIAGNOSIS — D509 Iron deficiency anemia, unspecified: Secondary | ICD-10-CM | POA: Diagnosis not present

## 2018-03-09 DIAGNOSIS — R41841 Cognitive communication deficit: Secondary | ICD-10-CM | POA: Diagnosis not present

## 2018-03-09 DIAGNOSIS — E119 Type 2 diabetes mellitus without complications: Secondary | ICD-10-CM | POA: Diagnosis not present

## 2018-03-09 DIAGNOSIS — R262 Difficulty in walking, not elsewhere classified: Secondary | ICD-10-CM | POA: Diagnosis not present

## 2018-03-10 DIAGNOSIS — E119 Type 2 diabetes mellitus without complications: Secondary | ICD-10-CM | POA: Diagnosis not present

## 2018-03-10 DIAGNOSIS — F0390 Unspecified dementia without behavioral disturbance: Secondary | ICD-10-CM | POA: Diagnosis not present

## 2018-03-10 DIAGNOSIS — R2681 Unsteadiness on feet: Secondary | ICD-10-CM | POA: Diagnosis not present

## 2018-03-10 DIAGNOSIS — M1611 Unilateral primary osteoarthritis, right hip: Secondary | ICD-10-CM | POA: Diagnosis not present

## 2018-03-10 DIAGNOSIS — G309 Alzheimer's disease, unspecified: Secondary | ICD-10-CM | POA: Diagnosis not present

## 2018-03-10 DIAGNOSIS — D509 Iron deficiency anemia, unspecified: Secondary | ICD-10-CM | POA: Diagnosis not present

## 2018-03-10 DIAGNOSIS — R41841 Cognitive communication deficit: Secondary | ICD-10-CM | POA: Diagnosis not present

## 2018-03-10 DIAGNOSIS — R262 Difficulty in walking, not elsewhere classified: Secondary | ICD-10-CM | POA: Diagnosis not present

## 2018-03-11 DIAGNOSIS — D509 Iron deficiency anemia, unspecified: Secondary | ICD-10-CM | POA: Diagnosis not present

## 2018-03-11 DIAGNOSIS — F0390 Unspecified dementia without behavioral disturbance: Secondary | ICD-10-CM | POA: Diagnosis not present

## 2018-03-11 DIAGNOSIS — R41841 Cognitive communication deficit: Secondary | ICD-10-CM | POA: Diagnosis not present

## 2018-03-11 DIAGNOSIS — E119 Type 2 diabetes mellitus without complications: Secondary | ICD-10-CM | POA: Diagnosis not present

## 2018-03-11 DIAGNOSIS — G309 Alzheimer's disease, unspecified: Secondary | ICD-10-CM | POA: Diagnosis not present

## 2018-03-11 DIAGNOSIS — R262 Difficulty in walking, not elsewhere classified: Secondary | ICD-10-CM | POA: Diagnosis not present

## 2018-03-12 DIAGNOSIS — R41841 Cognitive communication deficit: Secondary | ICD-10-CM | POA: Diagnosis not present

## 2018-03-12 DIAGNOSIS — F0390 Unspecified dementia without behavioral disturbance: Secondary | ICD-10-CM | POA: Diagnosis not present

## 2018-03-12 DIAGNOSIS — R262 Difficulty in walking, not elsewhere classified: Secondary | ICD-10-CM | POA: Diagnosis not present

## 2018-03-12 DIAGNOSIS — D509 Iron deficiency anemia, unspecified: Secondary | ICD-10-CM | POA: Diagnosis not present

## 2018-03-12 DIAGNOSIS — E119 Type 2 diabetes mellitus without complications: Secondary | ICD-10-CM | POA: Diagnosis not present

## 2018-03-12 DIAGNOSIS — G309 Alzheimer's disease, unspecified: Secondary | ICD-10-CM | POA: Diagnosis not present

## 2018-03-15 DIAGNOSIS — G40802 Other epilepsy, not intractable, without status epilepticus: Secondary | ICD-10-CM | POA: Diagnosis not present

## 2018-03-15 DIAGNOSIS — E119 Type 2 diabetes mellitus without complications: Secondary | ICD-10-CM | POA: Diagnosis not present

## 2018-03-15 DIAGNOSIS — I10 Essential (primary) hypertension: Secondary | ICD-10-CM | POA: Diagnosis not present

## 2018-03-15 DIAGNOSIS — E875 Hyperkalemia: Secondary | ICD-10-CM | POA: Diagnosis not present

## 2018-03-16 DIAGNOSIS — E785 Hyperlipidemia, unspecified: Secondary | ICD-10-CM | POA: Diagnosis not present

## 2018-03-16 DIAGNOSIS — D649 Anemia, unspecified: Secondary | ICD-10-CM | POA: Diagnosis not present

## 2018-03-16 DIAGNOSIS — I10 Essential (primary) hypertension: Secondary | ICD-10-CM | POA: Diagnosis not present

## 2018-03-16 DIAGNOSIS — E119 Type 2 diabetes mellitus without complications: Secondary | ICD-10-CM | POA: Diagnosis not present

## 2018-03-18 DIAGNOSIS — E119 Type 2 diabetes mellitus without complications: Secondary | ICD-10-CM | POA: Diagnosis not present

## 2018-03-18 DIAGNOSIS — G301 Alzheimer's disease with late onset: Secondary | ICD-10-CM | POA: Diagnosis not present

## 2018-03-18 DIAGNOSIS — L309 Dermatitis, unspecified: Secondary | ICD-10-CM | POA: Diagnosis not present

## 2018-03-18 DIAGNOSIS — I10 Essential (primary) hypertension: Secondary | ICD-10-CM | POA: Diagnosis not present

## 2018-04-06 DIAGNOSIS — M25551 Pain in right hip: Secondary | ICD-10-CM | POA: Diagnosis not present

## 2018-04-06 DIAGNOSIS — M1611 Unilateral primary osteoarthritis, right hip: Secondary | ICD-10-CM | POA: Diagnosis not present

## 2018-04-12 DIAGNOSIS — E119 Type 2 diabetes mellitus without complications: Secondary | ICD-10-CM | POA: Diagnosis not present

## 2018-04-12 DIAGNOSIS — N183 Chronic kidney disease, stage 3 (moderate): Secondary | ICD-10-CM | POA: Diagnosis not present

## 2018-04-12 DIAGNOSIS — I10 Essential (primary) hypertension: Secondary | ICD-10-CM | POA: Diagnosis not present

## 2018-04-12 DIAGNOSIS — G40802 Other epilepsy, not intractable, without status epilepticus: Secondary | ICD-10-CM | POA: Diagnosis not present

## 2018-04-15 DIAGNOSIS — E119 Type 2 diabetes mellitus without complications: Secondary | ICD-10-CM | POA: Diagnosis not present

## 2018-04-15 DIAGNOSIS — F22 Delusional disorders: Secondary | ICD-10-CM | POA: Diagnosis not present

## 2018-04-15 DIAGNOSIS — N183 Chronic kidney disease, stage 3 (moderate): Secondary | ICD-10-CM | POA: Diagnosis not present

## 2018-04-15 DIAGNOSIS — F0281 Dementia in other diseases classified elsewhere with behavioral disturbance: Secondary | ICD-10-CM | POA: Diagnosis not present

## 2018-05-03 DIAGNOSIS — M25551 Pain in right hip: Secondary | ICD-10-CM | POA: Diagnosis not present

## 2018-05-03 DIAGNOSIS — M1611 Unilateral primary osteoarthritis, right hip: Secondary | ICD-10-CM | POA: Diagnosis not present

## 2018-05-10 DIAGNOSIS — N183 Chronic kidney disease, stage 3 (moderate): Secondary | ICD-10-CM | POA: Diagnosis not present

## 2018-05-10 DIAGNOSIS — I10 Essential (primary) hypertension: Secondary | ICD-10-CM | POA: Diagnosis not present

## 2018-05-10 DIAGNOSIS — G40802 Other epilepsy, not intractable, without status epilepticus: Secondary | ICD-10-CM | POA: Diagnosis not present

## 2018-05-10 DIAGNOSIS — E119 Type 2 diabetes mellitus without complications: Secondary | ICD-10-CM | POA: Diagnosis not present

## 2018-05-11 DIAGNOSIS — Z79899 Other long term (current) drug therapy: Secondary | ICD-10-CM | POA: Diagnosis not present

## 2018-05-11 DIAGNOSIS — R569 Unspecified convulsions: Secondary | ICD-10-CM | POA: Diagnosis not present

## 2018-05-11 DIAGNOSIS — D649 Anemia, unspecified: Secondary | ICD-10-CM | POA: Diagnosis not present

## 2018-05-13 DIAGNOSIS — R4689 Other symptoms and signs involving appearance and behavior: Secondary | ICD-10-CM | POA: Diagnosis not present

## 2018-05-13 DIAGNOSIS — F0281 Dementia in other diseases classified elsewhere with behavioral disturbance: Secondary | ICD-10-CM | POA: Diagnosis not present

## 2018-05-13 DIAGNOSIS — I10 Essential (primary) hypertension: Secondary | ICD-10-CM | POA: Diagnosis not present

## 2018-05-13 DIAGNOSIS — E119 Type 2 diabetes mellitus without complications: Secondary | ICD-10-CM | POA: Diagnosis not present

## 2018-05-28 DIAGNOSIS — F411 Generalized anxiety disorder: Secondary | ICD-10-CM | POA: Diagnosis not present

## 2018-05-28 DIAGNOSIS — F0281 Dementia in other diseases classified elsewhere with behavioral disturbance: Secondary | ICD-10-CM | POA: Diagnosis not present

## 2018-05-28 DIAGNOSIS — F331 Major depressive disorder, recurrent, moderate: Secondary | ICD-10-CM | POA: Diagnosis not present

## 2018-06-01 DIAGNOSIS — F333 Major depressive disorder, recurrent, severe with psychotic symptoms: Secondary | ICD-10-CM | POA: Diagnosis not present

## 2018-06-04 ENCOUNTER — Other Ambulatory Visit: Payer: Self-pay

## 2018-06-04 ENCOUNTER — Emergency Department (HOSPITAL_COMMUNITY)
Admission: EM | Admit: 2018-06-04 | Discharge: 2018-06-05 | Disposition: A | Discharge status: Home / Self Care | Payer: Medicare Other | Attending: Emergency Medicine | Admitting: Emergency Medicine

## 2018-06-04 ENCOUNTER — Encounter (HOSPITAL_COMMUNITY): Payer: Self-pay

## 2018-06-04 DIAGNOSIS — E1122 Type 2 diabetes mellitus with diabetic chronic kidney disease: Secondary | ICD-10-CM | POA: Insufficient documentation

## 2018-06-04 DIAGNOSIS — I129 Hypertensive chronic kidney disease with stage 1 through stage 4 chronic kidney disease, or unspecified chronic kidney disease: Secondary | ICD-10-CM | POA: Insufficient documentation

## 2018-06-04 DIAGNOSIS — Z79899 Other long term (current) drug therapy: Secondary | ICD-10-CM | POA: Insufficient documentation

## 2018-06-04 DIAGNOSIS — R52 Pain, unspecified: Secondary | ICD-10-CM | POA: Diagnosis not present

## 2018-06-04 DIAGNOSIS — J449 Chronic obstructive pulmonary disease, unspecified: Secondary | ICD-10-CM | POA: Insufficient documentation

## 2018-06-04 DIAGNOSIS — N183 Chronic kidney disease, stage 3 (moderate): Secondary | ICD-10-CM | POA: Diagnosis not present

## 2018-06-04 DIAGNOSIS — E1165 Type 2 diabetes mellitus with hyperglycemia: Secondary | ICD-10-CM | POA: Diagnosis not present

## 2018-06-04 DIAGNOSIS — Z794 Long term (current) use of insulin: Secondary | ICD-10-CM | POA: Diagnosis not present

## 2018-06-04 DIAGNOSIS — Z7982 Long term (current) use of aspirin: Secondary | ICD-10-CM | POA: Insufficient documentation

## 2018-06-04 DIAGNOSIS — F17220 Nicotine dependence, chewing tobacco, uncomplicated: Secondary | ICD-10-CM | POA: Diagnosis not present

## 2018-06-04 DIAGNOSIS — R739 Hyperglycemia, unspecified: Secondary | ICD-10-CM | POA: Diagnosis not present

## 2018-06-04 LAB — CBC
HEMATOCRIT: 41.5 % (ref 39.0–52.0)
Hemoglobin: 13.9 g/dL (ref 13.0–17.0)
MCH: 30.2 pg (ref 26.0–34.0)
MCHC: 33.5 g/dL (ref 30.0–36.0)
MCV: 90 fL (ref 78.0–100.0)
Platelets: 195 10*3/uL (ref 150–400)
RBC: 4.61 MIL/uL (ref 4.22–5.81)
RDW: 13.4 % (ref 11.5–15.5)
WBC: 7.4 10*3/uL (ref 4.0–10.5)

## 2018-06-04 LAB — I-STAT CHEM 8, ED
BUN: 38 mg/dL — AB (ref 8–23)
CALCIUM ION: 1.17 mmol/L (ref 1.15–1.40)
CHLORIDE: 91 mmol/L — AB (ref 98–111)
Creatinine, Ser: 2.3 mg/dL — ABNORMAL HIGH (ref 0.61–1.24)
Glucose, Bld: 400 mg/dL — ABNORMAL HIGH (ref 70–99)
HCT: 43 % (ref 39.0–52.0)
Hemoglobin: 14.6 g/dL (ref 13.0–17.0)
Potassium: 4.7 mmol/L (ref 3.5–5.1)
Sodium: 124 mmol/L — ABNORMAL LOW (ref 135–145)
TCO2: 24 mmol/L (ref 22–32)

## 2018-06-04 LAB — CBG MONITORING, ED: Glucose-Capillary: 410 mg/dL — ABNORMAL HIGH (ref 70–99)

## 2018-06-04 NOTE — ED Triage Notes (Signed)
Pt BIB [facility] for eval of hyperglycemia and ?UTI. EMS reports CBG has been reading "high" all day, CBG for EMS 512. Pt also endorses mild dysuria and has malodorous urine. GCS 15

## 2018-06-04 NOTE — ED Provider Notes (Signed)
[facility] EMERGENCY DEPARTMENT Provider Note   CSN: [id number] Arrival date & time: 06/04/18  2314     History   Chief Complaint Chief Complaint  Patient presents with  . Hyperglycemia    HPI William [name] is a 72 y.o. male.  The history is provided by the patient, the EMS personnel and medical records.  Hyperglycemia  Severity:  Severe Onset quality:  Gradual Duration:  1 day Timing:  Constant Progression:  Unchanged Chronicity:  Chronic Diabetes status:  Controlled with insulin Context: not change in medication and not insulin pump use   Relieved by:  Nothing Ineffective treatments:  None tried Associated symptoms: dysuria   Associated symptoms: no abdominal pain, no chest pain, no fever, no increased thirst and no shortness of breath   Risk factors: obesity     Past Medical History:  Diagnosis Date  . Abnormality of gait 01/14/2016  . Alcohol abuse 2009   dependency as exhibited by ETOH withdrawal seizure.   . Alcohol withdrawal seizure ([place]) 2013  . Anemia 2011   Normocytic.   . Cataract   . Chronic renal insufficiency, stage III (moderate) ([place]) 02/26/2014  . Depression with anxiety   . Hearing impairment   . HTN (hypertension), benign 02/26/2014  . Hyperlipemia   . Seizures ([place])   . Type II or unspecified type diabetes mellitus with neurological manifestations, not stated as uncontrolled(250.60) 02/26/2014    Patient Active Problem List   Diagnosis Date Noted  . Smokeless tobacco use 12/31/2017  . Dyspepsia 10/20/2017  . Dietary noncompliance 10/20/2017  . COPD mixed type ([place]) 02/17/2017  . Diabetes mellitus due to underlying condition, uncontrolled, with renal complication ([place]) 36/64/4034  . Hyperlipidemia 03/17/2016  . Abnormality of gait 01/14/2016  . Psychosis ([place]) 01/03/2016  . Seizure ([place]) 12/30/2015  . Iron deficiency anemia   . Chronic renal insufficiency, stage III (moderate) ([place]) 02/26/2014  . Urinary  incontinence 02/10/2013  . History of alcohol abuse 12/03/2009  . Anxiety state 09/30/2006  . Depression 09/30/2006  . Hearing impairment 09/30/2006  . Essential hypertension 09/30/2006    Past Surgical History:  Procedure Laterality Date  . NO PAST SURGERIES          Home Medications    Prior to Admission medications   Medication Sig Start Date End Date Taking? Authorizing Provider  amLODipine (NORVASC) 5 MG tablet Take 5 mg by mouth daily.  05/08/16   [provider]  aspirin (ASPIRIN EC) 81 MG EC tablet Take 81 mg by mouth daily. Swallow whole.    [provider]  folic acid (FOLVITE) 1 MG tablet Take 1 tablet (1 mg total) by mouth daily. 05/07/15   Liberty Handy, MD  insulin aspart (NOVOLOG) 100 UNIT/ML injection Inject 10 Units into the skin 3 (three) times daily before meals. Give 10 units SQ with meals. Call MD if CBG >400.  Takes at [place], 1130, 1630    [provider]  insulin glargine (LANTUS) 100 UNIT/ML injection Inject 58 Units into the skin at bedtime.     [provider]  ipratropium-albuterol (DUONEB) 0.5-2.5 (3) MG/3ML SOLN Take 3 mLs by nebulization every 6 (six) hours as needed.    [provider]  lisinopril (PRINIVIL,ZESTRIL) 5 MG tablet Take 5 mg by mouth daily.    [provider]  QUEtiapine (SEROQUEL) 100 MG tablet Take 100 mg by mouth 2 (two) times daily.     [provider]  sertraline (ZOLOFT) 50 MG  tablet Take 50 mg by mouth daily.     [provider]  thiamine (VITAMIN B-1) 100 MG tablet Take 100 mg by mouth daily.     [provider]  valproic acid (DEPAKENE) 250 MG/5ML syrup Take 10 mLs (500 mg total) by mouth 3 (three) times daily. 01/02/16   Liberty Handy, MD    Family History Family History  Family history unknown: Yes    Social History Social History   Tobacco Use  . Smoking status: Former Smoker    Types: Cigars  . Smokeless tobacco: Current User    Types: Chew    Substance Use Topics  . Alcohol use: No    Alcohol/week: 0.0 standard drinks    Comment: 04/2015 I have not drank in over a month "  . Drug use: No     Allergies   Patient has no known allergies.   Review of Systems Review of Systems  Constitutional: Negative for fever.  Eyes: Negative for photophobia.  Respiratory: Negative for shortness of breath.   Cardiovascular: Negative for chest pain.  Gastrointestinal: Negative for abdominal pain.  Endocrine: Negative for polydipsia.  Genitourinary: Positive for dysuria. Negative for flank pain.  All other systems reviewed and are negative.    Physical Exam Updated Vital Signs BP 135/90 (BP Location: Left Arm)   Pulse 79   Temp 98.2 F (36.8 C) (Oral)   Resp 18   Ht 5\' 8"  (1.727 m)   Wt 106.1 kg   SpO2 97%   BMI 35.58 kg/m   Physical Exam  Constitutional: He is oriented to person, place, and time. He appears well-developed and well-nourished. No distress.  HENT:  Head: Normocephalic and atraumatic.  Mouth/Throat: No oropharyngeal exudate.  Eyes: Pupils are equal, round, and reactive to light. Conjunctivae are normal.  Neck: Normal range of motion. Neck supple.  Cardiovascular: Normal rate, regular rhythm, normal heart sounds and intact distal pulses.  Pulmonary/Chest: Effort normal and breath sounds normal. No stridor. He has no wheezes. He has no rales.  Abdominal: Soft. Bowel sounds are normal. He exhibits no mass. There is no tenderness. There is no rebound and no guarding.  Musculoskeletal: Normal range of motion.  Neurological: He is alert and oriented to person, place, and time. He displays normal reflexes.  Skin: Skin is warm and dry. Capillary refill takes less than 2 seconds.     ED Treatments / Results  Labs (all labs ordered are listed, but only abnormal results are displayed) Results for orders placed or performed during the hospital encounter of 06/04/18  CBC  Result Value Ref Range   WBC 7.4 4.0 -  10.5 K/uL   RBC 4.61 4.22 - 5.81 MIL/uL   Hemoglobin 13.9 13.0 - 17.0 g/dL   HCT 41.5 39.0 - 52.0 %   MCV 90.0 78.0 - 100.0 fL   MCH 30.2 26.0 - 34.0 pg   MCHC 33.5 30.0 - 36.0 g/dL   RDW 13.4 11.5 - 15.5 %   Platelets 195 150 - 400 K/uL  Urinalysis, Routine w reflex microscopic  Result Value Ref Range   Color, Urine STRAW (A) YELLOW   APPearance CLEAR CLEAR   Specific Gravity, Urine 1.010 1.005 - 1.030   pH 5.0 5.0 - 8.0   Glucose, UA >=500 (A) NEGATIVE mg/dL   Hgb urine dipstick NEGATIVE NEGATIVE   Bilirubin Urine NEGATIVE NEGATIVE   Ketones, ur NEGATIVE NEGATIVE mg/dL   Protein, ur NEGATIVE NEGATIVE mg/dL   Nitrite NEGATIVE NEGATIVE  Leukocytes, UA NEGATIVE NEGATIVE   RBC / HPF 0-5 0 - 5 RBC/hpf   WBC, UA 0-5 0 - 5 WBC/hpf   Bacteria, UA NONE SEEN NONE SEEN   Mucus PRESENT   CBG monitoring, ED  Result Value Ref Range   Glucose-Capillary 410 (H) 70 - 99 mg/dL  I-Stat Chem 8, ED  Result Value Ref Range   Sodium 124 (L) 135 - 145 mmol/L   Potassium 4.7 3.5 - 5.1 mmol/L   Chloride 91 (L) 98 - 111 mmol/L   BUN 38 (H) 8 - 23 mg/dL   Creatinine, Ser 2.30 (H) 0.61 - 1.24 mg/dL   Glucose, Bld 400 (H) 70 - 99 mg/dL   Calcium, Ion 1.17 1.15 - 1.40 mmol/L   TCO2 24 22 - 32 mmol/L   Hemoglobin 14.6 13.0 - 17.0 g/dL   HCT 43.0 39.0 - 52.0 %  CBG monitoring, ED  Result Value Ref Range   Glucose-Capillary 244 (H) 70 - 99 mg/dL   No results found.  EKG None  Radiology No results found.  Procedures Procedures (including critical care time)  Medications Ordered in ED Medications  0.9 %  sodium chloride infusion ( Intravenous New Bag/Given 06/05/18 0024)  insulin aspart (novoLOG) injection 5 Units (5 Units Intravenous Given 06/05/18 0024)     Final Clinical Impressions(s) / ED Diagnoses   No Uti SUGAR MARKEDLY IMPROVED.     Return for fevers >100.4 unrelieved by medication, shortness of breath, intractable vomiting, or diarrhea, Inability to tolerate liquids or  food, cough, altered mental status or any concerns. No signs of systemic illness or infection. The patient is nontoxic-appearing on exam and vital signs are within normal limits.   I have reviewed the triage vital signs and the nursing notes. Pertinent labs &imaging results that were available during my care of the patient were reviewed by me and considered in my medical decision making (see chart for details).  After history, exam, and medical workup I feel the patient has been appropriately medically screened and is safe for discharge home. Pertinent diagnoses were discussed with the patient. Patient was given return precautions.     Dimonique Bourdeau, MD 06/05/18 [phone]

## 2018-06-05 ENCOUNTER — Encounter (HOSPITAL_COMMUNITY): Payer: Self-pay | Admitting: Emergency Medicine

## 2018-06-05 DIAGNOSIS — M2550 Pain in unspecified joint: Secondary | ICD-10-CM | POA: Diagnosis not present

## 2018-06-05 DIAGNOSIS — Z7401 Bed confinement status: Secondary | ICD-10-CM | POA: Diagnosis not present

## 2018-06-05 DIAGNOSIS — R410 Disorientation, unspecified: Secondary | ICD-10-CM | POA: Diagnosis not present

## 2018-06-05 LAB — CBG MONITORING, ED: GLUCOSE-CAPILLARY: 244 mg/dL — AB (ref 70–99)

## 2018-06-05 LAB — URINALYSIS, ROUTINE W REFLEX MICROSCOPIC
Bacteria, UA: NONE SEEN
Bilirubin Urine: NEGATIVE
HGB URINE DIPSTICK: NEGATIVE
KETONES UR: NEGATIVE mg/dL
LEUKOCYTES UA: NEGATIVE
Nitrite: NEGATIVE
PROTEIN: NEGATIVE mg/dL
Specific Gravity, Urine: 1.01 (ref 1.005–1.030)
pH: 5 (ref 5.0–8.0)

## 2018-06-05 MED ORDER — INSULIN ASPART 100 UNIT/ML ~~LOC~~ SOLN
5.0000 [IU] | Freq: Once | SUBCUTANEOUS | Status: AC
  Administered 2018-06-05: 5 [IU] via INTRAVENOUS
  Filled 2018-06-05: qty 1

## 2018-06-05 MED ORDER — SODIUM CHLORIDE 0.9 % IV SOLN
INTRAVENOUS | Status: DC
  Administered 2018-06-05: via INTRAVENOUS

## 2018-06-05 NOTE — ED Notes (Signed)
Patient transported back to [facility] by [facility].  Patient belongings all taken back by transport service.  A&O to baseline.

## 2018-06-05 NOTE — ED Notes (Signed)
Notified [facility] for transportation

## 2018-06-06 LAB — URINE CULTURE: Culture: NO GROWTH

## 2018-06-07 DIAGNOSIS — N183 Chronic kidney disease, stage 3 (moderate): Secondary | ICD-10-CM | POA: Diagnosis not present

## 2018-06-07 DIAGNOSIS — I10 Essential (primary) hypertension: Secondary | ICD-10-CM | POA: Diagnosis not present

## 2018-06-07 DIAGNOSIS — E119 Type 2 diabetes mellitus without complications: Secondary | ICD-10-CM | POA: Diagnosis not present

## 2018-06-07 DIAGNOSIS — G40802 Other epilepsy, not intractable, without status epilepticus: Secondary | ICD-10-CM | POA: Diagnosis not present

## 2018-06-08 DIAGNOSIS — F333 Major depressive disorder, recurrent, severe with psychotic symptoms: Secondary | ICD-10-CM | POA: Diagnosis not present

## 2018-06-10 DIAGNOSIS — R4689 Other symptoms and signs involving appearance and behavior: Secondary | ICD-10-CM | POA: Diagnosis not present

## 2018-06-10 DIAGNOSIS — E119 Type 2 diabetes mellitus without complications: Secondary | ICD-10-CM | POA: Diagnosis not present

## 2018-06-10 DIAGNOSIS — F0281 Dementia in other diseases classified elsewhere with behavioral disturbance: Secondary | ICD-10-CM | POA: Diagnosis not present

## 2018-06-10 DIAGNOSIS — I10 Essential (primary) hypertension: Secondary | ICD-10-CM | POA: Diagnosis not present

## 2018-06-15 DIAGNOSIS — F333 Major depressive disorder, recurrent, severe with psychotic symptoms: Secondary | ICD-10-CM | POA: Diagnosis not present

## 2018-06-22 DIAGNOSIS — F333 Major depressive disorder, recurrent, severe with psychotic symptoms: Secondary | ICD-10-CM | POA: Diagnosis not present

## 2018-07-04 DIAGNOSIS — F333 Major depressive disorder, recurrent, severe with psychotic symptoms: Secondary | ICD-10-CM | POA: Diagnosis not present

## 2018-07-05 DIAGNOSIS — Z23 Encounter for immunization: Secondary | ICD-10-CM | POA: Diagnosis not present

## 2018-07-06 DIAGNOSIS — G301 Alzheimer's disease with late onset: Secondary | ICD-10-CM | POA: Diagnosis not present

## 2018-07-06 DIAGNOSIS — G40802 Other epilepsy, not intractable, without status epilepticus: Secondary | ICD-10-CM | POA: Diagnosis not present

## 2018-07-06 DIAGNOSIS — E119 Type 2 diabetes mellitus without complications: Secondary | ICD-10-CM | POA: Diagnosis not present

## 2018-07-19 DIAGNOSIS — E119 Type 2 diabetes mellitus without complications: Secondary | ICD-10-CM | POA: Diagnosis not present

## 2018-07-19 DIAGNOSIS — I10 Essential (primary) hypertension: Secondary | ICD-10-CM | POA: Diagnosis not present

## 2018-07-19 DIAGNOSIS — N183 Chronic kidney disease, stage 3 (moderate): Secondary | ICD-10-CM | POA: Diagnosis not present

## 2018-07-19 DIAGNOSIS — G40802 Other epilepsy, not intractable, without status epilepticus: Secondary | ICD-10-CM | POA: Diagnosis not present

## 2018-07-20 DIAGNOSIS — E119 Type 2 diabetes mellitus without complications: Secondary | ICD-10-CM | POA: Diagnosis not present

## 2018-07-20 DIAGNOSIS — Z79899 Other long term (current) drug therapy: Secondary | ICD-10-CM | POA: Diagnosis not present

## 2018-07-23 DIAGNOSIS — F411 Generalized anxiety disorder: Secondary | ICD-10-CM | POA: Diagnosis not present

## 2018-07-23 DIAGNOSIS — F0281 Dementia in other diseases classified elsewhere with behavioral disturbance: Secondary | ICD-10-CM | POA: Diagnosis not present

## 2018-07-23 DIAGNOSIS — F331 Major depressive disorder, recurrent, moderate: Secondary | ICD-10-CM | POA: Diagnosis not present

## 2018-07-27 DIAGNOSIS — F333 Major depressive disorder, recurrent, severe with psychotic symptoms: Secondary | ICD-10-CM | POA: Diagnosis not present

## 2018-08-03 DIAGNOSIS — F333 Major depressive disorder, recurrent, severe with psychotic symptoms: Secondary | ICD-10-CM | POA: Diagnosis not present

## 2018-08-06 DIAGNOSIS — F22 Delusional disorders: Secondary | ICD-10-CM | POA: Diagnosis not present

## 2018-08-06 DIAGNOSIS — G40802 Other epilepsy, not intractable, without status epilepticus: Secondary | ICD-10-CM | POA: Diagnosis not present

## 2018-08-06 DIAGNOSIS — E119 Type 2 diabetes mellitus without complications: Secondary | ICD-10-CM | POA: Diagnosis not present

## 2018-08-06 DIAGNOSIS — G301 Alzheimer's disease with late onset: Secondary | ICD-10-CM | POA: Diagnosis not present

## 2018-08-17 DIAGNOSIS — I739 Peripheral vascular disease, unspecified: Secondary | ICD-10-CM | POA: Diagnosis not present

## 2018-08-17 DIAGNOSIS — L603 Nail dystrophy: Secondary | ICD-10-CM | POA: Diagnosis not present

## 2018-08-17 DIAGNOSIS — B351 Tinea unguium: Secondary | ICD-10-CM | POA: Diagnosis not present

## 2018-08-17 DIAGNOSIS — Q845 Enlarged and hypertrophic nails: Secondary | ICD-10-CM | POA: Diagnosis not present

## 2018-08-17 DIAGNOSIS — F333 Major depressive disorder, recurrent, severe with psychotic symptoms: Secondary | ICD-10-CM | POA: Diagnosis not present

## 2018-08-20 DIAGNOSIS — F411 Generalized anxiety disorder: Secondary | ICD-10-CM | POA: Diagnosis not present

## 2018-08-20 DIAGNOSIS — F331 Major depressive disorder, recurrent, moderate: Secondary | ICD-10-CM | POA: Diagnosis not present

## 2018-08-20 DIAGNOSIS — F0281 Dementia in other diseases classified elsewhere with behavioral disturbance: Secondary | ICD-10-CM | POA: Diagnosis not present

## 2018-08-24 DIAGNOSIS — F333 Major depressive disorder, recurrent, severe with psychotic symptoms: Secondary | ICD-10-CM | POA: Diagnosis not present

## 2018-08-31 DIAGNOSIS — E119 Type 2 diabetes mellitus without complications: Secondary | ICD-10-CM | POA: Diagnosis not present

## 2018-08-31 DIAGNOSIS — I10 Essential (primary) hypertension: Secondary | ICD-10-CM | POA: Diagnosis not present

## 2018-08-31 DIAGNOSIS — F22 Delusional disorders: Secondary | ICD-10-CM | POA: Diagnosis not present

## 2018-08-31 DIAGNOSIS — G40802 Other epilepsy, not intractable, without status epilepticus: Secondary | ICD-10-CM | POA: Diagnosis not present

## 2018-08-31 DIAGNOSIS — F333 Major depressive disorder, recurrent, severe with psychotic symptoms: Secondary | ICD-10-CM | POA: Diagnosis not present

## 2018-09-01 DIAGNOSIS — Z79899 Other long term (current) drug therapy: Secondary | ICD-10-CM | POA: Diagnosis not present

## 2018-09-01 DIAGNOSIS — R569 Unspecified convulsions: Secondary | ICD-10-CM | POA: Diagnosis not present

## 2018-09-01 DIAGNOSIS — D649 Anemia, unspecified: Secondary | ICD-10-CM | POA: Diagnosis not present

## 2018-09-02 DIAGNOSIS — G40802 Other epilepsy, not intractable, without status epilepticus: Secondary | ICD-10-CM | POA: Diagnosis not present

## 2018-09-02 DIAGNOSIS — F0281 Dementia in other diseases classified elsewhere with behavioral disturbance: Secondary | ICD-10-CM | POA: Diagnosis not present

## 2018-09-02 DIAGNOSIS — G301 Alzheimer's disease with late onset: Secondary | ICD-10-CM | POA: Diagnosis not present

## 2018-09-02 DIAGNOSIS — E119 Type 2 diabetes mellitus without complications: Secondary | ICD-10-CM | POA: Diagnosis not present

## 2018-09-07 DIAGNOSIS — F333 Major depressive disorder, recurrent, severe with psychotic symptoms: Secondary | ICD-10-CM | POA: Diagnosis not present

## 2018-09-14 DIAGNOSIS — F333 Major depressive disorder, recurrent, severe with psychotic symptoms: Secondary | ICD-10-CM | POA: Diagnosis not present

## 2018-09-20 DIAGNOSIS — F333 Major depressive disorder, recurrent, severe with psychotic symptoms: Secondary | ICD-10-CM | POA: Diagnosis not present

## 2018-09-24 DIAGNOSIS — F0281 Dementia in other diseases classified elsewhere with behavioral disturbance: Secondary | ICD-10-CM | POA: Diagnosis not present

## 2018-09-24 DIAGNOSIS — F331 Major depressive disorder, recurrent, moderate: Secondary | ICD-10-CM | POA: Diagnosis not present

## 2018-09-24 DIAGNOSIS — F411 Generalized anxiety disorder: Secondary | ICD-10-CM | POA: Diagnosis not present

## 2018-09-27 DIAGNOSIS — F333 Major depressive disorder, recurrent, severe with psychotic symptoms: Secondary | ICD-10-CM | POA: Diagnosis not present

## 2018-09-28 DIAGNOSIS — E119 Type 2 diabetes mellitus without complications: Secondary | ICD-10-CM | POA: Diagnosis not present

## 2018-09-28 DIAGNOSIS — L309 Dermatitis, unspecified: Secondary | ICD-10-CM | POA: Diagnosis not present

## 2018-09-28 DIAGNOSIS — F22 Delusional disorders: Secondary | ICD-10-CM | POA: Diagnosis not present

## 2018-09-28 DIAGNOSIS — I10 Essential (primary) hypertension: Secondary | ICD-10-CM | POA: Diagnosis not present

## 2018-09-30 DIAGNOSIS — E119 Type 2 diabetes mellitus without complications: Secondary | ICD-10-CM | POA: Diagnosis not present

## 2018-09-30 DIAGNOSIS — G301 Alzheimer's disease with late onset: Secondary | ICD-10-CM | POA: Diagnosis not present

## 2018-09-30 DIAGNOSIS — I10 Essential (primary) hypertension: Secondary | ICD-10-CM | POA: Diagnosis not present

## 2018-09-30 DIAGNOSIS — G40802 Other epilepsy, not intractable, without status epilepticus: Secondary | ICD-10-CM | POA: Diagnosis not present

## 2018-10-05 DIAGNOSIS — R569 Unspecified convulsions: Secondary | ICD-10-CM | POA: Diagnosis not present

## 2018-10-05 DIAGNOSIS — E119 Type 2 diabetes mellitus without complications: Secondary | ICD-10-CM | POA: Diagnosis not present

## 2018-10-05 DIAGNOSIS — Z79899 Other long term (current) drug therapy: Secondary | ICD-10-CM | POA: Diagnosis not present

## 2018-10-12 DIAGNOSIS — F333 Major depressive disorder, recurrent, severe with psychotic symptoms: Secondary | ICD-10-CM | POA: Diagnosis not present

## 2018-10-19 DIAGNOSIS — F333 Major depressive disorder, recurrent, severe with psychotic symptoms: Secondary | ICD-10-CM | POA: Diagnosis not present

## 2018-10-19 DIAGNOSIS — E119 Type 2 diabetes mellitus without complications: Secondary | ICD-10-CM | POA: Diagnosis not present

## 2018-10-19 DIAGNOSIS — R262 Difficulty in walking, not elsewhere classified: Secondary | ICD-10-CM | POA: Diagnosis not present

## 2018-10-19 DIAGNOSIS — F28 Other psychotic disorder not due to a substance or known physiological condition: Secondary | ICD-10-CM | POA: Diagnosis not present

## 2018-10-19 DIAGNOSIS — F0281 Dementia in other diseases classified elsewhere with behavioral disturbance: Secondary | ICD-10-CM | POA: Diagnosis not present

## 2018-10-26 DIAGNOSIS — G40802 Other epilepsy, not intractable, without status epilepticus: Secondary | ICD-10-CM | POA: Diagnosis not present

## 2018-10-26 DIAGNOSIS — F333 Major depressive disorder, recurrent, severe with psychotic symptoms: Secondary | ICD-10-CM | POA: Diagnosis not present

## 2018-10-26 DIAGNOSIS — I10 Essential (primary) hypertension: Secondary | ICD-10-CM | POA: Diagnosis not present

## 2018-10-26 DIAGNOSIS — E119 Type 2 diabetes mellitus without complications: Secondary | ICD-10-CM | POA: Diagnosis not present

## 2018-10-26 DIAGNOSIS — F22 Delusional disorders: Secondary | ICD-10-CM | POA: Diagnosis not present

## 2018-10-28 DIAGNOSIS — I10 Essential (primary) hypertension: Secondary | ICD-10-CM | POA: Diagnosis not present

## 2018-10-28 DIAGNOSIS — G40802 Other epilepsy, not intractable, without status epilepticus: Secondary | ICD-10-CM | POA: Diagnosis not present

## 2018-10-28 DIAGNOSIS — R32 Unspecified urinary incontinence: Secondary | ICD-10-CM | POA: Diagnosis not present

## 2018-10-28 DIAGNOSIS — E119 Type 2 diabetes mellitus without complications: Secondary | ICD-10-CM | POA: Diagnosis not present

## 2018-10-29 DIAGNOSIS — F411 Generalized anxiety disorder: Secondary | ICD-10-CM | POA: Diagnosis not present

## 2018-10-29 DIAGNOSIS — F331 Major depressive disorder, recurrent, moderate: Secondary | ICD-10-CM | POA: Diagnosis not present

## 2018-11-02 DIAGNOSIS — F333 Major depressive disorder, recurrent, severe with psychotic symptoms: Secondary | ICD-10-CM | POA: Diagnosis not present

## 2018-11-09 DIAGNOSIS — F333 Major depressive disorder, recurrent, severe with psychotic symptoms: Secondary | ICD-10-CM | POA: Diagnosis not present

## 2018-11-16 DIAGNOSIS — F333 Major depressive disorder, recurrent, severe with psychotic symptoms: Secondary | ICD-10-CM | POA: Diagnosis not present

## 2018-11-23 DIAGNOSIS — F333 Major depressive disorder, recurrent, severe with psychotic symptoms: Secondary | ICD-10-CM | POA: Diagnosis not present

## 2018-11-26 DIAGNOSIS — F331 Major depressive disorder, recurrent, moderate: Secondary | ICD-10-CM | POA: Diagnosis not present

## 2018-11-26 DIAGNOSIS — G301 Alzheimer's disease with late onset: Secondary | ICD-10-CM | POA: Diagnosis not present

## 2018-11-26 DIAGNOSIS — E119 Type 2 diabetes mellitus without complications: Secondary | ICD-10-CM | POA: Diagnosis not present

## 2018-11-26 DIAGNOSIS — I10 Essential (primary) hypertension: Secondary | ICD-10-CM | POA: Diagnosis not present

## 2018-11-26 DIAGNOSIS — R32 Unspecified urinary incontinence: Secondary | ICD-10-CM | POA: Diagnosis not present

## 2018-11-26 DIAGNOSIS — F411 Generalized anxiety disorder: Secondary | ICD-10-CM | POA: Diagnosis not present

## 2018-11-27 DIAGNOSIS — R569 Unspecified convulsions: Secondary | ICD-10-CM | POA: Diagnosis not present

## 2018-11-27 DIAGNOSIS — R41841 Cognitive communication deficit: Secondary | ICD-10-CM | POA: Diagnosis not present

## 2018-11-30 DIAGNOSIS — F333 Major depressive disorder, recurrent, severe with psychotic symptoms: Secondary | ICD-10-CM | POA: Diagnosis not present

## 2018-12-07 DIAGNOSIS — E119 Type 2 diabetes mellitus without complications: Secondary | ICD-10-CM | POA: Diagnosis not present

## 2018-12-07 DIAGNOSIS — R32 Unspecified urinary incontinence: Secondary | ICD-10-CM | POA: Diagnosis not present

## 2018-12-07 DIAGNOSIS — G40802 Other epilepsy, not intractable, without status epilepticus: Secondary | ICD-10-CM | POA: Diagnosis not present

## 2018-12-07 DIAGNOSIS — I10 Essential (primary) hypertension: Secondary | ICD-10-CM | POA: Diagnosis not present

## 2018-12-08 DIAGNOSIS — F333 Major depressive disorder, recurrent, severe with psychotic symptoms: Secondary | ICD-10-CM | POA: Diagnosis not present

## 2018-12-14 ENCOUNTER — Inpatient Hospital Stay (HOSPITAL_COMMUNITY)
Admission: EM | Admit: 2018-12-14 | Discharge: 2018-12-24 | DRG: 100 | Disposition: A | Discharge status: Skilled Nursing Facility | Payer: Medicare Other | Source: Skilled Nursing Facility | Attending: Internal Medicine | Admitting: Internal Medicine

## 2018-12-14 ENCOUNTER — Emergency Department (HOSPITAL_COMMUNITY): Payer: Medicare Other

## 2018-12-14 ENCOUNTER — Other Ambulatory Visit: Payer: Self-pay

## 2018-12-14 DIAGNOSIS — Z9119 Patient's noncompliance with other medical treatment and regimen: Secondary | ICD-10-CM

## 2018-12-14 DIAGNOSIS — Z79899 Other long term (current) drug therapy: Secondary | ICD-10-CM

## 2018-12-14 DIAGNOSIS — R4182 Altered mental status, unspecified: Secondary | ICD-10-CM | POA: Diagnosis not present

## 2018-12-14 DIAGNOSIS — I10 Essential (primary) hypertension: Secondary | ICD-10-CM | POA: Diagnosis not present

## 2018-12-14 DIAGNOSIS — R569 Unspecified convulsions: Secondary | ICD-10-CM

## 2018-12-14 DIAGNOSIS — F0391 Unspecified dementia with behavioral disturbance: Secondary | ICD-10-CM | POA: Diagnosis not present

## 2018-12-14 DIAGNOSIS — E1165 Type 2 diabetes mellitus with hyperglycemia: Secondary | ICD-10-CM | POA: Diagnosis not present

## 2018-12-14 DIAGNOSIS — E0829 Diabetes mellitus due to underlying condition with other diabetic kidney complication: Secondary | ICD-10-CM | POA: Diagnosis present

## 2018-12-14 DIAGNOSIS — I469 Cardiac arrest, cause unspecified: Secondary | ICD-10-CM

## 2018-12-14 DIAGNOSIS — R778 Other specified abnormalities of plasma proteins: Secondary | ICD-10-CM | POA: Diagnosis present

## 2018-12-14 DIAGNOSIS — F32A Depression, unspecified: Secondary | ICD-10-CM | POA: Diagnosis present

## 2018-12-14 DIAGNOSIS — F329 Major depressive disorder, single episode, unspecified: Secondary | ICD-10-CM | POA: Diagnosis present

## 2018-12-14 DIAGNOSIS — Z66 Do not resuscitate: Secondary | ICD-10-CM | POA: Diagnosis not present

## 2018-12-14 DIAGNOSIS — E875 Hyperkalemia: Secondary | ICD-10-CM | POA: Diagnosis not present

## 2018-12-14 DIAGNOSIS — R7989 Other specified abnormal findings of blood chemistry: Secondary | ICD-10-CM | POA: Diagnosis not present

## 2018-12-14 DIAGNOSIS — N179 Acute kidney failure, unspecified: Secondary | ICD-10-CM | POA: Diagnosis not present

## 2018-12-14 DIAGNOSIS — N184 Chronic kidney disease, stage 4 (severe): Secondary | ICD-10-CM | POA: Diagnosis present

## 2018-12-14 DIAGNOSIS — Z515 Encounter for palliative care: Secondary | ICD-10-CM

## 2018-12-14 DIAGNOSIS — Z6833 Body mass index (BMI) 33.0-33.9, adult: Secondary | ICD-10-CM

## 2018-12-14 DIAGNOSIS — G40909 Epilepsy, unspecified, not intractable, without status epilepticus: Principal | ICD-10-CM | POA: Diagnosis present

## 2018-12-14 DIAGNOSIS — I959 Hypotension, unspecified: Secondary | ICD-10-CM | POA: Diagnosis not present

## 2018-12-14 DIAGNOSIS — E871 Hypo-osmolality and hyponatremia: Secondary | ICD-10-CM | POA: Diagnosis not present

## 2018-12-14 DIAGNOSIS — I5043 Acute on chronic combined systolic (congestive) and diastolic (congestive) heart failure: Secondary | ICD-10-CM | POA: Diagnosis not present

## 2018-12-14 DIAGNOSIS — F17220 Nicotine dependence, chewing tobacco, uncomplicated: Secondary | ICD-10-CM | POA: Diagnosis present

## 2018-12-14 DIAGNOSIS — F419 Anxiety disorder, unspecified: Secondary | ICD-10-CM | POA: Diagnosis present

## 2018-12-14 DIAGNOSIS — T45515A Adverse effect of anticoagulants, initial encounter: Secondary | ICD-10-CM | POA: Diagnosis not present

## 2018-12-14 DIAGNOSIS — IMO0002 Reserved for concepts with insufficient information to code with codable children: Secondary | ICD-10-CM | POA: Diagnosis present

## 2018-12-14 DIAGNOSIS — Z794 Long term (current) use of insulin: Secondary | ICD-10-CM

## 2018-12-14 DIAGNOSIS — L309 Dermatitis, unspecified: Secondary | ICD-10-CM | POA: Diagnosis present

## 2018-12-14 DIAGNOSIS — E0865 Diabetes mellitus due to underlying condition with hyperglycemia: Secondary | ICD-10-CM

## 2018-12-14 DIAGNOSIS — E1149 Type 2 diabetes mellitus with other diabetic neurological complication: Secondary | ICD-10-CM | POA: Diagnosis present

## 2018-12-14 DIAGNOSIS — R404 Transient alteration of awareness: Secondary | ICD-10-CM | POA: Diagnosis not present

## 2018-12-14 DIAGNOSIS — N12 Tubulo-interstitial nephritis, not specified as acute or chronic: Secondary | ICD-10-CM | POA: Diagnosis present

## 2018-12-14 DIAGNOSIS — N183 Chronic kidney disease, stage 3 (moderate): Secondary | ICD-10-CM | POA: Diagnosis present

## 2018-12-14 DIAGNOSIS — I130 Hypertensive heart and chronic kidney disease with heart failure and stage 1 through stage 4 chronic kidney disease, or unspecified chronic kidney disease: Secondary | ICD-10-CM | POA: Diagnosis not present

## 2018-12-14 DIAGNOSIS — J209 Acute bronchitis, unspecified: Secondary | ICD-10-CM | POA: Diagnosis not present

## 2018-12-14 DIAGNOSIS — N2589 Other disorders resulting from impaired renal tubular function: Secondary | ICD-10-CM | POA: Diagnosis present

## 2018-12-14 DIAGNOSIS — E785 Hyperlipidemia, unspecified: Secondary | ICD-10-CM | POA: Diagnosis present

## 2018-12-14 DIAGNOSIS — E11649 Type 2 diabetes mellitus with hypoglycemia without coma: Secondary | ICD-10-CM | POA: Diagnosis not present

## 2018-12-14 DIAGNOSIS — R0602 Shortness of breath: Secondary | ICD-10-CM

## 2018-12-14 DIAGNOSIS — E669 Obesity, unspecified: Secondary | ICD-10-CM | POA: Diagnosis present

## 2018-12-14 DIAGNOSIS — H9190 Unspecified hearing loss, unspecified ear: Secondary | ICD-10-CM | POA: Diagnosis present

## 2018-12-14 DIAGNOSIS — R062 Wheezing: Secondary | ICD-10-CM

## 2018-12-14 DIAGNOSIS — D631 Anemia in chronic kidney disease: Secondary | ICD-10-CM | POA: Diagnosis present

## 2018-12-14 DIAGNOSIS — R079 Chest pain, unspecified: Secondary | ICD-10-CM | POA: Diagnosis not present

## 2018-12-14 DIAGNOSIS — E1122 Type 2 diabetes mellitus with diabetic chronic kidney disease: Secondary | ICD-10-CM | POA: Diagnosis present

## 2018-12-14 LAB — CBC WITH DIFFERENTIAL/PLATELET
Abs Immature Granulocytes: 0.06 10*3/uL (ref 0.00–0.07)
BASOS ABS: 0.1 10*3/uL (ref 0.0–0.1)
Basophils Relative: 1 %
EOS ABS: 0.2 10*3/uL (ref 0.0–0.5)
EOS PCT: 3 %
HEMATOCRIT: 40.6 % (ref 39.0–52.0)
Hemoglobin: 12.8 g/dL — ABNORMAL LOW (ref 13.0–17.0)
IMMATURE GRANULOCYTES: 1 %
Lymphocytes Relative: 19 %
Lymphs Abs: 1.6 10*3/uL (ref 0.7–4.0)
MCH: 28.3 pg (ref 26.0–34.0)
MCHC: 31.5 g/dL (ref 30.0–36.0)
MCV: 89.6 fL (ref 80.0–100.0)
MONO ABS: 0.5 10*3/uL (ref 0.1–1.0)
Monocytes Relative: 6 %
Neutro Abs: 6.2 10*3/uL (ref 1.7–7.7)
Neutrophils Relative %: 70 %
Platelets: 212 10*3/uL (ref 150–400)
RBC: 4.53 MIL/uL (ref 4.22–5.81)
RDW: 14 % (ref 11.5–15.5)
WBC: 8.6 10*3/uL (ref 4.0–10.5)
nRBC: 0 % (ref 0.0–0.2)

## 2018-12-14 LAB — BASIC METABOLIC PANEL
Anion gap: 13 (ref 5–15)
BUN: 53 mg/dL — ABNORMAL HIGH (ref 8–23)
CALCIUM: 9.4 mg/dL (ref 8.9–10.3)
CO2: 20 mmol/L — ABNORMAL LOW (ref 22–32)
CREATININE: 2.44 mg/dL — AB (ref 0.61–1.24)
Chloride: 100 mmol/L (ref 98–111)
GFR, EST AFRICAN AMERICAN: 29 mL/min — AB (ref >60)
GFR, EST NON AFRICAN AMERICAN: 25 mL/min — AB (ref >60)
Glucose, Bld: 331 mg/dL — ABNORMAL HIGH (ref 70–99)
Potassium: 5.1 mmol/L (ref 3.5–5.1)
SODIUM: 133 mmol/L — AB (ref 135–145)

## 2018-12-14 LAB — POCT I-STAT EG7
Acid-base deficit: 2 mmol/L (ref 0.0–2.0)
Bicarbonate: 22.8 mmol/L (ref 20.0–28.0)
Calcium, Ion: 1.16 mmol/L (ref 1.15–1.40)
HEMATOCRIT: 38 % — AB (ref 39.0–52.0)
HEMOGLOBIN: 12.9 g/dL — AB (ref 13.0–17.0)
O2 Saturation: 75 %
PCO2 VEN: 39.2 mmHg — AB (ref 44.0–60.0)
PO2 VEN: 41 mmHg (ref 32.0–45.0)
POTASSIUM: 5.1 mmol/L (ref 3.5–5.1)
Sodium: 131 mmol/L — ABNORMAL LOW (ref 135–145)
TCO2: 24 mmol/L (ref 22–32)
pH, Ven: 7.373 (ref 7.250–7.430)

## 2018-12-14 LAB — CBG MONITORING, ED: Glucose-Capillary: 281 mg/dL — ABNORMAL HIGH (ref 70–99)

## 2018-12-14 LAB — MRSA PCR SCREENING: MRSA by PCR: POSITIVE — AB

## 2018-12-14 LAB — TROPONIN I
TROPONIN I: 0.08 ng/mL — AB (ref <0.03)
TROPONIN I: 0.09 ng/mL — AB (ref <0.03)

## 2018-12-14 LAB — GLUCOSE, CAPILLARY: Glucose-Capillary: 249 mg/dL — ABNORMAL HIGH (ref 70–99)

## 2018-12-14 MED ORDER — SENNOSIDES-DOCUSATE SODIUM 8.6-50 MG PO TABS
1.0000 | ORAL_TABLET | Freq: Every evening | ORAL | Status: DC | PRN
  Administered 2018-12-17 – 2018-12-21 (×2): 1 via ORAL
  Filled 2018-12-14 (×2): qty 1

## 2018-12-14 MED ORDER — SERTRALINE HCL 100 MG PO TABS
100.0000 mg | ORAL_TABLET | Freq: Every day | ORAL | Status: DC
  Administered 2018-12-15 – 2018-12-23 (×9): 100 mg via ORAL
  Filled 2018-12-14 (×9): qty 1

## 2018-12-14 MED ORDER — ACETAMINOPHEN 650 MG RE SUPP: 650.0000 mg | Freq: Four times a day (QID) | RECTAL | Status: DC | PRN

## 2018-12-14 MED ORDER — VALPROIC ACID 250 MG/5ML PO SOLN
500.0000 mg | Freq: Three times a day (TID) | ORAL | Status: DC
  Administered 2018-12-15 – 2018-12-19 (×11): 500 mg via ORAL
  Filled 2018-12-14 (×15): qty 10

## 2018-12-14 MED ORDER — HEPARIN SODIUM (PORCINE) 5000 UNIT/ML IJ SOLN
5000.0000 [IU] | Freq: Three times a day (TID) | INTRAMUSCULAR | Status: DC
  Administered 2018-12-14 – 2018-12-21 (×20): 5000 [IU] via SUBCUTANEOUS
  Filled 2018-12-14 (×19): qty 1

## 2018-12-14 MED ORDER — ONDANSETRON HCL 4 MG PO TABS: 4.0000 mg | ORAL_TABLET | Freq: Four times a day (QID) | ORAL | Status: DC | PRN

## 2018-12-14 MED ORDER — INSULIN GLARGINE 100 UNIT/ML ~~LOC~~ SOLN
50.0000 [IU] | Freq: Every day | SUBCUTANEOUS | Status: DC
  Administered 2018-12-15 – 2018-12-18 (×4): 50 [IU] via SUBCUTANEOUS
  Filled 2018-12-14 (×5): qty 0.5

## 2018-12-14 MED ORDER — IPRATROPIUM-ALBUTEROL 0.5-2.5 (3) MG/3ML IN SOLN
3.0000 mL | Freq: Four times a day (QID) | RESPIRATORY_TRACT | Status: DC | PRN
  Administered 2018-12-18: 3 mL via RESPIRATORY_TRACT
  Filled 2018-12-14: qty 3

## 2018-12-14 MED ORDER — ONDANSETRON HCL 4 MG/2ML IJ SOLN: 4.0000 mg | Freq: Four times a day (QID) | INTRAMUSCULAR | Status: DC | PRN

## 2018-12-14 MED ORDER — SODIUM CHLORIDE 0.9 % IV BOLUS
500.0000 mL | Freq: Once | INTRAVENOUS | Status: AC
  Administered 2018-12-14: 500 mL via INTRAVENOUS

## 2018-12-14 MED ORDER — LISINOPRIL 10 MG PO TABS
5.0000 mg | ORAL_TABLET | Freq: Every day | ORAL | Status: DC
  Administered 2018-12-15 – 2018-12-20 (×6): 5 mg via ORAL
  Filled 2018-12-14 (×6): qty 1

## 2018-12-14 MED ORDER — SODIUM CHLORIDE 0.9 % IV SOLN
INTRAVENOUS | Status: DC
  Administered 2018-12-14: 21:00:00 via INTRAVENOUS

## 2018-12-14 MED ORDER — QUETIAPINE FUMARATE 100 MG PO TABS
100.0000 mg | ORAL_TABLET | Freq: Two times a day (BID) | ORAL | Status: DC
  Administered 2018-12-15 – 2018-12-24 (×18): 100 mg via ORAL
  Filled 2018-12-14 (×3): qty 4
  Filled 2018-12-14 (×5): qty 1
  Filled 2018-12-14 (×2): qty 4
  Filled 2018-12-14 (×2): qty 1
  Filled 2018-12-14: qty 4
  Filled 2018-12-14 (×3): qty 1
  Filled 2018-12-14: qty 4
  Filled 2018-12-14 (×2): qty 1
  Filled 2018-12-14: qty 4
  Filled 2018-12-14 (×2): qty 1
  Filled 2018-12-14: qty 4
  Filled 2018-12-14 (×2): qty 1
  Filled 2018-12-14: qty 4
  Filled 2018-12-14: qty 1
  Filled 2018-12-14 (×2): qty 4
  Filled 2018-12-14 (×2): qty 1
  Filled 2018-12-14 (×4): qty 4

## 2018-12-14 MED ORDER — INSULIN ASPART 100 UNIT/ML ~~LOC~~ SOLN
0.0000 [IU] | Freq: Three times a day (TID) | SUBCUTANEOUS | Status: DC
  Administered 2018-12-15: 5 [IU] via SUBCUTANEOUS
  Administered 2018-12-15: 3 [IU] via SUBCUTANEOUS
  Administered 2018-12-16: 2 [IU] via SUBCUTANEOUS

## 2018-12-14 MED ORDER — BUSPIRONE HCL 10 MG PO TABS
5.0000 mg | ORAL_TABLET | Freq: Two times a day (BID) | ORAL | Status: DC
  Administered 2018-12-14 – 2018-12-24 (×20): 5 mg via ORAL
  Filled 2018-12-14: qty 0.5
  Filled 2018-12-14 (×4): qty 1
  Filled 2018-12-14 (×4): qty 0.5
  Filled 2018-12-14: qty 1
  Filled 2018-12-14: qty 0.5
  Filled 2018-12-14 (×2): qty 1
  Filled 2018-12-14 (×3): qty 0.5
  Filled 2018-12-14 (×3): qty 1
  Filled 2018-12-14 (×6): qty 0.5
  Filled 2018-12-14 (×2): qty 1
  Filled 2018-12-14: qty 0.5
  Filled 2018-12-14 (×2): qty 1
  Filled 2018-12-14 (×2): qty 0.5
  Filled 2018-12-14 (×4): qty 1
  Filled 2018-12-14 (×3): qty 0.5
  Filled 2018-12-14: qty 1

## 2018-12-14 MED ORDER — ACETAMINOPHEN 325 MG PO TABS: 650.0000 mg | ORAL_TABLET | Freq: Four times a day (QID) | ORAL | Status: DC | PRN

## 2018-12-14 MED ORDER — INSULIN ASPART 100 UNIT/ML ~~LOC~~ SOLN
0.0000 [IU] | Freq: Every day | SUBCUTANEOUS | Status: DC
  Administered 2018-12-14: 2 [IU] via SUBCUTANEOUS
  Administered 2018-12-20: 3 [IU] via SUBCUTANEOUS
  Administered 2018-12-21 – 2018-12-23 (×3): 2 [IU] via SUBCUTANEOUS

## 2018-12-14 MED ORDER — MUPIROCIN 2 % EX OINT
1.0000 "application " | TOPICAL_OINTMENT | Freq: Two times a day (BID) | CUTANEOUS | Status: AC
  Administered 2018-12-14 – 2018-12-19 (×10): 1 via NASAL
  Filled 2018-12-14 (×2): qty 22

## 2018-12-14 MED ORDER — CHLORHEXIDINE GLUCONATE CLOTH 2 % EX PADS
6.0000 | MEDICATED_PAD | Freq: Every day | CUTANEOUS | Status: AC
  Administered 2018-12-15 – 2018-12-19 (×4): 6 via TOPICAL

## 2018-12-14 NOTE — ED Notes (Signed)
Attempted to call report , nurse not available at this time and will call me back when she is ready

## 2018-12-14 NOTE — Consult Note (Signed)
Cardiology Consultation:   Patient ID: William [name] MRN: [id number]; DOB: [birth year]  Admit date: 12/14/2018 Date of Consult: 12/14/2018  Primary Care Provider: Patient, No Pcp Per Primary Cardiologist: No primary care provider on file.  Primary Electrophysiologist:  None    Patient Profile:   William [name] is a 73 y.o. male with a hx of dementia who is being seen today for the evaluation of unresponsiveness/possible cardiac arrest at the request of Dr Tyrone Nine.  History of Present Illness:   William [name] presents from the nursing home after being found poorly responsive.  History is obtained via chart review.  The patient by report is normally awake and alert, reasonably functional.  He was found to be unresponsive today and felt to be pulseless.  CPR was initiated with chest compressions for less than 1 minute.  The patient had return of spontaneous circulation with this and became more coherent.  He did not require further resuscitative measures.  He is brought to the emergency department by EMS for further evaluation.  In review of his chart, he has a history of alcohol abuse and old history of seizures.  It sounds like he is mostly wheelchair-bound.  The patient is noted to have minimal troponin elevation of 0.08 mg/dL and cardiology consultation is requested.  The patient is a poor historian.  He is able to tell me that he has had no chest pain, chest pressure, or shortness of breath.  He has had no fevers, chills, or nausea or vomiting.  He does not know why he is here in the hospital and does not remember anything about the events leading to his hospitalization.  Past Medical History:  Diagnosis Date  . Abnormality of gait 01/14/2016  . Alcohol abuse 2009   dependency as exhibited by ETOH withdrawal seizure.   . Alcohol withdrawal seizure ([place]) 2013  . Anemia 2011   Normocytic.   . Cataract   . Chronic renal insufficiency, stage III (moderate) ([place]) 02/26/2014  .  Depression with anxiety   . Hearing impairment   . HTN (hypertension), benign 02/26/2014  . Hyperlipemia   . Seizures ([place])   . Type II or unspecified type diabetes mellitus with neurological manifestations, not stated as uncontrolled(250.60) 02/26/2014    Past Surgical History:  Procedure Laterality Date  . NO PAST SURGERIES       Home Medications:  Prior to Admission medications   Medication Sig Start Date End Date Taking? Authorizing Provider  acetaminophen (TYLENOL) 650 MG CR tablet Take 650 mg by mouth every 8 (eight) hours.    [provider]  busPIRone (BUSPAR) 5 MG tablet Take 5 mg by mouth 2 (two) times daily.    [provider]  folic acid (FOLVITE) 1 MG tablet Take 1 tablet (1 mg total) by mouth daily. Patient not taking: Reported on 06/05/2018 05/07/15   Liberty Handy, MD  insulin aspart (NOVOLOG) 100 UNIT/ML injection Inject 10 Units into the skin 3 (three) times daily before meals. With meals if Blood Sugar is >180. Call MD if CBG >400.  Takes at [place], 1130, 1630    [provider]  Insulin Glargine (TOUJEO MAX SOLOSTAR) 300 UNIT/ML SOPN Inject 150 Units into the skin daily.    [provider]  ipratropium-albuterol (DUONEB) 0.5-2.5 (3) MG/3ML SOLN Take 3 mLs by nebulization every 6 (six) hours as needed (for COPD).     [provider]  lisinopril (PRINIVIL,ZESTRIL) 5 MG tablet Take 5 mg by mouth daily.  [provider]  QUEtiapine (SEROQUEL) 100 MG tablet Take 100 mg by mouth 2 (two) times daily.     [provider]  sertraline (ZOLOFT) 100 MG tablet Take 100 mg by mouth at bedtime.    [provider]  valproic acid (DEPAKENE) 250 MG/5ML syrup Take 10 mLs (500 mg total) by mouth 3 (three) times daily. 01/02/16   Liberty Handy, MD    Inpatient Medications: Scheduled Meds:  Continuous Infusions:  PRN Meds:   Allergies:   No Known Allergies  Social History:   Social History   Socioeconomic History   . Marital status: Single    Spouse name: Not on file  . Number of children: 0  . Years of education: Not on file  . Highest education level: Not on file  Occupational History  . Not on file  Social Needs  . Financial resource strain: Not on file  . Food insecurity:    Worry: Not on file    Inability: Not on file  . Transportation needs:    Medical: Not on file    Non-medical: Not on file  Tobacco Use  . Smoking status: Former Smoker    Types: Cigars  . Smokeless tobacco: Current User    Types: Chew  Substance and Sexual Activity  . Alcohol use: No    Alcohol/week: 0.0 standard drinks    Comment: 04/2015 I have not drank in over a month "  . Drug use: No  . Sexual activity: Not Currently  Lifestyle  . Physical activity:    Days per week: Not on file    Minutes per session: Not on file  . Stress: Not on file  Relationships  . Social connections:    Talks on phone: Not on file    Gets together: Not on file    Attends religious service: Not on file    Active member of club or organization: Not on file    Attends meetings of clubs or organizations: Not on file    Relationship status: Not on file  . Intimate partner violence:    Fear of current or ex partner: Not on file    Emotionally abused: Not on file    Physically abused: Not on file    Forced sexual activity: Not on file  Other Topics Concern  . Not on file  Social History Narrative   ** Merged History Encounter **[facility]           Family History:    Family History  Family history unknown: Yes     ROS:  Please see the history of present illness.  Unable to obtain an accurate review of systems due to the patient's current mental status. All other ROS reviewed and negative.     Physical Exam/Data:   Vitals:   12/14/18 1423 12/14/18 1554 12/14/18 1700  BP: 100/69 128/78 137/67  Pulse: 66 62 65  Resp: 17 15 19   Temp: 98.2 F (36.8 C)    TempSrc: Oral    SpO2: 98% 98% 99%   No intake or  output data in the 24 hours ending 12/14/18 1723 Last 3 Weights 06/04/2018 01/13/2018 12/31/2017  Weight (lbs) 234 lb 255 lb 12.8 oz 255 lb 12.8 oz  Weight (kg) 106.142 kg 116.03 kg 116.03 kg  Some encounter information is confidential and restricted. Go to Review Flowsheets activity to see all data.     There is no height or weight on file to calculate BMI.  General:  Elderly male, in no acute distress HEENT: normal Lymph: no adenopathy Neck: no JVD Endocrine:  No thryomegaly Vascular: No carotid bruits; FA pulses 2+ bilaterally   Cardiac:  normal S1, S2; RRR; no murmur  Lungs:  clear to auscultation bilaterally, no wheezing, rhonchi or rales  Abd: soft, nontender, no hepatomegaly  Ext: trace bilateral edema Musculoskeletal:  No deformities, BUE and BLE strength normal and equal Skin: warm and dry  Neuro:  CNs 2-12 intact, no focal abnormalities noted Psych:  Normal affect   EKG:  The EKG was personally reviewed and demonstrates: Normal sinus rhythm 67 bpm, nonspecific ST abnormality, occasional PVC  Relevant CV Studies: pending  Laboratory Data:  Chemistry Recent Labs  Lab 12/14/18 1509 12/14/18 1523  NA 133* 131*  K 5.1 5.1  CL 100  --   CO2 20*  --   GLUCOSE 331*  --   BUN 53*  --   CREATININE 2.44*  --   CALCIUM 9.4  --   GFRNONAA 25*  --   GFRAA 29*  --   ANIONGAP 13  --     No results for input(s): PROT, ALBUMIN, AST, ALT, ALKPHOS, BILITOT in the last 168 hours. Hematology Recent Labs  Lab 12/14/18 1509 12/14/18 1523  WBC 8.6  --   RBC 4.53  --   HGB 12.8* 12.9*  HCT 40.6 38.0*  MCV 89.6  --   MCH 28.3  --   MCHC 31.5  --   RDW 14.0  --   PLT 212  --    Cardiac Enzymes Recent Labs  Lab 12/14/18 1509  TROPONINI 0.08*   No results for input(s): TROPIPOC in the last 168 hours.  BNPNo results for input(s): BNP, PROBNP in the last 168 hours.  DDimer No results for input(s): DDIMER in the last 168 hours.  Radiology/Studies:  Dg Chest 2 View   Result Date: 12/14/2018 CLINICAL DATA:  Chest pain.  Patient status post CPR today. EXAM: CHEST - 2 VIEW COMPARISON:  Single-view of the chest 12/30/2015. PA and lateral chest 11/19/2015. FINDINGS: Lung volumes are somewhat low with basilar atelectasis. No consolidative process, pneumothorax or pleural effusion. Heart size is upper normal. Remote left rib fractures, midthoracic compression fracture and advanced left glenohumeral osteoarthritis noted. No acute bony abnormality. IMPRESSION: No acute disease. Electronically Signed   By: Inge Rise M.D.   On: 12/14/2018 15:36   Ct Head Wo Contrast  Result Date: 12/14/2018 CLINICAL DATA:  Altered mental status as patient found unresponsive in bed. EXAM: CT HEAD WITHOUT CONTRAST TECHNIQUE: Contiguous axial images were obtained from the base of the skull through the vertex without intravenous contrast. COMPARISON:  12/30/2015 FINDINGS: Brain: Ventricles, cisterns and other CSF spaces are unchanged. There is mild age related atrophic change. There is chronic ischemic microvascular disease. There is no mass, mass effect, shift of midline structures or acute hemorrhage. No evidence of acute infarction. Vascular: No hyperdense vessel or unexpected calcification. Skull: Suggestion old nasal bone fracture. Sinuses/Orbits: Orbits are within normal. Complete opacification over the right maxillary sinus slightly worse. Other: None. IMPRESSION: No acute findings. Chronic ischemic microvascular disease and age related atrophic change. Moderate chronic sinus inflammatory change of the right maxillary sinus slightly worse. Findings suggesting old nasal bone fracture. Electronically Signed   By: Marin Olp M.D.   On: 12/14/2018 16:25    Assessment and Plan:   1. Altered level of consciousness with unresponsiveness and possible transient cardiac arrest 2. Minimal troponin elevation 3. Acute  on chronic kidney disease 4. Baseline dementia  Unclear circumstances  around the patient's event earlier today.  I am not sure that he truly suffered a cardiac arrest as it would be unlikely for him to respond so quickly with no symptoms or hemodynamic compromise.  He reportedly took Benadryl and may have had sedation effects of this medicine.  It seems reasonable to cycle his cardiac markers and check an echocardiogram.  Otherwise I do not think he is a candidate for any other cardiac evaluation based on his poor functional capacity at baseline.  We will follow-up after his troponins have cycled and his echocardiogram is completed.  We will also follow his heart rhythm on telemetry to make sure he is not having any arrhythmia.  For questions or updates, please contact [place] Please consult [email or web address] for contact info under   Signed, Sherren Mocha, MD  12/14/2018 5:23 PM

## 2018-12-14 NOTE — ED Notes (Signed)
Dr. Tyrone Nine aware of 0.08 troponin ; no further orders received

## 2018-12-14 NOTE — ED Notes (Signed)
ED TO INPATIENT HANDOFF REPORT  ED Nurse Name and Phone #: William [name] [phone]  S Name/Age/Gender William [name] 73 y.o. male Room/Bed: 967E/938B  Code Status   Code Status: Prior  Home/SNF/Other Nursing Home Patient oriented to: self, place and time Is this baseline? Yes   Triage Complete: Triage complete  Chief Complaint AMS  Triage Note Pt brought in by ems from [facility] facility for c/o AMS ; according to facility they found him unresponsive and Laying in bed , per CNA at facility , patient did not have a pulse and initiated CPR for less than a minute , just compressions were done and got spontaneous return of pulse ; per facility , patient usually alert and talking and walking ; slight facial droop noted to the left side , unknown if this is normal for the patient ; unknown last well time ; pt alert but disoriented x 4 ; pt following simple commands ; hx of seizures    Allergies No Known Allergies  Level of Care/Admitting Diagnosis ED Disposition    ED Disposition Condition [place]: [name] [100100]  Level of Care: Telemetry Medical [104]  I expect the patient will be discharged within 24 hours: Yes  LOW acuity---Tx typically complete <24 hrs---ACUTE conditions typically can be evaluated <24 hours---LABS likely to return to acceptable levels <24 hours---IS near functional baseline---EXPECTED to return to current living arrangement---NOT newly hypoxic: Meets criteria for 5C-Observation unit  Diagnosis: Altered mental status, unspecified [0175102]  Admitting Physician: William [name] [[id number]]  Attending Physician: William [name] [[id number]]  PT Class (Do Not Modify): Observation [104]  PT Acc Code (Do Not Modify): Observation [10022]       B Medical/Surgery History Past Medical History:  Diagnosis Date  . Abnormality of gait 01/14/2016  . Alcohol abuse 2009   dependency as exhibited by ETOH withdrawal seizure.    . Alcohol withdrawal seizure ([place]) 2013  . Anemia 2011   Normocytic.   . Cataract   . Chronic renal insufficiency, stage III (moderate) ([place]) 02/26/2014  . Depression with anxiety   . Hearing impairment   . HTN (hypertension), benign 02/26/2014  . Hyperlipemia   . Seizures ([place])   . Type II or unspecified type diabetes mellitus with neurological manifestations, not stated as uncontrolled(250.60) 02/26/2014   Past Surgical History:  Procedure Laterality Date  . NO PAST SURGERIES       A IV Location/Drains/Wounds Patient Lines/Drains/Airways Status   Active Line/Drains/Airways    Name:   Placement date:   Placement time:   Site:   Days:   Peripheral IV 12/14/18 Left Forearm   12/14/18    -    Forearm   less than 1   Peripheral IV 12/14/18 Right Antecubital   12/14/18    1428    Antecubital   less than 1   Incision 09/03/12 Head Posterior   09/03/12    2031     2293   Incision 09/03/12 Arm   09/03/12    2032     2293   Wound 11/29/11 Abrasion(s);Skin tear Elbow Right skin tear approximately 1.5 inches.    11/29/11    1253    Elbow   2572   Wound 11/29/11 Laceration Ear Left ear laceration approximately 1 inch in length   11/29/11    1254    Ear   2572          Intake/Output Last 24 hours No  intake or output data in the 24 hours ending 12/14/18 1745  Labs/Imaging Results for orders placed or performed during the hospital encounter of 12/14/18 (from the past 48 hour(s))  Basic metabolic panel     Status: Abnormal   Collection Time: 12/14/18  3:09 PM  Result Value Ref Range   Sodium 133 (L) 135 - 145 mmol/L   Potassium 5.1 3.5 - 5.1 mmol/L   Chloride 100 98 - 111 mmol/L   CO2 20 (L) 22 - 32 mmol/L   Glucose, Bld 331 (H) 70 - 99 mg/dL   BUN 53 (H) 8 - 23 mg/dL   Creatinine, Ser 2.44 (H) 0.61 - 1.24 mg/dL   Calcium 9.4 8.9 - 10.3 mg/dL   GFR calc non Af Amer 25 (L) >60 mL/min   GFR calc Af Amer 29 (L) >60 mL/min   Anion gap 13 5 - 15    Comment: Performed at [facility], [address], [place], [place] [address]  Troponin I - Once     Status: Abnormal   Collection Time: 12/14/18  3:09 PM  Result Value Ref Range   Troponin I 0.08 (HH) <0.03 ng/mL    Comment: CRITICAL RESULT CALLED TO, READ BACK BY AND VERIFIED WITH: A William Benincasa,RN 1623 12/14/2018 WBOND Performed at [facility], [place] [address], [place], [place] [address]   CBC with Differential     Status: Abnormal   Collection Time: 12/14/18  3:09 PM  Result Value Ref Range   WBC 8.6 4.0 - 10.5 K/uL   RBC 4.53 4.22 - 5.81 MIL/uL   Hemoglobin 12.8 (L) 13.0 - 17.0 g/dL   HCT 40.6 39.0 - 52.0 %   MCV 89.6 80.0 - 100.0 fL   MCH 28.3 26.0 - 34.0 pg   MCHC 31.5 30.0 - 36.0 g/dL   RDW 14.0 11.5 - 15.5 %   Platelets 212 150 - 400 K/uL   nRBC 0.0 0.0 - 0.2 %   Neutrophils Relative % 70 %   Neutro Abs 6.2 1.7 - 7.7 K/uL   Lymphocytes Relative 19 %   Lymphs Abs 1.6 0.7 - 4.0 K/uL   Monocytes Relative 6 %   Monocytes Absolute 0.5 0.1 - 1.0 K/uL   Eosinophils Relative 3 %   Eosinophils Absolute 0.2 0.0 - 0.5 K/uL   Basophils Relative 1 %   Basophils Absolute 0.1 0.0 - 0.1 K/uL   Immature Granulocytes 1 %   Abs Immature Granulocytes 0.06 0.00 - 0.07 K/uL    Comment: Performed at [facility] [address], [place], [place] [address]  POCT I-Stat EG7     Status: Abnormal   Collection Time: 12/14/18  3:23 PM  Result Value Ref Range   pH, Ven 7.373 7.250 - 7.430   pCO2, Ven 39.2 (L) 44.0 - 60.0 mmHg   pO2, Ven 41.0 32.0 - 45.0 mmHg   Bicarbonate 22.8 20.0 - 28.0 mmol/L   TCO2 24 22 - 32 mmol/L   O2 Saturation 75.0 %   Acid-base deficit 2.0 0.0 - 2.0 mmol/L   Sodium 131 (L) 135 - 145 mmol/L   Potassium 5.1 3.5 - 5.1 mmol/L   Calcium, Ion 1.16 1.15 - 1.40 mmol/L   HCT 38.0 (L) 39.0 - 52.0 %   Hemoglobin 12.9 (L) 13.0 - 17.0 g/dL   Patient temperature HIDE    Sample type VENOUS    Dg Chest 2 View  Result Date: 12/14/2018 CLINICAL DATA:  Chest  pain.  Patient status post CPR  today. EXAM: CHEST - 2 VIEW COMPARISON:  Single-view of the chest 12/30/2015. PA and lateral chest 11/19/2015. FINDINGS: Lung volumes are somewhat low with basilar atelectasis. No consolidative process, pneumothorax or pleural effusion. Heart size is upper normal. Remote left rib fractures, midthoracic compression fracture and advanced left glenohumeral osteoarthritis noted. No acute bony abnormality. IMPRESSION: No acute disease. Electronically Signed   By: Inge Rise M.D.   On: 12/14/2018 15:36   Ct Head Wo Contrast  Result Date: 12/14/2018 CLINICAL DATA:  Altered mental status as patient found unresponsive in bed. EXAM: CT HEAD WITHOUT CONTRAST TECHNIQUE: Contiguous axial images were obtained from the base of the skull through the vertex without intravenous contrast. COMPARISON:  12/30/2015 FINDINGS: Brain: Ventricles, cisterns and other CSF spaces are unchanged. There is mild age related atrophic change. There is chronic ischemic microvascular disease. There is no mass, mass effect, shift of midline structures or acute hemorrhage. No evidence of acute infarction. Vascular: No hyperdense vessel or unexpected calcification. Skull: Suggestion old nasal bone fracture. Sinuses/Orbits: Orbits are within normal. Complete opacification over the right maxillary sinus slightly worse. Other: None. IMPRESSION: No acute findings. Chronic ischemic microvascular disease and age related atrophic change. Moderate chronic sinus inflammatory change of the right maxillary sinus slightly worse. Findings suggesting old nasal bone fracture. Electronically Signed   By: Marin Olp M.D.   On: 12/14/2018 16:25    Pending Labs Unresulted Labs (From admission, onward)    Start     Ordered   12/14/18 1737  Troponin I - Now Then Q6H  Now then every 6 hours,   R     12/14/18 1737   12/14/18 1508  Blood gas, venous (at [facility] and [facility])  ONCE - STAT,   STAT     12/14/18 1507   12/14/18 1450  Urinalysis, Routine w reflex  microscopic  ONCE - STAT,   STAT     12/14/18 1450          Vitals/Pain Today's Vitals   12/14/18 1425 12/14/18 1541 12/14/18 1554 12/14/18 1700  BP:   128/78 137/67  Pulse:   62 65  Resp:   15 19  Temp:      TempSrc:      SpO2:   98% 99%  PainSc: 0-No pain 0-No pain  0-No pain    Isolation Precautions No active isolations  Medications Medications  sodium chloride 0.9 % bolus 500 mL (500 mLs Intravenous New Bag/Given 12/14/18 1542)    Mobility walks with device     Focused Assessments Cardiac Assessment Handoff:  Cardiac Rhythm: Normal sinus rhythm Lab Results  Component Value Date   CKTOTAL 303 (H) 02/26/2014   TROPONINI 0.08 (HH) 12/14/2018   No results found for: DDIMER Does the Patient currently have chest pain? No  , Neuro Assessment Handoff:  Swallow screen pass? No  Cardiac Rhythm: Normal sinus rhythm NIH Stroke Scale ( + Modified Stroke Scale Criteria)  Interval: Initial Level of Consciousness (1a.)   : Alert, keenly responsive LOC Questions (1b. )   +: Answers both questions correctly LOC Commands (1c. )   + : Performs both tasks correctly Best Gaze (2. )  +: Normal Visual (3. )  +: No visual loss Facial Palsy (4. )    : Minor paralysis Motor Arm, Left (5a. )   +: No drift Motor Arm, Right (5b. )   +: No drift Motor Leg, Left (6a. )   +:  No drift Motor Leg, Right (6b. )   +: No drift Limb Ataxia (7. ): Absent Sensory (8. )   +: Normal, no sensory loss Best Language (9. )   +: No aphasia Dysarthria (10. ): Normal Extinction/Inattention (11.)   +: No Abnormality Modified SS Total  +: 0 Complete NIHSS TOTAL: 1     Neuro Assessment: Exceptions to WDL Neuro Checks:   Initial (12/14/18 1542)  Last Documented NIHSS Modified Score: 0 (12/14/18 1542) Has TPA been given? No If patient is a Neuro Trauma and patient is going to OR before floor call report to [place] nurse: [phone] or [phone]     R Recommendations: See Admitting  Provider Note  Report given to:   Additional Notes:

## 2018-12-14 NOTE — ED Provider Notes (Signed)
MSE was initiated and I personally evaluated the patient and placed orders (if any) at  2:48 PM on December 14, 2018.  At this time he is alert and responsive.  He is unaware of why he is here.  He states that his chest hurts, when I touch it.  Heart regular rate and rhythm without murmur lungs clear anteriorly.  There is no chest wall crepitation or instability.  Abdomen is soft and nontender without masses.  Screening evaluation ordered.  The patient appears stable so that the remainder of the MSE may be completed by another provider.   Daleen Bo, MD 12/14/18 [phone]

## 2018-12-14 NOTE — ED Provider Notes (Signed)
[facility] EMERGENCY DEPARTMENT Provider Note   CSN: [id number] Arrival date & time: 12/14/18  1414    History   Chief Complaint Chief Complaint  Patient presents with   Altered Mental Status    HPI William [name] is a 73 y.o. male.     73 yo M with a chief complaints of decreased mental status.  Patient was apparently hard to arouse today and CPR was started upon which the patient woke up and told him to stop.  He had some mild pain to his chest wall after that.  Patient states that he has been otherwise well other than a new skin condition to the left side of his face.  He states that that is been extremely itchy and that he they have been trying some pills that he is not sure what they are to try and help him with.  They have also been trying a cream to the area.  Thinks that that rash has been there for about a month.  He denies cough congestion or fever denies vomiting diarrhea denies abdominal pain.  States he has been eating and drinking well.  Denies dark stool or blood in stool. No other new medicines as far as he knows.   The history is provided by the patient.  Altered Mental Status  Presenting symptoms: no confusion   Associated symptoms: no abdominal pain, no fever, no headaches, no palpitations, no rash and no vomiting   Illness  Severity:  Mild Onset quality:  Sudden Duration:  2 days Timing:  Constant Progression:  Worsening Chronicity:  New Associated symptoms: no abdominal pain, no chest pain, no congestion, no diarrhea, no fever, no headaches, no myalgias, no rash, no shortness of breath and no vomiting     Past Medical History:  Diagnosis Date   Abnormality of gait 01/14/2016   Alcohol abuse 2009   dependency as exhibited by ETOH withdrawal seizure.    Alcohol withdrawal seizure ([place]) 2013   Anemia 2011   Normocytic.    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/17/2017   Diabetes mellitus due to underlying condition, uncontrolled, with renal complication ([place]) 34/91/7915   Hyperlipidemia 03/17/2016   Abnormality of gait 01/14/2016   Psychosis ([place]) 01/03/2016   Seizure ([place]) 12/30/2015   Iron deficiency anemia    Chronic renal insufficiency, stage III (moderate) ([place]) 02/26/2014   Urinary incontinence 02/10/2013   History of alcohol abuse 12/03/2009   Anxiety state 09/30/2006   Depression 09/30/2006   Hearing impairment 09/30/2006   Essential hypertension 09/30/2006    Past Surgical History:  Procedure Laterality Date   NO PAST SURGERIES          Home Medications    Prior to Admission medications   Medication Sig Start Date End Date Taking? Authorizing Provider  acetaminophen (TYLENOL) 650 MG CR tablet Take 650 mg by mouth every 8 (eight) hours.    [provider]  busPIRone (BUSPAR) 5 MG  tablet Take 5 mg by mouth 2 (two) times daily.    [provider]  folic acid (FOLVITE) 1 MG tablet Take 1 tablet (1 mg total) by mouth daily. Patient not taking: Reported on 06/05/2018 05/07/15   Liberty Handy, MD  insulin aspart (NOVOLOG) 100 UNIT/ML injection Inject 10 Units into the skin 3 (three) times daily before meals. With meals if Blood Sugar is >180. Call MD if CBG >400.  Takes at [place], 1130, 1630    [provider]  Insulin Glargine (TOUJEO MAX SOLOSTAR) 300 UNIT/ML SOPN Inject 150 Units into the skin daily.    [provider]    ipratropium-albuterol (DUONEB) 0.5-2.5 (3) MG/3ML SOLN Take 3 mLs by nebulization every 6 (six) hours as needed (for COPD).     [provider]  lisinopril (PRINIVIL,ZESTRIL) 5 MG tablet Take 5 mg by mouth daily.    [provider]  QUEtiapine (SEROQUEL) 100 MG tablet Take 100 mg by mouth 2 (two) times daily.     [provider]  sertraline (ZOLOFT) 100 MG tablet Take 100 mg by mouth at bedtime.    [provider]  valproic acid (DEPAKENE) 250 MG/5ML syrup Take 10 mLs (500 mg total) by mouth 3 (three) times daily. 01/02/16   Liberty Handy, MD    Family History Family History  Family history unknown: Yes    Social History Social History   Tobacco Use   Smoking status: Former Smoker    Types: Cigars   Smokeless tobacco: Current User    Types: Chew  Substance Use Topics   Alcohol use: No    Alcohol/week: 0.0 standard drinks    Comment: 04/2015 I have not drank in over a month "   Drug use: No     Allergies   Patient has no known allergies.   Review of Systems Review of Systems  Constitutional: Negative for chills and fever.  HENT: Negative for congestion and facial swelling.   Eyes: Negative for discharge and visual disturbance.  Respiratory: Negative for shortness of breath.   Cardiovascular: Negative for chest pain and palpitations.  Gastrointestinal: Negative for abdominal pain, diarrhea and vomiting.  Musculoskeletal: Negative for arthralgias and myalgias.  Skin: Positive for color change (itchy). Negative for rash.  Neurological: Negative for tremors, syncope and headaches.  Psychiatric/Behavioral: Negative for confusion and dysphoric mood.     Physical Exam Updated Vital Signs BP 137/67    Pulse 65    Temp 98.2 F (36.8 C) (Oral)    Resp 19    SpO2 99%   Physical Exam Vitals signs and nursing note reviewed.  Constitutional:      Appearance: He is well-developed. He is obese.  HENT:     Head: Normocephalic and  atraumatic.     Comments: Erythema to the left side of the face with some excoriations.  Staining over the ear.  Mildly warm when compared to the other side.  Pruritic nontender Eyes:     Pupils: Pupils are equal, round, and reactive to light.  Neck:     Musculoskeletal: Normal range of motion and neck supple.     Vascular: No JVD.  Cardiovascular:     Rate and Rhythm: Normal rate and regular rhythm.     Heart sounds: No murmur. No friction rub. No gallop.   Pulmonary:     Effort: No respiratory distress.     Breath sounds: No wheezing.  Abdominal:     General: There is no distension.  Tenderness: There is no abdominal tenderness. There is no guarding or rebound.  Musculoskeletal: Normal range of motion.  Skin:    Coloration: Skin is not pale.     Findings: No rash.  Neurological:     Mental Status: He is alert and oriented to person, place, and time.  Psychiatric:        Behavior: Behavior normal.      ED Treatments / Results  Labs (all labs ordered are listed, but only abnormal results are displayed) Labs Reviewed  BASIC METABOLIC PANEL - Abnormal; Notable for the following components:      Result Value   Sodium 133 (*)    CO2 20 (*)    Glucose, Bld 331 (*)    BUN 53 (*)    Creatinine, Ser 2.44 (*)    GFR calc non Af Amer 25 (*)    GFR calc Af Amer 29 (*)    All other components within normal limits  TROPONIN I - Abnormal; Notable for the following components:   Troponin I 0.08 (*)    All other components within normal limits  CBC WITH DIFFERENTIAL/PLATELET - Abnormal; Notable for the following components:   Hemoglobin 12.8 (*)    All other components within normal limits  POCT I-STAT EG7 - Abnormal; Notable for the following components:   pCO2, Ven 39.2 (*)    Sodium 131 (*)    HCT 38.0 (*)    Hemoglobin 12.9 (*)    All other components within normal limits  URINALYSIS, ROUTINE W REFLEX MICROSCOPIC  BLOOD GAS, VENOUS  CBG MONITORING, ED    EKG EKG  Interpretation  Date/Time:  Tuesday December 14 2018 14:17:24 EDT Ventricular Rate:  67 PR Interval:    QRS Duration: 93 QT Interval:  425 QTC Calculation: 449 R Axis:   77 Text Interpretation:  Sinus rhythm Ventricular premature complex Borderline low voltage, extremity leads nonspecific T wave abnormality Since last tracing PVC is new Confirmed by Daleen Bo [id number]) on 12/14/2018 2:45:18 PM   Radiology Dg Chest 2 View  Result Date: 12/14/2018 CLINICAL DATA:  Chest pain.  Patient status post CPR today. EXAM: CHEST - 2 VIEW COMPARISON:  Single-view of the chest 12/30/2015. PA and lateral chest 11/19/2015. FINDINGS: Lung volumes are somewhat low with basilar atelectasis. No consolidative process, pneumothorax or pleural effusion. Heart size is upper normal. Remote left rib fractures, midthoracic compression fracture and advanced left glenohumeral osteoarthritis noted. No acute bony abnormality. IMPRESSION: No acute disease. Electronically Signed   By: Inge Rise M.D.   On: 12/14/2018 15:36   Ct Head Wo Contrast  Result Date: 12/14/2018 CLINICAL DATA:  Altered mental status as patient found unresponsive in bed. EXAM: CT HEAD WITHOUT CONTRAST TECHNIQUE: Contiguous axial images were obtained from the base of the skull through the vertex without intravenous contrast. COMPARISON:  12/30/2015 FINDINGS: Brain: Ventricles, cisterns and other CSF spaces are unchanged. There is mild age related atrophic change. There is chronic ischemic microvascular disease. There is no mass, mass effect, shift of midline structures or acute hemorrhage. No evidence of acute infarction. Vascular: No hyperdense vessel or unexpected calcification. Skull: Suggestion old nasal bone fracture. Sinuses/Orbits: Orbits are within normal. Complete opacification over the right maxillary sinus slightly worse. Other: None. IMPRESSION: No acute findings. Chronic ischemic microvascular disease and age related atrophic change.  Moderate chronic sinus inflammatory change of the right maxillary sinus slightly worse. Findings suggesting old nasal bone fracture. Electronically Signed   By: Marin Olp M.D.  On: 12/14/2018 16:25    Procedures Procedures (including critical care time)  Medications Ordered in ED Medications  sodium chloride 0.9 % bolus 500 mL (500 mLs Intravenous New Bag/Given 12/14/18 1542)     Initial Impression / Assessment and Plan / ED Course  I have reviewed the triage vital signs and the nursing notes.  Pertinent labs & imaging results that were available during my care of the patient were reviewed by me and considered in my medical decision making (see chart for details).        73 yo M with a chief complaint of decreased mental status.  He received about a minute of CPR and then arose.  He denies any chest pain currently.  Will screen for infection with altered mental status obtain a CT and had basic laboratory evaluation.  I reviewed the patient's MAR, he has been recently started on Benadryl for the itching to his face.  This may have caused him to be excessively sleepy and hard to arouse.  Patient also has a seizure disorder, this could have been a postictal period that he was noted to be less responsive as well.   The patient does have a positive troponin at 0.08.  I discussed the case with Dr. Burt Knack, cardiology based on my history and description of the EKG and his troponin he felt that no immediate intervention was required, will come and evaluate the patient at bedside.  As the patient had an event that required CPR, I will discuss the case with the hospitalist for likely overnight observation on telemetry with echo and serial tropes.  CRITICAL CARE Performed by: Cecilio Asper   Total critical care time: 35 minutes  Critical care time was exclusive of separately billable procedures and treating other patients.  Critical care was necessary to treat or prevent imminent  or life-threatening deterioration.  Critical care was time spent personally by me on the following activities: development of treatment plan with patient and/or surrogate as well as nursing, discussions with consultants, evaluation of patient's response to treatment, examination of patient, obtaining history from patient or surrogate, ordering and performing treatments and interventions, ordering and review of laboratory studies, ordering and review of radiographic studies, pulse oximetry and re-evaluation of patient's condition.  The patients results and plan were reviewed and discussed.   Any x-rays performed were independently reviewed by myself.   Differential diagnosis were considered with the presenting HPI.  Medications  sodium chloride 0.9 % bolus 500 mL (500 mLs Intravenous New Bag/Given 12/14/18 1542)    Vitals:   12/14/18 1423 12/14/18 1554 12/14/18 1700  BP: 100/69 128/78 137/67  Pulse: 66 62 65  Resp: 17 15 19   Temp: 98.2 F (36.8 C)    TempSrc: Oral    SpO2: 98% 98% 99%    Final diagnoses:  Transient alteration of awareness    Admission/ observation were discussed with the admitting physician, patient and/or family and they are comfortable with the plan.    Final Clinical Impressions(s) / ED Diagnoses   Final diagnoses:  Transient alteration of awareness    ED Discharge Orders    None       Deno Etienne, DO 12/14/18 1730

## 2018-12-14 NOTE — Progress Notes (Signed)
Received from ED, patient alert and oriented x3, easily goes back to sleep. Unable to weigh pt. Upon admission.

## 2018-12-14 NOTE — ED Triage Notes (Addendum)
Pt brought in by ems from [place] for c/o AMS ; according to facility they found him unresponsive and Laying in bed , per CNA at facility , patient did not have a pulse and initiated CPR for less than a minute , just compressions were done and got spontaneous return of pulse ; per facility , patient usually alert and talking and walking ; slight facial droop noted to the left side , unknown if this is normal for the patient ; unknown last well time ; pt alert but disoriented x 4 ; pt following simple commands ; hx of seizures

## 2018-12-14 NOTE — H&P (Addendum)
History and Physical    William [name] HWE:[id number] DOB: 1946/07/14 DOA: 12/14/2018  PCP: Patient, No Pcp Per   Patient coming from: Nursing home I have personally briefly reviewed patient's old medical records in [place]  Chief Complaint: Altered mental status  HPI: William [name] is a 73 y.o. male with medical history significant of probable dementia, hypertension, seizures, chronic kidney disease stage III, diabetes mellitus type 2 on insulin, alcohol abuse in the past, depression was present from nursing facility because of altered mental status.  Patient is slightly drowsy and is confused hence reliability of history is poor.  She does not know why he is in the hospital.  As per the ER provider's documentation, patient was apparently hard to arouse today in the nursing facility and CPR was done for less than a minute upon which the patient woke up and told them to stop.  Patient was sent to the ED.  Patient had complained of mild pain on the chest wall after that. There is no history of fever, chills, nausea or vomiting.  Currently patient denies any chest pain.  Apparently patient is being currently treated for new skin condition on the left side of his face with topical cream and oral Benadryl.  ED Course: Troponin was mildly positive at 0.08.  Cardiology was consulted.  CT of the head was negative for acute abnormality.  Hospitalist service was called to evaluate the patient.  Review of Systems: Could not be reviewed because of patient's current confusional state. Past Medical History:  Diagnosis Date  . Abnormality of gait 01/14/2016  . Alcohol abuse 2009   dependency as exhibited by ETOH withdrawal seizure.   . Alcohol withdrawal seizure ([place]) 2013  . Anemia 2011   Normocytic.   . Cataract   . Chronic renal insufficiency, stage III (moderate) ([place]) 02/26/2014  . Depression with anxiety   . Hearing impairment   . HTN (hypertension), benign 02/26/2014  .  Hyperlipemia   . Seizures ([place])   . Type II or unspecified type diabetes mellitus with neurological manifestations, not stated as uncontrolled(250.60) 02/26/2014    Past Surgical History:  Procedure Laterality Date  . NO PAST SURGERIES     Social history  reports that he has quit smoking. His smoking use included cigars. His smokeless tobacco use includes chew. He reports that he does not drink alcohol or use drugs.  Currently lives in a nursing home.  No Known Allergies  Family history: Could not be elicited because of confusional state  Prior to Admission medications   Medication Sig Start Date End Date Taking? Authorizing Provider  acetaminophen (TYLENOL) 650 MG CR tablet Take 650 mg by mouth every 8 (eight) hours.    [provider]  busPIRone (BUSPAR) 5 MG tablet Take 5 mg by mouth 2 (two) times daily.    [provider]  folic acid (FOLVITE) 1 MG tablet Take 1 tablet (1 mg total) by mouth daily. Patient not taking: Reported on 06/05/2018 05/07/15   Liberty Handy, MD  insulin aspart (NOVOLOG) 100 UNIT/ML injection Inject 10 Units into the skin 3 (three) times daily before meals. With meals if Blood Sugar is >180. Call MD if CBG >400.  Takes at Sundown, 1130, 1630    [provider]  Insulin Glargine (TOUJEO MAX SOLOSTAR) 300 UNIT/ML SOPN Inject 150 Units into the skin daily.    [provider]  ipratropium-albuterol (DUONEB) 0.5-2.5 (3) MG/3ML SOLN Take 3 mLs by nebulization every 6 (  six) hours as needed (for COPD).     [provider]  lisinopril (PRINIVIL,ZESTRIL) 5 MG tablet Take 5 mg by mouth daily.    [provider]  QUEtiapine (SEROQUEL) 100 MG tablet Take 100 mg by mouth 2 (two) times daily.     [provider]  sertraline (ZOLOFT) 100 MG tablet Take 100 mg by mouth at bedtime.    [provider]  valproic acid (DEPAKENE) 250 MG/5ML syrup Take 10 mLs (500 mg total) by mouth 3 (three) times daily. 01/02/16   Liberty Handy, MD    Physical Exam: Vitals:   12/14/18 1423 12/14/18 1554 12/14/18 1700  BP: 100/69 128/78 137/67  Pulse: 66 62 65  Resp: 17 15 19   Temp: 98.2 F (36.8 C)    TempSrc: Oral    SpO2: 98% 98% 99%    Constitutional: Elderly male lying in bed.  Sleepy, wakes up slightly on calling his name, tries to answer some questions.  Slow to respond.  No acute distress. Vitals:   12/14/18 1423 12/14/18 1554 12/14/18 1700  BP: 100/69 128/78 137/67  Pulse: 66 62 65  Resp: 17 15 19   Temp: 98.2 F (36.8 C)    TempSrc: Oral    SpO2: 98% 98% 99%   Eyes: PERRL, lids and conjunctivae normal ENMT: Left side of the face is erythematous.  Mucous membranes are dry.  Posterior pharynx clear of any exudate or lesions. Neck: normal, supple, no masses, no thyromegaly Respiratory: bilateral decreased breath sounds at bases, no wheezing, some scattered crackles crackles. Normal respiratory effort. No accessory muscle use.  Cardiovascular: S1 S2 positive, rate controlled. No extremity edema. 2+ pedal pulses.  Abdomen: no tenderness, no masses palpated. No hepatosplenomegaly. Bowel sounds positive.  Musculoskeletal: no clubbing / cyanosis. No joint deformity upper and lower extremities.  Skin: No other rash except for left facial erythema with some excoriations, lesions, ulcers. No induration Neurologic: Confused to time on waking up.  CN 2-12 grossly intact. Moving extremities.  Power in 4 out of 4 extremities he was 4/5; no focal neurologic deficits.  Psychiatric: Could not be assessed because of mental status   Labs on Admission: I have personally reviewed following labs and imaging studies  CBC: Recent Labs  Lab 12/14/18 1509 12/14/18 1523  WBC 8.6  --   NEUTROABS 6.2  --   HGB 12.8* 12.9*  HCT 40.6 38.0*  MCV 89.6  --   PLT 212  --    Basic Metabolic Panel: Recent Labs  Lab 12/14/18 1509 12/14/18 1523  NA 133* 131*  K 5.1 5.1  CL 100  --   CO2 20*  --   GLUCOSE 331*  --   BUN  53*  --   CREATININE 2.44*  --   CALCIUM 9.4  --    GFR: CrCl cannot be calculated (Unknown ideal weight.). Liver Function Tests: No results for input(s): AST, ALT, ALKPHOS, BILITOT, PROT, ALBUMIN in the last 168 hours. No results for input(s): LIPASE, AMYLASE in the last 168 hours. No results for input(s): AMMONIA in the last 168 hours. Coagulation Profile: No results for input(s): INR, PROTIME in the last 168 hours. Cardiac Enzymes: Recent Labs  Lab 12/14/18 1509  TROPONINI 0.08*   BNP (last 3 results) No results for input(s): PROBNP in the last 8760 hours. HbA1C: No results for input(s): HGBA1C in the last 72 hours. CBG: No results for input(s): GLUCAP in the last 168 hours. Lipid Profile: No results for input(s):  CHOL, HDL, LDLCALC, TRIG, CHOLHDL, LDLDIRECT in the last 72 hours. Thyroid Function Tests: No results for input(s): TSH, T4TOTAL, FREET4, T3FREE, THYROIDAB in the last 72 hours. Anemia Panel: No results for input(s): VITAMINB12, FOLATE, FERRITIN, TIBC, IRON, RETICCTPCT in the last 72 hours. Urine analysis:    Component Value Date/Time   COLORURINE STRAW (A) 06/05/2018 0136   APPEARANCEUR CLEAR 06/05/2018 0136   LABSPEC 1.010 06/05/2018 0136   PHURINE 5.0 06/05/2018 0136   GLUCOSEU >=500 (A) 06/05/2018 0136   GLUCOSEU 100 (A) 05/17/2009 2115   HGBUR NEGATIVE 06/05/2018 0136   BILIRUBINUR NEGATIVE 06/05/2018 0136   KETONESUR NEGATIVE 06/05/2018 0136   PROTEINUR NEGATIVE 06/05/2018 0136   UROBILINOGEN 1.0 05/04/2015 0418   NITRITE NEGATIVE 06/05/2018 0136   LEUKOCYTESUR NEGATIVE 06/05/2018 0136    Radiological Exams on Admission: Dg Chest 2 View  Result Date: 12/14/2018 CLINICAL DATA:  Chest pain.  Patient status post CPR today. EXAM: CHEST - 2 VIEW COMPARISON:  Single-view of the chest 12/30/2015. PA and lateral chest 11/19/2015. FINDINGS: Lung volumes are somewhat low with basilar atelectasis. No consolidative process, pneumothorax or pleural effusion.  Heart size is upper normal. Remote left rib fractures, midthoracic compression fracture and advanced left glenohumeral osteoarthritis noted. No acute bony abnormality. IMPRESSION: No acute disease. Electronically Signed   By: Inge Rise M.D.   On: 12/14/2018 15:36   Ct Head Wo Contrast  Result Date: 12/14/2018 CLINICAL DATA:  Altered mental status as patient found unresponsive in bed. EXAM: CT HEAD WITHOUT CONTRAST TECHNIQUE: Contiguous axial images were obtained from the base of the skull through the vertex without intravenous contrast. COMPARISON:  12/30/2015 FINDINGS: Brain: Ventricles, cisterns and other CSF spaces are unchanged. There is mild age related atrophic change. There is chronic ischemic microvascular disease. There is no mass, mass effect, shift of midline structures or acute hemorrhage. No evidence of acute infarction. Vascular: No hyperdense vessel or unexpected calcification. Skull: Suggestion old nasal bone fracture. Sinuses/Orbits: Orbits are within normal. Complete opacification over the right maxillary sinus slightly worse. Other: None. IMPRESSION: No acute findings. Chronic ischemic microvascular disease and age related atrophic change. Moderate chronic sinus inflammatory change of the right maxillary sinus slightly worse. Findings suggesting old nasal bone fracture. Electronically Signed   By: Marin Olp M.D.   On: 12/14/2018 16:25   EKG: Reviewed by myself showed normal sinus rhythm at 67 bpm.  No ST elevations or depressions.  Assessment/Plan Principal Problem:   Altered mental status, unspecified Active Problems:   Depression   Essential hypertension   Chronic renal insufficiency, stage III (moderate) (HCC)   Seizure (HCC)   Diabetes mellitus due to underlying condition, uncontrolled, with renal complication (HCC)   Elevated troponin   Altered mental status with unresponsiveness  -Unclear to what happened: Possibilities could be seizure versus excessive  sedation from Benadryl use. -EEG.  Monitor mental status.  CT of the brain was negative for any acute abnormality. -There is no overt signs of any stroke but if mental status does not improve, consider MRI of the brain  -will get PT/OT/SLP evaluation.  N.p.o. for now -Fall precautions.  Aspiration/seizure precautions -Vitamin F81, TSH, folic acid levels in a.m. along with ammonia -Hold Seroquel for tonight.  Concern for transient cardiac arrest with mildly positive troponin -Cardiology has been consulted. -Patient currently does not complain of any chest pain.  We will recycle troponins.  2D echo.  Follow further cardiology recommendations. -Monitor on telemetry.  History of seizures -Continue valproic acid.  Check valproic acid level in a.m.  EEG.  Chronic kidney disease stage III -Creatinine probably at baseline.  Will give gentle hydration tonight.  Monitor creatinine.  Probable dementia -Fall precautions.  Monitor mental status -Patient looks deconditioned and is not a candidate for further cardiology evaluation as per cardiology.  Will get palliative care consultation for goals of care discussion with family.  Hypertension -Monitor blood pressure.  Resume antihypertensives in a.m. if blood pressure allows  Diabetes mellitus type 2, uncontrolled with hyperglycemia -Use reduced dose of Lantus along with CBGs and exercise.  Check hemoglobin A1c.  Depression/anxiety -Continue sertraline and buspirone.   DVT prophylaxis: Heparin subcutaneous Code Status: Full Family Communication: None at bedside Disposition Plan: Probably back to SNF in 1 to 2 days if clinically stable Consults called: Cardiology by ED provider Admission status: Observation/telemetry  Severity of Illness: The appropriate patient status for this patient is OBSERVATION. Observation status is judged to be reasonable and necessary in order to provide the required intensity of service to ensure the patient's  safety. The patient's presenting symptoms, physical exam findings, and initial radiographic and laboratory data in the context of their medical condition is felt to place them at decreased risk for further clinical deterioration. Furthermore, it is anticipated that the patient will be medically stable for discharge from the hospital within 2 midnights of admission. The following factors support the patient status of observation.   " The patient's presenting symptoms include altered mental status/brief CPR. " The physical exam findings include confusion. " The initial radiographic and laboratory data are mildly positive troponin/CKD stage III/CT of the brain with no acute abnormality.      Aline August MD [facility]  12/14/2018, 5:49 PM

## 2018-12-15 ENCOUNTER — Observation Stay (HOSPITAL_COMMUNITY): Payer: Medicare Other

## 2018-12-15 ENCOUNTER — Observation Stay (HOSPITAL_BASED_OUTPATIENT_CLINIC_OR_DEPARTMENT_OTHER): Payer: Medicare Other

## 2018-12-15 DIAGNOSIS — Z515 Encounter for palliative care: Secondary | ICD-10-CM | POA: Diagnosis not present

## 2018-12-15 DIAGNOSIS — E0829 Diabetes mellitus due to underlying condition with other diabetic kidney complication: Secondary | ICD-10-CM

## 2018-12-15 DIAGNOSIS — R7989 Other specified abnormal findings of blood chemistry: Secondary | ICD-10-CM | POA: Diagnosis not present

## 2018-12-15 DIAGNOSIS — E0865 Diabetes mellitus due to underlying condition with hyperglycemia: Secondary | ICD-10-CM

## 2018-12-15 DIAGNOSIS — E785 Hyperlipidemia, unspecified: Secondary | ICD-10-CM | POA: Diagnosis present

## 2018-12-15 DIAGNOSIS — E119 Type 2 diabetes mellitus without complications: Secondary | ICD-10-CM | POA: Diagnosis present

## 2018-12-15 DIAGNOSIS — E1122 Type 2 diabetes mellitus with diabetic chronic kidney disease: Secondary | ICD-10-CM | POA: Diagnosis present

## 2018-12-15 DIAGNOSIS — N189 Chronic kidney disease, unspecified: Secondary | ICD-10-CM | POA: Diagnosis not present

## 2018-12-15 DIAGNOSIS — R278 Other lack of coordination: Secondary | ICD-10-CM | POA: Diagnosis present

## 2018-12-15 DIAGNOSIS — R4182 Altered mental status, unspecified: Secondary | ICD-10-CM | POA: Diagnosis present

## 2018-12-15 DIAGNOSIS — Z79899 Other long term (current) drug therapy: Secondary | ICD-10-CM | POA: Diagnosis not present

## 2018-12-15 DIAGNOSIS — L309 Dermatitis, unspecified: Secondary | ICD-10-CM | POA: Diagnosis present

## 2018-12-15 DIAGNOSIS — Z66 Do not resuscitate: Secondary | ICD-10-CM | POA: Diagnosis not present

## 2018-12-15 DIAGNOSIS — H9190 Unspecified hearing loss, unspecified ear: Secondary | ICD-10-CM | POA: Diagnosis present

## 2018-12-15 DIAGNOSIS — J9811 Atelectasis: Secondary | ICD-10-CM | POA: Diagnosis not present

## 2018-12-15 DIAGNOSIS — Z794 Long term (current) use of insulin: Secondary | ICD-10-CM | POA: Diagnosis not present

## 2018-12-15 DIAGNOSIS — F329 Major depressive disorder, single episode, unspecified: Secondary | ICD-10-CM | POA: Diagnosis not present

## 2018-12-15 DIAGNOSIS — M6281 Muscle weakness (generalized): Secondary | ICD-10-CM | POA: Diagnosis present

## 2018-12-15 DIAGNOSIS — R1311 Dysphagia, oral phase: Secondary | ICD-10-CM | POA: Diagnosis present

## 2018-12-15 DIAGNOSIS — I130 Hypertensive heart and chronic kidney disease with heart failure and stage 1 through stage 4 chronic kidney disease, or unspecified chronic kidney disease: Secondary | ICD-10-CM | POA: Diagnosis present

## 2018-12-15 DIAGNOSIS — D631 Anemia in chronic kidney disease: Secondary | ICD-10-CM | POA: Diagnosis present

## 2018-12-15 DIAGNOSIS — E669 Obesity, unspecified: Secondary | ICD-10-CM | POA: Diagnosis present

## 2018-12-15 DIAGNOSIS — I5043 Acute on chronic combined systolic (congestive) and diastolic (congestive) heart failure: Secondary | ICD-10-CM | POA: Diagnosis present

## 2018-12-15 DIAGNOSIS — F419 Anxiety disorder, unspecified: Secondary | ICD-10-CM | POA: Diagnosis present

## 2018-12-15 DIAGNOSIS — N12 Tubulo-interstitial nephritis, not specified as acute or chronic: Secondary | ICD-10-CM | POA: Diagnosis present

## 2018-12-15 DIAGNOSIS — I10 Essential (primary) hypertension: Secondary | ICD-10-CM | POA: Diagnosis present

## 2018-12-15 DIAGNOSIS — E1165 Type 2 diabetes mellitus with hyperglycemia: Secondary | ICD-10-CM | POA: Diagnosis present

## 2018-12-15 DIAGNOSIS — E1149 Type 2 diabetes mellitus with other diabetic neurological complication: Secondary | ICD-10-CM | POA: Diagnosis present

## 2018-12-15 DIAGNOSIS — R55 Syncope and collapse: Secondary | ICD-10-CM | POA: Diagnosis not present

## 2018-12-15 DIAGNOSIS — I129 Hypertensive chronic kidney disease with stage 1 through stage 4 chronic kidney disease, or unspecified chronic kidney disease: Secondary | ICD-10-CM | POA: Diagnosis not present

## 2018-12-15 DIAGNOSIS — R062 Wheezing: Secondary | ICD-10-CM | POA: Diagnosis not present

## 2018-12-15 DIAGNOSIS — Z6833 Body mass index (BMI) 33.0-33.9, adult: Secondary | ICD-10-CM | POA: Diagnosis not present

## 2018-12-15 DIAGNOSIS — G40909 Epilepsy, unspecified, not intractable, without status epilepticus: Secondary | ICD-10-CM | POA: Diagnosis present

## 2018-12-15 DIAGNOSIS — F17220 Nicotine dependence, chewing tobacco, uncomplicated: Secondary | ICD-10-CM | POA: Diagnosis present

## 2018-12-15 DIAGNOSIS — J209 Acute bronchitis, unspecified: Secondary | ICD-10-CM | POA: Diagnosis not present

## 2018-12-15 DIAGNOSIS — E871 Hypo-osmolality and hyponatremia: Secondary | ICD-10-CM | POA: Diagnosis not present

## 2018-12-15 DIAGNOSIS — N183 Chronic kidney disease, stage 3 (moderate): Secondary | ICD-10-CM | POA: Diagnosis not present

## 2018-12-15 DIAGNOSIS — Z7401 Bed confinement status: Secondary | ICD-10-CM | POA: Diagnosis not present

## 2018-12-15 DIAGNOSIS — N179 Acute kidney failure, unspecified: Secondary | ICD-10-CM | POA: Diagnosis present

## 2018-12-15 DIAGNOSIS — M2550 Pain in unspecified joint: Secondary | ICD-10-CM | POA: Diagnosis not present

## 2018-12-15 DIAGNOSIS — R2689 Other abnormalities of gait and mobility: Secondary | ICD-10-CM | POA: Diagnosis present

## 2018-12-15 DIAGNOSIS — R0602 Shortness of breath: Secondary | ICD-10-CM | POA: Diagnosis not present

## 2018-12-15 DIAGNOSIS — F0391 Unspecified dementia with behavioral disturbance: Secondary | ICD-10-CM | POA: Diagnosis present

## 2018-12-15 DIAGNOSIS — F333 Major depressive disorder, recurrent, severe with psychotic symptoms: Secondary | ICD-10-CM | POA: Diagnosis present

## 2018-12-15 DIAGNOSIS — E875 Hyperkalemia: Secondary | ICD-10-CM | POA: Diagnosis not present

## 2018-12-15 DIAGNOSIS — R404 Transient alteration of awareness: Secondary | ICD-10-CM | POA: Diagnosis not present

## 2018-12-15 LAB — GLUCOSE, CAPILLARY
GLUCOSE-CAPILLARY: 179 mg/dL — AB (ref 70–99)
GLUCOSE-CAPILLARY: 165 mg/dL — AB (ref 70–99)
Glucose-Capillary: 210 mg/dL — ABNORMAL HIGH (ref 70–99)
Glucose-Capillary: 114 mg/dL — ABNORMAL HIGH (ref 70–99)

## 2018-12-15 LAB — COMPREHENSIVE METABOLIC PANEL
ALK PHOS: 65 U/L (ref 38–126)
ALT: 17 U/L (ref 0–44)
AST: 11 U/L — ABNORMAL LOW (ref 15–41)
Albumin: 3.4 g/dL — ABNORMAL LOW (ref 3.5–5.0)
Anion gap: 12 (ref 5–15)
BUN: 56 mg/dL — ABNORMAL HIGH (ref 8–23)
CO2: 23 mmol/L (ref 22–32)
Calcium: 9.2 mg/dL (ref 8.9–10.3)
Chloride: 102 mmol/L (ref 98–111)
Creatinine, Ser: 2.83 mg/dL — ABNORMAL HIGH (ref 0.61–1.24)
GFR calc Af Amer: 24 mL/min — ABNORMAL LOW (ref >60)
GFR calc non Af Amer: 21 mL/min — ABNORMAL LOW (ref >60)
Glucose, Bld: 205 mg/dL — ABNORMAL HIGH (ref 70–99)
Potassium: 4.8 mmol/L (ref 3.5–5.1)
SODIUM: 137 mmol/L (ref 135–145)
TOTAL PROTEIN: 6 g/dL — AB (ref 6.5–8.1)
Total Bilirubin: 0.7 mg/dL (ref 0.3–1.2)

## 2018-12-15 LAB — TROPONIN I
Troponin I: 0.07 ng/mL (ref <0.03)
Troponin I: 0.07 ng/mL (ref <0.03)

## 2018-12-15 LAB — URINALYSIS, ROUTINE W REFLEX MICROSCOPIC
Bilirubin Urine: NEGATIVE
GLUCOSE, UA: 50 mg/dL — AB
Ketones, ur: NEGATIVE mg/dL
NITRITE: NEGATIVE
PH: 6 (ref 5.0–8.0)
Protein, ur: 30 mg/dL — AB
Specific Gravity, Urine: 1.006 (ref 1.005–1.030)
WBC, UA: >50 WBC/hpf — ABNORMAL HIGH (ref 0–5)

## 2018-12-15 LAB — CBC
HCT: 40.4 % (ref 39.0–52.0)
HEMOGLOBIN: 13.2 g/dL (ref 13.0–17.0)
MCH: 29.2 pg (ref 26.0–34.0)
MCHC: 32.7 g/dL (ref 30.0–36.0)
MCV: 89.4 fL (ref 80.0–100.0)
PLATELETS: 187 10*3/uL (ref 150–400)
RBC: 4.52 MIL/uL (ref 4.22–5.81)
RDW: 14.4 % (ref 11.5–15.5)
WBC: 6.7 10*3/uL (ref 4.0–10.5)
nRBC: 0 % (ref 0.0–0.2)

## 2018-12-15 LAB — MAGNESIUM: Magnesium: 1.8 mg/dL (ref 1.7–2.4)

## 2018-12-15 LAB — HEMOGLOBIN A1C
Hgb A1c MFr Bld: 7.6 % — ABNORMAL HIGH (ref 4.8–5.6)
Mean Plasma Glucose: 171.42 mg/dL

## 2018-12-15 LAB — ECHOCARDIOGRAM COMPLETE: WEIGHTICAEL: 3488 [oz_av]

## 2018-12-15 LAB — VITAMIN B12: VITAMIN B 12: 667 pg/mL (ref 180–914)

## 2018-12-15 LAB — VALPROIC ACID LEVEL: Valproic Acid Lvl: 29 ug/mL — ABNORMAL LOW (ref 50.0–100.0)

## 2018-12-15 LAB — TSH: TSH: 2.113 u[IU]/mL (ref 0.350–4.500)

## 2018-12-15 LAB — FOLATE: Folate: 3 ng/mL — ABNORMAL LOW (ref >5.9)

## 2018-12-15 LAB — AMMONIA: Ammonia: 30 umol/L (ref 9–35)

## 2018-12-15 MED ORDER — LORAZEPAM 1 MG PO TABS
2.0000 mg | ORAL_TABLET | Freq: Once | ORAL | Status: AC
  Administered 2018-12-15: 2 mg via ORAL
  Filled 2018-12-15: qty 2

## 2018-12-15 MED ORDER — CETAPHIL MOISTURIZING EX LOTN
TOPICAL_LOTION | Freq: Two times a day (BID) | CUTANEOUS | Status: DC
  Administered 2018-12-15 – 2018-12-18 (×7): via TOPICAL
  Administered 2018-12-18: 1 via TOPICAL
  Administered 2018-12-19: 10:00:00 via TOPICAL
  Administered 2018-12-19: 1 via TOPICAL
  Administered 2018-12-20: 09:00:00 via TOPICAL
  Administered 2018-12-20: 1 via TOPICAL
  Administered 2018-12-21 (×2): via TOPICAL
  Administered 2018-12-21: 1 via TOPICAL
  Administered 2018-12-22 (×2): via TOPICAL
  Administered 2018-12-23: 1 via TOPICAL
  Administered 2018-12-23 – 2018-12-24 (×2): via TOPICAL
  Filled 2018-12-15: qty 473

## 2018-12-15 MED ORDER — LEVETIRACETAM 250 MG PO TABS
250.0000 mg | ORAL_TABLET | Freq: Two times a day (BID) | ORAL | Status: DC
  Administered 2018-12-15 – 2018-12-24 (×18): 250 mg via ORAL
  Filled 2018-12-15 (×20): qty 1

## 2018-12-15 MED ORDER — HYDROXYZINE HCL 10 MG PO TABS: 10.0000 mg | ORAL_TABLET | Freq: Three times a day (TID) | ORAL | Status: DC | PRN

## 2018-12-15 MED ORDER — HYDROXYZINE HCL 10 MG/5ML PO SYRP
10.0000 mg | ORAL_SOLUTION | Freq: Three times a day (TID) | ORAL | Status: DC | PRN
  Filled 2018-12-15: qty 5

## 2018-12-15 NOTE — Progress Notes (Addendum)
Pt. refusing to put tele back on, pulled IV out. yelling at staff. MD made aware. PO ativan given.

## 2018-12-15 NOTE — Consult Note (Signed)
Consultation Note Date: 12/15/2018   Patient Name: William [name]  DOB: 12-31-1945  MRN: [id number]  Age / Sex: 73 y.o., male  PCP: Patient, No Pcp Per Referring Physician: Oswald Hillock, MD  Reason for Consultation: Establishing goals of care  HPI/Patient Profile: 73 y.o. male  with past medical history of seizures dementia chronic kidney disease stage III history of alcohol abuse and type 2 diabetes who was admitted on 12/14/2018 with altered mental status.  He had had a period of unresponsiveness at his skilled nursing facility.  He was thought to be without a pulse.  CPR was initiated but quickly stopped as he became responsive.  In the emergency department he was found to have a mildly elevated troponin and acute on chronic renal failure.  Since admission he has been followed by cardiology who feels that he likely did not have an arrest and at this point no further work-up is warranted.  An echocardiogram was completed that showed an LVEF of 40 to 45% but no significant valvular abnormalities.  Clinical Assessment and Goals of Care:  I spoke with William [name] at bedside.  He was pleasant but cursing frequently in his conversation.  He did not like having the IV in his arm.  He wanted the bed rails lowered.  My understanding is that he has been a little bit difficult to care for from a nursing and nurse tech perspective.  He is mildly confused. He does not appear to have capacity for complex decision-making.  I talked with him about who supports him and would make medical decisions for him if he could not speak for himself.  He responded with 'that woman', but was unable to give me a name. He tells me he is eating well and able to walk outside with a walker when the weather is good.  After a few minutes of speaking with him I left the room and attempted to call the contacts on his face sheet including William [name] and  Delphina Cahill.  I left voicemail messages at both numbers and will await a call back.  Primary Decision Maker:  OTHER significant other of 40+ years William [name].    SUMMARY OF RECOMMENDATIONS    Palliative medicine team will continue to try to reach the patient's contacts listed on facesheet.  However my sense of him is that he does not want to be restricted by bed rails, or have IVs in his arm or have to follow rules of the hospital.  Hence, I would recommend discharge back to his facility as soon as medically appropriate.  Palliative care to follow outpatient.  Please put in your discharge summary "palliative care to follow outpatient "under discharge follow-up instructions  He has a significant rash on his head, left side of his face including eyelids, and down his right arm.  He is unable to tell me how long he has had this rash, but does complain of itching.  Code Status/Advance Care Planning:  Full  Symptom management: Please consider cortisone cream  for L arm, chest, and head.  Am concerned about his eye lid.  Will defer placing any orders to Dr. Darrick Meigs.   Prognosis: Unable to determine    Discharge Planning: [place] for rehab with Palliative care service follow-up      Primary Diagnoses: Present on Admission: . Altered mental status, unspecified . Essential hypertension . Chronic renal insufficiency, stage III (moderate) (HCC) . Diabetes mellitus due to underlying condition, uncontrolled, with renal complication ([place]) . Elevated troponin . Depression   I have reviewed the medical record, interviewed the patient and family, and examined the patient. The following aspects are pertinent.  Past Medical History:  Diagnosis Date  . Abnormality of gait 01/14/2016  . Alcohol abuse 2009   dependency as exhibited by ETOH withdrawal seizure.   . Alcohol withdrawal seizure ([place]) 2013  . Anemia 2011   Normocytic.   . Cataract   . Chronic renal  insufficiency, stage III (moderate) ([place]) 02/26/2014  . Depression with anxiety   . Hearing impairment   . HTN (hypertension), benign 02/26/2014  . Hyperlipemia   . Seizures ([place])   . Type II or unspecified type diabetes mellitus with neurological manifestations, not stated as uncontrolled(250.60) 02/26/2014   Social History   Socioeconomic History  . Marital status: Single    Spouse name: Not on file  . Number of children: 0  . Years of education: Not on file  . Highest education level: Not on file  Occupational History  . Not on file  Social Needs  . Financial resource strain: Not on file  . Food insecurity:    Worry: Not on file    Inability: Not on file  . Transportation needs:    Medical: Not on file    Non-medical: Not on file  Tobacco Use  . Smoking status: Former Smoker    Types: Cigars  . Smokeless tobacco: Current User    Types: Chew  Substance and Sexual Activity  . Alcohol use: No    Alcohol/week: 0.0 standard drinks    Comment: 04/2015 I have not drank in over a month "  . Drug use: No  . Sexual activity: Not Currently  Lifestyle  . Physical activity:    Days per week: Not on file    Minutes per session: Not on file  . Stress: Not on file  Relationships  . Social connections:    Talks on phone: Not on file    Gets together: Not on file    Attends religious service: Not on file    Active member of club or organization: Not on file    Attends meetings of clubs or organizations: Not on file    Relationship status: Not on file  Other Topics Concern  . Not on file  Social History Narrative   ** Merged History Encounter **[facility]          Family History  Family history unknown: Yes   Scheduled Meds: . busPIRone  5 mg Oral BID  . Chlorhexidine Gluconate Cloth  6 each Topical Q0600  . heparin  5,000 Units Subcutaneous Q8H  . insulin aspart  0-15 Units Subcutaneous TID WC  . insulin aspart  0-5 Units Subcutaneous QHS  . insulin glargine  50  Units Subcutaneous Daily  . lisinopril  5 mg Oral Daily  . mupirocin ointment  1 application Nasal BID  . QUEtiapine  100 mg Oral BID  . sertraline  100 mg Oral QHS  . valproic acid  500 mg Oral TID   Continuous Infusions: . sodium chloride 75 mL/hr at 12/15/18 0600   PRN Meds:.acetaminophen **OR** acetaminophen, ipratropium-albuterol, ondansetron **OR** ondansetron (ZOFRAN) IV, senna-docusate No Known Allergies Review of Systems:  No pain, SOB, he complains of itching on the top of his head.  Physical Exam  Well developed male awake, alert, cooperative but cursing. Confluent rash noted on head, L face (and eyelid), becomes erythematous papules down left arm.  Vital Signs: BP 140/87 (BP Location: Right Arm)   Pulse 68   Temp 97.7 F (36.5 C) (Oral)   Resp 20   Wt 98.9 kg   SpO2 98%   BMI 33.15 kg/m  Pain Scale: 0-10   Pain Score: 0-No pain   SpO2: SpO2: 98 % O2 Device:SpO2: 98 % O2 Flow Rate: .   IO: Intake/output summary:   Intake/Output Summary (Last 24 hours) at 12/15/2018 1408 Last data filed at 12/15/2018 0800 Gross per 24 hour  Intake 695.3 ml  Output 500 ml  Net 195.3 ml    LBM: Last BM Date: (unsure) Baseline Weight: Weight: 98.9 kg Most recent weight: Weight: 98.9 kg     Palliative Assessment/Data: 50%   Flowsheet Rows     Most Recent Value  Intake Tab  Referral Department  Hospitalist  Unit at Time of Referral  ER  Date Notified  12/15/18  Palliative Care Type  New Palliative care  Reason for referral  Clarify Goals of Care  Date of Admission  12/14/18  # of days IP prior to Palliative referral  1  Clinical Assessment  Psychosocial & Spiritual Assessment  Palliative Care Outcomes      Time In: 11:00 Time Out: 11:30 Time Total: 30 min. Greater than 50%  of this time was spent counseling and coordinating care related to the above assessment and plan.  Signed by: Florentina Jenny, PA-C Palliative Medicine Pager: [phone]  Please  contact Palliative Medicine Team phone at [phone] for questions and concerns.  For individual provider: See Shea Evans

## 2018-12-15 NOTE — Evaluation (Signed)
Clinical/Bedside Swallow Evaluation Patient Details  Name: William [name] MRN: [id number] Date of Birth: 09-Dec-1945  Today's Date: 12/15/2018 Time: SLP Start Time (ACUTE ONLY): 1104 SLP Stop Time (ACUTE ONLY): 1117 SLP Time Calculation (min) (ACUTE ONLY): 13 min  Past Medical History:  Past Medical History:  Diagnosis Date  . Abnormality of gait 01/14/2016  . Alcohol abuse 2009   dependency as exhibited by ETOH withdrawal seizure.   . Alcohol withdrawal seizure ([place]) 2013  . Anemia 2011   Normocytic.   . Cataract   . Chronic renal insufficiency, stage III (moderate) ([place]) 02/26/2014  . Depression with anxiety   . Hearing impairment   . HTN (hypertension), benign 02/26/2014  . Hyperlipemia   . Seizures ([place])   . Type II or unspecified type diabetes mellitus with neurological manifestations, not stated as uncontrolled(250.60) 02/26/2014   Past Surgical History:  Past Surgical History:  Procedure Laterality Date  . NO PAST SURGERIES     HPI:  Pt is a 73 yo male admitted from SNF for unresponsiveness. Brief CPR was done (<1 min). CT Head negative; additional w/u pending. CXR showed no acute disease. PMH includes: DM, seizures, HTN, hearing impairment, cataract, alcohol abuse, CKD, probable dementia   Assessment / Plan / Recommendation Clinical Impression  Pt had a single delayed cough, but with no other overt signs of aspiration. RN reported no coughing with thin liquids while receiving meds. He has prolonged mastication but ultimately good oral clearance with regular solids, which may be baseline given that he has no dentition. Recommend initiating Dys 3 diet and thin liquids with additional, likely brief, SLP f/u to assess for tolerance. SLP Visit Diagnosis: Dysphagia, unspecified (R13.10)    Aspiration Risk  Mild aspiration risk    Diet Recommendation Dysphagia 3 (Mech soft);Thin liquid   Liquid Administration via: Cup;Straw Medication Administration: Whole meds with  liquid Supervision: Patient able to self feed;Intermittent supervision to cue for compensatory strategies Compensations: Slow rate;Small sips/bites Postural Changes: Seated upright at 90 degrees    Other  Recommendations Oral Care Recommendations: Oral care BID   Follow up Recommendations None      Frequency and Duration min 2x/week  1 week       Prognosis Prognosis for Safe Diet Advancement: Good      Swallow Study   General HPI: Pt is a 73 yo male admitted from SNF for unresponsiveness. Brief CPR was done (<1 min). CT Head negative; additional w/u pending. CXR showed no acute disease. PMH includes: DM, seizures, HTN, hearing impairment, cataract, alcohol abuse, CKD, probable dementia Type of Study: Bedside Swallow Evaluation Previous Swallow Assessment: none in chart Diet Prior to this Study: NPO Temperature Spikes Noted: No Respiratory Status: Room air History of Recent Intubation: No Behavior/Cognition: Alert;Cooperative Oral Cavity Assessment: Within Functional Limits Oral Care Completed by SLP: No Oral Cavity - Dentition: Edentulous Vision: Functional for self-feeding Self-Feeding Abilities: Able to feed self Patient Positioning: Upright in bed Baseline Vocal Quality: Normal Volitional Cough: Strong Volitional Swallow: Able to elicit    Oral/Motor/Sensory Function Overall Oral Motor/Sensory Function: Within functional limits   Ice Chips Ice chips: Not tested   Thin Liquid Thin Liquid: Impaired Presentation: Self Fed;Straw Pharyngeal  Phase Impairments: Cough - Delayed    Nectar Thick Nectar Thick Liquid: Not tested   Honey Thick Honey Thick Liquid: Not tested   Puree Puree: Within functional limits Presentation: Spoon   Solid     Solid: Impaired Presentation: Self Fed Oral Phase Impairments: Impaired mastication  William [name] 12/15/2018,11:30 AM   William [name], M.A. [place] Acute Environmental education officer [phone] Office  [phone]

## 2018-12-15 NOTE — Progress Notes (Signed)
SLP Cancellation Note  Patient Details Name: William [name] MRN: [id number] DOB: 1946/07/07   Cancelled treatment:       Reason Eval/Treat Not Completed: Patient unavailable - currently getting set up for EEG in room. WIll f/u as able.   [name] 12/15/2018, 8:38 AM  [name], M.A. [place] Acute Environmental education officer [phone] Office [phone]

## 2018-12-15 NOTE — Progress Notes (Signed)
PT Cancellation Note  Patient Details Name: LAMBERT JEANTY MRN: [id number] DOB: Apr 27, 1946   Cancelled Treatment:    Reason Eval/Treat Not Completed: Patient at procedure or test/unavailable (EEG). Will follow-up for PT evaluation as schedule permits.  Mabeline Caras, PT, DPT Acute Rehabilitation Services  Pager [phone] Office [place] 12/15/2018, 8:41 AM

## 2018-12-15 NOTE — Evaluation (Addendum)
Physical Therapy Evaluation Patient Details Name: William [name] MRN: [id number] DOB: 12-26-1945 Today's Date: 12/15/2018   History of Present Illness  Pt is a 73 y.o. long-term resident at [facility] admitted on 12/14/18 with unresponsiveness requiring brief CPR (<1 min). Head CT negative for acute findings; additional workup pedning. CXR without acute disease. PMH includes DM, seizures, HTN, hearing impairment, cataracts, CKD, alcohol abuse, probable dementia.    Clinical Impression  Pt presents with an overall decrease in functional mobility secondary to above. PTA, pt long-term SNF resident, reports uses RW or wheelchair for mobility. Today, pt able to stand and take steps with RW and min guard to minA; required assist for ADLs. Pt with baseline cognitive impairments, intermittently confused and difficult to redirect to task. Will follow acutely to address established goals.    Follow Up Recommendations SNF;Supervision/Assistance - 24 hour    Equipment Recommendations  None recommended by PT    Recommendations for Other Services       Precautions / Restrictions Precautions Precautions: Fall Restrictions Weight Bearing Restrictions: No      Mobility  Bed Mobility Overal bed mobility: Needs Assistance Bed Mobility: Supine to Sit;Sit to Supine     Supine to sit: Supervision Sit to supine: Supervision      Transfers Overall transfer level: Needs assistance Equipment used: Rolling walker (2 wheeled) Transfers: Sit to/from Stand Sit to Stand: Min assist;From elevated surface         General transfer comment: MinA to assist trunk elevation  Ambulation/Gait Ambulation/Gait assistance: Min guard Gait Distance (Feet): 2 Feet Assistive device: Rolling walker (2 wheeled) Gait Pattern/deviations: Step-to pattern;Trunk flexed Gait velocity: Decreased   General Gait Details: Tooks steps at EOB with RW and min guard for balance; pt becoming agitated and unable to be  redirected, deferred further mobility  Stairs            Wheelchair Mobility    Modified Rankin (Stroke Patients Only)       Balance Overall balance assessment: Needs assistance   Sitting balance-Leahy Scale: Fair       Standing balance-Leahy Scale: Poor Standing balance comment: Reliant on UE support; dependent for pericare while standing. Pt unaware of bowel incontinence                             Pertinent Vitals/Pain Pain Assessment: No/denies pain    Home Living Family/patient expects to be discharged to:: Skilled nursing facility                 Additional Comments: Pt poor historian but knows he lives at a nursing home. Per chart, pt is longterm resident at [facility]    Prior Function Level of Independence: Needs assistance   Gait / Transfers Assistance Needed: Poor historian. Reports use of rollator and/or wheelchair, unclear of when he uses which           Hand Dominance        Extremity/Trunk Assessment   Upper Extremity Assessment Upper Extremity Assessment: Overall WFL for tasks assessed    Lower Extremity Assessment Lower Extremity Assessment: Overall WFL for tasks assessed       Communication   Communication: HOH  Cognition Arousal/Alertness: Awake/alert Behavior During Therapy: WFL for tasks assessed/performed;Agitated Overall Cognitive Status: History of cognitive impairments - at baseline  General Comments: Per chart, h/o dementia; also very HOH. Pt answering majority of questions appropriately and following simple commands, but easily becomes agitated and verbally aggressive. Able to be somewhat redirected, althouhh this is inconsistent      General Comments      Exercises     Assessment/Plan    PT Assessment Patient needs continued PT services  PT Problem List Decreased strength;Decreased activity tolerance;Decreased balance;Decreased mobility;Decreased  cognition;Decreased knowledge of use of DME;Decreased safety awareness       PT Treatment Interventions DME instruction;Gait training;Functional mobility training;Therapeutic activities;Therapeutic exercise;Balance training;Patient/family education    PT Goals (Current goals can be found in the Care Plan section)  Acute Rehab PT Goals Patient Stated Goal: "Get me a coffee" PT Goal Formulation: With patient Time For Goal Achievement: 12/29/18 Potential to Achieve Goals: Good    Frequency Min 2X/week   Barriers to discharge        Co-evaluation               AM-PAC PT "6 Clicks" Mobility  Outcome Measure Help needed turning from your back to your side while in a flat bed without using bedrails?: A Little Help needed moving from lying on your back to sitting on the side of a flat bed without using bedrails?: A Little Help needed moving to and from a bed to a chair (including a wheelchair)?: A Little Help needed standing up from a chair using your arms (e.g., wheelchair or bedside chair)?: A Little Help needed to walk in hospital room?: A Little Help needed climbing 3-5 steps with a railing? : A Lot 6 Click Score: 17    End of Session Equipment Utilized During Treatment: Gait belt Activity Tolerance: Other (comment) Patient left: in bed;with call bell/phone within reach;with bed alarm set Nurse Communication: Mobility status PT Visit Diagnosis: Other abnormalities of gait and mobility (R26.89)    Time: 4975-3005 PT Time Calculation (min) (ACUTE ONLY): 16 min   Charges:   PT Evaluation $PT Eval Moderate Complexity: [place], PT, DPT Acute Rehabilitation Services  Pager [phone] Office [place] 12/15/2018, 2:34 PM

## 2018-12-15 NOTE — Clinical Social Work Note (Signed)
Patient is a long-term resident at [facility]. Left message for patient's cousin, Briscoe Deutscher. Will discuss return to SNF and MOON letter when she returns call.  Dayton Scrape, [place]

## 2018-12-15 NOTE — Progress Notes (Addendum)
Progress Note  Patient Name: William [name] Date of Encounter: 12/15/2018  Primary Cardiologist: No primary care provider on file.   Subjective   Laying in bed. Wants to eat.   Inpatient Medications    Scheduled Meds: . busPIRone  5 mg Oral BID  . Chlorhexidine Gluconate Cloth  6 each Topical Q0600  . heparin  5,000 Units Subcutaneous Q8H  . insulin aspart  0-15 Units Subcutaneous TID WC  . insulin aspart  0-5 Units Subcutaneous QHS  . insulin glargine  50 Units Subcutaneous Daily  . lisinopril  5 mg Oral Daily  . mupirocin ointment  1 application Nasal BID  . QUEtiapine  100 mg Oral BID  . sertraline  100 mg Oral QHS  . valproic acid  500 mg Oral TID   Continuous Infusions: . sodium chloride 75 mL/hr at 12/15/18 0600   PRN Meds: acetaminophen **OR** acetaminophen, ipratropium-albuterol, ondansetron **OR** ondansetron (ZOFRAN) IV, senna-docusate   Vital Signs    Vitals:   12/14/18 2054 12/15/18 0036 12/15/18 0539 12/15/18 0757  BP: (!) 123/91 137/87 123/81 (!) 151/82  Pulse: 60 64 62 70  Resp: 18 20 18 19   Temp: (!) 97.5 F (36.4 C) 97.8 F (36.6 C) 98 F (36.7 C) 98.1 F (36.7 C)  TempSrc: Oral Oral  Oral  SpO2: 98% 97% 97% 97%  Weight:  98.9 kg      Intake/Output Summary (Last 24 hours) at 12/15/2018 1031 Last data filed at 12/15/2018 0800 Gross per 24 hour  Intake 695.3 ml  Output 500 ml  Net 195.3 ml   Last 3 Weights 12/15/2018 06/04/2018 01/13/2018  Weight (lbs) 218 lb 234 lb 255 lb 12.8 oz  Weight (kg) 98.884 kg 106.142 kg 116.03 kg  Some encounter information is confidential and restricted. Go to Review Flowsheets activity to see all data.      Telemetry    SR - Personally Reviewed  ECG    N/a - Personally Reviewed  Physical Exam   GEN: No acute distress.   Neck: No JVD Cardiac: RRR, no murmurs, rubs, or gallops.  Respiratory: Clear to auscultation bilaterally. GI: Soft, nontender, non-distended  MS: No edema; No deformity. Neuro:   Nonfocal  Psych: Normal affect   Labs    Chemistry Recent Labs  Lab 12/14/18 1509 12/14/18 1523 12/15/18 0522  NA 133* 131* 137  K 5.1 5.1 4.8  CL 100  --  102  CO2 20*  --  23  GLUCOSE 331*  --  205*  BUN 53*  --  56*  CREATININE 2.44*  --  2.83*  CALCIUM 9.4  --  9.2  PROT  --   --  6.0*  ALBUMIN  --   --  3.4*  AST  --   --  11*  ALT  --   --  17  ALKPHOS  --   --  65  BILITOT  --   --  0.7  GFRNONAA 25*  --  21*  GFRAA 29*  --  24*  ANIONGAP 13  --  12     Hematology Recent Labs  Lab 12/14/18 1509 12/14/18 1523 12/15/18 0522  WBC 8.6  --  6.7  RBC 4.53  --  4.52  HGB 12.8* 12.9* 13.2  HCT 40.6 38.0* 40.4  MCV 89.6  --  89.4  MCH 28.3  --  29.2  MCHC 31.5  --  32.7  RDW 14.0  --  14.4  PLT 212  --  187    Cardiac Enzymes Recent Labs  Lab 12/14/18 1509 12/14/18 1921 12/14/18 2330 12/15/18 0522  TROPONINI 0.08* 0.09* 0.07* 0.07*   No results for input(s): TROPIPOC in the last 168 hours.   BNPNo results for input(s): BNP, PROBNP in the last 168 hours.   DDimer No results for input(s): DDIMER in the last 168 hours.   Radiology    Dg Chest 2 View  Result Date: 12/14/2018 CLINICAL DATA:  Chest pain.  Patient status post CPR today. EXAM: CHEST - 2 VIEW COMPARISON:  Single-view of the chest 12/30/2015. PA and lateral chest 11/19/2015. FINDINGS: Lung volumes are somewhat low with basilar atelectasis. No consolidative process, pneumothorax or pleural effusion. Heart size is upper normal. Remote left rib fractures, midthoracic compression fracture and advanced left glenohumeral osteoarthritis noted. No acute bony abnormality. IMPRESSION: No acute disease. Electronically Signed   By: Inge Rise M.D.   On: 12/14/2018 15:36   Ct Head Wo Contrast  Result Date: 12/14/2018 CLINICAL DATA:  Altered mental status as patient found unresponsive in bed. EXAM: CT HEAD WITHOUT CONTRAST TECHNIQUE: Contiguous axial images were obtained from the base of the skull  through the vertex without intravenous contrast. COMPARISON:  12/30/2015 FINDINGS: Brain: Ventricles, cisterns and other CSF spaces are unchanged. There is mild age related atrophic change. There is chronic ischemic microvascular disease. There is no mass, mass effect, shift of midline structures or acute hemorrhage. No evidence of acute infarction. Vascular: No hyperdense vessel or unexpected calcification. Skull: Suggestion old nasal bone fracture. Sinuses/Orbits: Orbits are within normal. Complete opacification over the right maxillary sinus slightly worse. Other: None. IMPRESSION: No acute findings. Chronic ischemic microvascular disease and age related atrophic change. Moderate chronic sinus inflammatory change of the right maxillary sinus slightly worse. Findings suggesting old nasal bone fracture. Electronically Signed   By: Marin Olp M.D.   On: 12/14/2018 16:25    Cardiac Studies   TTE: pending  Patient Profile     73 y.o. male with a hx of dementia who was seen for the evaluation of unresponsiveness/possible cardiac arrest at the request of Dr Tyrone Nine.   Assessment & Plan    1. Unresponsive/possible Cardiac arrest: Felt more likely to not have been actually arrest. Troponin with low flat trend. No chest pain. No arrhythmias on telemetry. Echo done but read is pending. Would not plan for cardiac work up at this time given his poor functional baseline.   2. Possible aspiration: remains NPO for speech eval.   3. Acute on chronic CKD: Cr 2.44>>2.83 today. Would hold ACEi until back to baseline  4. Dementia: Alert to surroundings. Further plans per primary   For questions or updates, please contact [place] Please consult [email or web address] for contact info under   Signed, Reino Bellis, NP  12/15/2018, 10:31 AM    Patient seen, examined. Available data reviewed. Agree with findings, assessment, and plan as outlined by Reino Bellis, NP.  The patient is independently interviewed  and examined.  He is an alert elderly man in no distress.  Lungs are clear, heart is regular rate and rhythm with no murmur, abdomen is soft and nontender, extremities have no edema.  The patient's troponin is minimally elevated with a flat trend of 0.07 mg/dL.  The patient's echo is reviewed and shows an LVEF of 40 to 45% with no significant valvular abnormalities.  The patient seems to be at his baseline.  He does not require any further cardiac evaluation at this time.  I  would hold his ACE inhibitor with AKI.  I am not sure that he needs any further medical or cardiac evaluation.  Yesterday's event is unlikely to represent a true cardiac arrest.  He had transient CPR after not responding and came back very quickly without any specific intervention.  We will sign off.  Please call if any questions.  Sherren Mocha, M.D. 12/15/2018 1:44 PM

## 2018-12-15 NOTE — Procedures (Addendum)
History: 73 year old male being evaluated for an unresponsive episode  Sedation: None  Technique: This is a 21 channel routine scalp EEG performed at the bedside with bipolar and monopolar montages arranged in accordance to the international 10/20 system of electrode placement. One channel was dedicated to EKG recording.    Background: The background consists of intermixed alpha and beta activities. There is a well defined posterior dominant rhythm of 8 Hz that attenuates with eye opening. Sleep is not recorded.  Occasional left frontotemporal (F7 >T7 >Fp1) sharp waves are seen without periodicity or rhythmicity.  Photic stimulation: Physiologic driving is not performed  EEG Abnormalities: 1) interictal left frontotemporal sharp waves  Clinical Interpretation: This EEG is most consistent with a region of potential epileptogenicity(seizure focus) in the left frontotemporal region.    There was no seizure recorded on this study.   Roland Rack, MD [facility] [phone]  If 7pm- 7am, please page neurology on call as listed in [place].

## 2018-12-15 NOTE — Progress Notes (Signed)
  Echocardiogram 2D Echocardiogram has been performed.  William [name] 12/15/2018, 10:13 AM

## 2018-12-15 NOTE — Progress Notes (Signed)
EEG complete - results pending 

## 2018-12-15 NOTE — Progress Notes (Addendum)
[facility]  PROGRESS NOTE  William [name] LYY:[id number] DOB: 12-11-1945 DOA: 12/14/2018 PCP: Patient, No Pcp Per   Brief HPI:   73 year old male with history of dementia, hypertension, seizure disorder, chronic kidney disease stage III, diabetes mellitus type 2 on insulin, alcohol abuse in the past, depression was brought to the ED from skilled nursing facility for altered mental status.  As per ED notes patient was hard to arouse at the nursing facility and CPR was performed less than a minute after which patient woke up and told them to stop.  Patient was sent to ED.  Cardiology was consulted.  Cardiology has signed off and does not feel that patient had cardiac arrest.    Subjective   This morning patient denies any pain or shortness of breath   Assessment/Plan:     1. Altered mental status/episode of unresponsiveness-resolved, cardiology does not feel that patient had cardiac arrest.  Patient's EEG is consistent with a region of of potential epileptogenecity in the left frontotemporal region.  Patient was on Depakote at skilled facility, will obtain Depakote level.  Start Keppra to 50 mg p.o. twice daily.  2. ?  Cardiac arrest-cardiology has signed off and does not feel that patient had cardiac arrest.  He only had mild elevation of troponin, flat trend.   3. History of seizures-patient is on valproic acid, will obtain valproic acid level.  Start Keppra to 50 mg p.o. twice daily.  4. Chronic kidney disease stage III-creatinine at baseline.  5. Dementia-with behavior disturbance, Perative care consulted.Start Ativan as needed for agitation.  6. Hypertension-blood pressure is stable, continue lisinopril.  7. Rash/dermatitis-patient has erythematous papular rash on left arm, left side of the face.  Complains of itching.  Continue Cetaphil lotion, start hydroxyzine 10 mg 3 times daily as needed         CBG: Recent Labs  Lab 12/14/18 1752 12/14/18 2122  12/15/18 0601 12/15/18 1225  GLUCAP 281* 249* 179* 210*    CBC: Recent Labs  Lab 12/14/18 1509 12/14/18 1523 12/15/18 0522  WBC 8.6  --  6.7  NEUTROABS 6.2  --   --   HGB 12.8* 12.9* 13.2  HCT 40.6 38.0* 40.4  MCV 89.6  --  89.4  PLT 212  --  127    Basic Metabolic Panel: Recent Labs  Lab 12/14/18 1509 12/14/18 1523 12/15/18 0522  NA 133* 131* 137  K 5.1 5.1 4.8  CL 100  --  102  CO2 20*  --  23  GLUCOSE 331*  --  205*  BUN 53*  --  56*  CREATININE 2.44*  --  2.83*  CALCIUM 9.4  --  9.2  MG  --   --  1.8     DVT prophylaxis: Heparin  Code Status: Full code  Family Communication: No family at bedside  Disposition Plan: likely skilled nursing facility in 1 to 2 days.     Consultants:  Cardiology  Procedures:  EEG   Antibiotics:   Anti-infectives (From admission, onward)   None       Objective   Vitals:   12/15/18 0036 12/15/18 0539 12/15/18 0757 12/15/18 1227  BP: 137/87 123/81 (!) 151/82 140/87  Pulse: 64 62 70 68  Resp: 20 18 19 20   Temp: 97.8 F (36.6 C) 98 F (36.7 C) 98.1 F (36.7 C) 97.7 F (36.5 C)  TempSrc: Oral  Oral Oral  SpO2: 97% 97% 97% 98%  Weight: 98.9 kg  Intake/Output Summary (Last 24 hours) at 12/15/2018 1605 Last data filed at 12/15/2018 1525 Gross per 24 hour  Intake 935.3 ml  Output 875 ml  Net 60.3 ml   Filed Weights   12/15/18 0036  Weight: 98.9 kg     Physical Examination:    General: Appears in no acute distress  Cardiovascular: S1-S2, regular, no murmur auscultated  Respiratory: Clear to auscultation bilaterally  Abdomen: Abdomen is soft, nontender, no organomegaly  Extremities: No edema of the lower extremities  Neurologic: Alert, oriented x3, no focal deficit noted  Skin-erythematous rash noted at the left arm, left side of the face and left eyelid     Data Reviewed: I have personally reviewed following labs and imaging studies   Recent Results (from the past 240  hour(s))  MRSA PCR Screening     Status: Abnormal   Collection Time: 12/14/18  6:52 PM  Result Value Ref Range Status   MRSA by PCR POSITIVE (A) NEGATIVE Final    Comment:        The GeneXpert MRSA Assay (FDA approved for NASAL specimens only), is one component of a comprehensive MRSA colonization surveillance program. It is not intended to diagnose MRSA infection nor to guide or monitor treatment for MRSA infections. RESULT CALLED TO, READ BACK BY AND VERIFIED WITH: B RUDD RN [id number] 12/14/18 A BROWNING Performed at [facility], [place] [address] [place], [place] [address]      Liver Function Tests: Recent Labs  Lab 12/15/18 0522  AST 11*  ALT 17  ALKPHOS 65  BILITOT 0.7  PROT 6.0*  ALBUMIN 3.4*   No results for input(s): LIPASE, AMYLASE in the last 168 hours. Recent Labs  Lab 12/15/18 0522  AMMONIA 30    Cardiac Enzymes: Recent Labs  Lab 12/14/18 1509 12/14/18 1921 12/14/18 2330 12/15/18 0522  TROPONINI 0.08* 0.09* 0.07* 0.07*   BNP (last 3 results) No results for input(s): BNP in the last 8760 hours.  ProBNP (last 3 results) No results for input(s): PROBNP in the last 8760 hours.    Studies: Dg Chest 2 View  Result Date: 12/14/2018 CLINICAL DATA:  Chest pain.  Patient status post CPR today. EXAM: CHEST - 2 VIEW COMPARISON:  Single-view of the chest 12/30/2015. PA and lateral chest 11/19/2015. FINDINGS: Lung volumes are somewhat low with basilar atelectasis. No consolidative process, pneumothorax or pleural effusion. Heart size is upper normal. Remote left rib fractures, midthoracic compression fracture and advanced left glenohumeral osteoarthritis noted. No acute bony abnormality. IMPRESSION: No acute disease. Electronically Signed   By: Inge Rise M.D.   On: 12/14/2018 15:36   Ct Head Wo Contrast  Result Date: 12/14/2018 CLINICAL DATA:  Altered mental status as patient found unresponsive in bed. EXAM: CT HEAD WITHOUT CONTRAST TECHNIQUE:  Contiguous axial images were obtained from the base of the skull through the vertex without intravenous contrast. COMPARISON:  12/30/2015 FINDINGS: Brain: Ventricles, cisterns and other CSF spaces are unchanged. There is mild age related atrophic change. There is chronic ischemic microvascular disease. There is no mass, mass effect, shift of midline structures or acute hemorrhage. No evidence of acute infarction. Vascular: No hyperdense vessel or unexpected calcification. Skull: Suggestion old nasal bone fracture. Sinuses/Orbits: Orbits are within normal. Complete opacification over the right maxillary sinus slightly worse. Other: None. IMPRESSION: No acute findings. Chronic ischemic microvascular disease and age related atrophic change. Moderate chronic sinus inflammatory change of the right maxillary sinus slightly worse. Findings suggesting old nasal bone fracture.  Electronically Signed   By: Marin Olp M.D.   On: 12/14/2018 16:25    Scheduled Meds: . busPIRone  5 mg Oral BID  . cetaphil   Topical BID  . Chlorhexidine Gluconate Cloth  6 each Topical Q0600  . heparin  5,000 Units Subcutaneous Q8H  . insulin aspart  0-15 Units Subcutaneous TID WC  . insulin aspart  0-5 Units Subcutaneous QHS  . insulin glargine  50 Units Subcutaneous Daily  . levETIRAcetam  250 mg Oral BID  . lisinopril  5 mg Oral Daily  . mupirocin ointment  1 application Nasal BID  . QUEtiapine  100 mg Oral BID  . sertraline  100 mg Oral QHS  . valproic acid  500 mg Oral TID    Admission status: Inpatient: Based on patients clinical presentation and evaluation of above clinical data, I have made determination that patient meets Inpatient criteria at this time.  Time spent: 25 min  [facility] Pager [phone]. If 7PM-7AM, please contact night-coverage at [email or web address], Office  [phone]  password [email or web address]  12/15/2018, 4:05 PM  LOS: 0 days

## 2018-12-16 LAB — GLUCOSE, CAPILLARY
Glucose-Capillary: 112 mg/dL — ABNORMAL HIGH (ref 70–99)
Glucose-Capillary: 119 mg/dL — ABNORMAL HIGH (ref 70–99)
Glucose-Capillary: 150 mg/dL — ABNORMAL HIGH (ref 70–99)
Glucose-Capillary: 147 mg/dL — ABNORMAL HIGH (ref 70–99)
Glucose-Capillary: 73 mg/dL (ref 70–99)

## 2018-12-16 NOTE — Clinical Social Work Note (Signed)
Tried calling patient's William [name] again. No answer. Didn't leave a second voicemail.  Dayton Scrape, [place]

## 2018-12-16 NOTE — Progress Notes (Addendum)
Hipolito Bayley indicated to me that Briscoe Deutscher is would be the person he would pick to help him make medical decisions.  Spoke with Briscoe Deutscher (S/O of 40 years) and surrogate decision maker for [place].  Per Izora Gala she wants him in a different SNF.  "They are mean to him".  Izora Gala states they were not giving him his seizure medication even though she asked them to give it.  Also she would visit him and find him soaked with urine.  She strongly requests that he be moved to a different facility.  I asked if she and [facility] had ever talked about life and death.  She replied "no".  I asked if he became very ill and needed life support would he want it?  Izora Gala asked me to hang on and she asked the question of her daughter (Bill's "step" daughter).  Her daughter replied "no".  Izora Gala agreed.   [facility] would not want life support if he became so ill that he needed it.    Patient awake, alert, confused.  (I believe this is baseline).  Eating/drinking.  Recommendations: Code status changed to DNR.   Please ensure that he leaves the hospital with a Gold DNR form. Please investigate the possibility of discharging to a different SNF. PMT will follow from a distance.  Please call our office if we are needed more urgently.  Florentina Jenny, PA-C Palliative Medicine Pager: [phone]  Time 15 min.

## 2018-12-16 NOTE — Progress Notes (Signed)
  Speech Language Pathology Treatment: Dysphagia  Patient Details Name: William [name] MRN: [id number] DOB: 11-29-1945 Today's Date: 12/16/2018 Time: 1240-1301 SLP Time Calculation (min) (ACUTE ONLY): 21 min  Assessment / Plan / Recommendation Clinical Impression  Pt seen for follow-up to assess diet tolerance. Pt able to self-feed items from his lunch tray of dysphagia 3, thin liquids with set-up. No overt signs of aspiration note. Mastication prolonged as pt edentulous, but functional. He occasionally removed tougher pieces of meat. Continue dysphagia 3, thin liquids, whole meds with puree. No further skilled ST needs identified at this time. SLP will s/o.     HPI HPI: Pt is a 73 yo male admitted from SNF for unresponsiveness. Brief CPR was done (<1 min). CT Head negative; additional w/u pending. CXR showed no acute disease. PMH includes: DM, seizures, HTN, hearing impairment, cataract, alcohol abuse, CKD, probable dementia      SLP Plan  Discharge SLP treatment due to (comment)(goals met, education complete)       Recommendations  Diet recommendations: Dysphagia 3 (mechanical soft);Thin liquid Liquids provided via: Cup Medication Administration: Whole meds with puree Supervision: Patient able to self feed Compensations: Slow rate;Small sips/bites Postural Changes and/or Swallow Maneuvers: Seated upright 90 degrees                Oral Care Recommendations: Oral care BID Follow up Recommendations: None SLP Visit Diagnosis: Dysphagia, unspecified (R13.10) Plan: Discharge SLP treatment due to (comment)(goals met, education complete)       William [name], William [name], William [name] Pager: [phone] Office: [phone]   Aliene Altes 12/16/2018, 1:01 PM

## 2018-12-16 NOTE — TOC Initial Note (Signed)
Transition of Care [facility]) - Initial/Assessment Note    Patient Details  Name: William [name] MRN: [id number] Date of Birth: 1946-01-04  Transition of Care [facility]) CM/SW Contact:    Candie Chroman, LCSW Phone Number: 12/16/2018, 3:25 PM  Clinical Narrative:  Received call back from [facility] CSW introduced role and explained that discharge planning would be discussed. She confirmed that patient is a long-term resident at [facility] and plan is for return at discharge. CSW could not get clear answer as to how they are related. [name] said they are not cousins and mentioned something about their mothers. She and patient lived together for 37 years. She asked why patient was in the hospital. Left message for RN to call her. CSW asked [name] to keep her phone nearby. Palliative has been trying to contact her. No further concerns. CSW encouraged [name] to contact CSW as needed. CSW will continue to follow patient and [name] for support and facilitate discharge back to SNF once medially stable.        Expected Discharge Plan: Skilled Nursing Facility Barriers to Discharge: Continued Medical Work up   Patient Goals and CMS Choice Patient states their goals for this hospitalization and ongoing recovery are:: Patient is not fully oriented. CMS Medicare.gov Compare Post Acute Care list provided to:: Other (Comment Required)(Patient will return to previous SNF.) Choice offered to / list presented to : NA  Expected Discharge Plan and Services Expected Discharge Plan: [place] Choice: [place] arrangements for the past 2 months: [place]                          Prior Living Arrangements/Services Living arrangements for the past 2 months: [place] Lives with:: Facility Resident Patient language and need for interpreter reviewed:: No Do you feel safe going back to the place where you  live?: Yes      Need for Family Participation in Patient Care: Yes (Comment)     Criminal Activity/Legal Involvement Pertinent to Current Situation/Hospitalization: No - Comment as needed  Activities of Daily Living Home Assistive Devices/Equipment: Environmental consultant (specify type), Wheelchair ADL Screening (condition at time of admission) Patient's cognitive ability adequate to safely complete daily activities?: No Is the patient deaf or have difficulty hearing?: Yes Does the patient have difficulty seeing, even when wearing glasses/contacts?: No Does the patient have difficulty concentrating, remembering, or making decisions?: No Patient able to express need for assistance with ADLs?: No Does the patient have difficulty dressing or bathing?: No Independently performs ADLs?: No Communication: Independent Is this a change from baseline?: Pre-admission baseline Dressing (OT): Needs assistance Is this a change from baseline?: Pre-admission baseline Grooming: Needs assistance Is this a change from baseline?: Pre-admission baseline Feeding: Independent Bathing: Needs assistance Is this a change from baseline?: Pre-admission baseline Toileting: Needs assistance Is this a change from baseline?: Pre-admission baseline In/Out Bed: Needs assistance Is this a change from baseline?: Pre-admission baseline Walks in Home: Needs assistance Is this a change from baseline?: Pre-admission baseline Does the patient have difficulty walking or climbing stairs?: Yes Weakness of Legs: Both Weakness of Arms/Hands: None  Permission Sought/Granted Permission sought to share information with : Family Supports Permission granted to share information with : No(Not fully oriented.)  Share Information with NAME: Briscoe Deutscher  Permission granted to share info w AGENCY: [facility]  Permission granted to share  info w Relationship: Unsure  Permission granted to share info w Contact Information:  [phone]  Emotional Assessment Appearance:: Appears stated age Attitude/Demeanor/Rapport: Unable to Assess Affect (typically observed): Unable to Assess Orientation: : Oriented to Self, Oriented to Place Alcohol / Substance Use: Never Used Psych Involvement: No (comment)  Admission diagnosis:  Transient alteration of awareness [R40.4] Patient Active Problem List   Diagnosis Date Noted  . Palliative care encounter   . Altered mental status, unspecified 12/14/2018  . Elevated troponin   . Smokeless tobacco use 12/31/2017  . Dyspepsia 10/20/2017  . Dietary noncompliance 10/20/2017  . COPD mixed type ([place]) 02/17/2017  . Diabetes mellitus due to underlying condition, uncontrolled, with renal complication ([place]) 31/43/8887  . Hyperlipidemia 03/17/2016  . Abnormality of gait 01/14/2016  . Psychosis ([place]) 01/03/2016  . Seizure ([place]) 12/30/2015  . Iron deficiency anemia   . Chronic renal insufficiency, stage III (moderate) ([place]) 02/26/2014  . Urinary incontinence 02/10/2013  . History of alcohol abuse 12/03/2009  . Anxiety state 09/30/2006  . Depression 09/30/2006  . Hearing impairment 09/30/2006  . Essential hypertension 09/30/2006   PCP:  Patient, No Pcp Per Pharmacy:   [place], [place] [address] Phone: [phone] Fax: [phone]     Social Determinants of Health (SDOH) Interventions    Readmission Risk Interventions No flowsheet data found.

## 2018-12-16 NOTE — NC FL2 (Signed)
[place] MEDICAID FL2 LEVEL OF CARE SCREENING TOOL     IDENTIFICATION  Patient Name: William [name] Birthdate: Sep 23, 1946 Sex: male Admission Date (Current Location): 12/14/2018  [facility] and [place] Number:  Herbalist and Address:  [facility] [place], [place] [address], [place],  [address]      Provider Number: [id number]  Attending Physician Name and Address:  Oswald Hillock, MD  Relative Name and Phone Number:       Current Level of Care: Hospital Recommended Level of Care: Skilled Nursing Facility(with palliative) Prior Approval Number:    Date Approved/Denied:   PASRR Number: [id number]  Discharge Plan: SNF(with palliative)    Current Diagnoses: Patient Active Problem List   Diagnosis Date Noted  . Palliative care encounter   . Altered mental status, unspecified 12/14/2018  . Elevated troponin   . Smokeless tobacco use 12/31/2017  . Dyspepsia 10/20/2017  . Dietary noncompliance 10/20/2017  . COPD mixed type ([place]) 02/17/2017  . Diabetes mellitus due to underlying condition, uncontrolled, with renal complication ([place]) 01/60/1093  . Hyperlipidemia 03/17/2016  . Abnormality of gait 01/14/2016  . Psychosis ([place]) 01/03/2016  . Seizure ([place]) 12/30/2015  . Iron deficiency anemia   . Chronic renal insufficiency, stage III (moderate) ([place]) 02/26/2014  . Urinary incontinence 02/10/2013  . History of alcohol abuse 12/03/2009  . Anxiety state 09/30/2006  . Depression 09/30/2006  . Hearing impairment 09/30/2006  . Essential hypertension 09/30/2006    Orientation RESPIRATION BLADDER Height & Weight     Self, Place  Normal Incontinent, External catheter Weight: 214 lb (97.1 kg) Height:     BEHAVIORAL SYMPTOMS/MOOD NEUROLOGICAL BOWEL NUTRITION STATUS  Other (Comment)(Restless.) Convulsions/Seizures Continent Diet(DYS 3)  AMBULATORY STATUS COMMUNICATION OF NEEDS Skin   Limited Assist Verbally Other (Comment)(Rash.)                      Personal Care Assistance Level of Assistance              Functional Limitations Info  Sight, Hearing, Speech Sight Info: Adequate Hearing Info: Adequate Speech Info: Adequate    SPECIAL CARE FACTORS FREQUENCY  PT (By licensed PT), Blood pressure     PT Frequency: 5 x week              Contractures Contractures Info: Not present    Additional Factors Info  Isolation Precautions, Code Status, Allergies, Psychotropic Code Status Info: Full code Allergies Info: NKDA Psychotropic Info: Depression: Buspar 5 mg PO BID, Seroquel 100 mg PO BID, Zoloft 100 mg PO QHS, Depakene 500 mg PO TID, Vistaril 10 mg PO TID prn.   Isolation Precautions Info: Contact: VRE, MRSA     Current Medications (12/16/2018):  This is the current hospital active medication list Current Facility-Administered Medications  Medication Dose Route Frequency Provider Last Rate Last Dose  . 0.9 %  sodium chloride infusion   Intravenous Continuous Starla Link, Kshitiz, MD 75 mL/hr at 12/15/18 0600    . acetaminophen (TYLENOL) tablet 650 mg  650 mg Oral Q6H PRN Aline August, MD       Or  . acetaminophen (TYLENOL) suppository 650 mg  650 mg Rectal Q6H PRN Starla Link, Kshitiz, MD      . busPIRone (BUSPAR) tablet 5 mg  5 mg Oral BID Aline August, MD   5 mg at 12/15/18 2157  . cetaphil lotion   Topical BID Dellinger, Bobby Rumpf, PA-C      . Chlorhexidine Gluconate  Cloth 2 % PADS 6 each  6 each Topical Q0600 Aline August, MD   6 each at 12/15/18 0606  . heparin injection 5,000 Units  5,000 Units Subcutaneous Q8H Alekh, Kshitiz, MD   5,000 Units at 12/16/18 0620  . hydrOXYzine (ATARAX/VISTARIL) tablet 10 mg  10 mg Oral TID PRN Oswald Hillock, MD      . insulin aspart (novoLOG) injection 0-15 Units  0-15 Units Subcutaneous TID WC Aline August, MD   3 Units at 12/15/18 1740  . insulin aspart (novoLOG) injection 0-5 Units  0-5 Units Subcutaneous QHS Aline August, MD   2 Units at 12/14/18 2145  . insulin  glargine (LANTUS) injection 50 Units  50 Units Subcutaneous Daily Aline August, MD   50 Units at 12/15/18 1058  . ipratropium-albuterol (DUONEB) 0.5-2.5 (3) MG/3ML nebulizer solution 3 mL  3 mL Nebulization Q6H PRN Alekh, Kshitiz, MD      . levETIRAcetam (KEPPRA) tablet 250 mg  250 mg Oral BID Oswald Hillock, MD   250 mg at 12/15/18 2158  . lisinopril (PRINIVIL,ZESTRIL) tablet 5 mg  5 mg Oral Daily Starla Link, Kshitiz, MD   5 mg at 12/15/18 1059  . mupirocin ointment (BACTROBAN) 2 % 1 application  1 application Nasal BID Aline August, MD   1 application at 79/48/01 2157  . ondansetron (ZOFRAN) tablet 4 mg  4 mg Oral Q6H PRN Aline August, MD       Or  . ondansetron (ZOFRAN) injection 4 mg  4 mg Intravenous Q6H PRN Alekh, Kshitiz, MD      . QUEtiapine (SEROQUEL) tablet 100 mg  100 mg Oral BID Aline August, MD   100 mg at 12/15/18 2158  . senna-docusate (Senokot-S) tablet 1 tablet  1 tablet Oral QHS PRN Aline August, MD      . sertraline (ZOLOFT) tablet 100 mg  100 mg Oral QHS Starla Link, Kshitiz, MD   100 mg at 12/15/18 2158  . valproic acid (DEPAKENE) solution 500 mg  500 mg Oral TID Aline August, MD   500 mg at 12/15/18 2159     Discharge Medications: Please see discharge summary for a list of discharge medications.  Relevant Imaging Results:  Relevant Lab Results:   Additional Information SS#: [id number]  Candie Chroman, LCSW

## 2018-12-16 NOTE — Progress Notes (Signed)
[facility]  PROGRESS NOTE  William [name] PZW:[id number] DOB: [birth year] DOA: 12/14/2018 PCP: Patient, No Pcp Per   Brief HPI:   73 year old male with history of dementia, hypertension, seizure disorder, chronic kidney disease stage III, diabetes mellitus type 2 on insulin, alcohol abuse in the past, depression was brought to the ED from skilled nursing facility for altered mental status.  As per ED notes patient was hard to arouse at the nursing facility and CPR was performed less than a minute after which patient woke up and told them to stop.  Patient was sent to ED.  Cardiology was consulted.  Cardiology has signed off and does not feel that patient had cardiac arrest.    Subjective   Patient seen and examined, denies any complaints.  Mental status is still the same  ?  At baseline   Assessment/Plan:    1. Altered mental status/episode of unresponsiveness-resolved, cardiology does not feel that patient had cardiac arrest.  Patient's EEG is consistent with a region of of potential epileptogenecity in the left frontotemporal region.  Patient was on Depakote at skilled facility, will obtain Depakote level.  Started on  Keppra 250 mg p.o. twice daily.  2. ?  Cardiac arrest-cardiology has signed off and does not feel that patient had cardiac arrest.  He only had mild elevation of troponin, flat trend.   3. History of seizures-patient is on valproic acid, valproic acid level 29.?  Noncompliance with taking valproate, continue valproic acid 500 mg p.o. 3 times daily, started on Keppra 250 mg p.o. twice daily.  4. Chronic kidney disease stage III-creatinine at baseline.  5. Dementia-with behavior disturbance, Perative care consulted.Start Ativan as needed for agitation.  6. Hypertension-blood pressure is stable, continue lisinopril.  7. Rash/dermatitis-improved, patient had erythematous papular rash on left arm, left side of the face.  Complains of itching.  Continue Cetaphil  lotion, started  hydroxyzine 10 mg 3 times daily as needed  8. Diabetes mellitus type 2-blood was is controlled, continue sliding scale insulin with NovoLog.     CBG: Recent Labs  Lab 12/15/18 1225 12/15/18 1734 12/15/18 2217 12/16/18 0619 12/16/18 1149  GLUCAP 210* 165* 114* 112* 119*    CBC: Recent Labs  Lab 12/14/18 1509 12/14/18 1523 12/15/18 0522  WBC 8.6  --  6.7  NEUTROABS 6.2  --   --   HGB 12.8* 12.9* 13.2  HCT 40.6 38.0* 40.4  MCV 89.6  --  89.4  PLT 212  --  423    Basic Metabolic Panel: Recent Labs  Lab 12/14/18 1509 12/14/18 1523 12/15/18 0522  NA 133* 131* 137  K 5.1 5.1 4.8  CL 100  --  102  CO2 20*  --  23  GLUCOSE 331*  --  205*  BUN 53*  --  56*  CREATININE 2.44*  --  2.83*  CALCIUM 9.4  --  9.2  MG  --   --  1.8     DVT prophylaxis: Heparin  Code Status: Full code  Family Communication: No family at bedside  Disposition Plan: likely skilled nursing facility in 1 to 2 days.     Consultants:  Cardiology  Procedures:  EEG   Antibiotics:   Anti-infectives (From admission, onward)   None       Objective   Vitals:   12/15/18 1227 12/15/18 2000 12/16/18 0438 12/16/18 1200  BP: 140/87 (!) 134/96 127/77 (!) 145/81  Pulse: 68 72 73 72  Resp: 20 20 18  16  Temp: 97.7 F (36.5 C) 98 F (36.7 C) (!) 97.4 F (36.3 C) (!) 97.5 F (36.4 C)  TempSrc: Oral Oral Oral Oral  SpO2: 98% 96% 96% 96%  Weight:   97.1 kg     Intake/Output Summary (Last 24 hours) at 12/16/2018 1247 Last data filed at 12/16/2018 1154 Gross per 24 hour  Intake 480 ml  Output 1925 ml  Net -1445 ml   Filed Weights   12/15/18 0036 12/16/18 0438  Weight: 98.9 kg 97.1 kg     Physical Examination:   General: Appears in no acute distress  Cardiovascular: S1-S2, regular, no murmur auscultated  Respiratory: Clear to auscultation bilaterally  Abdomen: Abdomen is soft, nontender, no organomegaly  Extremities: No edema of the lower  extremities  Neurologic: Alert, oriented x2 , no focal deficit      Data Reviewed: I have personally reviewed following labs and imaging studies   Recent Results (from the past 240 hour(s))  MRSA PCR Screening     Status: Abnormal   Collection Time: 12/14/18  6:52 PM  Result Value Ref Range Status   MRSA by PCR POSITIVE (A) NEGATIVE Final    Comment:        The GeneXpert MRSA Assay (FDA approved for NASAL specimens only), is one component of a comprehensive MRSA colonization surveillance program. It is not intended to diagnose MRSA infection nor to guide or monitor treatment for MRSA infections. RESULT CALLED TO, READ BACK BY AND VERIFIED WITH: B RUDD RN 2047 12/14/18 A BROWNING Performed at [facility], [place] [address] [place], [place] [address]      Liver Function Tests: Recent Labs  Lab 12/15/18 0522  AST 11*  ALT 17  ALKPHOS 65  BILITOT 0.7  PROT 6.0*  ALBUMIN 3.4*   No results for input(s): LIPASE, AMYLASE in the last 168 hours. Recent Labs  Lab 12/15/18 0522  AMMONIA 30    Cardiac Enzymes: Recent Labs  Lab 12/14/18 1509 12/14/18 1921 12/14/18 2330 12/15/18 0522  TROPONINI 0.08* 0.09* 0.07* 0.07*   BNP (last 3 results) No results for input(s): BNP in the last 8760 hours.  ProBNP (last 3 results) No results for input(s): PROBNP in the last 8760 hours.    Studies: Dg Chest 2 View  Result Date: 12/14/2018 CLINICAL DATA:  Chest pain.  Patient status post CPR today. EXAM: CHEST - 2 VIEW COMPARISON:  Single-view of the chest 12/30/2015. PA and lateral chest 11/19/2015. FINDINGS: Lung volumes are somewhat low with basilar atelectasis. No consolidative process, pneumothorax or pleural effusion. Heart size is upper normal. Remote left rib fractures, midthoracic compression fracture and advanced left glenohumeral osteoarthritis noted. No acute bony abnormality. IMPRESSION: No acute disease. Electronically Signed   By: Inge Rise M.D.   On:  12/14/2018 15:36   Ct Head Wo Contrast  Result Date: 12/14/2018 CLINICAL DATA:  Altered mental status as patient found unresponsive in bed. EXAM: CT HEAD WITHOUT CONTRAST TECHNIQUE: Contiguous axial images were obtained from the base of the skull through the vertex without intravenous contrast. COMPARISON:  12/30/2015 FINDINGS: Brain: Ventricles, cisterns and other CSF spaces are unchanged. There is mild age related atrophic change. There is chronic ischemic microvascular disease. There is no mass, mass effect, shift of midline structures or acute hemorrhage. No evidence of acute infarction. Vascular: No hyperdense vessel or unexpected calcification. Skull: Suggestion old nasal bone fracture. Sinuses/Orbits: Orbits are within normal. Complete opacification over the right maxillary sinus slightly worse. Other: None. IMPRESSION: No  acute findings. Chronic ischemic microvascular disease and age related atrophic change. Moderate chronic sinus inflammatory change of the right maxillary sinus slightly worse. Findings suggesting old nasal bone fracture. Electronically Signed   By: Marin Olp M.D.   On: 12/14/2018 16:25    Scheduled Meds: . busPIRone  5 mg Oral BID  . cetaphil   Topical BID  . Chlorhexidine Gluconate Cloth  6 each Topical Q0600  . heparin  5,000 Units Subcutaneous Q8H  . insulin aspart  0-15 Units Subcutaneous TID WC  . insulin aspart  0-5 Units Subcutaneous QHS  . insulin glargine  50 Units Subcutaneous Daily  . levETIRAcetam  250 mg Oral BID  . lisinopril  5 mg Oral Daily  . mupirocin ointment  1 application Nasal BID  . QUEtiapine  100 mg Oral BID  . sertraline  100 mg Oral QHS  . valproic acid  500 mg Oral TID    Admission status: Inpatient: Based on patients clinical presentation and evaluation of above clinical data, I have made determination that patient meets Inpatient criteria at this time.  Time spent: 25 min  [place] [facility] Pager [phone].  If 7PM-7AM, please contact night-coverage at [email or web address], Office  [phone]  password [email or web address]  12/16/2018, 12:47 PM  LOS: 1 day

## 2018-12-16 NOTE — Progress Notes (Signed)
   12/16/18 0550  Provider Notification  Provider Name/Title K Schorr  Date Provider Notified 12/16/18  Time Provider Notified [phone]  Notification Type Page  Notification Reason Other (Comment) (U/A result)  Response Other (Comment) (awaiting callback/orders)

## 2018-12-17 LAB — GLUCOSE, CAPILLARY
Glucose-Capillary: 87 mg/dL (ref 70–99)
Glucose-Capillary: 63 mg/dL — ABNORMAL LOW (ref 70–99)
Glucose-Capillary: 92 mg/dL (ref 70–99)
Glucose-Capillary: 97 mg/dL (ref 70–99)
Glucose-Capillary: 94 mg/dL (ref 70–99)

## 2018-12-17 MED ORDER — INSULIN ASPART 100 UNIT/ML ~~LOC~~ SOLN
0.0000 [IU] | Freq: Three times a day (TID) | SUBCUTANEOUS | Status: DC
  Administered 2018-12-18: 2 [IU] via SUBCUTANEOUS
  Administered 2018-12-20 – 2018-12-21 (×3): 5 [IU] via SUBCUTANEOUS
  Administered 2018-12-21: 8 [IU] via SUBCUTANEOUS
  Administered 2018-12-22: 2 [IU] via SUBCUTANEOUS
  Administered 2018-12-22: 3 [IU] via SUBCUTANEOUS
  Administered 2018-12-22: 5 [IU] via SUBCUTANEOUS
  Administered 2018-12-23: 3 [IU] via SUBCUTANEOUS
  Administered 2018-12-23: 5 [IU] via SUBCUTANEOUS
  Administered 2018-12-23: 2 [IU] via SUBCUTANEOUS
  Administered 2018-12-24 (×3): 3 [IU] via SUBCUTANEOUS

## 2018-12-17 NOTE — TOC Progression Note (Addendum)
Transition of Care [facility]) - Progression Note    Patient Details  Name: William [name] MRN: [id number] Date of Birth: July 06, 1946  Transition of Care [facility]) CM/SW [place], LCSW Phone Number: 12/17/2018, 8:26 AM  Clinical Narrative:  Per palliative, [name] wants to look into changing SNF's due to concern with care. CSW sent out referral to local facilities.  1:49 pm: Patient has two bed offers so far: [facility] and [facility]. Left [name] a Advertising account executive. Will notify her of bed offers and get choice when she calls back.  3:01 pm: [facility] can offer a short-term rehab bed but DON would have to evaluate him after rehab to determine if they will take him for long-term care.  Expected Discharge Plan: [place] Barriers to Discharge: Continued Medical Work up  Expected Discharge Plan and Services Expected Discharge Plan: [place] Choice: [place] arrangements for the past 2 months: [place]                           Social Determinants of Health (SDOH) Interventions    Readmission Risk Interventions No flowsheet data found.

## 2018-12-17 NOTE — Progress Notes (Signed)
Physical Therapy Treatment Patient Details Name: William [name] MRN: [id number] DOB: 10-28-1945 Today's Date: 12/17/2018    History of Present Illness Pt is a 73 y.o. long-term resident at [facility] admitted on 12/14/18 with unresponsiveness requiring brief CPR (<1 min). Head CT negative for acute findings; additional workup pedning. CXR without acute disease. PMH includes DM, seizures, HTN, hearing impairment, cataracts, CKD, alcohol abuse, probable dementia.   PT Comments    Pt slowly progressing with mobility. Currently requiring increased assist for mobility; participation limited by apparent cognitive impairments. Will continue to follow acutely.  Follow Up Recommendations  SNF;Supervision/Assistance - 24 hour     Equipment Recommendations  None recommended by PT    Recommendations for Other Services       Precautions / Restrictions Precautions Precautions: Fall Restrictions Weight Bearing Restrictions: No    Mobility  Bed Mobility Overal bed mobility: Needs Assistance Bed Mobility: Supine to Sit;Sit to Supine     Supine to sit: Mod assist Sit to supine: Supervision   General bed mobility comments: Very slow, to initiate movement to EOB, lifting legs then stopping, eventually requiring modA to assist BLEs to EOB and UE support for trunk elevation; supervision to return to supine. Pt able to scoot up in bed with minA  Transfers                    Ambulation/Gait                 Stairs             Wheelchair Mobility    Modified Rankin (Stroke Patients Only)       Balance Overall balance assessment: Needs assistance   Sitting balance-Leahy Scale: Good                                      Cognition Arousal/Alertness: Awake/alert Behavior During Therapy: WFL for tasks assessed/performed Overall Cognitive Status: History of cognitive impairments - at baseline                                  General Comments: Per chart, h/o dementia; also very HOH. Pt answering majority of questions appropriately and following simple commands, but easily becomes agitated and verbally aggressive. Able to be somewhat redirected, although this is inconsistent      Exercises      General Comments        Pertinent Vitals/Pain Pain Assessment: No/denies pain    Home Living                      Prior Function            PT Goals (current goals can now be found in the care plan section) Acute Rehab PT Goals Patient Stated Goal: "I don't want to do anything else" PT Goal Formulation: With patient Time For Goal Achievement: 12/29/18 Potential to Achieve Goals: Fair Progress towards PT goals: Progressing toward goals    Frequency    Min 2X/week      PT Plan Current plan remains appropriate    Co-evaluation              AM-PAC PT "6 Clicks" Mobility   Outcome Measure  Help needed turning from your back to your side while in a flat bed without using  bedrails?: A Little Help needed moving from lying on your back to sitting on the side of a flat bed without using bedrails?: A Lot Help needed moving to and from a bed to a chair (including a wheelchair)?: A Little Help needed standing up from a chair using your arms (e.g., wheelchair or bedside chair)?: A Little Help needed to walk in hospital room?: A Little Help needed climbing 3-5 steps with a railing? : A Lot 6 Click Score: 16    End of Session   Activity Tolerance: Patient limited by fatigue Patient left: in bed;with call bell/phone within reach;with bed alarm set Nurse Communication: Mobility status PT Visit Diagnosis: Other abnormalities of gait and mobility (R26.89)     Time: 1113-1130 PT Time Calculation (min) (ACUTE ONLY): 17 min  Charges:  $Therapeutic Activity: 8-22 mins                    Mabeline Caras, PT, DPT Acute Rehabilitation Services  Pager [phone] Office [place] 12/17/2018, 12:46 PM

## 2018-12-17 NOTE — Progress Notes (Signed)
[facility]  PROGRESS NOTE  William [name]:[id number] DOB: [birth year] DOA: 12/14/2018 PCP: Patient, No Pcp Per   Brief HPI:   73 year old male with history of dementia, hypertension, seizure disorder, chronic kidney disease stage III, diabetes mellitus type 2 on insulin, alcohol abuse in the past, depression was brought to the ED from skilled nursing facility for altered mental status.  As per ED notes patient was hard to arouse at the nursing facility and CPR was performed less than a minute after which patient woke up and told them to stop.  Patient was sent to ED.  Cardiology was consulted.  Cardiology has signed off and does not feel that patient had cardiac arrest.    Subjective   Patient seen and examined, denies chest pain or shortness of breath.   Assessment/Plan:    1. Altered mental status/episode of unresponsiveness-resolved, cardiology does not feel that patient had cardiac arrest.  Patient's EEG is consistent with a region of of potential epileptogenecity in the left frontotemporal region.  Patient was on Depakote at skilled facility, will obtain Depakote level.  Started on  Keppra 250 mg p.o. twice daily.  2. ?  Cardiac arrest-cardiology has signed off and does not feel that patient had cardiac arrest.  He only had mild elevation of troponin, flat trend.   3. History of seizures-patient is on valproic acid, valproic acid level 29.?  Noncompliance with taking valproate, continue valproic acid 500 mg p.o. 3 times daily, started on Keppra 250 mg p.o. twice daily.  4. Chronic kidney disease stage III-creatinine at baseline.  5. Dementia-with behavior disturbance, Perative care consulted.Start Ativan as needed for agitation.  6. Hypertension-blood pressure is stable, continue lisinopril.  7. Rash/dermatitis-improved, patient had erythematous papular rash on left arm, left side of the face.  Complains of itching.  Continue Cetaphil lotion, started  hydroxyzine 10 mg  3 times daily as needed  8. Diabetes mellitus type 2-blood was is controlled, continue sliding scale insulin with NovoLog.     CBG: Recent Labs  Lab 12/16/18 1743 12/16/18 2114 12/17/18 0528 12/17/18 1207 12/17/18 1258  GLUCAP 147* 73 87 63* 92    CBC: Recent Labs  Lab 12/14/18 1509 12/14/18 1523 12/15/18 0522  WBC 8.6  --  6.7  NEUTROABS 6.2  --   --   HGB 12.8* 12.9* 13.2  HCT 40.6 38.0* 40.4  MCV 89.6  --  89.4  PLT 212  --  878    Basic Metabolic Panel: Recent Labs  Lab 12/14/18 1509 12/14/18 1523 12/15/18 0522  NA 133* 131* 137  K 5.1 5.1 4.8  CL 100  --  102  CO2 20*  --  23  GLUCOSE 331*  --  205*  BUN 53*  --  56*  CREATININE 2.44*  --  2.83*  CALCIUM 9.4  --  9.2  MG  --   --  1.8     DVT prophylaxis: Heparin  Code Status: Full code  Family Communication: Palliative care discussed with family, surrogate decision-maker for patient and she wants a different skilled facility.  Clinical social worker is working on finding a different skilled facility.  Disposition Plan: likely skilled nursing facility in 1 to 2 days.     Consultants:  Cardiology  Procedures:  EEG   Antibiotics:   Anti-infectives (From admission, onward)   None       Objective   Vitals:   12/17/18 0100 12/17/18 0536 12/17/18 0935 12/17/18 1302  BP:  116/68 Marland Kitchen)  143/88 132/80  Pulse:  64 68 71  Resp:  14  20  Temp:  97.6 F (36.4 C)  (!) 97.3 F (36.3 C)  TempSrc:  Oral  Oral  SpO2:  96%  96%  Weight: 95.3 kg       Intake/Output Summary (Last 24 hours) at 12/17/2018 1448 Last data filed at 12/17/2018 1218 Gross per 24 hour  Intake 840 ml  Output 300 ml  Net 540 ml   Filed Weights   12/15/18 0036 12/16/18 0438 12/17/18 0100  Weight: 98.9 kg 97.1 kg 95.3 kg     Physical Examination:   General: Appears in no acute distress  Cardiovascular: S1-S2, regular  Respiratory: Clear to auscultation bilaterally  Abdomen: Abdomen is soft, nontender,  no organomegaly  Extremities: No edema in the lower extremities  Neurologic: Alert, oriented x3, moving all extremities       Data Reviewed: I have personally reviewed following labs and imaging studies   Recent Results (from the past 240 hour(s))  MRSA PCR Screening     Status: Abnormal   Collection Time: 12/14/18  6:52 PM  Result Value Ref Range Status   MRSA by PCR POSITIVE (A) NEGATIVE Final    Comment:        The GeneXpert MRSA Assay (FDA approved for NASAL specimens only), is one component of a comprehensive MRSA colonization surveillance program. It is not intended to diagnose MRSA infection nor to guide or monitor treatment for MRSA infections. RESULT CALLED TO, READ BACK BY AND VERIFIED WITH: B RUDD RN 2047 12/14/18 A BROWNING Performed at [facility], [place] [address] [place],  [address]      Liver Function Tests: Recent Labs  Lab 12/15/18 0522  AST 11*  ALT 17  ALKPHOS 65  BILITOT 0.7  PROT 6.0*  ALBUMIN 3.4*   No results for input(s): LIPASE, AMYLASE in the last 168 hours. Recent Labs  Lab 12/15/18 0522  AMMONIA 30    Cardiac Enzymes: Recent Labs  Lab 12/14/18 1509 12/14/18 1921 12/14/18 2330 12/15/18 0522  TROPONINI 0.08* 0.09* 0.07* 0.07*   BNP (last 3 results) No results for input(s): BNP in the last 8760 hours.  ProBNP (last 3 results) No results for input(s): PROBNP in the last 8760 hours.    Studies: No results found.  Scheduled Meds: . busPIRone  5 mg Oral BID  . cetaphil   Topical BID  . Chlorhexidine Gluconate Cloth  6 each Topical Q0600  . heparin  5,000 Units Subcutaneous Q8H  . insulin aspart  0-15 Units Subcutaneous TID WC  . insulin aspart  0-5 Units Subcutaneous QHS  . insulin glargine  50 Units Subcutaneous Daily  . levETIRAcetam  250 mg Oral BID  . lisinopril  5 mg Oral Daily  . mupirocin ointment  1 application Nasal BID  . QUEtiapine  100 mg Oral BID  . sertraline  100 mg Oral QHS  .  valproic acid  500 mg Oral TID    Admission status: Inpatient: Based on patients clinical presentation and evaluation of above clinical data, I have made determination that patient meets Inpatient criteria at this time.  Time spent: 25 min  [facility] Pager [phone]. If 7PM-7AM, please contact night-coverage at [email or web address], Office  [phone]  password [email or web address]  12/17/2018, 2:48 PM  LOS: 2 days

## 2018-12-18 LAB — GLUCOSE, CAPILLARY
Glucose-Capillary: 66 mg/dL — ABNORMAL LOW (ref 70–99)
Glucose-Capillary: 101 mg/dL — ABNORMAL HIGH (ref 70–99)
Glucose-Capillary: 121 mg/dL — ABNORMAL HIGH (ref 70–99)
Glucose-Capillary: 97 mg/dL (ref 70–99)
Glucose-Capillary: 94 mg/dL (ref 70–99)

## 2018-12-18 NOTE — Progress Notes (Signed)
[facility]  PROGRESS NOTE  William [name] OJJ:[id number] DOB: 02/02/1946 DOA: 12/14/2018 PCP: Patient, No Pcp Per   Brief HPI:   73 year old male with history of dementia, hypertension, seizure disorder, chronic kidney disease stage III, diabetes mellitus type 2 on insulin, alcohol abuse in the past, depression was brought to the ED from skilled nursing facility for altered mental status.  As per ED notes patient was hard to arouse at the nursing facility and CPR was performed less than a minute after which patient woke up and told them to stop.  Patient was sent to ED.  Cardiology was consulted.  Cardiology has signed off and does not feel that patient had cardiac arrest.    Subjective   Patient seen and examined, denies chest pain or shortness of breath.   Assessment/Plan:    1. Altered mental status/episode of unresponsiveness-resolved, cardiology does not feel that patient had cardiac arrest.  Patient's EEG is consistent with a region of of potential epileptogenecity in the left frontotemporal region.  Patient was on Depakote at skilled facility, will obtain Depakote level.  Started on  Keppra 250 mg p.o. twice daily.  2. ?  Cardiac arrest-cardiology has signed off and does not feel that patient had cardiac arrest.  He only had mild elevation of troponin, flat trend.   3. History of seizures-patient is on valproic acid, valproic acid level 29.?  Noncompliance with taking valproate, continue valproic acid 500 mg p.o. 3 times daily, started on Keppra 250 mg p.o. twice daily.  4. Chronic kidney disease stage III-creatinine at baseline.  5. Dementia-with behavior disturbance, Perative care consulted.Start Ativan as needed for agitation.  6. Hypertension-blood pressure is stable, continue lisinopril.  7. Rash/dermatitis-improved, patient had erythematous papular rash on left arm, left side of the face.  Complains of itching.  Continue Cetaphil lotion, started  hydroxyzine 10 mg  3 times daily as needed  8. Diabetes mellitus type 2-blood was is controlled, continue sliding scale insulin with NovoLog.     CBG: Recent Labs  Lab 12/17/18 1702 12/17/18 2108 12/18/18 0601 12/18/18 0700 12/18/18 1132  GLUCAP 97 94 66* 101* 121*    CBC: Recent Labs  Lab 12/14/18 1509 12/14/18 1523 12/15/18 0522  WBC 8.6  --  6.7  NEUTROABS 6.2  --   --   HGB 12.8* 12.9* 13.2  HCT 40.6 38.0* 40.4  MCV 89.6  --  89.4  PLT 212  --  937    Basic Metabolic Panel: Recent Labs  Lab 12/14/18 1509 12/14/18 1523 12/15/18 0522  NA 133* 131* 137  K 5.1 5.1 4.8  CL 100  --  102  CO2 20*  --  23  GLUCOSE 331*  --  205*  BUN 53*  --  56*  CREATININE 2.44*  --  2.83*  CALCIUM 9.4  --  9.2  MG  --   --  1.8     DVT prophylaxis: Heparin  Code Status: Full code  Family Communication: Palliative care discussed with family, surrogate decision-maker for patient and she wants a different skilled facility.  Clinical social worker is working on finding a different skilled facility.  Disposition Plan: likely skilled nursing facility in 1 to 2 days.     Consultants:  Cardiology  Procedures:  EEG   Antibiotics:   Anti-infectives (From admission, onward)   None       Objective   Vitals:   12/17/18 1302 12/17/18 2122 12/18/18 0558 12/18/18 1135  BP: 132/80 Marland Kitchen)  148/85 (!) 105/57 (!) 106/58  Pulse: 71 71 (!) 58 67  Resp: 20 20 20 18   Temp: (!) 97.3 F (36.3 C) 97.6 F (36.4 C) 97.8 F (36.6 C)   TempSrc: Oral Oral    SpO2: 96% 100% 95% 98%  Weight:   94.8 kg     Intake/Output Summary (Last 24 hours) at 12/18/2018 1207 Last data filed at 12/17/2018 2130 Gross per 24 hour  Intake 720 ml  Output 450 ml  Net 270 ml   Filed Weights   12/16/18 0438 12/17/18 0100 12/18/18 0558  Weight: 97.1 kg 95.3 kg 94.8 kg     Physical Examination:    General: Appears in no acute distress  Cardiovascular: S1-S2, regular, no murmur auscultated  Respiratory:  Clear to auscultation bilaterally  Abdomen: Abdomen is soft, nontender, no organomegaly  Extremities: No edema of the lower extremities  Neurologic: Alert, oriented x2, no focal deficit noted.       Data Reviewed: I have personally reviewed following labs and imaging studies   Recent Results (from the past 240 hour(s))  MRSA PCR Screening     Status: Abnormal   Collection Time: 12/14/18  6:52 PM  Result Value Ref Range Status   MRSA by PCR POSITIVE (A) NEGATIVE Final    Comment:        The GeneXpert MRSA Assay (FDA approved for NASAL specimens only), is one component of a comprehensive MRSA colonization surveillance program. It is not intended to diagnose MRSA infection nor to guide or monitor treatment for MRSA infections. RESULT CALLED TO, READ BACK BY AND VERIFIED WITH: B RUDD RN 2047 12/14/18 A BROWNING Performed at [facility], [place] [address], [place], [place] [address]      Liver Function Tests: Recent Labs  Lab 12/15/18 0522  AST 11*  ALT 17  ALKPHOS 65  BILITOT 0.7  PROT 6.0*  ALBUMIN 3.4*   No results for input(s): LIPASE, AMYLASE in the last 168 hours. Recent Labs  Lab 12/15/18 0522  AMMONIA 30    Cardiac Enzymes: Recent Labs  Lab 12/14/18 1509 12/14/18 1921 12/14/18 2330 12/15/18 0522  TROPONINI 0.08* 0.09* 0.07* 0.07*   BNP (last 3 results) No results for input(s): BNP in the last 8760 hours.  ProBNP (last 3 results) No results for input(s): PROBNP in the last 8760 hours.    Studies: No results found.  Scheduled Meds: . busPIRone  5 mg Oral BID  . cetaphil   Topical BID  . Chlorhexidine Gluconate Cloth  6 each Topical Q0600  . heparin  5,000 Units Subcutaneous Q8H  . insulin aspart  0-15 Units Subcutaneous TID WC  . insulin aspart  0-5 Units Subcutaneous QHS  . insulin glargine  50 Units Subcutaneous Daily  . levETIRAcetam  250 mg Oral BID  . lisinopril  5 mg Oral Daily  . mupirocin ointment  1 application Nasal  BID  . QUEtiapine  100 mg Oral BID  . sertraline  100 mg Oral QHS  . valproic acid  500 mg Oral TID    Admission status: Inpatient: Based on patients clinical presentation and evaluation of above clinical data, I have made determination that patient meets Inpatient criteria at this time.  Time spent: 25 min  [facility] Pager [phone]. If 7PM-7AM, please contact night-coverage at [email or web address], Office  [phone]  password [email or web address]  12/18/2018, 12:07 PM  LOS: 3 days

## 2018-12-19 ENCOUNTER — Encounter (HOSPITAL_COMMUNITY): Payer: Self-pay

## 2018-12-19 LAB — GLUCOSE, CAPILLARY
Glucose-Capillary: 53 mg/dL — ABNORMAL LOW (ref 70–99)
Glucose-Capillary: 59 mg/dL — ABNORMAL LOW (ref 70–99)
Glucose-Capillary: 60 mg/dL — ABNORMAL LOW (ref 70–99)
Glucose-Capillary: 86 mg/dL (ref 70–99)
Glucose-Capillary: 111 mg/dL — ABNORMAL HIGH (ref 70–99)
Glucose-Capillary: 89 mg/dL (ref 70–99)
Glucose-Capillary: 137 mg/dL — ABNORMAL HIGH (ref 70–99)

## 2018-12-19 MED ORDER — IPRATROPIUM-ALBUTEROL 0.5-2.5 (3) MG/3ML IN SOLN: 3.0000 mL | Freq: Four times a day (QID) | RESPIRATORY_TRACT | Status: DC | PRN

## 2018-12-19 MED ORDER — IPRATROPIUM-ALBUTEROL 0.5-2.5 (3) MG/3ML IN SOLN: 3.0000 mL | Freq: Four times a day (QID) | RESPIRATORY_TRACT | Status: DC

## 2018-12-19 MED ORDER — DIVALPROEX SODIUM 125 MG PO CSDR
500.0000 mg | DELAYED_RELEASE_CAPSULE | Freq: Three times a day (TID) | ORAL | Status: DC
  Administered 2018-12-19 – 2018-12-24 (×14): 500 mg via ORAL
  Filled 2018-12-19 (×16): qty 4

## 2018-12-19 MED ORDER — DIVALPROEX SODIUM 125 MG PO CSDR: 500.0000 mg | DELAYED_RELEASE_CAPSULE | Freq: Three times a day (TID) | ORAL | Status: DC

## 2018-12-19 NOTE — Progress Notes (Signed)
Page to MD    3e16 ?Proceed or Hold Lantus? (given hypoglycemic episode this morning)

## 2018-12-19 NOTE — Progress Notes (Addendum)
Pt upright on side of bed, eating breakfast. IV access obtained. Will recheck CBG after meal.     Follow up CBG 86

## 2018-12-19 NOTE — Progress Notes (Signed)
Hypoglycemic Event  CBG: 53  Treatment: 2 cups of juice  Symptoms: none  Follow-up CBG: Time: 0706 CBG Result:60  Possible Reasons for Event:   Comments/MD notified:Patient breakfast arrived at this time.  Oncoming RN will recheck CBG after breakfast.  Pt alert, oriented, and talking.      [place]

## 2018-12-19 NOTE — Progress Notes (Signed)
[facility]  PROGRESS NOTE  William [name] WUJ:[id number] DOB: [birth year] DOA: 12/14/2018 PCP: Patient, No Pcp Per   Brief HPI:   73 year old male with history of dementia, hypertension, seizure disorder, chronic kidney disease stage III, diabetes mellitus type 2 on insulin, alcohol abuse in the past, depression was brought to the ED from skilled nursing facility for altered mental status.  As per ED notes patient was hard to arouse at the nursing facility and CPR was performed less than a minute after which patient woke up and told them to stop.  Patient was sent to ED.  Cardiology was consulted.  Cardiology has signed off and does not feel that patient had cardiac arrest.    Subjective   Patient seen and examined, denies chest pain or shortness of breath.  Awaiting to go to skilled nursing facility.   Assessment/Plan:    1. Altered mental status/episode of unresponsiveness-resolved, cardiology does not feel that patient had cardiac arrest.  Patient's EEG is consistent with a region of of potential epileptogenecity in the left frontotemporal region.  Patient was on Depakote at skilled facility, will obtain Depakote level.  Started on  Keppra 250 mg p.o. twice daily.  2. ?  Cardiac arrest-cardiology has signed off and does not feel that patient had cardiac arrest.  He only had mild elevation of troponin, flat trend.   3. History of seizures-patient is on valproic acid, valproic acid level 29.?  Noncompliance with taking valproate, continue valproic acid 500 mg p.o. 3 times daily, started on Keppra 250 mg p.o. twice daily.  4. Chronic kidney disease stage III-creatinine at baseline.  5. Dementia-with behavior disturbance, Perative care consulted.Start Ativan as needed for agitation.  6. Hypertension-blood pressure is stable, continue lisinopril.  7. Rash/dermatitis-improved, patient had erythematous papular rash on left arm, left side of the face.  Complains of itching.   Continue Cetaphil lotion, started  hydroxyzine 10 mg 3 times daily as needed  8. Diabetes mellitus type 2-blood was is controlled, continue sliding scale insulin with NovoLog.  Will discontinue Lantus as patient became hypoglycemic this morning.     CBG: Recent Labs  Lab 12/18/18 2042 12/19/18 0618 12/19/18 0644 12/19/18 0706 12/19/18 0826  GLUCAP 94 53* 59* 60* 86    CBC: Recent Labs  Lab 12/14/18 1509 12/14/18 1523 12/15/18 0522  WBC 8.6  --  6.7  NEUTROABS 6.2  --   --   HGB 12.8* 12.9* 13.2  HCT 40.6 38.0* 40.4  MCV 89.6  --  89.4  PLT 212  --  956    Basic Metabolic Panel: Recent Labs  Lab 12/14/18 1509 12/14/18 1523 12/15/18 0522  NA 133* 131* 137  K 5.1 5.1 4.8  CL 100  --  102  CO2 20*  --  23  GLUCOSE 331*  --  205*  BUN 53*  --  56*  CREATININE 2.44*  --  2.83*  CALCIUM 9.4  --  9.2  MG  --   --  1.8     DVT prophylaxis: Heparin  Code Status: Full code  Family Communication: Palliative care discussed with family, surrogate decision-maker for patient and she wants a different skilled facility.  Clinical social worker is working on finding a different skilled facility.  Disposition Plan: likely skilled nursing facility in 1 to 2 days.     Consultants:  Cardiology  Procedures:  EEG   Antibiotics:   Anti-infectives (From admission, onward)   None  Objective   Vitals:   12/19/18 0622 12/19/18 0646 12/19/18 0759 12/19/18 1023  BP: (!) 95/59 (!) 95/59  (!) 144/92  Pulse: 66 66  75  Resp: 18 18    Temp: (!) 97.5 F (36.4 C) (!) 97.5 F (36.4 C)    TempSrc: Oral Oral    SpO2: 94% 94%    Weight:      Height:   5\' 8"  (1.727 m)     Intake/Output Summary (Last 24 hours) at 12/19/2018 1119 Last data filed at 12/19/2018 0923 Gross per 24 hour  Intake 480 ml  Output 950 ml  Net -470 ml   Filed Weights   12/17/18 0100 12/18/18 0558 12/19/18 0038  Weight: 95.3 kg 94.8 kg 98 kg     Physical Examination:   General:  Appears in no acute distress  Cardiovascular: S1-S2, regular, no murmur auscultated  Respiratory: Clear to auscultation bilaterally  Abdomen: Abdomen is soft, nontender, no organomegaly  Extremities: No edema of the lower extremities  Neurologic: Alert, oriented x2, no focal deficit noted        Data Reviewed: I have personally reviewed following labs and imaging studies   Recent Results (from the past 240 hour(s))  MRSA PCR Screening     Status: Abnormal   Collection Time: 12/14/18  6:52 PM  Result Value Ref Range Status   MRSA by PCR POSITIVE (A) NEGATIVE Final    Comment:        The GeneXpert MRSA Assay (FDA approved for NASAL specimens only), is one component of a comprehensive MRSA colonization surveillance program. It is not intended to diagnose MRSA infection nor to guide or monitor treatment for MRSA infections. RESULT CALLED TO, READ BACK BY AND VERIFIED WITH: B RUDD RN 2047 12/14/18 A BROWNING Performed at [facility], [place] [address] [place], [place] [address]      Liver Function Tests: Recent Labs  Lab 12/15/18 0522  AST 11*  ALT 17  ALKPHOS 65  BILITOT 0.7  PROT 6.0*  ALBUMIN 3.4*   No results for input(s): LIPASE, AMYLASE in the last 168 hours. Recent Labs  Lab 12/15/18 0522  AMMONIA 30    Cardiac Enzymes: Recent Labs  Lab 12/14/18 1509 12/14/18 1921 12/14/18 2330 12/15/18 0522  TROPONINI 0.08* 0.09* 0.07* 0.07*   BNP (last 3 results) No results for input(s): BNP in the last 8760 hours.  ProBNP (last 3 results) No results for input(s): PROBNP in the last 8760 hours.    Studies: No results found.  Scheduled Meds: . busPIRone  5 mg Oral BID  . cetaphil   Topical BID  . Chlorhexidine Gluconate Cloth  6 each Topical Q0600  . heparin  5,000 Units Subcutaneous Q8H  . insulin aspart  0-15 Units Subcutaneous TID WC  . insulin aspart  0-5 Units Subcutaneous QHS  . levETIRAcetam  250 mg Oral BID  . lisinopril  5 mg Oral  Daily  . QUEtiapine  100 mg Oral BID  . sertraline  100 mg Oral QHS  . valproic acid  500 mg Oral TID    Admission status: Inpatient: Based on patients clinical presentation and evaluation of above clinical data, I have made determination that patient meets Inpatient criteria at this time.  Time spent: 25 min  [facility] Pager [phone]. If 7PM-7AM, please contact night-coverage at [email or web address], Office  [phone]  password [email or web address]  12/19/2018, 11:19 AM  LOS: 4 days

## 2018-12-19 NOTE — Progress Notes (Signed)
Patient resting comfortably during shift report. Denies complaints.  

## 2018-12-19 NOTE — Plan of Care (Signed)
  Problem: Activity: Goal: Risk for activity intolerance will decrease Outcome: Progressing   Problem: Coping: Goal: Level of anxiety will decrease Outcome: Progressing   Problem: Safety: Goal: Ability to remain free from injury will improve Outcome: Progressing   

## 2018-12-20 ENCOUNTER — Inpatient Hospital Stay (HOSPITAL_COMMUNITY): Payer: Medicare Other

## 2018-12-20 LAB — GLUCOSE, CAPILLARY
Glucose-Capillary: 116 mg/dL — ABNORMAL HIGH (ref 70–99)
Glucose-Capillary: 232 mg/dL — ABNORMAL HIGH (ref 70–99)
Glucose-Capillary: 293 mg/dL — ABNORMAL HIGH (ref 70–99)

## 2018-12-20 LAB — BASIC METABOLIC PANEL
Anion gap: 9 (ref 5–15)
BUN: 59 mg/dL — ABNORMAL HIGH (ref 8–23)
CALCIUM: 8.6 mg/dL — AB (ref 8.9–10.3)
CO2: 22 mmol/L (ref 22–32)
Chloride: 101 mmol/L (ref 98–111)
Creatinine, Ser: 3.76 mg/dL — ABNORMAL HIGH (ref 0.61–1.24)
GFR calc Af Amer: 17 mL/min — ABNORMAL LOW (ref >60)
GFR calc non Af Amer: 15 mL/min — ABNORMAL LOW (ref >60)
Glucose, Bld: 92 mg/dL (ref 70–99)
Potassium: 5.6 mmol/L — ABNORMAL HIGH (ref 3.5–5.1)
Sodium: 132 mmol/L — ABNORMAL LOW (ref 135–145)

## 2018-12-20 MED ORDER — IPRATROPIUM-ALBUTEROL 0.5-2.5 (3) MG/3ML IN SOLN: 3.0000 mL | Freq: Two times a day (BID) | RESPIRATORY_TRACT | Status: DC

## 2018-12-20 MED ORDER — ADULT MULTIVITAMIN W/MINERALS CH
1.0000 | ORAL_TABLET | Freq: Every day | ORAL | Status: DC
  Administered 2018-12-20 – 2018-12-24 (×5): 1 via ORAL
  Filled 2018-12-20 (×6): qty 1

## 2018-12-20 MED ORDER — FOLIC ACID 1 MG PO TABS
1.0000 mg | ORAL_TABLET | Freq: Every day | ORAL | Status: DC
  Administered 2018-12-20 – 2018-12-24 (×5): 1 mg via ORAL
  Filled 2018-12-20 (×6): qty 1

## 2018-12-20 MED ORDER — IPRATROPIUM-ALBUTEROL 0.5-2.5 (3) MG/3ML IN SOLN
3.0000 mL | Freq: Four times a day (QID) | RESPIRATORY_TRACT | Status: DC
  Administered 2018-12-20 (×2): 3 mL via RESPIRATORY_TRACT
  Filled 2018-12-20 (×2): qty 3

## 2018-12-20 MED ORDER — IPRATROPIUM-ALBUTEROL 0.5-2.5 (3) MG/3ML IN SOLN
3.0000 mL | Freq: Four times a day (QID) | RESPIRATORY_TRACT | Status: DC
  Administered 2018-12-20: 3 mL via RESPIRATORY_TRACT
  Filled 2018-12-20: qty 3

## 2018-12-20 MED ORDER — IPRATROPIUM-ALBUTEROL 0.5-2.5 (3) MG/3ML IN SOLN
3.0000 mL | Freq: Two times a day (BID) | RESPIRATORY_TRACT | Status: DC
  Administered 2018-12-21 – 2018-12-24 (×7): 3 mL via RESPIRATORY_TRACT
  Filled 2018-12-20 (×7): qty 3

## 2018-12-20 MED ORDER — PREDNISONE 20 MG PO TABS
40.0000 mg | ORAL_TABLET | Freq: Every day | ORAL | Status: DC
  Administered 2018-12-20 – 2018-12-23 (×4): 40 mg via ORAL
  Filled 2018-12-20 (×4): qty 2

## 2018-12-20 MED ORDER — ENSURE ENLIVE PO LIQD
237.0000 mL | Freq: Two times a day (BID) | ORAL | Status: DC
  Administered 2018-12-20 – 2018-12-22 (×4): 237 mL via ORAL

## 2018-12-20 NOTE — Progress Notes (Signed)
Physical Therapy Treatment Patient Details Name: William [name] MRN: [id number] DOB: [birth year] Today's Date: 12/20/2018    History of Present Illness Pt is a 73 y.o. long-term resident at [facility] admitted on 12/14/18 with unresponsiveness requiring brief CPR (<1 min). Head CT negative for acute findings; additional workup pedning. CXR without acute disease. PMH includes DM, seizures, HTN, hearing impairment, cataracts, CKD, alcohol abuse, probable dementia.    PT Comments    Patient seen for mobility progression. Requires increased cueing and motivation to participate with therapy. Patient performing bed mobility and transfer to recliner with Min A - does require consistent cueing for safety and sequencing. Patient prematurely sitting increasing his fall risk. PT to continue to recommend SNF at discharge. PT to continue to follow acutely.     Follow Up Recommendations  SNF;Supervision/Assistance - 24 hour     Equipment Recommendations  None recommended by PT    Recommendations for Other Services       Precautions / Restrictions Precautions Precautions: Fall Restrictions Weight Bearing Restrictions: No    Mobility  Bed Mobility Overal bed mobility: Needs Assistance Bed Mobility: Supine to Sit     Supine to sit: Min assist;Min guard     General bed mobility comments: patient offered assist from PT - choosing to perform with min guard - increased time and effort; very light Min A for trunk control at EOB  Transfers Overall transfer level: Needs assistance Equipment used: Rolling walker (2 wheeled) Transfers: Sit to/from Stand Sit to Stand: Min assist;From elevated surface         General transfer comment: Min A to stand at bedside; noted posterior bias upon initial standing  Ambulation/Gait Ambulation/Gait assistance: Min guard Gait Distance (Feet): 2 Feet Assistive device: Rolling walker (2 wheeled) Gait Pattern/deviations: Step-to pattern;Shuffle;Trunk  flexed Gait velocity: Decreased   General Gait Details: able to take side shuffle steps towards reclienr - did not push for further mobility as patient was reluctant to get out of bed; premature sitting increasing his fall risk   Stairs             Wheelchair Mobility    Modified Rankin (Stroke Patients Only)       Balance Overall balance assessment: Needs assistance Sitting-balance support: Single extremity supported;Feet supported Sitting balance-Leahy Scale: Good     Standing balance support: Bilateral upper extremity supported;During functional activity Standing balance-Leahy Scale: Poor Standing balance comment: reliant on RW                            Cognition Arousal/Alertness: Awake/alert Behavior During Therapy: WFL for tasks assessed/performed Overall Cognitive Status: History of cognitive impairments - at baseline                                 General Comments: Per chart, h/o dementia; also very HOH. Pt answering majority of questions appropriately and following simple commands, but easily becomes agitated and verbally aggressive. Able to be somewhat redirected, although this is inconsistent      Exercises      General Comments General comments (skin integrity, edema, etc.): requires increased motivation and reduced attention to task to perform OOB; patient requires linen change as his bed was soiled      Pertinent Vitals/Pain Pain Assessment: No/denies pain    Home Living  Prior Function            PT Goals (current goals can now be found in the care plan section) Acute Rehab PT Goals Patient Stated Goal: "I want some coffee" PT Goal Formulation: With patient Time For Goal Achievement: 12/29/18 Potential to Achieve Goals: Fair Progress towards PT goals: Progressing toward goals    Frequency    Min 2X/week      PT Plan Current plan remains appropriate    Co-evaluation               AM-PAC PT "6 Clicks" Mobility   Outcome Measure  Help needed turning from your back to your side while in a flat bed without using bedrails?: A Little Help needed moving from lying on your back to sitting on the side of a flat bed without using bedrails?: A Little Help needed moving to and from a bed to a chair (including a wheelchair)?: A Little Help needed standing up from a chair using your arms (e.g., wheelchair or bedside chair)?: A Little Help needed to walk in hospital room?: A Little Help needed climbing 3-5 steps with a railing? : A Lot 6 Click Score: 17    End of Session Equipment Utilized During Treatment: Gait belt Activity Tolerance: Patient tolerated treatment well Patient left: in chair;with call bell/phone within reach;with chair alarm set Nurse Communication: Mobility status PT Visit Diagnosis: Other abnormalities of gait and mobility (R26.89)     Time: 1540-1605 PT Time Calculation (min) (ACUTE ONLY): 25 min  Charges:  $Gait Training: 8-22 mins $Therapeutic Activity: 8-22 mins                     Lanney Gins, PT, DPT Supplemental Physical Therapist 12/20/18 4:18 PM Pager: [phone] Office: [phone]

## 2018-12-20 NOTE — Progress Notes (Signed)
[facility]  PROGRESS NOTE  William [name] XLK:[id number] DOB: [birth year] DOA: 12/14/2018 PCP: Patient, No Pcp Per   Brief HPI:   73 year old male with history of dementia, hypertension, seizure disorder, chronic kidney disease stage III, diabetes mellitus type 2 on insulin, alcohol abuse in the past, depression was brought to the ED from skilled nursing facility for altered mental status.  As per ED notes patient was hard to arouse at the nursing facility and CPR was performed less than a minute after which patient woke up and told them to stop.  Patient was sent to ED.  Cardiology was consulted.  Cardiology has signed off and does not feel that patient had cardiac arrest.    Subjective   Patient seen and examined, denies chest pain or shortness of breath.  Awaiting to go to skilled facility.   Assessment/Plan:    1. Altered mental status/episode of unresponsiveness-resolved, cardiology does not feel that patient had cardiac arrest.  Patient's EEG is consistent with a region of of potential epileptogenecity in the left frontotemporal region.  Patient was on Depakote at skilled facility, will obtain Depakote level.  Started on  Keppra 250 mg p.o. twice daily.  2. ? Cardiac arrest-cardiology has signed off and does not feel that patient had cardiac arrest.  He only had mild elevation of troponin, flat trend.   3. History of seizures-patient is on valproic acid, valproic acid level 29.?  Noncompliance with taking valproate, continue valproic acid 500 mg p.o. 3 times daily, started on Keppra 250 mg p.o. twice daily.  4. Chronic kidney disease stage III-patient's baseline creatinine is around 2.3.  Slight worsening of creatinin at 2.83.  Will check BMP this morning.  5. Dementia-with behavior disturbance, Palliative  care consulted.Started Ativan as needed for agitation.  6. Hypertension-blood pressure is stable, continue lisinopril.  7. Rash/dermatitis-improved, patient had  erythematous papular rash on left arm, left side of the face.  Complains of itching.  Continue Cetaphil lotion, started  hydroxyzine 10 mg 3 times daily as needed  8. Diabetes mellitus type 2-blood was is controlled, continue sliding scale insulin with NovoLog.  Will discontinue Lantus as patient became hypoglycemic this morning.  9. Acute bronchitis-patient started on DuoNeb nebulizers every 6 hours.  Will add prednisone 40 mg p.o. daily.  Patient can be discharged on prednisone taper.     CBG: Recent Labs  Lab 12/19/18 0826 12/19/18 1208 12/19/18 1651 12/19/18 2054 12/20/18 0558  GLUCAP 86 111* 89 137* 116*    CBC: Recent Labs  Lab 12/14/18 1509 12/14/18 1523 12/15/18 0522  WBC 8.6  --  6.7  NEUTROABS 6.2  --   --   HGB 12.8* 12.9* 13.2  HCT 40.6 38.0* 40.4  MCV 89.6  --  89.4  PLT 212  --  366    Basic Metabolic Panel: Recent Labs  Lab 12/14/18 1509 12/14/18 1523 12/15/18 0522  NA 133* 131* 137  K 5.1 5.1 4.8  CL 100  --  102  CO2 20*  --  23  GLUCOSE 331*  --  205*  BUN 53*  --  56*  CREATININE 2.44*  --  2.83*  CALCIUM 9.4  --  9.2  MG  --   --  1.8     DVT prophylaxis: Heparin  Code Status: Full code  Family Communication: Palliative care discussed with family, surrogate decision-maker for patient and she wants a different skilled facility.  Clinical social worker is working on finding a different skilled  facility.  Disposition Plan: likely skilled nursing facility in 1 to 2 days.     Consultants:  Cardiology  Procedures:  EEG   Antibiotics:   Anti-infectives (From admission, onward)   None       Objective   Vitals:   12/19/18 0759 12/19/18 1023 12/19/18 1946 12/20/18 0600  BP:  (!) 144/92 137/80 (!) 128/92  Pulse:  75 66 75  Resp:   18 18  Temp:   (!) 97.5 F (36.4 C) 98.4 F (36.9 C)  TempSrc:   Oral Oral  SpO2:   100% 93%  Weight:    98.9 kg  Height: 5\' 8"  (1.727 m)       Intake/Output Summary (Last 24 hours) at  12/20/2018 0936 Last data filed at 12/20/2018 0601 Gross per 24 hour  Intake 1200 ml  Output 1000 ml  Net 200 ml   Filed Weights   12/18/18 0558 12/19/18 0038 12/20/18 0600  Weight: 94.8 kg 98 kg 98.9 kg     Physical Examination:   General:  Appears in no acute distress. Cardiovascular: S1-S2, regular, no murmur auscultated. Respiratory: b/l rhonchi Abdomen: Abdomen is soft, nontender, no organomegaly. Extremities: No edema of the lower extremities. Neurologic: Alert, oriented x2, no focal deficit noted.  Moving all extremities.    Data Reviewed: I have personally reviewed following labs and imaging studies   Recent Results (from the past 240 hour(s))  MRSA PCR Screening     Status: Abnormal   Collection Time: 12/14/18  6:52 PM  Result Value Ref Range Status   MRSA by PCR POSITIVE (A) NEGATIVE Final    Comment:        The GeneXpert MRSA Assay (FDA approved for NASAL specimens only), is one component of a comprehensive MRSA colonization surveillance program. It is not intended to diagnose MRSA infection nor to guide or monitor treatment for MRSA infections. RESULT CALLED TO, READ BACK BY AND VERIFIED WITH: B RUDD RN 2047 12/14/18 A BROWNING Performed at [facility], [place] [address], [place], [place] [address]      Liver Function Tests: Recent Labs  Lab 12/15/18 0522  AST 11*  ALT 17  ALKPHOS 65  BILITOT 0.7  PROT 6.0*  ALBUMIN 3.4*   No results for input(s): LIPASE, AMYLASE in the last 168 hours. Recent Labs  Lab 12/15/18 0522  AMMONIA 30    Cardiac Enzymes: Recent Labs  Lab 12/14/18 1509 12/14/18 1921 12/14/18 2330 12/15/18 0522  TROPONINI 0.08* 0.09* 0.07* 0.07*   BNP (last 3 results) No results for input(s): BNP in the last 8760 hours.  ProBNP (last 3 results) No results for input(s): PROBNP in the last 8760 hours.    Studies: No results found.  Scheduled Meds: . busPIRone  5 mg Oral BID  . cetaphil   Topical BID  .  divalproex  500 mg Oral Q8H  . heparin  5,000 Units Subcutaneous Q8H  . insulin aspart  0-15 Units Subcutaneous TID WC  . insulin aspart  0-5 Units Subcutaneous QHS  . levETIRAcetam  250 mg Oral BID  . lisinopril  5 mg Oral Daily  . QUEtiapine  100 mg Oral BID  . sertraline  100 mg Oral QHS    Admission status: Inpatient: Based on patients clinical presentation and evaluation of above clinical data, I have made determination that patient meets Inpatient criteria at this time.  Time spent: 25 min  [facility] Pager [phone]. If 7PM-7AM,  please contact night-coverage at [email or web address], Office  [phone]  password [facility]  12/20/2018, 9:36 AM  LOS: 5 days

## 2018-12-20 NOTE — TOC Progression Note (Addendum)
Transition of Care [facility]) - Progression Note    Patient Details  Name: NEGAN GRUDZIEN MRN: [id number] Date of Birth: 29-Mar-1946  Transition of Care [facility]) CM/SW [place], LCSW Phone Number: 12/20/2018, 9:37 AM  Clinical Narrative:  Left voicemail for [name]. Patient's only other bed offer (other than [facility]) for LTC is still [facility]. Need to discuss this with [name] before discharge.  1:30 pm: No call back from [name]. Tried calling again. Didn't leave another voicemail.  3:14 pm: Called [name] daughter, Jeani Hawking. She stated [name] is on a lot of medication and typically sleeps till 6/7:00 pm. Made her aware of bed offer from [facility] as well as their star ratings. She will update her mother tonight with this information and figure out a way to get to the facility to complete paperwork.  Expected Discharge Plan: [place] Barriers to Discharge: Continued Medical Work up  Expected Discharge Plan and Services Expected Discharge Plan: [place] Choice: [place] arrangements for the past 2 months: [place]                           Social Determinants of Health (SDOH) Interventions    Readmission Risk Interventions No flowsheet data found.

## 2018-12-20 NOTE — Progress Notes (Signed)
Noted labs this morning, serum creatinine has risen to 3.96.  Potassium was 5.6.  Patient has not been getting IV fluids as he has been pulling off his IVs.  I have asked the nursing staff to reinsert IV and continue with normal saline at 100 mL/h.  Will check BMP in a.m.

## 2018-12-20 NOTE — Progress Notes (Signed)
Initial Nutrition Assessment  DOCUMENTATION CODES:   Obesity unspecified  INTERVENTION:  Ensure Enlive po BID, each supplement provides 350 kcal and 20 grams of protein Magic cup TID with meals, each supplement provides 290 kcal and 9 grams of protein MVI   NUTRITION DIAGNOSIS:   Inadequate oral intake related to decreased appetite, lethargy/confusion(AMS) as evidenced by meal completion < 50%, other (comment)(dislike of facility food per pt report).   GOAL:   Patient will meet greater than or equal to 90% of their needs   MONITOR:   PO intake, Supplement acceptance, Weight trends  REASON FOR ASSESSMENT:   Malnutrition Screening Tool    ASSESSMENT:  73 year old male with AMS presented to ED from nursing facility after patient was reported hard to arouse and CPR was done for less than 1 minute before patient woke up and told them to stop. PMH includes EtOH abuse, CKD III, T2DM, and hearing impairment  Patient is hard of hearing and poor historian  Patient with lunch tray at bedside and reports that he would feel much better if he was out of this place. Nurse in room replacing patients IV that patient has removed. Nurse reports that patient is not eating much, patient stated that he does not like facility food. He reports cooking for himself at home and liking fried eggs for breakfast, fried bologna sandwiches and chicken.   Medications reviewed and include: Depakote, Folic acid, SSI, Prednisone  Labs: Na 132 (L) Potassium 5.6 (H) Mean Plasma Glucose 171.42  NUTRITION - FOCUSED PHYSICAL EXAM:    Diet Order:   Diet Order            DIET DYS 3 Room service appropriate? Yes; Fluid consistency: Thin  Diet effective now              EDUCATION NEEDS:   Not appropriate for education at this time  Skin:  Skin Assessment: Reviewed RN Assessment  Last BM:  3/22  Height:   Ht Readings from Last 1 Encounters:  12/19/18 5\' 8"  (2.637 m)    Weight:   Wt  Readings from Last 1 Encounters:  12/20/18 98.9 kg    Ideal Body Weight:  70 kg  BMI:  Body mass index is 33.15 kg/m.  Estimated Nutritional Needs:   Kcal:  2100-2300  Protein:  115-135 grams  Fluid:  >2.3L/day    Lajuan Lines, RD, LDN  After Hours/Weekend Pager: [phone]

## 2018-12-21 ENCOUNTER — Inpatient Hospital Stay (HOSPITAL_COMMUNITY): Payer: Medicare Other

## 2018-12-21 DIAGNOSIS — R062 Wheezing: Secondary | ICD-10-CM

## 2018-12-21 DIAGNOSIS — N179 Acute kidney failure, unspecified: Secondary | ICD-10-CM

## 2018-12-21 LAB — GLUCOSE, CAPILLARY
Glucose-Capillary: 228 mg/dL — ABNORMAL HIGH (ref 70–99)
Glucose-Capillary: 238 mg/dL — ABNORMAL HIGH (ref 70–99)
Glucose-Capillary: 277 mg/dL — ABNORMAL HIGH (ref 70–99)
Glucose-Capillary: 209 mg/dL — ABNORMAL HIGH (ref 70–99)

## 2018-12-21 LAB — BASIC METABOLIC PANEL
Anion gap: 11 (ref 5–15)
BUN: 54 mg/dL — ABNORMAL HIGH (ref 8–23)
CO2: 18 mmol/L — ABNORMAL LOW (ref 22–32)
Calcium: 8.3 mg/dL — ABNORMAL LOW (ref 8.9–10.3)
Chloride: 100 mmol/L (ref 98–111)
Creatinine, Ser: 3.21 mg/dL — ABNORMAL HIGH (ref 0.61–1.24)
GFR calc Af Amer: 21 mL/min — ABNORMAL LOW (ref >60)
GFR calc non Af Amer: 18 mL/min — ABNORMAL LOW (ref >60)
Glucose, Bld: 256 mg/dL — ABNORMAL HIGH (ref 70–99)
Potassium: 6.7 mmol/L (ref 3.5–5.1)
Sodium: 129 mmol/L — ABNORMAL LOW (ref 135–145)

## 2018-12-21 LAB — SODIUM, URINE, RANDOM: Sodium, Ur: 25 mmol/L

## 2018-12-21 LAB — CREATININE, URINE, RANDOM: Creatinine, Urine: 37.26 mg/dL

## 2018-12-21 LAB — PROTEIN / CREATININE RATIO, URINE
CREATININE, URINE: 37.1 mg/dL
Protein Creatinine Ratio: 0.62 mg/mg{Cre} — ABNORMAL HIGH (ref 0.00–0.15)
TOTAL PROTEIN, URINE: 23 mg/dL

## 2018-12-21 MED ORDER — INSULIN GLARGINE 100 UNIT/ML ~~LOC~~ SOLN
15.0000 [IU] | Freq: Every day | SUBCUTANEOUS | Status: DC
  Administered 2018-12-21 – 2018-12-23 (×3): 15 [IU] via SUBCUTANEOUS
  Filled 2018-12-21 (×3): qty 0.15

## 2018-12-21 MED ORDER — INSULIN GLARGINE 100 UNIT/ML ~~LOC~~ SOLN: 5.0000 [IU] | Freq: Every day | SUBCUTANEOUS | Status: DC

## 2018-12-21 MED ORDER — CALCIUM GLUCONATE-NACL 1-0.675 GM/50ML-% IV SOLN
1.0000 g | Freq: Once | INTRAVENOUS | Status: AC
  Administered 2018-12-21: 1000 mg via INTRAVENOUS
  Filled 2018-12-21: qty 50

## 2018-12-21 MED ORDER — SODIUM ZIRCONIUM CYCLOSILICATE 10 G PO PACK: 10.0000 g | PACK | Freq: Once | ORAL | Status: DC

## 2018-12-21 MED ORDER — SODIUM CHLORIDE 0.9 % IV SOLN
INTRAVENOUS | Status: DC
  Administered 2018-12-22: 02:00:00 via INTRAVENOUS

## 2018-12-21 MED ORDER — SODIUM POLYSTYRENE SULFONATE 15 GM/60ML PO SUSP
30.0000 g | Freq: Once | ORAL | Status: AC
  Administered 2018-12-21: 30 g via ORAL
  Filled 2018-12-21: qty 120

## 2018-12-21 NOTE — Progress Notes (Addendum)
Inpatient Diabetes Program Recommendations  AACE/ADA: New Consensus Statement on Inpatient Glycemic Control (2015)  Target Ranges:  Prepandial:   less than 140 mg/dL      Peak postprandial:   less than 180 mg/dL (1-2 hours)      Critically ill patients:  140 - 180 mg/dL   Lab Results  Component Value Date   GLUCAP 238 (H) 12/21/2018   HGBA1C 7.6 (H) 12/15/2018    Review of Glycemic Control Results for STEVENS, MAGWOOD" (MRN [id number]) as of 12/21/2018 13:15  Ref. Range 12/20/2018 05:58 12/20/2018 16:46 12/20/2018 21:29 12/21/2018 06:23 12/21/2018 11:53  Glucose-Capillary Latest Ref Range: 70 - 99 mg/dL 116 (H) 232 (H) 293 (H) 228 (H) 238 (H)   Diabetes history: DM 2 Outpatient Diabetes medications:  Novolog 10 units tid with meals, Toujeo- 0-180 units- No basal 181-450- Give 165 units daily, Victoza 1.2 mg daily Current orders for Inpatient glycemic control:  Novolog moderate tid with meals and HS Lantus 5 units q HS Prednisone 40 mg daily Inpatient Diabetes Program Recommendations:   May consider increasing Lantus to 15 units daily. May consider adding Novolog 3 units tid with meals (meal coverage) while patient is on Prednisone.  Thanks,  Adah Perl, RN, BC-ADM Inpatient Diabetes Coordinator Pager [phone] (8a-5p)

## 2018-12-21 NOTE — Plan of Care (Signed)
  Problem: Elimination: Goal: Will not experience complications related to bowel motility Outcome: Progressing   

## 2018-12-21 NOTE — Consult Note (Signed)
Reason for Consult: Hyperkalemia Referring Physician:  Dr. Darrick Meigs  Chief Complaint:  AMS  Assessment/Plan: 1. Hyperkalemia - May be from the heparin + Lisinopril; heparin decreased aldosterone synthesis + prerenal azotemia. - Will check renal ultrasound for a baseline and check for incr echogenicity. - If possible consider changing over to SCD and stopping the heparin. - Will also give Lokelma 10mg  PO x1 next time if still hyperkalemic. - Hydration NS x1 liter which will increase distal Na/K exchange as well. - Will check UPC and urine microscopy for any clues to reason he has progressed. Most common cause would be from the DM and HTN. 2. AKI on CKD IIIB - baseline cr likely in the 2.3-2.6 range as he's progressed since earlier in 2019.  3. AMS 4. HTN 5. DM 6. Dementia 7. H/o Seizures - on depakote and Keppra   HPI: William [name] is an 73 y.o. male  With Dementia, HTN, seizures, CKD3, DM, presenting from NH w/ AMS. He was actually difficult to arouse at the NH leading to CPR for under a minute at which time the pt woke up. Very questionable cardiac arrest and cardiology was not convinced. Pt BL Cr 1.2-1.5 range in early 2019 and then 2.3 in 06/04/2018.  At admission the Cr was 2.44 on 3/17 before incr to 2.83 on 3/18 and then 3.76 3/23 before decr to 3.21 on 12/21/2018 after hydration. Cumulative net is -259 during hospitalization but he has a condom cath on. Echo shows EF of 40%.   ROS Pertinent items are noted in HPI.  Chemistry and CBC: Creatinine  Date/Time Value Ref Range Status  01/05/2018 1.2 0.6 - 1.3 Final  12/10/2017 1.3 0.6 - 1.3 Final  12/08/2017 1.2 0.6 - 1.3 Final  12/07/2017 1.3 0.6 - 1.3 Final  11/26/2017 1.3 0.6 - 1.3 Final  11/24/2017 1.2 0.6 - 1.3 Final  11/23/2017 1.2 0.6 - 1.3 Final  09/25/2017 1.3 0.6 - 1.3 Final  09/18/2017 1.2 0.6 - 1.3 Final  09/17/2017 1.2 0.6 - 1.3 Final  05/13/2017 1.2 0.6 - 1.3 Final  11/24/2016 1.3 0.6 - 1.3 mg/dL Final   11/14/2016 1.4 (A) 0.6 - 1.3 Final  08/02/2016 1.5 (A) 0.6 - 1.3 mg/dL Final  06/27/2016 1.4 (A) 0.6 - 1.3 mg/dL Final  06/17/2016 1.5 (A) 0.6 - 1.3 mg/dL Final  06/10/2016 2.3 (A) 0.6 - 1.3 mg/dL Final  05/09/2016 1.4 (A) 0.6 - 1.3 mg/dL Final  02/01/2016 1.4 (A) 0.6 - 1.3 mg/dL Final  01/04/2016 1.8 (A) 0.6 - 1.3 mg/dL Final   Creat  Date/Time Value Ref Range Status  01/11/2014 03:31 PM 1.08 0.50 - 1.35 mg/dL Final  01/06/2014 03:40 PM 1.46 (H) 0.50 - 1.35 mg/dL Final  06/04/2012 04:30 PM 1.28 0.50 - 1.35 mg/dL Final  11/11/2011 05:04 PM 1.02 0.50 - 1.35 mg/dL Final   Creatinine, Ser  Date/Time Value Ref Range Status  12/21/2018 04:16 AM 3.21 (H) 0.61 - 1.24 mg/dL Final  12/20/2018 10:06 AM 3.76 (H) 0.61 - 1.24 mg/dL Final  12/15/2018 05:22 AM 2.83 (H) 0.61 - 1.24 mg/dL Final  12/14/2018 03:09 PM 2.44 (H) 0.61 - 1.24 mg/dL Final  06/04/2018 11:36 PM 2.30 (H) 0.61 - 1.24 mg/dL Final  12/31/2017 08:06 AM 1.55 (H) 0.76 - 1.27 mg/dL Final  12/31/2016 09:10 AM 1.54 (H) 0.76 - 1.27 mg/dL Final  01/14/2016 10:02 AM 1.41 (H) 0.76 - 1.27 mg/dL Final  12/30/2015 04:55 PM 1.55 (H) 0.61 - 1.24 mg/dL Final  12/29/2015 10:40 PM 1.55 (  H) 0.61 - 1.24 mg/dL Final  11/21/2015 08:11 AM 1.53 (H) 0.61 - 1.24 mg/dL Final  11/20/2015 03:37 PM 1.33 (H) 0.61 - 1.24 mg/dL Final  11/20/2015 09:52 AM 1.40 (H) 0.61 - 1.24 mg/dL Final  11/20/2015 05:51 AM 1.20 0.61 - 1.24 mg/dL Final  11/19/2015 07:45 PM 1.28 (H) 0.61 - 1.24 mg/dL Final  10/10/2015 08:17 PM 1.38 (H) 0.61 - 1.24 mg/dL Final  05/07/2015 05:04 AM 1.19 0.61 - 1.24 mg/dL Final  05/06/2015 05:25 AM 1.28 (H) 0.61 - 1.24 mg/dL Final  05/05/2015 03:10 PM 1.34 (H) 0.61 - 1.24 mg/dL Final  05/05/2015 05:27 AM 1.09 0.61 - 1.24 mg/dL Final  05/04/2015 07:11 AM 1.20 0.61 - 1.24 mg/dL Final  05/04/2015 02:45 AM 1.32 (H) 0.61 - 1.24 mg/dL Final  02/20/2015 08:22 PM 1.41 (H) 0.61 - 1.24 mg/dL Final  02/16/2015 03:01 AM 1.77 (H) 0.61 - 1.24 mg/dL Final   02/27/2014 03:45 AM 1.26 0.50 - 1.35 mg/dL Final  02/26/2014 03:10 AM 1.76 (H) 0.50 - 1.35 mg/dL Final  02/25/2014 08:27 PM 2.70 (H) 0.50 - 1.35 mg/dL Final  02/25/2014 08:12 PM 2.22 (H) 0.50 - 1.35 mg/dL Final  12/17/2013 05:55 AM 1.10 0.50 - 1.35 mg/dL Final  12/16/2013 06:48 PM 1.08 0.50 - 1.35 mg/dL Final  12/16/2013 06:20 AM 1.15 0.50 - 1.35 mg/dL Final  12/15/2013 09:27 PM 1.29 0.50 - 1.35 mg/dL Final  12/15/2013 07:56 AM 1.15 0.50 - 1.35 mg/dL Final  12/14/2013 07:52 PM 1.13 0.50 - 1.35 mg/dL Final  05/11/2013 05:01 AM 1.01 0.50 - 1.35 mg/dL Final  05/10/2013 04:45 AM 1.02 0.50 - 1.35 mg/dL Final  05/09/2013 09:51 PM 1.05 0.50 - 1.35 mg/dL Final  02/05/2013 08:43 PM 1.07 0.50 - 1.35 mg/dL Final  09/03/2012 08:40 AM 1.60 (H) 0.50 - 1.35 mg/dL Final  09/02/2012 03:56 PM 2.01 (H) 0.50 - 1.35 mg/dL Final  08/22/2012 08:54 AM 0.96 0.50 - 1.35 mg/dL Final  08/21/2012 10:08 AM 1.41 (H) 0.50 - 1.35 mg/dL Final  08/21/2012 07:02 AM 1.49 (H) 0.50 - 1.35 mg/dL Final  08/21/2012 01:20 AM 1.83 (H) 0.50 - 1.35 mg/dL Final  08/20/2012 07:50 PM 2.07 (H) 0.50 - 1.35 mg/dL Final  05/30/2012 06:01 PM 1.31 0.50 - 1.35 mg/dL Final  05/30/2012 05:05 AM 1.63 (H) 0.50 - 1.35 mg/dL Final  05/29/2012 03:45 AM 1.32 0.50 - 1.35 mg/dL Final  05/28/2012 08:27 PM 1.27 0.50 - 1.35 mg/dL Final  05/28/2012 09:28 AM 1.49 (H) 0.50 - 1.35 mg/dL Final  09/01/2011 07:42 PM 1.01 0.50 - 1.35 mg/dL Final  03/18/2011 05:30 AM 1.10 0.50 - 1.35 mg/dL Final    Comment:    **Please note change in reference range.**   Recent Labs  Lab 12/14/18 1509 12/14/18 1523 12/15/18 0522 12/20/18 1006 12/21/18 0416  NA 133* 131* 137 132* 129*  K 5.1 5.1 4.8 5.6* 6.7*  CL 100  --  102 101 100  CO2 20*  --  23 22 18*  GLUCOSE 331*  --  205* 92 256*  BUN 53*  --  56* 59* 54*  CREATININE 2.44*  --  2.83* 3.76* 3.21*  CALCIUM 9.4  --  9.2 8.6* 8.3*   Recent Labs  Lab 12/14/18 1509 12/14/18 1523 12/15/18 0522  WBC 8.6   --  6.7  NEUTROABS 6.2  --   --   HGB 12.8* 12.9* 13.2  HCT 40.6 38.0* 40.4  MCV 89.6  --  89.4  PLT 212  --  187  Liver Function Tests: Recent Labs  Lab 12/15/18 0522  AST 11*  ALT 17  ALKPHOS 65  BILITOT 0.7  PROT 6.0*  ALBUMIN 3.4*   No results for input(s): LIPASE, AMYLASE in the last 168 hours. Recent Labs  Lab 12/15/18 0522  AMMONIA 30   Cardiac Enzymes: Recent Labs  Lab 12/14/18 1509 12/14/18 1921 12/14/18 2330 12/15/18 0522  TROPONINI 0.08* 0.09* 0.07* 0.07*   Iron Studies: No results for input(s): IRON, TIBC, TRANSFERRIN, FERRITIN in the last 72 hours. PT/INR: @LABRCNTIP (inr:5)  Xrays/Other Studies: ) Results for orders placed or performed during the hospital encounter of 12/14/18 (from the past 48 hour(s))  Glucose, capillary     Status: Abnormal   Collection Time: 12/19/18 12:08 PM  Result Value Ref Range   Glucose-Capillary 111 (H) 70 - 99 mg/dL   Comment 1 Notify RN    Comment 2 Document in Chart   Glucose, capillary     Status: None   Collection Time: 12/19/18  4:51 PM  Result Value Ref Range   Glucose-Capillary 89 70 - 99 mg/dL   Comment 1 Notify RN    Comment 2 Document in Chart   Glucose, capillary     Status: Abnormal   Collection Time: 12/19/18  8:54 PM  Result Value Ref Range   Glucose-Capillary 137 (H) 70 - 99 mg/dL  Glucose, capillary     Status: Abnormal   Collection Time: 12/20/18  5:58 AM  Result Value Ref Range   Glucose-Capillary 116 (H) 70 - 99 mg/dL  Basic metabolic panel     Status: Abnormal   Collection Time: 12/20/18 10:06 AM  Result Value Ref Range   Sodium 132 (L) 135 - 145 mmol/L   Potassium 5.6 (H) 3.5 - 5.1 mmol/L   Chloride 101 98 - 111 mmol/L   CO2 22 22 - 32 mmol/L   Glucose, Bld 92 70 - 99 mg/dL   BUN 59 (H) 8 - 23 mg/dL   Creatinine, Ser 3.76 (H) 0.61 - 1.24 mg/dL   Calcium 8.6 (L) 8.9 - 10.3 mg/dL   GFR calc non Af Amer 15 (L) >60 mL/min   GFR calc Af Amer 17 (L) >60 mL/min   Anion gap 9 5 - 15     Comment: Performed at [facility], [address], [place], [place] [address]  Glucose, capillary     Status: Abnormal   Collection Time: 12/20/18  4:46 PM  Result Value Ref Range   Glucose-Capillary 232 (H) 70 - 99 mg/dL  Glucose, capillary     Status: Abnormal   Collection Time: 12/20/18  9:29 PM  Result Value Ref Range   Glucose-Capillary 293 (H) 70 - 99 mg/dL   Comment 1 Notify RN    Comment 2 Document in Chart   Basic metabolic panel     Status: Abnormal   Collection Time: 12/21/18  4:16 AM  Result Value Ref Range   Sodium 129 (L) 135 - 145 mmol/L   Potassium 6.7 (HH) 3.5 - 5.1 mmol/L    Comment: CRITICAL RESULT CALLED TO, READ BACK BY AND VERIFIED WITH: WALL,K RN 12/21/2018 0557 JORDANS    Chloride 100 98 - 111 mmol/L   CO2 18 (L) 22 - 32 mmol/L   Glucose, Bld 256 (H) 70 - 99 mg/dL   BUN 54 (H) 8 - 23 mg/dL   Creatinine, Ser 3.21 (H) 0.61 - 1.24 mg/dL   Calcium 8.3 (L) 8.9 - 10.3 mg/dL   GFR calc  non Af Amer 18 (L) >60 mL/min   GFR calc Af Amer 21 (L) >60 mL/min   Anion gap 11 5 - 15    Comment: Performed at [facility] [address], [place],  [address]  Glucose, capillary     Status: Abnormal   Collection Time: 12/21/18  6:23 AM  Result Value Ref Range   Glucose-Capillary 228 (H) 70 - 99 mg/dL   Comment 1 Notify RN    Comment 2 Document in Chart    Dg Chest 2 View  Result Date: 12/20/2018 CLINICAL DATA:  Wheezing EXAM: CHEST - 2 VIEW COMPARISON:  12/14/2018 FINDINGS: Cardiac shadow is stable. Mild aortic calcifications are seen. Lungs are well aerated bilaterally. Minimal left basilar atelectasis is seen. Old rib fractures and compression deformities are noted. No new focal abnormality is seen. IMPRESSION: Mild left basilar atelectasis.  No other focal abnormality is noted. Electronically Signed   By: Inez Catalina M.D.   On: 12/20/2018 14:42    PMH:   Past Medical History:  Diagnosis Date  . Abnormality of gait 01/14/2016  . Alcohol abuse  2009   dependency as exhibited by ETOH withdrawal seizure.   . Alcohol withdrawal seizure ([place]) 2013  . Anemia 2011   Normocytic.   . Cataract   . Chronic renal insufficiency, stage III (moderate) ([place]) 02/26/2014  . Depression with anxiety   . Hearing impairment   . HTN (hypertension), benign 02/26/2014  . Hyperlipemia   . Seizures ([place])   . Type II or unspecified type diabetes mellitus with neurological manifestations, not stated as uncontrolled(250.60) 02/26/2014    PSH:   Past Surgical History:  Procedure Laterality Date  . NO PAST SURGERIES      Allergies: No Known Allergies  Medications:   Prior to Admission medications   Medication Sig Start Date End Date Taking? Authorizing Provider  acetaminophen (TYLENOL) 650 MG CR tablet Take 650 mg by mouth every 8 (eight) hours.   Yes [provider]  busPIRone (BUSPAR) 5 MG tablet Take 2.5 mg by mouth 2 (two) times daily.    Yes [provider]  divalproex (DEPAKOTE) 500 MG DR tablet Take 500 mg by mouth 3 (three) times daily.   Yes [provider]  gabapentin (NEURONTIN) 300 MG capsule Take 300 mg by mouth at bedtime.   Yes [provider]  insulin aspart (NOVOLOG) 100 UNIT/ML injection Inject 10 Units into the skin 3 (three) times daily before meals. With meals if Blood Sugar is >180. Call MD if CBG >400.  Takes at [place], 1130, 1630   Yes [provider]  Insulin Glargine (TOUJEO MAX SOLOSTAR) 300 UNIT/ML SOPN Inject 0-165 Units into the skin See admin instructions. Per Sliding Scale once daily 0-180 give 0 units 181-450 give 165 units   Yes [provider]  liraglutide (VICTOZA) 18 MG/3ML SOPN Inject 1.2 mg into the skin daily.   Yes [provider]  lisinopril (PRINIVIL,ZESTRIL) 10 MG tablet Take 10 mg by mouth daily.    Yes [provider]  QUEtiapine (SEROQUEL) 100 MG tablet Take 100 mg by mouth 2 (two) times daily.    Yes [provider]  sertraline  (ZOLOFT) 100 MG tablet Take 100 mg by mouth at bedtime.   Yes [provider]  desonide (DESOWEN) 0.05 % ointment Apply 1 application topically 2 (two) times daily. For 14 days    [provider]  diphenhydrAMINE (BENADRYL) 25 MG tablet Take 25 mg  by mouth every 8 (eight) hours. For 7 days    [provider]  folic acid (FOLVITE) 1 MG tablet Take 1 tablet (1 mg total) by mouth daily. Patient not taking: Reported on 06/05/2018 05/07/15   Liberty Handy, MD  predniSONE (DELTASONE) 5 MG tablet Take 5 mg by mouth daily with breakfast. For 5 days    [provider]  valproic acid (DEPAKENE) 250 MG/5ML syrup Take 10 mLs (500 mg total) by mouth 3 (three) times daily. Patient not taking: Reported on 12/15/2018 01/02/16   Liberty Handy, MD    Discontinued Meds:   Medications Discontinued During This Encounter  Medication Reason  . ipratropium-albuterol (DUONEB) 0.5-2.5 (3) MG/3ML SOLN Discontinued by provider  . hydrOXYzine (ATARAX) 10 MG/5ML syrup 10 mg   . insulin aspart (novoLOG) injection 0-15 Units   . insulin glargine (LANTUS) injection 50 Units   . ipratropium-albuterol (DUONEB) 0.5-2.5 (3) MG/3ML nebulizer solution 3 mL   . valproic acid (DEPAKENE) solution 500 mg   . divalproex (DEPAKOTE SPRINKLE) capsule 500 mg   . ipratropium-albuterol (DUONEB) 0.5-2.5 (3) MG/3ML nebulizer solution 3 mL   . ipratropium-albuterol (DUONEB) 0.5-2.5 (3) MG/3ML nebulizer solution 3 mL   . lisinopril (PRINIVIL,ZESTRIL) tablet 5 mg   . ipratropium-albuterol (DUONEB) 0.5-2.5 (3) MG/3ML nebulizer solution 3 mL   . ipratropium-albuterol (DUONEB) 0.5-2.5 (3) MG/3ML nebulizer solution 3 mL   . ipratropium-albuterol (DUONEB) 0.5-2.5 (3) MG/3ML nebulizer solution 3 mL     Social History:  reports that he has quit smoking. His smoking use included cigars. His smokeless tobacco use includes chew. He reports that he does not drink alcohol or use drugs.  Family History:   Family History   Family history unknown: Yes    Blood pressure (!) 147/78, pulse 81, temperature 97.8 F (36.6 C), temperature source Oral, resp. rate 18, height 5\' 8"  (1.727 m), weight 98 kg, SpO2 98 %. General appearance: cooperative Head: Normocephalic, without obvious abnormality, atraumatic Eyes: negative Neck: no adenopathy, no carotid bruit, supple, symmetrical, trachea midline and thyroid not enlarged, symmetric, no tenderness/mass/nodules Back: sacrab decub Resp: clear to auscultation bilaterally Cardio: regular rate and rhythm, S1, S2 normal, no murmur, click, rub or gallop GI: soft, non-tender; bowel sounds normal; no masses,  no organomegaly Extremities: extremities normal, atraumatic, no cyanosis or edema Pulses: 2+ and symmetric Skin: poor skin turgor Lymph nodes: Cervical, supraclavicular, and axillary nodes normal. Neurologic: Mental status: Pleasant but confused       Dwana Melena, MD 12/21/2018, 9:36 AM

## 2018-12-21 NOTE — Progress Notes (Signed)
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  As per ED notes patient was hard to arouse at the nursing facility and CPR was performed less than a minute after which patient woke up and told them to stop.  Patient was sent to ED.  Cardiology was consulted.  Cardiology has signed off and does not feel that patient had cardiac arrest.    Subjective   Patient seen and examined, was started on IV fluids yesterday and he pulled out IV this morning again.  Potassium today is 6.7, up from 5.6 yesterday.   Assessment/Plan:    1. Altered mental status/episode of unresponsiveness-resolved, cardiology does not feel that patient had cardiac arrest.  Patient's EEG is consistent with a region of of potential epileptogenecity in the left frontotemporal region.  Patient was on Depakote at skilled facility, will obtain Depakote level.  Started on  Keppra 250 mg p.o. twice daily.  2. Acute kidney injury on CKD stage III-patient has not been getting IV fluids over past 3 days as he pulled out his IV repeatedly.  Was started on IV normal saline yesterday.  Creatinine down to 3.21 this morning.  IV team is again working to insert IV and will continue IV normal saline.  Will consult nephrology for further recommendations and management.   3. Hyperkalemia-potassium was 6.7, up from 5.6 yesterday.?  Cause.  Patient is not on potassium supplementation.  Only new medication started yesterday was prednisone.  Will give 1 dose of calcium gluconate 1 g IV.  Nephrology has been consulted.  EKG this morning showed tall T waves.  Will give 1 dose of Kayexalate 30 g p.o. x1.   Follow BMP in a.m.  4. ? Cardiac arrest-cardiology has signed off and does not feel that patient had cardiac arrest.  He only had mild elevation of troponin, flat trend.   5. History of seizures-patient is on valproic acid, valproic acid level 29.?  Noncompliance with taking valproate, continue valproic acid 500 mg p.o. 3 times daily, started on Keppra 250 mg p.o. twice daily.  6. Dementia-with behavior disturbance, Palliative  care consulted.Started Ativan as needed for agitation.  7. Hypertension-blood pressure is stable, continue lisinopril.  8. Rash/dermatitis-improved, patient had erythematous papular rash on left arm, left side of the face.  Complains of itching.  Continue Cetaphil lotion, started  hydroxyzine 10 mg 3 times daily as needed  9. Diabetes mellitus type 2-blood was is controlled, continue sliding scale insulin with NovoLog.  Lantus was discontinued as patient became hypoglycemic.  Now he has been started on prednisone will start Lantus 5 units subcu at bedtime.  10. Acute bronchitis-patient started on DuoNeb nebulizers every 6 hours.  Breathing is improved.  No more wheezing.  Continue with prednisone taper.     CBG: Recent Labs  Lab 12/19/18 2054 12/20/18 0558 12/20/18 1646 12/20/18 2129 12/21/18 0623  GLUCAP 137* 116* 232* 293* 228*    CBC: Recent Labs  Lab 12/14/18 1509 12/14/18 1523 12/15/18 0522  WBC 8.6  --  6.7  NEUTROABS 6.2  --   --   HGB 12.8* 12.9* 13.2  HCT 40.6 38.0* 40.4  MCV 89.6  --  89.4  PLT 212  --  681    Basic Metabolic Panel: Recent Labs  Lab 12/14/18 1509 12/14/18 1523 12/15/18 0522 12/20/18 1006 12/21/18 0416  NA 133* 131* 137 132* 129*  K 5.1 5.1 4.8 5.6* 6.7*  CL 100  --  102 101 100  CO2 20*  --  23 22 18*  GLUCOSE 331*  --  205* 92 256*  BUN 53*  --  56* 59* 54*  CREATININE 2.44*  --  2.83* 3.76* 3.21*  CALCIUM 9.4  --  9.2 8.6* 8.3*  MG  --   --  1.8  --   --      DVT prophylaxis: Heparin  Code Status:  Full code  Family Communication: Palliative care discussed with family, surrogate decision-maker for patient and she wants a different skilled facility.  Clinical social worker is working on finding a different skilled facility.  Disposition Plan: likely skilled nursing facility in 1 to 2 days.     Consultants:  Cardiology  Procedures:  EEG   Antibiotics:   Anti-infectives (From admission, onward)   None       Objective   Vitals:   12/20/18 1950 12/20/18 2019 12/21/18 0446 12/21/18 0759  BP:  (!) 153/74 (!) 147/78   Pulse:  81 81   Resp:  18 18   Temp:  98.1 F (36.7 C) 97.8 F (36.6 C)   TempSrc:  Oral Oral   SpO2: 100% 98% 96% 98%  Weight:   98 kg   Height:        Intake/Output Summary (Last 24 hours) at 12/21/2018 0908 Last data filed at 12/21/2018 0500 Gross per 24 hour  Intake 1905.78 ml  Output 975 ml  Net 930.78 ml   Filed Weights   12/19/18 0038 12/20/18 0600 12/21/18 0446  Weight: 98 kg 98.9 kg 98 kg     Physical Examination:   General: Appears in no acute distress  Cardiovascular: S1-S2, regular  Respiratory: Clear to auscultation bilaterally, no wheezing auscultated  Abdomen: Abdomen is soft, nontender, no organomegaly  Extremities: No edema of the lower extremities  Neurologic: Alert, oriented x2, no focal deficit noted.     Data Reviewed: I have personally reviewed following labs and imaging studies   Recent Results (from the past 240 hour(s))  MRSA PCR Screening     Status: Abnormal   Collection Time: 12/14/18  6:52 PM  Result Value Ref Range Status   MRSA by PCR POSITIVE (A) NEGATIVE Final    Comment:        The GeneXpert MRSA Assay (FDA approved for NASAL specimens only), is one component of a comprehensive MRSA colonization surveillance program. It is not intended to diagnose MRSA infection nor to guide or monitor treatment for MRSA infections. RESULT CALLED TO, READ BACK BY AND VERIFIED WITH: B RUDD RN [id number]  12/14/18 A BROWNING Performed at [facility], [place] [address] [place], [place] [address]      Liver Function Tests: Recent Labs  Lab 12/15/18 0522  AST 11*  ALT 17  ALKPHOS 65  BILITOT 0.7  PROT 6.0*  ALBUMIN 3.4*   No results for input(s): LIPASE, AMYLASE in the last 168 hours. Recent Labs  Lab 12/15/18 0522  AMMONIA 30    Cardiac Enzymes: Recent Labs  Lab 12/14/18 1509 12/14/18 1921 12/14/18 2330 12/15/18 0522  TROPONINI 0.08* 0.09* 0.07* 0.07*   BNP (last 3 results) No results for input(s): BNP in the last 8760 hours.  ProBNP (last 3 results) No results for input(s): PROBNP in the last 8760 hours.    Studies: Dg Chest 2 View  Result Date: 12/20/2018 CLINICAL DATA:  Wheezing EXAM: CHEST - 2 VIEW COMPARISON:  12/14/2018 FINDINGS: Cardiac shadow is stable. Mild aortic calcifications are seen. Lungs are well aerated bilaterally. Minimal left basilar atelectasis is seen. Old rib fractures and compression deformities are noted. No new focal abnormality is seen. IMPRESSION: Mild left basilar atelectasis.  No other focal abnormality is noted. Electronically Signed   By: Inez Catalina M.D.   On: 12/20/2018 14:42    Scheduled Meds: . busPIRone  5 mg Oral BID  . cetaphil   Topical BID  . divalproex  500 mg Oral Q8H  . feeding supplement (ENSURE ENLIVE)  237 mL Oral BID BM  . folic acid  1 mg Oral Daily  . heparin  5,000 Units Subcutaneous Q8H  . insulin aspart  0-15 Units Subcutaneous TID WC  . insulin aspart  0-5 Units Subcutaneous QHS  . ipratropium-albuterol  3 mL Nebulization BID  . levETIRAcetam  250 mg Oral BID  . multivitamin with minerals  1 tablet Oral Daily  . predniSONE  40 mg Oral Q breakfast  . QUEtiapine  100 mg Oral BID  . sertraline  100 mg Oral QHS  . sodium polystyrene  30 g Oral Once    Admission status: Inpatient: Based on patients clinical presentation and evaluation of above clinical data, I have made determination that patient meets  Inpatient criteria at this time.  Time spent: 25 min  [facility] Pager [phone]. If 7PM-7AM, please contact night-coverage at [email or web address], Office  [phone]  password [email or web address]  12/21/2018, 9:08 AM  LOS: 6 days

## 2018-12-21 NOTE — Progress Notes (Signed)
Pt. K+ 6.7. On call for [facility] paged to make aware. William [name], William [name]

## 2018-12-21 NOTE — Progress Notes (Signed)
Pt. Removed 4th  IV wrapped in kerlix.

## 2018-12-21 NOTE — TOC Progression Note (Addendum)
Transition of Care [facility]) - Progression Note    Patient Details  Name: LYAN MOYANO MRN: [id number] Date of Birth: 1946-03-24  Transition of Care [facility]) CM/SW [place], LCSW Phone Number: 12/21/2018, 8:51 AM  Clinical Narrative:  [name] left this CSW a voicemail yesterday at 5:50 pm asking CSW to call back. CSW tried this morning. No answer. Left a voicemail.  2:01 pm: Still no call back from [name]. Left detailed voicemail about [facility] bed offer as well as their star ratings. CSW explained that if she is not agreeable to this facility he will have to return to [place]. Also explained that she and her daughter will need to coordinate getting to [facility] to complete admissions paperwork before he can admit there. CSW asked her to call back and provide answer on my voicemail.  Expected Discharge Plan: [place] Barriers to Discharge: Continued Medical Work up  Expected Discharge Plan and Services Expected Discharge Plan: [place] Choice: [place] arrangements for the past 2 months: [place]                           Social Determinants of Health (SDOH) Interventions    Readmission Risk Interventions No flowsheet data found.

## 2018-12-22 ENCOUNTER — Inpatient Hospital Stay (HOSPITAL_COMMUNITY): Payer: Medicare Other

## 2018-12-22 LAB — BASIC METABOLIC PANEL
Anion gap: 10 (ref 5–15)
BUN: 53 mg/dL — ABNORMAL HIGH (ref 8–23)
CO2: 21 mmol/L — ABNORMAL LOW (ref 22–32)
Calcium: 8.5 mg/dL — ABNORMAL LOW (ref 8.9–10.3)
Chloride: 101 mmol/L (ref 98–111)
Creatinine, Ser: 2.53 mg/dL — ABNORMAL HIGH (ref 0.61–1.24)
GFR calc Af Amer: 28 mL/min — ABNORMAL LOW (ref >60)
GFR, EST NON AFRICAN AMERICAN: 24 mL/min — AB (ref >60)
Glucose, Bld: 162 mg/dL — ABNORMAL HIGH (ref 70–99)
Potassium: 5.8 mmol/L — ABNORMAL HIGH (ref 3.5–5.1)
SODIUM: 132 mmol/L — AB (ref 135–145)

## 2018-12-22 LAB — SODIUM, URINE, RANDOM: Sodium, Ur: 34 mmol/L

## 2018-12-22 LAB — CREATININE, URINE, RANDOM: Creatinine, Urine: 42.19 mg/dL

## 2018-12-22 LAB — GLUCOSE, CAPILLARY
GLUCOSE-CAPILLARY: 150 mg/dL — AB (ref 70–99)
Glucose-Capillary: 200 mg/dL — ABNORMAL HIGH (ref 70–99)
Glucose-Capillary: 245 mg/dL — ABNORMAL HIGH (ref 70–99)
Glucose-Capillary: 235 mg/dL — ABNORMAL HIGH (ref 70–99)

## 2018-12-22 MED ORDER — HYDRALAZINE HCL 20 MG/ML IJ SOLN
10.0000 mg | Freq: Four times a day (QID) | INTRAMUSCULAR | Status: DC | PRN
  Filled 2018-12-22: qty 1

## 2018-12-22 MED ORDER — INSULIN ASPART 100 UNIT/ML ~~LOC~~ SOLN
3.0000 [IU] | Freq: Three times a day (TID) | SUBCUTANEOUS | Status: DC
  Administered 2018-12-22 – 2018-12-23 (×5): 3 [IU] via SUBCUTANEOUS

## 2018-12-22 MED ORDER — SODIUM CHLORIDE 0.9 % IV SOLN: INTRAVENOUS | Status: AC

## 2018-12-22 MED ORDER — ENSURE ENLIVE PO LIQD
237.0000 mL | Freq: Three times a day (TID) | ORAL | Status: DC
  Administered 2018-12-22 – 2018-12-24 (×7): 237 mL via ORAL

## 2018-12-22 MED ORDER — SODIUM ZIRCONIUM CYCLOSILICATE 10 G PO PACK
10.0000 g | PACK | Freq: Every day | ORAL | Status: DC
  Administered 2018-12-23: 10 g via ORAL
  Filled 2018-12-22: qty 1

## 2018-12-22 MED ORDER — SODIUM ZIRCONIUM CYCLOSILICATE 10 G PO PACK
10.0000 g | PACK | Freq: Once | ORAL | Status: AC
  Administered 2018-12-22: 10 g via ORAL
  Filled 2018-12-22: qty 1

## 2018-12-22 MED ORDER — HEPARIN SODIUM (PORCINE) 5000 UNIT/ML IJ SOLN
5000.0000 [IU] | Freq: Three times a day (TID) | INTRAMUSCULAR | Status: DC
  Administered 2018-12-22 – 2018-12-24 (×6): 5000 [IU] via SUBCUTANEOUS
  Filled 2018-12-22 (×6): qty 1

## 2018-12-22 MED ORDER — SODIUM CHLORIDE 0.9 % IV SOLN
INTRAVENOUS | Status: DC
  Administered 2018-12-22: 12:00:00 via INTRAVENOUS

## 2018-12-22 MED ORDER — BUDESONIDE 0.25 MG/2ML IN SUSP
0.2500 mg | Freq: Two times a day (BID) | RESPIRATORY_TRACT | Status: DC
  Administered 2018-12-22 – 2018-12-24 (×4): 0.25 mg via RESPIRATORY_TRACT
  Filled 2018-12-22 (×4): qty 2

## 2018-12-22 NOTE — Progress Notes (Signed)
Physical Therapy Treatment Patient Details Name: William [name] MRN: [id number] DOB: 11/13/1945 Today's Date: 12/22/2018    History of Present Illness Pt is a 73 y.o. long-term resident at [facility] admitted on 12/14/18 with unresponsiveness requiring brief CPR (<1 min). Head CT negative for acute findings. CXR without acute disease. Hospital course with hyperkalemia and AKI on CKD III. PMH includes DM, seizures, HTN, hearing impairment, cataracts, CKD, alcohol abuse, probable dementia.   PT Comments    Pt performing bed mobility and standing transfers with min guard to minA for balance. Increased cues for participation and safety throughout, pt demonstrates decreased awareness and poor problem solving. Continues to be at high risk for falls. Continue to recommend SNF-level therapies.    Follow Up Recommendations  SNF;Supervision/Assistance - 24 hour     Equipment Recommendations  None recommended by PT    Recommendations for Other Services       Precautions / Restrictions Precautions Precautions: Fall Restrictions Weight Bearing Restrictions: No    Mobility  Bed Mobility Overal bed mobility: Needs Assistance Bed Mobility: Supine to Sit     Supine to sit: Supervision Sit to supine: Supervision   General bed mobility comments: Increased time and effort, reliant on use of bed rail to pull into sitting. Able to scoot hips towards EOB well  Transfers Overall transfer level: Needs assistance Equipment used: 1 person hand held assist Transfers: Sit to/from Stand Sit to Stand: Min assist         General transfer comment: MinA for standing, reliant on UE support; pt only agreeable to stand for soiled bed pad changing, declining further activity  Ambulation/Gait                 Stairs             Wheelchair Mobility    Modified Rankin (Stroke Patients Only)       Balance Overall balance assessment: Needs assistance Sitting-balance support:  Single extremity supported;Feet supported Sitting balance-Leahy Scale: Good     Standing balance support: Bilateral upper extremity supported;During functional activity Standing balance-Leahy Scale: Poor                              Cognition Arousal/Alertness: Awake/alert Behavior During Therapy: Flat affect Overall Cognitive Status: History of cognitive impairments - at baseline                                 General Comments: Per chart, h/o dementia; also very HOH. Pt answering majority of questions appropriately and following simple commands, but easily becomes agitated and verbally aggressive. Able to be somewhat redirected, although this is inconsistent      Exercises      General Comments        Pertinent Vitals/Pain Pain Assessment: No/denies pain    Home Living                      Prior Function            PT Goals (current goals can now be found in the care plan section) Acute Rehab PT Goals Patient Stated Goal: "I want some coffee" PT Goal Formulation: With patient Time For Goal Achievement: 12/29/18 Potential to Achieve Goals: Fair Progress towards PT goals: Not progressing toward goals - comment(Limited motivation to participate/fatigue)    Frequency    Min  2X/week      PT Plan Current plan remains appropriate    Co-evaluation              AM-PAC PT "6 Clicks" Mobility   Outcome Measure  Help needed turning from your back to your side while in a flat bed without using bedrails?: None Help needed moving from lying on your back to sitting on the side of a flat bed without using bedrails?: A Little Help needed moving to and from a bed to a chair (including a wheelchair)?: A Little Help needed standing up from a chair using your arms (e.g., wheelchair or bedside chair)?: A Little Help needed to walk in hospital room?: A Little Help needed climbing 3-5 steps with a railing? : A Lot 6 Click Score: 18     End of Session   Activity Tolerance: Patient limited by fatigue Patient left: in bed;with call bell/phone within reach Nurse Communication: Mobility status PT Visit Diagnosis: Other abnormalities of gait and mobility (R26.89)     Time: 6967-8938 PT Time Calculation (min) (ACUTE ONLY): 15 min  Charges:  $Therapeutic Activity: 8-22 mins                    Mabeline Caras, PT, DPT Acute Rehabilitation Services  Pager [phone] Office [place] 12/22/2018, 11:26 AM

## 2018-12-22 NOTE — Progress Notes (Addendum)
Inpatient Diabetes Program Recommendations  AACE/ADA: New Consensus Statement on Inpatient Glycemic Control (2015)  Target Ranges:  Prepandial:   less than 140 mg/dL      Peak postprandial:   less than 180 mg/dL (1-2 hours)      Critically ill patients:  140 - 180 mg/dL   Results for CARNELIUS, HAMMITT" (MRN [id number]) as of 12/22/2018 13:03  Ref. Range 12/21/2018 06:23 12/21/2018 11:53 12/21/2018 16:09 12/21/2018 21:05  Glucose-Capillary Latest Ref Range: 70 - 99 mg/dL 228 (H)  5 units NOVOLOG 238 (H)  5 units NOVOLOG 277 (H)  8 units NOVOLOG 209 (H)  2 units NOVOLOG +  15 units LANTUS   Results for NASARIO, CZERNIAK" (MRN [id number]) as of 12/22/2018 13:03  Ref. Range 12/22/2018 06:03 12/22/2018 11:43  Glucose-Capillary Latest Ref Range: 70 - 99 mg/dL 150 (H)  2 units NOVOLOG 200 (H)  6 units NOVOLOG      Home DM Meds: Toujeo 0 units if CBG <180 mg/dl       Toujeo 165 units if CBG 181-450 mg/dl       Novolog 10 units TID with meals if CBG >180 mg/dl       Victoza 1.2 mg Daily   Current Orders: Lantus 15 units QHS      Novolog Moderate Correction Scale/ SSI (0-15 units) TID AC + HS      Novolog 3 units TID with meals     MD- Please consider the following in-hospital insulin adjustments:  Increase Lantus slightly to 18 units QHS (fasting CBG still elevated)   Note Novolog 3 units Meal Coverage started at 12pm today      --Will follow patient during hospitalization--  Wyn Quaker RN, MSN, CDE Diabetes Coordinator Inpatient Glycemic Control Team Team Pager: [phone] (8a-5p)

## 2018-12-22 NOTE — Progress Notes (Addendum)
PROGRESS NOTE    William [name]  GLO:[id number] DOB: June 10, 1946 DOA: 12/14/2018 PCP: Patient, No Pcp Per  Brief Narrative:  The patient is an 73 year old male with history of dementia, hypertension, seizure disorder, chronic kidney disease stage III, diabetes mellitus type 2 on insulin, alcohol abuse in the past, depression along with other comorbidities who was brought to the ED from skilled nursing facility for Altered mental status.  As per ED notes patient was hard to arouse at the nursing facility and CPR was performed less than a minute after which patient woke up and told them to stop.  Patient was sent to ED. Cardiology was consulted.  Cardiology has signed off and does not feel that patient had cardiac arrest. His AMS was attributed to Seizure. Further Hospital Course has been complicated by Hyperkalemia and AKI on CKD Stage 3 and Nephrology has been consulted for further evaluation and recommendations.   Assessment & Plan:   Principal Problem:   Altered mental status, unspecified Active Problems:   Depression   Essential hypertension   Chronic renal insufficiency, stage III (moderate) (HCC)   Seizure (HCC)   Diabetes mellitus due to underlying condition, uncontrolled, with renal complication (HCC)   Elevated troponin   Palliative care encounter  Altered mental status/episode of unresponsiveness in the setting of Suspected Seizure -Improved; Cardiology does not feel that patient had cardiac arrest.   -Patient's EEG is consistent with a region of of potential epileptogenecity in the left frontotemporal region.   -Patient was on Depakote at skilled facility, will obtain Depakote level.   -Started on Keppra 250 mg p.o. twice daily and will be continued on Divalproex 500 mg po q8h  -SLP evaluated and recommended Dysphagia 3 Diet   Acute kidney injury on CKD stage III -Patient had not been getting IV fluids over past 3 days as he pulled out his IV repeatedly.   -Baseline Cr is  2.3-2.6 per Nephrology  -Was started on IV normal saline and D/C'd today but resumed by Nephrology at a rate of 100 mL/hr.   -BUN/Creatinine trended down from 59/3.76 -> 54/3.21 -> 53/2.53 -Nephrology consulted for further evaluation and recommendations and recommending resuming IVF with NS at 100 mL/hr x 1 Liter  -Renal U/S showed "No obstructive uropathy. Atrophic right kidney with renal cortical thinning. Left kidney is normal"  Hyperkalemia -Potassium was 6.7 and improved to 5.8. -? Cause but Nephrology believes it may be from Heparin decreased Aldosterone Synthesis, Prerenal Azotemia and Lisionpril.   -Patient is not on potassium supplementation.   -Only new medication started yesterday was prednisone.   -Given 1 dose of calcium gluconate 1 g IV.   -Nephrology has been consulted and recommending Lokelma 10 mg po Daily   -EKG yesterday morning showed tall T waves and was given1 dose of Kayexalate 30 g p.o. x1.  -Continue to Monitor and Trend and Repeat CMP in AM   ? Cardiac Arrest -Cardiology has signed off and does not feel that patient had cardiac arrest.   -He only had mild elevation of troponin, flat trend.   History of Seizures -Patient is on Divalproex valproic acid level 29. -? Noncompliance with taking Divalproex continue Divalproex 500 mg p.o. q8h -Also was started on Keppra 250 mg p.o. twice daily. -Seizure Precautions  Dementia with Behavioral Disturbances -Palliative care consulted.  -C/w Buspirone 5 mg po BID, Sertraline 100 mg po qHS and Quetiapine 100 mg po BID  Hypertension -Blood pressure was slightly elevated and BP was 160/99 -Lisinopril  has now been Discontinued due to Hyperkalemia by Nephrology -Will add IV Hydralazine 10 mg q6hprn for SBP>160 or DBP>100  Rash/Dermatitis -Improved, patient had erythematous papular rash on left arm, left side of the face. Complained of itching.  -Continue Cetaphil lotion and was started on Hydroxyzine 10 mg 3 times  daily as needed  Diabetes Mellitus Type 2 -Lantus was previoisly discontinued as patient became hypoglycemic.   -Now he has been started on prednisone he was started on Lantus 5 units subcu at bedtime which was increased to 15 units qHS and will increase to 18 units sq qHS -C/w Moderate Novolog SSI AC/HS and Novolog 3 units TIDwm -HbA1c was 7.6 this Admission -Continue to Monitor CBG's carefully and adjust Insulin as Necessary -CBGs ranging from 150-293  Acute Bronchitis -Patient was started on DuoNeb nebulizers every 6 hours and reduced to TID.  Breathing is stable but continues to wheeze today -IVF were discontinued given concern of Volume Overload (EF of 40%) but restarted by Nephrology  -Continue with Prednisone Taper and continue 40 mg po Daily with Breakfast -Added Budesonide 0.25 mg Neb BID -CXR this AM showed "Cardiomegaly with mild vascular congestion. Slight worsening aeration." -Currently Afebrile and WBC not repeated today (? If patient refused)  Hyponatremia -Patient's Na+ went from 129 -> 132 -Continue IVF Hydration per Nephrology and continue to Trend -Repeat CMP in AM   Obesity -Estimated body mass index is 32.87 kg/m as calculated from the following:   Height as of this encounter: 5\' 8"  (1.727 m).   Weight as of this encounter: 98.1 kg. -Weight Loss and Dietary Counseling given -Nutritionist Consulted and appreciate further evaluation and recommendations -Nutritionist recommending Ensure Enlive po BID and Magic Cup TID with Meals along with daily MVI  Chronic Systolic and Diastolic CHF -ECHO done showed LVEF of 40-45%\ -Check BNP in AM  -Per Cardiology appears baseline -Continue to Hold Lisinopril -CXR showed "The heart is enlarged. Mild vascular congestion. No consolidation or frank edema. No effusion or pneumothorax. Degenerative change both shoulders. Slight worsening aeration." -Continue to Monitor Volume Status now that patient is getting IVF again per  Nephrology -Strict I's/O's and Daily Weights -C/w Nebs As above -Repeat CXR in AM   DVT prophylaxis: Will add Heparin 5,000 units sq q8h Code Status: DO NOT RESUSCITATE Family Communication: No family present at bedside  Disposition Plan: SNF when medically stable   Consultants:   Cardiology  Nephrology  Neurology  Palliative Care   Procedures:  ECHOCARDIOGRAM 12/15/2018 IMPRESSIONS    1. The left ventricle has mild-moderately reduced systolic function, with an ejection fraction of 40-45%. The cavity size was normal. There is mildly increased left ventricular wall thickness. Left ventricular diastolic Doppler parameters are consistent  with pseudonormalization.  2. The right ventricle has normal systolic function. The cavity was normal. There is no increase in right ventricular wall thickness.  3. Left atrial size was mildly dilated.  4. No evidence of mitral valve stenosis.  5. The aortic valve is tricuspid no stenosis of the aortic valve.  6. The aortic root and ascending aorta are normal in size and structure.  7. The interatrial septum was not assessed.  FINDINGS  Left Ventricle: The left ventricle has mild-moderately reduced systolic function, with an ejection fraction of 40-45%. The cavity size was normal. There is mildly increased left ventricular wall thickness. Left ventricular diastolic Doppler parameters  are consistent with pseudonormalization. Right Ventricle: The right ventricle has normal systolic function. The cavity was normal. There is  no increase in right ventricular wall thickness. Left Atrium: left atrial size was mildly dilated Right Atrium: right atrial size was normal in size. Right atrial pressure is estimated at 3 mmHg. Interatrial Septum: The interatrial septum was not assessed. Pericardium: There is no evidence of pericardial effusion. Mitral Valve: The mitral valve is normal in structure. Mitral valve regurgitation is trivial by color flow  Doppler. No evidence of mitral valve stenosis. Tricuspid Valve: The tricuspid valve is normal in structure. Tricuspid valve regurgitation is trivial by color flow Doppler. Aortic Valve: The aortic valve is tricuspid Aortic valve regurgitation was not visualized by color flow Doppler. There is no stenosis of the aortic valve. Pulmonic Valve: The pulmonic valve was grossly normal. Pulmonic valve regurgitation is trivial by color flow Doppler. Aorta: The aortic root and ascending aorta are normal in size and structure.   LEFT VENTRICLE PLAX 2D LVIDd:         5.00 cm  Diastology LVIDs:         4.10 cm  LV e' lateral:   5.87 cm/s LV PW:         0.90 cm  LV E/e' lateral: 19.8 LV IVS:        1.20 cm  LV e' medial:    5.11 cm/s LVOT diam:     2.10 cm  LV E/e' medial:  22.7 LV SV:         44 ml LV SV Index:   19.90 LVOT Area:     3.46 cm  RIGHT VENTRICLE RV S prime:     11.90 cm/s TAPSE (M-mode): 2.7 cm  LEFT ATRIUM             Index       RIGHT ATRIUM           Index LA diam:        4.00 cm 1.89 cm/m  RA Pressure: 3 mmHg LA Vol (A2C):   93.3 ml 44.01 ml/m RA Area:     11.40 cm LA Vol (A4C):   57.3 ml 27.03 ml/m RA Volume:   25.70 ml  12.12 ml/m LA Biplane Vol: 77.8 ml 36.70 ml/m  AORTIC VALVE LVOT Vmax:   76.90 cm/s LVOT Vmean:  50.900 cm/s LVOT VTI:    0.134 m   AORTA Ao Root diam: 3.00 cm  MV E velocity: 116.00 cm/s MV A velocity: 55.30 cm/s  SHUNTS MV E/A ratio:  2.10        Systemic VTI:  0.13 m                            Systemic Diam: 2.10 cm    Antimicrobials:  Anti-infectives (From admission, onward)   None     Subjective: Seen and examined at bedside he was somewhat drowsy and easily arousable.  Woke up but did not participate very much in examination and went back to sleep.  Nursing reports that he has been severely agitated and has been receiving Seroquel which is kept him calm otherwise he has been pulling out his IVs.  No nausea or vomiting.  No  overnight events per nursing report.  Objective: Vitals:   12/21/18 1949 12/21/18 2016 12/22/18 0352 12/22/18 0722  BP:  135/64 127/76   Pulse:  95 77   Resp:  18 18   Temp:  98.2 F (36.8 C) (!) 97 F (36.1 C)   TempSrc:  Oral    SpO2: 100%  96% 96% 96%  Weight:   98.1 kg   Height:        Intake/Output Summary (Last 24 hours) at 12/22/2018 0759 Last data filed at 12/22/2018 0300 Gross per 24 hour  Intake 2436.58 ml  Output 1650 ml  Net 786.58 ml   Filed Weights   12/20/18 0600 12/21/18 0446 12/22/18 0352  Weight: 98.9 kg 98 kg 98.1 kg   Examination: Physical Exam:  Constitutional: WN/WD obese Caucasian male in NAD and appears calm but slightly uncomfortable who is sleeping and drowsy when awoken Eyes: Lids and conjunctivae normal, sclerae anicteric  ENMT: External Ears, Nose appear normal. Grossly normal hearing. Mucous membranes are moist. Neck: Appears normal, supple, no cervical masses, normal ROM, no appreciable thyromegaly; no JVD Respiratory: Diminished to auscultation bilaterally with wheezing' No appreciable rales, rhonchi or crackles. Normal respiratory effort and patient is not tachypenic. No accessory muscle use. On Room Air with Saturations in the 90's. Cardiovascular: RRR, no murmurs / rubs / gallops. S1 and S2 auscultated. Trace extremity edema.  Abdomen: Soft, non-tender, Distended 2/2 body habiuts. No masses palpated. No appreciable hepatosplenomegaly. Bowel sounds positive.  GU: Deferred. Musculoskeletal: No clubbing / cyanosis of digits/nails. No joint deformity upper and lower extremities. Skin: No rashes, lesions, ulcers on a limited skin evaluation. No induration; Warm and dry.  Neurologic: CN 2-12 grossly intact with no focal deficits. Romberg sign and cerebellar reflexes not assessed.  Psychiatric: Normal judgment and insight. Drowsy. Depressed appearing mood and flat affect.   Data Reviewed: I have personally reviewed following labs and imaging  studies  CBC: No results for input(s): WBC, NEUTROABS, HGB, HCT, MCV, PLT in the last 168 hours. Basic Metabolic Panel: Recent Labs  Lab 12/20/18 1006 12/21/18 0416 12/22/18 0505  NA 132* 129* 132*  K 5.6* 6.7* 5.8*  CL 101 100 101  CO2 22 18* 21*  GLUCOSE 92 256* 162*  BUN 59* 54* 53*  CREATININE 3.76* 3.21* 2.53*  CALCIUM 8.6* 8.3* 8.5*   GFR: Estimated Creatinine Clearance: 28.7 mL/min (A) (by C-G formula based on SCr of 2.53 mg/dL (H)). Liver Function Tests: No results for input(s): AST, ALT, ALKPHOS, BILITOT, PROT, ALBUMIN in the last 168 hours. No results for input(s): LIPASE, AMYLASE in the last 168 hours. No results for input(s): AMMONIA in the last 168 hours. Coagulation Profile: No results for input(s): INR, PROTIME in the last 168 hours. Cardiac Enzymes: No results for input(s): CKTOTAL, CKMB, CKMBINDEX, TROPONINI in the last 168 hours. BNP (last 3 results) No results for input(s): PROBNP in the last 8760 hours. HbA1C: No results for input(s): HGBA1C in the last 72 hours. CBG: Recent Labs  Lab 12/21/18 0623 12/21/18 1153 12/21/18 1609 12/21/18 2105 12/22/18 0603  GLUCAP 228* 238* 277* 209* 150*   Lipid Profile: No results for input(s): CHOL, HDL, LDLCALC, TRIG, CHOLHDL, LDLDIRECT in the last 72 hours. Thyroid Function Tests: No results for input(s): TSH, T4TOTAL, FREET4, T3FREE, THYROIDAB in the last 72 hours. Anemia Panel: No results for input(s): VITAMINB12, FOLATE, FERRITIN, TIBC, IRON, RETICCTPCT in the last 72 hours. Sepsis Labs: No results for input(s): PROCALCITON, LATICACIDVEN in the last 168 hours.  Recent Results (from the past 240 hour(s))  MRSA PCR Screening     Status: Abnormal   Collection Time: 12/14/18  6:52 PM  Result Value Ref Range Status   MRSA by PCR POSITIVE (A) NEGATIVE Final    Comment:        The GeneXpert MRSA Assay (FDA approved for NASAL  specimens only), is one component of a comprehensive MRSA colonization  surveillance program. It is not intended to diagnose MRSA infection nor to guide or monitor treatment for MRSA infections. RESULT CALLED TO, READ BACK BY AND VERIFIED WITH: B RUDD RN [id number] 12/14/18 A BROWNING Performed at [facility], [place] [address], [place], [place] [address]      RN Pressure Injury Documentation:     Estimated body mass index is 32.87 kg/m as calculated from the following:   Height as of this encounter: 5\' 8"  (1.727 m).   Weight as of this encounter: 98.1 kg.  Malnutrition Type:  Nutrition Problem: Inadequate oral intake Etiology: decreased appetite, lethargy/confusion(AMS)   Malnutrition Characteristics:  Signs/Symptoms: meal completion < 50%, other (comment)(dislike of facility food per pt report)   Nutrition Interventions:  Interventions: Ensure Enlive (each supplement provides 350kcal and 20 grams of protein), Magic cup, MVI   Radiology Studies: Dg Chest 2 View  Result Date: 12/20/2018 CLINICAL DATA:  Wheezing EXAM: CHEST - 2 VIEW COMPARISON:  12/14/2018 FINDINGS: Cardiac shadow is stable. Mild aortic calcifications are seen. Lungs are well aerated bilaterally. Minimal left basilar atelectasis is seen. Old rib fractures and compression deformities are noted. No new focal abnormality is seen. IMPRESSION: Mild left basilar atelectasis.  No other focal abnormality is noted. Electronically Signed   By: Inez Catalina M.D.   On: 12/20/2018 14:42   US Renal  Result Date: 12/21/2018 CLINICAL DATA:  Acute renal failure EXAM: RENAL / URINARY TRACT ULTRASOUND COMPLETE COMPARISON:  None. FINDINGS: Right Kidney: Renal measurements: 8.3 x 3.9 x 3.9 cm = volume: 66 mL. Renal cortical thinning. Echogenicity within normal limits. No mass or hydronephrosis visualized. Left Kidney: Renal measurements: 11.4 x 6 x 5.4 cm = volume: 193 mL. Echogenicity within normal limits. No mass or hydronephrosis visualized. Bladder: Appears normal for degree of bladder distention.  Right bladder diverticulum. IMPRESSION: 1. No obstructive uropathy. 2. Atrophic right kidney with renal cortical thinning. 3. Left kidney is normal. Electronically Signed   By: Kathreen Devoid   On: 12/21/2018 11:24   Scheduled Meds: . busPIRone  5 mg Oral BID  . cetaphil   Topical BID  . divalproex  500 mg Oral Q8H  . feeding supplement (ENSURE ENLIVE)  237 mL Oral BID BM  . folic acid  1 mg Oral Daily  . insulin aspart  0-15 Units Subcutaneous TID WC  . insulin aspart  0-5 Units Subcutaneous QHS  . insulin aspart  3 Units Subcutaneous TID WC  . insulin glargine  15 Units Subcutaneous QHS  . ipratropium-albuterol  3 mL Nebulization BID  . levETIRAcetam  250 mg Oral BID  . multivitamin with minerals  1 tablet Oral Daily  . predniSONE  40 mg Oral Q breakfast  . QUEtiapine  100 mg Oral BID  . sertraline  100 mg Oral QHS  . sodium zirconium cyclosilicate  10 g Oral Once   Continuous Infusions: . sodium chloride 100 mL/hr (12/20/18 1330)  . sodium chloride 100 mL/hr at 12/22/18 0229    LOS: 7 days   Kerney Elbe, DO [facility] PAGER is on Organ  If 7PM-7AM, please contact night-coverage [email or web address] Password [facility] 12/22/2018, 7:59 AM

## 2018-12-22 NOTE — TOC Progression Note (Addendum)
Transition of Care [facility]) - Progression Note    Patient Details  Name: VALE MOUSSEAU MRN: [id number] Date of Birth: 1946/03/15  Transition of Care [facility]) CM/SW [place], LCSW Phone Number: 12/22/2018, 8:05 AM  Clinical Narrative:  [name] called back again yesterday after hours but did not leave a voicemail as requested with facility decision. CSW called again this morning and left a very detailed message explaining the importance of making this decision as soon as possible.  12:00 pm: Tried calling [name] again. No answer.  4:07 pm: Tried calling [name] again. No answer.  Expected Discharge Plan: [place] Barriers to Discharge: Continued Medical Work up  Expected Discharge Plan and Services Expected Discharge Plan: [place] Choice: [place] arrangements for the past 2 months: [place]                           Social Determinants of Health (SDOH) Interventions    Readmission Risk Interventions No flowsheet data found.

## 2018-12-22 NOTE — Progress Notes (Signed)
Nutrition Follow-up  RD working remotely.  DOCUMENTATION CODES:   Obesity unspecified  INTERVENTION:   -Increase Ensure Enlive po to TID, each supplement provides 350 kcal and 20 grams of protein -Continue Magic cup TID with meals, each supplement provides 290 kcal and 9 grams of protein -Continue MVI with minerals daily  NUTRITION DIAGNOSIS:   Inadequate oral intake related to decreased appetite, lethargy/confusion(AMS) as evidenced by meal completion < 50%, other (comment)(dislike of facility food per pt report).  Ongoing  GOAL:   Patient will meet greater than or equal to 90% of their needs  Progressing  MONITOR:   PO intake, Supplement acceptance, Weight trends  REASON FOR ASSESSMENT:   Malnutrition Screening Tool    ASSESSMENT:   73 year old male with AMS presented to ED from nursing facility after patient was reported hard to arouse and CPR was done for less than 1 minute before patient woke up and told them to stop. PMH includes EtOH abuse, CKD III, T2DM, and hearing impairment  Reviewed I/O's: +787 ml x 24 hours and +528 ml since admission  UOP: 1.7 L x 24 hours  Unable to obtain additional history secondary to cognitive and hearing impairments.   Oral intake has remained poor; documented meal intake 20-25% of meals within the past 48 hours. Per MAR, pt is accepting of Ensure supplements. Pt with poor oral intake and would benefit from nutrient dense supplement. One Ensure Enlive supplement provides 350 kcals, 20 grams protein, and 44-45 grams of carbohydrate vs one Glucerna shake supplement, which provides 220 kcals, 10 grams of protein, and 26 grams of carbohydrate. Given pt's hx of DM, RD will continue to monitor PO intake, CBGS, and adjust supplement regimen as appropriate.   Per CSW notes, plan to d/c back to SNF once medically ready.   Medications reviewed and include prednisone (suspect this may be contributing to increased CBGS). Per DM coordinator  notes, recommending increasing lantus to 18 units q HS. 3 unit novolog meal coverage started at noon today.   Lab Results  Component Value Date   HGBA1C 7.6 (H) 12/15/2018   PTA DM medications are Tuojeo (0 units if CBGS <180 mg/dl, 165 units if CBGS: 181-450 mg/dl), 10 units novolog if meal CBGS > 180 mg/dl), 1.2 mg victoza daily).   Labs reviewed: Na: 132, K: 5.8, CBGS: 150-209 (inpatient orders for glycemic control are 0-15 units insulin aspart TID with meals, 0-5 units inulin aspart q HS, 3 units insulin aspart TID with meals, and 15 units insulin glargine q HS).   Diet Order:   Diet Order            DIET DYS 3 Room service appropriate? Yes; Fluid consistency: Thin  Diet effective now              EDUCATION NEEDS:   Not appropriate for education at this time  Skin:  Skin Assessment: Reviewed RN Assessment  Last BM:  12/20/18  Height:   Ht Readings from Last 1 Encounters:  12/19/18 5\' 8"  (1.727 m)    Weight:   Wt Readings from Last 1 Encounters:  12/22/18 98.1 kg    Ideal Body Weight:  70 kg  BMI:  Body mass index is 32.87 kg/m.  Estimated Nutritional Needs:   Kcal:  1610-9604  Protein:  90-105 grams  Fluid:  1.7-1.9 L    Jayma Volpi A. Jimmye Norman, RD, LDN, [place] Registered Dietitian II Certified Diabetes Care and Education Specialist Pager: [phone] After hours Pager: [phone]

## 2018-12-22 NOTE — Progress Notes (Signed)
[facility] Progress Note   73 y.o. male Dementia, HTN, seizures, CKD3, DM, presenting from NH w/ AMS w/ CPR (@[facility] ) for under a minute at which time the pt woke up. Very questionable cardiac arrest and cardiology was not convinced. Pt BL Cr 1.2-1.5 range in early 2019 and then 2.3 in 06/04/2018.  At admission the Cr was 2.44 on 3/17 before incr to 2.83 on 3/18 and then 3.76 3/23 before decr to 3.21 on 12/21/2018 after hydration.  Echo shows EF of 40%.   Assessment/ Plan:   1. Hyperkalemia - May be from the heparin + Lisinopril; heparin decreased aldosterone synthesis + prerenal azotemia. Underlying renal disease from HTN and DM. - Renal ultrasound shows atrophic RK so he is functioning with primarily the LK. - Changed over to SCD and stopping the heparin as likely decr aldosterone synthesis.  - Lokelma 10mg  PO daily  - Hydration NS to increase distal Na/K exchange as well; he's not consuming much. - UPC 0.62 and urine microscopy shows tons of WBC's; will check culture.  2. AKI on CKD IIIB - baseline cr likely in the 2.3-2.6 range as he's progressed since earlier in 2019.  3. AMS 4. HTN 5. DM 6. Dementia 7. H/o Seizures - on depakote and Keppra     Subjective:   Denies f/c/n/v/dyspnea. No events overnight   Objective:   BP 127/76 (BP Location: Right Arm)   Pulse 77   Temp (!) 97 F (36.1 C)   Resp 18   Ht 5\' 8"  (1.727 m)   Wt 98.1 kg   SpO2 96%   BMI 32.87 kg/m   Intake/Output Summary (Last 24 hours) at 12/22/2018 1017 Last data filed at 12/22/2018 0900 Gross per 24 hour  Intake 2676.58 ml  Output 2250 ml  Net 426.58 ml   Weight change: 0.091 kg  Physical Exam: GEN: NAD, Alert, NCAT HEENT: No conjunctival pallor, EOMI NECK: Supple, no thyromegaly LUNGS: CTA B/L but poor effort CV: RRR, No M/R/G ABD: SNDNT +BS  EXT: No lower extremity edema GU: condom cath  Imaging: Dg Chest 2 View  Result Date: 12/20/2018 CLINICAL DATA:  Wheezing EXAM: CHEST  - 2 VIEW COMPARISON:  12/14/2018 FINDINGS: Cardiac shadow is stable. Mild aortic calcifications are seen. Lungs are well aerated bilaterally. Minimal left basilar atelectasis is seen. Old rib fractures and compression deformities are noted. No new focal abnormality is seen. IMPRESSION: Mild left basilar atelectasis.  No other focal abnormality is noted. Electronically Signed   By: Inez Catalina M.D.   On: 12/20/2018 14:42   US Renal  Result Date: 12/21/2018 CLINICAL DATA:  Acute renal failure EXAM: RENAL / URINARY TRACT ULTRASOUND COMPLETE COMPARISON:  None. FINDINGS: Right Kidney: Renal measurements: 8.3 x 3.9 x 3.9 cm = volume: 66 mL. Renal cortical thinning. Echogenicity within normal limits. No mass or hydronephrosis visualized. Left Kidney: Renal measurements: 11.4 x 6 x 5.4 cm = volume: 193 mL. Echogenicity within normal limits. No mass or hydronephrosis visualized. Bladder: Appears normal for degree of bladder distention. Right bladder diverticulum. IMPRESSION: 1. No obstructive uropathy. 2. Atrophic right kidney with renal cortical thinning. 3. Left kidney is normal. Electronically Signed   By: Kathreen Devoid   On: 12/21/2018 11:24   Dg Chest Port 1 View  Result Date: 12/22/2018 CLINICAL DATA:  Shortness of breath and wheezing. EXAM: PORTABLE CHEST 1 VIEW COMPARISON:  12/20/2018 FINDINGS: The heart is enlarged. Mild vascular congestion. No consolidation or frank edema. No effusion or pneumothorax. Degenerative  change both shoulders. Slight worsening aeration. IMPRESSION: Cardiomegaly with mild vascular congestion. Slight worsening aeration. Electronically Signed   By: Staci Righter M.D.   On: 12/22/2018 10:11    Labs: BMET Recent Labs  Lab 12/20/18 1006 12/21/18 0416 12/22/18 0505  NA 132* 129* 132*  K 5.6* 6.7* 5.8*  CL 101 100 101  CO2 22 18* 21*  GLUCOSE 92 256* 162*  BUN 59* 54* 53*  CREATININE 3.76* 3.21* 2.53*  CALCIUM 8.6* 8.3* 8.5*   CBC No results for input(s): WBC,  NEUTROABS, HGB, HCT, MCV, PLT in the last 168 hours.  Medications:    . budesonide (PULMICORT) nebulizer solution  0.25 mg Nebulization BID  . busPIRone  5 mg Oral BID  . cetaphil   Topical BID  . divalproex  500 mg Oral Q8H  . feeding supplement (ENSURE ENLIVE)  237 mL Oral BID BM  . folic acid  1 mg Oral Daily  . insulin aspart  0-15 Units Subcutaneous TID WC  . insulin aspart  0-5 Units Subcutaneous QHS  . insulin aspart  3 Units Subcutaneous TID WC  . insulin glargine  15 Units Subcutaneous QHS  . ipratropium-albuterol  3 mL Nebulization BID  . levETIRAcetam  250 mg Oral BID  . multivitamin with minerals  1 tablet Oral Daily  . predniSONE  40 mg Oral Q breakfast  . QUEtiapine  100 mg Oral BID  . sertraline  100 mg Oral QHS      Otelia Santee, MD 12/22/2018, 10:17 AM

## 2018-12-23 ENCOUNTER — Inpatient Hospital Stay (HOSPITAL_COMMUNITY): Payer: Medicare Other

## 2018-12-23 LAB — CBC WITH DIFFERENTIAL/PLATELET
Abs Immature Granulocytes: 0.06 10*3/uL (ref 0.00–0.07)
Basophils Absolute: 0 10*3/uL (ref 0.0–0.1)
Basophils Relative: 0 %
EOS ABS: 0 10*3/uL (ref 0.0–0.5)
Eosinophils Relative: 0 %
HCT: 35.4 % — ABNORMAL LOW (ref 39.0–52.0)
Hemoglobin: 11.5 g/dL — ABNORMAL LOW (ref 13.0–17.0)
Immature Granulocytes: 1 %
Lymphocytes Relative: 17 %
Lymphs Abs: 0.9 10*3/uL (ref 0.7–4.0)
MCH: 29.6 pg (ref 26.0–34.0)
MCHC: 32.5 g/dL (ref 30.0–36.0)
MCV: 91 fL (ref 80.0–100.0)
Monocytes Absolute: 0.3 10*3/uL (ref 0.1–1.0)
Monocytes Relative: 5 %
Neutro Abs: 3.9 10*3/uL (ref 1.7–7.7)
Neutrophils Relative %: 77 %
Platelets: 154 10*3/uL (ref 150–400)
RBC: 3.89 MIL/uL — AB (ref 4.22–5.81)
RDW: 14.6 % (ref 11.5–15.5)
WBC: 5.2 10*3/uL (ref 4.0–10.5)
nRBC: 0 % (ref 0.0–0.2)

## 2018-12-23 LAB — COMPREHENSIVE METABOLIC PANEL
ALT: 17 U/L (ref 0–44)
AST: 15 U/L (ref 15–41)
Albumin: 3 g/dL — ABNORMAL LOW (ref 3.5–5.0)
Alkaline Phosphatase: 48 U/L (ref 38–126)
Anion gap: 9 (ref 5–15)
BUN: 47 mg/dL — ABNORMAL HIGH (ref 8–23)
CO2: 22 mmol/L (ref 22–32)
Calcium: 8.5 mg/dL — ABNORMAL LOW (ref 8.9–10.3)
Chloride: 105 mmol/L (ref 98–111)
Creatinine, Ser: 2.24 mg/dL — ABNORMAL HIGH (ref 0.61–1.24)
GFR calc Af Amer: 32 mL/min — ABNORMAL LOW (ref >60)
GFR calc non Af Amer: 28 mL/min — ABNORMAL LOW (ref >60)
Glucose, Bld: 192 mg/dL — ABNORMAL HIGH (ref 70–99)
POTASSIUM: 4.8 mmol/L (ref 3.5–5.1)
SODIUM: 136 mmol/L (ref 135–145)
Total Bilirubin: 0.6 mg/dL (ref 0.3–1.2)
Total Protein: 5.4 g/dL — ABNORMAL LOW (ref 6.5–8.1)

## 2018-12-23 LAB — GLUCOSE, CAPILLARY
GLUCOSE-CAPILLARY: 179 mg/dL — AB (ref 70–99)
Glucose-Capillary: 143 mg/dL — ABNORMAL HIGH (ref 70–99)
Glucose-Capillary: 202 mg/dL — ABNORMAL HIGH (ref 70–99)
Glucose-Capillary: 203 mg/dL — ABNORMAL HIGH (ref 70–99)
Glucose-Capillary: 215 mg/dL — ABNORMAL HIGH (ref 70–99)

## 2018-12-23 LAB — URINALYSIS, ROUTINE W REFLEX MICROSCOPIC
Bilirubin Urine: NEGATIVE
Glucose, UA: 150 mg/dL — AB
Ketones, ur: NEGATIVE mg/dL
Nitrite: NEGATIVE
Protein, ur: 30 mg/dL — AB
Specific Gravity, Urine: 1.011 (ref 1.005–1.030)
WBC, UA: >50 WBC/hpf — ABNORMAL HIGH (ref 0–5)
pH: 5 (ref 5.0–8.0)

## 2018-12-23 LAB — MAGNESIUM: Magnesium: 2.1 mg/dL (ref 1.7–2.4)

## 2018-12-23 LAB — BRAIN NATRIURETIC PEPTIDE: B Natriuretic Peptide: 831 pg/mL — ABNORMAL HIGH (ref 0.0–100.0)

## 2018-12-23 LAB — PHOSPHORUS: Phosphorus: 3.5 mg/dL (ref 2.5–4.6)

## 2018-12-23 NOTE — Progress Notes (Signed)
[name] Progress Note    73 y.o.maleDementia, HTN, seizures, CKD3, DM, presenting from NH w/ AMS w/ CPR (@[facility] ) for under a minute at which time the pt woke up. Very questionable cardiac arrest and cardiology was not convinced. Pt BL Cr 1.2-1.5 range in early 2019 and then 2.3 in 06/04/2018. At admission the Cr was 2.44 on 3/17 before incr to 2.83 on 3/18 and then 3.76 3/23 before decr to 3.21 on 12/21/2018 after hydration.  Echo shows EF of 40%.  Assessment/ Plan:   1. Hyperkalemia- May be from the heparin + Lisinopril; heparin decreased aldosterone synthesis + prerenal azotemia. Underlying renal disease from HTN and DM. - Renal ultrasound shows atrophic RK so he is functioning with primarily the LK. - Changed over to SCD and stopping the heparin as likely decr aldosterone synthesis.  - Lokelma 10mg  PO daily (last dose was today); will discontinue. No further doses  - UPC 0.62 and urine microscopy shows tons of WBC's; will check culture. Still not sent. Interstitial nephritis if cx is neg? Can f/u with [facility] upon d/c for CKD and ?interstitial nephritis.  Will sign off at this time; please reconsult as needed.   2. AKI on CKDIIIB - baseline cr likely in the 2.3-2.6 range as he's progressed since earlier in 2019. 3. AMS 4. HTN 5. DM 6. Dementia 7. H/o Seizures - on depakote and Keppra   Subjective:   Feels better. Denies f/c/n/v/dyspnea. Wondering why he's still here   Objective:   BP 100/83   Pulse 100   Temp 97.7 F (36.5 C) (Oral)   Resp 20   Ht 5\' 8"  (1.727 m)   Wt 93 kg   SpO2 97%   BMI 31.17 kg/m   Intake/Output Summary (Last 24 hours) at 12/23/2018 1110 Last data filed at 12/23/2018 9021 Gross per 24 hour  Intake 480 ml  Output 1650 ml  Net -1170 ml   Weight change: -5.08 kg  Physical Exam: GEN: NAD, Alert, pleasant HEENT: No conjunctival pallor, EOMI NECK: Supple, no thyromegaly LUNGS: CTA B/L but poor effort CV: RRR, No M/R/G ABD:  SNDNT +BS  EXT: No lower extremity edema GU: condom cath  Imaging: US Renal  Result Date: 12/21/2018 CLINICAL DATA:  Acute renal failure EXAM: RENAL / URINARY TRACT ULTRASOUND COMPLETE COMPARISON:  None. FINDINGS: Right Kidney: Renal measurements: 8.3 x 3.9 x 3.9 cm = volume: 66 mL. Renal cortical thinning. Echogenicity within normal limits. No mass or hydronephrosis visualized. Left Kidney: Renal measurements: 11.4 x 6 x 5.4 cm = volume: 193 mL. Echogenicity within normal limits. No mass or hydronephrosis visualized. Bladder: Appears normal for degree of bladder distention. Right bladder diverticulum. IMPRESSION: 1. No obstructive uropathy. 2. Atrophic right kidney with renal cortical thinning. 3. Left kidney is normal. Electronically Signed   By: Kathreen Devoid   On: 12/21/2018 11:24   Dg Chest Port 1 View  Result Date: 12/23/2018 CLINICAL DATA:  Shortness of breath. EXAM: PORTABLE CHEST 1 VIEW COMPARISON:  12/22/2018 FINDINGS: Cardiomediastinal silhouette is stable. Central vascular markings remain mildly enlarged and prominent. Slightly prominent interstitial lung markings are unchanged. Negative for a pneumothorax. Extensive degenerative changes in the lower cervical spine and left shoulder. IMPRESSION: Chest radiograph findings have minimally changed. Central vascular structures and interstitial lung markings remain mildly prominent. Findings could represent vascular congestion or mild edema. Electronically Signed   By: Markus Daft M.D.   On: 12/23/2018 08:33   Dg Chest Port 1 View  Result Date:  12/22/2018 CLINICAL DATA:  Shortness of breath and wheezing. EXAM: PORTABLE CHEST 1 VIEW COMPARISON:  12/20/2018 FINDINGS: The heart is enlarged. Mild vascular congestion. No consolidation or frank edema. No effusion or pneumothorax. Degenerative change both shoulders. Slight worsening aeration. IMPRESSION: Cardiomegaly with mild vascular congestion. Slight worsening aeration. Electronically Signed   By:  Staci Righter M.D.   On: 12/22/2018 10:11    Labs: BMET Recent Labs  Lab 12/20/18 1006 12/21/18 0416 12/22/18 0505 12/23/18 0326  NA 132* 129* 132* 136  K 5.6* 6.7* 5.8* 4.8  CL 101 100 101 105  CO2 22 18* 21* 22  GLUCOSE 92 256* 162* 192*  BUN 59* 54* 53* 47*  CREATININE 3.76* 3.21* 2.53* 2.24*  CALCIUM 8.6* 8.3* 8.5* 8.5*  PHOS  --   --   --  3.5   CBC Recent Labs  Lab 12/23/18 0326  WBC 5.2  NEUTROABS 3.9  HGB 11.5*  HCT 35.4*  MCV 91.0  PLT 154    Medications:    . budesonide (PULMICORT) nebulizer solution  0.25 mg Nebulization BID  . busPIRone  5 mg Oral BID  . cetaphil   Topical BID  . divalproex  500 mg Oral Q8H  . feeding supplement (ENSURE ENLIVE)  237 mL Oral TID BM  . folic acid  1 mg Oral Daily  . heparin injection (subcutaneous)  5,000 Units Subcutaneous Q8H  . insulin aspart  0-15 Units Subcutaneous TID WC  . insulin aspart  0-5 Units Subcutaneous QHS  . insulin aspart  3 Units Subcutaneous TID WC  . insulin glargine  15 Units Subcutaneous QHS  . ipratropium-albuterol  3 mL Nebulization BID  . levETIRAcetam  250 mg Oral BID  . multivitamin with minerals  1 tablet Oral Daily  . predniSONE  40 mg Oral Q breakfast  . QUEtiapine  100 mg Oral BID  . sertraline  100 mg Oral QHS  . sodium zirconium cyclosilicate  10 g Oral Daily      Otelia Santee, MD 12/23/2018, 11:10 AM

## 2018-12-23 NOTE — Progress Notes (Signed)
PROGRESS NOTE    William [name]  OZD:[id number] DOB: [birth year] DOA: 12/14/2018 PCP: Patient, No Pcp Per  Brief Narrative:  The patient is an 73 year old male with history of dementia, hypertension, seizure disorder, chronic kidney disease stage III, diabetes mellitus type 2 on insulin, alcohol abuse in the past, depression along with other comorbidities who was brought to the ED from skilled nursing facility for Altered mental status.  As per ED notes patient was hard to arouse at the nursing facility and CPR was performed less than a minute after which patient woke up and told them to stop.  Patient was sent to ED. Cardiology was consulted.  Cardiology has signed off and does not feel that patient had cardiac arrest. His AMS was attributed to Seizure and he has much improved today. Further Hospital Course has been complicated by Hyperkalemia and AKI on CKD Stage 3 and Nephrology has been consulted for further evaluation and recommendations. Cr is back to Baseline and patient's Heparin was DC'd b Nephrology. Nephrology ordering Urine Cx because of WBC on Urine Microscopy (still not sent) and if that is negative feel as if the patient had ?Interstitial Nephritis. Anticipating D/Cing to SNF soon in the next 24-48 hours if medically stable.   Assessment & Plan:   Principal Problem:   Altered mental status, unspecified Active Problems:   Depression   Essential hypertension   Chronic renal insufficiency, stage III (moderate) (HCC)   Seizure (HCC)   Diabetes mellitus due to underlying condition, uncontrolled, with renal complication (HCC)   Elevated troponin   Palliative care encounter  Altered mental status/episode of unresponsiveness in the setting of Suspected Seizure and AKI, improved  -Improved; Cardiology does not feel that patient had cardiac arrest.   -Patient's EEG is consistent with a region of of potential epileptogenecity in the left frontotemporal region.   -Patient was on  Depakote at skilled facility, will obtain Depakote level.   -Started on Keppra 250 mg p.o. twice daily and will be continued on Divalproex 500 mg po q8h  -SLP evaluated and recommended Dysphagia 3 Diet and will continue for now and re-evaluate  Acute kidney injury on CKD stage III, improving  -Patient had not been getting IV fluids over past 3 days as he pulled out his IV repeatedly.   -Baseline Cr is 2.3-2.6 per Nephrology  -Was started on IV normal saline and D/C'd today but resumed by Nephrology at a rate of 100 mL/hr.   -BUN/Creatinine trended down from 59/3.76 -> 54/3.21 -> 53/2.53 -> 47/2.24 -Nephrology consulted for further evaluation and recommendations and recommending resuming IVF with NS at 100 mL/hr x 1 Liter and this has now stopped  -Renal U/S showed "No obstructive uropathy. Atrophic right kidney with renal cortical thinning. Left kidney is normal" -Urine Cx still not sent but patient is now Afebrile and has no Leukocytosis -Per Nephrology Urine Protein Cr ratio was 0.62 and Urinalysis on 3/18 had >50 WBC's and Large Leukocytes but no Urine Cx was sent; Recommending Checking Cx and repeating UA and if Cx is Negative ? Interstitial Nephritis and recommending follow up with [facility] in the outpatient setting as his Cr is now Baseline  -Continue to Monitor and Trend Renal Fxn and repeat CMP in AM   Hyperkalemia, improved  -Potassium was 6.7 and improved to 5.8 yesterday and further improved to 4.8 today . -? Cause but Nephrology believes it may be from Heparin decreased Aldosterone Synthesis, Prerenal Azotemia and Lisinopril and they have discontinued  Heparin and started SCDs -Patient is not on potassium supplementation.   -Only new medication started yesterday was prednisone.   -Given 1 dose of calcium gluconate 1 g IV.   -Nephrology has been consulted and recommending Lokelma 10 mg po Daily  and last dose was today  -Continue to Monitor and Trend and Repeat CMP  in AM   ? Cardiac Arrest -Cardiology has signed off and does not feel that patient had cardiac arrest.   -He only had mild elevation of troponin, flat trend.   History of Seizures -Patient is on Divalproex valproic acid level 29. -? Noncompliance with taking Divalproex continue Divalproex 500 mg p.o. q8h -Also was started on Keppra 250 mg p.o. twice daily. -C/w Seizure Precautions  Dementia with Behavioral Disturbances -Palliative care consulted.  -C/w Buspirone 5 mg po BID, Sertraline 100 mg po qHS and Quetiapine 100 mg po BID  Hypertension -Blood pressure was slightly elevated and BP was 160/99 yesterday and today is 149/91 -Lisinopril has now been Discontinued due to Hyperkalemia by Nephrology -Will add IV Hydralazine 10 mg q6hprn for SBP>160 or DBP>100  Rash/Dermatitis -Improved, patient had erythematous papular rash on left arm, left side of the face. Complained of itching.  -Continue Cetaphil lotion and was started on Hydroxyzine 10 mg 3 times daily as needed  Diabetes Mellitus Type 2 -Lantus was previoisly discontinued as patient became hypoglycemic.   -Now he has been started on prednisone he was started on Lantus 5 units subcu at bedtime which was increased to 15 units qHS and will increase to 18 units sq qHS -C/w Moderate Novolog SSI AC/HS and Novolog 3 units TIDwm -HbA1c was 7.6 this Admission -Continue to Monitor CBG's carefully and adjust Insulin as Necessary -CBGs ranging from 143-245  Acute Bronchitis, improving  -Patient was started on DuoNeb nebulizers every 6 hours and reduced to TID.  Breathing is stable but continues to wheeze today -IVF were discontinued given concern of Volume Overload (EF of 40%) but restarted by Nephrology  -Continue with Prednisone Taper and continue 40 mg po Daily with Breakfast -Added Budesonide 0.25 mg Neb BID -CXR yesterday AM showed "Cardiomegaly with mild vascular congestion. Slight worsening aeration." -CXR this AM is  showing "Cardiomediastinal silhouette is stable. Central vascular markings remain mildly enlarged and prominent. Slightly prominent interstitial lung markings are unchanged. Negative for a pneumothorax. Extensive degenerative changes in the lower cervical spine and left shoulder" -Currently Afebrile and WBC is now 5.2  -Not on any Supplemental O2   Hyponatremia, improved  -Patient's Na+ went from 129 -> 132 -> 136 -Continued IVF Hydration per Nephrology yesterday and now now stopped -Continue to Trend Sodium and Repeat CMP in AM   Obesity -Estimated body mass index is 31.17 kg/m as calculated from the following:   Height as of this encounter: 5\' 8"  (1.727 m).   Weight as of this encounter: 93 kg. -Weight Loss and Dietary Counseling given -Nutritionist Consulted and appreciate further evaluation and recommendations -Nutritionist recommending Ensure Enlive po BID and Magic Cup TID with Meals along with daily MVI  Chronic Systolic and Diastolic CHF -ECHO done showed LVEF of 40-45% -Checked BNP this AM and was 831  -Per Cardiology appears baseline -Continue to Hold Lisinopril given Renal Fxn  -CXR showed "The heart is enlarged. Mild vascular congestion. No consolidation or frank edema. No effusion or pneumothorax. Degenerative change both shoulders. Slight worsening aeration." -Continue to Monitor Volume Status now that patient is getting IVF again per Nephrology -Strict I's/O's and Daily  Weights; Weight is down 13 lbs since admission but ? Accuracy; Patient is -1.402 Liters since admission -C/w Nebs As above -Repeat CXR this AM was essentially unchanged but patient is not dyspneic. Will not continue IVF at this time.   Normocytic Anemia/Anemia of CKD -Patient's hemoglobin/hematocrit went from 13.2/40.4 is now 11.5/35.4 -Check anemia panel in the a.m. -Likely dilutional drop in the setting of IV fluid resuscitation -[place] to monitor for signs and symptoms of bleeding -Repeat CBC in the  a.m.  DVT prophylaxis: Will add Heparin 5,000 units sq q8h Code Status: DO NOT RESUSCITATE Family Communication: No family present at bedside  Disposition Plan: SNF when medically stable in the next 24-48 hours as he is improving back to baseline   Consultants:   Cardiology  Nephrology  Neurology  Palliative Care   Procedures:  ECHOCARDIOGRAM 12/15/2018 IMPRESSIONS    1. The left ventricle has mild-moderately reduced systolic function, with an ejection fraction of 40-45%. The cavity size was normal. There is mildly increased left ventricular wall thickness. Left ventricular diastolic Doppler parameters are consistent  with pseudonormalization.  2. The right ventricle has normal systolic function. The cavity was normal. There is no increase in right ventricular wall thickness.  3. Left atrial size was mildly dilated.  4. No evidence of mitral valve stenosis.  5. The aortic valve is tricuspid no stenosis of the aortic valve.  6. The aortic root and ascending aorta are normal in size and structure.  7. The interatrial septum was not assessed.  FINDINGS  Left Ventricle: The left ventricle has mild-moderately reduced systolic function, with an ejection fraction of 40-45%. The cavity size was normal. There is mildly increased left ventricular wall thickness. Left ventricular diastolic Doppler parameters  are consistent with pseudonormalization. Right Ventricle: The right ventricle has normal systolic function. The cavity was normal. There is no increase in right ventricular wall thickness. Left Atrium: left atrial size was mildly dilated Right Atrium: right atrial size was normal in size. Right atrial pressure is estimated at 3 mmHg. Interatrial Septum: The interatrial septum was not assessed. Pericardium: There is no evidence of pericardial effusion. Mitral Valve: The mitral valve is normal in structure. Mitral valve regurgitation is trivial by color flow Doppler. No evidence of  mitral valve stenosis. Tricuspid Valve: The tricuspid valve is normal in structure. Tricuspid valve regurgitation is trivial by color flow Doppler. Aortic Valve: The aortic valve is tricuspid Aortic valve regurgitation was not visualized by color flow Doppler. There is no stenosis of the aortic valve. Pulmonic Valve: The pulmonic valve was grossly normal. Pulmonic valve regurgitation is trivial by color flow Doppler. Aorta: The aortic root and ascending aorta are normal in size and structure.   LEFT VENTRICLE PLAX 2D LVIDd:         5.00 cm  Diastology LVIDs:         4.10 cm  LV e' lateral:   5.87 cm/s LV PW:         0.90 cm  LV E/e' lateral: 19.8 LV IVS:        1.20 cm  LV e' medial:    5.11 cm/s LVOT diam:     2.10 cm  LV E/e' medial:  22.7 LV SV:         44 ml LV SV Index:   19.90 LVOT Area:     3.46 cm  RIGHT VENTRICLE RV S prime:     11.90 cm/s TAPSE (M-mode): 2.7 cm  LEFT ATRIUM  Index       RIGHT ATRIUM           Index LA diam:        4.00 cm 1.89 cm/m  RA Pressure: 3 mmHg LA Vol (A2C):   93.3 ml 44.01 ml/m RA Area:     11.40 cm LA Vol (A4C):   57.3 ml 27.03 ml/m RA Volume:   25.70 ml  12.12 ml/m LA Biplane Vol: 77.8 ml 36.70 ml/m  AORTIC VALVE LVOT Vmax:   76.90 cm/s LVOT Vmean:  50.900 cm/s LVOT VTI:    0.134 m   AORTA Ao Root diam: 3.00 cm  MV E velocity: 116.00 cm/s MV A velocity: 55.30 cm/s  SHUNTS MV E/A ratio:  2.10        Systemic VTI:  0.13 m                            Systemic Diam: 2.10 cm    Antimicrobials:  Anti-infectives (From admission, onward)   None     Subjective: Seen and examined at bedside patient was much more awake and alert today.  He is oriented to self, place, situation but did not know the month.  Denied any chest pain, lightheadedness or dizziness.  No nausea or vomiting.  Felt well no other complaints or concerns at this time.  Objective: Vitals:   12/22/18 2020 12/23/18 0621 12/23/18 0719 12/23/18 0721  BP:  (!) 154/86 (!) 148/88    Pulse: 92 79    Resp: 18 20    Temp: 98 F (36.7 C) 97.7 F (36.5 C)    TempSrc: Oral Oral    SpO2: 98% 97% 97% 97%  Weight:  93 kg    Height:        Intake/Output Summary (Last 24 hours) at 12/23/2018 2703 Last data filed at 12/23/2018 0820 Gross per 24 hour  Intake 480 ml  Output 2650 ml  Net -2170 ml   Filed Weights   12/21/18 0446 12/22/18 0352 12/23/18 0621  Weight: 98 kg 98.1 kg 93 kg   Examination: Physical Exam:  Constitutional: Well-nourished, well-developed obese Caucasian male in no acute distress appears calm but slightly uncomfortable Eyes: Lids and conjunctive are normal.  Sclera anicteric ENMT: External ears and nose appear normal.  Grossly normal hearing.  Mucous murmurs are moist Neck: Appears supple no JVD Respiratory: Diminished auscultation bilaterally with improvement in his wheezing.  No appreciable rales, rhonchi.  Had some mild crackles.  Has a normal respiratory effort is not tachypneic and he is not wearing any supplemental oxygen via nasal cannula Cardiovascular: Regular rate and rhythm.  No appreciable murmurs, rubs, gallops.  Has mild lower extremity edema noted Abdomen: Soft, nontender, distended secondary body habitus.  Bowel sounds present GU: Deferred Musculoskeletal: No contractures or cyanosis.  No joint deformities in the upper lower extremities Skin: No rashes or lesions on limited skin evaluation.  Neurologic: Cranial Nerves II through XII grossly intact no appreciable focal deficits.  Romberg sign cerebellar reflexes were not assessed Psychiatric: Normal judgment and insight.  He is not as drowsy as yesterday.  He is awake and alert and oriented x2.  Has a pleasant mood and affect today  Data Reviewed: I have personally reviewed following labs and imaging studies  CBC: Recent Labs  Lab 12/23/18 0326  WBC 5.2  NEUTROABS 3.9  HGB 11.5*  HCT 35.4*  MCV 91.0  PLT 500   Basic Metabolic Panel:  Recent Labs    Lab 12/20/18 1006 12/21/18 0416 12/22/18 0505 12/23/18 0326  NA 132* 129* 132* 136  K 5.6* 6.7* 5.8* 4.8  CL 101 100 101 105  CO2 22 18* 21* 22  GLUCOSE 92 256* 162* 192*  BUN 59* 54* 53* 47*  CREATININE 3.76* 3.21* 2.53* 2.24*  CALCIUM 8.6* 8.3* 8.5* 8.5*  MG  --   --   --  2.1  PHOS  --   --   --  3.5   GFR: Estimated Creatinine Clearance: 31.5 mL/min (A) (by C-G formula based on SCr of 2.24 mg/dL (H)). Liver Function Tests: Recent Labs  Lab 12/23/18 0326  AST 15  ALT 17  ALKPHOS 48  BILITOT 0.6  PROT 5.4*  ALBUMIN 3.0*   No results for input(s): LIPASE, AMYLASE in the last 168 hours. No results for input(s): AMMONIA in the last 168 hours. Coagulation Profile: No results for input(s): INR, PROTIME in the last 168 hours. Cardiac Enzymes: No results for input(s): CKTOTAL, CKMB, CKMBINDEX, TROPONINI in the last 168 hours. BNP (last 3 results) No results for input(s): PROBNP in the last 8760 hours. HbA1C: No results for input(s): HGBA1C in the last 72 hours. CBG: Recent Labs  Lab 12/22/18 0603 12/22/18 1143 12/22/18 1643 12/22/18 2124 12/23/18 0636  GLUCAP 150* 200* 245* 235* 143*   Lipid Profile: No results for input(s): CHOL, HDL, LDLCALC, TRIG, CHOLHDL, LDLDIRECT in the last 72 hours. Thyroid Function Tests: No results for input(s): TSH, T4TOTAL, FREET4, T3FREE, THYROIDAB in the last 72 hours. Anemia Panel: No results for input(s): VITAMINB12, FOLATE, FERRITIN, TIBC, IRON, RETICCTPCT in the last 72 hours. Sepsis Labs: No results for input(s): PROCALCITON, LATICACIDVEN in the last 168 hours.  Recent Results (from the past 240 hour(s))  MRSA PCR Screening     Status: Abnormal   Collection Time: 12/14/18  6:52 PM  Result Value Ref Range Status   MRSA by PCR POSITIVE (A) NEGATIVE Final    Comment:        The GeneXpert MRSA Assay (FDA approved for NASAL specimens only), is one component of a comprehensive MRSA colonization surveillance program. It is  not intended to diagnose MRSA infection nor to guide or monitor treatment for MRSA infections. RESULT CALLED TO, READ BACK BY AND VERIFIED WITH: B RUDD RN [id number] 12/14/18 A BROWNING Performed at [facility], [place] [address] [place], [place] [address]     RN Pressure Injury Documentation:    Estimated body mass index is 31.17 kg/m as calculated from the following:   Height as of this encounter: 5\' 8"  (1.727 m).   Weight as of this encounter: 93 kg.  Malnutrition Type:  Nutrition Problem: Inadequate oral intake Etiology: decreased appetite, lethargy/confusion(AMS)   Malnutrition Characteristics:  Signs/Symptoms: meal completion < 50%, other (comment)(dislike of facility food per pt report)   Nutrition Interventions:  Interventions: Ensure Enlive (each supplement provides 350kcal and 20 grams of protein), Magic cup, MVI   Radiology Studies: US Renal  Result Date: 12/21/2018 CLINICAL DATA:  Acute renal failure EXAM: RENAL / URINARY TRACT ULTRASOUND COMPLETE COMPARISON:  None. FINDINGS: Right Kidney: Renal measurements: 8.3 x 3.9 x 3.9 cm = volume: 66 mL. Renal cortical thinning. Echogenicity within normal limits. No mass or hydronephrosis visualized. Left Kidney: Renal measurements: 11.4 x 6 x 5.4 cm = volume: 193 mL. Echogenicity within normal limits. No mass or hydronephrosis visualized. Bladder: Appears normal for degree of bladder distention. Right bladder diverticulum. IMPRESSION: 1. No obstructive  uropathy. 2. Atrophic right kidney with renal cortical thinning. 3. Left kidney is normal. Electronically Signed   By: Kathreen Devoid   On: 12/21/2018 11:24   Dg Chest Port 1 View  Result Date: 12/22/2018 CLINICAL DATA:  Shortness of breath and wheezing. EXAM: PORTABLE CHEST 1 VIEW COMPARISON:  12/20/2018 FINDINGS: The heart is enlarged. Mild vascular congestion. No consolidation or frank edema. No effusion or pneumothorax. Degenerative change both shoulders. Slight worsening  aeration. IMPRESSION: Cardiomegaly with mild vascular congestion. Slight worsening aeration. Electronically Signed   By: Staci Righter M.D.   On: 12/22/2018 10:11   Scheduled Meds:  budesonide (PULMICORT) nebulizer solution  0.25 mg Nebulization BID   busPIRone  5 mg Oral BID   cetaphil   Topical BID   divalproex  500 mg Oral Q8H   feeding supplement (ENSURE ENLIVE)  237 mL Oral TID BM   folic acid  1 mg Oral Daily   heparin injection (subcutaneous)  5,000 Units Subcutaneous Q8H   insulin aspart  0-15 Units Subcutaneous TID WC   insulin aspart  0-5 Units Subcutaneous QHS   insulin aspart  3 Units Subcutaneous TID WC   insulin glargine  15 Units Subcutaneous QHS   ipratropium-albuterol  3 mL Nebulization BID   levETIRAcetam  250 mg Oral BID   multivitamin with minerals  1 tablet Oral Daily   predniSONE  40 mg Oral Q breakfast   QUEtiapine  100 mg Oral BID   sertraline  100 mg Oral QHS   sodium zirconium cyclosilicate  10 g Oral Daily   Continuous Infusions:   LOS: 8 days   Kerney Elbe, DO [facility] PAGER is on AMION  If 7PM-7AM, please contact night-coverage [email or web address] Password [email or web address] 12/23/2018, 8:21 AM

## 2018-12-23 NOTE — TOC Progression Note (Addendum)
Transition of Care [facility]) - Progression Note    Patient Details  Name: SHANN MERRICK MRN: [id number] Date of Birth: Aug 31, 1946  [facility] CM/SW [place], LCSW Phone Number: 12/23/2018, 7:48 AM  Clinical Narrative: Received two voicemails from [name] after midnight asking why his "case worker" wouldn't be able to do his admissions paperwork. She stated she is 73 years old and does not get out much. CSW called back this morning and left a voicemail explaining that only family/decision maker is permitted to complete admissions paperwork. CSW also emphasized that there are two options at this point: either she or her daughter go to [facility] to complete admissions paperwork or he can go back to [facility] and work on a transfer to a different facility from there.  1:56 pm: Per MD note, patient will likely discharge tomorrow or Saturday. Called and left [name] another voicemail to let her know. Also called Jeani Hawking who agreed that best plan at this point will likely be to return to [facility] and then work on transfer to [facility] from there. She asked if them mailing [name] the admissions paperwork would be an option due to COVID-19 and then her mailing it back once complete. [facility] liaison is aware. Left message for [facility] admissions coordinator.  Expected Discharge Plan: [place] Barriers to Discharge: Continued Medical Work up  Expected Discharge Plan and Services Expected Discharge Plan: [place] Choice: [place] arrangements for the past 2 months: [place]                           Social Determinants of Health (SDOH) Interventions    Readmission Risk Interventions No flowsheet data found.

## 2018-12-23 NOTE — Progress Notes (Signed)
PT Cancellation Note  Patient Details Name: William [name] MRN: [id number] DOB: [birth year]   Cancelled Treatment:    Reason Eval/Treat Not Completed: Patient declined, no reason specified. Will follow-up for PT treatment as schedule permits.  Mabeline Caras, PT, DPT Acute Rehabilitation Services  Pager [phone] Office [place] 12/23/2018, 8:16 AM

## 2018-12-24 ENCOUNTER — Inpatient Hospital Stay (HOSPITAL_COMMUNITY): Payer: Medicare Other

## 2018-12-24 DIAGNOSIS — E0829 Diabetes mellitus due to underlying condition with other diabetic kidney complication: Secondary | ICD-10-CM | POA: Diagnosis not present

## 2018-12-24 DIAGNOSIS — N179 Acute kidney failure, unspecified: Secondary | ICD-10-CM | POA: Diagnosis not present

## 2018-12-24 DIAGNOSIS — I129 Hypertensive chronic kidney disease with stage 1 through stage 4 chronic kidney disease, or unspecified chronic kidney disease: Secondary | ICD-10-CM | POA: Diagnosis not present

## 2018-12-24 DIAGNOSIS — E119 Type 2 diabetes mellitus without complications: Secondary | ICD-10-CM | POA: Diagnosis present

## 2018-12-24 DIAGNOSIS — N189 Chronic kidney disease, unspecified: Secondary | ICD-10-CM

## 2018-12-24 DIAGNOSIS — E1122 Type 2 diabetes mellitus with diabetic chronic kidney disease: Secondary | ICD-10-CM | POA: Diagnosis not present

## 2018-12-24 DIAGNOSIS — R7989 Other specified abnormal findings of blood chemistry: Secondary | ICD-10-CM | POA: Diagnosis not present

## 2018-12-24 DIAGNOSIS — M6281 Muscle weakness (generalized): Secondary | ICD-10-CM | POA: Diagnosis present

## 2018-12-24 DIAGNOSIS — E875 Hyperkalemia: Secondary | ICD-10-CM | POA: Diagnosis not present

## 2018-12-24 DIAGNOSIS — F333 Major depressive disorder, recurrent, severe with psychotic symptoms: Secondary | ICD-10-CM | POA: Diagnosis present

## 2018-12-24 DIAGNOSIS — I10 Essential (primary) hypertension: Secondary | ICD-10-CM | POA: Diagnosis present

## 2018-12-24 DIAGNOSIS — R278 Other lack of coordination: Secondary | ICD-10-CM | POA: Diagnosis present

## 2018-12-24 DIAGNOSIS — R569 Unspecified convulsions: Secondary | ICD-10-CM | POA: Diagnosis not present

## 2018-12-24 DIAGNOSIS — Z515 Encounter for palliative care: Secondary | ICD-10-CM | POA: Diagnosis not present

## 2018-12-24 DIAGNOSIS — Z7401 Bed confinement status: Secondary | ICD-10-CM | POA: Diagnosis not present

## 2018-12-24 DIAGNOSIS — M2550 Pain in unspecified joint: Secondary | ICD-10-CM | POA: Diagnosis not present

## 2018-12-24 DIAGNOSIS — N183 Chronic kidney disease, stage 3 (moderate): Secondary | ICD-10-CM | POA: Diagnosis present

## 2018-12-24 DIAGNOSIS — E0865 Diabetes mellitus due to underlying condition with hyperglycemia: Secondary | ICD-10-CM | POA: Diagnosis not present

## 2018-12-24 DIAGNOSIS — R4182 Altered mental status, unspecified: Secondary | ICD-10-CM | POA: Diagnosis present

## 2018-12-24 DIAGNOSIS — R2689 Other abnormalities of gait and mobility: Secondary | ICD-10-CM | POA: Diagnosis present

## 2018-12-24 DIAGNOSIS — R0602 Shortness of breath: Secondary | ICD-10-CM | POA: Diagnosis not present

## 2018-12-24 DIAGNOSIS — F329 Major depressive disorder, single episode, unspecified: Secondary | ICD-10-CM | POA: Diagnosis not present

## 2018-12-24 DIAGNOSIS — R1311 Dysphagia, oral phase: Secondary | ICD-10-CM | POA: Diagnosis present

## 2018-12-24 LAB — CBC WITH DIFFERENTIAL/PLATELET
Abs Immature Granulocytes: 0.16 10*3/uL — ABNORMAL HIGH (ref 0.00–0.07)
Basophils Absolute: 0 10*3/uL (ref 0.0–0.1)
Basophils Relative: 1 %
EOS PCT: 0 %
Eosinophils Absolute: 0 10*3/uL (ref 0.0–0.5)
HCT: 36.2 % — ABNORMAL LOW (ref 39.0–52.0)
Hemoglobin: 11.7 g/dL — ABNORMAL LOW (ref 13.0–17.0)
Immature Granulocytes: 3 %
Lymphocytes Relative: 10 %
Lymphs Abs: 0.6 10*3/uL — ABNORMAL LOW (ref 0.7–4.0)
MCH: 29.5 pg (ref 26.0–34.0)
MCHC: 32.3 g/dL (ref 30.0–36.0)
MCV: 91.4 fL (ref 80.0–100.0)
Monocytes Absolute: 0.3 10*3/uL (ref 0.1–1.0)
Monocytes Relative: 4 %
Neutro Abs: 5 10*3/uL (ref 1.7–7.7)
Neutrophils Relative %: 82 %
PLATELETS: 159 10*3/uL (ref 150–400)
RBC: 3.96 MIL/uL — ABNORMAL LOW (ref 4.22–5.81)
RDW: 14.5 % (ref 11.5–15.5)
WBC: 6.1 10*3/uL (ref 4.0–10.5)
nRBC: 0 % (ref 0.0–0.2)

## 2018-12-24 LAB — COMPREHENSIVE METABOLIC PANEL
ALT: 16 U/L (ref 0–44)
AST: 11 U/L — ABNORMAL LOW (ref 15–41)
Albumin: 3 g/dL — ABNORMAL LOW (ref 3.5–5.0)
Alkaline Phosphatase: 51 U/L (ref 38–126)
Anion gap: 11 (ref 5–15)
BUN: 49 mg/dL — ABNORMAL HIGH (ref 8–23)
CHLORIDE: 101 mmol/L (ref 98–111)
CO2: 23 mmol/L (ref 22–32)
Calcium: 9 mg/dL (ref 8.9–10.3)
Creatinine, Ser: 2.09 mg/dL — ABNORMAL HIGH (ref 0.61–1.24)
GFR calc Af Amer: 35 mL/min — ABNORMAL LOW (ref >60)
GFR, EST NON AFRICAN AMERICAN: 30 mL/min — AB (ref >60)
Glucose, Bld: 238 mg/dL — ABNORMAL HIGH (ref 70–99)
Potassium: 5.7 mmol/L — ABNORMAL HIGH (ref 3.5–5.1)
Sodium: 135 mmol/L (ref 135–145)
Total Bilirubin: 0.5 mg/dL (ref 0.3–1.2)
Total Protein: 5.6 g/dL — ABNORMAL LOW (ref 6.5–8.1)

## 2018-12-24 LAB — BASIC METABOLIC PANEL
Anion gap: 9 (ref 5–15)
BUN: 45 mg/dL — AB (ref 8–23)
CO2: 23 mmol/L (ref 22–32)
Calcium: 9 mg/dL (ref 8.9–10.3)
Chloride: 99 mmol/L (ref 98–111)
Creatinine, Ser: 2.02 mg/dL — ABNORMAL HIGH (ref 0.61–1.24)
GFR calc Af Amer: 36 mL/min — ABNORMAL LOW (ref >60)
GFR calc non Af Amer: 31 mL/min — ABNORMAL LOW (ref >60)
Glucose, Bld: 217 mg/dL — ABNORMAL HIGH (ref 70–99)
Potassium: 5.9 mmol/L — ABNORMAL HIGH (ref 3.5–5.1)
Sodium: 131 mmol/L — ABNORMAL LOW (ref 135–145)

## 2018-12-24 LAB — URINE CULTURE: Culture: 20000 — AB

## 2018-12-24 LAB — IRON AND TIBC
Iron: 74 ug/dL (ref 45–182)
Saturation Ratios: 25 % (ref 17.9–39.5)
TIBC: 294 ug/dL (ref 250–450)
UIBC: 220 ug/dL

## 2018-12-24 LAB — GLUCOSE, CAPILLARY
Glucose-Capillary: 185 mg/dL — ABNORMAL HIGH (ref 70–99)
Glucose-Capillary: 182 mg/dL — ABNORMAL HIGH (ref 70–99)
Glucose-Capillary: 188 mg/dL — ABNORMAL HIGH (ref 70–99)

## 2018-12-24 LAB — FERRITIN: Ferritin: 76 ng/mL (ref 24–336)

## 2018-12-24 LAB — PHOSPHORUS: Phosphorus: 2.7 mg/dL (ref 2.5–4.6)

## 2018-12-24 LAB — FOLATE: Folate: 8.8 ng/mL (ref >5.9)

## 2018-12-24 LAB — RETICULOCYTES
Immature Retic Fract: 2.7 % (ref 2.3–15.9)
RBC.: 3.96 MIL/uL — ABNORMAL LOW (ref 4.22–5.81)
Retic Count, Absolute: 45.1 10*3/uL (ref 19.0–186.0)
Retic Ct Pct: 1.1 % (ref 0.4–3.1)

## 2018-12-24 LAB — MAGNESIUM: MAGNESIUM: 2.1 mg/dL (ref 1.7–2.4)

## 2018-12-24 LAB — VITAMIN B12: Vitamin B-12: 889 pg/mL (ref 180–914)

## 2018-12-24 MED ORDER — INSULIN ASPART 100 UNIT/ML ~~LOC~~ SOLN
3.0000 [IU] | Freq: Three times a day (TID) | SUBCUTANEOUS | Status: DC
  Administered 2018-12-24 (×3): 3 [IU] via SUBCUTANEOUS

## 2018-12-24 MED ORDER — INSULIN GLARGINE 100 UNIT/ML ~~LOC~~ SOLN
18.0000 [IU] | Freq: Every day | SUBCUTANEOUS | Status: DC
  Filled 2018-12-24: qty 0.18

## 2018-12-24 MED ORDER — IPRATROPIUM-ALBUTEROL 0.5-2.5 (3) MG/3ML IN SOLN: 3.0000 mL | Freq: Four times a day (QID) | RESPIRATORY_TRACT | 0 refills | Status: DC | PRN

## 2018-12-24 MED ORDER — SODIUM ZIRCONIUM CYCLOSILICATE 10 G PO PACK
10.0000 g | PACK | Freq: Once | ORAL | Status: AC
  Administered 2018-12-24: 10 g via ORAL
  Filled 2018-12-24: qty 1

## 2018-12-24 MED ORDER — LEVETIRACETAM 250 MG PO TABS: 250.0000 mg | ORAL_TABLET | Freq: Two times a day (BID) | ORAL | 0 refills | Status: DC

## 2018-12-24 MED ORDER — BUDESONIDE 0.25 MG/2ML IN SUSP: 0.2500 mg | Freq: Two times a day (BID) | RESPIRATORY_TRACT | 12 refills | Status: DC

## 2018-12-24 MED ORDER — SODIUM ZIRCONIUM CYCLOSILICATE 10 G PO PACK: 10.0000 g | PACK | Freq: Every day | ORAL | 0 refills | Status: AC

## 2018-12-24 MED ORDER — FUROSEMIDE 10 MG/ML IJ SOLN
40.0000 mg | Freq: Once | INTRAMUSCULAR | Status: AC
  Administered 2018-12-24: 40 mg via INTRAVENOUS
  Filled 2018-12-24: qty 4

## 2018-12-24 MED ORDER — ONDANSETRON HCL 4 MG PO TABS: 4.0000 mg | ORAL_TABLET | Freq: Four times a day (QID) | ORAL | 0 refills | Status: DC | PRN

## 2018-12-24 MED ORDER — CETAPHIL MOISTURIZING EX LOTN: TOPICAL_LOTION | Freq: Two times a day (BID) | CUTANEOUS | 0 refills | Status: DC

## 2018-12-24 MED ORDER — PREDNISONE 10 MG PO TABS: ORAL_TABLET | ORAL | 0 refills | Status: DC

## 2018-12-24 MED ORDER — FUROSEMIDE 10 MG/ML IJ SOLN
INTRAMUSCULAR | Status: AC
  Administered 2018-12-24: 16:00:00
  Filled 2018-12-24: qty 4

## 2018-12-24 MED ORDER — ADULT MULTIVITAMIN W/MINERALS CH: 1.0000 | ORAL_TABLET | Freq: Every day | ORAL | 0 refills | Status: DC

## 2018-12-24 MED ORDER — HYDROXYZINE HCL 10 MG PO TABS: 10.0000 mg | ORAL_TABLET | Freq: Three times a day (TID) | ORAL | 0 refills | Status: DC | PRN

## 2018-12-24 MED ORDER — SENNOSIDES-DOCUSATE SODIUM 8.6-50 MG PO TABS: 1.0000 | ORAL_TABLET | Freq: Every evening | ORAL | 0 refills | Status: DC | PRN

## 2018-12-24 MED ORDER — PREDNISONE 20 MG PO TABS
40.0000 mg | ORAL_TABLET | Freq: Every day | ORAL | Status: DC
  Administered 2018-12-24: 40 mg via ORAL
  Filled 2018-12-24 (×2): qty 2

## 2018-12-24 NOTE — TOC Progression Note (Signed)
Transition of Care [facility]) - Progression Note    Patient Details  Name: William [name] MRN: [id number] Date of Birth: 09-Aug-1946  Transition of Care [facility]) CM/SW [place], LCSW Phone Number: 12/24/2018, 8:04 AM  Clinical Narrative:  Received voicemail from [name] at 2:29 am confirming that patient can return to [facility] at discharge.  Expected Discharge Plan: [place] Barriers to Discharge: Continued Medical Work up  Expected Discharge Plan and Services Expected Discharge Plan: [place] Choice: [place] arrangements for the past 2 months: [place]                           Social Determinants of Health (SDOH) Interventions    Readmission Risk Interventions No flowsheet data found.

## 2018-12-24 NOTE — Discharge Summary (Addendum)
Physician Discharge Summary  William [name]:[id number] DOB: 1946-05-18 DOA: 12/14/2018  PCP: Patient, No Pcp Per  Admit date: 12/14/2018 Discharge date: 12/24/2018  Admitted From: Nursing Home Disposition: SNF  Recommendations for Outpatient Follow-up:  1. Follow up with PCP in 1-2 weeks; Repeat Bloodwork within 1 week as below specifically to evaluate K+ 2. Follow up with Nephrology in the outpatient setting within 1 week 3. Follow up with Neurology at D/C 4. Follow up with Palliative Care at D/C 5. Follow up with Cardiology at D/C 6. Please obtain CMP/CBC, Mag, Phos in one week 7. Please follow up on the following pending results:  Home Health: No Equipment/Devices: None recommended by PT  Discharge Condition: Stable  CODE STATUS: DO NOT RESUSCITATE  Diet recommendation: DYSPHAGIA 3 Heart Healthy Carb Modified Diet  Brief/Interim Summary: The patient is an 73 year old male with history of dementia, hypertension, seizure disorder, chronic kidney disease stage III, diabetes mellitus type 2 on insulin, alcohol abuse in the past, depression along with other comorbidities who was brought to the ED from skilled nursing facility for Altered mental status. As per ED notes patient was hard to arouse at the nursing facility and CPR was performed less than a minute after which patient woke up and told them to stop. Patient was sent to ED. Cardiology was consulted. Cardiology has signed off and does not feel that patient had cardiac arrest. His AMS was attributed to Seizure and he has much improved today. Further Hospital Course has been complicated by Hyperkalemia and AKI on CKD Stage 3 and Nephrology has been consulted for further evaluation and recommendations.   Cr is back to Baseline and slightly better and patient's Heparin was DC'd b Nephrology. Nephrology ordering Urine Cx because of WBC on Urine Microscopy and this showed 20,000 CFU of Multiple Speices; They feel as if the patient  had ?Interstitial Nephritis or a Type IV RTA.  Position was complicated by acute kidney injury on chronic kidney disease which is improved.  Also complicated by hyperkalemia which is improved yesterday but will back up today and nephrology asked to weigh in again recommending [place] for a week.  He denies shortness of breath the chest x-ray appeared to have some volume overload so was given a dose of IV Lasix prior to discharge.  He was deemed medically stable to be discharged back to a skilled nursing facility need to follow-up with PCP, Cardiology, Nephrology, Neurology, as well as Palliative care medicine in outpatient setting.  Discharge Diagnoses:  Principal Problem:   Altered mental status, unspecified Active Problems:   Depression   Essential hypertension   Chronic renal insufficiency, stage III (moderate) (HCC)   Seizure (HCC)   Diabetes mellitus due to underlying condition, uncontrolled, with renal complication (HCC)   Elevated troponin   Palliative care encounter  Altered mental status/episode of unresponsiveness in the setting of Suspected Seizure and AKI, improved significantly  -Improved; Cardiology does not feel that patient had cardiac arrest.  -Patient's EEG is consistent with a region of of potential epileptogenecity in the left frontotemporal region.  -Patient was on Depakote at skilled facility, will obtain Depakote level.  -Started on Keppra 250 mg p.o. twice daily and will be continued on Divalproex 500 mg po q8h  -SLP evaluated and recommended Dysphagia 3 Diet and will continue for now and re-evaluate at SNF  Acute kidney injury on CKD stage III, improved -Patient had not been getting IV fluids over past 3 days as he pulled out his IV  repeatedly.  -Baseline Cr is 2.3-2.6 per Nephrology  -Was started on IV normal saline and D/C'd today but resumed by Nephrology at a rate of 100 mL/hr.  -BUN/Creatinine trended down from 59/3.76 -> 54/3.21 -> 53/2.53 -> 47/2.24 ->  49/2.09 -Nephrology consulted for further evaluation and recommendations and recommending resuming IVF with NS at 100 mL/hr x 1 Liter and this has now stopped; Given Dose of IV Lasix today  -Renal U/S showed "No obstructive uropathy. Atrophic right kidney with renal cortical thinning. Left kidney is normal" -Urine Cx still not sent but patient is now Afebrile and has no Leukocytosis -Per Nephrology Urine Protein Cr ratio was 0.62 and Urinalysis on 3/18 had >50 WBC's and Large Leukocytes but no Urine Cx was sent; Recommending Checking Cx  and repeating UA and if Cx is Negative ? Interstitial Nephritis and recommending follow up with [place] in the outpatient setting as his Cr is now Baseline; Urine Cx showed 20,000 CFU of Multiple Species and patient is not symptomatic so will not treat Urine -GI believes that he may have some underlying type IV renal tubular acidosis in setting of longstanding diabetes and recommends tighter control of his diabetes as the hyperglycemia can cause solvent drag for the potassium of the cells.  -Manuela Schwartz is also recommending stopping Ensure -Recommendation is to follow-up with [facility] at discharge for his CKD and ?interstitial nephritis -Continue to Monitor and Trend Renal Fxn and repeat CMP as an outpatient AM   Hyperkalemia, improved  -Potassium was 6.7 and improved to 5.8 yesterday and further improved to 4.8 today but slightly worsened to 5.7 this AM; WILL order Stat BMP prior to D/C to evaluate and was 5.9 -? Cause but Nephrology believes it may be from Heparin decreased Aldosterone Synthesis, Prerenal Azotemia and Lisinopril and they have discontinued Heparin and started SCDs; Heparin D/C'd  -Patient is not on potassium supplementation but was getting Feeding Supplements which have now been Discontinued and will Discontinue at D/C.  -Only new medication started yesterday was prednisone.  -Given 1 dose of calcium  gluconate 1 g IV.  -Nephrology has been consulted and recommending Lokelma 10 mg po Daily and last dose was yesterday and was stopped yesterday but resumed today and Nephrology recommending continuing it daily x 1 week and then repeating K+ -Continue to Monitor and Trend and Repeat CMP at SNF   ? Cardiac Arrest -Cardiology has signed off and does not feel that patient had cardiac arrest.  -He only had mild elevation of troponin, flat trend.   History of Seizures -Patient is on Divalproex valproic acid level 29. -? Noncompliance with taking Divalproex continue Divalproex 500 mg p.o. q8h -Also was started on Keppra 250 mg p.o. twice daily. -C/w Seizure Precautions -Follow up with Neurology as an outpatient   Dementia with Behavioral Disturbances -Palliativecare consulted.  -C/w Buspirone 5 mg po BID, Sertraline 100 mg po qHS and Quetiapine 100 mg po BID  Hypertension -Blood pressure was slightly elevated and now is 137/84 -Lisinopril has now been Discontinued due to Hyperkalemia by Nephrology -Will add IV Hydralazine 10 mg q6hprn for SBP>160 or DBP>100  Rash/Dermatitis -Improved, patient had erythematous papular rash on left arm, left side of the face. Complained of itching.  -Continue Cetaphil lotion and was started on Hydroxyzine 10 mg 3 times daily as needed -Improved   Diabetes Mellitus Type 2 -Lantus was previoisly discontinued as patient became hypoglycemic.  -Now he has been started on prednisone he was started  on Lantus 5 units subcu at bedtime which was increased to 15 units qHS and will increase to 18 units sq qHS today  -C/w Moderate Novolog SSI AC/HS and Novolog 3 units TIDwm -Resume Home Insulin Regimen at D/C to SNF  -HbA1c was 7.6 this Admission -Continue to Monitor CBG's carefully and adjust Insulin as Necessary -CBGs ranging from 179-215  Acute Bronchitis, improved -Patient was started on DuoNeb nebulizers every 6 hours and reduced to TID.Breathing  is stable but continues to wheeze today -IVF were discontinued given concern of Volume Overload (EF of 40%) but restarted by Nephrology  -Continue with Prednisone Taper and continue 40 mg po Daily with Breakfast and Taper over the Next days as written -Added Budesonide 0.25 mg Neb BID -CXR yesterday AM showed "Cardiomegaly with mild vascular congestion. Slight worsening aeration." -CXR this AM is showing "Cardiomediastinal silhouette is stable. Central vascular markings remain mildly enlarged and prominent. Slightly prominent interstitial lung markings are unchanged. Negative for a pneumothorax. Extensive degenerative changes in the lower cervical spine and left shoulder" -Currently Afebrile and WBC is now 6.1 -Not on any Supplemental O2   Hyponatremia, improved  -Patient's Na+ went from 129 -> 132 -> 136 -> 135 -IVF Hydration stopped  -Continue to Trend Sodium and Repeat CMP in the outpatient setting   Obesity -Estimated body mass index is 31.47 kg/m as calculated from the following:   Height as of this encounter: 5\' 8"  (1.727 m).   Weight as of this encounter: 93.9 kg. -Nutritionist recommending Ensure Enlive po BID and Magic Cup TID with Meals along with daily MVI  Acute on Chronic Systolic and Diastolic CHF -ECHO done showed LVEF of 40-45% -Checked BNP was 831  -Per Cardiology appears baseline but had mild volume overload toady -Continue to Hold Lisinopril given Renal Fxn and will continue to Hold in the outpatient setting due to Hyperkalemia -Strict I's/O's and Daily Weights; Weight is down 11 lbs since admission but ? Accuracy; Patient is -1.992 Liters since admission -Continued Nebs As above -Repeat CXR this AM showed "Progression of bibasilar airspace disease. Progression of small bilateral effusions. Diffuse vascular congestion." -Not Dyspneic on Examination but PT not able to get SpO2 reading -Will give IV Lasix 40 mg x1 prior to D/C -Will not continue IVF at this time.   -Strict I's/O's and Daily Weights  -Continue to Monitor for S/Sx of Volume Overload  -Follow up with Cardiology as an outpatent   Normocytic Anemia/Anemia of CKD -Patient's hemoglobin/hematocrit went from 13.2/40.4 is now 11.7/36.2 -Checked Anemia Panel this AM and showed iron level of 74, U IBC of 220, TIBC of 294, saturation ratios of 25%, ferritin level of 76, folate level of 8.8, and vitamin B12 level of 889. -Likely dilutional drop in the setting of IV fluid resuscitation -Tinea to monitor for signs and symptoms of bleeding -Repeat CBC in the outpatient setting  Discharge Instructions Discharge Instructions    (HEART FAILURE PATIENTS) Call MD:  Anytime you have any of the following symptoms: 1) 3 pound weight gain in 24 hours or 5 pounds in 1 week 2) shortness of breath, with or without a dry hacking cough 3) swelling in the hands, feet or stomach 4) if you have to sleep on extra pillows at night in order to breathe.   Complete by:  As directed    Call MD for:  difficulty breathing, headache or visual disturbances   Complete by:  As directed    Call MD for:  extreme fatigue  Complete by:  As directed    Call MD for:  hives   Complete by:  As directed    Call MD for:  persistant dizziness or light-headedness   Complete by:  As directed    Call MD for:  persistant nausea and vomiting   Complete by:  As directed    Call MD for:  redness, tenderness, or signs of infection (pain, swelling, redness, odor or green/yellow discharge around incision site)   Complete by:  As directed    Call MD for:  severe uncontrolled pain   Complete by:  As directed    Call MD for:  temperature >100.4   Complete by:  As directed    Diet - low sodium heart healthy   Complete by:  As directed    Diet Carb Modified   Complete by:  As directed    Discharge instructions   Complete by:  As directed    You were cared for by a hospitalist during your hospital stay. If you have any questions about your  discharge medications or the care you received while you were in the hospital after you are discharged, you can call the unit and ask to speak with the hospitalist on call if the hospitalist that took care of you is not available. Once you are discharged, your primary care physician will handle any further medical issues. Please note that NO REFILLS for any discharge medications will be authorized once you are discharged, as it is imperative that you return to your primary care physician (or establish a relationship with a primary care physician if you do not have one) for your aftercare needs so that they can reassess your need for medications and monitor your lab values.  Follow up with PCP, Cardiology, Neurology, and Nephrology in the outpatient setting. Take all medications as prescribed. If symptoms change or worsen please return to the ED for evaluation   Increase activity slowly   Complete by:  As directed      Allergies as of 12/24/2018   No Known Allergies     Medication List    STOP taking these medications   lisinopril 10 MG tablet Commonly known as:  PRINIVIL,ZESTRIL   valproic acid 250 MG/5ML syrup Commonly known as:  DEPAKENE     TAKE these medications   acetaminophen 650 MG CR tablet Commonly known as:  TYLENOL Take 650 mg by mouth every 8 (eight) hours.   budesonide 0.25 MG/2ML nebulizer solution Commonly known as:  PULMICORT Take 2 mLs (0.25 mg total) by nebulization 2 (two) times daily.   busPIRone 5 MG tablet Commonly known as:  BUSPAR Take 2.5 mg by mouth 2 (two) times daily.   cetaphil lotion Apply topically 2 (two) times daily.   desonide 0.05 % ointment Commonly known as:  DESOWEN Apply 1 application topically 2 (two) times daily. For 14 days   diphenhydrAMINE 25 MG tablet Commonly known as:  BENADRYL Take 25 mg by mouth every 8 (eight) hours. For 7 days   divalproex 500 MG DR tablet Commonly known as:  DEPAKOTE Take 500 mg by mouth 3 (three) times  daily.   folic acid 1 MG tablet Commonly known as:  FOLVITE Take 1 tablet (1 mg total) by mouth daily.   gabapentin 300 MG capsule Commonly known as:  NEURONTIN Take 300 mg by mouth at bedtime.   hydrOXYzine 10 MG tablet Commonly known as:  ATARAX/VISTARIL Take 1 tablet (10 mg total) by mouth 3 (three)  times daily as needed for itching.   insulin aspart 100 UNIT/ML injection Commonly known as:  novoLOG Inject 10 Units into the skin 3 (three) times daily before meals. With meals if Blood Sugar is >180. Call MD if CBG >400.  Takes at [place], 1130, 1630   ipratropium-albuterol 0.5-2.5 (3) MG/3ML Soln Commonly known as:  DUONEB Take 3 mLs by nebulization every 6 (six) hours as needed. What changed:  reasons to take this   levETIRAcetam 250 MG tablet Commonly known as:  KEPPRA Take 1 tablet (250 mg total) by mouth 2 (two) times daily.   liraglutide 18 MG/3ML Sopn Commonly known as:  VICTOZA Inject 1.2 mg into the skin daily.   multivitamin with minerals Tabs tablet Take 1 tablet by mouth daily. Start taking on:  December 25, 2018   ondansetron 4 MG tablet Commonly known as:  ZOFRAN Take 1 tablet (4 mg total) by mouth every 6 (six) hours as needed for nausea.   predniSONE 10 MG tablet Commonly known as:  DELTASONE Take 40 mg for 1 Day, 30 mg for 2 days, 20 mg for 2 Days, and 10 mg x2 Days and then stop. What changed:    medication strength  how much to take  how to take this  when to take this  additional instructions   QUEtiapine 100 MG tablet Commonly known as:  SEROQUEL Take 100 mg by mouth 2 (two) times daily.   senna-docusate 8.6-50 MG tablet Commonly known as:  Senokot-S Take 1 tablet by mouth at bedtime as needed for mild constipation.   sertraline 100 MG tablet Commonly known as:  ZOLOFT Take 100 mg by mouth at bedtime.   sodium zirconium cyclosilicate 10 g Pack packet Commonly known as:  Lokelma Take 10 g by mouth daily for 7 days.   Toujeo Max  SoloStar 300 UNIT/ML Sopn Generic drug:  Insulin Glargine Inject 0-165 Units into the skin See admin instructions. Per Sliding Scale once daily 0-180 give 0 units 181-450 give 165 units      Contact information for after-discharge care    Destination    [facility] .   Service:  Skilled Nursing Contact information: [address] [phone]             No Known Allergies  Consultations:  Cardiology  Nephrology  Palliative Care  Procedures/Studies: Dg Chest 2 View  Result Date: 12/20/2018 CLINICAL DATA:  Wheezing EXAM: CHEST - 2 VIEW COMPARISON:  12/14/2018 FINDINGS: Cardiac shadow is stable. Mild aortic calcifications are seen. Lungs are well aerated bilaterally. Minimal left basilar atelectasis is seen. Old rib fractures and compression deformities are noted. No new focal abnormality is seen. IMPRESSION: Mild left basilar atelectasis.  No other focal abnormality is noted. Electronically Signed   By: Inez Catalina M.D.   On: 12/20/2018 14:42   Dg Chest 2 View  Result Date: 12/14/2018 CLINICAL DATA:  Chest pain.  Patient status post CPR today. EXAM: CHEST - 2 VIEW COMPARISON:  Single-view of the chest 12/30/2015. PA and lateral chest 11/19/2015. FINDINGS: Lung volumes are somewhat low with basilar atelectasis. No consolidative process, pneumothorax or pleural effusion. Heart size is upper normal. Remote left rib fractures, midthoracic compression fracture and advanced left glenohumeral osteoarthritis noted. No acute bony abnormality. IMPRESSION: No acute disease. Electronically Signed   By: Inge Rise M.D.   On: 12/14/2018 15:36   Ct Head Wo Contrast  Result Date: 12/14/2018 CLINICAL DATA:  Altered mental  status as patient found unresponsive in bed. EXAM: CT HEAD WITHOUT CONTRAST TECHNIQUE: Contiguous axial images were obtained from the base of the skull through the vertex without intravenous contrast.  COMPARISON:  12/30/2015 FINDINGS: Brain: Ventricles, cisterns and other CSF spaces are unchanged. There is mild age related atrophic change. There is chronic ischemic microvascular disease. There is no mass, mass effect, shift of midline structures or acute hemorrhage. No evidence of acute infarction. Vascular: No hyperdense vessel or unexpected calcification. Skull: Suggestion old nasal bone fracture. Sinuses/Orbits: Orbits are within normal. Complete opacification over the right maxillary sinus slightly worse. Other: None. IMPRESSION: No acute findings. Chronic ischemic microvascular disease and age related atrophic change. Moderate chronic sinus inflammatory change of the right maxillary sinus slightly worse. Findings suggesting old nasal bone fracture. Electronically Signed   By: Marin Olp M.D.   On: 12/14/2018 16:25   US Renal  Result Date: 12/21/2018 CLINICAL DATA:  Acute renal failure EXAM: RENAL / URINARY TRACT ULTRASOUND COMPLETE COMPARISON:  None. FINDINGS: Right Kidney: Renal measurements: 8.3 x 3.9 x 3.9 cm = volume: 66 mL. Renal cortical thinning. Echogenicity within normal limits. No mass or hydronephrosis visualized. Left Kidney: Renal measurements: 11.4 x 6 x 5.4 cm = volume: 193 mL. Echogenicity within normal limits. No mass or hydronephrosis visualized. Bladder: Appears normal for degree of bladder distention. Right bladder diverticulum. IMPRESSION: 1. No obstructive uropathy. 2. Atrophic right kidney with renal cortical thinning. 3. Left kidney is normal. Electronically Signed   By: Kathreen Devoid   On: 12/21/2018 11:24   Dg Chest Port 1 View  Result Date: 12/24/2018 CLINICAL DATA:  Short of breath EXAM: PORTABLE CHEST 1 VIEW COMPARISON:  12/23/2018 FINDINGS: Progression of bibasilar airspace disease. Progression of small bilateral effusions. Diffuse vascular congestion. IMPRESSION: Progression of vascular congestion and bilateral airspace disease most likely due to congestive heart  failure. Electronically Signed   By: Franchot Gallo M.D.   On: 12/24/2018 07:36   Dg Chest Port 1 View  Result Date: 12/23/2018 CLINICAL DATA:  Shortness of breath. EXAM: PORTABLE CHEST 1 VIEW COMPARISON:  12/22/2018 FINDINGS: Cardiomediastinal silhouette is stable. Central vascular markings remain mildly enlarged and prominent. Slightly prominent interstitial lung markings are unchanged. Negative for a pneumothorax. Extensive degenerative changes in the lower cervical spine and left shoulder. IMPRESSION: Chest radiograph findings have minimally changed. Central vascular structures and interstitial lung markings remain mildly prominent. Findings could represent vascular congestion or mild edema. Electronically Signed   By: Markus Daft M.D.   On: 12/23/2018 08:33   Dg Chest Port 1 View  Result Date: 12/22/2018 CLINICAL DATA:  Shortness of breath and wheezing. EXAM: PORTABLE CHEST 1 VIEW COMPARISON:  12/20/2018 FINDINGS: The heart is enlarged. Mild vascular congestion. No consolidation or frank edema. No effusion or pneumothorax. Degenerative change both shoulders. Slight worsening aeration. IMPRESSION: Cardiomegaly with mild vascular congestion. Slight worsening aeration. Electronically Signed   By: Staci Righter M.D.   On: 12/22/2018 10:11   ECHOCARDIOGRAM 12/15/2018 IMPRESSIONS   1. The left ventricle has mild-moderately reduced systolic function, with an ejection fraction of 40-45%. The cavity size was normal. There is mildly increased left ventricular wall thickness. Left ventricular diastolic Doppler parameters are consistent with pseudonormalization. 2. The right ventricle has normal systolic function. The cavity was normal. There is no increase in right ventricular wall thickness. 3. Left atrial size was mildly dilated. 4. No evidence of mitral valve stenosis. 5. The aortic valve is tricuspid no stenosis of the aortic valve. 6.  The aortic root and ascending aorta are normal in size  and structure. 7. The interatrial septum was not assessed.  FINDINGS Left Ventricle: The left ventricle has mild-moderately reduced systolic function, with an ejection fraction of 40-45%. The cavity size was normal. There is mildly increased left ventricular wall thickness. Left ventricular diastolic Doppler parameters  are consistent with pseudonormalization. Right Ventricle: The right ventricle has normal systolic function. The cavity was normal. There is no increase in right ventricular wall thickness. Left Atrium: left atrial size was mildly dilated Right Atrium: right atrial size was normal in size. Right atrial pressure is estimated at 3 mmHg. Interatrial Septum: The interatrial septum was not assessed. Pericardium: There is no evidence of pericardial effusion. Mitral Valve: The mitral valve is normal in structure. Mitral valve regurgitation is trivial by color flow Doppler. No evidence of mitral valve stenosis. Tricuspid Valve: The tricuspid valve is normal in structure. Tricuspid valve regurgitation is trivial by color flow Doppler. Aortic Valve: The aortic valve is tricuspid Aortic valve regurgitation was not visualized by color flow Doppler. There is no stenosis of the aortic valve. Pulmonic Valve: The pulmonic valve was grossly normal. Pulmonic valve regurgitation is trivial by color flow Doppler. Aorta: The aortic root and ascending aorta are normal in size and structure.  LEFT VENTRICLE PLAX 2D LVIDd: 5.00 cm Diastology LVIDs: 4.10 cm LV e' lateral: 5.87 cm/s LV PW: 0.90 cm LV E/e' lateral: 19.8 LV IVS: 1.20 cm LV e' medial: 5.11 cm/s LVOT diam: 2.10 cm LV E/e' medial: 22.7 LV SV: 44 ml LV SV Index: 19.90 LVOT Area: 3.46 cm  RIGHT VENTRICLE RV S prime: 11.90 cm/s TAPSE (M-mode): 2.7 cm  LEFT ATRIUM Index RIGHT ATRIUM Index LA diam: 4.00 cm 1.89 cm/m RA Pressure: 3  mmHg LA Vol (A2C): 93.3 ml 44.01 ml/m RA Area: 11.40 cm LA Vol (A4C): 57.3 ml 27.03 ml/m RA Volume: 25.70 ml 12.12 ml/m LA Biplane Vol: 77.8 ml 36.70 ml/m AORTIC VALVE LVOT Vmax: 76.90 cm/s LVOT Vmean: 50.900 cm/s LVOT VTI: 0.134 m  AORTA Ao Root diam: 3.00 cm  MV E velocity: 116.00 cm/s MV A velocity: 55.30 cm/s SHUNTS MV E/A ratio: 2.10 Systemic VTI: 0.13 m Systemic Diam: 2.10 cm  EEG Background: The background consists of intermixed alpha and beta activities. There is a well defined posterior dominant rhythm of 8 Hz that attenuates with eye opening. Sleep is not recorded.  Occasional left frontotemporal (F7 >T7 >Fp1) sharp waves are seen without periodicity or rhythmicity.  Photic stimulation: Physiologic driving is not performed  EEG Abnormalities: 1) interictal left frontotemporal sharp waves  Clinical Interpretation: This EEG is most consistent with a region of potential epileptogenicity(seizure focus) in the left frontotemporal region.    There was no seizure recorded on this study.   Subjective: Seen and examined at bedside and was doing well.  Denying chest pain, nausea or vomiting.  Felt back to his baseline and denies any shortness of breath.  Was joking and laughing as well.  No other concerns or complaints at this time but wanted to go home.   Discharge Exam: Vitals:   12/24/18 0732 12/24/18 1117  BP:  137/84  Pulse:  83  Resp:  16  Temp:  98.1 F (36.7 C)  SpO2: 94% 97%   Vitals:   12/24/18 0625 12/24/18 0643 12/24/18 0732 12/24/18 1117  BP: (!) 191/112 (!) 162/95  137/84  Pulse: 91 80  83  Resp: 20   16  Temp: 97.8 F (36.6  C)   98.1 F (36.7 C)  TempSrc: Oral   Oral  SpO2: 98%  94% 97%  Weight:      Height:       General: Pt is alert, awake, not in acute distress Cardiovascular: Slightly tachycardic, S1/S2 +, no rubs, no gallops Respiratory: Diminished bilaterally, no wheezing, no  rhonchi; Has some mild crackles Abdominal: Soft, NT, ND, bowel sounds + Extremities: Mild LE edema, no cyanosis  The results of significant diagnostics from this hospitalization (including imaging, microbiology, ancillary and laboratory) are listed below for reference.    Microbiology: Recent Results (from the past 240 hour(s))  MRSA PCR Screening     Status: Abnormal   Collection Time: 12/14/18  6:52 PM  Result Value Ref Range Status   MRSA by PCR POSITIVE (A) NEGATIVE Final    Comment:        The GeneXpert MRSA Assay (FDA approved for NASAL specimens only), is one component of a comprehensive MRSA colonization surveillance program. It is not intended to diagnose MRSA infection nor to guide or monitor treatment for MRSA infections. RESULT CALLED TO, READ BACK BY AND VERIFIED WITH: B RUDD RN 2047 12/14/18 A BROWNING Performed at [facility], [place] [address], [place], [place] [address]   Culture, Urine     Status: Abnormal   Collection Time: 12/23/18  6:02 PM  Result Value Ref Range Status   Specimen Description URINE, RANDOM  Final   Special Requests   Final    NONE Performed at [facility], [place] [address], [place], [place] [address]    Culture (A)  Final    20,000 COLONIES/mL MULTIPLE SPECIES PRESENT, SUGGEST RECOLLECTION   Report Status 12/24/2018 FINAL  Final    Labs: BNP (last 3 results) Recent Labs    12/23/18 0326  BNP 778.2*   Basic Metabolic Panel: Recent Labs  Lab 12/20/18 1006 12/21/18 0416 12/22/18 0505 12/23/18 0326 12/24/18 0333  NA 132* 129* 132* 136 135  K 5.6* 6.7* 5.8* 4.8 5.7*  CL 101 100 101 105 101  CO2 22 18* 21* 22 23  GLUCOSE 92 256* 162* 192* 238*  BUN 59* 54* 53* 47* 49*  CREATININE 3.76* 3.21* 2.53* 2.24* 2.09*  CALCIUM 8.6* 8.3* 8.5* 8.5* 9.0  MG  --   --   --  2.1 2.1  PHOS  --   --   --  3.5 2.7   Liver Function Tests: Recent Labs  Lab 12/23/18 0326 12/24/18 0333  AST 15 11*  ALT 17 16  ALKPHOS 48 51   BILITOT 0.6 0.5  PROT 5.4* 5.6*  ALBUMIN 3.0* 3.0*   No results for input(s): LIPASE, AMYLASE in the last 168 hours. No results for input(s): AMMONIA in the last 168 hours. CBC: Recent Labs  Lab 12/23/18 0326 12/24/18 0333  WBC 5.2 6.1  NEUTROABS 3.9 5.0  HGB 11.5* 11.7*  HCT 35.4* 36.2*  MCV 91.0 91.4  PLT 154 159   Cardiac Enzymes: No results for input(s): CKTOTAL, CKMB, CKMBINDEX, TROPONINI in the last 168 hours. BNP: Invalid input(s): POCBNP CBG: Recent Labs  Lab 12/23/18 1244 12/23/18 1627 12/23/18 2354 12/24/18 0645 12/24/18 1142  GLUCAP 203* 179* 215* 185* 182*   D-Dimer No results for input(s): DDIMER in the last 72 hours. Hgb A1c No results for input(s): HGBA1C in the last 72 hours. Lipid Profile No results for input(s): CHOL, HDL, LDLCALC, TRIG, CHOLHDL, LDLDIRECT in the last 72 hours. Thyroid function studies No  results for input(s): TSH, T4TOTAL, T3FREE, THYROIDAB in the last 72 hours.  Invalid input(s): FREET3 Anemia work up Recent Labs    12/24/18 0333  VITAMINB12 889  FOLATE 8.8  FERRITIN 76  TIBC 294  IRON 74  RETICCTPCT 1.1   Urinalysis    Component Value Date/Time   COLORURINE YELLOW 12/23/2018 1733   APPEARANCEUR CLOUDY (A) 12/23/2018 1733   LABSPEC 1.011 12/23/2018 1733   PHURINE 5.0 12/23/2018 1733   GLUCOSEU 150 (A) 12/23/2018 1733   GLUCOSEU 100 (A) 05/17/2009 2115   HGBUR SMALL (A) 12/23/2018 1733   BILIRUBINUR NEGATIVE 12/23/2018 1733   KETONESUR NEGATIVE 12/23/2018 1733   PROTEINUR 30 (A) 12/23/2018 1733   UROBILINOGEN 1.0 05/04/2015 0418   NITRITE NEGATIVE 12/23/2018 1733   LEUKOCYTESUR LARGE (A) 12/23/2018 1733   Sepsis Labs Invalid input(s): PROCALCITONIN,  WBC,  LACTICIDVEN Microbiology Recent Results (from the past 240 hour(s))  MRSA PCR Screening     Status: Abnormal   Collection Time: 12/14/18  6:52 PM  Result Value Ref Range Status   MRSA by PCR POSITIVE (A) NEGATIVE Final    Comment:        The  GeneXpert MRSA Assay (FDA approved for NASAL specimens only), is one component of a comprehensive MRSA colonization surveillance program. It is not intended to diagnose MRSA infection nor to guide or monitor treatment for MRSA infections. RESULT CALLED TO, READ BACK BY AND VERIFIED WITH: B RUDD RN 2047 12/14/18 A BROWNING Performed at [facility], [place] [address], [place], [place] [address]   Culture, Urine     Status: Abnormal   Collection Time: 12/23/18  6:02 PM  Result Value Ref Range Status   Specimen Description URINE, RANDOM  Final   Special Requests   Final    NONE Performed at [facility], [place] [address], [place], [place] [address]    Culture (A)  Final    20,000 COLONIES/mL MULTIPLE SPECIES PRESENT, SUGGEST RECOLLECTION   Report Status 12/24/2018 FINAL  Final   Time coordinating discharge: 35 minutes  SIGNED:  Kerney Elbe, DO [facility] 12/24/2018, 3:12 PM Pager is on [place]  If 7PM-7AM, please contact night-coverage [email or web address] Password [email or web address]

## 2018-12-24 NOTE — Progress Notes (Signed)
Inpatient Diabetes Program Recommendations  AACE/ADA: New Consensus Statement on Inpatient Glycemic Control (2015)  Target Ranges:  Prepandial:   less than 140 mg/dL      Peak postprandial:   less than 180 mg/dL (1-2 hours)      Critically ill patients:  140 - 180 mg/dL   Results for EUCLID, CASSETTA" (MRN [id number]) as of 12/24/2018 10:56  Ref. Range 12/23/2018 06:36 12/23/2018 11:30 12/23/2018 12:44 12/23/2018 16:27 12/23/2018 23:54 12/24/2018 06:45  Glucose-Capillary Latest Ref Range: 70 - 99 mg/dL 143 (H) 202 (H) 203 (H) 179 (H) 215 (H) 185 (H)   Home DM Meds: Toujeo 0 units if CBG <180 mg/dl       Toujeo 165 units if CBG 181-450 mg/dl       Novolog 10 units TID with meals if CBG >180 mg/dl       Victoza 1.2 mg Daily   Current Orders: Lantus 15 units QHS      Novolog Moderate Correction Scale/ SSI (0-15 units) TID AC + HS      Novolog 3 units TID with meals    MD- Please consider the following in-hospital insulin adjustments:  Increase Lantus slightly to 18 units QHS (fasting CBG still elevated) if patient is not d/c'd today.   --Will follow patient during hospitalization--  Tama Headings RN, MSN, BC-ADM Inpatient Diabetes Coordinator Team Pager [phone] (8a-5p)

## 2018-12-24 NOTE — TOC Transition Note (Signed)
Transition of Care [facility]) - CM/SW Discharge Note   Patient Details  Name: William [name] MRN: [id number] Date of Birth: 1946/04/16  [facility] CM/SW Contact:  Candie Chroman, LCSW Phone Number: 12/24/2018, 3:49 PM   Clinical Narrative: CSW facilitated patient discharge including contacting patient family (left voicemail for [name]) and facility to confirm patient discharge plans. Clinical information faxed to facility and family agreeable with plan. CSW arranged ambulance transport via [facility] to [facility] at 5:30. RN to call report prior to discharge [phone] Room 147A).  CSW will sign off for now as social work intervention is no longer needed. Please consult [place] again if new needs arise.  Final next level of care: Skilled Nursing Facility Barriers to Discharge: Barriers Resolved   Patient Goals and CMS Choice Patient states their goals for this hospitalization and ongoing recovery are:: Patient not fully oriented. CMS Medicare.gov Compare Post Acute Care list provided to:: Patient Represenative (must comment)(Gave scores to [name] for [facility] over the phone.) Choice offered to / list presented to : NA  Discharge Placement   Existing PASRR number confirmed : 12/16/18          Patient chooses bed at: ([facility]) Patient to be transferred to facility by: [place] Name of family member notified: Left voicemail for Briscoe Deutscher Patient and family notified of of transfer: 12/24/18  Discharge Plan and Services     Post Acute Care Choice: [place]                    Social Determinants of Health (SDOH) Interventions     Readmission Risk Interventions No flowsheet data found.

## 2018-12-24 NOTE — Progress Notes (Signed)
Physical Therapy Treatment Patient Details Name: William [name] MRN: [id number] DOB: 01-25-1946 Today's Date: 12/24/2018    History of Present Illness Pt is a 73 y.o. long-term resident at [facility] admitted on 12/14/18 with unresponsiveness requiring brief CPR (<1 min). Head CT negative for acute findings. CXR without acute disease. Hospital course with hyperkalemia and AKI on CKD III. PMH includes DM, seizures, HTN, hearing impairment, cataracts, CKD, alcohol abuse, probable dementia.    PT Comments    Patient progressing slowly towards PT goals. Improved ambulation distance today with max cues and encouragement. Pt with increased time to perform all mobility with consistent need for redirection. Requires increased motivation and coaxing. Pt requires Min A for gait training due to increased hip/knee flexion with wide boS and imbalance. 2/4 DOE noted. Continues to require SNF level therapies. Will follow. Not able to get Sp02 reading. Will follow.   Follow Up Recommendations  SNF;Supervision/Assistance - 24 hour     Equipment Recommendations  None recommended by PT    Recommendations for Other Services       Precautions / Restrictions Precautions Precautions: Fall Precaution Comments: HOH Restrictions Weight Bearing Restrictions: No    Mobility  Bed Mobility Overal bed mobility: Needs Assistance Bed Mobility: Supine to Sit     Supine to sit: Supervision;HOB elevated     General bed mobility comments: Increased time and effort, reliant on use of bed rail to pull into sitting. Able to scoot hips towards EOB well  Transfers Overall transfer level: Needs assistance Equipment used: Rolling walker (2 wheeled) Transfers: Sit to/from Stand Sit to Stand: Min guard;From elevated surface         General transfer comment: Min guard to stand from EOB with elevated bed height. transferred to chair post ambulation.  Ambulation/Gait Ambulation/Gait assistance: Min  assist Gait Distance (Feet): 15 Feet Assistive device: Rolling walker (2 wheeled) Gait Pattern/deviations: Step-through pattern;Step-to pattern;Trunk flexed;Wide base of support;Decreased stride length Gait velocity: Decreased   General Gait Details: Slow, unsteady gait with increased hip/knee flexion and wide BoS. Difficulty with RW navigation through small places. Sits prematurely increasing fall risk.   Stairs             Wheelchair Mobility    Modified Rankin (Stroke Patients Only)       Balance Overall balance assessment: Needs assistance Sitting-balance support: Single extremity supported;Feet supported Sitting balance-Leahy Scale: Good     Standing balance support: Bilateral upper extremity supported;During functional activity Standing balance-Leahy Scale: Poor Standing balance comment: reliant on RW                            Cognition Arousal/Alertness: Awake/alert Behavior During Therapy: WFL for tasks assessed/performed Overall Cognitive Status: History of cognitive impairments - at baseline                                 General Comments: Hx of dementia. very HOH. Tangential with speech. Follows commands consistently with repetition. Stalls with conversation instead of doing mobility. Redirected mostly.       Exercises      General Comments        Pertinent Vitals/Pain Pain Assessment: No/denies pain    Home Living                      Prior Function  PT Goals (current goals can now be found in the care plan section) Progress towards PT goals: Progressing toward goals    Frequency    Min 2X/week      PT Plan Current plan remains appropriate    Co-evaluation              AM-PAC PT "6 Clicks" Mobility   Outcome Measure  Help needed turning from your back to your side while in a flat bed without using bedrails?: None Help needed moving from lying on your back to sitting on the  side of a flat bed without using bedrails?: A Little Help needed moving to and from a bed to a chair (including a wheelchair)?: A Little Help needed standing up from a chair using your arms (e.g., wheelchair or bedside chair)?: A Little Help needed to walk in hospital room?: A Little Help needed climbing 3-5 steps with a railing? : A Lot 6 Click Score: 18    End of Session Equipment Utilized During Treatment: Gait belt Activity Tolerance: Patient tolerated treatment well;Patient limited by fatigue Patient left: in chair;with call bell/phone within reach;with chair alarm set Nurse Communication: Mobility status PT Visit Diagnosis: Other abnormalities of gait and mobility (R26.89)     Time: 9449-6759 PT Time Calculation (min) (ACUTE ONLY): 30 min  Charges:  $Gait Training: 8-22 mins $Therapeutic Activity: 8-22 mins                     Wray Kearns, PT, DPT Acute Rehabilitation Services Pager [phone] Office [phone]       Marguarite Arbour A Sabra Heck 12/24/2018, 10:17 AM

## 2018-12-24 NOTE — Progress Notes (Signed)
Report called to [facility] . Pt has resided there before and staff were familiar with him. No distress noted. All personal belongings with patient.

## 2018-12-24 NOTE — Progress Notes (Signed)
[name]    NEPHROLOGY PROGRESS NOTE  SUBJECTIVE:   Resting comfortably without complaints.  Denies any acute events.  No reports of chest pain, shortness of breath, nausea, vomiting, diarrhea or dysuria.  All other review of systems have been negative.   OBJECTIVE:  Vitals:   12/24/18 0732 12/24/18 1117  BP:  137/84  Pulse:  83  Resp:  16  Temp:  98.1 F (36.7 C)  SpO2: 94% 97%    Intake/Output Summary (Last 24 hours) at 12/24/2018 1232 Last data filed at 12/24/2018 1109 Gross per 24 hour  Intake 900 ml  Output 1850 ml  Net -950 ml      Genearl: Resting NAD HEENT: MMM [place] AT anicteric sclera Neck:  No JVD, no adenopathy CV:  Heart RRR  Lungs:  L/S CTA bilaterally Abd:  abd SNT/ND with normal BS GU:  Bladder non-palpable Extremities:  No LE edema. Skin:  No skin rash  MEDICATIONS:  . budesonide (PULMICORT) nebulizer solution  0.25 mg Nebulization BID  . busPIRone  5 mg Oral BID  . cetaphil   Topical BID  . divalproex  500 mg Oral Q8H  . feeding supplement (ENSURE ENLIVE)  237 mL Oral TID BM  . folic acid  1 mg Oral Daily  . furosemide  40 mg Intravenous Once  . heparin injection (subcutaneous)  5,000 Units Subcutaneous Q8H  . insulin aspart  0-15 Units Subcutaneous TID WC  . insulin aspart  0-5 Units Subcutaneous QHS  . insulin aspart  3 Units Subcutaneous TID WC  . insulin glargine  15 Units Subcutaneous QHS  . ipratropium-albuterol  3 mL Nebulization BID  . levETIRAcetam  250 mg Oral BID  . multivitamin with minerals  1 tablet Oral Daily  . predniSONE  40 mg Oral Q breakfast  . QUEtiapine  100 mg Oral BID  . sertraline  100 mg Oral QHS       LABS:   CBC Latest Ref Rng & Units 12/24/2018 12/23/2018 12/15/2018  WBC 4.0 - 10.5 K/uL 6.1 5.2 6.7  Hemoglobin 13.0 - 17.0 g/dL 11.7(L) 11.5(L) 13.2  Hematocrit 39.0 - 52.0 % 36.2(L) 35.4(L) 40.4  Platelets 150 - 400 K/uL 159 154 187    CMP Latest Ref Rng & Units 12/24/2018 12/23/2018 12/22/2018   Glucose 70 - 99 mg/dL 238(H) 192(H) 162(H)  BUN 8 - 23 mg/dL 49(H) 47(H) 53(H)  Creatinine 0.61 - 1.24 mg/dL 2.09(H) 2.24(H) 2.53(H)  Sodium 135 - 145 mmol/L 135 136 132(L)  Potassium 3.5 - 5.1 mmol/L 5.7(H) 4.8 5.8(H)  Chloride 98 - 111 mmol/L 101 105 101  CO2 22 - 32 mmol/L 23 22 21(L)  Calcium 8.9 - 10.3 mg/dL 9.0 8.5(L) 8.5(L)  Total Protein 6.5 - 8.1 g/dL 5.6(L) 5.4(L) -  Total Bilirubin 0.3 - 1.2 mg/dL 0.5 0.6 -  Alkaline Phos 38 - 126 U/L 51 48 -  AST 15 - 41 U/L 11(L) 15 -  ALT 0 - 44 U/L 16 17 -    Lab Results  Component Value Date   CALCIUM 9.0 12/24/2018   CAION 1.16 12/14/2018   PHOS 2.7 12/24/2018       Component Value Date/Time   COLORURINE YELLOW 12/23/2018 1733   APPEARANCEUR CLOUDY (A) 12/23/2018 1733   LABSPEC 1.011 12/23/2018 1733   PHURINE 5.0 12/23/2018 1733   GLUCOSEU 150 (A) 12/23/2018 1733   GLUCOSEU 100 (A) 05/17/2009 2115   HGBUR SMALL (A) 12/23/2018 1733   BILIRUBINUR NEGATIVE 12/23/2018 1733  KETONESUR NEGATIVE 12/23/2018 1733   PROTEINUR 30 (A) 12/23/2018 1733   UROBILINOGEN 1.0 05/04/2015 0418   NITRITE NEGATIVE 12/23/2018 1733   LEUKOCYTESUR LARGE (A) 12/23/2018 1733      Component Value Date/Time   HCO3 22.8 12/14/2018 1523   TCO2 24 12/14/2018 1523   ACIDBASEDEF 2.0 12/14/2018 1523   O2SAT 75.0 12/14/2018 1523       Component Value Date/Time   IRON 74 12/24/2018 0333   TIBC 294 12/24/2018 0333   FERRITIN 76 12/24/2018 0333   IRONPCTSAT 25 12/24/2018 0333       ASSESSMENT/PLAN:     1.  Hyperkalemia- May be from the heparin + Lisinopril; would discontinue subcutaneous heparin.  Continue Lokelma 10 mg daily for 1 week.  Would repeat potassium in 1 week.  Would also avoid Ensure as it is pretty high in potassium.  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  Would also recommend tight control of the diabetes, as hyperglycemia can cause solvent drag of potassium out of the  cells.  2. AKI on CKDIIIB - baseline cr likely in the 2.3-2.6 range as he's progressed since earlier in 2019. Renal ultrasoundshows atrophic RK so he is functioning with primarily the LK.  - UPC0.62and urine microscopyshows tons of WBC's; will check culture. Still not sent. Interstitial nephritis if cx is neg? Can f/u with [facility] upon d/c for CKD and ?interstitial nephritis. 3. AMS 4. HTN 5. DM 6. Dementia 7. H/o Seizures - on depakote and [place], DO, FACP

## 2018-12-28 DIAGNOSIS — R569 Unspecified convulsions: Secondary | ICD-10-CM | POA: Diagnosis not present

## 2018-12-28 DIAGNOSIS — R4182 Altered mental status, unspecified: Secondary | ICD-10-CM | POA: Diagnosis not present

## 2018-12-28 DIAGNOSIS — N179 Acute kidney failure, unspecified: Secondary | ICD-10-CM | POA: Diagnosis not present

## 2018-12-28 DIAGNOSIS — N183 Chronic kidney disease, stage 3 (moderate): Secondary | ICD-10-CM | POA: Diagnosis not present

## 2018-12-29 ENCOUNTER — Telehealth: Payer: Self-pay | Admitting: Neurology

## 2018-12-29 NOTE — Telephone Encounter (Signed)
The patient resides in an extended care facility, had an event on 14 December 2018 where he had decreased responsiveness, with a presumed seizure.  His Depakote level was low coming in, they apparently added Keppra to the Depakote, the patient may require an increased dose of Depakote.  May not be able to get a WebEx interview, could try to get a telephone evaluation.

## 2018-12-29 NOTE — Telephone Encounter (Signed)
I called to confirm patient's appointment for April 6th. He was d/c from the hospital on 3/27 to [place]. He was brought to the ED from [facility], and 1 minute of CPR. It was felt he had a seizure. I tried to call and arrange for telemedicine. The nurse didn't think they were set up for that, I was put on hold to talk with the administrator and no one ever answered. This patient will need a revisit for his ED admission. Please try to call tomorrow to see if they are set up for telemedicine and if not need to get him on the schedule to see post-hospital d/c. With me or Dr. Jannifer Franklin.

## 2018-12-30 ENCOUNTER — Telehealth: Payer: Self-pay | Admitting: Neurology

## 2018-12-30 NOTE — Telephone Encounter (Signed)
I called the patient who is at [facility]. I have arranged for a telephone call 4/7 at 11:15. I talked with his nurse, darius. I will call the nurses station and he will take the phone to the patient. At this time they don't have the ability to do virtual visit.

## 2018-12-31 DIAGNOSIS — G309 Alzheimer's disease, unspecified: Secondary | ICD-10-CM | POA: Diagnosis not present

## 2018-12-31 DIAGNOSIS — R69 Illness, unspecified: Secondary | ICD-10-CM | POA: Diagnosis not present

## 2018-12-31 DIAGNOSIS — D649 Anemia, unspecified: Secondary | ICD-10-CM | POA: Diagnosis not present

## 2018-12-31 DIAGNOSIS — F0281 Dementia in other diseases classified elsewhere with behavioral disturbance: Secondary | ICD-10-CM | POA: Diagnosis not present

## 2018-12-31 DIAGNOSIS — G40501 Epileptic seizures related to external causes, not intractable, with status epilepticus: Secondary | ICD-10-CM | POA: Diagnosis not present

## 2018-12-31 DIAGNOSIS — Z79899 Other long term (current) drug therapy: Secondary | ICD-10-CM | POA: Diagnosis not present

## 2019-01-03 ENCOUNTER — Ambulatory Visit: Payer: Medicare Other | Admitting: Adult Health

## 2019-01-03 ENCOUNTER — Ambulatory Visit: Payer: Medicare Other | Admitting: Neurology

## 2019-01-03 DIAGNOSIS — E119 Type 2 diabetes mellitus without complications: Secondary | ICD-10-CM | POA: Diagnosis not present

## 2019-01-03 DIAGNOSIS — G309 Alzheimer's disease, unspecified: Secondary | ICD-10-CM | POA: Diagnosis not present

## 2019-01-03 DIAGNOSIS — D649 Anemia, unspecified: Secondary | ICD-10-CM | POA: Diagnosis not present

## 2019-01-03 DIAGNOSIS — G40501 Epileptic seizures related to external causes, not intractable, with status epilepticus: Secondary | ICD-10-CM | POA: Diagnosis not present

## 2019-01-04 ENCOUNTER — Ambulatory Visit (INDEPENDENT_AMBULATORY_CARE_PROVIDER_SITE_OTHER): Payer: Medicare Other | Admitting: Neurology

## 2019-01-04 ENCOUNTER — Encounter: Payer: Self-pay | Admitting: Neurology

## 2019-01-04 ENCOUNTER — Other Ambulatory Visit: Payer: Self-pay

## 2019-01-04 DIAGNOSIS — R569 Unspecified convulsions: Secondary | ICD-10-CM

## 2019-01-04 DIAGNOSIS — R269 Unspecified abnormalities of gait and mobility: Secondary | ICD-10-CM | POA: Diagnosis not present

## 2019-01-04 NOTE — Progress Notes (Signed)
Virtual Visit via Video Note  I connected with William [name] on 01/04/19 at 11:15 AM EDT by a video enabled telemedicine application and verified that I am speaking with the correct person using two identifiers.   I discussed the limitations of evaluation and management by telemedicine and the availability of in person appointments. The patient expressed understanding and agreed to proceed.  History of Present Illness: 01/04/2019 SS: William [name] is a 73 year old male with a history of seizures, alcohol abuse.  He is currently residing at [facility].  He had a hospitalization in March 2020 where he had an episode of decreased responsiveness, presumed to be a seizure.  At that time his Depakote level was low (29), he was discharged home on continued Depakote 500 mg 3 times daily with the addition of Keppra 250 mg twice daily. Prior to this it appears his last seizure was in 2017, with a 10 minute seizure event, was described as tonic clonic.   Today I talked on the telephone with William [name] and his nurse Tanzania.  He reports that he is doing pretty good.  He denies any problems or concerns.  His nurse administers his medications.  He has been using a wheelchair, is able to stand her up to the toilet or the bed.  He denies any falls.  He denies any dizziness or feeling off balance.  He reports that he does remember the event whenever he went to the hospital.  He says he does remember that they performed CPR for 1 minute on him.  According to his nurse he has been doing well, she reports he has had a very nice attitude since being discharged from the hospital.  In the past he has history of alcohol abuse, he reports he has not been drinking.  At baseline he has some slurred speech.  He reports he does have family nearby however they have not been able to visit due to the code bed restrictions.  He reports he has a good appetite.  He does tell me that he has a roommate that he enjoys.  He has not  had any seizure events.  He has hard hearing.  12/31/2017: William [name] is a 73 year old right-handed white male with a history of alcohol abuse previously and a history of seizures.  The patient is on Depakote, he seems to tolerate this fairly well.  He has a gait disorder, he generally is mobilizing in a wheelchair, he can stand up but really cannot effectively walk.  He does take a few steps with physical therapy.  The patient reports at least one fall since last seen.  He reports no further seizures.  He complains of problems with urinary incontinence that has been an issue for him recently.  The patient has to wear adult diapers.  The patient resides in an extended care facility, he needs some assistance with bathing and dressing.  He comes to this office for an evaluation.   Observations/Objective: Alert, speech is slurred, difficult to understand via telephone, fair historian  Assessment and Plan: 1.  History of seizures, hospitalization in March 2020 for presumed seizure event  Overall he appears to be doing well.  He has not had any further seizure events.  He will continue taking Depakote 500 mg 3 times daily, Keppra 250 mg twice daily.  He appears to be tolerating the medications okay.  I spoke with his nurse Tanzania, have asked her to obtain a Depakote blood level, CBC, CMP, ammonia.  I have asked her to call our office when the blood level results.  I talked with Dr. Jannifer Franklin about the plan for this patient.  We will check a blood level, if the Depakote level is low may need to increase the Depakote by 500 mg in the morning, 500 mg at lunch, 1000 mg at bedtime.  We will try to get him off the Keppra if possible.  He will follow-up for a revisit in the office in 3 to 4 months.  Follow Up Instructions:  3-4 months for revisit   I discussed the assessment and treatment plan with the patient. The patient was provided an opportunity to ask questions and all were answered. The patient agreed  with the plan and demonstrated an understanding of the instructions.   The patient was advised to call back or seek an in-person evaluation if the symptoms worsen or if the condition fails to improve as anticipated.  I provided 30 minutes of non-face-to-face time during this encounter.   Evangeline Dakin, DNP  [facility] [address], [place], [place] [address] [phone]

## 2019-01-04 NOTE — Progress Notes (Signed)
I have read the note, and I agree with the clinical assessment and plan.  Dvid Pendry K Drenda Sobecki   

## 2019-01-06 ENCOUNTER — Telehealth: Payer: Self-pay | Admitting: Neurology

## 2019-01-06 NOTE — Telephone Encounter (Signed)
Patient's lab work resulted, ammonia 53 (22-87), glucose 69, BUN 38, creatinine 2.07, potassium 5.5, CO2 17, hemoglobin 14.4, valproic acid level 58 (50-100).  The entire results should be scanned into the record.  ALT was 28, AST was 27.   In the setting of likely recurrent seizure causing hospitalization we will increase his Depakote.  He is currently taking Depakote DR 500 mg 3 times daily, Keppra 250 mg twice daily. He is tolerating the medication okay, lab work is within normal limits.  We will increase his Depakote to 500 mg in the morning, 500 mg at lunch, 1000 mg in the evening.  He will stay on Keppra and Depakote for 2 weeks.  After that we will try to decrease the Keppra by having him take 250 mg once daily for 1 week then can discontinue the Keppra.  We should recheck a Depakote, ammonia level, cbc, cmp 6 weeks from this date.   It is worth noting his baseline creatinine is 2.3-2.6 per nephrology.  His potassium was elevated today, while in the hospital he got as high as 6.7.  This should continue to be monitored in the nursing home by primary care provider. He had AKI in the hospital.   Spoke with Arminda Resides, RN. Took verbal order.  He will ensure that the primary care team at [facility] is made aware of the lab results.  They will need to follow his potassium, creatinine.  I advised him to monitor for signs of toxicity, let us know of any seizure activity.

## 2019-01-07 DIAGNOSIS — D649 Anemia, unspecified: Secondary | ICD-10-CM | POA: Diagnosis not present

## 2019-01-07 DIAGNOSIS — E119 Type 2 diabetes mellitus without complications: Secondary | ICD-10-CM | POA: Diagnosis not present

## 2019-01-07 DIAGNOSIS — F0281 Dementia in other diseases classified elsewhere with behavioral disturbance: Secondary | ICD-10-CM | POA: Diagnosis not present

## 2019-01-07 DIAGNOSIS — N183 Chronic kidney disease, stage 3 (moderate): Secondary | ICD-10-CM | POA: Diagnosis not present

## 2019-01-14 DIAGNOSIS — D649 Anemia, unspecified: Secondary | ICD-10-CM | POA: Diagnosis not present

## 2019-01-14 DIAGNOSIS — G40501 Epileptic seizures related to external causes, not intractable, with status epilepticus: Secondary | ICD-10-CM | POA: Diagnosis not present

## 2019-01-14 DIAGNOSIS — E119 Type 2 diabetes mellitus without complications: Secondary | ICD-10-CM | POA: Diagnosis not present

## 2019-01-14 DIAGNOSIS — F0281 Dementia in other diseases classified elsewhere with behavioral disturbance: Secondary | ICD-10-CM | POA: Diagnosis not present

## 2019-01-21 DIAGNOSIS — F0281 Dementia in other diseases classified elsewhere with behavioral disturbance: Secondary | ICD-10-CM | POA: Diagnosis not present

## 2019-01-21 DIAGNOSIS — D649 Anemia, unspecified: Secondary | ICD-10-CM | POA: Diagnosis not present

## 2019-01-21 DIAGNOSIS — G40501 Epileptic seizures related to external causes, not intractable, with status epilepticus: Secondary | ICD-10-CM | POA: Diagnosis not present

## 2019-01-21 DIAGNOSIS — E119 Type 2 diabetes mellitus without complications: Secondary | ICD-10-CM | POA: Diagnosis not present

## 2019-01-31 DIAGNOSIS — D649 Anemia, unspecified: Secondary | ICD-10-CM | POA: Diagnosis not present

## 2019-01-31 DIAGNOSIS — G40501 Epileptic seizures related to external causes, not intractable, with status epilepticus: Secondary | ICD-10-CM | POA: Diagnosis not present

## 2019-01-31 DIAGNOSIS — E119 Type 2 diabetes mellitus without complications: Secondary | ICD-10-CM | POA: Diagnosis not present

## 2019-01-31 DIAGNOSIS — F0281 Dementia in other diseases classified elsewhere with behavioral disturbance: Secondary | ICD-10-CM | POA: Diagnosis not present

## 2019-02-04 DIAGNOSIS — Z9114 Patient's other noncompliance with medication regimen: Secondary | ICD-10-CM | POA: Diagnosis not present

## 2019-02-04 DIAGNOSIS — G40501 Epileptic seizures related to external causes, not intractable, with status epilepticus: Secondary | ICD-10-CM | POA: Diagnosis not present

## 2019-02-16 ENCOUNTER — Telehealth: Payer: Self-pay | Admitting: *Deleted

## 2019-02-16 NOTE — Telephone Encounter (Signed)
LMVM for scheduler at [facility] in [place] to return call to schedule 3 mo follow up appointment with [facility], NP from 01-04-19.   When returns call please make appointment.

## 2019-02-18 DIAGNOSIS — G40501 Epileptic seizures related to external causes, not intractable, with status epilepticus: Secondary | ICD-10-CM | POA: Diagnosis not present

## 2019-02-18 DIAGNOSIS — F28 Other psychotic disorder not due to a substance or known physiological condition: Secondary | ICD-10-CM | POA: Diagnosis not present

## 2019-02-18 DIAGNOSIS — G309 Alzheimer's disease, unspecified: Secondary | ICD-10-CM | POA: Diagnosis not present

## 2019-02-18 DIAGNOSIS — F333 Major depressive disorder, recurrent, severe with psychotic symptoms: Secondary | ICD-10-CM | POA: Diagnosis not present

## 2019-03-02 DIAGNOSIS — R569 Unspecified convulsions: Secondary | ICD-10-CM | POA: Diagnosis not present

## 2019-03-02 DIAGNOSIS — Z79899 Other long term (current) drug therapy: Secondary | ICD-10-CM | POA: Diagnosis not present

## 2019-03-05 DIAGNOSIS — E119 Type 2 diabetes mellitus without complications: Secondary | ICD-10-CM | POA: Diagnosis not present

## 2019-03-05 DIAGNOSIS — F0281 Dementia in other diseases classified elsewhere with behavioral disturbance: Secondary | ICD-10-CM | POA: Diagnosis not present

## 2019-03-05 DIAGNOSIS — Z741 Need for assistance with personal care: Secondary | ICD-10-CM | POA: Diagnosis not present

## 2019-03-05 DIAGNOSIS — E785 Hyperlipidemia, unspecified: Secondary | ICD-10-CM | POA: Diagnosis not present

## 2019-03-05 DIAGNOSIS — E118 Type 2 diabetes mellitus with unspecified complications: Secondary | ICD-10-CM | POA: Diagnosis not present

## 2019-03-05 DIAGNOSIS — M6281 Muscle weakness (generalized): Secondary | ICD-10-CM | POA: Diagnosis not present

## 2019-03-05 DIAGNOSIS — D649 Anemia, unspecified: Secondary | ICD-10-CM | POA: Diagnosis not present

## 2019-03-05 DIAGNOSIS — R569 Unspecified convulsions: Secondary | ICD-10-CM | POA: Diagnosis not present

## 2019-03-05 DIAGNOSIS — R2689 Other abnormalities of gait and mobility: Secondary | ICD-10-CM | POA: Diagnosis not present

## 2019-03-07 DIAGNOSIS — E118 Type 2 diabetes mellitus with unspecified complications: Secondary | ICD-10-CM | POA: Diagnosis not present

## 2019-03-07 DIAGNOSIS — M6281 Muscle weakness (generalized): Secondary | ICD-10-CM | POA: Diagnosis not present

## 2019-03-07 DIAGNOSIS — G309 Alzheimer's disease, unspecified: Secondary | ICD-10-CM | POA: Diagnosis not present

## 2019-03-07 DIAGNOSIS — R569 Unspecified convulsions: Secondary | ICD-10-CM | POA: Diagnosis not present

## 2019-03-07 DIAGNOSIS — F329 Major depressive disorder, single episode, unspecified: Secondary | ICD-10-CM | POA: Diagnosis not present

## 2019-03-07 DIAGNOSIS — E708 Other disorders of aromatic amino-acid metabolism: Secondary | ICD-10-CM | POA: Diagnosis not present

## 2019-03-07 DIAGNOSIS — E785 Hyperlipidemia, unspecified: Secondary | ICD-10-CM | POA: Diagnosis not present

## 2019-03-07 DIAGNOSIS — G40501 Epileptic seizures related to external causes, not intractable, with status epilepticus: Secondary | ICD-10-CM | POA: Diagnosis not present

## 2019-03-07 DIAGNOSIS — F0281 Dementia in other diseases classified elsewhere with behavioral disturbance: Secondary | ICD-10-CM | POA: Diagnosis not present

## 2019-03-07 DIAGNOSIS — R2689 Other abnormalities of gait and mobility: Secondary | ICD-10-CM | POA: Diagnosis not present

## 2019-03-07 DIAGNOSIS — D649 Anemia, unspecified: Secondary | ICD-10-CM | POA: Diagnosis not present

## 2019-03-07 DIAGNOSIS — Z741 Need for assistance with personal care: Secondary | ICD-10-CM | POA: Diagnosis not present

## 2019-03-08 DIAGNOSIS — Z741 Need for assistance with personal care: Secondary | ICD-10-CM | POA: Diagnosis not present

## 2019-03-08 DIAGNOSIS — M6281 Muscle weakness (generalized): Secondary | ICD-10-CM | POA: Diagnosis not present

## 2019-03-08 DIAGNOSIS — R2689 Other abnormalities of gait and mobility: Secondary | ICD-10-CM | POA: Diagnosis not present

## 2019-03-08 DIAGNOSIS — F0281 Dementia in other diseases classified elsewhere with behavioral disturbance: Secondary | ICD-10-CM | POA: Diagnosis not present

## 2019-03-10 DIAGNOSIS — R2689 Other abnormalities of gait and mobility: Secondary | ICD-10-CM | POA: Diagnosis not present

## 2019-03-10 DIAGNOSIS — M6281 Muscle weakness (generalized): Secondary | ICD-10-CM | POA: Diagnosis not present

## 2019-03-10 DIAGNOSIS — F0281 Dementia in other diseases classified elsewhere with behavioral disturbance: Secondary | ICD-10-CM | POA: Diagnosis not present

## 2019-03-10 DIAGNOSIS — Z741 Need for assistance with personal care: Secondary | ICD-10-CM | POA: Diagnosis not present

## 2019-03-11 DIAGNOSIS — M6281 Muscle weakness (generalized): Secondary | ICD-10-CM | POA: Diagnosis not present

## 2019-03-11 DIAGNOSIS — Z741 Need for assistance with personal care: Secondary | ICD-10-CM | POA: Diagnosis not present

## 2019-03-11 DIAGNOSIS — G309 Alzheimer's disease, unspecified: Secondary | ICD-10-CM | POA: Diagnosis not present

## 2019-03-11 DIAGNOSIS — R2689 Other abnormalities of gait and mobility: Secondary | ICD-10-CM | POA: Diagnosis not present

## 2019-03-11 DIAGNOSIS — G40501 Epileptic seizures related to external causes, not intractable, with status epilepticus: Secondary | ICD-10-CM | POA: Diagnosis not present

## 2019-03-11 DIAGNOSIS — F0281 Dementia in other diseases classified elsewhere with behavioral disturbance: Secondary | ICD-10-CM | POA: Diagnosis not present

## 2019-03-11 DIAGNOSIS — F329 Major depressive disorder, single episode, unspecified: Secondary | ICD-10-CM | POA: Diagnosis not present

## 2019-03-15 DIAGNOSIS — R2689 Other abnormalities of gait and mobility: Secondary | ICD-10-CM | POA: Diagnosis not present

## 2019-03-15 DIAGNOSIS — F0281 Dementia in other diseases classified elsewhere with behavioral disturbance: Secondary | ICD-10-CM | POA: Diagnosis not present

## 2019-03-15 DIAGNOSIS — Z741 Need for assistance with personal care: Secondary | ICD-10-CM | POA: Diagnosis not present

## 2019-03-15 DIAGNOSIS — M6281 Muscle weakness (generalized): Secondary | ICD-10-CM | POA: Diagnosis not present

## 2019-03-16 DIAGNOSIS — R2689 Other abnormalities of gait and mobility: Secondary | ICD-10-CM | POA: Diagnosis not present

## 2019-03-16 DIAGNOSIS — Z741 Need for assistance with personal care: Secondary | ICD-10-CM | POA: Diagnosis not present

## 2019-03-16 DIAGNOSIS — M6281 Muscle weakness (generalized): Secondary | ICD-10-CM | POA: Diagnosis not present

## 2019-03-16 DIAGNOSIS — F0281 Dementia in other diseases classified elsewhere with behavioral disturbance: Secondary | ICD-10-CM | POA: Diagnosis not present

## 2019-03-17 DIAGNOSIS — R2689 Other abnormalities of gait and mobility: Secondary | ICD-10-CM | POA: Diagnosis not present

## 2019-03-17 DIAGNOSIS — Z741 Need for assistance with personal care: Secondary | ICD-10-CM | POA: Diagnosis not present

## 2019-03-17 DIAGNOSIS — M6281 Muscle weakness (generalized): Secondary | ICD-10-CM | POA: Diagnosis not present

## 2019-03-17 DIAGNOSIS — F0281 Dementia in other diseases classified elsewhere with behavioral disturbance: Secondary | ICD-10-CM | POA: Diagnosis not present

## 2019-03-18 DIAGNOSIS — F0281 Dementia in other diseases classified elsewhere with behavioral disturbance: Secondary | ICD-10-CM | POA: Diagnosis not present

## 2019-03-18 DIAGNOSIS — M6281 Muscle weakness (generalized): Secondary | ICD-10-CM | POA: Diagnosis not present

## 2019-03-18 DIAGNOSIS — R2689 Other abnormalities of gait and mobility: Secondary | ICD-10-CM | POA: Diagnosis not present

## 2019-03-18 DIAGNOSIS — Z741 Need for assistance with personal care: Secondary | ICD-10-CM | POA: Diagnosis not present

## 2019-03-21 DIAGNOSIS — M6281 Muscle weakness (generalized): Secondary | ICD-10-CM | POA: Diagnosis not present

## 2019-03-21 DIAGNOSIS — F0281 Dementia in other diseases classified elsewhere with behavioral disturbance: Secondary | ICD-10-CM | POA: Diagnosis not present

## 2019-03-21 DIAGNOSIS — R2689 Other abnormalities of gait and mobility: Secondary | ICD-10-CM | POA: Diagnosis not present

## 2019-03-21 DIAGNOSIS — Z741 Need for assistance with personal care: Secondary | ICD-10-CM | POA: Diagnosis not present

## 2019-03-22 DIAGNOSIS — R2689 Other abnormalities of gait and mobility: Secondary | ICD-10-CM | POA: Diagnosis not present

## 2019-03-22 DIAGNOSIS — Z741 Need for assistance with personal care: Secondary | ICD-10-CM | POA: Diagnosis not present

## 2019-03-22 DIAGNOSIS — M6281 Muscle weakness (generalized): Secondary | ICD-10-CM | POA: Diagnosis not present

## 2019-03-22 DIAGNOSIS — F0281 Dementia in other diseases classified elsewhere with behavioral disturbance: Secondary | ICD-10-CM | POA: Diagnosis not present

## 2019-03-23 DIAGNOSIS — M6281 Muscle weakness (generalized): Secondary | ICD-10-CM | POA: Diagnosis not present

## 2019-03-23 DIAGNOSIS — Z741 Need for assistance with personal care: Secondary | ICD-10-CM | POA: Diagnosis not present

## 2019-03-23 DIAGNOSIS — F0281 Dementia in other diseases classified elsewhere with behavioral disturbance: Secondary | ICD-10-CM | POA: Diagnosis not present

## 2019-03-23 DIAGNOSIS — R2689 Other abnormalities of gait and mobility: Secondary | ICD-10-CM | POA: Diagnosis not present

## 2019-03-24 DIAGNOSIS — F0281 Dementia in other diseases classified elsewhere with behavioral disturbance: Secondary | ICD-10-CM | POA: Diagnosis not present

## 2019-03-24 DIAGNOSIS — M6281 Muscle weakness (generalized): Secondary | ICD-10-CM | POA: Diagnosis not present

## 2019-03-24 DIAGNOSIS — R2689 Other abnormalities of gait and mobility: Secondary | ICD-10-CM | POA: Diagnosis not present

## 2019-03-24 DIAGNOSIS — Z741 Need for assistance with personal care: Secondary | ICD-10-CM | POA: Diagnosis not present

## 2019-03-25 DIAGNOSIS — R2689 Other abnormalities of gait and mobility: Secondary | ICD-10-CM | POA: Diagnosis not present

## 2019-03-25 DIAGNOSIS — F0281 Dementia in other diseases classified elsewhere with behavioral disturbance: Secondary | ICD-10-CM | POA: Diagnosis not present

## 2019-03-25 DIAGNOSIS — Z741 Need for assistance with personal care: Secondary | ICD-10-CM | POA: Diagnosis not present

## 2019-03-25 DIAGNOSIS — M6281 Muscle weakness (generalized): Secondary | ICD-10-CM | POA: Diagnosis not present

## 2019-03-28 DIAGNOSIS — F0281 Dementia in other diseases classified elsewhere with behavioral disturbance: Secondary | ICD-10-CM | POA: Diagnosis not present

## 2019-03-28 DIAGNOSIS — R2689 Other abnormalities of gait and mobility: Secondary | ICD-10-CM | POA: Diagnosis not present

## 2019-03-28 DIAGNOSIS — M6281 Muscle weakness (generalized): Secondary | ICD-10-CM | POA: Diagnosis not present

## 2019-03-28 DIAGNOSIS — Z741 Need for assistance with personal care: Secondary | ICD-10-CM | POA: Diagnosis not present

## 2019-03-29 DIAGNOSIS — M6281 Muscle weakness (generalized): Secondary | ICD-10-CM | POA: Diagnosis not present

## 2019-03-29 DIAGNOSIS — R2689 Other abnormalities of gait and mobility: Secondary | ICD-10-CM | POA: Diagnosis not present

## 2019-03-29 DIAGNOSIS — Z741 Need for assistance with personal care: Secondary | ICD-10-CM | POA: Diagnosis not present

## 2019-03-29 DIAGNOSIS — F0281 Dementia in other diseases classified elsewhere with behavioral disturbance: Secondary | ICD-10-CM | POA: Diagnosis not present

## 2019-03-30 DIAGNOSIS — Z741 Need for assistance with personal care: Secondary | ICD-10-CM | POA: Diagnosis not present

## 2019-03-30 DIAGNOSIS — M6281 Muscle weakness (generalized): Secondary | ICD-10-CM | POA: Diagnosis not present

## 2019-03-30 DIAGNOSIS — R2689 Other abnormalities of gait and mobility: Secondary | ICD-10-CM | POA: Diagnosis not present

## 2019-03-30 DIAGNOSIS — F0281 Dementia in other diseases classified elsewhere with behavioral disturbance: Secondary | ICD-10-CM | POA: Diagnosis not present

## 2019-03-31 DIAGNOSIS — F0281 Dementia in other diseases classified elsewhere with behavioral disturbance: Secondary | ICD-10-CM | POA: Diagnosis not present

## 2019-03-31 DIAGNOSIS — Z741 Need for assistance with personal care: Secondary | ICD-10-CM | POA: Diagnosis not present

## 2019-03-31 DIAGNOSIS — M6281 Muscle weakness (generalized): Secondary | ICD-10-CM | POA: Diagnosis not present

## 2019-03-31 DIAGNOSIS — R2689 Other abnormalities of gait and mobility: Secondary | ICD-10-CM | POA: Diagnosis not present

## 2019-04-01 DIAGNOSIS — Z741 Need for assistance with personal care: Secondary | ICD-10-CM | POA: Diagnosis not present

## 2019-04-01 DIAGNOSIS — F0281 Dementia in other diseases classified elsewhere with behavioral disturbance: Secondary | ICD-10-CM | POA: Diagnosis not present

## 2019-04-01 DIAGNOSIS — M6281 Muscle weakness (generalized): Secondary | ICD-10-CM | POA: Diagnosis not present

## 2019-04-01 DIAGNOSIS — R2689 Other abnormalities of gait and mobility: Secondary | ICD-10-CM | POA: Diagnosis not present

## 2019-04-04 ENCOUNTER — Telehealth: Payer: Self-pay

## 2019-04-04 NOTE — Telephone Encounter (Signed)
William [name] with [facility] called to reschedule appt due to covid 19 concerns. Pt rescheduled to 04/20/19 at 1 pm check in at 1230.

## 2019-04-04 NOTE — Telephone Encounter (Signed)
Noted  

## 2019-04-05 ENCOUNTER — Ambulatory Visit: Payer: Medicare Other | Admitting: Neurology

## 2019-04-05 DIAGNOSIS — R2689 Other abnormalities of gait and mobility: Secondary | ICD-10-CM | POA: Diagnosis not present

## 2019-04-05 DIAGNOSIS — F0281 Dementia in other diseases classified elsewhere with behavioral disturbance: Secondary | ICD-10-CM | POA: Diagnosis not present

## 2019-04-05 DIAGNOSIS — M6281 Muscle weakness (generalized): Secondary | ICD-10-CM | POA: Diagnosis not present

## 2019-04-05 DIAGNOSIS — Z741 Need for assistance with personal care: Secondary | ICD-10-CM | POA: Diagnosis not present

## 2019-04-06 DIAGNOSIS — M6281 Muscle weakness (generalized): Secondary | ICD-10-CM | POA: Diagnosis not present

## 2019-04-06 DIAGNOSIS — R21 Rash and other nonspecific skin eruption: Secondary | ICD-10-CM | POA: Diagnosis not present

## 2019-04-06 DIAGNOSIS — R2689 Other abnormalities of gait and mobility: Secondary | ICD-10-CM | POA: Diagnosis not present

## 2019-04-06 DIAGNOSIS — F0281 Dementia in other diseases classified elsewhere with behavioral disturbance: Secondary | ICD-10-CM | POA: Diagnosis not present

## 2019-04-06 DIAGNOSIS — Z741 Need for assistance with personal care: Secondary | ICD-10-CM | POA: Diagnosis not present

## 2019-04-07 DIAGNOSIS — R2689 Other abnormalities of gait and mobility: Secondary | ICD-10-CM | POA: Diagnosis not present

## 2019-04-07 DIAGNOSIS — Z741 Need for assistance with personal care: Secondary | ICD-10-CM | POA: Diagnosis not present

## 2019-04-07 DIAGNOSIS — F0281 Dementia in other diseases classified elsewhere with behavioral disturbance: Secondary | ICD-10-CM | POA: Diagnosis not present

## 2019-04-07 DIAGNOSIS — M6281 Muscle weakness (generalized): Secondary | ICD-10-CM | POA: Diagnosis not present

## 2019-04-08 DIAGNOSIS — R2689 Other abnormalities of gait and mobility: Secondary | ICD-10-CM | POA: Diagnosis not present

## 2019-04-08 DIAGNOSIS — Z741 Need for assistance with personal care: Secondary | ICD-10-CM | POA: Diagnosis not present

## 2019-04-08 DIAGNOSIS — F0281 Dementia in other diseases classified elsewhere with behavioral disturbance: Secondary | ICD-10-CM | POA: Diagnosis not present

## 2019-04-08 DIAGNOSIS — M6281 Muscle weakness (generalized): Secondary | ICD-10-CM | POA: Diagnosis not present

## 2019-04-12 DIAGNOSIS — M6281 Muscle weakness (generalized): Secondary | ICD-10-CM | POA: Diagnosis not present

## 2019-04-12 DIAGNOSIS — R2689 Other abnormalities of gait and mobility: Secondary | ICD-10-CM | POA: Diagnosis not present

## 2019-04-12 DIAGNOSIS — Z741 Need for assistance with personal care: Secondary | ICD-10-CM | POA: Diagnosis not present

## 2019-04-12 DIAGNOSIS — F0281 Dementia in other diseases classified elsewhere with behavioral disturbance: Secondary | ICD-10-CM | POA: Diagnosis not present

## 2019-04-13 DIAGNOSIS — R21 Rash and other nonspecific skin eruption: Secondary | ICD-10-CM | POA: Diagnosis not present

## 2019-04-13 DIAGNOSIS — M6281 Muscle weakness (generalized): Secondary | ICD-10-CM | POA: Diagnosis not present

## 2019-04-13 DIAGNOSIS — F0281 Dementia in other diseases classified elsewhere with behavioral disturbance: Secondary | ICD-10-CM | POA: Diagnosis not present

## 2019-04-13 DIAGNOSIS — Z741 Need for assistance with personal care: Secondary | ICD-10-CM | POA: Diagnosis not present

## 2019-04-13 DIAGNOSIS — R2689 Other abnormalities of gait and mobility: Secondary | ICD-10-CM | POA: Diagnosis not present

## 2019-04-14 DIAGNOSIS — M6281 Muscle weakness (generalized): Secondary | ICD-10-CM | POA: Diagnosis not present

## 2019-04-14 DIAGNOSIS — R2689 Other abnormalities of gait and mobility: Secondary | ICD-10-CM | POA: Diagnosis not present

## 2019-04-14 DIAGNOSIS — F0281 Dementia in other diseases classified elsewhere with behavioral disturbance: Secondary | ICD-10-CM | POA: Diagnosis not present

## 2019-04-14 DIAGNOSIS — Z741 Need for assistance with personal care: Secondary | ICD-10-CM | POA: Diagnosis not present

## 2019-04-15 ENCOUNTER — Encounter (HOSPITAL_COMMUNITY): Payer: Self-pay | Admitting: Emergency Medicine

## 2019-04-15 ENCOUNTER — Inpatient Hospital Stay (HOSPITAL_COMMUNITY)
Admission: EM | Admit: 2019-04-15 | Discharge: 2019-04-19 | DRG: 064 | Disposition: A | Discharge status: Hospice | Payer: Medicare Other | Source: Skilled Nursing Facility | Attending: Internal Medicine | Admitting: Internal Medicine

## 2019-04-15 ENCOUNTER — Inpatient Hospital Stay (HOSPITAL_COMMUNITY): Payer: Medicare Other

## 2019-04-15 ENCOUNTER — Emergency Department (HOSPITAL_COMMUNITY): Payer: Medicare Other

## 2019-04-15 ENCOUNTER — Other Ambulatory Visit: Payer: Self-pay

## 2019-04-15 DIAGNOSIS — I63411 Cerebral infarction due to embolism of right middle cerebral artery: Principal | ICD-10-CM

## 2019-04-15 DIAGNOSIS — Z7951 Long term (current) use of inhaled steroids: Secondary | ICD-10-CM

## 2019-04-15 DIAGNOSIS — I63521 Cerebral infarction due to unspecified occlusion or stenosis of right anterior cerebral artery: Secondary | ICD-10-CM | POA: Diagnosis not present

## 2019-04-15 DIAGNOSIS — R0681 Apnea, not elsewhere classified: Secondary | ICD-10-CM | POA: Diagnosis not present

## 2019-04-15 DIAGNOSIS — R29720 NIHSS score 20: Secondary | ICD-10-CM | POA: Diagnosis present

## 2019-04-15 DIAGNOSIS — E1122 Type 2 diabetes mellitus with diabetic chronic kidney disease: Secondary | ICD-10-CM | POA: Diagnosis present

## 2019-04-15 DIAGNOSIS — N184 Chronic kidney disease, stage 4 (severe): Secondary | ICD-10-CM

## 2019-04-15 DIAGNOSIS — R4701 Aphasia: Secondary | ICD-10-CM | POA: Diagnosis present

## 2019-04-15 DIAGNOSIS — Z515 Encounter for palliative care: Secondary | ICD-10-CM | POA: Diagnosis not present

## 2019-04-15 DIAGNOSIS — I509 Heart failure, unspecified: Secondary | ICD-10-CM | POA: Diagnosis not present

## 2019-04-15 DIAGNOSIS — H510 Palsy (spasm) of conjugate gaze: Secondary | ICD-10-CM | POA: Diagnosis present

## 2019-04-15 DIAGNOSIS — F411 Generalized anxiety disorder: Secondary | ICD-10-CM | POA: Diagnosis present

## 2019-04-15 DIAGNOSIS — E0829 Diabetes mellitus due to underlying condition with other diabetic kidney complication: Secondary | ICD-10-CM

## 2019-04-15 DIAGNOSIS — G311 Senile degeneration of brain, not elsewhere classified: Secondary | ICD-10-CM | POA: Diagnosis not present

## 2019-04-15 DIAGNOSIS — R4182 Altered mental status, unspecified: Secondary | ICD-10-CM | POA: Diagnosis not present

## 2019-04-15 DIAGNOSIS — I10 Essential (primary) hypertension: Secondary | ICD-10-CM | POA: Diagnosis not present

## 2019-04-15 DIAGNOSIS — R404 Transient alteration of awareness: Secondary | ICD-10-CM | POA: Diagnosis not present

## 2019-04-15 DIAGNOSIS — R0902 Hypoxemia: Secondary | ICD-10-CM | POA: Diagnosis not present

## 2019-04-15 DIAGNOSIS — E1149 Type 2 diabetes mellitus with other diabetic neurological complication: Secondary | ICD-10-CM | POA: Diagnosis present

## 2019-04-15 DIAGNOSIS — H9190 Unspecified hearing loss, unspecified ear: Secondary | ICD-10-CM | POA: Diagnosis present

## 2019-04-15 DIAGNOSIS — Z20828 Contact with and (suspected) exposure to other viral communicable diseases: Secondary | ICD-10-CM | POA: Diagnosis present

## 2019-04-15 DIAGNOSIS — J449 Chronic obstructive pulmonary disease, unspecified: Secondary | ICD-10-CM | POA: Diagnosis present

## 2019-04-15 DIAGNOSIS — R1310 Dysphagia, unspecified: Secondary | ICD-10-CM | POA: Diagnosis present

## 2019-04-15 DIAGNOSIS — R402362 Coma scale, best motor response, obeys commands, at arrival to emergency department: Secondary | ICD-10-CM | POA: Diagnosis present

## 2019-04-15 DIAGNOSIS — R4020 Unspecified coma: Secondary | ICD-10-CM | POA: Diagnosis not present

## 2019-04-15 DIAGNOSIS — G8194 Hemiplegia, unspecified affecting left nondominant side: Secondary | ICD-10-CM | POA: Diagnosis present

## 2019-04-15 DIAGNOSIS — E663 Overweight: Secondary | ICD-10-CM | POA: Diagnosis present

## 2019-04-15 DIAGNOSIS — I63511 Cerebral infarction due to unspecified occlusion or stenosis of right middle cerebral artery: Secondary | ICD-10-CM | POA: Diagnosis not present

## 2019-04-15 DIAGNOSIS — IMO0002 Reserved for concepts with insufficient information to code with codable children: Secondary | ICD-10-CM | POA: Diagnosis present

## 2019-04-15 DIAGNOSIS — G459 Transient cerebral ischemic attack, unspecified: Secondary | ICD-10-CM

## 2019-04-15 DIAGNOSIS — R29810 Facial weakness: Secondary | ICD-10-CM | POA: Diagnosis present

## 2019-04-15 DIAGNOSIS — L8990 Pressure ulcer of unspecified site, unspecified stage: Secondary | ICD-10-CM | POA: Insufficient documentation

## 2019-04-15 DIAGNOSIS — M2550 Pain in unspecified joint: Secondary | ICD-10-CM | POA: Diagnosis not present

## 2019-04-15 DIAGNOSIS — I639 Cerebral infarction, unspecified: Secondary | ICD-10-CM | POA: Diagnosis not present

## 2019-04-15 DIAGNOSIS — Z794 Long term (current) use of insulin: Secondary | ICD-10-CM

## 2019-04-15 DIAGNOSIS — Z87891 Personal history of nicotine dependence: Secondary | ICD-10-CM

## 2019-04-15 DIAGNOSIS — R402142 Coma scale, eyes open, spontaneous, at arrival to emergency department: Secondary | ICD-10-CM | POA: Diagnosis present

## 2019-04-15 DIAGNOSIS — I63233 Cerebral infarction due to unspecified occlusion or stenosis of bilateral carotid arteries: Secondary | ICD-10-CM | POA: Diagnosis not present

## 2019-04-15 DIAGNOSIS — I130 Hypertensive heart and chronic kidney disease with heart failure and stage 1 through stage 4 chronic kidney disease, or unspecified chronic kidney disease: Secondary | ICD-10-CM | POA: Diagnosis present

## 2019-04-15 DIAGNOSIS — I5023 Acute on chronic systolic (congestive) heart failure: Secondary | ICD-10-CM | POA: Diagnosis not present

## 2019-04-15 DIAGNOSIS — I619 Nontraumatic intracerebral hemorrhage, unspecified: Secondary | ICD-10-CM | POA: Diagnosis not present

## 2019-04-15 DIAGNOSIS — Z6827 Body mass index (BMI) 27.0-27.9, adult: Secondary | ICD-10-CM

## 2019-04-15 DIAGNOSIS — E785 Hyperlipidemia, unspecified: Secondary | ICD-10-CM | POA: Diagnosis present

## 2019-04-15 DIAGNOSIS — Z79899 Other long term (current) drug therapy: Secondary | ICD-10-CM

## 2019-04-15 DIAGNOSIS — Z66 Do not resuscitate: Secondary | ICD-10-CM | POA: Diagnosis present

## 2019-04-15 DIAGNOSIS — I6782 Cerebral ischemia: Secondary | ICD-10-CM | POA: Diagnosis not present

## 2019-04-15 DIAGNOSIS — I5042 Chronic combined systolic (congestive) and diastolic (congestive) heart failure: Secondary | ICD-10-CM | POA: Diagnosis present

## 2019-04-15 DIAGNOSIS — E0865 Diabetes mellitus due to underlying condition with hyperglycemia: Secondary | ICD-10-CM | POA: Diagnosis not present

## 2019-04-15 DIAGNOSIS — G40909 Epilepsy, unspecified, not intractable, without status epilepticus: Secondary | ICD-10-CM | POA: Diagnosis present

## 2019-04-15 DIAGNOSIS — R402212 Coma scale, best verbal response, none, at arrival to emergency department: Secondary | ICD-10-CM | POA: Diagnosis present

## 2019-04-15 DIAGNOSIS — F0390 Unspecified dementia without behavioral disturbance: Secondary | ICD-10-CM | POA: Diagnosis present

## 2019-04-15 DIAGNOSIS — R4189 Other symptoms and signs involving cognitive functions and awareness: Secondary | ICD-10-CM | POA: Diagnosis not present

## 2019-04-15 DIAGNOSIS — R569 Unspecified convulsions: Secondary | ICD-10-CM

## 2019-04-15 DIAGNOSIS — F329 Major depressive disorder, single episode, unspecified: Secondary | ICD-10-CM | POA: Diagnosis present

## 2019-04-15 DIAGNOSIS — I6300 Cerebral infarction due to thrombosis of unspecified precerebral artery: Secondary | ICD-10-CM

## 2019-04-15 DIAGNOSIS — Z7401 Bed confinement status: Secondary | ICD-10-CM | POA: Diagnosis not present

## 2019-04-15 LAB — CBC
HCT: 45.2 % (ref 39.0–52.0)
Hemoglobin: 14.3 g/dL (ref 13.0–17.0)
MCH: 28.3 pg (ref 26.0–34.0)
MCHC: 31.6 g/dL (ref 30.0–36.0)
MCV: 89.3 fL (ref 80.0–100.0)
Platelets: 256 10*3/uL (ref 150–400)
RBC: 5.06 MIL/uL (ref 4.22–5.81)
RDW: 15.8 % — ABNORMAL HIGH (ref 11.5–15.5)
WBC: 8.6 10*3/uL (ref 4.0–10.5)
nRBC: 0 % (ref 0.0–0.2)

## 2019-04-15 LAB — DIFFERENTIAL
Abs Immature Granulocytes: 0.09 10*3/uL — ABNORMAL HIGH (ref 0.00–0.07)
Basophils Absolute: 0.1 10*3/uL (ref 0.0–0.1)
Basophils Relative: 1 %
Eosinophils Absolute: 0 10*3/uL (ref 0.0–0.5)
Eosinophils Relative: 0 %
Immature Granulocytes: 1 %
Lymphocytes Relative: 10 %
Lymphs Abs: 0.9 10*3/uL (ref 0.7–4.0)
Monocytes Absolute: 0.4 10*3/uL (ref 0.1–1.0)
Monocytes Relative: 4 %
Neutro Abs: 7.2 10*3/uL (ref 1.7–7.7)
Neutrophils Relative %: 84 %

## 2019-04-15 LAB — MRSA PCR SCREENING: MRSA by PCR: NEGATIVE

## 2019-04-15 LAB — URINALYSIS, ROUTINE W REFLEX MICROSCOPIC
Bilirubin Urine: NEGATIVE
Glucose, UA: 50 mg/dL — AB
Ketones, ur: 20 mg/dL — AB
Nitrite: NEGATIVE
Protein, ur: 100 mg/dL — AB
Specific Gravity, Urine: 1.044 — ABNORMAL HIGH (ref 1.005–1.030)
pH: 5 (ref 5.0–8.0)

## 2019-04-15 LAB — COMPREHENSIVE METABOLIC PANEL
ALT: 19 U/L (ref 0–44)
AST: 22 U/L (ref 15–41)
Albumin: 3.8 g/dL (ref 3.5–5.0)
Alkaline Phosphatase: 59 U/L (ref 38–126)
Anion gap: 13 (ref 5–15)
BUN: 30 mg/dL — ABNORMAL HIGH (ref 8–23)
CO2: 22 mmol/L (ref 22–32)
Calcium: 9.5 mg/dL (ref 8.9–10.3)
Chloride: 104 mmol/L (ref 98–111)
Creatinine, Ser: 2.18 mg/dL — ABNORMAL HIGH (ref 0.61–1.24)
GFR calc Af Amer: 33 mL/min — ABNORMAL LOW (ref >60)
GFR calc non Af Amer: 29 mL/min — ABNORMAL LOW (ref >60)
Glucose, Bld: 189 mg/dL — ABNORMAL HIGH (ref 70–99)
Potassium: 5.1 mmol/L (ref 3.5–5.1)
Sodium: 139 mmol/L (ref 135–145)
Total Bilirubin: 1.1 mg/dL (ref 0.3–1.2)
Total Protein: 6.8 g/dL (ref 6.5–8.1)

## 2019-04-15 LAB — I-STAT CHEM 8, ED
BUN: 30 mg/dL — ABNORMAL HIGH (ref 8–23)
Calcium, Ion: 1.1 mmol/L — ABNORMAL LOW (ref 1.15–1.40)
Chloride: 105 mmol/L (ref 98–111)
Creatinine, Ser: 2 mg/dL — ABNORMAL HIGH (ref 0.61–1.24)
Glucose, Bld: 183 mg/dL — ABNORMAL HIGH (ref 70–99)
HCT: 45 % (ref 39.0–52.0)
Hemoglobin: 15.3 g/dL (ref 13.0–17.0)
Potassium: 4.7 mmol/L (ref 3.5–5.1)
Sodium: 139 mmol/L (ref 135–145)
TCO2: 23 mmol/L (ref 22–32)

## 2019-04-15 LAB — CBG MONITORING, ED
Glucose-Capillary: 179 mg/dL — ABNORMAL HIGH (ref 70–99)
Glucose-Capillary: 178 mg/dL — ABNORMAL HIGH (ref 70–99)

## 2019-04-15 LAB — GLUCOSE, CAPILLARY
Glucose-Capillary: 192 mg/dL — ABNORMAL HIGH (ref 70–99)
Glucose-Capillary: 160 mg/dL — ABNORMAL HIGH (ref 70–99)

## 2019-04-15 LAB — SARS CORONAVIRUS 2 BY RT PCR (HOSPITAL ORDER, PERFORMED IN ~~LOC~~ HOSPITAL LAB): SARS Coronavirus 2: NEGATIVE

## 2019-04-15 LAB — RAPID URINE DRUG SCREEN, HOSP PERFORMED
Amphetamines: NOT DETECTED
Barbiturates: NOT DETECTED
Benzodiazepines: NOT DETECTED
Cocaine: NOT DETECTED
Opiates: NOT DETECTED
Tetrahydrocannabinol: NOT DETECTED

## 2019-04-15 LAB — ETHANOL: Alcohol, Ethyl (B): <10 mg/dL (ref <10)

## 2019-04-15 LAB — PROTIME-INR
INR: 1.3 — ABNORMAL HIGH (ref 0.8–1.2)
Prothrombin Time: 15.8 seconds — ABNORMAL HIGH (ref 11.4–15.2)

## 2019-04-15 LAB — APTT: aPTT: 34 seconds (ref 24–36)

## 2019-04-15 LAB — ECHOCARDIOGRAM COMPLETE
Height: 72 in
Weight: 3200 oz

## 2019-04-15 MED ORDER — STROKE: EARLY STAGES OF RECOVERY BOOK
Freq: Once | Status: AC
  Administered 2019-04-15: 15:00:00
  Filled 2019-04-15: qty 1

## 2019-04-15 MED ORDER — ASPIRIN 300 MG RE SUPP
300.0000 mg | Freq: Every day | RECTAL | Status: DC
  Administered 2019-04-15 – 2019-04-18 (×4): 300 mg via RECTAL
  Filled 2019-04-15 (×4): qty 1

## 2019-04-15 MED ORDER — SODIUM CHLORIDE 0.9 % IV SOLN
INTRAVENOUS | Status: DC
  Administered 2019-04-15: 14:00:00 via INTRAVENOUS

## 2019-04-15 MED ORDER — ASPIRIN 325 MG PO TABS: 325.0000 mg | ORAL_TABLET | Freq: Every day | ORAL | Status: DC

## 2019-04-15 MED ORDER — HALOPERIDOL LACTATE 5 MG/ML IJ SOLN: 0.5000 mg | Freq: Four times a day (QID) | INTRAMUSCULAR | Status: DC | PRN

## 2019-04-15 MED ORDER — ACETAMINOPHEN 650 MG RE SUPP
650.0000 mg | RECTAL | Status: DC | PRN
  Administered 2019-04-17 (×2): 650 mg via RECTAL
  Filled 2019-04-15 (×2): qty 1

## 2019-04-15 MED ORDER — ACETAMINOPHEN 325 MG PO TABS: 650.0000 mg | ORAL_TABLET | ORAL | Status: DC | PRN

## 2019-04-15 MED ORDER — VALPROATE SODIUM 500 MG/5ML IV SOLN
500.0000 mg | Freq: Two times a day (BID) | INTRAVENOUS | Status: DC
  Administered 2019-04-15 – 2019-04-19 (×8): 500 mg via INTRAVENOUS
  Filled 2019-04-15 (×9): qty 5

## 2019-04-15 MED ORDER — VALPROATE SODIUM 500 MG/5ML IV SOLN
1000.0000 mg | Freq: Every day | INTRAVENOUS | Status: DC
  Administered 2019-04-15 – 2019-04-18 (×4): 1000 mg via INTRAVENOUS
  Filled 2019-04-15 (×5): qty 10

## 2019-04-15 MED ORDER — INSULIN ASPART 100 UNIT/ML ~~LOC~~ SOLN: 0.0000 [IU] | Freq: Every day | SUBCUTANEOUS | Status: DC

## 2019-04-15 MED ORDER — INSULIN ASPART 100 UNIT/ML ~~LOC~~ SOLN
0.0000 [IU] | Freq: Three times a day (TID) | SUBCUTANEOUS | Status: DC
  Administered 2019-04-15: 3 [IU] via SUBCUTANEOUS
  Administered 2019-04-16: 09:00:00 4 [IU] via SUBCUTANEOUS
  Administered 2019-04-16: 13:00:00 2 [IU] via SUBCUTANEOUS
  Administered 2019-04-16 – 2019-04-17 (×3): 3 [IU] via SUBCUTANEOUS
  Administered 2019-04-17: 2 [IU] via SUBCUTANEOUS
  Administered 2019-04-18 (×2): 3 [IU] via SUBCUTANEOUS

## 2019-04-15 MED ORDER — ENOXAPARIN SODIUM 40 MG/0.4ML ~~LOC~~ SOLN
40.0000 mg | SUBCUTANEOUS | Status: DC
  Administered 2019-04-15 – 2019-04-18 (×4): 40 mg via SUBCUTANEOUS
  Filled 2019-04-15 (×4): qty 0.4

## 2019-04-15 MED ORDER — ACETAMINOPHEN 160 MG/5ML PO SOLN: 650.0000 mg | ORAL | Status: DC | PRN

## 2019-04-15 MED ORDER — IOHEXOL 350 MG/ML SOLN
100.0000 mL | Freq: Once | INTRAVENOUS | Status: AC | PRN
  Administered 2019-04-15: 100 mL via INTRAVENOUS

## 2019-04-15 MED ORDER — SENNOSIDES-DOCUSATE SODIUM 8.6-50 MG PO TABS: 1.0000 | ORAL_TABLET | Freq: Every evening | ORAL | Status: DC | PRN

## 2019-04-15 MED ORDER — SODIUM CHLORIDE 0.9 % IV BOLUS
1000.0000 mL | Freq: Once | INTRAVENOUS | Status: AC
  Administered 2019-04-15: 1000 mL via INTRAVENOUS

## 2019-04-15 NOTE — Consult Note (Addendum)
Neurology Consultation  Reason for Consult: Code Stroke Referring Physician: Dr. Sedonia Small  CC: R facial droop, L sided weakness  History is obtained from: bedside nurse, notes from the facility, EMR  HPI: MARKHAM DUMLAO is a 73 y.o. male with history of dementia, seizures, DM type 2, HTN, HLD, CKD stg 3, and hx of etoh abuse presents from [facility] via EMS for AMS. Patient's LKN was at the latest 2300 last night, per facility nurse/staff was noted to have R facial droop, LHB plegia, and not following commands this morning.  On assessment, patient was is non-verbal, unable to follow commands, severely paretic on the L hemibody, and spontaneously anti-gravity on R hemibody. He also has R facial droop with R side gaze preference, unable to cross midline.  Multiple attempts to reach family members listed on the chart were unsuccessful  ED course CT head significant for large R MCA infarct. CTAP with 179cc penumbra, mismatch 47cc.   Chart review: Managed by [facility] outpatient for seizures, takes Depakote DR 500 mg TID and Keppra 250 mg BID. Cr 2.3-2.6 at baseline. Gait disturbance at baseline, uses wheelchair to mobilize, can stand but unable to effectively walk.  LKN: 04/14/2019 2300 tpa given?: no, outside of window Premorbid modified Rankin scale (mRS): 4-Needs assistance to walk and tend to bodily needs  ROS: Unable to obtain due to altered mental status.   Past Medical History:  Diagnosis Date  . Abnormality of gait 01/14/2016  . Alcohol abuse 2009   dependency as exhibited by ETOH withdrawal seizure.   . Alcohol withdrawal seizure ([place]) 2013  . Anemia 2011   Normocytic.   . Cataract   . Chronic renal insufficiency, stage III (moderate) ([place]) 02/26/2014  . Depression with anxiety   . Hearing impairment   . HTN (hypertension), benign 02/26/2014  . Hyperlipemia   . Seizures ([place])   . Type II or unspecified type diabetes mellitus with neurological manifestations,  not stated as uncontrolled(250.60) 02/26/2014    Family History  Family history unknown: Yes   Social History:   reports that he has quit smoking. His smoking use included cigars. His smokeless tobacco use includes chew. He reports that he does not drink alcohol or use drugs.  Medications No current facility-administered medications for this encounter.   Current Outpatient Medications:  .  busPIRone (BUSPAR) 5 MG tablet, Take 2.5 mg by mouth 2 (two) times daily. , Disp: , Rfl:  .  divalproex (DEPAKOTE) 500 MG DR tablet, Take 500-1,000 mg by mouth See admin instructions. Give 500 mg at 0900 and 1700  Give 1000 mg at bedtime, Disp: , Rfl:  .  Ensure (ENSURE), Take 120 mLs by mouth 3 (three) times daily between meals., Disp: , Rfl:  .  folic acid (FOLVITE) 1 MG tablet, Take 1 tablet (1 mg total) by mouth daily., Disp: 30 tablet, Rfl: 1 .  insulin aspart (NOVOLOG) 100 UNIT/ML injection, Inject 0-10 Units into the skin 3 (three) times daily before meals. Call MD if CBG >400.  Takes at [place], 1130, 1630 1-179 = 0 units 180-400 = 10 units, Disp: , Rfl:  .  Insulin Glargine (TOUJEO MAX SOLOSTAR) 300 UNIT/ML SOPN, Inject 0-165 Units into the skin See admin instructions. Per Sliding Scale once daily 0-180 give 0 units 181-450 give 165 units, Disp: , Rfl:  .  liraglutide (VICTOZA) 18 MG/3ML SOPN, Inject 1.2 mg into the skin daily., Disp: , Rfl:  .  LOKELMA 10 g PACK packet, Take 10  g by mouth daily., Disp: , Rfl:  .  Multiple Vitamin (MULTIVITAMIN WITH MINERALS) TABS tablet, Take 1 tablet by mouth daily., Disp: 30 tablet, Rfl: 0 .  PULMICORT FLEXHALER 90 MCG/ACT inhaler, Inhale 1 puff into the lungs 2 (two) times a day., Disp: , Rfl:  .  QUEtiapine (SEROQUEL) 100 MG tablet, Take 100 mg by mouth 2 (two) times daily. , Disp: , Rfl:  .  sertraline (ZOLOFT) 100 MG tablet, Take 100 mg by mouth at bedtime., Disp: , Rfl:  .  budesonide (PULMICORT) 0.25 MG/2ML nebulizer solution, Take 2 mLs (0.25 mg total) by  nebulization 2 (two) times daily. (Patient not taking: Reported on 04/15/2019), Disp: 60 mL, Rfl: 12 .  cetaphil (CETAPHIL) lotion, Apply topically 2 (two) times daily. (Patient not taking: Reported on 04/15/2019), Disp: 236 mL, Rfl: 0 .  hydrOXYzine (ATARAX/VISTARIL) 10 MG tablet, Take 1 tablet (10 mg total) by mouth 3 (three) times daily as needed for itching. (Patient not taking: Reported on 04/15/2019), Disp: 30 tablet, Rfl: 0 .  ipratropium-albuterol (DUONEB) 0.5-2.5 (3) MG/3ML SOLN, Take 3 mLs by nebulization every 6 (six) hours as needed. (Patient not taking: Reported on 04/15/2019), Disp: 360 mL, Rfl: 0 .  levETIRAcetam (KEPPRA) 250 MG tablet, Take 1 tablet (250 mg total) by mouth 2 (two) times daily. (Patient not taking: Reported on 04/15/2019), Disp: 60 tablet, Rfl: 0 .  ondansetron (ZOFRAN) 4 MG tablet, Take 1 tablet (4 mg total) by mouth every 6 (six) hours as needed for nausea. (Patient not taking: Reported on 04/15/2019), Disp: 20 tablet, Rfl: 0 .  predniSONE (DELTASONE) 10 MG tablet, Take 40 mg for 1 Day, 30 mg for 2 days, 20 mg for 2 Days, and 10 mg x2 Days and then stop. (Patient not taking: Reported on 04/15/2019), Disp: 14 tablet, Rfl: 0 .  senna-docusate (SENOKOT-S) 8.6-50 MG tablet, Take 1 tablet by mouth at bedtime as needed for mild constipation. (Patient not taking: Reported on 04/15/2019), Disp: 30 tablet, Rfl: 0  Exam: Current vital signs: BP (!) 174/111 (BP Location: Right Arm)   Pulse 99   Temp 99.4 F (37.4 C) (Oral)   Resp 17   Ht 6' (1.829 m)   Wt 90.7 kg   SpO2 100%   BMI 27.12 kg/m  Vital signs in last 24 hours: Temp:  [99.4 F (37.4 C)] 99.4 F (37.4 C) (07/17 0936) Pulse Rate:  [99] 99 (07/17 0936) Resp:  [17] 17 (07/17 0936) BP: (174)/(111) 174/111 (07/17 0936) SpO2:  [93 %-100 %] 100 % (07/17 0936) Weight:  [90.7 kg] 90.7 kg (07/17 0938)  Physical Exam  Constitutional: Appears well-developed and well-nourished.  Eyes: No scleral injection HENT: No OP  obstrucion Head: Normocephalic. Atraumatic. Cardiovascular: RRR Respiratory: Effort normal, non-labored breathing  Skin: WDI  Neuro: Mental Status: Patient's eyes are open but appears sleepy. Non-verbal. Unable to follow commands. Cranial Nerves: R gaze preference, does not cross midline, does not track. Pupils equal, round, reactive.  L facial droop. Does not blink to threat from the left, inconsistent blinking to threat from the right  Motor: RHB spontaneously anti-gravity. LHB plegic, mild withdrawal of LLE to noxious stimuli.  Sensory: As above Unable to assess coordination  NIHSS 1a Level of Conscious.: 1 1b LOC Questions: 2 1c LOC Commands: 2 2 Best Gaze: 1 3 Visual: 1 4 Facial Palsy: 1 5a Motor Arm - left: 3 5b Motor Arm - Right: 0 6a Motor Leg - Left: 3 6b Motor Leg - Right: 0  7 Limb Ataxia: 0 8 Sensory: 1 9 Best Language: 3 10 Dysarthria: 2 11 Extinct. and Inatten.:  0 TOTAL: 20   Labs I have reviewed labs in epic and the results pertinent to this consultation are:  CBC    Component Value Date/Time   WBC 8.6 04/15/2019 0952   RBC 5.06 04/15/2019 0952   HGB 15.3 04/15/2019 1010   HGB 13.2 12/31/2017 0806   HCT 45.0 04/15/2019 1010   HCT 40.7 12/31/2017 0806   PLT 256 04/15/2019 0952   PLT 182 12/31/2017 0806   MCV 89.3 04/15/2019 0952   MCV 95 12/31/2017 0806   MCH 28.3 04/15/2019 0952   MCHC 31.6 04/15/2019 0952   RDW 15.8 (H) 04/15/2019 0952   RDW 14.5 12/31/2017 0806   LYMPHSABS 0.9 04/15/2019 0952   LYMPHSABS 1.9 12/31/2017 0806   MONOABS 0.4 04/15/2019 0952   EOSABS 0.0 04/15/2019 0952   EOSABS 0.2 12/31/2017 0806   BASOSABS 0.1 04/15/2019 0952   BASOSABS 0.0 12/31/2017 0806   CMP     Component Value Date/Time   NA 139 04/15/2019 1010   NA 135 (A) 01/05/2018   K 4.7 04/15/2019 1010   CL 105 04/15/2019 1010   CO2 23 12/24/2018 1547   GLUCOSE 183 (H) 04/15/2019 1010   BUN 30 (H) 04/15/2019 1010   BUN 31 (A) 01/05/2018    CREATININE 2.00 (H) 04/15/2019 1010   CREATININE 1.08 01/11/2014 1531   CALCIUM 9.0 12/24/2018 1547   PROT 5.6 (L) 12/24/2018 0333   PROT 6.9 12/31/2017 0806   ALBUMIN 3.0 (L) 12/24/2018 0333   ALBUMIN 4.2 12/31/2017 0806   AST 11 (L) 12/24/2018 0333   ALT 16 12/24/2018 0333   ALKPHOS 51 12/24/2018 0333   BILITOT 0.5 12/24/2018 0333   BILITOT 0.3 12/31/2017 0806   GFRNONAA 31 (L) 12/24/2018 1547   GFRNONAA 69 01/11/2014 1531   GFRAA 36 (L) 12/24/2018 1547   GFRAA 80 01/11/2014 1531   Lipid Panel     Component Value Date/Time   CHOL 186 09/17/2017   TRIG 382 (A) 09/17/2017   HDL 32 (A) 09/17/2017   CHOLHDL 2.2 05/23/2013 1413   VLDL 20 05/23/2013 1413   LDLCALC 78 09/17/2017   Imaging I have reviewed the images obtained:  CT-scan of the brain- Large R MCA territory infarct, R M1 occlusion.  CT A/P- 179cc of penumbra, cbf volume 132 cc, mismatch 47cc    Thank you for the consult for allowing [place] to care for this patient.   Posey Pronto PA-C [facility] [phone]   Attending addendum Patient seen and examined is an acute code stroke History obtained from charts of the facility and our electronic medical record Multiple attempts to reach family numbers listed in the chart were not successful. Patient had a presentation consistent with a right MCA syndrome. Noncontrast CT of the head showed a large right MCA territory stroke, with aspects of 2-3 No bleeding on the initial CT scan that was done in the emergency room CTA head and neck-technical difficulties due to migration of PACs but review on the scanner showed right M1 occlusion.  CT perfusion study with a large 132 cc core and a 179 cc penumbra-not amenable to intervention Outside the window for IV TPA by history  I have independently reviewed imaging-CT head with large right MCA territory stroke.  CTA and perfusion as above-pasted photo.   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  Outside the window for IV TPA Too large of an infarct to be amenable to intervention  Recommendations:  Completed R MCA territory infarct + R M1 occlusion:  - Admit for stroke work up  - ASA 325 mg QD,  - TTE w/bubble study - MRI brain  - PT/OT/SLP - neurochecks  -I would recommend having goals of care conversation with the family given this large stroke which will definitely leave him extremely disabled, on top of his already existing very poor baseline. Discussed my plan with the hospitalist-Dr. Lorin Mercy.  Stroke team will continue to follow with you as needed.  -- Amie Portland, MD [facility] Pager: [phone] If 7pm to 7am, please call on call as listed on AMION.  CRITICAL CARE ATTESTATION Performed by: Amie Portland, MD Total critical care time: 45 minutes Critical care time was exclusive of separately billable procedures and treating other patients and/or supervising APPs/Residents/Students Critical care was necessary to treat or prevent imminent or life-threatening deterioration due to large right MCA territory stroke. This patient is critically ill and at significant risk for neurological worsening and/or death and care requires constant monitoring. Critical care was time spent personally by me on the following activities: development of treatment plan with patient and/or surrogate as well as nursing, discussions with consultants, evaluation of patient's response to treatment, examination of patient, obtaining history from patient or surrogate, ordering and performing treatments and interventions, ordering and review of laboratory studies, ordering and review of radiographic studies, pulse oximetry, re-evaluation of patient's condition, participation in multidisciplinary  rounds and medical decision making of high complexity in the care of this patient.

## 2019-04-15 NOTE — Progress Notes (Signed)
Initial Nutrition Assessment  DOCUMENTATION CODES:   Not applicable  INTERVENTION:  RD to order nutritional supplements once diet advances as appropriate.   NUTRITION DIAGNOSIS:   Inadequate oral intake related to inability to eat as evidenced by NPO status.  GOAL:   Patient will meet greater than or equal to 90% of their needs  MONITOR:   Diet advancement, Skin, Weight trends, Labs, I & O's  REASON FOR ASSESSMENT:   Consult (stroke)  ASSESSMENT:   73 y.o. male with medical history significant of dementia; DM; seizure d/o; HTN; HLD; heating impairment; depression/anxiety; ETOH dependence; and stage 3 CKD presenting with AMS. L-sided weakness, facial droop.  Large R-sided CVA  Pt unavailable during attempted time of contact. RD unable to obtain pt nutrition history. Pt currently NPO. Diet advancement pending swallow evaluation. RD to order nutritional supplements once diet advances as appropriate to aid in caloric and protein needs.   Unable to complete Nutrition-Focused physical exam at this time.   Labs and medications reviewed.   Diet Order:   Diet Order            Diet NPO time specified  Diet effective now              EDUCATION NEEDS:   Not appropriate for education at this time  Skin:  Skin Assessment: Reviewed RN Assessment  Last BM:  Unknown  Height:   Ht Readings from Last 1 Encounters:  04/15/19 6' (1.829 m)    Weight:   Wt Readings from Last 1 Encounters:  04/15/19 90.7 kg    Ideal Body Weight:  80.9 kg  BMI:  Body mass index is 27.12 kg/m.  Estimated Nutritional Needs:   Kcal:  2000-2200  Protein:  90-105 grams  Fluid:  >/= 2 L/day    Corrin Parker, MS, RD, LDN Pager # [phone] After hours/ weekend pager # [phone]

## 2019-04-15 NOTE — Progress Notes (Signed)
  Echocardiogram 2D Echocardiogram has been performed.  William [name] 04/15/2019, 4:00 PM

## 2019-04-15 NOTE — ED Notes (Addendum)
ED TO INPATIENT HANDOFF REPORT  ED Nurse Name and Phone #: Thurmond Butts [place] Name/Age/Gender William [name] 73 y.o. male Room/Bed: 017C/017C  Code Status   Code Status: DNR  Home/SNF/Other Skilled nursing facility  Is this baseline?   Unknown pt baseline, facility contacted but was unable to speak to anyone.  Triage Complete: Triage complete  Chief Complaint Stroke Symptoms  Triage Note Pt BIB [facility] from [facility]. Per EMS pt altered mental status. Facility stated that pt was normal 2300 last night and pt altered this morning sometime. LKW 2300. Pt presents with right sided facial droop. Per EMS not moving left arm and not opening eye and not responding to commands. Upon arrival to room, pt alert, moving all extremities and nods to answer questions. Pt with history of dementia and seizures. VSS.   Allergies No Known Allergies  Level of Care/Admitting Diagnosis ED Disposition    ED Disposition Condition [facility] Area: [place] [[id number]]  Level of Care: Progressive [102]  Covid Evaluation: Asymptomatic Screening Protocol (No Symptoms)  Diagnosis: CVA (cerebral vascular accident) [facility]) [[id number]]  Admitting Physician: Karmen Bongo [[id number]]  Attending Physician: Karmen Bongo [[id number]]  Estimated length of stay: 3 - 4 days  Certification:: I certify this patient will need inpatient services for at least 2 midnights  PT Class (Do Not Modify): Inpatient [101]  PT Acc Code (Do Not Modify): Private [1]       B Medical/Surgery History Past Medical History:  Diagnosis Date  . Abnormality of gait 01/14/2016  . Alcohol abuse 2009   dependency as exhibited by ETOH withdrawal seizure.   . Alcohol withdrawal seizure ([place]) 2013  . Anemia 2011   Normocytic.   . Cataract   . Chronic renal insufficiency, stage III (moderate) ([place]) 02/26/2014  . Depression with anxiety   . Hearing impairment   . HTN (hypertension), benign 02/26/2014   . Hyperlipemia   . Seizures ([place])   . Type II or unspecified type diabetes mellitus with neurological manifestations, not stated as uncontrolled(250.60) 02/26/2014   Past Surgical History:  Procedure Laterality Date  . NO PAST SURGERIES       A IV Location/Drains/Wounds Patient Lines/Drains/Airways Status   Active Line/Drains/Airways    Name:   Placement date:   Placement time:   Site:   Days:   Peripheral IV 04/15/19 Right Antecubital   04/15/19    1000    Antecubital   less than 1   Peripheral IV 04/15/19 Left Antecubital   04/15/19    1025    Antecubital   less than 1          Intake/Output Last 24 hours No intake or output data in the 24 hours ending 04/15/19 1217  Labs/Imaging Results for orders placed or performed during the hospital encounter of 04/15/19 (from the past 48 hour(s))  CBG monitoring, ED     Status: Abnormal   Collection Time: 04/15/19  9:28 AM  Result Value Ref Range   Glucose-Capillary 179 (H) 70 - 99 mg/dL   Comment 1 Notify RN    Comment 2 Document in Chart   Ethanol     Status: None   Collection Time: 04/15/19  9:52 AM  Result Value Ref Range   Alcohol, Ethyl (B) <10 <10 mg/dL    Comment: (NOTE) Lowest detectable limit for serum alcohol is 10 mg/dL. For medical purposes only. Performed at [facility], [place],  [place] [address]   Protime-INR     Status: Abnormal   Collection Time: 04/15/19  9:52 AM  Result Value Ref Range   Prothrombin Time 15.8 (H) 11.4 - 15.2 seconds   INR 1.3 (H) 0.8 - 1.2    Comment: (NOTE) INR goal varies based on device and disease states. Performed at [facility], [place] [address], [place], [place] [address]   APTT     Status: None   Collection Time: 04/15/19  9:52 AM  Result Value Ref Range   aPTT 34 24 - 36 seconds    Comment: Performed at [facility] [address] [place], [place] [address]  CBC     Status: Abnormal   Collection Time: 04/15/19  9:52 AM  Result Value  Ref Range   WBC 8.6 4.0 - 10.5 K/uL   RBC 5.06 4.22 - 5.81 MIL/uL   Hemoglobin 14.3 13.0 - 17.0 g/dL   HCT 45.2 39.0 - 52.0 %   MCV 89.3 80.0 - 100.0 fL   MCH 28.3 26.0 - 34.0 pg   MCHC 31.6 30.0 - 36.0 g/dL   RDW 15.8 (H) 11.5 - 15.5 %   Platelets 256 150 - 400 K/uL   nRBC 0.0 0.0 - 0.2 %    Comment: Performed at [facility], [place] [address] [place], [place] [address]  Differential     Status: Abnormal   Collection Time: 04/15/19  9:52 AM  Result Value Ref Range   Neutrophils Relative % 84 %   Neutro Abs 7.2 1.7 - 7.7 K/uL   Lymphocytes Relative 10 %   Lymphs Abs 0.9 0.7 - 4.0 K/uL   Monocytes Relative 4 %   Monocytes Absolute 0.4 0.1 - 1.0 K/uL   Eosinophils Relative 0 %   Eosinophils Absolute 0.0 0.0 - 0.5 K/uL   Basophils Relative 1 %   Basophils Absolute 0.1 0.0 - 0.1 K/uL   Immature Granulocytes 1 %   Abs Immature Granulocytes 0.09 (H) 0.00 - 0.07 K/uL    Comment: Performed at [facility] [address], [place], [place] [address]  Comprehensive metabolic panel     Status: Abnormal   Collection Time: 04/15/19  9:52 AM  Result Value Ref Range   Sodium 139 135 - 145 mmol/L   Potassium 5.1 3.5 - 5.1 mmol/L   Chloride 104 98 - 111 mmol/L   CO2 22 22 - 32 mmol/L   Glucose, Bld 189 (H) 70 - 99 mg/dL   BUN 30 (H) 8 - 23 mg/dL   Creatinine, Ser 2.18 (H) 0.61 - 1.24 mg/dL   Calcium 9.5 8.9 - 10.3 mg/dL   Total Protein 6.8 6.5 - 8.1 g/dL   Albumin 3.8 3.5 - 5.0 g/dL   AST 22 15 - 41 U/L   ALT 19 0 - 44 U/L   Alkaline Phosphatase 59 38 - 126 U/L   Total Bilirubin 1.1 0.3 - 1.2 mg/dL   GFR calc non Af Amer 29 (L) >60 mL/min   GFR calc Af Amer 33 (L) >60 mL/min   Anion gap 13 5 - 15    Comment: Performed at [facility] [address] [place], [place] [address]  I-stat chem 8, ED     Status: Abnormal   Collection Time: 04/15/19 10:10 AM  Result Value Ref Range   Sodium 139 135 - 145 mmol/L   Potassium 4.7 3.5 - 5.1 mmol/L   Chloride 105 98 -  111  mmol/L   BUN 30 (H) 8 - 23 mg/dL   Creatinine, Ser 2.00 (H) 0.61 - 1.24 mg/dL   Glucose, Bld 183 (H) 70 - 99 mg/dL   Calcium, Ion 1.10 (L) 1.15 - 1.40 mmol/L   TCO2 23 22 - 32 mmol/L   Hemoglobin 15.3 13.0 - 17.0 g/dL   HCT 45.0 39.0 - 52.0 %   Ct Code Stroke Cta Head W/wo Contrast  Result Date: 04/15/2019 CLINICAL DATA:  Left-sided weakness EXAM: CT ANGIOGRAPHY HEAD AND NECK CT PERFUSION BRAIN TECHNIQUE: Multidetector CT imaging of the head and neck was performed using the standard protocol during bolus administration of intravenous contrast. Multiplanar CT image reconstructions and MIPs were obtained to evaluate the vascular anatomy. Carotid stenosis measurements (when applicable) are obtained utilizing NASCET criteria, using the distal internal carotid diameter as the denominator. Multiphase CT imaging of the brain was performed following IV bolus contrast injection. Subsequent parametric perfusion maps were calculated using RAPID software. CONTRAST:  Dose is currently not available COMPARISON:  None. FINDINGS: The scanner has a malfunction such that images are being reviewed on TeraRecon rather than in pacs CTA NECK FINDINGS Aortic arch: No acute finding Right carotid system: Moderate atherosclerotic plaque mainly at the bifurcation. No ulceration or flow limiting stenosis. Left carotid system: Atherosclerotic plaque to a moderate degree, mainly at the bifurcation. No flow limiting stenosis or ulceration. Vertebral arteries: No proximal subclavian stenosis or ulceration. Moderate narrowing at the right vertebral origin due to atherosclerosis. Skeleton: Cervical spine degeneration with bulky ridging. Other neck: No acute finding Upper chest: Patulous esophagus. Patchy air trapping in the apical lungs. Review of the MIP images confirms the above findings CTA HEAD FINDINGS Anterior circulation: Right M1 occlusion, expected based on prior head CT. The right A1 is patent. No additional embolism is seen.  Poor collaterals at the arterial phase. Posterior circulation: Widely patent vertebral arteries. Mild-to-moderate atheromatous irregularity of the basilar. Extensive atherosclerotic narrowing of the bilateral posterior cerebral arteries with high-grade proximal stenoses. Venous sinuses: Not well assessed on this arterial study Anatomic variants: None significant Delayed phase: Not obtained Review of the MIP images confirms the above findings CT Brain Perfusion Findings: ASPECTS: 3 CBF (<30%) Volume: 144mL Perfusion (Tmax>6.0s) volume: 119mL Mismatch Volume: 58mL Infarction Location:Right MCA territory IMPRESSION: 1. 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 2. No flow limiting stenosis or embolic source seen in the right ICA. 3. Advanced atherosclerosis of the bilateral posterior cerebral arteries. There are poor collaterals along the cerebral convexities. Electronically Signed   By: Monte Fantasia M.D.   On: 04/15/2019 11:13   Ct Code Stroke Cta Neck W/wo Contrast  Result Date: 04/15/2019 CLINICAL DATA:  Left-sided weakness EXAM: CT ANGIOGRAPHY HEAD AND NECK CT PERFUSION BRAIN TECHNIQUE: Multidetector CT imaging of the head and neck was performed using the standard protocol during bolus administration of intravenous contrast. Multiplanar CT image reconstructions and MIPs were obtained to evaluate the vascular anatomy. Carotid stenosis measurements (when applicable) are obtained utilizing NASCET criteria, using the distal internal carotid diameter as the denominator. Multiphase CT imaging of the brain was performed following IV bolus contrast injection. Subsequent parametric perfusion maps were calculated using RAPID software. CONTRAST:  Dose is currently not available COMPARISON:  None. FINDINGS: The scanner has a malfunction such that images are being reviewed on TeraRecon rather than in pacs CTA NECK FINDINGS Aortic arch: No  acute finding Right carotid system:  Moderate atherosclerotic plaque mainly at the bifurcation. No ulceration or flow limiting stenosis. Left carotid system: Atherosclerotic plaque to a moderate degree, mainly at the bifurcation. No flow limiting stenosis or ulceration. Vertebral arteries: No proximal subclavian stenosis or ulceration. Moderate narrowing at the right vertebral origin due to atherosclerosis. Skeleton: Cervical spine degeneration with bulky ridging. Other neck: No acute finding Upper chest: Patulous esophagus. Patchy air trapping in the apical lungs. Review of the MIP images confirms the above findings CTA HEAD FINDINGS Anterior circulation: Right M1 occlusion, expected based on prior head CT. The right A1 is patent. No additional embolism is seen. Poor collaterals at the arterial phase. Posterior circulation: Widely patent vertebral arteries. Mild-to-moderate atheromatous irregularity of the basilar. Extensive atherosclerotic narrowing of the bilateral posterior cerebral arteries with high-grade proximal stenoses. Venous sinuses: Not well assessed on this arterial study Anatomic variants: None significant Delayed phase: Not obtained Review of the MIP images confirms the above findings CT Brain Perfusion Findings: ASPECTS: 3 CBF (<30%) Volume: 156mL Perfusion (Tmax>6.0s) volume: 171mL Mismatch Volume: 94mL Infarction Location:Right MCA territory IMPRESSION: 1. 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 2. No flow limiting stenosis or embolic source seen in the right ICA. 3. Advanced atherosclerosis of the bilateral posterior cerebral arteries. There are poor collaterals along the cerebral convexities. Electronically Signed   By: Monte Fantasia M.D.   On: 04/15/2019 11:13   Ct Code Stroke Cta Cerebral Perfusion W/wo Contrast  Result Date: 04/15/2019 CLINICAL DATA:  Left-sided weakness EXAM: CT ANGIOGRAPHY HEAD AND NECK  CT PERFUSION BRAIN TECHNIQUE: Multidetector CT imaging of the head and neck was performed using the standard protocol during bolus administration of intravenous contrast. Multiplanar CT image reconstructions and MIPs were obtained to evaluate the vascular anatomy. Carotid stenosis measurements (when applicable) are obtained utilizing NASCET criteria, using the distal internal carotid diameter as the denominator. Multiphase CT imaging of the brain was performed following IV bolus contrast injection. Subsequent parametric perfusion maps were calculated using RAPID software. CONTRAST:  Dose is currently not available COMPARISON:  None. FINDINGS: The scanner has a malfunction such that images are being reviewed on TeraRecon rather than in pacs CTA NECK FINDINGS Aortic arch: No acute finding Right carotid system: Moderate atherosclerotic plaque mainly at the bifurcation. No ulceration or flow limiting stenosis. Left carotid system: Atherosclerotic plaque to a moderate degree, mainly at the bifurcation. No flow limiting stenosis or ulceration. Vertebral arteries: No proximal subclavian stenosis or ulceration. Moderate narrowing at the right vertebral origin due to atherosclerosis. Skeleton: Cervical spine degeneration with bulky ridging. Other neck: No acute finding Upper chest: Patulous esophagus. Patchy air trapping in the apical lungs. Review of the MIP images confirms the above findings CTA HEAD FINDINGS Anterior circulation: Right M1 occlusion, expected based on prior head CT. The right A1 is patent. No additional embolism is seen. Poor collaterals at the arterial phase. Posterior circulation: Widely patent vertebral arteries. Mild-to-moderate atheromatous irregularity of the basilar. Extensive atherosclerotic narrowing of the bilateral posterior cerebral arteries with high-grade proximal stenoses. Venous sinuses: Not well assessed on this arterial study Anatomic variants: None significant Delayed phase: Not obtained  Review of the MIP images confirms the above findings CT Brain Perfusion Findings: ASPECTS: 3 CBF (<30%) Volume: 136mL Perfusion (Tmax>6.0s) volume: 12mL Mismatch Volume: 101mL Infarction Location:Right MCA territory IMPRESSION: 1. 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  2. No flow limiting stenosis or embolic source seen in the right ICA. 3. Advanced atherosclerosis of the bilateral posterior cerebral arteries. There are poor collaterals along the cerebral convexities. Electronically Signed   By: Monte Fantasia M.D.   On: 04/15/2019 11:13   Ct Head Code Stroke Wo Contrast  Result Date: 04/15/2019 CLINICAL DATA:  Code stroke. Left-sided weakness. Right facial droop EXAM: CT HEAD WITHOUT CONTRAST TECHNIQUE: Contiguous axial images were obtained from the base of the skull through the vertex without intravenous contrast. COMPARISON:  12/14/2018 FINDINGS: Brain: Cytotoxic edema throughout the large majority of the right MCA territory. High-density appearance of the right caudate head is attributed to preserved gray matter rather than hemorrhage. No acute hemorrhage is seen. Remote right occipital infarct. Chronic small vessel ischemia. No midline shift. Vascular: There may be hyperdense right M1 segment, which would be expected Skull: Negative Sinuses/Orbits: Chronic right maxillary sinusitis with near complete opacification. Other: These results were called by telephone at the time of interpretation on 04/15/2019 at 10:36 am to Dr. Rory Percy, who was already aware PACS is down at time of study and images were reviewed in TeraRecon ASPECTS [facility] Stroke Program Early CT Score) - Ganglionic level infarction (caudate, lentiform nuclei, internal capsule, insula, M1-M3 cortex): 1 - Supraganglionic infarction (M4-M6 cortex): 2 Total score (0-10 with 10 being normal): 3 IMPRESSION: 1. Large acute right MCA territory infarct.  ASPECTS is 3 at  best. 2. No acute hemorrhage. Electronically Signed   By: Monte Fantasia M.D.   On: 04/15/2019 10:38    Pending Labs Unresulted Labs (From admission, onward)    Start     Ordered   04/16/19 0500  Hemoglobin A1c  Tomorrow morning,   R     04/15/19 1158   04/16/19 0500  Lipid panel  Tomorrow morning,   R    Comments: Fasting    04/15/19 1158   04/15/19 1049  SARS Coronavirus 2 (CEPHEID - Performed in [place] hospital lab), Hosp Order  (Asymptomatic Patients Labs)  Once,   STAT    Question:  Rule Out  Answer:  Yes   04/15/19 1048   04/15/19 0952  Urine rapid drug screen (hosp performed)  ONCE - STAT,   STAT     04/15/19 0952   04/15/19 0952  Urinalysis, Routine w reflex microscopic  ONCE - STAT,   STAT     04/15/19 0952          Vitals/Pain Today's Vitals   04/15/19 1100 04/15/19 1115 04/15/19 1130 04/15/19 1145  BP: (!) 170/110 (!) 165/102 (!) 164/105 (!) 152/100  Pulse: 97 92 94 87  Resp: 17 14 12 11   Temp:      TempSrc:      SpO2: 92% 95% 93% 94%  Weight:      Height:      PainSc:        Isolation Precautions No active isolations  Medications Medications  insulin aspart (novoLOG) injection 0-15 Units (has no administration in time range)  enoxaparin (LOVENOX) injection 40 mg (has no administration in time range)   stroke: mapping our early stages of recovery book (has no administration in time range)  0.9 %  sodium chloride infusion (has no administration in time range)  acetaminophen (TYLENOL) tablet 650 mg (has no administration in time range)    Or  acetaminophen (TYLENOL) solution 650 mg (has no administration in time range)    Or  acetaminophen (TYLENOL) suppository 650 mg (has no administration  in time range)  senna-docusate (Senokot-S) tablet 1 tablet (has no administration in time range)  aspirin suppository 300 mg (has no administration in time range)    Or  aspirin tablet 325 mg (has no administration in time range)  insulin aspart (novoLOG)  injection 0-5 Units (has no administration in time range)  valproate (DEPACON) 500 mg in dextrose 5 % 50 mL IVPB (has no administration in time range)  valproate (DEPACON) 1,000 mg in dextrose 5 % 50 mL IVPB (has no administration in time range)  iohexol (OMNIPAQUE) 350 MG/ML injection 100 mL (100 mLs Intravenous Contrast Given 04/15/19 1103)  sodium chloride 0.9 % bolus 1,000 mL (1,000 mLs Intravenous New Bag/Given 04/15/19 1153)    Mobility non-ambulatory High fall risk   Focused Assessments Neuro Assessment Handoff:  Swallow screen pass? No    NIH Stroke Scale ( + Modified Stroke Scale Criteria)  Interval: Initial Level of Consciousness (1a.)   : Not alert, but arousable by minor stimulation to obey, answer, or respond LOC Questions (1b. )   +: Answers neither question correctly LOC Commands (1c. )   + : Performs neither task correctly Best Gaze (2. )  +: Forced deviation Visual (3. )  +: Partial hemianopia Facial Palsy (4. )    : Minor paralysis Motor Arm, Left (5a. )   +: No effort against gravity Motor Arm, Right (5b. )   +: No drift Motor Leg, Left (6a. )   +: Some effort against gravity Motor Leg, Right (6b. )   +: No drift Limb Ataxia (7. ): Present in one limb Sensory (8. )   +: Mild-to-moderate sensory loss, patient feels pinprick is less sharp or is dull on the affected side, or there is a loss of superficial pain with pinprick, but patient is aware of being touched Best Language (9. )   +: Mute, global aphasia Dysarthria (10. ): Intubated or other physical barrier Extinction/Inattention (11.)   +: Profound hemi-inattention or extinction to more than one modality Modified SS Total  +: 18 Complete NIHSS TOTAL: 21 Last date known well: 04/14/19 Last time known well: 2300 Neuro Assessment:   Neuro Checks:   Initial (04/15/19 1000)  Last Documented NIHSS Modified Score: 18 (04/15/19 1148) Has TPA been given? No If patient is a Neuro Trauma and patient is going to OR  before floor call report to [place] nurse: [phone] or [phone]     R Recommendations: See Admitting Provider Note  Report given to: Genia Del RN  Additional Notes:

## 2019-04-15 NOTE — ED Triage Notes (Signed)
Pt BIB [facility] from [place]. Per EMS pt altered mental status. Facility stated that pt was normal 2300 last night and pt altered this morning sometime. LKW 2300. Pt presents with right sided facial droop. Per EMS not moving left arm and not opening eye and not responding to commands. Upon arrival to room, pt alert, moving all extremities and nods to answer questions. Pt with history of dementia and seizures. VSS.

## 2019-04-15 NOTE — Consult Note (Signed)
Consultation Note Date: 04/15/2019   Patient Name: William [name]  DOB: Apr 21, 1946  MRN: [id number]  Age / Sex: 73 y.o., male  PCP: Patient, No Pcp Per Referring Physician: Karmen Bongo, MD  Reason for Consultation: Establishing goals of care  HPI/Patient Profile: 73 y.o. male  with past medical history of dementia, seizure do, ETOH abuse, CKD 3, DM4, LVEF 40% who was admitted from SNF on 04/15/2019 with altered mental status, left sided weakness and facial droop.  Imaging revealed a large MCA CVA..  The patient is currently very minimally responsive.  Per neurology this stroke will definitely leave him extremely disabled.  Palliative Medicine was consulted for goals of care.  Clinical Assessment and Goals of Care:  I have reviewed medical records including EPIC notes, labs and imaging, received report from the care team, assessed the patient and then attempted to call his family to discuss diagnosis prognosis, GOC, EOL wishes, disposition and options.  I reached out to 4 members of his family.  I was able to leave a voice mail message for Delphina Cahill and one for [employer].  Helene Kelp Bullins' number did not allow me to leave a message.  CSW reached out to [facility] for more contact information.  She provided me with the patient's daughter's contact number - Maximos Zayas [phone].  I called Jeani Hawking who answered her phone but quickly hung up on me after she learned I was from [facility].  Primary Decision Maker:  NEXT OF KIN  My understanding is that Mr. Sarver is not married.  I believe that legally if Mr. Shams is unable to speak for himself then the majority of his adult children Rider Ermis) is his decision maker by default.     SUMMARY OF RECOMMENDATIONS    DNR. Devastating stroke. Would continue to make every effort to reach his family.  He seems most appropriate for comfort  and Hospice care at this point.   Code Status/Advance Care Planning:  DNR   Symptom Management:   Will add Haldol PRN for agitation.   Additional Recommendations (Limitations, Scope, Preferences):  Full Scope Treatment - until family can be reached.  Palliative Prophylaxis:   Frequent Pain Assessment  Psycho-social/Spiritual:   Desire for further Chaplaincy support: not at this time.  Prognosis:  Less than two weeks given devastating stroke and an inability to take PO nutrition or hydration.    Discharge Planning: To Be Determined      Primary Diagnoses: Present on Admission: . CVA (cerebral vascular accident) ([place]) . Diabetes mellitus due to underlying condition, uncontrolled, with renal complication ([place]) . Essential hypertension . Hyperlipidemia . CKD (chronic kidney disease) stage 4, GFR 15-29 ml/min ([facility])   I have reviewed the medical record, interviewed the patient and family, and examined the patient. The following aspects are pertinent.  Past Medical History:  Diagnosis Date  . Abnormality of gait 01/14/2016  . Alcohol abuse 2009   dependency as exhibited by ETOH withdrawal seizure.   . Alcohol withdrawal seizure ([place])  2013  . Anemia 2011   Normocytic.   . Cataract   . Chronic renal insufficiency, stage III (moderate) ([place]) 02/26/2014  . Depression with anxiety   . Hearing impairment   . HTN (hypertension), benign 02/26/2014  . Hyperlipemia   . Seizures ([place])   . Type II or unspecified type diabetes mellitus with neurological manifestations, not stated as uncontrolled(250.60) 02/26/2014   Social History   Socioeconomic History  . Marital status: Single    Spouse name: Not on file  . Number of children: 0  . Years of education: Not on file  . Highest education level: Not on file  Occupational History  . Not on file  Social Needs  . Financial resource strain: Not on file  . Food insecurity    Worry: Not on file    Inability: Not on file  .  Transportation needs    Medical: Not on file    Non-medical: Not on file  Tobacco Use  . Smoking status: Former Smoker    Types: Cigars  . Smokeless tobacco: Current User    Types: Chew  Substance and Sexual Activity  . Alcohol use: No    Alcohol/week: 0.0 standard drinks    Comment: 04/2015 I have not drank in over a month "  . Drug use: No  . Sexual activity: Not Currently  Lifestyle  . Physical activity    Days per week: Not on file    Minutes per session: Not on file  . Stress: Not on file  Relationships  . Social Herbalist on phone: Not on file    Gets together: Not on file    Attends religious service: Not on file    Active member of club or organization: Not on file    Attends meetings of clubs or organizations: Not on file    Relationship status: Not on file  Other Topics Concern  . Not on file  Social History Narrative   ** Merged History Encounter **[facility]          Family History  Family history unknown: Yes   Scheduled Meds: . aspirin  300 mg Rectal Daily   Or  . aspirin  325 mg Oral Daily  . enoxaparin (LOVENOX) injection  40 mg Subcutaneous Q24H  . insulin aspart  0-15 Units Subcutaneous TID WC  . insulin aspart  0-5 Units Subcutaneous QHS   Continuous Infusions: . sodium chloride 50 mL/hr at 04/15/19 1610  . valproate sodium    . valproate sodium Stopped (04/15/19 1534)   PRN Meds:.acetaminophen **OR** acetaminophen (TYLENOL) oral liquid 160 mg/5 mL **OR** acetaminophen, senna-docusate No Known Allergies Review of Systems patient non-responsive Physical Exam  Elderly male.  Does not respond to voice or touch.  Moves his arm slightly to sternal rub.  Chewing motions w/ mouth, non purposeful movement of his right leg.  No distress.  Vital Signs: BP (!) 154/98 (BP Location: Left Arm)   Pulse 74   Temp 98.7 F (37.1 C) (Oral)   Resp 14   Ht 6' (1.829 m)   Wt 90.7 kg   SpO2 100%   BMI 27.12 kg/m  Pain Scale: Faces   Pain  Score: 0-No pain   SpO2: SpO2: 100 % O2 Device:SpO2: 100 % O2 Flow Rate: .O2 Flow Rate (L/min): 2 L/min  IO: Intake/output summary:   Intake/Output Summary (Last 24 hours) at 04/15/2019 1613 Last data filed at 04/15/2019 1610 Gross per 24 hour  Intake  93.71 ml  Output -  Net 93.71 ml    LBM:   Baseline Weight: Weight: 90.7 kg Most recent weight: Weight: 90.7 kg     Palliative Assessment/Data:  10%     Time In: 4:00 Time Out: 4:50 Time Total: 50 min. Visit consisted of counseling and education dealing with the complex and emotionally intense issues surrounding the need for palliative care and symptom management in the setting of serious and potentially life-threatening illness. Greater than 50%  of this time was spent counseling and coordinating care related to the above assessment and plan.  Signed by: Florentina Jenny, PA-C Palliative Medicine Pager: [phone]  Please contact Palliative Medicine Team phone at [phone] for questions and concerns.  For individual provider: See Shea Evans

## 2019-04-15 NOTE — Progress Notes (Signed)
CSW contacted by palliative medicine team NP to assist with locating family contacts. CSW contacted patient's facility, [facility], to ask about patient's contacts on file. CSW obtained names and numbers for: Briscoe Deutscher (listed in [place]' files as a cousin) [phone], and Javaughn Opdahl (daughter) [phone]. Per [facility], patient is usually alert and oriented and has been his own decision maker, so they have not had much family contact on his behalf.   CSW provided information to PMT. CSW to follow.  Laveda Abbe, [place] Clinical Social Worker [phone]

## 2019-04-15 NOTE — ED Notes (Signed)
CBG collected. Result "178." RN, Adella Hare., notified.

## 2019-04-15 NOTE — H&P (Signed)
History and Physical    William [name]:[id number] DOB: [birth year] DOA: 04/15/2019  PCP: Patient, No Pcp Per Consultants:  Jannifer Franklin - neurology Patient coming from: [place]; NOK: Briscoe Deutscher, [phone], significant other; step-daughter, Lajuan Lines, [phone]  Chief Complaint: AMS  HPI: William [name] is a 73 y.o. male with medical history significant of dementia; DM; seizure d/o; HTN; HLD; heating impairment; depression/anxiety; ETOH dependence; and stage 3 CKD presenting with AMS.  Left-sided hemiparesis left facial droop noted.  He is comatose and unable to provide further information.   He was admitted in 3/20 with AMS associated with seizures and AKI.  Palliative care was consulted and spoke with his significant other of 40 years, Briscoe Deutscher.  Patient was made DNR at that time.  ED Course:  L-sided weakness, facial droop.  Large R-sided CVA.  Not amenable to treatment.  Review of Systems:  Unable to perform  PMH, PSH, SH, and FM were reviewed in Epic  Past Medical History:  Diagnosis Date   Abnormality of gait 01/14/2016   Alcohol abuse 2009   dependency as exhibited by ETOH withdrawal seizure.    Alcohol withdrawal seizure ([place]) 2013   Anemia 2011   Normocytic.    Cataract    Chronic renal insufficiency, stage III (moderate) (HCC) 02/26/2014   Depression with anxiety    Hearing impairment    HTN (hypertension), benign 02/26/2014   Hyperlipemia    Seizures (HCC)    Type II or unspecified type diabetes mellitus with neurological manifestations, not stated as uncontrolled(250.60) 02/26/2014    Past Surgical History:  Procedure Laterality Date   NO PAST SURGERIES      Social History   Socioeconomic History   Marital status: Single    Spouse name: Not on file   Number of children: 0   Years of education: Not on file   Highest education level: Not on file  Occupational History   Not on file  Social Needs    Financial resource strain: Not on file   Food insecurity    Worry: Not on file    Inability: Not on file   Transportation needs    Medical: Not on file    Non-medical: Not on file  Tobacco Use   Smoking status: Former Smoker    Types: Cigars   Smokeless tobacco: Current User    Types: Chew  Substance and Sexual Activity   Alcohol use: No    Alcohol/week: 0.0 standard drinks    Comment: 04/2015 I have not drank in over a month "   Drug use: No   Sexual activity: Not Currently  Lifestyle   Physical activity    Days per week: Not on file    Minutes per session: Not on file   Stress: Not on file  Relationships   Social connections    Talks on phone: Not on file    Gets together: Not on file    Attends religious service: Not on file    Active member of club or organization: Not on file    Attends meetings of clubs or organizations: Not on file    Relationship status: Not on file   Intimate partner violence    Fear of current or ex partner: Not on file    Emotionally abused: Not on file    Physically abused: Not on file    Forced sexual activity: Not on file  Other Topics Concern   Not on file  Social History  Narrative   ** Merged History Encounter **[facility]           No Known Allergies  Family History  Family history unknown: Yes    Prior to Admission medications   Medication Sig Start Date End Date Taking? Authorizing Provider  busPIRone (BUSPAR) 5 MG tablet Take 2.5 mg by mouth 2 (two) times daily.    Yes [provider]  divalproex (DEPAKOTE) 500 MG DR tablet Take 500-1,000 mg by mouth See admin instructions. Give 500 mg at 0900 and 1700  Give 1000 mg at bedtime   Yes [provider]  Ensure (ENSURE) Take 120 mLs by mouth 3 (three) times daily between meals.   Yes [provider]  folic acid (FOLVITE) 1 MG tablet Take 1 tablet (1 mg total) by mouth daily. 05/07/15  Yes Liberty Handy, MD  insulin aspart (NOVOLOG) 100  UNIT/ML injection Inject 0-10 Units into the skin 3 (three) times daily before meals. Call MD if CBG >400.  Takes at [place], 1130, 1630 1-179 = 0 units 180-400 = 10 units   Yes [provider]  Insulin Glargine (TOUJEO MAX SOLOSTAR) 300 UNIT/ML SOPN Inject 0-165 Units into the skin See admin instructions. Per Sliding Scale once daily 0-180 give 0 units 181-450 give 165 units   Yes [provider]  liraglutide (VICTOZA) 18 MG/3ML SOPN Inject 1.2 mg into the skin daily.   Yes [provider]  LOKELMA 10 g PACK packet Take 10 g by mouth daily. 03/06/19  Yes [provider]  Multiple Vitamin (MULTIVITAMIN WITH MINERALS) TABS tablet Take 1 tablet by mouth daily. 12/25/18  Yes Sheikh, Omair Latif, DO  PULMICORT FLEXHALER 90 MCG/ACT inhaler Inhale 1 puff into the lungs 2 (two) times a day. 04/08/19  Yes [provider]  QUEtiapine (SEROQUEL) 100 MG tablet Take 100 mg by mouth 2 (two) times daily.    Yes [provider]  sertraline (ZOLOFT) 100 MG tablet Take 100 mg by mouth at bedtime.   Yes [provider]  budesonide (PULMICORT) 0.25 MG/2ML nebulizer solution Take 2 mLs (0.25 mg total) by nebulization 2 (two) times daily. Patient not taking: Reported on 04/15/2019 12/24/18   Raiford Noble Latif, DO  cetaphil (CETAPHIL) lotion Apply topically 2 (two) times daily. Patient not taking: Reported on 04/15/2019 12/24/18   Raiford Noble Latif, DO  hydrOXYzine (ATARAX/VISTARIL) 10 MG tablet Take 1 tablet (10 mg total) by mouth 3 (three) times daily as needed for itching. Patient not taking: Reported on 04/15/2019 12/24/18   Raiford Noble Latif, DO  ipratropium-albuterol (DUONEB) 0.5-2.5 (3) MG/3ML SOLN Take 3 mLs by nebulization every 6 (six) hours as needed. Patient not taking: Reported on 04/15/2019 12/24/18   Raiford Noble Latif, DO  levETIRAcetam (KEPPRA) 250 MG tablet Take 1 tablet (250 mg total) by mouth 2 (two) times daily. Patient not taking: Reported  on 04/15/2019 12/24/18   Raiford Noble Latif, DO  ondansetron (ZOFRAN) 4 MG tablet Take 1 tablet (4 mg total) by mouth every 6 (six) hours as needed for nausea. Patient not taking: Reported on 04/15/2019 12/24/18   Raiford Noble Latif, DO  predniSONE (DELTASONE) 10 MG tablet Take 40 mg for 1 Day, 30 mg for 2 days, 20 mg for 2 Days, and 10 mg x2 Days and then stop. Patient not taking: Reported on 04/15/2019 12/24/18   Raiford Noble Latif, DO  senna-docusate (SENOKOT-S) 8.6-50 MG tablet Take 1 tablet by mouth at bedtime as needed for mild constipation. Patient  not taking: Reported on 04/15/2019 12/24/18   Kerney Elbe, DO    Physical Exam: Vitals:   04/15/19 1100 04/15/19 1115 04/15/19 1130 04/15/19 1145  BP: (!) 170/110 (!) 165/102 (!) 164/105 (!) 152/100  Pulse: 97 92 94 87  Resp: 17 14 12 11   Temp:      TempSrc:      SpO2: 92% 95% 93% 94%  Weight:      Height:          General:  Comatose, GCS 11  Eyes:  normal lids, closed  ENT:  normal lips & tongue  Neck:  no LAD, masses or thyromegaly  Cardiovascular:  RRR, no m/r/g. No LE edema.   Respiratory:   CTA bilaterally with no wheezes/rales/rhonchi.  Intermittent periods of apnea lasting for up to 10 seconds.  Abdomen:  soft, NT, ND, NABS  Skin:  no rash or induration seen on limited exam  Musculoskeletal:  L side is flacid although both legs jumped with touch  Psychiatric:  comatose  Neurologic:  Unable to perform    Radiological Exams on Admission: Ct Code Stroke Cta Head W/wo Contrast  Result Date: 04/15/2019 CLINICAL DATA:  Left-sided weakness EXAM: CT ANGIOGRAPHY HEAD AND NECK CT PERFUSION BRAIN TECHNIQUE: Multidetector CT imaging of the head and neck was performed using the standard protocol during bolus administration of intravenous contrast. Multiplanar CT image reconstructions and MIPs were obtained to evaluate the vascular anatomy. Carotid stenosis measurements (when applicable) are obtained utilizing NASCET  criteria, using the distal internal carotid diameter as the denominator. Multiphase CT imaging of the brain was performed following IV bolus contrast injection. Subsequent parametric perfusion maps were calculated using RAPID software. CONTRAST:  Dose is currently not available COMPARISON:  None. FINDINGS: The scanner has a malfunction such that images are being reviewed on TeraRecon rather than in pacs CTA NECK FINDINGS Aortic arch: No acute finding Right carotid system: Moderate atherosclerotic plaque mainly at the bifurcation. No ulceration or flow limiting stenosis. Left carotid system: Atherosclerotic plaque to a moderate degree, mainly at the bifurcation. No flow limiting stenosis or ulceration. Vertebral arteries: No proximal subclavian stenosis or ulceration. Moderate narrowing at the right vertebral origin due to atherosclerosis. Skeleton: Cervical spine degeneration with bulky ridging. Other neck: No acute finding Upper chest: Patulous esophagus. Patchy air trapping in the apical lungs. Review of the MIP images confirms the above findings CTA HEAD FINDINGS Anterior circulation: Right M1 occlusion, expected based on prior head CT. The right A1 is patent. No additional embolism is seen. Poor collaterals at the arterial phase. Posterior circulation: Widely patent vertebral arteries. Mild-to-moderate atheromatous irregularity of the basilar. Extensive atherosclerotic narrowing of the bilateral posterior cerebral arteries with high-grade proximal stenoses. Venous sinuses: Not well assessed on this arterial study Anatomic variants: None significant Delayed phase: Not obtained Review of the MIP images confirms the above findings CT Brain Perfusion Findings: ASPECTS: 3 CBF (<30%) Volume: 167mL Perfusion (Tmax>6.0s) volume: 157mL Mismatch Volume: 62mL Infarction Location:Right MCA territory IMPRESSION: 1. 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 2. No flow limiting stenosis or embolic source seen in the right ICA. 3. Advanced atherosclerosis of the bilateral posterior cerebral arteries. There are poor collaterals along the cerebral convexities. Electronically Signed   By: Monte Fantasia M.D.   On: 04/15/2019 11:13   Ct Code Stroke Cta Neck W/wo Contrast  Result Date: 04/15/2019 CLINICAL DATA:  Left-sided weakness EXAM: CT  ANGIOGRAPHY HEAD AND NECK CT PERFUSION BRAIN TECHNIQUE: Multidetector CT imaging of the head and neck was performed using the standard protocol during bolus administration of intravenous contrast. Multiplanar CT image reconstructions and MIPs were obtained to evaluate the vascular anatomy. Carotid stenosis measurements (when applicable) are obtained utilizing NASCET criteria, using the distal internal carotid diameter as the denominator. Multiphase CT imaging of the brain was performed following IV bolus contrast injection. Subsequent parametric perfusion maps were calculated using RAPID software. CONTRAST:  Dose is currently not available COMPARISON:  None. FINDINGS: The scanner has a malfunction such that images are being reviewed on TeraRecon rather than in pacs CTA NECK FINDINGS Aortic arch: No acute finding Right carotid system: Moderate atherosclerotic plaque mainly at the bifurcation. No ulceration or flow limiting stenosis. Left carotid system: Atherosclerotic plaque to a moderate degree, mainly at the bifurcation. No flow limiting stenosis or ulceration. Vertebral arteries: No proximal subclavian stenosis or ulceration. Moderate narrowing at the right vertebral origin due to atherosclerosis. Skeleton: Cervical spine degeneration with bulky ridging. Other neck: No acute finding Upper chest: Patulous esophagus. Patchy air trapping in the apical lungs. Review of the MIP images confirms the above findings CTA HEAD FINDINGS Anterior circulation: Right M1 occlusion, expected based on prior head CT. The right A1  is patent. No additional embolism is seen. Poor collaterals at the arterial phase. Posterior circulation: Widely patent vertebral arteries. Mild-to-moderate atheromatous irregularity of the basilar. Extensive atherosclerotic narrowing of the bilateral posterior cerebral arteries with high-grade proximal stenoses. Venous sinuses: Not well assessed on this arterial study Anatomic variants: None significant Delayed phase: Not obtained Review of the MIP images confirms the above findings CT Brain Perfusion Findings: ASPECTS: 3 CBF (<30%) Volume: 188mL Perfusion (Tmax>6.0s) volume: 170mL Mismatch Volume: 32mL Infarction Location:Right MCA territory IMPRESSION: 1. 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 2. No flow limiting stenosis or embolic source seen in the right ICA. 3. Advanced atherosclerosis of the bilateral posterior cerebral arteries. There are poor collaterals along the cerebral convexities. Electronically Signed   By: Monte Fantasia M.D.   On: 04/15/2019 11:13   Ct Code Stroke Cta Cerebral Perfusion W/wo Contrast  Result Date: 04/15/2019 CLINICAL DATA:  Left-sided weakness EXAM: CT ANGIOGRAPHY HEAD AND NECK CT PERFUSION BRAIN TECHNIQUE: Multidetector CT imaging of the head and neck was performed using the standard protocol during bolus administration of intravenous contrast. Multiplanar CT image reconstructions and MIPs were obtained to evaluate the vascular anatomy. Carotid stenosis measurements (when applicable) are obtained utilizing NASCET criteria, using the distal internal carotid diameter as the denominator. Multiphase CT imaging of the brain was performed following IV bolus contrast injection. Subsequent parametric perfusion maps were calculated using RAPID software. CONTRAST:  Dose is currently not available COMPARISON:  None. FINDINGS: The scanner has a malfunction such that images are being reviewed on TeraRecon  rather than in pacs CTA NECK FINDINGS Aortic arch: No acute finding Right carotid system: Moderate atherosclerotic plaque mainly at the bifurcation. No ulceration or flow limiting stenosis. Left carotid system: Atherosclerotic plaque to a moderate degree, mainly at the bifurcation. No flow limiting stenosis or ulceration. Vertebral arteries: No proximal subclavian stenosis or ulceration. Moderate narrowing at the right vertebral origin due to atherosclerosis. Skeleton: Cervical spine degeneration with bulky ridging. Other neck: No acute finding Upper chest: Patulous esophagus. Patchy air trapping in the apical lungs. Review of the MIP images confirms the above findings CTA HEAD FINDINGS  Anterior circulation: Right M1 occlusion, expected based on prior head CT. The right A1 is patent. No additional embolism is seen. Poor collaterals at the arterial phase. Posterior circulation: Widely patent vertebral arteries. Mild-to-moderate atheromatous irregularity of the basilar. Extensive atherosclerotic narrowing of the bilateral posterior cerebral arteries with high-grade proximal stenoses. Venous sinuses: Not well assessed on this arterial study Anatomic variants: None significant Delayed phase: Not obtained Review of the MIP images confirms the above findings CT Brain Perfusion Findings: ASPECTS: 3 CBF (<30%) Volume: 135mL Perfusion (Tmax>6.0s) volume: 136mL Mismatch Volume: 15mL Infarction Location:Right MCA territory IMPRESSION: 1. 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 2. No flow limiting stenosis or embolic source seen in the right ICA. 3. Advanced atherosclerosis of the bilateral posterior cerebral arteries. There are poor collaterals along the cerebral convexities. Electronically Signed   By: Monte Fantasia M.D.   On: 04/15/2019 11:13   Ct Head Code Stroke Wo Contrast  Result Date: 04/15/2019 CLINICAL DATA:  Code stroke.  Left-sided weakness. Right facial droop EXAM: CT HEAD WITHOUT CONTRAST TECHNIQUE: Contiguous axial images were obtained from the base of the skull through the vertex without intravenous contrast. COMPARISON:  12/14/2018 FINDINGS: Brain: Cytotoxic edema throughout the large majority of the right MCA territory. High-density appearance of the right caudate head is attributed to preserved gray matter rather than hemorrhage. No acute hemorrhage is seen. Remote right occipital infarct. Chronic small vessel ischemia. No midline shift. Vascular: There may be hyperdense right M1 segment, which would be expected Skull: Negative Sinuses/Orbits: Chronic right maxillary sinusitis with near complete opacification. Other: These results were called by telephone at the time of interpretation on 04/15/2019 at 10:36 am to Dr. Rory Percy, who was already aware PACS is down at time of study and images were reviewed in TeraRecon ASPECTS [facility] Stroke Program Early CT Score) - Ganglionic level infarction (caudate, lentiform nuclei, internal capsule, insula, M1-M3 cortex): 1 - Supraganglionic infarction (M4-M6 cortex): 2 Total score (0-10 with 10 being normal): 3 IMPRESSION: 1. Large acute right MCA territory infarct.  ASPECTS is 3 at best. 2. No acute hemorrhage. Electronically Signed   By: Monte Fantasia M.D.   On: 04/15/2019 10:38    EKG: Independently reviewed.  NSR with rate 93; nonspecific ST changes with no evidence of acute ischemia   Labs on Admission: I have personally reviewed the available labs and imaging studies at the time of the admission.  Pertinent labs:   Glucose 189 BUN 30/Creatinine 2.18/GFR 29 - stable Unremarkable CBC INR 1.3 ETOH negative COVID negative   Assessment/Plan Principal Problem:   CVA (cerebral vascular accident) ([place]) Active Problems:   Essential hypertension   CKD (chronic kidney disease) stage 4, GFR 15-29 ml/min (HCC)   Seizure (HCC)   Hyperlipidemia   Diabetes mellitus due to  underlying condition, uncontrolled, with renal complication ([place])   CVA -Patient presenting from facility with AMS and left hemiparesis -Concerning for CVA -CT/CTA shows a large right-sided MCA -Will admit for further CVA evaluation -Telemetry monitoring on PCU -He appears to have a poor overall prognosis -MRI -Echo in 3/20 with chronic combined CHF (no evidence of decompensation on exam at this time); will repeat -Risk stratification with FLP, A1c; will also check TSH and UDS -ASA daily -Neurology consult -PT/OT/ST/Nutrition Consults -SW consult for placement -Palliative care consult to discuss goals of care; Dr. Rory Percy and I were both unable to reach family  Stage 4 CKD -Patient  with chronic and slowly worsening CKD -Overall stable from 3/20 admission -Will trend  HTN -Allow permissive HTN for now -Treat BP only if >220/120, and then with goal of 15% reduction   HLD -Check FLP -Start Lipitor 40 mg daily when able to take PO   DM -Recent A1c shows reasonably good control, 7.6 in 3/20 -He is on a large dose of Toujeo, but interestingly only when his glucose is significantly elevated -Will order moderate-scale SSI for now with qhs coverage and consider basal insulin on a standing basis  Seizure disorder -Patient takes Depakote at home -Pharmacy assistance requested to transition this to IV for now -Keppra appears to have been stopped between his last tele-neurology appointment on 4/7 and now.    Note: This patient has been tested and is negative for the novel coronavirus COVID-19.   DVT prophylaxis:  Lovenox  Code Status: DNR - confirmed with out-of-facility form and prior chart records Family Communication: None present; I was unable to reach family despite multiple attempts by telephone Disposition Plan:  To be determined Consults called: Neurology, Palliative Care; PT/OT/ST/SW/Nutrition  Admission status: Admit - It is my clinical opinion that admission to INPATIENT  is reasonable and necessary because of the expectation that this patient will require hospital care that crosses at least 2 midnights to treat this condition based on the medical complexity of the problems presented.  Given the aforementioned information, the predictability of an adverse outcome is felt to be significant.     Karmen Bongo MD [facility]   How to contact the [facility] Attending or Consulting provider [place] or covering provider during after hours [place], for this patient?  1. Check the care team in [facility] and look for a) attending/consulting [facility] provider listed and b) the [facility] team listed 2. Log into [email or web address] and use [place]'s universal password to access. If you do not have the password, please contact the hospital operator. 3. Locate the [facility] provider you are looking for under [facility] and page to a number that you can be directly reached. 4. If you still have difficulty reaching the provider, please page the [facility] (Director on Call) for the Hospitalists listed on amion for assistance.   04/15/2019, 12:35 PM

## 2019-04-15 NOTE — ED Provider Notes (Signed)
[facility] EMERGENCY DEPARTMENT Provider Note   CSN: [id number] Arrival date & time: 04/15/19  6195    History   Chief Complaint Chief Complaint  Patient presents with   Altered Mental Status   Code Stroke   LEVEL 5 CAVEAT - AMS, APHASIA HPI ELIBERTO SOLE is a 73 y.o. male who presents to the ED via EMS from [place] for altered mental status, left sided facial droop, and left arm weakness that they noticed this AM. Last known normal was 11 PM last night. Per SNF pt is typically verbal and ambulatory.        Past Medical History:  Diagnosis Date   Abnormality of gait 01/14/2016   Alcohol abuse 2009   dependency as exhibited by ETOH withdrawal seizure.    Alcohol withdrawal seizure ([place]) 2013   Anemia 2011   Normocytic.    Cataract    Chronic renal insufficiency, stage III (moderate) (HCC) 02/26/2014   Depression with anxiety    Hearing impairment    HTN (hypertension), benign 02/26/2014   Hyperlipemia    Seizures (HCC)    Type II or unspecified type diabetes mellitus with neurological manifestations, not stated as uncontrolled(250.60) 02/26/2014    Patient Active Problem List   Diagnosis Date Noted   CVA (cerebral vascular accident) ([place]) 04/15/2019   Palliative care encounter    Altered mental status, unspecified 12/14/2018   Elevated troponin    Smokeless tobacco use 12/31/2017   Dyspepsia 10/20/2017   Dietary noncompliance 10/20/2017   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) 02/26/2014   Urinary incontinence 02/10/2013    History of alcohol abuse 12/03/2009   Anxiety state 09/30/2006   Depression 09/30/2006   Hearing impairment 09/30/2006   Essential hypertension 09/30/2006    Past Surgical History:  Procedure Laterality Date   NO PAST SURGERIES          Home Medications    Prior to Admission medications   Medication Sig Start Date End Date Taking? Authorizing Provider  busPIRone (BUSPAR) 5 MG tablet Take 2.5 mg by mouth 2 (two) times daily.    Yes [provider]  divalproex (DEPAKOTE) 500 MG DR tablet Take 500-1,000 mg by mouth See admin instructions. Give 500 mg at 0900 and 1700  Give 1000 mg at bedtime   Yes [provider]  Ensure (ENSURE) Take 120 mLs by mouth 3 (three) times daily between meals.   Yes [provider]  folic acid (FOLVITE) 1 MG tablet Take 1 tablet (1 mg total) by mouth daily. 05/07/15  Yes Liberty Handy, MD  insulin aspart (NOVOLOG) 100 UNIT/ML injection Inject 0-10 Units into the skin 3 (three) times daily before meals. Call MD if CBG >400.  Takes at [place], 1130, 1630 1-179 = 0 units 180-400 = 10 units   Yes [provider]  Insulin Glargine (TOUJEO MAX SOLOSTAR) 300 UNIT/ML SOPN Inject 0-165 Units into the skin See admin instructions. Per Sliding Scale once daily 0-180 give 0 units 181-450 give 165 units   Yes [provider]  liraglutide (VICTOZA) 18 MG/3ML SOPN Inject 1.2 mg into the skin  daily.   Yes [provider]  LOKELMA 10 g PACK packet Take 10 g by mouth daily. 03/06/19  Yes [provider]  Multiple Vitamin (MULTIVITAMIN WITH MINERALS) TABS tablet Take 1 tablet by mouth daily. 12/25/18  Yes Sheikh, Omair Latif, DO  PULMICORT FLEXHALER 90 MCG/ACT inhaler Inhale 1 puff into the lungs 2 (two) times a day. 04/08/19  Yes [provider]  QUEtiapine (SEROQUEL) 100 MG tablet Take 100 mg by mouth 2 (two) times daily.    Yes [provider]  sertraline (ZOLOFT) 100 MG tablet Take 100 mg by mouth  at bedtime.   Yes [provider]    Family History Family History  Family history unknown: Yes    Social History Social History   Tobacco Use   Smoking status: Former Smoker    Types: Cigars   Smokeless tobacco: Current User    Types: Chew  Substance Use Topics   Alcohol use: No    Alcohol/week: 0.0 standard drinks    Comment: 04/2015 I have not drank in over a month "   Drug use: No     Allergies   Patient has no known allergies.   Review of Systems Review of Systems  Unable to perform ROS: Mental status change  Constitutional: Negative for fever.  Neurological: Positive for speech difficulty and weakness.     Physical Exam Updated Vital Signs BP (!) 174/111 (BP Location: Right Arm)    Pulse 99    Temp 99.4 F (37.4 C) (Oral)    Resp 17    Ht 6' (1.829 m)    Wt 90.7 kg    SpO2 100%    BMI 27.12 kg/m   Physical Exam Vitals signs and nursing note reviewed.  Constitutional:      Appearance: He is not ill-appearing.  HENT:     Head: Normocephalic and atraumatic.  Eyes:     Conjunctiva/sclera: Conjunctivae normal.  Neck:     Musculoskeletal: Neck supple.  Cardiovascular:     Rate and Rhythm: Normal rate and regular rhythm.  Pulmonary:     Effort: Pulmonary effort is normal.     Breath sounds: Normal breath sounds.  Abdominal:     Palpations: Abdomen is soft.     Tenderness: There is no abdominal tenderness.  Skin:    General: Skin is warm and dry.  Neurological:     Mental Status: He is alert.     Comments: Easily arousable GCS 11 Pt aphasic currently Left sided facial droop present Left arm flaccid Able to move left leg with painful stimuli       ED Treatments / Results  Labs (all labs ordered are listed, but only abnormal results are displayed) Labs Reviewed  PROTIME-INR - Abnormal; Notable for the following components:      Result Value   Prothrombin Time 15.8 (*)    INR 1.3 (*)    All other components within normal  limits  CBC - Abnormal; Notable for the following components:   RDW 15.8 (*)    All other components within normal limits  DIFFERENTIAL - Abnormal; Notable for the following components:   Abs Immature Granulocytes 0.09 (*)    All other components within normal limits  COMPREHENSIVE METABOLIC PANEL - Abnormal; Notable for the following components:   Glucose, Bld 189 (*)    BUN 30 (*)    Creatinine, Ser 2.18 (*)    GFR calc non Af Amer 29 (*)    GFR  calc Af Amer 33 (*)    All other components within normal limits  URINALYSIS, ROUTINE W REFLEX MICROSCOPIC - Abnormal; Notable for the following components:   Color, Urine AMBER (*)    APPearance TURBID (*)    Specific Gravity, Urine 1.044 (*)    Glucose, UA 50 (*)    Hgb urine dipstick MODERATE (*)    Ketones, ur 20 (*)    Protein, ur 100 (*)    Leukocytes,Ua MODERATE (*)    Bacteria, UA MANY (*)    All other components within normal limits  CBG MONITORING, ED - Abnormal; Notable for the following components:   Glucose-Capillary 179 (*)    All other components within normal limits  I-STAT CHEM 8, ED - Abnormal; Notable for the following components:   BUN 30 (*)    Creatinine, Ser 2.00 (*)    Glucose, Bld 183 (*)    Calcium, Ion 1.10 (*)    All other components within normal limits  CBG MONITORING, ED - Abnormal; Notable for the following components:   Glucose-Capillary 178 (*)    All other components within normal limits  SARS CORONAVIRUS 2 (HOSPITAL ORDER, [facility])  ETHANOL  APTT  RAPID URINE DRUG SCREEN, HOSP PERFORMED    EKG None  Radiology Ct Code Stroke Cta Head W/wo Contrast  Result Date: 04/15/2019 CLINICAL DATA:  Left-sided weakness EXAM: CT ANGIOGRAPHY HEAD AND NECK CT PERFUSION BRAIN TECHNIQUE: Multidetector CT imaging of the head and neck was performed using the standard protocol during bolus administration of intravenous contrast. Multiplanar CT image reconstructions and MIPs were  obtained to evaluate the vascular anatomy. Carotid stenosis measurements (when applicable) are obtained utilizing NASCET criteria, using the distal internal carotid diameter as the denominator. Multiphase CT imaging of the brain was performed following IV bolus contrast injection. Subsequent parametric perfusion maps were calculated using RAPID software. CONTRAST:  Dose is currently not available COMPARISON:  None. FINDINGS: The scanner has a malfunction such that images are being reviewed on TeraRecon rather than in pacs CTA NECK FINDINGS Aortic arch: No acute finding Right carotid system: Moderate atherosclerotic plaque mainly at the bifurcation. No ulceration or flow limiting stenosis. Left carotid system: Atherosclerotic plaque to a moderate degree, mainly at the bifurcation. No flow limiting stenosis or ulceration. Vertebral arteries: No proximal subclavian stenosis or ulceration. Moderate narrowing at the right vertebral origin due to atherosclerosis. Skeleton: Cervical spine degeneration with bulky ridging. Other neck: No acute finding Upper chest: Patulous esophagus. Patchy air trapping in the apical lungs. Review of the MIP images confirms the above findings CTA HEAD FINDINGS Anterior circulation: Right M1 occlusion, expected based on prior head CT. The right A1 is patent. No additional embolism is seen. Poor collaterals at the arterial phase. Posterior circulation: Widely patent vertebral arteries. Mild-to-moderate atheromatous irregularity of the basilar. Extensive atherosclerotic narrowing of the bilateral posterior cerebral arteries with high-grade proximal stenoses. Venous sinuses: Not well assessed on this arterial study Anatomic variants: None significant Delayed phase: Not obtained Review of the MIP images confirms the above findings CT Brain Perfusion Findings: ASPECTS: 3 CBF (<30%) Volume: 160mL Perfusion (Tmax>6.0s) volume: 141mL Mismatch Volume: 63mL Infarction Location:Right MCA territory  IMPRESSION: 1. 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 2. No flow limiting stenosis or embolic source seen in the right ICA. 3. Advanced atherosclerosis of the bilateral posterior cerebral arteries. There are poor collaterals along the  cerebral convexities. Electronically Signed   By: Monte Fantasia M.D.   On: 04/15/2019 11:13   Dg Chest 2 View  Result Date: 04/15/2019 CLINICAL DATA:  Transient ischemic attack. EXAM: CHEST - 2 VIEW COMPARISON:  Radiograph December 24, 2018. FINDINGS: Stable cardiomediastinal silhouette. No pneumothorax or pleural effusion is noted. No acute pulmonary disease is noted. Old lower thoracic vertebral body fracture is noted. No acute bony abnormality is noted. IMPRESSION: No active cardiopulmonary disease. Electronically Signed   By: Marijo Conception M.D.   On: 04/15/2019 12:44   Ct Code Stroke Cta Neck W/wo Contrast  Result Date: 04/15/2019 CLINICAL DATA:  Left-sided weakness EXAM: CT ANGIOGRAPHY HEAD AND NECK CT PERFUSION BRAIN TECHNIQUE: Multidetector CT imaging of the head and neck was performed using the standard protocol during bolus administration of intravenous contrast. Multiplanar CT image reconstructions and MIPs were obtained to evaluate the vascular anatomy. Carotid stenosis measurements (when applicable) are obtained utilizing NASCET criteria, using the distal internal carotid diameter as the denominator. Multiphase CT imaging of the brain was performed following IV bolus contrast injection. Subsequent parametric perfusion maps were calculated using RAPID software. CONTRAST:  Dose is currently not available COMPARISON:  None. FINDINGS: The scanner has a malfunction such that images are being reviewed on TeraRecon rather than in pacs CTA NECK FINDINGS Aortic arch: No acute finding Right carotid system: Moderate atherosclerotic plaque mainly at the bifurcation. No ulceration or flow  limiting stenosis. Left carotid system: Atherosclerotic plaque to a moderate degree, mainly at the bifurcation. No flow limiting stenosis or ulceration. Vertebral arteries: No proximal subclavian stenosis or ulceration. Moderate narrowing at the right vertebral origin due to atherosclerosis. Skeleton: Cervical spine degeneration with bulky ridging. Other neck: No acute finding Upper chest: Patulous esophagus. Patchy air trapping in the apical lungs. Review of the MIP images confirms the above findings CTA HEAD FINDINGS Anterior circulation: Right M1 occlusion, expected based on prior head CT. The right A1 is patent. No additional embolism is seen. Poor collaterals at the arterial phase. Posterior circulation: Widely patent vertebral arteries. Mild-to-moderate atheromatous irregularity of the basilar. Extensive atherosclerotic narrowing of the bilateral posterior cerebral arteries with high-grade proximal stenoses. Venous sinuses: Not well assessed on this arterial study Anatomic variants: None significant Delayed phase: Not obtained Review of the MIP images confirms the above findings CT Brain Perfusion Findings: ASPECTS: 3 CBF (<30%) Volume: 176mL Perfusion (Tmax>6.0s) volume: 151mL Mismatch Volume: 3mL Infarction Location:Right MCA territory IMPRESSION: 1. 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 2. No flow limiting stenosis or embolic source seen in the right ICA. 3. Advanced atherosclerosis of the bilateral posterior cerebral arteries. There are poor collaterals along the cerebral convexities. Electronically Signed   By: Monte Fantasia M.D.   On: 04/15/2019 11:13   Ct Code Stroke Cta Cerebral Perfusion W/wo Contrast  Result Date: 04/15/2019 CLINICAL DATA:  Left-sided weakness EXAM: CT ANGIOGRAPHY HEAD AND NECK CT PERFUSION BRAIN TECHNIQUE: Multidetector CT imaging of the head and neck was performed using the standard protocol  during bolus administration of intravenous contrast. Multiplanar CT image reconstructions and MIPs were obtained to evaluate the vascular anatomy. Carotid stenosis measurements (when applicable) are obtained utilizing NASCET criteria, using the distal internal carotid diameter as the denominator. Multiphase CT imaging of the brain was performed following IV bolus contrast injection. Subsequent parametric perfusion maps were calculated using RAPID software. CONTRAST:  Dose is currently not available COMPARISON:  None.  FINDINGS: The scanner has a malfunction such that images are being reviewed on TeraRecon rather than in pacs CTA NECK FINDINGS Aortic arch: No acute finding Right carotid system: Moderate atherosclerotic plaque mainly at the bifurcation. No ulceration or flow limiting stenosis. Left carotid system: Atherosclerotic plaque to a moderate degree, mainly at the bifurcation. No flow limiting stenosis or ulceration. Vertebral arteries: No proximal subclavian stenosis or ulceration. Moderate narrowing at the right vertebral origin due to atherosclerosis. Skeleton: Cervical spine degeneration with bulky ridging. Other neck: No acute finding Upper chest: Patulous esophagus. Patchy air trapping in the apical lungs. Review of the MIP images confirms the above findings CTA HEAD FINDINGS Anterior circulation: Right M1 occlusion, expected based on prior head CT. The right A1 is patent. No additional embolism is seen. Poor collaterals at the arterial phase. Posterior circulation: Widely patent vertebral arteries. Mild-to-moderate atheromatous irregularity of the basilar. Extensive atherosclerotic narrowing of the bilateral posterior cerebral arteries with high-grade proximal stenoses. Venous sinuses: Not well assessed on this arterial study Anatomic variants: None significant Delayed phase: Not obtained Review of the MIP images confirms the above findings CT Brain Perfusion Findings: ASPECTS: 3 CBF (<30%) Volume: 145mL  Perfusion (Tmax>6.0s) volume: 181mL Mismatch Volume: 27mL Infarction Location:Right MCA territory IMPRESSION: 1. 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 2. No flow limiting stenosis or embolic source seen in the right ICA. 3. Advanced atherosclerosis of the bilateral posterior cerebral arteries. There are poor collaterals along the cerebral convexities. Electronically Signed   By: Monte Fantasia M.D.   On: 04/15/2019 11:13   Ct Head Code Stroke Wo Contrast  Result Date: 04/15/2019 CLINICAL DATA:  Code stroke. Left-sided weakness. Right facial droop EXAM: CT HEAD WITHOUT CONTRAST TECHNIQUE: Contiguous axial images were obtained from the base of the skull through the vertex without intravenous contrast. COMPARISON:  12/14/2018 FINDINGS: Brain: Cytotoxic edema throughout the large majority of the right MCA territory. High-density appearance of the right caudate head is attributed to preserved gray matter rather than hemorrhage. No acute hemorrhage is seen. Remote right occipital infarct. Chronic small vessel ischemia. No midline shift. Vascular: There may be hyperdense right M1 segment, which would be expected Skull: Negative Sinuses/Orbits: Chronic right maxillary sinusitis with near complete opacification. Other: These results were called by telephone at the time of interpretation on 04/15/2019 at 10:36 am to Dr. Rory Percy, who was already aware PACS is down at time of study and images were reviewed in TeraRecon ASPECTS [facility] Stroke Program Early CT Score) - Ganglionic level infarction (caudate, lentiform nuclei, internal capsule, insula, M1-M3 cortex): 1 - Supraganglionic infarction (M4-M6 cortex): 2 Total score (0-10 with 10 being normal): 3 IMPRESSION: 1. Large acute right MCA territory infarct.  ASPECTS is 3 at best. 2. No acute hemorrhage. Electronically Signed   By: Monte Fantasia M.D.   On: 04/15/2019 10:38     Procedures Procedures (including critical care time)  Medications Ordered in ED Medications  insulin aspart (novoLOG) injection 0-15 Units (has no administration in time range)  enoxaparin (LOVENOX) injection 40 mg (40 mg Subcutaneous Given 04/15/19 1430)  0.9 %  sodium chloride infusion ( Intravenous New Bag/Given 04/15/19 1422)  acetaminophen (TYLENOL) tablet 650 mg (has no administration in time range)    Or  acetaminophen (TYLENOL) solution 650 mg (has no administration in time range)    Or  acetaminophen (TYLENOL) suppository 650 mg (has no administration in time range)  senna-docusate (Senokot-S)  tablet 1 tablet (has no administration in time range)  aspirin suppository 300 mg (300 mg Rectal Given 04/15/19 1431)    Or  aspirin tablet 325 mg ( Oral See Alternative 04/15/19 1431)  insulin aspart (novoLOG) injection 0-5 Units (has no administration in time range)  valproate (DEPACON) 500 mg in dextrose 5 % 50 mL IVPB (500 mg Intravenous New Bag/Given 04/15/19 1433)  valproate (DEPACON) 1,000 mg in dextrose 5 % 50 mL IVPB (has no administration in time range)  iohexol (OMNIPAQUE) 350 MG/ML injection 100 mL (100 mLs Intravenous Contrast Given 04/15/19 1103)  sodium chloride 0.9 % bolus 1,000 mL (1,000 mLs Intravenous New Bag/Given 04/15/19 1153)   stroke: mapping our early stages of recovery book ( Does not apply Given 04/15/19 1434)     Initial Impression / Assessment and Plan / ED Course  I have reviewed the triage vital signs and the nursing notes.  Pertinent labs & imaging results that were available during my care of the patient were reviewed by me and considered in my medical decision making (see chart for details).  Clinical Course as of Apr 14 1542  Fri Apr 15, 2019  1016 Stable compared to baseline  Creatinine(!): 2.00 [MV]  22 CT HEAD CODE STROKE WO CONTRAST [MV]    Clinical Course User Index [MV] Eustaquio Maize, PA-C   73 year old male who presents with AMS, L  sided facial droop, and left arm weakness. Last known normal 11 PM last night. Pt able to withdraw from painful stimuli to left foot; otherwise no movement in left arm. Concern for large vessel occlusion as pt has aphasia currently. Per SNF staff pt is typically verbal and ambulatory. Code stroke initiated at 9:55 AM.   CT Head with large acute MCA infarct; Dr. Malen Gauze with neurology aware. Suggests hospitalist consult for admission as pt not a candidate for IR given severity of stroke.   11:09 AM Discussed case with hospitalist Dr. Lorin Mercy who agrees to accept patient for admission.   11:20 AM Pt's nurse at [facility] able to give contact info for his emergency contact: Briscoe Deutscher [phone]. Attempted to contact but was unable to reach her at this time.        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

## 2019-04-16 ENCOUNTER — Inpatient Hospital Stay (HOSPITAL_COMMUNITY): Payer: Medicare Other

## 2019-04-16 DIAGNOSIS — L8990 Pressure ulcer of unspecified site, unspecified stage: Secondary | ICD-10-CM | POA: Insufficient documentation

## 2019-04-16 DIAGNOSIS — I509 Heart failure, unspecified: Secondary | ICD-10-CM

## 2019-04-16 DIAGNOSIS — Z515 Encounter for palliative care: Secondary | ICD-10-CM

## 2019-04-16 LAB — GLUCOSE, CAPILLARY
Glucose-Capillary: 124 mg/dL — ABNORMAL HIGH (ref 70–99)
Glucose-Capillary: 127 mg/dL — ABNORMAL HIGH (ref 70–99)
Glucose-Capillary: 133 mg/dL — ABNORMAL HIGH (ref 70–99)
Glucose-Capillary: 160 mg/dL — ABNORMAL HIGH (ref 70–99)
Glucose-Capillary: 162 mg/dL — ABNORMAL HIGH (ref 70–99)
Glucose-Capillary: 162 mg/dL — ABNORMAL HIGH (ref 70–99)
Glucose-Capillary: 167 mg/dL — ABNORMAL HIGH (ref 70–99)

## 2019-04-16 LAB — HEMOGLOBIN A1C
Hgb A1c MFr Bld: 6.3 % — ABNORMAL HIGH (ref 4.8–5.6)
Mean Plasma Glucose: 134.11 mg/dL

## 2019-04-16 LAB — LIPID PANEL
Cholesterol: 188 mg/dL (ref 0–200)
HDL: 26 mg/dL — ABNORMAL LOW (ref >40)
LDL Cholesterol: 129 mg/dL — ABNORMAL HIGH (ref 0–99)
Total CHOL/HDL Ratio: 7.2 RATIO
Triglycerides: 167 mg/dL — ABNORMAL HIGH (ref <150)
VLDL: 33 mg/dL (ref 0–40)

## 2019-04-16 MED ORDER — DEXTROSE-NACL 5-0.9 % IV SOLN
INTRAVENOUS | Status: DC
  Administered 2019-04-16 – 2019-04-17 (×2): via INTRAVENOUS

## 2019-04-16 NOTE — Progress Notes (Signed)
Pt has left the unit for MRI

## 2019-04-16 NOTE — Progress Notes (Signed)
STROKE TEAM PROGRESS NOTE   HISTORY OF PRESENT ILLNESS (per record) William [name] is a 73 y.o. male with history of dementia, seizures, DM type 2, HTN, HLD, CKD stg 3, and hx of etoh abuse presents from [facility] via EMS for AMS. Patient's LKN was at the latest 2300 last night, per facility nurse/staff was noted to have R facial droop, LHB plegia, and not following commands this morning.  On assessment, patient was is non-verbal, unable to follow commands, severely paretic on the L hemibody, and spontaneously anti-gravity on R hemibody. He also has R facial droop with R side gaze preference, unable to cross midline.  Multiple attempts to reach family members listed on the chart were unsuccessful  ED course CT head significant for large R MCA infarct. CTAP with 179cc penumbra, mismatch 47cc.   Chart review: Managed by [facility] outpatient for seizures, takes Depakote DR 500 mg TID and Keppra 250 mg BID. Cr 2.3-2.6 at baseline. Gait disturbance at baseline, uses wheelchair to mobilize, can stand but unable to effectively walk.  LKN: 04/14/2019 2300 tpa given?: no, outside of window Premorbid modified Rankin scale (mRS): 4-Needs assistance to walk and tend to bodily needs   SUBJECTIVE (INTERVAL HISTORY) His nurse is at the bedside.  He remains minimally responsive and is unable to swallow or eat.  Family still deciding on goals of care    OBJECTIVE Vitals:   04/15/19 2200 04/16/19 0001 04/16/19 0041 04/16/19 0418  BP: (!) 155/99 (!) 160/106 (!) 154/104 (!) 160/103  Pulse:      Resp:   17   Temp:   98.3 F (36.8 C) 99 F (37.2 C)  TempSrc:   Axillary Axillary  SpO2:   100%   Weight:      Height:        CBC:  Recent Labs  Lab 04/15/19 0952 04/15/19 1010  WBC 8.6  --   NEUTROABS 7.2  --   HGB 14.3 15.3  HCT 45.2 45.0  MCV 89.3  --   PLT 256  --     Basic Metabolic Panel:  Recent Labs  Lab 04/15/19 0952 04/15/19 1010  NA 139 139  K 5.1 4.7   CL 104 105  CO2 22  --   GLUCOSE 189* 183*  BUN 30* 30*  CREATININE 2.18* 2.00*  CALCIUM 9.5  --     Lipid Panel:     Component Value Date/Time   CHOL 188 04/16/2019 0452   TRIG 167 (H) 04/16/2019 0452   HDL 26 (L) 04/16/2019 0452   CHOLHDL 7.2 04/16/2019 0452   VLDL 33 04/16/2019 0452   LDLCALC 129 (H) 04/16/2019 0452   HgbA1c:  Lab Results  Component Value Date   HGBA1C 6.3 (H) 04/16/2019   Urine Drug Screen:     Component Value Date/Time   LABOPIA NONE DETECTED 04/15/2019 1429   COCAINSCRNUR NONE DETECTED 04/15/2019 1429   LABBENZ NONE DETECTED 04/15/2019 1429   AMPHETMU NONE DETECTED 04/15/2019 1429   THCU NONE DETECTED 04/15/2019 1429   LABBARB NONE DETECTED 04/15/2019 1429    Alcohol Level     Component Value Date/Time   ETH <10 04/15/2019 0952    IMAGING  Ct Code Stroke Cta Cerebral Perfusion W/wo Contrast Ct Code Stroke Cta Head W/wo Contrast Ct Code Stroke Cta Neck W/wo Contrast 04/15/2019 IMPRESSION:  1. 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  2. No  flow limiting stenosis or embolic source seen in the right ICA.  3. Advanced atherosclerosis of the bilateral posterior cerebral arteries. There are poor collaterals along the cerebral convexities.     Dg Chest 2 View 04/15/2019 IMPRESSION: No active cardiopulmonary disease.    Mr Brain Wo Contrast 04/16/2019 IMPRESSION:  1. Large evolving acute ischemic right MCA/ACA territory infarcts involving the right cerebral hemisphere as above. Associated regional mass effect with early 2 mm right-to-left midline shift. No hydrocephalus or ventricular trapping.  2. Associated scattered petechial hemorrhage throughout the area of infarction without frank hemorrhagic transformation.  3. Underlying age-related cerebral atrophy with moderate chronic microvascular ischemic disease.    Ct Head Code Stroke Wo  Contrast 04/15/2019 IMPRESSION:  1. Large acute right MCA territory infarct.  ASPECTS is 3 at best.  2. No acute hemorrhage.    Transthoracic Echocardiogram  04/15/2019 IMPRESSIONS  1. The left ventricle has severely reduced systolic function, with an ejection fraction of 25-30%. The cavity size was normal. There is mild concentric left ventricular hypertrophy. Left ventricular diastolic Doppler parameters are consistent with pseudonormalization. Elevated mean left atrial pressure Left ventrical global hypokinesis without regional wall motion abnormalities.  2. The right ventricle has moderately reduced systolic function. The cavity was mildly enlarged. There is no increase in right ventricular wall thickness. Right ventricular systolic pressure could not be assessed.  3. Left atrial size was mildly dilated.  4. Right atrial size was mildly dilated.  5. The aortic root is normal in size and structure.   EKG - SR rate 93BPM. (See cardiology reading for complete details)   PHYSICAL EXAM Blood pressure (!) 160/103, pulse 74, temperature 99 F (37.2 C), temperature source Axillary, resp. rate 17, height 6' (1.829 m), weight 90.7 kg, SpO2 100 %. Frail elderly Caucasian male not in distress. . Afebrile. Head is nontraumatic. Neck is supple without bruit.    Cardiac exam no murmur or gallop. Lungs are clear to auscultation. Distal pulses are well felt.  Neurological Exam :  Patient is stuporous and very poorly responsive.  Eyes are closed.  He opens eyes partially to sternal rub.  He is nonverbal and globally aphasic and not following commands.  He mumbles occasional guttural sounds.  Right gaze deviation.  Unable to look to the left.  Does not blink to threat on either side.  Pupils equal reactive.  Fundi not visualized.  Left lower facial weakness.  Tongue midline.  Dense left hemiplegia with only trace withdrawal in the lower leg lower extremity to painful stimuli.  Some spontaneous antigravity  movements on the right side with brisk withdrawal to pain.  Right plantars downgoing left upgoing.      ASSESSMENT/PLAN William [name] is a 73 y.o. male with history of dementia, seizures, DM type 2, HTN, HLD, HOH, CKD stg 3, and hx of etoh abuse presenting non-verbal, unable to follow commands, severely paretic on the L hemibody, spontaneously anti-gravity on R hemibody, R facial droop, R side gaze preference and unable to cross midline. Marland Kitchen He did not receive IV t-PA due to late presentation (>4.5 hours from time of onset).  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  ASPECTS is 3 at best.   MRI head -  Large evolving acute ischemic right MCA/ACA territory infarcts involving the right cerebral hemisphere - regional mass effect with early 2 mm  right-to-left midline shift. No hydrocephalus or ventricular trapping. Associated scattered petechial hemorrhage throughout the area of infarction without frank hemorrhagic transformation.   MRA head - not performed  CTA H&N - Right M1 occlusion with large completed infarct in the right MCA territory based on CT perfusion and noncontrast head CT.   Carotid Doppler - CTA neck performed - carotid dopplers not indicated.  2D Echo  - EF 25-30%. No other cardiac source of emboli identified.   Sars Corona Virus 2  - negative  LDL - 129  HgbA1c - 6.3  UDS - negative  VTE prophylaxis - Lovenox  Diet - NPO  No antithrombotic prior to admission, now on aspirin 325 mg daily  Patient counseled to be compliant with his antithrombotic medications  Ongoing aggressive stroke risk factor management  Therapy recommendations:  pending  Disposition:  Pending  Hypertension  Stable . Permissive hypertension (OK if < 220/120) but gradually normalize in 5-7 days . Long-term BP goal  normotensive  Hyperlipidemia  Lipid lowering medication PTA:  none  LDL 129, goal < 70  Current lipid lowering medication: NPO - consider high dose statin when taking POs  Continue statin at discharge  Diabetes  HgbA1c 6.3, goal < 7.0  Controlled  Other Stroke Risk Factors  Advanced age  Former cigarette smoker - quit  Overweight, Body mass index is 27.12 kg/m., recommend weight loss, diet and exercise as appropriate   ETOH history  EF 25-30%  Other Active Problems  Seizure history - Depakote PTA and now   CKD - Cr - 2.0  Low grade temp - 99 axillary - WBCs - 8.6  EF 25-30%  PLAN  Palliative Care consult - consider comfort or [facility] day # 1  I have personally obtained history,examined this patient, reviewed notes, independently viewed imaging studies, participated in medical decision making and plan of care.ROS completed by me personally and pertinent positives fully documented  I have made any additions or clarifications directly to the above note.  Patient has had a large right hemispheric infarct and his prognosis for surviving this without significant disability and prolonged nursing home care and need for feeding tube is extremely unlikely.  Agree with DNR and continue attempts to reach the family to discuss goals of care-feeding tube versus comfort care.  Discussed with Dr. Tawanna Solo.  Greater than 50% time during this 25-minute visit was spent on counseling and coordination of care and discussion with care team and answering questions.  Stroke team will sign off.  Kindly call for questions.  Antony Contras, MD Medical Director [facility] Pager: [phone] 04/16/2019 1:10 PM   To contact Stroke Continuity provider, please refer to [email or web address] After hours, contact General Neurology

## 2019-04-16 NOTE — Evaluation (Signed)
Occupational Therapy Evaluation Patient Details Name: William [name] MRN: [id number] DOB: May 09, 1946 Today's Date: 04/16/2019    History of Present Illness Patient is a 73 y/o male presenting to the ED on 04/15/2019 with R facial droop and L sided weakness - CT head significant for large R MCA infarct. Past medical history of dementia, seizures, DM type 2, HTN, HLD, CKD stg 3, and hx of etoh abuse.    Clinical Impression   PTA: pt from SNF. Pt currently performing mobility moving towards HOB lifting bottom off of bed slightly with totalA+2.Pt with inattention/neglect to L side. Pt does not react to painful stimulus in L hand, but reacts to L leg painful stimulus. Pt limited by massive CVA affecting command follow, L side weakness and L side neglect/inattention with R visual gaze preference. Pt totalA for mobility and ADL at this time. RUE in use, but unable to assist due to coordination and vision deficits even with handover hand techniques. Pt would greatly benefit from continued OT skilled services for ADL, mobility and safety in SNF setting. OT following acutely.    Follow Up Recommendations  SNF;Supervision/Assistance - 24 hour    Equipment Recommendations  None recommended by OT    Recommendations for Other Services       Precautions / Restrictions Precautions Precautions: Fall Restrictions Weight Bearing Restrictions: No      Mobility Bed Mobility Overal bed mobility: Needs Assistance Bed Mobility: Supine to Sit;Sit to Supine     Supine to sit: Max assist;Total assist;+2 for physical assistance Sit to supine: Max assist;Total assist;+2 for physical assistance   General bed mobility comments: Max/Total A +2 for bed level mobility with Max A to maintain upright at EOB due to pushing towards L with heavy posterior/L Lateral lean  Transfers                 General transfer comment: attempted to stand with Max/Total A +2 - only able to minimally clear buttocks and  scoot towards HOB x 3    Balance Overall balance assessment: Needs assistance Sitting-balance support: Bilateral upper extremity supported;Feet supported Sitting balance-Leahy Scale: Zero                                     ADL either performed or assessed with clinical judgement   ADL Overall ADL's : Needs assistance/impaired Eating/Feeding: Total assistance   Grooming: Total assistance Grooming Details (indicate cue type and reason): unable to remove washcloth from head with OTR assisting Upper Body Bathing: Total assistance   Lower Body Bathing: Total assistance   Upper Body Dressing : Total assistance   Lower Body Dressing: Total assistance   Toilet Transfer: Total assistance   Toileting- Clothing Manipulation and Hygiene: Total assistance       Functional mobility during ADLs: Total assistance;+2 for physical assistance;+2 for safety/equipment General ADL Comments: Pt limited by massive CVA affecting command follow, L side weakness and L side neglect/inattention with R visual gaze preference.     Vision Patient Visual Report: Other (comment)(R gaze preference) Vision Assessment?: No apparent visual deficits     Perception     Praxis      Pertinent Vitals/Pain Pain Assessment: Faces Pain Score: 0-No pain Faces Pain Scale: No hurt     Hand Dominance (uncertain, L side is with trace muscles)   Extremity/Trunk Assessment Upper Extremity Assessment Upper Extremity Assessment: Generalized weakness;RUE deficits/detail;LUE deficits/detail RUE Deficits /  Details: decreased strength 2-/5 MM grade and strong grip RUE Coordination: decreased gross motor LUE Deficits / Details: 1/5 MM grade, trace muscles and tone noted- not completely flaccid. LUE Coordination: decreased fine motor;decreased gross motor   Lower Extremity Assessment Lower Extremity Assessment: Defer to PT evaluation RLE Deficits / Details: difficult to assess as he does not follow  commands LLE Deficits / Details: does not attempt to move   Cervical / Trunk Assessment Cervical / Trunk Assessment: Kyphotic   Communication Communication Communication: Expressive difficulties   Cognition Arousal/Alertness: Lethargic Behavior During Therapy: Flat affect Overall Cognitive Status: Difficult to assess                                     General Comments  able to nod appropriately ~25% of time.    Exercises     Shoulder Instructions      Home Living Family/patient expects to be discharged to:: Skilled nursing facility                                 Additional Comments: unable to state PLOF      Prior Functioning/Environment Level of Independence: Needs assistance  Gait / Transfers Assistance Needed: Poor historian. Reports use of rollator and/or wheelchair, unclear of when he uses which ADL's / Homemaking Assistance Needed: per chart, pt was feeding self   Comments: per chart "uses wheelchair to mobilize, can stand but unable to effectively walk."        OT Problem List: Decreased strength;Decreased activity tolerance;Impaired balance (sitting and/or standing);Impaired vision/perception;Decreased coordination;Decreased safety awareness;Impaired UE functional use;Pain      OT Treatment/Interventions: Self-care/ADL training;Therapeutic exercise;Neuromuscular education;Energy conservation;Therapeutic activities;Patient/family education;Balance training;Visual/perceptual remediation/compensation    OT Goals(Current goals can be found in the care plan section) Acute Rehab OT Goals Patient Stated Goal: none stated OT Goal Formulation: With patient Time For Goal Achievement: 04/29/19 Potential to Achieve Goals: Fair ADL Goals Pt Will Perform Eating: with set-up;with supervision;bed level;sitting Pt Will Perform Grooming: with min guard assist;sitting;bed level Pt Will Transfer to Toilet: with mod assist;with +2 assist;squat  pivot transfer;bedside commode Additional ADL Goal #1: Pt will sit EOB for x5 mins with intermittent assist for stability and to practice turning head beyond midline.  OT Frequency: Min 2X/week   Barriers to D/C:            Co-evaluation PT/OT/SLP Co-Evaluation/Treatment: Yes Reason for Co-Treatment: Complexity of the patient's impairments (multi-system involvement);For patient/therapist safety PT goals addressed during session: Mobility/safety with mobility;Balance OT goals addressed during session: ADL's and self-care      AM-PAC OT "6 Clicks" Daily Activity     Outcome Measure Help from another person eating meals?: Total Help from another person taking care of personal grooming?: Total Help from another person toileting, which includes using toliet, bedpan, or urinal?: Total Help from another person bathing (including washing, rinsing, drying)?: Total Help from another person to put on and taking off regular upper body clothing?: Total Help from another person to put on and taking off regular lower body clothing?: Total 6 Click Score: 6   End of Session Equipment Utilized During Treatment: Gait belt Nurse Communication: Mobility status  Activity Tolerance: Patient limited by fatigue;Patient limited by lethargy;Treatment limited secondary to medical complications (Comment) Patient left: in bed;with call bell/phone within reach;with bed alarm set  OT Visit Diagnosis: Muscle  weakness (generalized) (M62.81);Other symptoms and signs involving cognitive function;Hemiplegia and hemiparesis Hemiplegia - Right/Left: Left Hemiplegia - dominant/non-dominant: Non-Dominant Hemiplegia - caused by: Cerebral infarction                Time: 1610-9604 OT Time Calculation (min): 26 min Charges:  OT General Charges $OT Visit: 1 Visit OT Evaluation $OT Eval Moderate Complexity: 1 Mod  Darryl Nestle) Marsa Aris OTR/L Acute Rehabilitation Services Pager: [phone] Office:  [place] 04/16/2019, 12:13 PM

## 2019-04-16 NOTE — Evaluation (Signed)
Clinical/Bedside Swallow Evaluation Patient Details  Name: William [name] MRN: [id number] Date of Birth: 1946/07/04  Today's Date: 04/16/2019 Time: SLP Start Time (ACUTE ONLY): 2440 SLP Stop Time (ACUTE ONLY): 0825 SLP Time Calculation (min) (ACUTE ONLY): 30 min  Past Medical History:  Past Medical History:  Diagnosis Date  . Abnormality of gait 01/14/2016  . Alcohol abuse 2009   dependency as exhibited by ETOH withdrawal seizure.   . Alcohol withdrawal seizure ([place]) 2013  . Anemia 2011   Normocytic.   . Cataract   . Chronic renal insufficiency, stage III (moderate) ([place]) 02/26/2014  . Depression with anxiety   . Hearing impairment   . HTN (hypertension), benign 02/26/2014  . Hyperlipemia   . Seizures ([place])   . Type II or unspecified type diabetes mellitus with neurological manifestations, not stated as uncontrolled(250.60) 02/26/2014   Past Surgical History:  Past Surgical History:  Procedure Laterality Date  . NO PAST SURGERIES     HPI:  73 yo with h/o ETOH use, cervical spondylosis, cerebellar atrophy adm with 73 y.o. male  with past medical history of dementia, seizure do, ETOH abuse, CKD 3, DM4, LVEF 40% who was admitted from SNF on 04/15/2019 with altered mental status, left sided weakness and facial droop.  Imaging revealed a large MCA CVA with shift. Palliative noted indicates that neuro states pt will be extremely disabled.  Palliative Medicine was consulted for goals of care.   Assessment / Plan / Recommendation Clinical Impression  Patient currently presents with signs of gross oropharyngeal dysphagia due to CN deficits and inattention/decreased awareness from CVA.  Multiple cranial nerves impacted including V, VII, XII, and likley IX- some appear with bilateral involvement but pt is also grossly weak and has early 2 mm midline shift from right to left on MRI as well as h/o atrophy.  Moderate amount of yellow tinged viscous secretions adhered in oral cavity removed with  aggressive oral care - SLP set up oral suction for RN to use with pt.    He did not follow commands and has obvious inattention and/or vision deficits to the left.  Single small boluses of thin water after oral care were largely orally held with anteiror loss (right and bilateral) requiring SLP to orally suction.  He did elicit swallow x2 of 6 water tsp boluses.  Small amount of applesauce provided with pt demonstrating poor awareness and no manipulation - thus orally suctioned.  Of note, pt later in BSE did present with reflexive cough x2 after short duration of wheeze/congestion - ? secretion aspiration.  No further cough conducted despite encouragement as pt is mute at this time.     Given severity of pt's dysphagia, doubtful his swallow will return to functional level during this hospital course. SLP Visit Diagnosis: Dysphagia, oropharyngeal phase (R13.12)    Aspiration Risk  Severe aspiration risk;Risk for inadequate nutrition/hydration    Diet Recommendation NPO(adequate oral care needed)   Medication Administration: Via alternative means    Other  Recommendations Oral Care Recommendations: Oral care QID   Follow up Recommendations        Frequency and Duration min 2x/week  1 week       Prognosis Prognosis for Safe Diet Advancement: Guarded Barriers to Reach Goals: Severity of deficits      Swallow Study   General Date of Onset: 04/16/19 HPI: 73 yo with h/o ETOH use, cervical spondylosis, cerebellar atrophy adm with 73 y.o. male  with past medical history of dementia, seizure do,  ETOH abuse, CKD 3, DM4, LVEF 40% who was admitted from SNF on 04/15/2019 with altered mental status, left sided weakness and facial droop.  Imaging revealed a large MCA CVA with shift. Palliative noted indicates that neuro states pt will be extremely disabled.  Palliative Medicine was consulted for goals of care. Type of Study: Bedside Swallow Evaluation Previous Swallow Assessment: none in epic Diet  Prior to this Study: NPO Temperature Spikes Noted: No Respiratory Status: Room air History of Recent Intubation: No Behavior/Cognition: Lethargic/Drowsy Oral Cavity Assessment: Dry;Dried secretions Oral Care Completed by SLP: Yes Oral Cavity - Dentition: Edentulous Vision: Impaired for self-feeding Self-Feeding Abilities: Total assist Patient Positioning: Upright in bed Baseline Vocal Quality: Not observed;Suspected CN X (Vagus) involvement Volitional Cough: Cognitively unable to elicit Volitional Swallow: Unable to elicit    Oral/Motor/Sensory Function Overall Oral Motor/Sensory Function: Severe impairment Facial ROM: Reduced left;Reduced right Facial Symmetry: Abnormal symmetry left;Abnormal symmetry right Facial Strength: Reduced left;Reduced right Facial Sensation: Reduced right;Reduced left Lingual ROM: Reduced left;Reduced right Lingual Strength: Reduced;Suspected CN XII (hypoglossal) dysfunction Lingual Sensation: Reduced;Suspected CN VII (facial) dysfunction-anterior 2/3 tongue Velum: Other (comment)(pt without gag response,unable to verbalize to assess vp closure with phonation)   Ice Chips Ice chips: Not tested   Thin Liquid Thin Liquid: Impaired Presentation: Spoon Oral Phase Impairments: Reduced labial seal;Reduced lingual movement/coordination;Poor awareness of bolus Oral Phase Functional Implications: Right anterior spillage;Left anterior spillage;Oral residue;Oral holding;Prolonged oral transit Pharyngeal  Phase Impairments: Suspected delayed Swallow    Nectar Thick Nectar Thick Liquid: Not tested   Honey Thick Honey Thick Liquid: Not tested   Puree Puree: Impaired Presentation: Spoon Oral Phase Impairments: Reduced labial seal;Reduced lingual movement/coordination;Poor awareness of bolus Oral Phase Functional Implications: Right anterior spillage Other Comments: pt did not elicit swallow and SLP removed bolus with suction due to aspiration risk   Solid      Solid: Not tested      Macario Golds 04/16/2019,9:45 AM  Luanna Salk, MS [place] Pager [phone] Office [phone]

## 2019-04-16 NOTE — Progress Notes (Signed)
  Palliative medicine progress note  Patient seen, chart reviewed.  Patient opened his eyes when I called his name "William [name]".  He is nonverbal.  He grabs my hand with his right hand but I was not sure whether this was purposeful.  Left upper extremity is extremely weak and he does not grasp my hand with his left hand.  Patient is not taking anything by mouth and is unable to swallow pills  Per chart review, palliative medicine saw [name] in March 2020.  At that point we had been in contact with a purported significant other of 40 years, Briscoe Deutscher regarding goals of care.  Per my coworkers note from 04/15/2019, [facility] skilled nursing facility, where [name] had been living, stated that his daughter, [name] has been their point of contact.  I did attempt to reach her today and left a message.  [name] number is [phone]  He does not appear to be in any distress.  No nonverbal signs and symptoms of pain, no dyspnea, or agitation.  Per review of MAR, no PRN Haldol needed for agitation  Patient Profile: 73 y.o. male  with past medical history of dementia, seizure do, ETOH abuse, CKD 3, DM4, LVEF 25-30% who was admitted from SNF on 04/15/2019 with altered mental status, left sided weakness and facial droop.  Imaging revealed a large MCA CVA..  The patient is currently very minimally responsive.  Per neurology this stroke will definitely leave him extremely disabled.   Palliative Medicine was consulted for goals of care.  At this point we have been unable to connect with family and patient is unable to participate in goals of care secondary to above clinical condition  Plan We will continue to try to reach family to pursue further goals of care specifically artificial feeding in the setting of right MCA infarct, comfort care. If family does elect comfort care, he would qualify for residential hospice  Prognosis Less than 2 weeks given severity of right MCA stroke,  inability to take anything by mouth with associated heart failure, EF 25 to 30% (EF was 40 to 45% in March 2020), comorbidities of chronic kidney disease, seizure disorder, diabetes  Disposition To be determined

## 2019-04-16 NOTE — Progress Notes (Signed)
PT Cancellation Note  Patient Details Name: JAMILL WETMORE MRN: [id number] DOB: 11-26-1945   Cancelled Treatment:    Reason Eval/Treat Not Completed: Active bedrest order Will need updated activity orders prior to PT eval/treat. Will follow.   Lanney Gins, PT, DPT Supplemental Physical Therapist 04/16/19 7:51 AM Pager: [phone] Office: [phone]

## 2019-04-16 NOTE — Evaluation (Signed)
Physical Therapy Evaluation Patient Details Name: William [name] MRN: [id number] DOB: March 07, 1946 Today's Date: 04/16/2019   History of Present Illness  Patient is a 73 y/o male presenting to the ED on 04/15/2019 with R facial droop and L sided weakness - CT head significant for large R MCA infarct. Past medical history of dementia, seizures, DM type 2, HTN, HLD, CKD stg 3, and hx of etoh abuse.     Clinical Impression  Patient presenting with the above listed diagnosis. Patient non-verbal in session with all PMH/PLOF gleaned from chart review. Patient requiring significant physical assist for bed level mobility to into sitting at EOB with Max A to maintain upright sitting. PT to follow acutely to maximize functional mobility as patient tolerates. Will recommend SNF at discharge.     Follow Up Recommendations SNF;Supervision/Assistance - 24 hour    Equipment Recommendations  None recommended by PT    Recommendations for Other Services       Precautions / Restrictions Precautions Precautions: Fall Restrictions Weight Bearing Restrictions: No      Mobility  Bed Mobility Overal bed mobility: Needs Assistance Bed Mobility: Supine to Sit;Sit to Supine     Supine to sit: Max assist;Total assist;+2 for physical assistance Sit to supine: Max assist;Total assist;+2 for physical assistance   General bed mobility comments: Max/Total A +2 for bed level mobility with Max A to maintain upright at EOB due to pushing towards L with heavy posterior/L Lateral lean  Transfers                 General transfer comment: attempted to stand with Max/Total A +2 - only able to minimally clear buttocks and scoot towards HOB x 3  Ambulation/Gait                Stairs            Wheelchair Mobility    Modified Rankin (Stroke Patients Only) Modified Rankin (Stroke Patients Only) Pre-Morbid Rankin Score: Moderately severe disability Modified Rankin: Severe disability      Balance Overall balance assessment: Needs assistance Sitting-balance support: Bilateral upper extremity supported;Feet supported Sitting balance-Leahy Scale: Zero                                       Pertinent Vitals/Pain Pain Assessment: Faces Pain Score: 0-No pain Faces Pain Scale: No hurt    Home Living Family/patient expects to be discharged to:: Skilled nursing facility                 Additional Comments: unable to state PLOF    Prior Function Level of Independence: Needs assistance         Comments: per chart "uses wheelchair to mobilize, can stand but unable to effectively walk."     Hand Dominance   Dominant Hand: (unknown - grips well with R UE)    Extremity/Trunk Assessment   Upper Extremity Assessment Upper Extremity Assessment: Defer to OT evaluation    Lower Extremity Assessment Lower Extremity Assessment: RLE deficits/detail;LLE deficits/detail RLE Deficits / Details: difficult to assess as he does not follow commands LLE Deficits / Details: does not attempt to move    Cervical / Trunk Assessment Cervical / Trunk Assessment: Kyphotic  Communication   Communication: Expressive difficulties  Cognition Arousal/Alertness: Lethargic Behavior During Therapy: Flat affect Overall Cognitive Status: Difficult to assess  General Comments General comments (skin integrity, edema, etc.): able to nod head "yes" when asked if he wanted to try and stand - however unable    Exercises     Assessment/Plan    PT Assessment Patient needs continued PT services  PT Problem List Decreased strength;Decreased activity tolerance;Decreased balance;Decreased mobility;Decreased knowledge of use of DME;Decreased safety awareness;Decreased knowledge of precautions;Decreased cognition;Decreased coordination       PT Treatment Interventions DME instruction;Gait training;Functional mobility  training;Therapeutic activities;Therapeutic exercise;Balance training;Neuromuscular re-education;Cognitive remediation;Patient/family education    PT Goals (Current goals can be found in the Care Plan section)  Acute Rehab PT Goals Patient Stated Goal: none stated PT Goal Formulation: Patient unable to participate in goal setting Time For Goal Achievement: 04/30/19 Potential to Achieve Goals: Fair    Frequency Min 3X/week   Barriers to discharge        Co-evaluation PT/OT/SLP Co-Evaluation/Treatment: Yes Reason for Co-Treatment: Complexity of the patient's impairments (multi-system involvement);Necessary to address cognition/behavior during functional activity;For patient/therapist safety;To address functional/ADL transfers PT goals addressed during session: Mobility/safety with mobility;Balance         AM-PAC PT "6 Clicks" Mobility  Outcome Measure Help needed turning from your back to your side while in a flat bed without using bedrails?: Total Help needed moving from lying on your back to sitting on the side of a flat bed without using bedrails?: Total Help needed moving to and from a bed to a chair (including a wheelchair)?: Total Help needed standing up from a chair using your arms (e.g., wheelchair or bedside chair)?: Total Help needed to walk in hospital room?: Total Help needed climbing 3-5 steps with a railing? : Total 6 Click Score: 6    End of Session Equipment Utilized During Treatment: Gait belt;Oxygen Activity Tolerance: Patient tolerated treatment well Patient left: in bed;with call bell/phone within reach;with bed alarm set Nurse Communication: Mobility status PT Visit Diagnosis: Unsteadiness on feet (R26.81);Other abnormalities of gait and mobility (R26.89);Muscle weakness (generalized) (M62.81)    Time: 7782-4235 PT Time Calculation (min) (ACUTE ONLY): 26 min   Charges:   PT Evaluation $PT Eval Moderate Complexity: 1 Mod           Lanney Gins, PT, DPT Supplemental Physical Therapist 04/16/19 11:09 AM Pager: [phone] Office: [phone]

## 2019-04-16 NOTE — Evaluation (Signed)
Speech Language Pathology Evaluation Patient Details Name: William [name] MRN: [id number] DOB: Jan 16, 1946 Today's Date: 04/16/2019 Time: 7989-2119 SLP Time Calculation (min) (ACUTE ONLY): 15 min  Problem List:  Patient Active Problem List   Diagnosis Date Noted  . Pressure injury of skin 04/16/2019  . CVA (cerebral vascular accident) ([place]) 04/15/2019  . Palliative care encounter   . Altered mental status, unspecified 12/14/2018  . Elevated troponin   . Smokeless tobacco use 12/31/2017  . Dyspepsia 10/20/2017  . Dietary noncompliance 10/20/2017  . COPD mixed type ([place]) 02/17/2017  . Diabetes mellitus due to underlying condition, uncontrolled, with renal complication ([place]) 41/74/0814  . Hyperlipidemia 03/17/2016  . Abnormality of gait 01/14/2016  . Psychosis ([place]) 01/03/2016  . Seizure ([place]) 12/30/2015  . Iron deficiency anemia   . Acute kidney injury superimposed on chronic kidney disease ([place]) 02/26/2014  . CKD (chronic kidney disease) stage 4, GFR 15-29 ml/min ([facility]) 02/26/2014  . Urinary incontinence 02/10/2013  . History of alcohol abuse 12/03/2009  . Anxiety state 09/30/2006  . Depression 09/30/2006  . Hearing impairment 09/30/2006  . Essential hypertension 09/30/2006   Past Medical History:  Past Medical History:  Diagnosis Date  . Abnormality of gait 01/14/2016  . Alcohol abuse 2009   dependency as exhibited by ETOH withdrawal seizure.   . Alcohol withdrawal seizure ([place]) 2013  . Anemia 2011   Normocytic.   . Cataract   . Chronic renal insufficiency, stage III (moderate) ([place]) 02/26/2014  . Depression with anxiety   . Hearing impairment   . HTN (hypertension), benign 02/26/2014  . Hyperlipemia   . Seizures ([place])   . Type II or unspecified type diabetes mellitus with neurological manifestations, not stated as uncontrolled(250.60) 02/26/2014   Past Surgical History:  Past Surgical History:  Procedure Laterality Date  . NO PAST SURGERIES     HPI:  73 yo with  h/o ETOH use, cervical spondylosis, cerebellar atrophy adm with 73 y.o. male  with past medical history of dementia, seizure do, ETOH abuse, CKD 3, DM4, LVEF 40% who was admitted from SNF on 04/15/2019 with altered mental status, left sided weakness and facial droop.  Imaging revealed a large MCA CVA with shift. Palliative noted indicates that neuro states pt will be extremely disabled.  Palliative Medicine was consulted for goals of care.   Assessment / Plan / Recommendation Clinical Impression  Patient currently presents with gross cognitive linguistic deficits with ? left inattention and/or visual field deficits and mutism.  He did not phonate, cough or clear his throat volitionally to SLP despite max verbal/visual and tactile cues.  He was oriented to self via head nod and correctly.  Y/N questions for basic environment, current president, gender and personal were correct 5/9 questions.  SLP would recommend to maximize communicating with yes/no questions.  He did not point to his written name with choice of two.  But was able to nod correctly yes/no to verbal and visual choice of two immediately after reviewed.  Pt is grossly impaired and will benefit from skilled SLP for basic communication with goals of yes/no appropriate nods for needs.    SLP Assessment  SLP Recommendation/Assessment: Patient needs continued Speech Lanaguage Pathology Services SLP Visit Diagnosis: Aphasia (R47.01);Cognitive communication deficit (R41.841);Aphonia (R49.1);Dysarthria and anarthria (R47.1) Attention and concentration deficit following: Cerebral infarction    Follow Up Recommendations  Skilled Nursing facility(TBD dependent on GOC)    Frequency and Duration min 2x/week  2 weeks      SLP Evaluation Cognition  Overall Cognitive Status: Impaired/Different from baseline Arousal/Alertness: Awake/alert Orientation Level: Oriented to person;Disoriented to situation;Disoriented to time;Disoriented to  place Attention: Focused Focused Attention: Impaired Focused Attention Impairment: Functional basic Awareness: Impaired Problem Solving: Impaired Problem Solving Impairment: Functional basic Safety/Judgment: Impaired       Comprehension  Auditory Comprehension Overall Auditory Comprehension: Impaired Yes/No Questions: Impaired Basic Biographical Questions: 76-100% accurate Basic Immediate Environment Questions: 50-74% accurate Commands: Impaired One Step Basic Commands: 0-24% accurate Interfering Components: Attention Reading Comprehension Reading Status: Impaired Word level: Impaired(unable to identify name from written choice of 2, Id'd from spoken word with head nod) Sentence Level: Not tested Paragraph Level: Not tested Functional Environmental (signs, name badge): Not tested Interfering Components: Attention;Visual acuity;Right neglect/inattention;Visual perceptual    Expression Expression Primary Mode of Expression: Other (comment)(pt is nonverbal at this time) Verbal Expression Overall Verbal Expression: Impaired Initiation: Impaired Automatic Speech: (did not complete automatic speech tasks with max tactile, visual, verbal cues) Repetition: Impaired Naming: Impairment Responsive: 0-25% accurate Confrontation: Impaired(zero percent) Pragmatics: Unable to assess Interfering Components: Attention Non-Verbal Means of Communication: Other (comment)(pt nods his head yes/no at times appropriately) Written Expression Dominant Hand: (unknown, pt held SLP hand tight with his right hand, suspect reflex only)   Oral / Motor  Oral Motor/Sensory Function Overall Oral Motor/Sensory Function: Severe impairment Facial ROM: Reduced left;Reduced right Facial Symmetry: Abnormal symmetry left;Abnormal symmetry right Facial Strength: Reduced left;Reduced right Facial Sensation: Reduced right;Reduced left Lingual ROM: Reduced left;Reduced right Lingual Strength: Reduced;Suspected  CN XII (hypoglossal) dysfunction Lingual Sensation: Reduced;Suspected CN VII (facial) dysfunction-anterior 2/3 tongue Velum: Other (comment)(pt without gag response,unable to verbalize to assess vp closure with phonation) Motor Speech Overall Motor Speech: Impaired Phonation: Aphonic Articulation: Impaired(absent) Intelligibility: Unable to assess (comment) Motor Planning: Not tested   GO                    Macario Golds 04/16/2019, 10:14 AM  Luanna Salk, MS [facility] SLP Acute Rehab Services Pager [phone] Office [phone]

## 2019-04-16 NOTE — Progress Notes (Signed)
PROGRESS NOTE    William [name]  BOF:[id number] DOB: 07-07-1946 DOA: 04/15/2019 PCP: Patient, No Pcp Per   Brief Narrative:  Patient is a 73 year old male from [facility] with history of dementia, diabetes mellitus, seizure disorder, hypertension, hyperlipidemia, hearing impairment, depression/anxiety, ethanol dependence, CKD stage III who presented with altered mental status.  He was found to have left-sided hemiplegia and left-sided facial droop.  He was comatose on presentation and unable to provide any information.  MRI of the brain showed large right-sided ischemic infarct.  Neurology consulted.  Given the size of the stroke, palliative care was consulted regarding goals of discussion due to  poor prognosis. Mental status might have slightly improved this morning.  He tries to obey commands.   Assessment & Plan:   Principal Problem:   CVA (cerebral vascular accident) [facility]) Active Problems:   Essential hypertension   CKD (chronic kidney disease) stage 4, GFR 15-29 ml/min (HCC)   Seizure ([place])   Hyperlipidemia   Diabetes mellitus due to underlying condition, uncontrolled, with renal complication (HCC)   Pressure injury of skin   Acute on chronic congestive heart failure ([place])   Palliative care by specialist   Acute CVA: Presented with altered mental status, left hemiparesis, left facial droop. MRI showed large evolving acute ischemic right MCA/ACA territory infarcts involving the right cerebral hemisphere as above. Associated regional mass effect with early 2 mm right-to-left midline shift. Echo showed reduced ejection fraction of 25 to 30%.  His ejection fraction was 40 to 45% as per echocardiogram on 12/15/2018.  Given his poor prognosis, I do not think we need to consult cardiology.  He is not a candidate for any kind of intervention. Continue aspirin. PT/OT consulted.  Recommended skilled nursing facility on discharge.  Speech recommend n.p.o.  status Given the size of stroke, palliative care following.  He is a candidate for hospice/comfort care.  Currently DNR.  Trying to reach family for the decision of hospice/comfort care initiation.  Stage IV CKD: CKD might have progressed since 2019.  Stable since 11/2018. His creatinine in 12/2017 was 1.2.  Hypertension: Allow permissive hypertension.  Use PRN meds if blood pressure more than 220/120 mmHg  Hyperlipidemia: LDL of 129.Continue Lipitor  Diabetes mellitus: HbA1c of 6.5..  On Toujeo before.  Continue sliding's insulin for now.  Seizure disorder: On Depakote.  Transition to IV because he is unable to take p.o.     Nutrition Problem: Inadequate oral intake Etiology: inability to eat      DVT prophylaxis: Lovenox Code Status: DNR Family Communication: None Disposition Plan: Undetermined at this point  Consultants: Neurology, palliative care  Procedures: MRI  Antimicrobials:  Anti-infectives (From admission, onward)   None      Subjective:  Patient seen and examined the bedside this morning.  Hemodynamically stable currently.  Lying on the bed, eyes closed, sleepy/drowsy.  Tries to follows command when asked.  Does not speak.  Objective: Vitals:   04/15/19 2200 04/16/19 0001 04/16/19 0041 04/16/19 0418  BP: (!) 155/99 (!) 160/106 (!) 154/104 (!) 160/103  Pulse:      Resp:   17   Temp:   98.3 F (36.8 C) 99 F (37.2 C)  TempSrc:   Axillary Axillary  SpO2:   100%   Weight:      Height:        Intake/Output Summary (Last 24 hours) at 04/16/2019 1116 Last data filed at 04/16/2019 0456 Gross per 24 hour  Intake 647.73  ml  Output 300 ml  Net 347.73 ml   Filed Weights   04/15/19 0938  Weight: 90.7 kg    Examination:  General exam: Generalized weakness, extremely debilitated, chronically ill looking HEENT: Left facial droop, ear/Nose normal on gross exam Respiratory system: Bilateral equal air entry, normal vesicular breath sounds, no wheezes or  crackles  Cardiovascular system: S1 & S2 heard, RRR. No JVD, murmurs, rubs, gallops or clicks. No pedal edema. Gastrointestinal system: Abdomen is nondistended, soft and nontender. No organomegaly or masses felt. Normal bowel sounds heard. Central nervous system: Not alert and oriented.  Dense hemiplegia on the left side.   Extremities: No edema, no clubbing ,no cyanosis, distal peripheral pulses palpable. Skin: No rashes, lesions or ulcers,no icterus ,no pallor   Data Reviewed: I have personally reviewed following labs and imaging studies  CBC: Recent Labs  Lab 04/15/19 0952 04/15/19 1010  WBC 8.6  --   NEUTROABS 7.2  --   HGB 14.3 15.3  HCT 45.2 45.0  MCV 89.3  --   PLT 256  --    Basic Metabolic Panel: Recent Labs  Lab 04/15/19 0952 04/15/19 1010  NA 139 139  K 5.1 4.7  CL 104 105  CO2 22  --   GLUCOSE 189* 183*  BUN 30* 30*  CREATININE 2.18* 2.00*  CALCIUM 9.5  --    GFR: Estimated Creatinine Clearance: 35 mL/min (A) (by C-G formula based on SCr of 2 mg/dL (H)). Liver Function Tests: Recent Labs  Lab 04/15/19 0952  AST 22  ALT 19  ALKPHOS 59  BILITOT 1.1  PROT 6.8  ALBUMIN 3.8   No results for input(s): LIPASE, AMYLASE in the last 168 hours. No results for input(s): AMMONIA in the last 168 hours. Coagulation Profile: Recent Labs  Lab 04/15/19 0952  INR 1.3*   Cardiac Enzymes: No results for input(s): CKTOTAL, CKMB, CKMBINDEX, TROPONINI in the last 168 hours. BNP (last 3 results) No results for input(s): PROBNP in the last 8760 hours. HbA1C: Recent Labs    04/16/19 0452  HGBA1C 6.3*   CBG: Recent Labs  Lab 04/15/19 1700 04/15/19 2058 04/16/19 0011 04/16/19 0409 04/16/19 0753  GLUCAP 192* 160* 133* 162* 160*   Lipid Profile: Recent Labs    04/16/19 0452  CHOL 188  HDL 26*  LDLCALC 129*  TRIG 167*  CHOLHDL 7.2   Thyroid Function Tests: No results for input(s): TSH, T4TOTAL, FREET4, T3FREE, THYROIDAB in the last 72  hours. Anemia Panel: No results for input(s): VITAMINB12, FOLATE, FERRITIN, TIBC, IRON, RETICCTPCT in the last 72 hours. Sepsis Labs: No results for input(s): PROCALCITON, LATICACIDVEN in the last 168 hours.  Recent Results (from the past 240 hour(s))  SARS Coronavirus 2 (CEPHEID - Performed in [place] hospital lab), Hosp Order     Status: None   Collection Time: 04/15/19 11:01 AM   Specimen: Nasopharyngeal Swab  Result Value Ref Range Status   SARS Coronavirus 2 NEGATIVE NEGATIVE Final    Comment: (NOTE) If result is NEGATIVE SARS-CoV-2 target nucleic acids are NOT DETECTED. The SARS-CoV-2 RNA is generally detectable in upper and lower  respiratory specimens during the acute phase of infection. The lowest  concentration of SARS-CoV-2 viral copies this assay can detect is 250  copies / mL. A negative result does not preclude SARS-CoV-2 infection  and should not be used as the sole basis for treatment or other  patient management decisions.  A negative result may occur with  improper specimen collection /  handling, submission of specimen other  than nasopharyngeal swab, presence of viral mutation(s) within the  areas targeted by this assay, and inadequate number of viral copies  (<250 copies / mL). A negative result must be combined with clinical  observations, patient history, and epidemiological information. If result is POSITIVE SARS-CoV-2 target nucleic acids are DETECTED. The SARS-CoV-2 RNA is generally detectable in upper and lower  respiratory specimens dur ing the acute phase of infection.  Positive  results are indicative of active infection with SARS-CoV-2.  Clinical  correlation with patient history and other diagnostic information is  necessary to determine patient infection status.  Positive results do  not rule out bacterial infection or co-infection with other viruses. If result is PRESUMPTIVE POSTIVE SARS-CoV-2 nucleic acids MAY BE PRESENT.   A presumptive  positive result was obtained on the submitted specimen  and confirmed on repeat testing.  While 2019 novel coronavirus  (SARS-CoV-2) nucleic acids may be present in the submitted sample  additional confirmatory testing may be necessary for epidemiological  and / or clinical management purposes  to differentiate between  SARS-CoV-2 and other Sarbecovirus currently known to infect humans.  If clinically indicated additional testing with an alternate test  methodology [phone]) is advised. The SARS-CoV-2 RNA is generally  detectable in upper and lower respiratory sp ecimens during the acute  phase of infection. The expected result is Negative. Fact Sheet for Patients:  StrictlyIdeas.no Fact Sheet for Healthcare Providers: BankingDealers.co.za This test is not yet approved or cleared by the [place] FDA and has been authorized for detection and/or diagnosis of SARS-CoV-2 by FDA under an Emergency Use Authorization (EUA).  This EUA will remain in effect (meaning this test can be used) for the duration of the COVID-19 declaration under Section 564(b)(1) of the Act, 21 U.S.C. section 360bbb-3(b)(1), unless the authorization is terminated or revoked sooner. Performed at [facility], [place] [address], [place], [place] [address]   MRSA PCR Screening     Status: None   Collection Time: 04/15/19  7:40 PM   Specimen: Nasal Mucosa; Nasopharyngeal  Result Value Ref Range Status   MRSA by PCR NEGATIVE NEGATIVE Final    Comment:        The GeneXpert MRSA Assay (FDA approved for NASAL specimens only), is one component of a comprehensive MRSA colonization surveillance program. It is not intended to diagnose MRSA infection nor to guide or monitor treatment for MRSA infections. Performed at [facility], [place] [address], [place], [place] [address]          Radiology Studies: Ct Code Stroke Cta Head W/wo Contrast  Result Date:  04/15/2019 CLINICAL DATA:  Left-sided weakness EXAM: CT ANGIOGRAPHY HEAD AND NECK CT PERFUSION BRAIN TECHNIQUE: Multidetector CT imaging of the head and neck was performed using the standard protocol during bolus administration of intravenous contrast. Multiplanar CT image reconstructions and MIPs were obtained to evaluate the vascular anatomy. Carotid stenosis measurements (when applicable) are obtained utilizing NASCET criteria, using the distal internal carotid diameter as the denominator. Multiphase CT imaging of the brain was performed following IV bolus contrast injection. Subsequent parametric perfusion maps were calculated using RAPID software. CONTRAST:  Dose is currently not available COMPARISON:  None. FINDINGS: The scanner has a malfunction such that images are being reviewed on TeraRecon rather than in pacs CTA NECK FINDINGS Aortic arch: No acute finding Right carotid system: Moderate atherosclerotic plaque mainly at the bifurcation. No ulceration or flow limiting stenosis. Left carotid system:  Atherosclerotic plaque to a moderate degree, mainly at the bifurcation. No flow limiting stenosis or ulceration. Vertebral arteries: No proximal subclavian stenosis or ulceration. Moderate narrowing at the right vertebral origin due to atherosclerosis. Skeleton: Cervical spine degeneration with bulky ridging. Other neck: No acute finding Upper chest: Patulous esophagus. Patchy air trapping in the apical lungs. Review of the MIP images confirms the above findings CTA HEAD FINDINGS Anterior circulation: Right M1 occlusion, expected based on prior head CT. The right A1 is patent. No additional embolism is seen. Poor collaterals at the arterial phase. Posterior circulation: Widely patent vertebral arteries. Mild-to-moderate atheromatous irregularity of the basilar. Extensive atherosclerotic narrowing of the bilateral posterior cerebral arteries with high-grade proximal stenoses. Venous sinuses: Not well assessed on  this arterial study Anatomic variants: None significant Delayed phase: Not obtained Review of the MIP images confirms the above findings CT Brain Perfusion Findings: ASPECTS: 3 CBF (<30%) Volume: 13mL Perfusion (Tmax>6.0s) volume: 154mL Mismatch Volume: 30mL Infarction Location:Right MCA territory IMPRESSION: 1. 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 2. No flow limiting stenosis or embolic source seen in the right ICA. 3. Advanced atherosclerosis of the bilateral posterior cerebral arteries. There are poor collaterals along the cerebral convexities. Electronically Signed   By: Monte Fantasia M.D.   On: 04/15/2019 11:13   Dg Chest 2 View  Result Date: 04/15/2019 CLINICAL DATA:  Transient ischemic attack. EXAM: CHEST - 2 VIEW COMPARISON:  Radiograph December 24, 2018. FINDINGS: Stable cardiomediastinal silhouette. No pneumothorax or pleural effusion is noted. No acute pulmonary disease is noted. Old lower thoracic vertebral body fracture is noted. No acute bony abnormality is noted. IMPRESSION: No active cardiopulmonary disease. Electronically Signed   By: Marijo Conception M.D.   On: 04/15/2019 12:44   Ct Code Stroke Cta Neck W/wo Contrast  Result Date: 04/15/2019 CLINICAL DATA:  Left-sided weakness EXAM: CT ANGIOGRAPHY HEAD AND NECK CT PERFUSION BRAIN TECHNIQUE: Multidetector CT imaging of the head and neck was performed using the standard protocol during bolus administration of intravenous contrast. Multiplanar CT image reconstructions and MIPs were obtained to evaluate the vascular anatomy. Carotid stenosis measurements (when applicable) are obtained utilizing NASCET criteria, using the distal internal carotid diameter as the denominator. Multiphase CT imaging of the brain was performed following IV bolus contrast injection. Subsequent parametric perfusion maps were calculated using RAPID software. CONTRAST:  Dose is currently  not available COMPARISON:  None. FINDINGS: The scanner has a malfunction such that images are being reviewed on TeraRecon rather than in pacs CTA NECK FINDINGS Aortic arch: No acute finding Right carotid system: Moderate atherosclerotic plaque mainly at the bifurcation. No ulceration or flow limiting stenosis. Left carotid system: Atherosclerotic plaque to a moderate degree, mainly at the bifurcation. No flow limiting stenosis or ulceration. Vertebral arteries: No proximal subclavian stenosis or ulceration. Moderate narrowing at the right vertebral origin due to atherosclerosis. Skeleton: Cervical spine degeneration with bulky ridging. Other neck: No acute finding Upper chest: Patulous esophagus. Patchy air trapping in the apical lungs. Review of the MIP images confirms the above findings CTA HEAD FINDINGS Anterior circulation: Right M1 occlusion, expected based on prior head CT. The right A1 is patent. No additional embolism is seen. Poor collaterals at the arterial phase. Posterior circulation: Widely patent vertebral arteries. Mild-to-moderate atheromatous irregularity of the basilar. Extensive atherosclerotic narrowing of the bilateral posterior cerebral arteries with high-grade proximal stenoses. Venous sinuses: Not well assessed on this arterial  study Anatomic variants: None significant Delayed phase: Not obtained Review of the MIP images confirms the above findings CT Brain Perfusion Findings: ASPECTS: 3 CBF (<30%) Volume: 177mL Perfusion (Tmax>6.0s) volume: 169mL Mismatch Volume: 73mL Infarction Location:Right MCA territory IMPRESSION: 1. 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 2. No flow limiting stenosis or embolic source seen in the right ICA. 3. Advanced atherosclerosis of the bilateral posterior cerebral arteries. There are poor collaterals along the cerebral convexities. Electronically Signed   By: Monte Fantasia M.D.    On: 04/15/2019 11:13   Mr Brain Wo Contrast  Result Date: 04/16/2019 CLINICAL DATA:  Follow-up examination for acute stroke, right M1 occlusion on prior CTA. EXAM: MRI HEAD WITHOUT CONTRAST TECHNIQUE: Multiplanar, multiecho pulse sequences of the brain and surrounding structures were obtained without intravenous contrast. COMPARISON:  Prior CT from 04/15/2019 FINDINGS: Brain: Examination moderately degraded by motion artifact. Generalized age-related cerebral atrophy with moderate chronic microvascular ischemic disease. Extensive restricted diffusion throughout essentially the entirety of the right MCA territory, compatible with large evolving acute right MCA territory infarct. Patchy right ACA involvement involving the parasagittal right frontal and parietal regions present as well. Associated edema throughout the right cerebral hemisphere with early regional mass effect. Right lateral ventricle partially effaced, with trace 2 mm right-to-left shift. No hydrocephalus or ventricular trapping. Basilar cisterns remain patent. Associated petechial hemorrhage throughout the right frontal and parietal lobes as well as the right basal ganglia seen on SWI sequence. No frank intraparenchymal hematoma or hemorrhagic transformation. No other evidence for acute or subacute ischemia. Gray-white matter differentiation otherwise maintained. No other areas of chronic cortical infarction. No mass lesion. No extra-axial fluid collection. Pituitary gland suprasellar region within normal limits. Vascular: Major intracranial vascular flow voids maintained at the skull base. Known right M1 occlusion better seen on prior CTA. Skull and upper cervical spine: Craniocervical junction within normal limits. No focal marrow replacing lesion. Scalp soft tissues unremarkable. Sinuses/Orbits: Patient status post bilateral ocular lens replacement. Right gaze noted. Remote posttraumatic defect at the left lamina papyracea. Chronic right  maxillary sinusitis. Trace opacity within the bilateral mastoid air cells, of doubtful significance. Other: None. IMPRESSION: 1. Large evolving acute ischemic right MCA/ACA territory infarcts involving the right cerebral hemisphere as above. Associated regional mass effect with early 2 mm right-to-left midline shift. No hydrocephalus or ventricular trapping. 2. Associated scattered petechial hemorrhage throughout the area of infarction without frank hemorrhagic transformation. 3. Underlying age-related cerebral atrophy with moderate chronic microvascular ischemic disease. Electronically Signed   By: Jeannine Boga M.D.   On: 04/16/2019 04:30   Ct Code Stroke Cta Cerebral Perfusion W/wo Contrast  Result Date: 04/15/2019 CLINICAL DATA:  Left-sided weakness EXAM: CT ANGIOGRAPHY HEAD AND NECK CT PERFUSION BRAIN TECHNIQUE: Multidetector CT imaging of the head and neck was performed using the standard protocol during bolus administration of intravenous contrast. Multiplanar CT image reconstructions and MIPs were obtained to evaluate the vascular anatomy. Carotid stenosis measurements (when applicable) are obtained utilizing NASCET criteria, using the distal internal carotid diameter as the denominator. Multiphase CT imaging of the brain was performed following IV bolus contrast injection. Subsequent parametric perfusion maps were calculated using RAPID software. CONTRAST:  Dose is currently not available COMPARISON:  None. FINDINGS: The scanner has a malfunction such that images are being reviewed on TeraRecon rather than in pacs CTA NECK FINDINGS Aortic arch: No acute finding Right carotid system: Moderate atherosclerotic plaque mainly at  the bifurcation. No ulceration or flow limiting stenosis. Left carotid system: Atherosclerotic plaque to a moderate degree, mainly at the bifurcation. No flow limiting stenosis or ulceration. Vertebral arteries: No proximal subclavian stenosis or ulceration. Moderate narrowing  at the right vertebral origin due to atherosclerosis. Skeleton: Cervical spine degeneration with bulky ridging. Other neck: No acute finding Upper chest: Patulous esophagus. Patchy air trapping in the apical lungs. Review of the MIP images confirms the above findings CTA HEAD FINDINGS Anterior circulation: Right M1 occlusion, expected based on prior head CT. The right A1 is patent. No additional embolism is seen. Poor collaterals at the arterial phase. Posterior circulation: Widely patent vertebral arteries. Mild-to-moderate atheromatous irregularity of the basilar. Extensive atherosclerotic narrowing of the bilateral posterior cerebral arteries with high-grade proximal stenoses. Venous sinuses: Not well assessed on this arterial study Anatomic variants: None significant Delayed phase: Not obtained Review of the MIP images confirms the above findings CT Brain Perfusion Findings: ASPECTS: 3 CBF (<30%) Volume: 171mL Perfusion (Tmax>6.0s) volume: 170mL Mismatch Volume: 2mL Infarction Location:Right MCA territory IMPRESSION: 1. 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 2. No flow limiting stenosis or embolic source seen in the right ICA. 3. Advanced atherosclerosis of the bilateral posterior cerebral arteries. There are poor collaterals along the cerebral convexities. Electronically Signed   By: Monte Fantasia M.D.   On: 04/15/2019 11:13   Ct Head Code Stroke Wo Contrast  Result Date: 04/15/2019 CLINICAL DATA:  Code stroke. Left-sided weakness. Right facial droop EXAM: CT HEAD WITHOUT CONTRAST TECHNIQUE: Contiguous axial images were obtained from the base of the skull through the vertex without intravenous contrast. COMPARISON:  12/14/2018 FINDINGS: Brain: Cytotoxic edema throughout the large majority of the right MCA territory. High-density appearance of the right caudate head is attributed to preserved gray matter rather than  hemorrhage. No acute hemorrhage is seen. Remote right occipital infarct. Chronic small vessel ischemia. No midline shift. Vascular: There may be hyperdense right M1 segment, which would be expected Skull: Negative Sinuses/Orbits: Chronic right maxillary sinusitis with near complete opacification. Other: These results were called by telephone at the time of interpretation on 04/15/2019 at 10:36 am to Dr. Rory Percy, who was already aware PACS is down at time of study and images were reviewed in TeraRecon ASPECTS [facility] Stroke Program Early CT Score) - Ganglionic level infarction (caudate, lentiform nuclei, internal capsule, insula, M1-M3 cortex): 1 - Supraganglionic infarction (M4-M6 cortex): 2 Total score (0-10 with 10 being normal): 3 IMPRESSION: 1. Large acute right MCA territory infarct.  ASPECTS is 3 at best. 2. No acute hemorrhage. Electronically Signed   By: Monte Fantasia M.D.   On: 04/15/2019 10:38        Scheduled Meds:  aspirin  300 mg Rectal Daily   Or   aspirin  325 mg Oral Daily   enoxaparin (LOVENOX) injection  40 mg Subcutaneous Q24H   insulin aspart  0-15 Units Subcutaneous TID WC   insulin aspart  0-5 Units Subcutaneous QHS   Continuous Infusions:  dextrose 5 % and 0.9% NaCl     valproate sodium Stopped (04/15/19 2120)   valproate sodium 500 mg (04/16/19 0836)     LOS: 1 day    Time spent: 35 mins.More than 50% of that time was spent in counseling and/or coordination of care.      Shelly Coss, MD [facility] Pager [phone]  If 7PM-7AM, please contact night-coverage [email or web address] Password [facility] 04/16/2019, 11:16  AM

## 2019-04-17 LAB — GLUCOSE, CAPILLARY
Glucose-Capillary: 188 mg/dL — ABNORMAL HIGH (ref 70–99)
Glucose-Capillary: 186 mg/dL — ABNORMAL HIGH (ref 70–99)
Glucose-Capillary: 189 mg/dL — ABNORMAL HIGH (ref 70–99)
Glucose-Capillary: 161 mg/dL — ABNORMAL HIGH (ref 70–99)
Glucose-Capillary: 157 mg/dL — ABNORMAL HIGH (ref 70–99)
Glucose-Capillary: 150 mg/dL — ABNORMAL HIGH (ref 70–99)

## 2019-04-17 NOTE — Progress Notes (Signed)
  Palliative medicine progress note  Patient seen, chart reviewed.  Patient is alert, able to maintain eye contact with communication and is beginning to follow simple commands such as squeeze my hand, wiggle your toes (which he was able to do bilaterally).  I did ask him simple yes and no questions which he appeared to respond to appropriately by nodding his head yes or no  He is not making any attempts to form words  I tried to reach his daughter William [name] at [phone].  I left her a message.  Ms. Haydee Monica did attempt to return my call last night when I was off service  Rush Landmark states that he is not short of breath nor in any pain.  There are no nonverbal signs and symptoms of distress Noted  Patient Profile:73 y.o.malewith past medical history of dementia, seizure do, ETOH abuse, CKD 3, DM4, LVEF 25-30%who [facility]with altered mental status, left sided weakness and facial droop. Imaging revealed a large MCA CVA..The patient is currently very minimally responsive. Per neurology this stroke will definitely leave him extremely disabled.   Palliative Medicine was consulted for goals of care.  At this point we have been unable to connect with family and patient is unable to participate in goals of care secondary to above clinical condition  04/17/19: Patient beginning to answer questions yes and no by head nodding his head.  Simple questions that he answers appear to be appropriate.   Plan  Care limit of DNR in place Patient is improving slightly Feeding will be an issue going forward that we would like to discuss with his family, daughter William [name].  Providers as well as social work have left messages with her  Prognosis To be determined.  He is becoming more alert and more communicative but hydration and nutrition remain a question which could alter his prognosis.  He is very high risk for aspiration  Disposition To be determined  Thank  you, Romona Curls, NP Total time: 25 minutes Greater than 50% of time was spent in counseling and coordination of care

## 2019-04-17 NOTE — Progress Notes (Signed)
CSW attempted to reach the patient's daughter, Galan Ghee to complete the initial TOC Assessment. CSW left a voicemail.   CSW is awaiting a return phone call.   Domenic Schwab, MSW, [place] Worker [facility]  [phone]

## 2019-04-17 NOTE — Progress Notes (Signed)
PROGRESS NOTE    William [name]  RWE:[id number] DOB: [birth year] DOA: 04/15/2019 PCP: Patient, No Pcp Per   Brief Narrative:  Patient is a 73 year old male from [facility] skilled nursing facility with history of dementia, diabetes mellitus, seizure disorder, hypertension, hyperlipidemia, hearing impairment, depression/anxiety, ethanol dependence, CKD stage III who presented with altered mental status.  He was found to have left-sided hemiplegia and left-sided facial droop.  He was comatose on presentation and unable to provide any information.  MRI of the brain showed large right-sided ischemic infarct.  Neurology consulted.  Given the size of the stroke, palliative care was consulted regarding goals of discussion due to  poor prognosis. Mental status continues to improve this morning.  He is alert and awake and obeys commands.  Assessment & Plan:   Principal Problem:   CVA (cerebral vascular accident) [facility]) Active Problems:   Essential hypertension   CKD (chronic kidney disease) stage 4, GFR 15-29 ml/min (HCC)   Seizure ([place])   Hyperlipidemia   Diabetes mellitus due to underlying condition, uncontrolled, with renal complication (HCC)   Pressure injury of skin   Acute on chronic congestive heart failure ([place])   Palliative care by specialist   Acute CVA: Presented with altered mental status, left hemiparesis, left facial droop. MRI showed large evolving acute ischemic right MCA/ACA territory infarcts involving the right cerebral hemisphere as above. Associated regional mass effect with early 2 mm right-to-left midline shift. Echo showed reduced ejection fraction of 25 to 30%.  His ejection fraction was 40 to 45% as per echocardiogram on 12/15/2018.  Given his poor prognosis, I do not think we need to consult cardiology.  He is not a candidate for any kind of intervention. Continue aspirin. PT/OT consulted.  Recommended skilled nursing facility on discharge.  Speech recommend  n.p.o. status Given the size of stroke, palliative care following.  He is a candidate for hospice/comfort care.  Currently DNR.  Trying to reach family for the decision of hospice/comfort care initiation.No success.We will continue to try. Since patient's mental status has continued to improve, he might be a candidate for feeding by tube or PEG if he continues to show improvement. LDL of 129.  Hemoglobin A1c of 6.3.  Will continue statin on discharge.  Dysphagia:Since patient's mental status has continued to improve, he might be a candidate for feeding by tube or PEG if he continues to show improvement.  Speech therapy following.  Stage IV CKD: CKD might have progressed since 2019.  Stable since 11/2018. His creatinine in 12/2017 was 1.2.  Hypertension: Allow permissive hypertension.  Use PRN meds if blood pressure more than 220/120 mmHg  Hyperlipidemia: LDL of 129.Continue Lipitor  Diabetes mellitus: HbA1c of 6.5..  On Toujeo before.  Continue sliding scale  insulin for now.  Seizure disorder: On Depakote.  Transition to IV because he is unable to take p.o.     Nutrition Problem: Inadequate oral intake Etiology: inability to eat      DVT prophylaxis: Lovenox Code Status: DNR Family Communication: None Disposition Plan: Undetermined at this point.  Trying to reach family for goals of care discussion.  If mental status continues to improve, he might be a candidate for PEG/tube feeding.  Anticipate prolonged hospitalization.  PT/OT recommending skilled nursing facility.  Consultants: Neurology, palliative care  Procedures: MRI  Antimicrobials:  Anti-infectives (From admission, onward)   None      Subjective:  Patient seen and examined the bedside this morning.  Alert and awake.  Eyes  open.  Follows commands.  Tries to speak but is aphasic.  Objective: Vitals:   04/17/19 0220 04/17/19 0230 04/17/19 0330 04/17/19 0926  BP: 126/80   (!) 147/103  Pulse: 68   (!) 118  Resp:  18   16  Temp:  98.8 F (37.1 C) 98.3 F (36.8 C) (!) 97.1 F (36.2 C)  TempSrc:  Oral Axillary Axillary  SpO2: 93%   99%  Weight:      Height:        Intake/Output Summary (Last 24 hours) at 04/17/2019 1106 Last data filed at 04/17/2019 1048 Gross per 24 hour  Intake 1632.82 ml  Output 625 ml  Net 1007.82 ml   Filed Weights   04/15/19 0938  Weight: 90.7 kg    Examination:  General exam: Extremely debilitated, chronically ill looking HEENT: Left facial droop, ear/Nose normal on gross exam Respiratory system: Bilateral equal air entry, normal vesicular breath sounds, no wheezes or crackles  Cardiovascular system: S1 & S2 heard, RRR. No JVD, murmurs, rubs, gallops or clicks. No pedal edema. Gastrointestinal system: Abdomen is nondistended, soft and nontender. No organomegaly or masses felt. Normal bowel sounds heard. Central nervous system: Alert ,awake,dysarthia  Dense hemiplegia on the left side.   Motor: RUE/RLE: 3+/5 LUE/LLE: 1-2/5 Extremities: No edema, no clubbing ,no cyanosis, distal peripheral pulses palpable. Skin: No rashes, lesions or ulcers,no icterus ,no pallor   Data Reviewed: I have personally reviewed following labs and imaging studies  CBC: Recent Labs  Lab 04/15/19 0952 04/15/19 1010  WBC 8.6  --   NEUTROABS 7.2  --   HGB 14.3 15.3  HCT 45.2 45.0  MCV 89.3  --   PLT 256  --    Basic Metabolic Panel: Recent Labs  Lab 04/15/19 0952 04/15/19 1010  NA 139 139  K 5.1 4.7  CL 104 105  CO2 22  --   GLUCOSE 189* 183*  BUN 30* 30*  CREATININE 2.18* 2.00*  CALCIUM 9.5  --    GFR: Estimated Creatinine Clearance: 35 mL/min (A) (by C-G formula based on SCr of 2 mg/dL (H)). Liver Function Tests: Recent Labs  Lab 04/15/19 0952  AST 22  ALT 19  ALKPHOS 59  BILITOT 1.1  PROT 6.8  ALBUMIN 3.8   No results for input(s): LIPASE, AMYLASE in the last 168 hours. No results for input(s): AMMONIA in the last 168 hours. Coagulation Profile:  Recent Labs  Lab 04/15/19 0952  INR 1.3*   Cardiac Enzymes: No results for input(s): CKTOTAL, CKMB, CKMBINDEX, TROPONINI in the last 168 hours. BNP (last 3 results) No results for input(s): PROBNP in the last 8760 hours. HbA1C: Recent Labs    04/16/19 0452  HGBA1C 6.3*   CBG: Recent Labs  Lab 04/16/19 1702 04/16/19 2003 04/16/19 2343 04/17/19 0334 04/17/19 0730  GLUCAP 162* 127* 167* 188* 186*   Lipid Profile: Recent Labs    04/16/19 0452  CHOL 188  HDL 26*  LDLCALC 129*  TRIG 167*  CHOLHDL 7.2   Thyroid Function Tests: No results for input(s): TSH, T4TOTAL, FREET4, T3FREE, THYROIDAB in the last 72 hours. Anemia Panel: No results for input(s): VITAMINB12, FOLATE, FERRITIN, TIBC, IRON, RETICCTPCT in the last 72 hours. Sepsis Labs: No results for input(s): PROCALCITON, LATICACIDVEN in the last 168 hours.  Recent Results (from the past 240 hour(s))  SARS Coronavirus 2 (CEPHEID - Performed in [place] hospital lab), Hosp Order     Status: None   Collection Time: 04/15/19 11:01 AM  Specimen: Nasopharyngeal Swab  Result Value Ref Range Status   SARS Coronavirus 2 NEGATIVE NEGATIVE Final    Comment: (NOTE) If result is NEGATIVE SARS-CoV-2 target nucleic acids are NOT DETECTED. The SARS-CoV-2 RNA is generally detectable in upper and lower  respiratory specimens during the acute phase of infection. The lowest  concentration of SARS-CoV-2 viral copies this assay can detect is 250  copies / mL. A negative result does not preclude SARS-CoV-2 infection  and should not be used as the sole basis for treatment or other  patient management decisions.  A negative result may occur with  improper specimen collection / handling, submission of specimen other  than nasopharyngeal swab, presence of viral mutation(s) within the  areas targeted by this assay, and inadequate number of viral copies  (<250 copies / mL). A negative result must be combined with clinical   observations, patient history, and epidemiological information. If result is POSITIVE SARS-CoV-2 target nucleic acids are DETECTED. The SARS-CoV-2 RNA is generally detectable in upper and lower  respiratory specimens dur ing the acute phase of infection.  Positive  results are indicative of active infection with SARS-CoV-2.  Clinical  correlation with patient history and other diagnostic information is  necessary to determine patient infection status.  Positive results do  not rule out bacterial infection or co-infection with other viruses. If result is PRESUMPTIVE POSTIVE SARS-CoV-2 nucleic acids MAY BE PRESENT.   A presumptive positive result was obtained on the submitted specimen  and confirmed on repeat testing.  While 2019 novel coronavirus  (SARS-CoV-2) nucleic acids may be present in the submitted sample  additional confirmatory testing may be necessary for epidemiological  and / or clinical management purposes  to differentiate between  SARS-CoV-2 and other Sarbecovirus currently known to infect humans.  If clinically indicated additional testing with an alternate test  methodology [phone]) is advised. The SARS-CoV-2 RNA is generally  detectable in upper and lower respiratory sp ecimens during the acute  phase of infection. The expected result is Negative. Fact Sheet for Patients:  StrictlyIdeas.no Fact Sheet for Healthcare Providers: BankingDealers.co.za This test is not yet approved or cleared by the [place] FDA and has been authorized for detection and/or diagnosis of SARS-CoV-2 by FDA under an Emergency Use Authorization (EUA).  This EUA will remain in effect (meaning this test can be used) for the duration of the COVID-19 declaration under Section 564(b)(1) of the Act, 21 U.S.C. section 360bbb-3(b)(1), unless the authorization is terminated or revoked sooner. Performed at [facility], [place] [address]  [place], [place] [address]   MRSA PCR Screening     Status: None   Collection Time: 04/15/19  7:40 PM   Specimen: Nasal Mucosa; Nasopharyngeal  Result Value Ref Range Status   MRSA by PCR NEGATIVE NEGATIVE Final    Comment:        The GeneXpert MRSA Assay (FDA approved for NASAL specimens only), is one component of a comprehensive MRSA colonization surveillance program. It is not intended to diagnose MRSA infection nor to guide or monitor treatment for MRSA infections. Performed at [facility], [place] [address] [place],  [address]          Radiology Studies: Dg Chest 2 View  Result Date: 04/15/2019 CLINICAL DATA:  Transient ischemic attack. EXAM: CHEST - 2 VIEW COMPARISON:  Radiograph December 24, 2018. FINDINGS: Stable cardiomediastinal silhouette. No pneumothorax or pleural effusion is noted. No acute pulmonary disease is noted. Old lower thoracic vertebral body  fracture is noted. No acute bony abnormality is noted. IMPRESSION: No active cardiopulmonary disease. Electronically Signed   By: Marijo Conception M.D.   On: 04/15/2019 12:44   Mr Brain Wo Contrast  Result Date: 04/16/2019 CLINICAL DATA:  Follow-up examination for acute stroke, right M1 occlusion on prior CTA. EXAM: MRI HEAD WITHOUT CONTRAST TECHNIQUE: Multiplanar, multiecho pulse sequences of the brain and surrounding structures were obtained without intravenous contrast. COMPARISON:  Prior CT from 04/15/2019 FINDINGS: Brain: Examination moderately degraded by motion artifact. Generalized age-related cerebral atrophy with moderate chronic microvascular ischemic disease. Extensive restricted diffusion throughout essentially the entirety of the right MCA territory, compatible with large evolving acute right MCA territory infarct. Patchy right ACA involvement involving the parasagittal right frontal and parietal regions present as well. Associated edema throughout the right cerebral hemisphere with early regional mass  effect. Right lateral ventricle partially effaced, with trace 2 mm right-to-left shift. No hydrocephalus or ventricular trapping. Basilar cisterns remain patent. Associated petechial hemorrhage throughout the right frontal and parietal lobes as well as the right basal ganglia seen on SWI sequence. No frank intraparenchymal hematoma or hemorrhagic transformation. No other evidence for acute or subacute ischemia. Gray-white matter differentiation otherwise maintained. No other areas of chronic cortical infarction. No mass lesion. No extra-axial fluid collection. Pituitary gland suprasellar region within normal limits. Vascular: Major intracranial vascular flow voids maintained at the skull base. Known right M1 occlusion better seen on prior CTA. Skull and upper cervical spine: Craniocervical junction within normal limits. No focal marrow replacing lesion. Scalp soft tissues unremarkable. Sinuses/Orbits: Patient status post bilateral ocular lens replacement. Right gaze noted. Remote posttraumatic defect at the left lamina papyracea. Chronic right maxillary sinusitis. Trace opacity within the bilateral mastoid air cells, of doubtful significance. Other: None. IMPRESSION: 1. Large evolving acute ischemic right MCA/ACA territory infarcts involving the right cerebral hemisphere as above. Associated regional mass effect with early 2 mm right-to-left midline shift. No hydrocephalus or ventricular trapping. 2. Associated scattered petechial hemorrhage throughout the area of infarction without frank hemorrhagic transformation. 3. Underlying age-related cerebral atrophy with moderate chronic microvascular ischemic disease. Electronically Signed   By: Jeannine Boga M.D.   On: 04/16/2019 04:30        Scheduled Meds: . aspirin  300 mg Rectal Daily   Or  . aspirin  325 mg Oral Daily  . enoxaparin (LOVENOX) injection  40 mg Subcutaneous Q24H  . insulin aspart  0-15 Units Subcutaneous TID WC  . insulin aspart  0-5  Units Subcutaneous QHS   Continuous Infusions: . dextrose 5 % and 0.9% NaCl 75 mL/hr at 04/16/19 1240  . valproate sodium Stopped (04/17/19 0708)  . valproate sodium 500 mg (04/17/19 0931)     LOS: 2 days    Time spent: 35 mins.More than 50% of that time was spent in counseling and/or coordination of care.      Shelly Coss, MD [facility] Pager [phone]  If 7PM-7AM, please contact night-coverage [email or web address] Password [email or web address] 04/17/2019, 11:06 AM

## 2019-04-18 DIAGNOSIS — Z515 Encounter for palliative care: Secondary | ICD-10-CM

## 2019-04-18 DIAGNOSIS — I5023 Acute on chronic systolic (congestive) heart failure: Secondary | ICD-10-CM

## 2019-04-18 LAB — GLUCOSE, CAPILLARY
Glucose-Capillary: 162 mg/dL — ABNORMAL HIGH (ref 70–99)
Glucose-Capillary: 172 mg/dL — ABNORMAL HIGH (ref 70–99)
Glucose-Capillary: 179 mg/dL — ABNORMAL HIGH (ref 70–99)
Glucose-Capillary: 184 mg/dL — ABNORMAL HIGH (ref 70–99)

## 2019-04-18 LAB — BASIC METABOLIC PANEL
Anion gap: 10 (ref 5–15)
BUN: 21 mg/dL (ref 8–23)
CO2: 22 mmol/L (ref 22–32)
Calcium: 8.9 mg/dL (ref 8.9–10.3)
Chloride: 114 mmol/L — ABNORMAL HIGH (ref 98–111)
Creatinine, Ser: 1.88 mg/dL — ABNORMAL HIGH (ref 0.61–1.24)
GFR calc Af Amer: 40 mL/min — ABNORMAL LOW (ref >60)
GFR calc non Af Amer: 34 mL/min — ABNORMAL LOW (ref >60)
Glucose, Bld: 191 mg/dL — ABNORMAL HIGH (ref 70–99)
Potassium: 4.1 mmol/L (ref 3.5–5.1)
Sodium: 146 mmol/L — ABNORMAL HIGH (ref 135–145)

## 2019-04-18 MED ORDER — POLYVINYL ALCOHOL 1.4 % OP SOLN
1.0000 [drp] | Freq: Four times a day (QID) | OPHTHALMIC | Status: DC | PRN
  Filled 2019-04-18: qty 15

## 2019-04-18 MED ORDER — GLYCOPYRROLATE 0.2 MG/ML IJ SOLN: 0.2000 mg | INTRAMUSCULAR | Status: DC | PRN

## 2019-04-18 MED ORDER — HALOPERIDOL LACTATE 2 MG/ML PO CONC
0.5000 mg | ORAL | Status: DC | PRN
  Filled 2019-04-18: qty 0.3

## 2019-04-18 MED ORDER — HYDROMORPHONE HCL 1 MG/ML IJ SOLN: 0.2500 mg | INTRAMUSCULAR | Status: DC | PRN

## 2019-04-18 MED ORDER — HALOPERIDOL LACTATE 5 MG/ML IJ SOLN: 0.5000 mg | INTRAMUSCULAR | Status: DC | PRN

## 2019-04-18 MED ORDER — ONDANSETRON HCL 4 MG/2ML IJ SOLN: 4.0000 mg | Freq: Four times a day (QID) | INTRAMUSCULAR | Status: DC | PRN

## 2019-04-18 MED ORDER — GLYCOPYRROLATE 1 MG PO TABS
1.0000 mg | ORAL_TABLET | ORAL | Status: DC | PRN
  Filled 2019-04-18: qty 1

## 2019-04-18 MED ORDER — HALOPERIDOL 0.5 MG PO TABS
0.5000 mg | ORAL_TABLET | ORAL | Status: DC | PRN
  Filled 2019-04-18: qty 1

## 2019-04-18 MED ORDER — DEXTROSE 5 % IV SOLN
INTRAVENOUS | Status: DC
  Administered 2019-04-18: 09:00:00 via INTRAVENOUS

## 2019-04-18 MED ORDER — BIOTENE DRY MOUTH MT LIQD: 15.0000 mL | OROMUCOSAL | Status: DC | PRN

## 2019-04-18 MED ORDER — ONDANSETRON 4 MG PO TBDP: 4.0000 mg | ORAL_TABLET | Freq: Four times a day (QID) | ORAL | Status: DC | PRN

## 2019-04-18 NOTE — Progress Notes (Addendum)
  Speech Language Pathology Treatment: Dysphagia  Patient Details Name: William [name] MRN: [id number] DOB: [birth year] Today's Date: 04/18/2019 Time: 1205-1217 SLP Time Calculation (min) (ACUTE ONLY): 12 min  Assessment / Plan / Recommendation Clinical Impression  Pt seen to assess for readiness for po intake.  Upon entrance to room, NT was taking pt's blood sugar - 179 per NT.  Pt's face turned red with pt not breathing and his right arm started shaking and he took a moment to recover.  Then pt was noted to have periods of apnea for up to 10-14 seconds requiring verbal/tactile stimulation to initiate swallow.  Following apnea episodes, SLP provided pt with single tsp of nectar thick bolus after oral care provided.  Pt with lingual thrusting most notably to the right without labial closure with swallow.   Pt lingual thrusting with delayed oral transiting/swallow followed by weak cough.  Unfortunately pt has not demonstrated improvement in his swallowing nor mentation today compared to during initial evaluation on Saturday 04/16/2019.  Swallow function to improve to level to allow nutrition without high aspiration risk is poor.  Please continue oral care for pt comfort and to decrease aspiration pna risk.  Do not anticipate pt would benefit from long term feeding tube due to his dementia/likely secretion aspiration and high level of burden without proven benefit.  Most comforting for this pt at this time may be oral care without po intake.    Pt is able to answer some yes/no questions re: his needs, orientation to self, etc.  As deemed during his prior SLE, he responds well to yes/no questions for his needs, comfort, etc.     HPI HPI: 73 yo with h/o ETOH use, cervical spondylosis, cerebellar atrophy adm with 73 y.o. male with past medical history of dementia, seizure do, ETOH abuse, CKD 3, DM4, LVEF 40% who was admitted from SNF on 04/15/2019 with altered mental status, left sided weakness and facial  droop.  Imaging revealed a large MCA CVA with shift. Palliative noted indicates that neuro states pt will be extremely disabled.  Palliative Medicine was consulted for goals of care.      SLP Plan  Continue with current plan of care       Recommendations  Diet recommendations: NPO Medication Administration: Via alternative means Postural Changes and/or Swallow Maneuvers: Seated upright 90 degrees;Upright 30-60 min after meal                General recommendations: Other(comment) Oral Care Recommendations: Oral care QID Follow up Recommendations: Skilled Nursing facility(TBD dependent on [place]) SLP Visit Diagnosis: Aphasia (R47.01);Cognitive communication deficit (R41.841);Aphonia (R49.1);Dysarthria and anarthria (R47.1) Attention and concentration deficit following: Cerebral infarction Plan: Continue with current plan of care       [place], [facility] SLP [place] Pager [phone] Office [phone]  Macario Golds 04/18/2019, 75:27 PM

## 2019-04-18 NOTE — Progress Notes (Signed)
PROGRESS NOTE    William [name]  ZOX:[id number] DOB: 1946-07-22 DOA: 04/15/2019 PCP: Patient, No Pcp Per   Brief Narrative:  Patient is a 73 year old male from [facility] skilled nursing facility with history of dementia, diabetes mellitus, seizure disorder, hypertension, hyperlipidemia, hearing impairment, depression/anxiety, ethanol dependence, CKD stage III who presented with altered mental status.  He was found to have left-sided hemiplegia and left-sided facial droop.  He was comatose on presentation and unable to provide any information.  MRI of the brain showed large right-sided ischemic infarct.  Neurology consulted.  Given the size of the stroke, palliative care was consulted regarding goals of discussion due to  poor prognosis.  04/18/19: This morning, patient looked lethargic, weak.  He opens his eyes when calling his name and tries to obeys commands.  Given his massive stroke, I do not think he will recover and he will have very poor quality of life.  Patient will not benefit from tube feeding/PEG due to his unrecoverable poor quality of life.  I attempted to call his daughter William [name] at [phone] but she did not receive the phone and actually she hanged up on the second call. Voice msg left.  Assessment & Plan:   Principal Problem:   CVA (cerebral vascular accident) [facility]) Active Problems:   Essential hypertension   CKD (chronic kidney disease) stage 4, GFR 15-29 ml/min (HCC)   Seizure ([place])   Hyperlipidemia   Diabetes mellitus due to underlying condition, uncontrolled, with renal complication (HCC)   Pressure injury of skin   Acute on chronic congestive heart failure ([place])   Palliative care by specialist   Acute CVA: Presented with altered mental status, left hemiparesis, left facial droop. MRI showed large evolving acute ischemic right MCA/ACA territory infarcts involving the right cerebral hemisphere as above. Associated regional mass effect with early  2 mm right-to-left midline shift. Echo showed reduced ejection fraction of 25 to 30%.  His ejection fraction was 40 to 45% as per echocardiogram on 12/15/2018.  Given his poor prognosis, I do not think we need to consult cardiology.  He is not a candidate for any kind of intervention. Continue aspirin. PT/OT consulted.  Recommended skilled nursing facility on discharge.  Speech recommend n.p.o. status Given the size of stroke, palliative care following.  He is a candidate for hospice/comfort care.  Currently DNR.  Trying to reach family for the decision of hospice/comfort care initiation.No success.We will continue to try. He is not a a candidate for feeding by tube or PEG given the poor quality of life due to massive stroke.  But we want to talk to the family before making any decisions.  He is a candidate for residential hospice.  Patient is unable to make decisions by himself . LDL of 129.  Hemoglobin A1c of 6.3.  Will continue statin on discharge.  Dysphagia:NPO. Speech therapy following.  Stage IV CKD: CKD might have progressed since 2019.  Stable since 11/2018. His creatinine in 12/2017 was 1.2.  Hypertension: Allow permissive hypertension.  Use PRN meds if blood pressure more than 220/120 mmHg  Hyperlipidemia: LDL of 129.Continue Lipitor  Diabetes mellitus: HbA1c of 6.5..  On Toujeo before.  Continue sliding scale  insulin for now.  Seizure disorder: On Depakote.  Transition to IV because he is unable to take p.o.     Nutrition Problem: Inadequate oral intake Etiology: inability to eat      DVT prophylaxis: Lovenox Code Status: DNR Family Communication: None Disposition Plan: Undetermined at  this point.  Trying to reach family for goals of care discussion.  If mental status continues to improve, he might be a candidate for PEG/tube feeding. But given his poor quality of life due to massive stroke, this will not be helpful for the patient.PT/OT recommending skilled nursing  facility.  Consultants: Neurology, palliative care  Procedures: MRI  Antimicrobials:  Anti-infectives (From admission, onward)   None      Subjective:  Patient seen and examined the bedside this morning.  He looked lethargic and weak today.  Opens his eyes on calling his name and tries to follow commands but very weak.  Objective: Vitals:   04/18/19 0401 04/18/19 0546 04/18/19 0734 04/18/19 1150  BP: (!) 155/96 (!) 161/99 (!) 147/100 (!) 164/108  Pulse: (!) 42 82 70 87  Resp:   15 19  Temp:   98 F (36.7 C) (!) 97.2 F (36.2 C)  TempSrc:   Axillary Axillary  SpO2:   99% 96%  Weight:      Height:        Intake/Output Summary (Last 24 hours) at 04/18/2019 1256 Last data filed at 04/18/2019 4010 Gross per 24 hour  Intake 293.03 ml  Output 600 ml  Net -306.97 ml   Filed Weights   04/15/19 0938  Weight: 90.7 kg    Examination:  General exam: Extremely debilitated, chronically ill looking  HEENT:Left facial droop, Ear/Nose normal on gross exam Respiratory system: Bilateral equal air entry, normal vesicular breath sounds, no wheezes or crackles  Cardiovascular system: NSR. No JVD, murmurs, rubs, gallops or clicks. Gastrointestinal system: Abdomen is nondistended, soft and nontender. No organomegaly or masses felt. Normal bowel sounds heard. Central nervous system: Not alert or oriented.Aphagia Dense hemiplegia on the left side.   Motor: RUE/RLE: 3+/5 LUE/LLE: 1-2/5 Extremities: No edema, no clubbing ,no cyanosis, distal peripheral pulses palpable. Skin: No rashes, lesions or ulcers,no icterus ,no pallor    Data Reviewed: I have personally reviewed following labs and imaging studies  CBC: Recent Labs  Lab 04/15/19 0952 04/15/19 1010  WBC 8.6  --   NEUTROABS 7.2  --   HGB 14.3 15.3  HCT 45.2 45.0  MCV 89.3  --   PLT 256  --    Basic Metabolic Panel: Recent Labs  Lab 04/15/19 0952 04/15/19 1010 04/18/19 0544  NA 139 139 146*  K 5.1 4.7 4.1  CL 104  105 114*  CO2 22  --  22  GLUCOSE 189* 183* 191*  BUN 30* 30* 21  CREATININE 2.18* 2.00* 1.88*  CALCIUM 9.5  --  8.9   GFR: Estimated Creatinine Clearance: 37.3 mL/min (A) (by C-G formula based on SCr of 1.88 mg/dL (H)). Liver Function Tests: Recent Labs  Lab 04/15/19 0952  AST 22  ALT 19  ALKPHOS 59  BILITOT 1.1  PROT 6.8  ALBUMIN 3.8   No results for input(s): LIPASE, AMYLASE in the last 168 hours. No results for input(s): AMMONIA in the last 168 hours. Coagulation Profile: Recent Labs  Lab 04/15/19 0952  INR 1.3*   Cardiac Enzymes: No results for input(s): CKTOTAL, CKMB, CKMBINDEX, TROPONINI in the last 168 hours. BNP (last 3 results) No results for input(s): PROBNP in the last 8760 hours. HbA1C: Recent Labs    04/16/19 0452  HGBA1C 6.3*   CBG: Recent Labs  Lab 04/17/19 2004 04/17/19 2319 04/18/19 0355 04/18/19 0845 04/18/19 1142  GLUCAP 157* 150* 162* 172* 179*   Lipid Profile: Recent Labs    04/16/19  0452  CHOL 188  HDL 26*  LDLCALC 129*  TRIG 167*  CHOLHDL 7.2   Thyroid Function Tests: No results for input(s): TSH, T4TOTAL, FREET4, T3FREE, THYROIDAB in the last 72 hours. Anemia Panel: No results for input(s): VITAMINB12, FOLATE, FERRITIN, TIBC, IRON, RETICCTPCT in the last 72 hours. Sepsis Labs: No results for input(s): PROCALCITON, LATICACIDVEN in the last 168 hours.  Recent Results (from the past 240 hour(s))  SARS Coronavirus 2 (CEPHEID - Performed in [place] hospital lab), Hosp Order     Status: None   Collection Time: 04/15/19 11:01 AM   Specimen: Nasopharyngeal Swab  Result Value Ref Range Status   SARS Coronavirus 2 NEGATIVE NEGATIVE Final    Comment: (NOTE) If result is NEGATIVE SARS-CoV-2 target nucleic acids are NOT DETECTED. The SARS-CoV-2 RNA is generally detectable in upper and lower  respiratory specimens during the acute phase of infection. The lowest  concentration of SARS-CoV-2 viral copies this assay can detect is  250  copies / mL. A negative result does not preclude SARS-CoV-2 infection  and should not be used as the sole basis for treatment or other  patient management decisions.  A negative result may occur with  improper specimen collection / handling, submission of specimen other  than nasopharyngeal swab, presence of viral mutation(s) within the  areas targeted by this assay, and inadequate number of viral copies  (<250 copies / mL). A negative result must be combined with clinical  observations, patient history, and epidemiological information. If result is POSITIVE SARS-CoV-2 target nucleic acids are DETECTED. The SARS-CoV-2 RNA is generally detectable in upper and lower  respiratory specimens dur ing the acute phase of infection.  Positive  results are indicative of active infection with SARS-CoV-2.  Clinical  correlation with patient history and other diagnostic information is  necessary to determine patient infection status.  Positive results do  not rule out bacterial infection or co-infection with other viruses. If result is PRESUMPTIVE POSTIVE SARS-CoV-2 nucleic acids MAY BE PRESENT.   A presumptive positive result was obtained on the submitted specimen  and confirmed on repeat testing.  While 2019 novel coronavirus  (SARS-CoV-2) nucleic acids may be present in the submitted sample  additional confirmatory testing may be necessary for epidemiological  and / or clinical management purposes  to differentiate between  SARS-CoV-2 and other Sarbecovirus currently known to infect humans.  If clinically indicated additional testing with an alternate test  methodology [phone]) is advised. The SARS-CoV-2 RNA is generally  detectable in upper and lower respiratory sp ecimens during the acute  phase of infection. The expected result is Negative. Fact Sheet for Patients:  StrictlyIdeas.no Fact Sheet for Healthcare  Providers: BankingDealers.co.za This test is not yet approved or cleared by the [place] FDA and has been authorized for detection and/or diagnosis of SARS-CoV-2 by FDA under an Emergency Use Authorization (EUA).  This EUA will remain in effect (meaning this test can be used) for the duration of the COVID-19 declaration under Section 564(b)(1) of the Act, 21 U.S.C. section 360bbb-3(b)(1), unless the authorization is terminated or revoked sooner. Performed at [facility], [place] [address], [place], [place] [address]   MRSA PCR Screening     Status: None   Collection Time: 04/15/19  7:40 PM   Specimen: Nasal Mucosa; Nasopharyngeal  Result Value Ref Range Status   MRSA by PCR NEGATIVE NEGATIVE Final    Comment:        The GeneXpert MRSA Assay (FDA approved  for NASAL specimens only), is one component of a comprehensive MRSA colonization surveillance program. It is not intended to diagnose MRSA infection nor to guide or monitor treatment for MRSA infections. Performed at [facility], [place] [address], [place], [place] [address]          Radiology Studies: No results found.      Scheduled Meds:  aspirin  300 mg Rectal Daily   Or   aspirin  325 mg Oral Daily   enoxaparin (LOVENOX) injection  40 mg Subcutaneous Q24H   insulin aspart  0-15 Units Subcutaneous TID WC   insulin aspart  0-5 Units Subcutaneous QHS   Continuous Infusions:  dextrose 75 mL/hr at 04/18/19 0857   valproate sodium Stopped (04/18/19 0708)   valproate sodium 500 mg (04/18/19 0859)     LOS: 3 days    Time spent: 35 mins.More than 50% of that time was spent in counseling and/or coordination of care.      Shelly Coss, MD [facility] Pager [phone]  If 7PM-7AM, please contact night-coverage [email or web address] Password [facility] 04/18/2019, 12:56 PM

## 2019-04-18 NOTE — Progress Notes (Signed)
Examined patient at bedside.  He wakes to voice and is non-verbal.  He nods yes and no to me.  He appears to have mildly increased work of breathing.  He is unable to eat/drink due to aspiration.  I received a call from William [name] [phone].  William [name] explained that William [name] has no family.  He has dated William [name] William [name] for over 40 years.  William [name] is 50.  William [name] and her sister William [name] are not his daughters but they are the closest thing he has to family.  I explained to William [name] that according to [facility] law if he has no spouse, no parents siblings or children then a person who knows him well and had his best interest at heart is his legal healthcare decision making surrogate.  William [name] agreed that her [name] would be that person.  We discussed the devastating effects of William [name]'s recent stroke.  He is unable to speak, stand, or eat.  We discussed the risk of aspiration and the potential for comfort feeding.  William [name] and her [name] felt certain that they want him to be fed in order to receive enjoyment.  They understand the risk of aspiration and accept it.  We talked about hospice house as he likely has less than 2 weeks to live.  William [name] and her [name] were supportive of moving him to hospice house.  They stated they know hospice will provide excellent care.  They want him to be as comfortable as possible.  And they want to be able to visit him.  Assessment: 73 year old male with history of advanced dementia, seizure, alcohol abuse, chronic kidney disease, diabetes, congestive heart failure, admitted with large MCA CVA.  Awake but unable to speak eat or perform any ADLs.  Recommendation: Progress to full comfort measures.  Orders will be changed to reflect comfort Social work order placed for referral to [facility].  [facility] William [name] and William [name] live.  William Jenny, PA-C Palliative Medicine Pager: [phone]  Time 25 min.

## 2019-04-18 NOTE — Progress Notes (Signed)
Attempted to reach [employer] at [phone], was unsuccessful.

## 2019-04-19 NOTE — TOC Transition Note (Signed)
Transition of Care [facility]) - CM/SW Discharge Note   Patient Details  Name: PHOENYX PAULSEN MRN: [id number] Date of Birth: 06-03-1946  Transition of Care [facility]) CM/SW Contact:  Geralynn Ochs, LCSW Phone Number: 04/19/2019, 3:05 PM   Clinical Narrative:   Nurse to call report to [phone]    Final next level of care: [place] Barriers to Discharge: Barriers Resolved   Patient Goals and CMS Choice        Discharge Placement                Patient to be transferred to facility by: [place] Name of family member notified: Lattie Haw Patient and family notified of of transfer: 04/19/19  Discharge Plan and Services                                     Social Determinants of Health (SDOH) Interventions     Readmission Risk Interventions No flowsheet data found.

## 2019-04-19 NOTE — Discharge Summary (Signed)
Physician Discharge Summary  William [name] LZJ:[id number] DOB: [birth year] DOA: 04/15/2019  PCP: Patient, No Pcp Per  Admit date: 04/15/2019 Discharge date: 04/19/2019  Admitted From: Home Disposition:  Home  Discharge Condition:Stable CODE STATUS:Comfort care Diet recommendation: regular  Brief/Interim Summary:  Patient is a 73 year old male from [facility] skilled [facility] facility with history of dementia, diabetes mellitus, seizure disorder, hypertension, hyperlipidemia, hearing impairment, depression/anxiety, ethanol dependence, CKD stage III who presented with altered mental status.  He was found to have left-sided hemiplegia and left-sided facial droop.  He was comatose on presentation and unable to provide any information.  MRI of the brain showed large right-sided ischemic infarct.  Neurology consulted.  Given the size of the stroke, palliative care was consulted regarding goals of discussion due to  poor prognosis.  After discussion with family, patient's care changed to comfort.  He is being discharged to residential hospice today.   Discharge Diagnoses:  Principal Problem:   CVA (cerebral vascular accident) [facility]) Active Problems:   Essential hypertension   CKD (chronic kidney disease) stage 4, GFR 15-29 ml/min (HCC)   Seizure (HCC)   Hyperlipidemia   Diabetes mellitus due to underlying condition, uncontrolled, with renal complication (HCC)   Pressure injury of skin   Acute on chronic congestive heart failure ([place])   Palliative care by specialist   Comfort measures only status    Discharge Instructions   Allergies as of 04/19/2019   No Known Allergies     Medication List    STOP taking these medications   busPIRone 5 MG tablet Commonly known as: BUSPAR   divalproex 500 MG DR tablet Commonly known as: DEPAKOTE   Ensure   folic acid 1 MG tablet Commonly known as: FOLVITE   insulin aspart 100 UNIT/ML injection Commonly known as: novoLOG    liraglutide 18 MG/3ML Sopn Commonly known as: VICTOZA   Lokelma 10 g Pack packet Generic drug: sodium zirconium cyclosilicate   multivitamin with minerals Tabs tablet   Pulmicort Flexhaler 90 MCG/ACT inhaler Generic drug: Budesonide   QUEtiapine 100 MG tablet Commonly known as: SEROQUEL   sertraline 100 MG tablet Commonly known as: ZOLOFT   Toujeo Max SoloStar 300 UNIT/ML Sopn Generic drug: Insulin Glargine       No Known Allergies  Consultations:  Neurology, palliative care   Procedures/Studies: Ct Code Stroke Cta Head W/wo Contrast  Result Date: 04/15/2019 CLINICAL DATA:  Left-sided weakness EXAM: CT ANGIOGRAPHY HEAD AND NECK CT PERFUSION BRAIN TECHNIQUE: Multidetector CT imaging of the head and neck was performed using the standard protocol during bolus administration of intravenous contrast. Multiplanar CT image reconstructions and MIPs were obtained to evaluate the vascular anatomy. Carotid stenosis measurements (when applicable) are obtained utilizing NASCET criteria, using the distal internal carotid diameter as the denominator. Multiphase CT imaging of the brain was performed following IV bolus contrast injection. Subsequent parametric perfusion maps were calculated using RAPID software. CONTRAST:  Dose is currently not available COMPARISON:  None. FINDINGS: The scanner has a malfunction such that images are being reviewed on TeraRecon rather than in pacs CTA NECK FINDINGS Aortic arch: No acute finding Right carotid system: Moderate atherosclerotic plaque mainly at the bifurcation. No ulceration or flow limiting stenosis. Left carotid system: Atherosclerotic plaque to a moderate degree, mainly at the bifurcation. No flow limiting stenosis or ulceration. Vertebral arteries: No proximal subclavian stenosis or ulceration. Moderate narrowing at the right vertebral origin due to atherosclerosis. Skeleton: Cervical spine degeneration with bulky ridging. Other neck: No acute  finding Upper chest: Patulous esophagus. Patchy air trapping in the apical lungs. Review of the MIP images confirms the above findings CTA HEAD FINDINGS Anterior circulation: Right M1 occlusion, expected based on prior head CT. The right A1 is patent. No additional embolism is seen. Poor collaterals at the arterial phase. Posterior circulation: Widely patent vertebral arteries. Mild-to-moderate atheromatous irregularity of the basilar. Extensive atherosclerotic narrowing of the bilateral posterior cerebral arteries with high-grade proximal stenoses. Venous sinuses: Not well assessed on this arterial study Anatomic variants: None significant Delayed phase: Not obtained Review of the MIP images confirms the above findings CT Brain Perfusion Findings: ASPECTS: 3 CBF (<30%) Volume: 114mL Perfusion (Tmax>6.0s) volume: 120mL Mismatch Volume: 29mL Infarction Location:Right MCA territory IMPRESSION: 1. 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 2. No flow limiting stenosis or embolic source seen in the right ICA. 3. Advanced atherosclerosis of the bilateral posterior cerebral arteries. There are poor collaterals along the cerebral convexities. Electronically Signed   By: Monte Fantasia M.D.   On: 04/15/2019 11:13   Dg Chest 2 View  Result Date: 04/15/2019 CLINICAL DATA:  Transient ischemic attack. EXAM: CHEST - 2 VIEW COMPARISON:  Radiograph December 24, 2018. FINDINGS: Stable cardiomediastinal silhouette. No pneumothorax or pleural effusion is noted. No acute pulmonary disease is noted. Old lower thoracic vertebral body fracture is noted. No acute bony abnormality is noted. IMPRESSION: No active cardiopulmonary disease. Electronically Signed   By: Marijo Conception M.D.   On: 04/15/2019 12:44   Ct Code Stroke Cta Neck W/wo Contrast  Result Date: 04/15/2019 CLINICAL DATA:  Left-sided weakness EXAM: CT ANGIOGRAPHY HEAD AND NECK CT PERFUSION  BRAIN TECHNIQUE: Multidetector CT imaging of the head and neck was performed using the standard protocol during bolus administration of intravenous contrast. Multiplanar CT image reconstructions and MIPs were obtained to evaluate the vascular anatomy. Carotid stenosis measurements (when applicable) are obtained utilizing NASCET criteria, using the distal internal carotid diameter as the denominator. Multiphase CT imaging of the brain was performed following IV bolus contrast injection. Subsequent parametric perfusion maps were calculated using RAPID software. CONTRAST:  Dose is currently not available COMPARISON:  None. FINDINGS: The scanner has a malfunction such that images are being reviewed on TeraRecon rather than in pacs CTA NECK FINDINGS Aortic arch: No acute finding Right carotid system: Moderate atherosclerotic plaque mainly at the bifurcation. No ulceration or flow limiting stenosis. Left carotid system: Atherosclerotic plaque to a moderate degree, mainly at the bifurcation. No flow limiting stenosis or ulceration. Vertebral arteries: No proximal subclavian stenosis or ulceration. Moderate narrowing at the right vertebral origin due to atherosclerosis. Skeleton: Cervical spine degeneration with bulky ridging. Other neck: No acute finding Upper chest: Patulous esophagus. Patchy air trapping in the apical lungs. Review of the MIP images confirms the above findings CTA HEAD FINDINGS Anterior circulation: Right M1 occlusion, expected based on prior head CT. The right A1 is patent. No additional embolism is seen. Poor collaterals at the arterial phase. Posterior circulation: Widely patent vertebral arteries. Mild-to-moderate atheromatous irregularity of the basilar. Extensive atherosclerotic narrowing of the bilateral posterior cerebral arteries with high-grade proximal stenoses. Venous sinuses: Not well assessed on this arterial study Anatomic variants: None significant Delayed phase: Not obtained Review of  the MIP images confirms the above findings CT Brain Perfusion Findings: ASPECTS: 3 CBF (<30%) Volume: 138mL Perfusion (Tmax>6.0s) volume: 183mL Mismatch Volume: 34mL Infarction Location:Right MCA territory IMPRESSION: 1. Right M1 occlusion with  large completed infarct in the right MCA territory based on CT perfusion and noncontrast head CT. 2. No flow limiting stenosis or embolic source seen in the right ICA. 3. Advanced atherosclerosis of the bilateral posterior cerebral arteries. There are poor collaterals along the cerebral convexities. Electronically Signed   By: Monte Fantasia M.D.   On: 04/15/2019 11:13   Mr Brain Wo Contrast  Result Date: 04/16/2019 CLINICAL DATA:  Follow-up examination for acute stroke, right M1 occlusion on prior CTA. EXAM: MRI HEAD WITHOUT CONTRAST TECHNIQUE: Multiplanar, multiecho pulse sequences of the brain and surrounding structures were obtained without intravenous contrast. COMPARISON:  Prior CT from 04/15/2019 FINDINGS: Brain: Examination moderately degraded by motion artifact. Generalized age-related cerebral atrophy with moderate chronic microvascular ischemic disease. Extensive restricted diffusion throughout essentially the entirety of the right MCA territory, compatible with large evolving acute right MCA territory infarct. Patchy right ACA involvement involving the parasagittal right frontal and parietal regions present as well. Associated edema throughout the right cerebral hemisphere with early regional mass effect. Right lateral ventricle partially effaced, with trace 2 mm right-to-left shift. No hydrocephalus or ventricular trapping. Basilar cisterns remain patent. Associated petechial hemorrhage throughout the right frontal and parietal lobes as well as the right basal ganglia seen on SWI sequence. No frank intraparenchymal hematoma or hemorrhagic transformation. No other evidence for acute or subacute ischemia. Gray-white matter differentiation otherwise maintained.  No other areas of chronic cortical infarction. No mass lesion. No extra-axial fluid collection. Pituitary gland suprasellar region within normal limits. Vascular: Major intracranial vascular flow voids maintained at the skull base. Known right M1 occlusion better seen on prior CTA. Skull and upper cervical spine: Craniocervical junction within normal limits. No focal marrow replacing lesion. Scalp soft tissues unremarkable. Sinuses/Orbits: Patient status post bilateral ocular lens replacement. Right gaze noted. Remote posttraumatic defect at the left lamina papyracea. Chronic right maxillary sinusitis. Trace opacity within the bilateral mastoid air cells, of doubtful significance. Other: None. IMPRESSION: 1. Large evolving acute ischemic right MCA/ACA territory infarcts involving the right cerebral hemisphere as above. Associated regional mass effect with early 2 mm right-to-left midline shift. No hydrocephalus or ventricular trapping. 2. Associated scattered petechial hemorrhage throughout the area of infarction without frank hemorrhagic transformation. 3. Underlying age-related cerebral atrophy with moderate chronic microvascular ischemic disease. Electronically Signed   By: Jeannine Boga M.D.   On: 04/16/2019 04:30   Ct Code Stroke Cta Cerebral Perfusion W/wo Contrast  Result Date: 04/15/2019 CLINICAL DATA:  Left-sided weakness EXAM: CT ANGIOGRAPHY HEAD AND NECK CT PERFUSION BRAIN TECHNIQUE: Multidetector CT imaging of the head and neck was performed using the standard protocol during bolus administration of intravenous contrast. Multiplanar CT image reconstructions and MIPs were obtained to evaluate the vascular anatomy. Carotid stenosis measurements (when applicable) are obtained utilizing NASCET criteria, using the distal internal carotid diameter as the denominator. Multiphase CT imaging of the brain was performed following IV bolus contrast injection. Subsequent parametric perfusion maps were  calculated using RAPID software. CONTRAST:  Dose is currently not available COMPARISON:  None. FINDINGS: The scanner has a malfunction such that images are being reviewed on TeraRecon rather than in pacs CTA NECK FINDINGS Aortic arch: No acute finding Right carotid system: Moderate atherosclerotic plaque mainly at the bifurcation. No ulceration or flow limiting stenosis. Left carotid system: Atherosclerotic plaque to a moderate degree, mainly at the bifurcation. No flow limiting stenosis or ulceration. Vertebral arteries: No proximal subclavian stenosis or ulceration. Moderate narrowing at the right vertebral origin due to atherosclerosis.  Skeleton: Cervical spine degeneration with bulky ridging. Other neck: No acute finding Upper chest: Patulous esophagus. Patchy air trapping in the apical lungs. Review of the MIP images confirms the above findings CTA HEAD FINDINGS Anterior circulation: Right M1 occlusion, expected based on prior head CT. The right A1 is patent. No additional embolism is seen. Poor collaterals at the arterial phase. Posterior circulation: Widely patent vertebral arteries. Mild-to-moderate atheromatous irregularity of the basilar. Extensive atherosclerotic narrowing of the bilateral posterior cerebral arteries with high-grade proximal stenoses. Venous sinuses: Not well assessed on this arterial study Anatomic variants: None significant Delayed phase: Not obtained Review of the MIP images confirms the above findings CT Brain Perfusion Findings: ASPECTS: 3 CBF (<30%) Volume: 180mL Perfusion (Tmax>6.0s) volume: 124mL Mismatch Volume: 107mL Infarction Location:Right MCA territory IMPRESSION: 1. 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 2. No flow limiting stenosis or embolic source seen in the right ICA. 3. Advanced atherosclerosis of the bilateral posterior cerebral arteries. There are poor collaterals along the cerebral  convexities. Electronically Signed   By: Monte Fantasia M.D.   On: 04/15/2019 11:13   Ct Head Code Stroke Wo Contrast  Result Date: 04/15/2019 CLINICAL DATA:  Code stroke. Left-sided weakness. Right facial droop EXAM: CT HEAD WITHOUT CONTRAST TECHNIQUE: Contiguous axial images were obtained from the base of the skull through the vertex without intravenous contrast. COMPARISON:  12/14/2018 FINDINGS: Brain: Cytotoxic edema throughout the large majority of the right MCA territory. High-density appearance of the right caudate head is attributed to preserved gray matter rather than hemorrhage. No acute hemorrhage is seen. Remote right occipital infarct. Chronic small vessel ischemia. No midline shift. Vascular: There may be hyperdense right M1 segment, which would be expected Skull: Negative Sinuses/Orbits: Chronic right maxillary sinusitis with near complete opacification. Other: These results were called by telephone at the time of interpretation on 04/15/2019 at 10:36 am to Dr. Rory Percy, who was already aware PACS is down at time of study and images were reviewed in TeraRecon ASPECTS [facility] Stroke Program Early CT Score) - Ganglionic level infarction (caudate, lentiform nuclei, internal capsule, insula, M1-M3 cortex): 1 - Supraganglionic infarction (M4-M6 cortex): 2 Total score (0-10 with 10 being normal): 3 IMPRESSION: 1. Large acute right MCA territory infarct.  ASPECTS is 3 at best. 2. No acute hemorrhage. Electronically Signed   By: Monte Fantasia M.D.   On: 04/15/2019 10:38       Subjective:  Patient seen and examined the bedside this morning.  Unresponsive.  Was not in any kind of distress.  Discharge Exam: Vitals:   04/18/19 1928 04/19/19 0753  BP: (!) 156/116 (!) 167/99  Pulse: 87 72  Resp: (!) 22 (!) 24  Temp: 98.2 F (36.8 C) 97.9 F (36.6 C)  SpO2: 93% 100%   Vitals:   04/18/19 1150 04/18/19 1636 04/18/19 1928 04/19/19 0753  BP: (!) 164/108 (!) 177/100 (!) 156/116 (!) 167/99   Pulse: 87 90 87 72  Resp: 19 (!) 22 (!) 22 (!) 24  Temp: (!) 97.2 F (36.2 C) 98 F (36.7 C) 98.2 F (36.8 C) 97.9 F (36.6 C)  TempSrc: Axillary Axillary Axillary Axillary  SpO2: 96% 96% 93% 100%  Weight:      Height:           The results of significant diagnostics from this hospitalization (including imaging, microbiology, ancillary and laboratory) are listed below for reference.     Microbiology: Recent Results (  from the past 240 hour(s))  SARS Coronavirus 2 (CEPHEID - Performed in [place] hospital lab), Hosp Order     Status: None   Collection Time: 04/15/19 11:01 AM   Specimen: Nasopharyngeal Swab  Result Value Ref Range Status   SARS Coronavirus 2 NEGATIVE NEGATIVE Final    Comment: (NOTE) If result is NEGATIVE SARS-CoV-2 target nucleic acids are NOT DETECTED. The SARS-CoV-2 RNA is generally detectable in upper and lower  respiratory specimens during the acute phase of infection. The lowest  concentration of SARS-CoV-2 viral copies this assay can detect is 250  copies / mL. A negative result does not preclude SARS-CoV-2 infection  and should not be used as the sole basis for treatment or other  patient management decisions.  A negative result may occur with  improper specimen collection / handling, submission of specimen other  than nasopharyngeal swab, presence of viral mutation(s) within the  areas targeted by this assay, and inadequate number of viral copies  (<250 copies / mL). A negative result must be combined with clinical  observations, patient history, and epidemiological information. If result is POSITIVE SARS-CoV-2 target nucleic acids are DETECTED. The SARS-CoV-2 RNA is generally detectable in upper and lower  respiratory specimens dur ing the acute phase of infection.  Positive  results are indicative of active infection with SARS-CoV-2.  Clinical  correlation with patient history and other diagnostic information is  necessary to determine  patient infection status.  Positive results do  not rule out bacterial infection or co-infection with other viruses. If result is PRESUMPTIVE POSTIVE SARS-CoV-2 nucleic acids MAY BE PRESENT.   A presumptive positive result was obtained on the submitted specimen  and confirmed on repeat testing.  While 2019 novel coronavirus  (SARS-CoV-2) nucleic acids may be present in the submitted sample  additional confirmatory testing may be necessary for epidemiological  and / or clinical management purposes  to differentiate between  SARS-CoV-2 and other Sarbecovirus currently known to infect humans.  If clinically indicated additional testing with an alternate test  methodology [phone]) is advised. The SARS-CoV-2 RNA is generally  detectable in upper and lower respiratory sp ecimens during the acute  phase of infection. The expected result is Negative. Fact Sheet for Patients:  StrictlyIdeas.no Fact Sheet for Healthcare Providers: BankingDealers.co.za This test is not yet approved or cleared by the [place] FDA and has been authorized for detection and/or diagnosis of SARS-CoV-2 by FDA under an Emergency Use Authorization (EUA).  This EUA will remain in effect (meaning this test can be used) for the duration of the COVID-19 declaration under Section 564(b)(1) of the Act, 21 U.S.C. section 360bbb-3(b)(1), unless the authorization is terminated or revoked sooner. Performed at [facility], [place] [address], [place], [place] [address]   MRSA PCR Screening     Status: None   Collection Time: 04/15/19  7:40 PM   Specimen: Nasal Mucosa; Nasopharyngeal  Result Value Ref Range Status   MRSA by PCR NEGATIVE NEGATIVE Final    Comment:        The GeneXpert MRSA Assay (FDA approved for NASAL specimens only), is one component of a comprehensive MRSA colonization surveillance program. It is not intended to diagnose MRSA infection nor to guide  or monitor treatment for MRSA infections. Performed at [facility], [place] [address] [place], [place] [address]      Labs: BNP (last 3 results) Recent Labs    12/23/18 0326  BNP 846.9*   Basic Metabolic  Panel: Recent Labs  Lab 04/15/19 0952 04/15/19 1010 04/18/19 0544  NA 139 139 146*  K 5.1 4.7 4.1  CL 104 105 114*  CO2 22  --  22  GLUCOSE 189* 183* 191*  BUN 30* 30* 21  CREATININE 2.18* 2.00* 1.88*  CALCIUM 9.5  --  8.9   Liver Function Tests: Recent Labs  Lab 04/15/19 0952  AST 22  ALT 19  ALKPHOS 59  BILITOT 1.1  PROT 6.8  ALBUMIN 3.8   No results for input(s): LIPASE, AMYLASE in the last 168 hours. No results for input(s): AMMONIA in the last 168 hours. CBC: Recent Labs  Lab 04/15/19 0952 04/15/19 1010  WBC 8.6  --   NEUTROABS 7.2  --   HGB 14.3 15.3  HCT 45.2 45.0  MCV 89.3  --   PLT 256  --    Cardiac Enzymes: No results for input(s): CKTOTAL, CKMB, CKMBINDEX, TROPONINI in the last 168 hours. BNP: Invalid input(s): POCBNP CBG: Recent Labs  Lab 04/17/19 2319 04/18/19 0355 04/18/19 0845 04/18/19 1142 04/18/19 1644  GLUCAP 150* 162* 172* 179* 184*   D-Dimer No results for input(s): DDIMER in the last 72 hours. Hgb A1c No results for input(s): HGBA1C in the last 72 hours. Lipid Profile No results for input(s): CHOL, HDL, LDLCALC, TRIG, CHOLHDL, LDLDIRECT in the last 72 hours. Thyroid function studies No results for input(s): TSH, T4TOTAL, T3FREE, THYROIDAB in the last 72 hours.  Invalid input(s): FREET3 Anemia work up No results for input(s): VITAMINB12, FOLATE, FERRITIN, TIBC, IRON, RETICCTPCT in the last 72 hours. Urinalysis    Component Value Date/Time   COLORURINE AMBER (A) 04/15/2019 1429   APPEARANCEUR TURBID (A) 04/15/2019 1429   LABSPEC 1.044 (H) 04/15/2019 1429   PHURINE 5.0 04/15/2019 1429   GLUCOSEU 50 (A) 04/15/2019 1429   GLUCOSEU 100 (A) 05/17/2009 2115   HGBUR MODERATE (A) 04/15/2019 1429   BILIRUBINUR  NEGATIVE 04/15/2019 1429   KETONESUR 20 (A) 04/15/2019 1429   PROTEINUR 100 (A) 04/15/2019 1429   UROBILINOGEN 1.0 05/04/2015 0418   NITRITE NEGATIVE 04/15/2019 1429   LEUKOCYTESUR MODERATE (A) 04/15/2019 1429   Sepsis Labs Invalid input(s): PROCALCITONIN,  WBC,  LACTICIDVEN Microbiology Recent Results (from the past 240 hour(s))  SARS Coronavirus 2 (CEPHEID - Performed in [place] hospital lab), Hosp Order     Status: None   Collection Time: 04/15/19 11:01 AM   Specimen: Nasopharyngeal Swab  Result Value Ref Range Status   SARS Coronavirus 2 NEGATIVE NEGATIVE Final    Comment: (NOTE) If result is NEGATIVE SARS-CoV-2 target nucleic acids are NOT DETECTED. The SARS-CoV-2 RNA is generally detectable in upper and lower  respiratory specimens during the acute phase of infection. The lowest  concentration of SARS-CoV-2 viral copies this assay can detect is 250  copies / mL. A negative result does not preclude SARS-CoV-2 infection  and should not be used as the sole basis for treatment or other  patient management decisions.  A negative result may occur with  improper specimen collection / handling, submission of specimen other  than nasopharyngeal swab, presence of viral mutation(s) within the  areas targeted by this assay, and inadequate number of viral copies  (<250 copies / mL). A negative result must be combined with clinical  observations, patient history, and epidemiological information. If result is POSITIVE SARS-CoV-2 target nucleic acids are DETECTED. The SARS-CoV-2 RNA is generally detectable in upper and lower  respiratory specimens dur ing the acute phase of infection.  Positive  results are indicative of active infection with SARS-CoV-2.  Clinical  correlation with patient history and other diagnostic information is  necessary to determine patient infection status.  Positive results do  not rule out bacterial infection or co-infection with other viruses. If result is  PRESUMPTIVE POSTIVE SARS-CoV-2 nucleic acids MAY BE PRESENT.   A presumptive positive result was obtained on the submitted specimen  and confirmed on repeat testing.  While 2019 novel coronavirus  (SARS-CoV-2) nucleic acids may be present in the submitted sample  additional confirmatory testing may be necessary for epidemiological  and / or clinical management purposes  to differentiate between  SARS-CoV-2 and other Sarbecovirus currently known to infect humans.  If clinically indicated additional testing with an alternate test  methodology [phone]) is advised. The SARS-CoV-2 RNA is generally  detectable in upper and lower respiratory sp ecimens during the acute  phase of infection. The expected result is Negative. Fact Sheet for Patients:  StrictlyIdeas.no Fact Sheet for Healthcare Providers: BankingDealers.co.za This test is not yet approved or cleared by the [place] FDA and has been authorized for detection and/or diagnosis of SARS-CoV-2 by FDA under an Emergency Use Authorization (EUA).  This EUA will remain in effect (meaning this test can be used) for the duration of the COVID-19 declaration under Section 564(b)(1) of the Act, 21 U.S.C. section 360bbb-3(b)(1), unless the authorization is terminated or revoked sooner. Performed at [facility], [place] [address] [place], [place] [address]   MRSA PCR Screening     Status: None   Collection Time: 04/15/19  7:40 PM   Specimen: Nasal Mucosa; Nasopharyngeal  Result Value Ref Range Status   MRSA by PCR NEGATIVE NEGATIVE Final    Comment:        The GeneXpert MRSA Assay (FDA approved for NASAL specimens only), is one component of a comprehensive MRSA colonization surveillance program. It is not intended to diagnose MRSA infection nor to guide or monitor treatment for MRSA infections. Performed at [facility], [place] [address], [place], [place] [address]      Please note: You were cared for by a hospitalist during your hospital stay. Once you are discharged, your primary care physician will handle any further medical issues. Please note that NO REFILLS for any discharge medications will be authorized once you are discharged, as it is imperative that you return to your primary care physician (or establish a relationship with a primary care physician if you do not have one) for your post hospital discharge needs so that they can reassess your need for medications and monitor your lab values.    Time coordinating discharge: 40 minutes  SIGNED:   Shelly Coss, MD  [facility] 04/19/2019, 1:41 PM Pager [phone]  If 7PM-7AM, please contact night-coverage [email or web address] Password [email or web address]

## 2019-04-19 NOTE — Progress Notes (Signed)
Nutrition Brief Note  Chart reviewed. Pt is full comfort care.  No further nutrition interventions warranted at this time.  Please consult as needed.   Corrin Parker, MS, RD, LDN Pager # [phone] After hours/ weekend pager # [phone]

## 2019-04-19 NOTE — Progress Notes (Addendum)
Manufacturing engineer [facility]) Hospice  Received referral for [facility] from [place].  [facility] will have a bed to offer Mr. Gheen.    His NOK is his SO [place] of >40 years.  Spoke to her daughter Jeanell Sparrow says her mother cannot be disturbed until this evening.  Discussed necessary consents with Lattie Haw for transfer and she is not sure she wants to sign and request that he move without that, which is prohibited by Medicare.  She will review and get back to me if she is comfortable signing on behalf of her mother.  Will update once decision made.  Venia Carbon RN, BSN, [facility] Liaison (in [place]) [phone]  **1245, Lattie Haw and Izora Gala completed necessary paperwork. [facility] is ready for Mr. Zappone at any time.  Updated Kathlee Nations.  RN staff:  Please call report to [phone], room assignment will be determined at that time  Please fax d/c summary to:  [phone]

## 2019-04-19 NOTE — Progress Notes (Signed)
Daily Progress Note   Patient Name: William [name]       Date: 04/19/2019 DOB: Jul 15, 1946  Age: 73 y.o. MRN#: [id number] Attending Physician: Shelly Coss, MD Primary Care Physician: Patient, No Pcp Per Admit Date: 04/15/2019  Reason for Consultation/Follow-up: Terminal Care  Subjective: Weak appearing elderly gentleman resting in bed.  At times, he appears to have nonverbal gestures of distress/discomfort.  Does not verbalize.  Does not follow commands.  Length of Stay: 4  Current Medications: Scheduled Meds:    Continuous Infusions: . valproate sodium Stopped (04/19/19 0002)  . valproate sodium 500 mg (04/19/19 0938)    PRN Meds: acetaminophen **OR** acetaminophen (TYLENOL) oral liquid 160 mg/5 mL **OR** acetaminophen, antiseptic oral rinse, glycopyrrolate **OR** glycopyrrolate **OR** glycopyrrolate, haloperidol **OR** haloperidol **OR** haloperidol lactate, haloperidol lactate, HYDROmorphone (DILAUDID) injection, ondansetron **OR** ondansetron (ZOFRAN) IV, polyvinyl alcohol, senna-docusate  Physical Exam         Patient appears debilitated, chronically ill-appearing Shallow breath sounds Left-sided facial droop Generalized weakness, dense weakness on left side No edema Opens eyes but does not follow commands does not verbalize S1-S2  Vital Signs: BP (!) 167/99 (BP Location: Left Arm)   Pulse 72   Temp 97.9 F (36.6 C) (Axillary)   Resp (!) 24   Ht 6' (1.829 m)   Wt 90.7 kg   SpO2 100%   BMI 27.12 kg/m  SpO2: SpO2: 100 % O2 Device: O2 Device: Nasal Cannula O2 Flow Rate: O2 Flow Rate (L/min): 3 L/min  Intake/output summary:   Intake/Output Summary (Last 24 hours) at 04/19/2019 1151 Last data filed at 04/19/2019 0940 Gross per 24 hour  Intake 505.98 ml   Output 450 ml  Net 55.98 ml   LBM: Last BM Date: (PTA) Baseline Weight: Weight: 90.7 kg Most recent weight: Weight: 90.7 kg Medication history noted.  Patient is on Dilaudid, Depacon, Robinul, Haldol.      Palliative Assessment/Data:      Patient Active Problem List   Diagnosis Date Noted  . Comfort measures only status   . Pressure injury of skin 04/16/2019  . Acute on chronic congestive heart failure ([place])   . Palliative care by specialist   . CVA (cerebral vascular accident) ([place]) 04/15/2019  . Palliative care encounter   . Altered mental status, unspecified 12/14/2018  . Elevated troponin   .  Smokeless tobacco use 12/31/2017  . Dyspepsia 10/20/2017  . Dietary noncompliance 10/20/2017  . COPD mixed type ([place]) 02/17/2017  . Diabetes mellitus due to underlying condition, uncontrolled, with renal complication ([place]) 25/42/7062  . Hyperlipidemia 03/17/2016  . Abnormality of gait 01/14/2016  . Psychosis ([place]) 01/03/2016  . Seizure ([place]) 12/30/2015  . Iron deficiency anemia   . Acute kidney injury superimposed on chronic kidney disease ([place]) 02/26/2014  . CKD (chronic kidney disease) stage 4, GFR 15-29 ml/min ([facility]) 02/26/2014  . Urinary incontinence 02/10/2013  . History of alcohol abuse 12/03/2009  . Anxiety state 09/30/2006  . Depression 09/30/2006  . Hearing impairment 09/30/2006  . Essential hypertension 09/30/2006    Palliative Care Assessment & Plan   Patient Profile:    Assessment:  73 year old male with history of advanced dementia, seizure, alcohol abuse, chronic kidney disease, diabetes, congestive heart failure, admitted with large MCA CVA.  Awake but unable to speak eat or perform any ADLs.   Recommendations/Plan:  Comfort care medications noted.  Continue Dilaudid, Depacon, Robinul, Haldol as needed  Call placed and discussed with hospice.  Patient does have a bed available today and likely will be moved to a residential hospice facility for  aggressive symptom management, end-of-life care following her large devastating stroke.  Call placed and discussed with next of kin Caryl Pina over the phone at [phone].  Lattie Haw is the daughter of the patient's significant other Ms. Izora Gala of 40 years or so.  Ms. Lattie Haw had further questions about the patient's swallowing, patient's nutritional status, scope of artificial nutrition/hydration, extent of the stroke and prognosis overall.  We reviewed the patient's current condition.  Brain imaging reviewed.  Patient has a large dense evolving stroke.  Discussed with her about brain imaging results.  Discussed with her about speech-language pathology evaluation and their final recommendations.  Discussed frankly but compassionately that the patient has suffered a large right-sided ischemic infarct.  It does not appear that there will be any meaningful recovery.  It appears that the patient will continue to have a poor quality of life.  Reiterated her previous decision of not doing tube feedings or PEG because of unrecoverable extent of stroke and poor quality of life.  Endorsed her previously made decision of focusing on comfort measures and transition to residential hospice for full scope of comfort measures.  Discussed concept of comfort feeding: Oral care, not eating to replenish calories or gain nutrition, only eating consistencies such as Jell-O pudding applesauce or ice cream only when the patient is most alert awake oriented.  Discussed about silent aspiration in detail.  Discussed about the concept of nutrition in the context of the patient's severe debilitating stroke.  Overall, plan remains the same which is to focus on comfort measures and transition to residential hospice.  All of the family's questions addressed to the best of my ability.  Goals of Care and Additional Recommendations:  Limitations on Scope of Treatment: Full Comfort Care  Code Status:    Code Status Orders  (From admission,  onward)         Start     Ordered   04/18/19 1704  Do not attempt resuscitation (DNR)  Continuous    Question Answer Comment  In the event of cardiac or respiratory ARREST Do not call a "code blue"   In the event of cardiac or respiratory ARREST Do not perform Intubation, CPR, defibrillation or ACLS   In the event of cardiac or respiratory ARREST Use medication by  any route, position, wound care, and other measures to relive pain and suffering. May use oxygen, suction and manual treatment of airway obstruction as needed for comfort.      04/18/19 1705        Code Status History    Date Active Date Inactive Code Status Order ID Comments User Context   04/15/2019 1159 04/18/2019 1705 DNR [id number]  Karmen Bongo, MD ED   12/16/2018 1620 12/24/2018 2236 DNR [id number]  Fuller Canada, PA-C Inpatient   12/14/2018 1748 12/16/2018 1620 Full Code [id number]  Aline August, MD ED   12/30/2015 1639 01/02/2016 1836 Full Code [id number]  Collier Salina, MD Inpatient   11/20/2015 0529 11/21/2015 2228 Full Code [id number]  Bethena Roys, MD ED   10/10/2015 1959 10/11/2015 1525 Full Code [id number]  Harvel Quale, MD ED   05/04/2015 1134 05/07/2015 2052 Full Code [id number]  Bethena Roys, MD Inpatient   02/26/2014 0036 03/01/2014 0813 Full Code [id number]  Olga Millers, MD Inpatient   12/15/2013 0439 12/17/2013 2112 Full Code [id number]  Charlann Lange, MD Inpatient   09/03/2012 0044 09/04/2012 1839 Full Code [id number]  Mackie Pai., MD ED   08/21/2012 0425 08/22/2012 1910 Full Code [id number]  Hester Mates, MD Inpatient   Advance Care Planning Activity    Advance Directive Documentation     Most Recent Value  Type of Advance Directive  Out of facility DNR (pink MOST or yellow form)  Pre-existing out of facility DNR order (yellow form or pink MOST form)  -  "MOST" Form in Place?  -       Prognosis:   < 2 weeks  Discharge Planning:  Hospice facility  Care plan was discussed  with next of kin Louanna Raw over the phone.  Also discussed with hospice liaison.   Thank you for allowing the Palliative Medicine Team to assist in the care of this patient.   Time In: 11 Time Out: 11.35 Total Time 35 Prolonged Time Billed  no       Greater than 50%  of this time was spent counseling and coordinating care related to the above assessment and plan.  Loistine Chance, MD [phone] Please contact Palliative Medicine Team phone at [phone] for questions and concerns.

## 2019-04-20 ENCOUNTER — Ambulatory Visit: Payer: Self-pay | Admitting: Neurology

## 2019-04-30 DEATH — deceased

## 2019-08-19 IMAGING — CT CT HEAD WITHOUT CONTRAST
4 series · 16 of 47 positions shown, 18 images · non-contrast
Comparison: 12/30/2015

CLINICAL DATA: Altered mental status as patient found unresponsive
in bed.

EXAM:
CT HEAD WITHOUT CONTRAST
TECHNIQUE: Contiguous axial images were obtained from the base of the skull
through the vertex without intravenous contrast.

[Series 3: head without · axial · non-contrast · 0.41mm/px · z∈[+1261,+1381]mm · 7 of 34 slices shown, 9 images]
[im 5/34  brain]
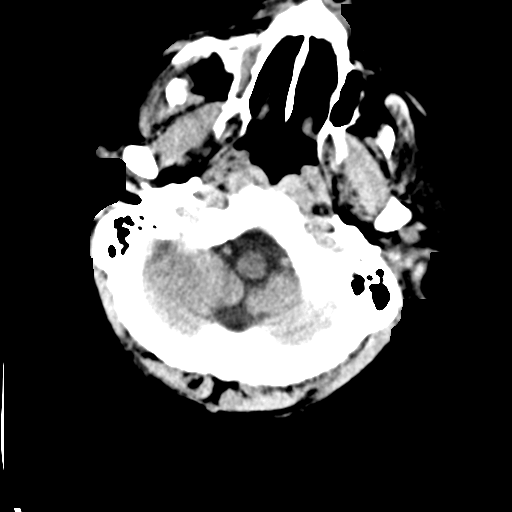
[im 5/34  bone]
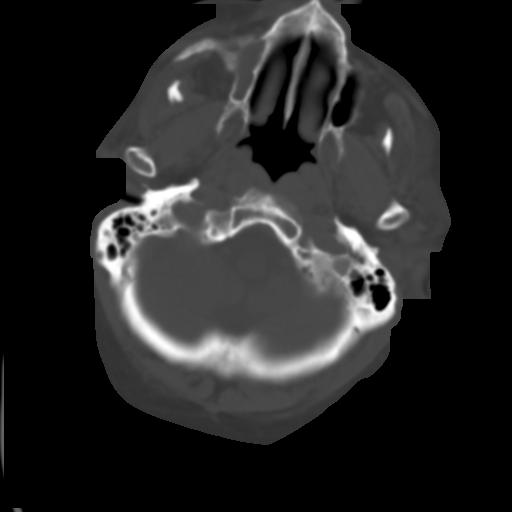
[im 9/34  brain]
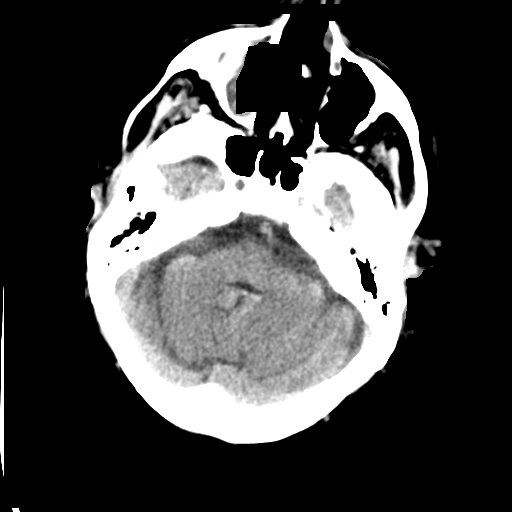
[im 13/34  brain]
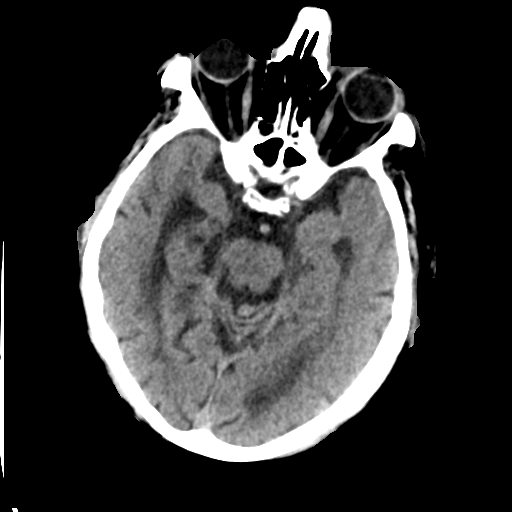
[im 17/34  brain]
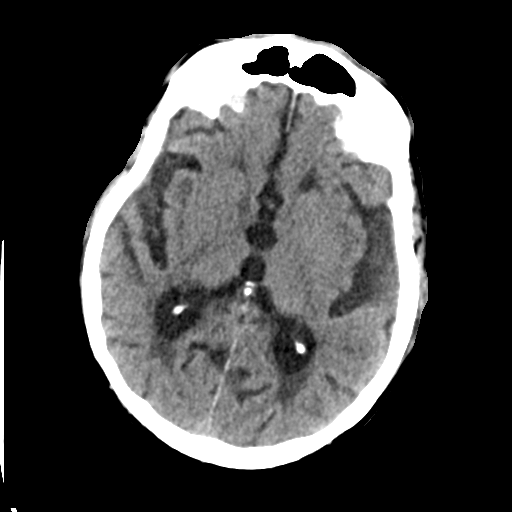
[im 21/34  brain]
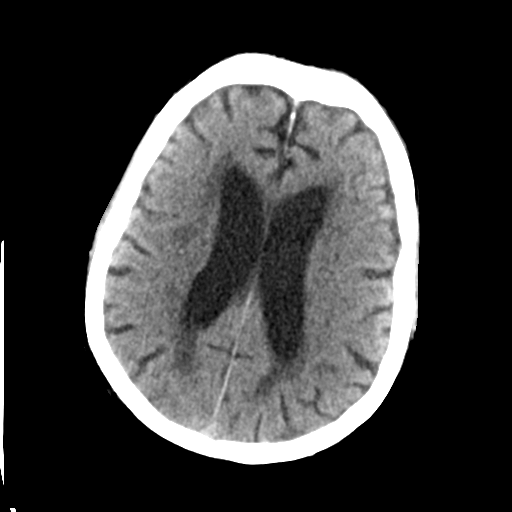
[im 21/34  bone]
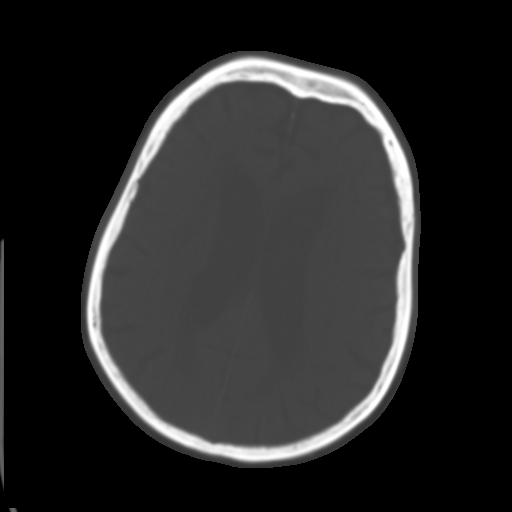
[im 25/34  brain]
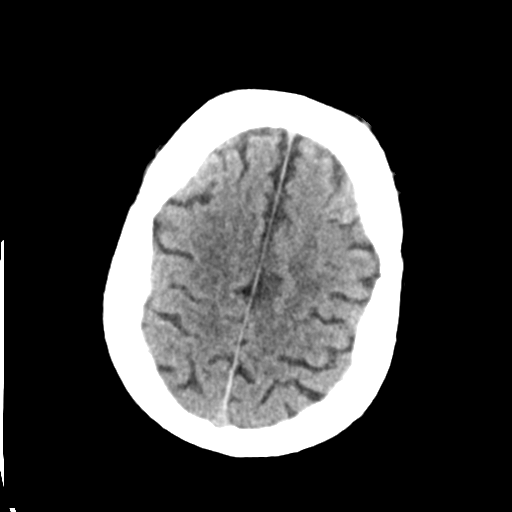
[im 29/34  brain]
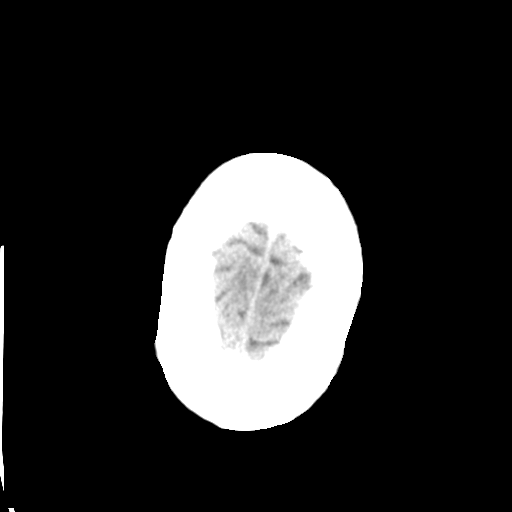

[Series 4: head bone · axial · 0.41mm/px · z∈[+1257,+1289]mm · 3 of 83 slices shown]
[im 9/83  bone]
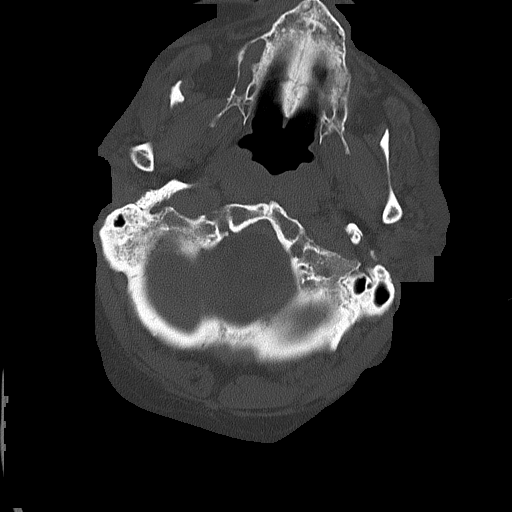
[im 17/83  bone]
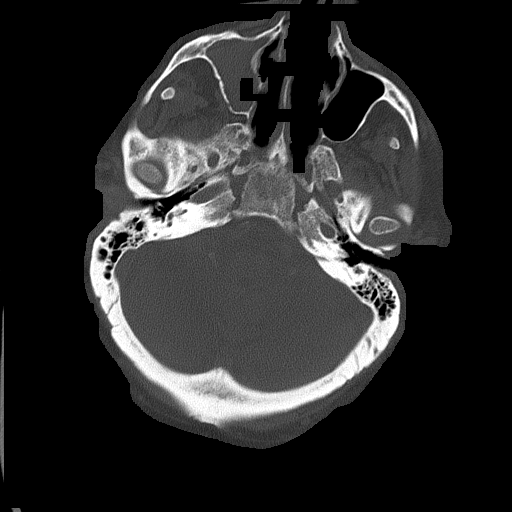
[im 25/83  bone]
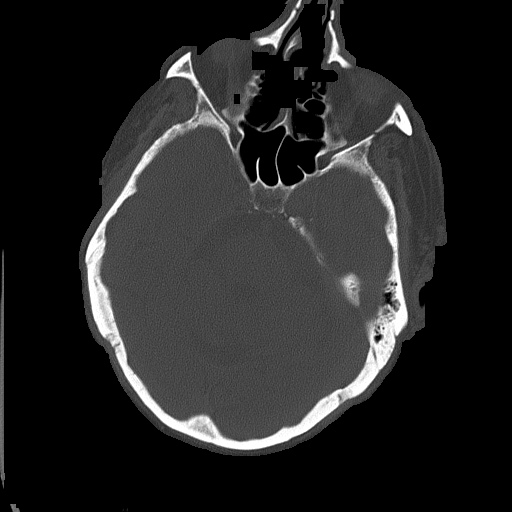

[Series 5: head without cor · coronal · non-contrast · 0.29mm/px · 3 of 66 slices shown]
[im 22/66  brain]
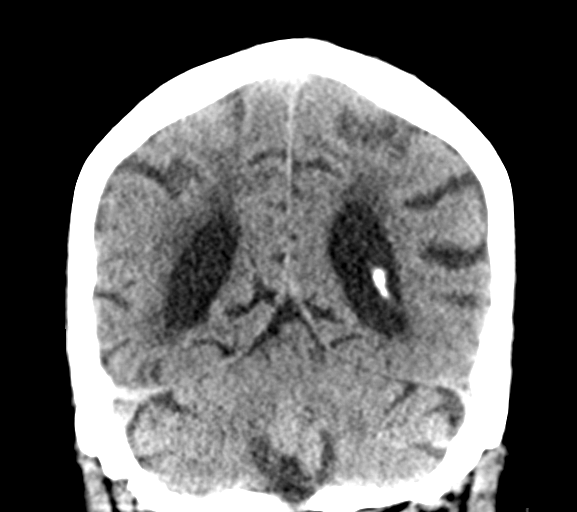
[im 29/66  brain]
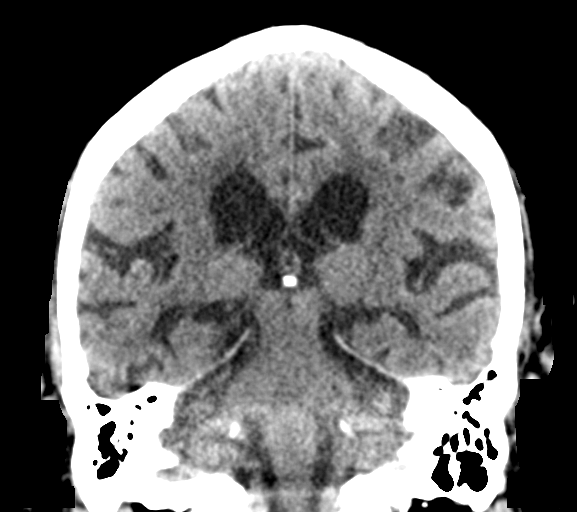
[im 37/66  brain]
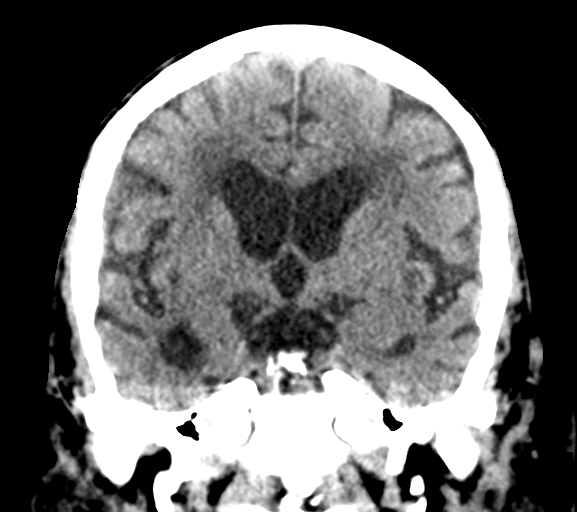

[Series 6: head without sag · sagittal · non-contrast · 0.27mm/px · 3 of 56 slices shown]
[im 19/56  brain]
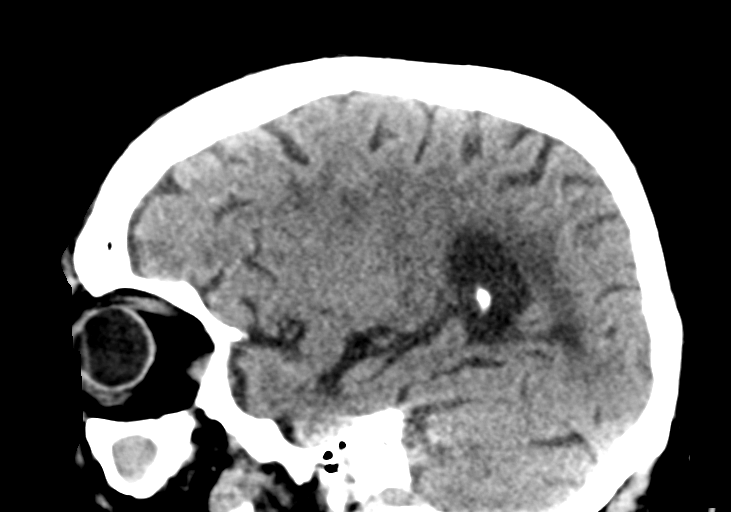
[im 28/56  brain]
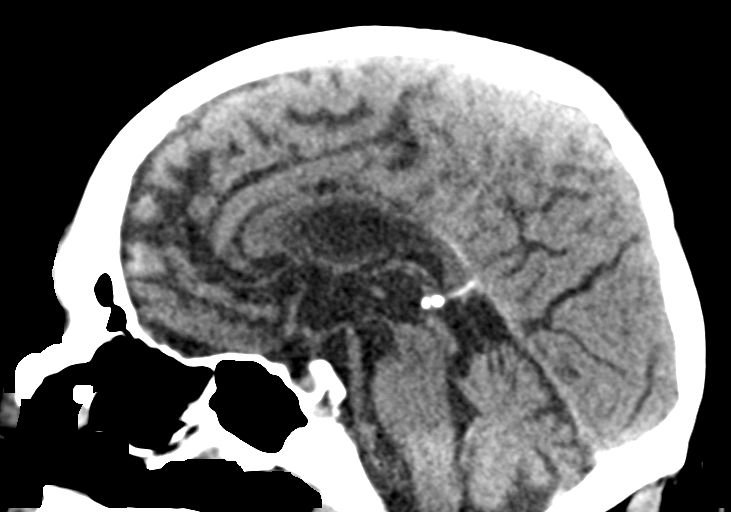
[im 37/56  brain]
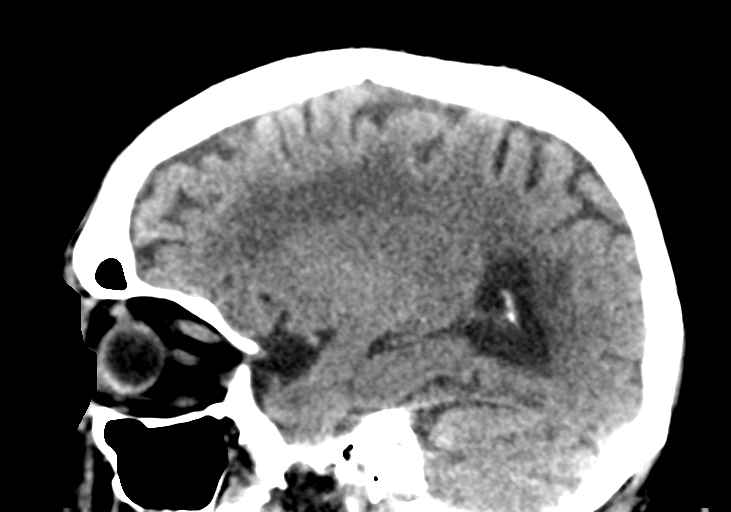

[16 of 47 positions shown; findings below may reference images not displayed]

FINDINGS: Brain: Ventricles, cisterns and other CSF spaces are unchanged.
There is mild age related atrophic change. There is chronic ischemic
microvascular disease. There is no mass, mass effect, shift of
midline structures or acute hemorrhage. No evidence of acute
infarction.

Vascular: No hyperdense vessel or unexpected calcification.

Skull: Suggestion old nasal bone fracture.

Sinuses/Orbits: Orbits are within normal. Complete opacification
over the right maxillary sinus slightly worse.

Other: None.
IMPRESSION: No acute findings.

Chronic ischemic microvascular disease and age related atrophic
change.

Moderate chronic sinus inflammatory change of the right maxillary
sinus slightly worse.

Findings suggesting old nasal bone fracture.

## 2019-08-28 IMAGING — DX PORTABLE CHEST - 1 VIEW
1 series · 1 of 1 positions shown · non-contrast
Comparison: 12/22/2018

CLINICAL DATA: Shortness of breath.

EXAM:
PORTABLE CHEST 1 VIEW

[chest]
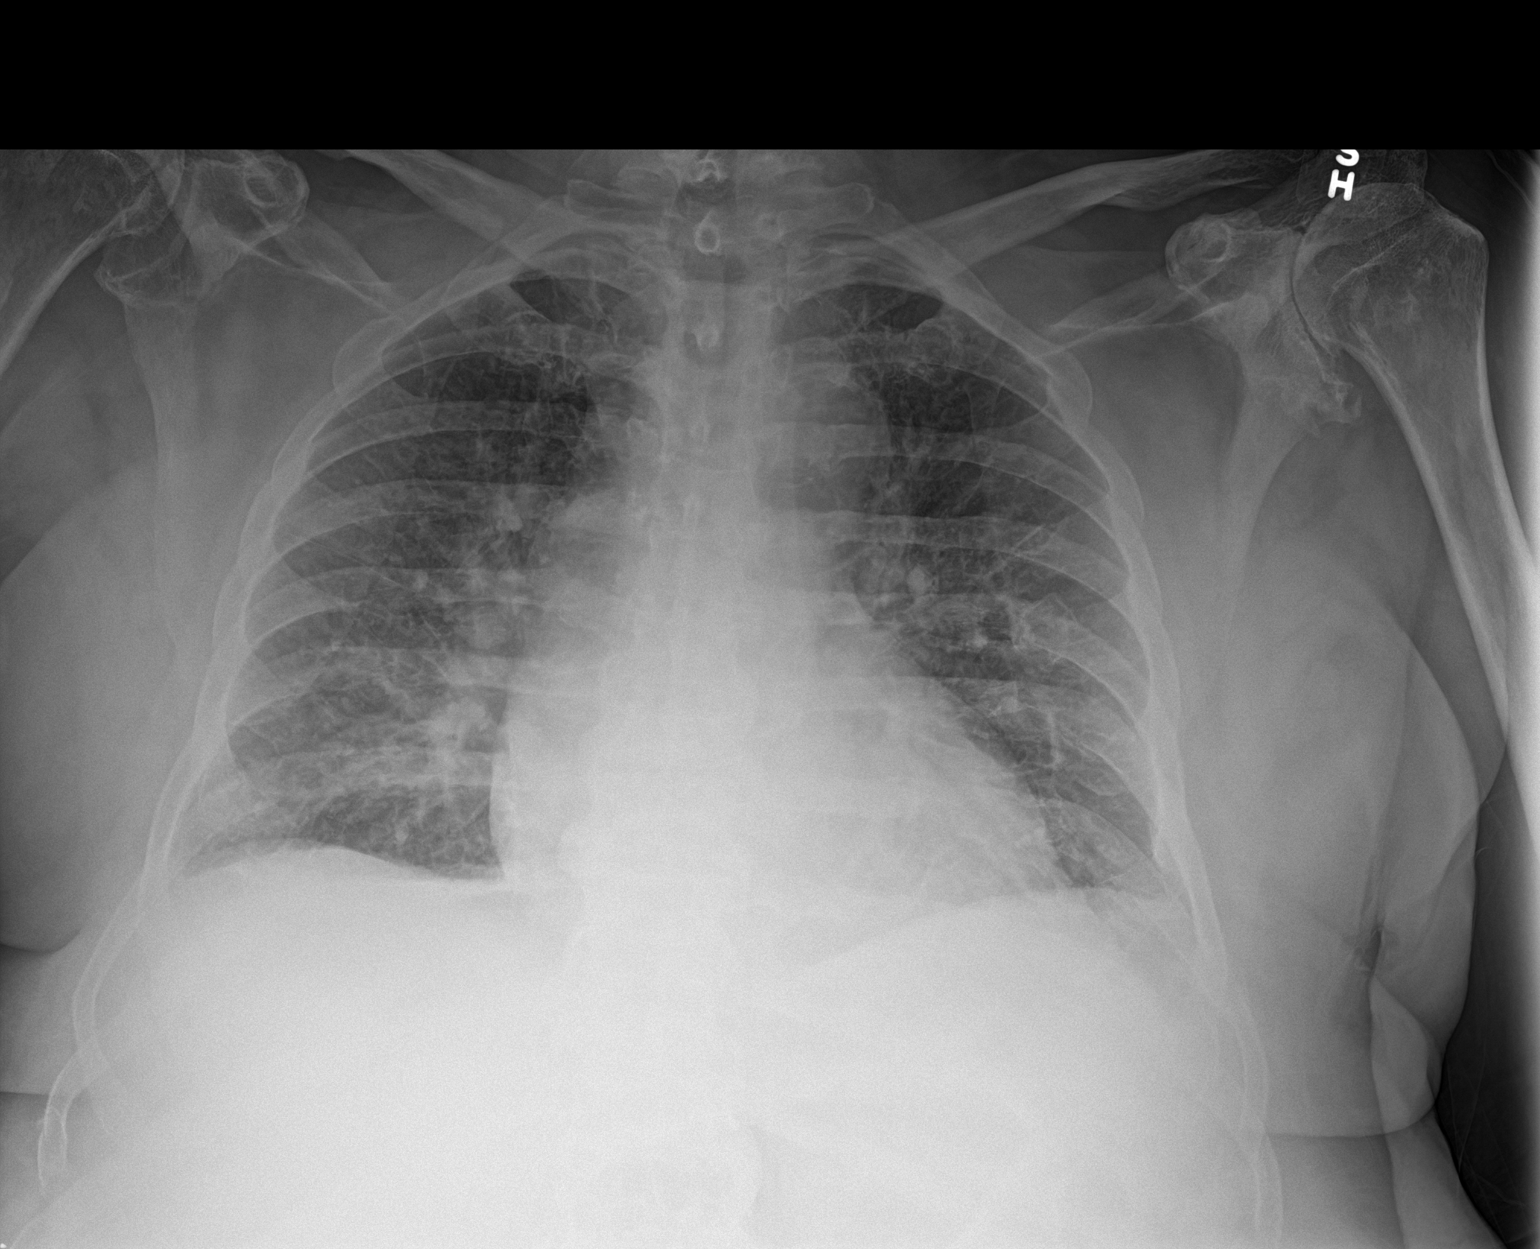

[1 of 1 positions shown; findings below may reference images not displayed]

FINDINGS: Cardiomediastinal silhouette is stable. Central vascular markings
remain mildly enlarged and prominent. Slightly prominent
interstitial lung markings are unchanged. Negative for a
pneumothorax. Extensive degenerative changes in the lower cervical
spine and left shoulder.
IMPRESSION: Chest radiograph findings have minimally changed. Central vascular
structures and interstitial lung markings remain mildly prominent.
Findings could represent vascular congestion or mild edema.

## 2019-12-28 ENCOUNTER — Telehealth: Payer: Self-pay

## 2019-12-28 NOTE — Telephone Encounter (Signed)
cologuard recall letter has been mailed to patients home address on file.
